# Patient Record
Sex: Female | Born: 1970 | Race: White | Hispanic: No | Marital: Married | Smoking: Former smoker
Health system: Southern US, Community
[De-identification: ages and names within clinical notes are randomized; demographics above are authoritative.]

## PROBLEM LIST (undated history)

## (undated) DIAGNOSIS — F419 Anxiety disorder, unspecified: Secondary | ICD-10-CM

## (undated) DIAGNOSIS — R682 Dry mouth, unspecified: Secondary | ICD-10-CM

## (undated) DIAGNOSIS — H04123 Dry eye syndrome of bilateral lacrimal glands: Secondary | ICD-10-CM

## (undated) DIAGNOSIS — K529 Noninfective gastroenteritis and colitis, unspecified: Secondary | ICD-10-CM

## (undated) DIAGNOSIS — M329 Systemic lupus erythematosus, unspecified: Secondary | ICD-10-CM

## (undated) DIAGNOSIS — I7300 Raynaud's syndrome without gangrene: Secondary | ICD-10-CM

## (undated) DIAGNOSIS — M6740 Ganglion, unspecified site: Secondary | ICD-10-CM

## (undated) DIAGNOSIS — G5600 Carpal tunnel syndrome, unspecified upper limb: Secondary | ICD-10-CM

## (undated) DIAGNOSIS — E785 Hyperlipidemia, unspecified: Secondary | ICD-10-CM

## (undated) DIAGNOSIS — D219 Benign neoplasm of connective and other soft tissue, unspecified: Secondary | ICD-10-CM

## (undated) DIAGNOSIS — F5002 Anorexia nervosa, binge eating/purging type: Secondary | ICD-10-CM

## (undated) DIAGNOSIS — Z9889 Other specified postprocedural states: Secondary | ICD-10-CM

## (undated) DIAGNOSIS — R112 Nausea with vomiting, unspecified: Secondary | ICD-10-CM

## (undated) DIAGNOSIS — F988 Other specified behavioral and emotional disorders with onset usually occurring in childhood and adolescence: Secondary | ICD-10-CM

## (undated) DIAGNOSIS — F32A Depression, unspecified: Secondary | ICD-10-CM

## (undated) DIAGNOSIS — IMO0002 Reserved for concepts with insufficient information to code with codable children: Secondary | ICD-10-CM

## (undated) DIAGNOSIS — H33319 Horseshoe tear of retina without detachment, unspecified eye: Secondary | ICD-10-CM

## (undated) DIAGNOSIS — S83209A Unspecified tear of unspecified meniscus, current injury, unspecified knee, initial encounter: Secondary | ICD-10-CM

## (undated) DIAGNOSIS — T7840XA Allergy, unspecified, initial encounter: Secondary | ICD-10-CM

## (undated) DIAGNOSIS — D649 Anemia, unspecified: Secondary | ICD-10-CM

## (undated) DIAGNOSIS — J45909 Unspecified asthma, uncomplicated: Secondary | ICD-10-CM

## (undated) DIAGNOSIS — F50029 Anorexia nervosa, binge eating/purging type, unspecified: Secondary | ICD-10-CM

## (undated) HISTORY — DX: Horseshoe tear of retina without detachment, unspecified eye: H33.319

## (undated) HISTORY — DX: Carpal tunnel syndrome, unspecified upper limb: G56.00

## (undated) HISTORY — DX: Unspecified asthma, uncomplicated: J45.909

## (undated) HISTORY — DX: Systemic lupus erythematosus, unspecified: M32.9

## (undated) HISTORY — DX: Unspecified tear of unspecified meniscus, current injury, unspecified knee, initial encounter: S83.209A

## (undated) HISTORY — DX: Dry eye syndrome of bilateral lacrimal glands: H04.123

## (undated) HISTORY — DX: Other specified behavioral and emotional disorders with onset usually occurring in childhood and adolescence: F98.8

## (undated) HISTORY — DX: Hyperlipidemia, unspecified: E78.5

## (undated) HISTORY — DX: Ganglion, unspecified site: M67.40

## (undated) HISTORY — DX: Reserved for concepts with insufficient information to code with codable children: IMO0002

## (undated) HISTORY — DX: Anorexia nervosa, binge eating/purging type, unspecified: F50.029

## (undated) HISTORY — DX: Noninfective gastroenteritis and colitis, unspecified: K52.9

## (undated) HISTORY — DX: Dry mouth, unspecified: R68.2

## (undated) HISTORY — DX: Benign neoplasm of connective and other soft tissue, unspecified: D21.9

## (undated) HISTORY — DX: Anemia, unspecified: D64.9

## (undated) HISTORY — DX: Allergy, unspecified, initial encounter: T78.40XA

## (undated) HISTORY — PX: DENTAL SURGERY: SHX609

## (undated) HISTORY — PX: OTHER SURGICAL HISTORY: SHX169

## (undated) HISTORY — PX: EYE SURGERY: SHX253

## (undated) HISTORY — PX: CARPAL TUNNEL RELEASE: SHX101

## (undated) HISTORY — DX: Anorexia nervosa, binge eating/purging type: F50.02

## (undated) HISTORY — DX: Anxiety disorder, unspecified: F41.9

---

## 1999-06-09 ENCOUNTER — Other Ambulatory Visit: Admission: RE | Admit: 1999-06-09 | Discharge: 1999-06-09 | Payer: Self-pay | Admitting: *Deleted

## 2000-12-06 ENCOUNTER — Other Ambulatory Visit: Admission: RE | Admit: 2000-12-06 | Discharge: 2000-12-06 | Payer: Self-pay | Admitting: Obstetrics and Gynecology

## 2001-04-16 ENCOUNTER — Other Ambulatory Visit: Admission: RE | Admit: 2001-04-16 | Discharge: 2001-04-16 | Payer: Self-pay | Admitting: Obstetrics and Gynecology

## 2001-08-06 ENCOUNTER — Inpatient Hospital Stay (HOSPITAL_COMMUNITY): Admission: AD | Admit: 2001-08-06 | Discharge: 2001-08-06 | Payer: Self-pay | Admitting: Obstetrics and Gynecology

## 2001-11-07 ENCOUNTER — Ambulatory Visit (HOSPITAL_COMMUNITY): Admission: AD | Admit: 2001-11-07 | Discharge: 2001-11-07 | Payer: Self-pay | Admitting: Obstetrics and Gynecology

## 2001-11-07 ENCOUNTER — Encounter: Payer: Self-pay | Admitting: Obstetrics and Gynecology

## 2001-11-08 ENCOUNTER — Inpatient Hospital Stay (HOSPITAL_COMMUNITY): Admission: AD | Admit: 2001-11-08 | Discharge: 2001-11-11 | Payer: Self-pay | Admitting: Obstetrics and Gynecology

## 2002-03-25 ENCOUNTER — Emergency Department (HOSPITAL_COMMUNITY): Admission: EM | Admit: 2002-03-25 | Discharge: 2002-03-25 | Payer: Self-pay

## 2002-05-06 ENCOUNTER — Other Ambulatory Visit: Admission: RE | Admit: 2002-05-06 | Discharge: 2002-05-06 | Payer: Self-pay | Admitting: Obstetrics and Gynecology

## 2003-06-18 ENCOUNTER — Other Ambulatory Visit: Admission: RE | Admit: 2003-06-18 | Discharge: 2003-06-18 | Payer: Self-pay | Admitting: *Deleted

## 2003-07-25 ENCOUNTER — Inpatient Hospital Stay (HOSPITAL_COMMUNITY): Admission: AD | Admit: 2003-07-25 | Discharge: 2003-07-25 | Payer: Self-pay | Admitting: Obstetrics and Gynecology

## 2003-08-02 ENCOUNTER — Observation Stay (HOSPITAL_COMMUNITY): Admission: AD | Admit: 2003-08-02 | Discharge: 2003-08-03 | Payer: Self-pay | Admitting: Obstetrics and Gynecology

## 2003-09-17 ENCOUNTER — Ambulatory Visit (HOSPITAL_COMMUNITY): Admission: RE | Admit: 2003-09-17 | Discharge: 2003-09-17 | Payer: Self-pay | Admitting: Obstetrics and Gynecology

## 2003-10-13 ENCOUNTER — Inpatient Hospital Stay (HOSPITAL_COMMUNITY): Admission: RE | Admit: 2003-10-13 | Discharge: 2003-10-16 | Payer: Self-pay | Admitting: Obstetrics and Gynecology

## 2005-05-25 ENCOUNTER — Emergency Department (HOSPITAL_COMMUNITY): Admission: EM | Admit: 2005-05-25 | Discharge: 2005-05-25 | Payer: Self-pay | Admitting: Emergency Medicine

## 2006-04-25 ENCOUNTER — Emergency Department (HOSPITAL_COMMUNITY): Admission: EM | Admit: 2006-04-25 | Discharge: 2006-04-25 | Payer: Self-pay | Admitting: Emergency Medicine

## 2007-09-13 HISTORY — PX: DENTAL SURGERY: SHX609

## 2011-01-28 NOTE — H&P (Signed)
NAME:  SATIVA, GELLES NO.:  [id number]   MEDICAL RECORD NO.:  [id number]                   PATIENT TYPE:  MAT   LOCATION:  MATC                                 FACILITY:  [facility]   PHYSICIAN:  [name], M.D.                DATE OF BIRTH:  1970/11/02   DATE OF ADMISSION:  08/02/2003  DATE OF DISCHARGE:                                HISTORY & PHYSICAL   HISTORY OF PRESENT ILLNESS:  Robin [name] is a 40 year old married white  female gravida 2, para 1-0-0-1 at [redacted] weeks gestation who presents for  evaluation secondary to falling in her garden when she tripped in a hole.  She landed on her right hip but twisted and injured her left ankle in the  fall. She reports crushing pain in her left ankle when she tries to bear  weight on that side. She denies any abdominal pain, leaking or vaginal  bleeding. She does report positive fetal movement. Her pregnancy has been  followed at [facility] by the  Certified Nurse Midwife Services and is complicated by:  1. History of previous cesarean section, desiring repeat.  2. History of macrosomia with her previous pregnancy and with this     pregnancy.  3. Rh negative, just having received RhoGAM last week.   OBSTETRICAL/GYNECOLOGIC HISTORY:  She is a gravida 2, para 1-0-0-1 with a  last menstrual period of January 09, 2003, giving her an [facility] of October 16, 2003 but by ultrasound, her [facility] is October 11, 2003. She did have a cesarean  section in February of 2003 for a viable female infant who weighed 10 pounds 6  ounces after failing to progress in labor and polyhydramnios. Her other GYN  history is significant only for ovarian cyst in 1993 and occasional yeast  infections.   ALLERGIES:  CECLOR (shortness of breath and a rash.   PAST MEDICAL HISTORY:  She reports having had the usual childhood diseases.  She reports a history of episodic hypertension as a child and she had  anemia  secondary to an eating disorder, also as a younger child. Occasional urinary  tract infection with a history of afebrile seizure as a child. Depression in  the past. Anorexia and boulimia in 1992.   PAST SURGICAL HISTORY:  Oral surgery of her gum in 2001. She fractured her  right hand in 1988. Her only hospitalization have been for childbirth.   FAMILY HISTORY:  Maternal grandparents and mother with angioplasty. Mother  with varicosities. Maternal aunt with ALS. Paternal grandmother with  Parkinson's. Mother with depression. Paternal great aunt with cleft lip.  Paternal grandfather with colon cancer. Her genetic history is otherwise  negative.   SOCIAL HISTORY:  She is married to Annie Paras, who is involved and  supportive. He is employed full-time as a Merchant navy officer. She is employed  full time as a Warehouse manager  coordinator. They are of the [place]. They  deny any illicit drug use, alcohol or smoking with this pregnancy.   PRENATAL LABORATORY DATA:  Blood type A negative. Antibody screen positive  today after receiving RhoGAM about a week ago. Her syphilis is negative. Her  Rubella titer is immune. Hepatitis B surface antigen is negative. Cystic  fibrosis is negative. GC and Chlamydia are both negative. Pap is within  normal limits. Her 1 hour Glucola was within normal range.   PHYSICAL EXAMINATION:  VITAL SIGNS:  Stable. She is afebrile.  HEENT:  Grossly within normal limits.  HEART:  Regular rate and rhythm.  CHEST:  Clear.  BREAST:  Soft and nontender.  ABDOMEN:  Gravid with very mild but persistent uterine irritability noted  that the patient is unaware of. Her fetal heart rate is reactive and  reassuring. Soft, nontender and gravid.  PELVIC:  Cervix is long, closed, posterior and high with no blood noted in  the vagina.  EXTREMITIES:  She has left ankle swelling on the lateral side with full  range of motion  but pain with weight bearing.   LABORATORY DATA:   X-ray of her left ankle is negative for fracture, positive  for soft tissue swelling, and a calcaneal plantar spur. Her KLB is negative.  Her antibody screen was positive, as previously mentioned.   ASSESSMENT:  1. Intrauterine pregnancy  at 30 weeks.  2. Status post fall.  3. Left ankle sprain.  4. Persistent uterine irritability.   PLAN:  Per consult with Dr. [name] is to admit for observation until  about 24 hours after the fall.     Salvatore Decent. Duplantis, C.N.M.              [name], M.D.    SJD/MEDQ  D:  08/02/2003  T:  08/03/2003  Job:  [id number]
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
## 2011-01-28 NOTE — Discharge Summary (Signed)
NAME:  Robin [name], Robin [name]                        ACCOUNT NO.:  [id number]   MEDICAL RECORD NO.:  [id number]                   PATIENT TYPE:  INP   LOCATION:  9133                                 FACILITY:  [facility]   PHYSICIAN:  Eli Hose, M.D.            DATE OF BIRTH:  Jun 03, 1971   DATE OF ADMISSION:  10/13/2003  DATE OF DISCHARGE:                                 DISCHARGE SUMMARY   ADMITTING DIAGNOSES:  1. Intrauterine pregnancy at 40 and two-sevenths weeks.  2. Previous cesarean section.  3. Suspected macrosomia and polyhydramnios.  4. Variable lie.   DISCHARGE DIAGNOSES:  1. Intrauterine pregnancy at 40 and two-sevenths weeks.  2. Previous cesarean section.  3. Suspected macrosomia and polyhydramnios.  4. Variable lie.  5. Carpal tunnel syndrome.   PROCEDURES:  Repeat low transverse cesarean section.   HOSPITAL COURSE:  Robin [name] is a 40 year old gravida 2 para 1-0-0-1 at 34  and two-sevenths weeks who presented on October 13, 2003 for repeat cesarean  section secondary to previous cesarean section, suspected macrosomia,  polyhydramnios, and desire for repeat.  The patient's pregnancy had been  remarkable for:  1. Previous cesarean section secondary to macrosomia with a 10-pound 6-ounce     infant.  2. History of macrosomia.  3. Rh negative.  4. History of depression and an eating disorder.  5. Closely-spaced pregnancy.   The patient was taken to the operating room where a repeat low transverse  cesarean section was performed by Dr. Katharine Look Rivard under spinal anesthesia.  Findings were a viable female by the name of [name], birth weight 9  pounds 11 ounces, Apgars were 9 and 9.  The patient tolerated the procedure  well and was taken to the recovery room in good condition.  Infant was taken  to the full-term nursery.  By postoperative day #1 the patient was doing  well.  She was up ad lib, she was breastfeeding.  She was undecided  regarding contraception.   Her hemoglobin was 10.0 - down from 10.8.  Incision was clean, dry, and intact.  Prescriptions for Motrin and Tylox  were given to the patient.  The rest of the patient's hospital course was  uncomplicated.  By postoperative day #3 she was still having some difficulty  with carpal tunnel.  She was slightly tearful but did desire to go home  today.  She denied any depression but was just fatigued and frustrated that  her carpal tunnel was still occurring despite all her efforts with fluids,  wrist braces, and p.o. pain medication.  Her incision was clean, dry, and  intact.  She did have subcuticular stitches in place.  Her stretch marks  above the incision were slightly red but the skin edges were intact.  Her  extremities showed 1+ edema in the lower extremities and edematous hands.  Her fundus was firm, her lochia was intact.  The rest of her physical  exam  was within normal limits.  She was deemed to have received the full benefit  of her hospital stay and was discharged home.   DISCHARGE INSTRUCTIONS:  Per [facility] handout.   DISCHARGE MEDICATIONS:  1. Motrin 600 mg p.o. q.6h. p.r.n. pain.  2. Tylox one to two p.o. q.3-4h. p.r.n. pain.  3. Prenatal vitamins one p.o. daily.   DISCHARGE FOLLOW-UP:  Will occur in 6 weeks at [facility] or p.r.n.  The patient currently declines referral to an orthopedist for further  evaluation of her carpal tunnel.     [name], C.N.M.                   Eli Hose, M.D.    [name]  D:  10/16/2003  T:  10/16/2003  Job:  [id number]

## 2011-01-28 NOTE — H&P (Signed)
NAME:  ESTIE, SPROULE                        ACCOUNT NO.:  [id number]   MEDICAL RECORD NO.:  [id number]                   PATIENT TYPE:  INP   LOCATION:  NA                                   FACILITY:  [facility]   PHYSICIAN:  Dede Query. Rivard, M.D.              DATE OF BIRTH:  01/26/1971   DATE OF ADMISSION:  DATE OF DISCHARGE:                                HISTORY & PHYSICAL   ANTICIPATED DATE OF ADMISSION:  October 13, 2003.   HISTORY OF PRESENT ILLNESS:  Ms. Nicole is a 40 year old gravida 2, para 1,  0, 0, 1 at 40-2/7ths weeks who presents today on October 13, 2003 scheduled  repeat cesarean section for macrosomia and previous cesarean section.  Pregnancy is remarkable for:  1. Previous cesarean section secondary to fetal macrosomia with weight 10     pounds 6 ounces.  2. Rh negative.  3. History of macrosomia.  4. History of depression and an eating disorder.  5. Closely spaced pregnancies.   PRENATAL LABORATORY DATA:  Blood type is A negative, Rh antibody negative.  VDRL nonreactive.  Rubella titer positive.  Hepatitis B surface antigen is  negative.  HIV was declined.  Cystic fibrosis testing was negative.  GC and  Chlamydia cultures were negative.  Pap was normal.  Glucose challenge was  normal.  AFP was declined.  hemoglobin upon entering the practice was 11.7  and it was 11.2 at 27 weeks.  Group B Strep culture was negative at 36  weeks.  GC and Chlamydia cultures were also negative at approximately 35-36  weeks.  EDC of October 11, 2003 was established by ultrasound at  approximately 12 weeks.   HISTORY OF PRESENT PREGNANCY:  The patient entered care at approximately 10  weeks.  She initially was undecided about VBAC versus C section.  She had  decided to proceed with the VBAC if the baby was normal size, but planned a  repeat C section if the baby was macrosomic.   Dictation ended at this point.     [name], C.N.M.                   Dede Query Rivard,  M.D.    [name]  D:  10/10/2003  T:  10/11/2003  Job:  [id number]
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
## 2011-01-28 NOTE — Discharge Summary (Signed)
[facility]  Patient:    Robin [name], Robin [name] Visit Number: [id number] MRN: [id number]          Service Type: OBS Location: 910A 9106 01 Attending Physician:  Alwyn Pea Dictated by:   Debby Freiberg, C.N.M. Admit Date:  11/08/2001 Discharge Date: 11/11/2001                             Discharge Summary  DATE OF BIRTH:  1971/05/07  ADMISSION DIAGNOSES: 1. Intrauterine pregnancy at term. 2. Polyhydramnios. 3. Suspected macrosomia. 4. Early labor.  PROCEDURE:  Primary low transverse cesarean section.  DISCHARGE DIAGNOSES: 1. Intrauterine pregnancy at term. 2. Polyhydramnios. 3. Suspected macrosomia. 4. Early labor.  HISTORY OF PRESENT ILLNESS:  Robin [name] is a 40 year old gravida 1, para 0, who presented at 39-6/7 weeks in early labor.  She had previously elected primary cesarean section due to polyhydramnios and suspected macrosomia, therefore, she underwent primary low transverse cesarean section by Dr. Katharine Look [name] with the birth of a 10 pound 6 ounce female infant named Robin [name], with Apgar scores of 8 at one minute and 9 at five minutes.  Both mother and baby have done well in the postoperative period.  The patients hemoglobin on the first postoperative day is 11.0.  Both she and baby are in stable condition, and on this her third postoperative day, she is judged to be in satisfactory condition for discharge.  DISCHARGE INSTRUCTIONS:  Per [facility] handout.  DISCHARGE MEDICATIONS: 1. Motrin 600 mg p.o. q.6h. p.r.n. pain. 2. Tylox one or two p.o. q.3-4h. p.r.n. pain. 3. Prenatal vitamins.  FOLLOWUP:  [facility] in six weeks. Dictated by:   Debby Freiberg, C.N.M. Attending Physician:  Alwyn Pea DD:  11/11/01 TD:  11/12/01 Job: [id number]

## 2011-01-28 NOTE — Discharge Summary (Signed)
NAMEMarland [name]  [name], [name] NO.:  [id number]   MEDICAL RECORD NO.:  [id number]                   PATIENT TYPE:  INP   LOCATION:  9167                                 FACILITY:  [facility]   PHYSICIAN:  [name], M.D.                DATE OF BIRTH:  September 02, 1971   DATE OF ADMISSION:  08/02/2003  DATE OF DISCHARGE:  08/03/2003                                 DISCHARGE SUMMARY   ADMITTING DIAGNOSES:  1. Intrauterine pregnancy at 30 weeks.  2. Status post fall.  3. Left ankle sprain.   DISCHARGE DIAGNOSES:  1. Intrauterine pregnancy at 30 weeks.  2. Status post fall.  3. Left ankle sprain.  4. Stable with no contractions.   HISTORY:  [name] is a 40 year old gravida 2, para 1-0-0-1 at 30 weeks  who presents for evaluation secondary to falling in her garden.  She landed  on her right hip and twisted her left ankle.  She had an x-ray of the left  ankle which was negative for fracture.  Positive SPF and calcaneal plantar  spur.  She initially was experiencing uterine irritability and was admitted  for 23-hour observation after her fall.  She remained stable through the  night.  Fetal heart rate was 130s-140s with positive variability, positive  accelerations, reactive with no periodic changes and toco showing only rare  contractions.  Per [name], M.D. examination today patient is stable  following her fall and is to be discharged home at 6 p.m.  Follow-up is  scheduled for August 14, 2003.  She was instructed on fetal kick counts and  to call if decrease in movement, contractions, abdominal pain, leaking of  fluid, or vaginal bleeding.  Her vital signs remained stable and fetal heart  rate has remained stable with no contractions.  She is therefore to be  discharged home with follow-up as above.  She received crutches to take home  with.  She declined physical therapy and an Ace bandage was applied to her  ankle.     Arlyn Leak, C.N.M.                [name], M.D.    SDM/MEDQ  D:  08/03/2003  T:  08/04/2003  Job:  [id number]

## 2011-01-28 NOTE — Op Note (Signed)
[facility]  Patient:    Robin [name], Robin [name] Visit Number: [id number] MRN: [id number]          Service Type: OBS Location: 910D 9126 01 Attending Physician:  Alwyn Pea Dictated by:   Delsa Bern, M.D. Admit Date:  11/08/2001                             Operative Report  PREOPERATIVE DIAGNOSES:       1. Intrauterine pregnancy at 39 weeks 6 days.                               2. Polyhydramnios.                               3. Suspected macrosomia.                               4. Early labor.  POSTOPERATIVE DIAGNOSES:      1. Intrauterine pregnancy at 39 weeks 6 days.                               2. Polyhydramnios.                               3. Confirmed macrosomia.                               4. Early labor.  PROCEDURE:                    Primary low transverse cesarean section.  SURGEON:                      Delsa Bern, M.D.  ASSISTANT:                    Joelene Millin D. Brigitte Pulse, C.N.M.  ANESTHESIA:                   Spinal.  ANESTHESIOLOGIST:             Lesleigh Noe., M.D.  ESTIMATED BLOOD LOSS:         500 cc.  DESCRIPTION OF PROCEDURE:     After being informed of the planned procedure with the possible complications including bleeding; infection; injury to bowel, bladder or ureters as well as uterine atony due to uterine overdistention which may require transfusion or surgical management, informed consent was obtained.  The patient was taken to OR #1 and given spinal anesthesia.  She was then placed in the dorsal decubitus position with the pelvis tilted to the left, prepped and draped in a sterile fashion and a Foley was inserted in her bladder without difficulty.  After assessing an adequate level of anesthesia, the skin was infiltrated with 10 cc of Marcaine 0.5%. The skin was incised with a knife in a Pfannenstiel fashion.  The fat and subcutaneous tissue were incised with a knife.  The fascia was incised in a low  transverse fashion, first with the knife and then with scissors.  The linea alba was dissected and the  peritoneum was entered in a midline fashion. The visceral peritoneum was then entered in a low transverse fashion, allowing [place] to develop a bladder flap and to safely retract the bladder for entry in the myometrium in a low transverse fashion, first with knife and then bluntly. Amniotic fluid was clear and abundant.  We assisted the birth of a female infant in OP presentation at 4:45.  The mouth and nose were suctioned.  The cord was clamped with two Kelly clamps and sectioned and the baby was given to the pediatrician present in the room.  A total of 20 cc of blood was drawn from the umbilical vein and the placenta was allowed to spontaneously deliver. Uterine revision was negative.  We then proceeded with uterine closure in two planes, first with running lock suture of 0 Vicryl, then with a Lembert suture of 0 Vicryl covering the first one.  Hemostasis was then assessed and adequate. Both pericolic gutters were cleansed.  Both ovaries and tubes assessed and normal.  We then irrigated with warm saline and reassessed hemostasis, which was still adequate.  Under fascia hemostasis was completed with cautery.  The fascia was closed with two running sutures of 0 Vicryl, meeting in the midline.  The wound was then infiltrated with warm saline. Hemostasis was completed with cautery and the skin was closed with staples. Sponge and instrument counts were complete x 2.  Estimated blood loss was 500 cc.  The baby received Apgars of 8 at one minute and 9 at five minutes and weighed 10 lb 6 oz.  The procedure was very well tolerated by the patient, who was taken to the recovery room in a well and stable condition. Dictated by:   Delsa Bern, M.D. Attending Physician:  Alwyn Pea DD:  11/08/01 TD:  11/08/01 Job: [id number]

## 2011-01-28 NOTE — Op Note (Signed)
NAME:  Robin [name], Robin [name]                        ACCOUNT NO.:  [id number]   MEDICAL RECORD NO.:  [id number]                   PATIENT TYPE:  INP   LOCATION:  9133                                 FACILITY:  [facility]   PHYSICIAN:  Dede Query. Rivard, M.D.              DATE OF BIRTH:  12-31-1970   DATE OF PROCEDURE:  10/13/2003  DATE OF DISCHARGE:                                 OPERATIVE REPORT   PREOPERATIVE DIAGNOSES:  1. Intrauterine pregnancy at 40 weeks and two days.  2. Previous cesarean section.  3. Polyhydramnios.  4. Suspected macrosomia.  5. Variable lie.   POSTOPERATIVE DIAGNOSES:  1. Intrauterine pregnancy at 40 weeks and two days.  2. Previous cesarean section.  3. Polyhydramnios.  4. Suspected macrosomia.  5. Variable lie.   ANESTHESIA:  Spinal per Dr. Glennon Mac.   PROCEDURE:  Repeat low transverse cesarean section.   SURGEON:  Dede Query. Rivard, M.D.   ASSISTANTCathlean Marseilles. Latham, C.N.M.   ESTIMATED BLOOD LOSS:  600 mL.   DESCRIPTION OF PROCEDURE:  After being informed of the planned procedure  with possible complications including bleeding, infection, injury to bowels,  bladder, or ureters, and need for a repeat cesarean section from now on,  informed consent is obtained.  The patient is taken to cesarean suite, given  spinal anesthesia without any complications, placed in a dorsal decubitus  position, pelvis tilted to the left, prepped and draped in a sterile fashion  and a Foley catheter was inserted in her bladder.   After assessing adequate level of anesthesia, the previous incision was  infiltrated with 20 mL of Marcaine 0.25 and we proceeded with a Pfannenstiel  incision which was brought down to the fascia.  Fascia is incised in the low  transverse fashion.  Linea alba is dissected.  Peritoneum is entered in the  midline fashion.  Visceroperitoneum is entered in the low transverse fashion  allowing [place] to safely retract bladder by developing a bladder flap.  Myometrium is then entered in the low transverse fashion first with knife  then bluntly.  Amniotic fluid is clear and abundant.  We assist the birth of  a little girl in right oblique converted to vertex presentation at 9:23 a.m.  Mouth and nose were suctioned.  Cord was clamped with two Kelly clamps and  the baby was given to the pediatrician present in the room.  There were 20  mL of blood drawn from the umbilical vein and the placenta was allowed to  deliver spontaneously.  It was complete, cord had three vessels and uterine  revision was completely negative.   We proceeded with systematic closure, first with closing the myometrium in  two layers, first with a running locked suture of 0 Vicryl, then with a  Lembert suture of 0 Vicryl covering the first one.  Hemostasis was assessed  and adequate.  Both pericolic gutters were cleansed.  Both tubes  and ovaries  assessed and normal.  The pelvis was irrigated with warm saline.  Hemostasis  was reassessed and adequate.   On the fascia, hemostasis was completed with cautery.  Fascia was closed  with two running sutures of 0 Vicryl meeting midline.  Skin incision was  irrigated with warm saline.  Hemostasis was completed with cautery and skin  was closed with a subcuticular suture of 3-0 Monocryl and Steri-Strips.   Instruments and sponge counts were complete x2.   ESTIMATED BLOOD LOSS:  600 mL.   The procedure is well tolerated by the patient who is taken to the recovery  room in a well and stable condition.   A little girl named [name] was born at 9:23 and weight 9 pounds 11  ounces and received an Apgar of 9 at one minute and 9 at five minutes.                                               Dede Query Rivard, M.D.    SAR/MEDQ  D:  10/13/2003  T:  10/13/2003  Job:  [id number]

## 2011-01-28 NOTE — H&P (Signed)
[facility]  Patient:    Robin [name], MIMS Visit Number: [id number] MRN: [id number]          Service Type: Attending:  Delsa Bern, M.D. Dictated by:   Delsa Bern, M.D. Adm. Date:  11/09/01                           History and Physical  DATE OF BIRTH:                July 21, 1971  REASON FOR ADMISSION:         Intrauterine pregnancy at 40 weeks with polyhydramnios and suspected macrosomia to undergo primary elective cesarean section.  HISTORY OF PRESENT ILLNESS:   This is a 40 year old married white female gravida 1 with a due date of November 07, 2001 who has been followed for polyhydramnios for the last two weeks.  On November 07, 2001 she underwent a repeat ultrasound at [facility] which revealed an amniotic fluid index of 26.4 above the 97th percentile, but also an estimated fetal weight of 11 pounds 12 ounces or 5351 g which is also above 97th percentile for 40 weeks. Placenta is anterior, normally located grade II and baby is in vertex presentation.  She today reports good fetal activity, denies any bleeding, denies any regular contractions even though the frequency has increased.  She reports no leaking of water and denies any symptoms of pregnancy induced hypertension.  She was seen today to review the ultrasound results and a long discussion about the risks of shoulder dystocia associated with suspected macrosomia took place with patient and husband.  We did discuss that weight evaluation on ultrasound was not precise and typically varies from 10-15% of current weight which would still lead to a suspected macrosomic baby with the risk of shoulder dystocia at the time of delivery.  We also reviewed the unexplained polyhydramnios.  Because of these findings, primary elective cesarean section was recommended.  PRENATAL COURSE:              Blood type A-.  RPR nonreactive.  Rubella immune.  HBSAG negative.  Gonorrhea, chlamydia  negative.  HIV declined.  A 16 week AFP declined.  A 17 1/2 week ultrasound revealing a normal anatomy which was somewhat limited by fetal position with a normal placenta and normal amniotic fluid.  This ultrasound was repeated at 21 1/2 weeks which completed anatomy survey which was normal.  Placenta was anterior and normally inserted. Cervical length was 4.2 cm.  Her 26 week glucola was within normal limits. Her 35 week group B Strep was negative.  An ultrasound performed at 39 1/2 weeks had an estimated fetal weight if 3852 g or 72nd percentile and an amniotic fluid index of 26.1.  Her prenatal course was otherwise uneventful.  PAST MEDICAL HISTORY:         History of depression and eating disorder.  FAMILY HISTORY:               Mother with depression and cardiovascular disease.  SOCIAL HISTORY:               Married.  Nonsmoker.  Works as a Secretary/administrator.  ALLERGIES:                    CECLOR and DERIVATIVES.  PHYSICAL EXAMINATION  VITAL SIGNS:  Blood pressure 138/80.  HEENT:                        Negative.  LUNGS:                        Clear.  HEART:                        Normal.  ABDOMEN:                      Gravid.  Fundal height at 48 cm.  Vertex presentation.  Fetal heart tones at 128 beats per minute.  EXTREMITIES:                  Negative.  PELVIC:                       Vaginal examination is 1 cm, 70% effaced, vertex, -3.  ASSESSMENT:                   Intrauterine pregnancy at 40 weeks and 2 days with polyhydramnios and suspected macrosomia.  PLAN:                         Patient is admitted to undergo primary elective cesarean section.  Procedure has been well reviewed with patient and husband as well as possible complications including bleeding, infection, injury to bowels, bladder, and ureters, hospital stay, and recovery as well as uterine over distention with the increased risk of uterine atony which could lead to hemorrhage  and may require transfusion or surgical intervention for correction.  Informed consent is obtained. Dictated by:   Delsa Bern, M.D. Attending:  Delsa Bern, M.D. DD:  11/07/01 TD:  11/07/01 Job: [id number]

## 2011-08-24 ENCOUNTER — Ambulatory Visit: Payer: Self-pay | Admitting: Physician Assistant

## 2011-09-28 ENCOUNTER — Encounter (INDEPENDENT_AMBULATORY_CARE_PROVIDER_SITE_OTHER): Payer: BC Managed Care – PPO | Admitting: Physician Assistant

## 2011-09-28 ENCOUNTER — Encounter: Payer: Self-pay | Admitting: Family Medicine

## 2011-09-28 DIAGNOSIS — E785 Hyperlipidemia, unspecified: Secondary | ICD-10-CM | POA: Insufficient documentation

## 2011-09-28 DIAGNOSIS — I7300 Raynaud's syndrome without gangrene: Secondary | ICD-10-CM

## 2011-09-28 DIAGNOSIS — Z0000 Encounter for general adult medical examination without abnormal findings: Secondary | ICD-10-CM

## 2011-09-28 DIAGNOSIS — G5600 Carpal tunnel syndrome, unspecified upper limb: Secondary | ICD-10-CM

## 2011-09-28 DIAGNOSIS — J129 Viral pneumonia, unspecified: Secondary | ICD-10-CM

## 2011-09-28 DIAGNOSIS — E7800 Pure hypercholesterolemia, unspecified: Secondary | ICD-10-CM

## 2011-09-28 DIAGNOSIS — Z23 Encounter for immunization: Secondary | ICD-10-CM

## 2011-10-08 ENCOUNTER — Ambulatory Visit (INDEPENDENT_AMBULATORY_CARE_PROVIDER_SITE_OTHER): Payer: BC Managed Care – PPO

## 2011-10-08 DIAGNOSIS — R05 Cough: Secondary | ICD-10-CM

## 2011-10-08 DIAGNOSIS — R059 Cough, unspecified: Secondary | ICD-10-CM

## 2011-10-08 DIAGNOSIS — J9801 Acute bronchospasm: Secondary | ICD-10-CM

## 2012-05-27 ENCOUNTER — Ambulatory Visit: Payer: BC Managed Care – PPO

## 2012-05-27 ENCOUNTER — Ambulatory Visit (INDEPENDENT_AMBULATORY_CARE_PROVIDER_SITE_OTHER): Payer: BC Managed Care – PPO | Admitting: Emergency Medicine

## 2012-05-27 VITALS — BP 107/64 | HR 71 | Temp 98.4°F | Resp 16 | Ht 65.5 in | Wt 152.0 lb

## 2012-05-27 DIAGNOSIS — M79609 Pain in unspecified limb: Secondary | ICD-10-CM

## 2012-05-27 DIAGNOSIS — M79672 Pain in left foot: Secondary | ICD-10-CM

## 2012-05-27 MED ORDER — RANITIDINE HCL 300 MG PO TABS: 300.0000 mg | ORAL_TABLET | Freq: Every day | ORAL | Status: DC

## 2012-05-27 MED ORDER — NAPROXEN 500 MG PO TABS: 500.0000 mg | ORAL_TABLET | Freq: Two times a day (BID) | ORAL | Status: AC

## 2012-05-27 NOTE — Progress Notes (Signed)
  Subjective:    Patient ID: Robin [name], female    DOB: Jul 24, 1971, 41 y.o.   MRN: [id number]  HPI41 yr old CF presents with acute onset L lateral foot pain today when running.  She has been training for a half-marathon since May.  She bought new shoes then and has been wearing those for running only for ~20 miles per week.  She has been a runner now for about 2 years. NKI. Pain is worse bare-footed or with flip-flops.  Review of Systems  All other systems reviewed and are negative.       Objective:   Physical Exam  Nursing note and vitals reviewed. Constitutional: She appears well-developed and well-nourished.  Musculoskeletal:       Feet:      [facility] reading (PRIMARY) by  Dr. Ouida Sills as NAD.      Assessment & Plan:  Foot pain-likely ligamentous/tendon/faschia-post-op shoe.  See meds. Ice massage. Call if not improving and will refer to Dr. Rip Harbour so they can U/S for stress fracture-clinically I doubt there is at this point.

## 2013-07-12 ENCOUNTER — Ambulatory Visit: Payer: BC Managed Care – PPO | Admitting: Family Medicine

## 2013-10-17 ENCOUNTER — Ambulatory Visit (INDEPENDENT_AMBULATORY_CARE_PROVIDER_SITE_OTHER): Payer: BC Managed Care – PPO | Admitting: Physician Assistant

## 2013-10-17 ENCOUNTER — Encounter: Payer: Self-pay | Admitting: Physician Assistant

## 2013-10-17 VITALS — BP 106/76 | HR 55 | Temp 98.4°F | Resp 16 | Ht 66.0 in | Wt 150.0 lb

## 2013-10-17 DIAGNOSIS — R3989 Other symptoms and signs involving the genitourinary system: Secondary | ICD-10-CM

## 2013-10-17 DIAGNOSIS — I7300 Raynaud's syndrome without gangrene: Secondary | ICD-10-CM

## 2013-10-17 DIAGNOSIS — K5909 Other constipation: Secondary | ICD-10-CM

## 2013-10-17 DIAGNOSIS — K5900 Constipation, unspecified: Secondary | ICD-10-CM

## 2013-10-17 DIAGNOSIS — Z124 Encounter for screening for malignant neoplasm of cervix: Secondary | ICD-10-CM

## 2013-10-17 DIAGNOSIS — E785 Hyperlipidemia, unspecified: Secondary | ICD-10-CM

## 2013-10-17 DIAGNOSIS — K1230 Oral mucositis (ulcerative), unspecified: Secondary | ICD-10-CM

## 2013-10-17 DIAGNOSIS — K121 Other forms of stomatitis: Secondary | ICD-10-CM

## 2013-10-17 DIAGNOSIS — R350 Frequency of micturition: Secondary | ICD-10-CM

## 2013-10-17 DIAGNOSIS — R41840 Attention and concentration deficit: Secondary | ICD-10-CM

## 2013-10-17 DIAGNOSIS — Z1211 Encounter for screening for malignant neoplasm of colon: Secondary | ICD-10-CM

## 2013-10-17 DIAGNOSIS — R6889 Other general symptoms and signs: Secondary | ICD-10-CM

## 2013-10-17 DIAGNOSIS — Z0000 Encounter for general adult medical examination without abnormal findings: Secondary | ICD-10-CM

## 2013-10-17 LAB — POCT URINALYSIS DIPSTICK
Bilirubin, UA: NEGATIVE
Blood, UA: NEGATIVE
Glucose, UA: NEGATIVE
Ketones, UA: NEGATIVE
Leukocytes, UA: NEGATIVE
Nitrite, UA: NEGATIVE
Protein, UA: NEGATIVE
Spec Grav, UA: 1.01
Urobilinogen, UA: 0.2
pH, UA: 6.5

## 2013-10-17 LAB — CBC WITH DIFFERENTIAL/PLATELET
Basophils Absolute: 0 10*3/uL (ref 0.0–0.1)
Basophils Relative: 0 % (ref 0–1)
Eosinophils Absolute: 0.2 10*3/uL (ref 0.0–0.7)
Eosinophils Relative: 3 % (ref 0–5)
HCT: 41.1 % (ref 36.0–46.0)
Hemoglobin: 14.1 g/dL (ref 12.0–15.0)
Lymphocytes Relative: 36 % (ref 12–46)
Lymphs Abs: 2.4 10*3/uL (ref 0.7–4.0)
MCH: 31.2 pg (ref 26.0–34.0)
MCHC: 34.3 g/dL (ref 30.0–36.0)
MCV: 90.9 fL (ref 78.0–100.0)
Monocytes Absolute: 0.6 10*3/uL (ref 0.1–1.0)
Monocytes Relative: 9 % (ref 3–12)
Neutro Abs: 3.5 10*3/uL (ref 1.7–7.7)
Neutrophils Relative %: 52 % (ref 43–77)
Platelets: 264 10*3/uL (ref 150–400)
RBC: 4.52 MIL/uL (ref 3.87–5.11)
RDW: 13.6 % (ref 11.5–15.5)
WBC: 6.8 10*3/uL (ref 4.0–10.5)

## 2013-10-17 LAB — POCT UA - MICROSCOPIC ONLY
Bacteria, U Microscopic: NEGATIVE
Casts, Ur, LPF, POC: NEGATIVE
Crystals, Ur, HPF, POC: NEGATIVE
Mucus, UA: NEGATIVE
RBC, urine, microscopic: NEGATIVE
WBC, Ur, HPF, POC: NEGATIVE
Yeast, UA: NEGATIVE

## 2013-10-17 LAB — GLUCOSE, POCT (MANUAL RESULT ENTRY): POC Glucose: 75 mg/dl (ref 70–99)

## 2013-10-17 MED ORDER — AMPHETAMINE-DEXTROAMPHETAMINE 10 MG PO TABS: 5.0000 mg | ORAL_TABLET | Freq: Two times a day (BID) | ORAL | Status: DC

## 2013-10-17 NOTE — Progress Notes (Signed)
Subjective:    Patient ID: Robin [name], female    DOB: 07/10/1971, 43 y.o.   MRN: [id number]  HPI  Robin [name] is a 43 year old female who present for CPE.   She gets thrush on a fairly regular basis - 5 times over past years. Usually occurs right before her period. Treats it by swishing diluted hydrogen peroxide and scrubbing tongue with hydrogen peroxide. She also notes having a couple of mouth ulcers over the past year that resolve on their own. She believes this is related to using certain types of tooth paste.   Patient reports having chronic constipation. She takes a probiotic and eats a high fiber diet. Uses Citracel when constipation seems to worsens which helps.   Reports cold intolerance that has significantly gotten worse this year. Fingers and toes will turn blue to "yellow" when she runs in the cold - they will also tingle and burn. When blood returns to fingers and toes, they turn "fire red" and are swollen.   Has experienced urinary frequency and urgency since her pregnancies.   She has recently started a new job that is very demanding. Reports getting off task easily and is difficult to manage tasks effectively. Time management is very challenging. Would like to be evaluated for ADD.  She reports saying a wrong word when she means a different word. This is worsened over the past year.   Has felt slightly stressed and anxious about starting new job. However, she feels that she is able to manage anxiety well and reports "it is not out of control".   Review of Systems  HENT: Positive for mouth sores.   Eyes: Negative.   Respiratory: Negative.   Cardiovascular: Negative.   Gastrointestinal: Positive for constipation.  Endocrine: Positive for cold intolerance.  Genitourinary: Positive for urgency and frequency.  Musculoskeletal: Negative.   Skin: Negative.   Allergic/Immunologic: Positive for environmental allergies and food allergies.  Neurological: Positive  for speech difficulty.  Hematological: Negative.        Objective:   Physical Exam  Constitutional: She is oriented to person, place, and time. Vital signs are normal. She appears well-developed and well-nourished.  HENT:  Head: Normocephalic and atraumatic.  Right Ear: Hearing, external ear and ear canal normal. Tympanic membrane is erythematous.  Left Ear: Hearing, tympanic membrane, external ear and ear canal normal.  Nose: Rhinorrhea present. No mucosal edema.  Mouth/Throat: Uvula is midline, oropharynx is clear and moist and mucous membranes are normal.  Eyes: Conjunctivae, EOM and lids are normal. Pupils are equal, round, and reactive to light.  Neck: Normal range of motion. Neck supple. No thyromegaly present.  Cardiovascular: Normal rate, regular rhythm, normal heart sounds and intact distal pulses.   Pulmonary/Chest: Effort normal and breath sounds normal. Right breast exhibits no mass and no tenderness. Left breast exhibits no mass and no tenderness.  Abdominal: Soft. Normal appearance. There is no tenderness.  Genitourinary: Vagina normal. No breast swelling or tenderness. There is no rash, tenderness or lesion on the right labia. There is no rash, tenderness or lesion on the left labia.  Musculoskeletal: Normal range of motion.  Lymphadenopathy:       Head (right side): No submental, no submandibular, no tonsillar, no preauricular, no posterior auricular and no occipital adenopathy present.       Head (left side): No submental, no submandibular, no tonsillar, no preauricular, no posterior auricular and no occipital adenopathy present.    She has no cervical  adenopathy.       Right: No inguinal and no supraclavicular adenopathy present.       Left: No inguinal and no supraclavicular adenopathy present.  Neurological: She is alert and oriented to person, place, and time. She has normal strength and normal reflexes. No cranial nerve deficit or sensory deficit. Coordination normal.   Skin: Skin is warm and dry.  Psychiatric: She has a normal mood and affect. Her behavior is normal. Judgment and thought content normal.   Results for orders placed in visit on 10/17/13  POCT UA - MICROSCOPIC ONLY      Result Value Range   WBC, Ur, HPF, POC neg     RBC, urine, microscopic neg     Bacteria, U Microscopic neg     Mucus, UA neg     Epithelial cells, urine per micros 0-1     Crystals, Ur, HPF, POC neg     Casts, Ur, LPF, POC neg     Yeast, UA neg    POCT URINALYSIS DIPSTICK      Result Value Range   Color, UA lt yellow     Clarity, UA clear     Glucose, UA neg     Bilirubin, UA neg     Ketones, UA neg     Spec Grav, UA 1.010     Blood, UA neg     pH, UA 6.5     Protein, UA neg     Urobilinogen, UA 0.2     Nitrite, UA neg     Leukocytes, UA Negative    GLUCOSE, POCT (MANUAL RESULT ENTRY)      Result Value Range   POC Glucose 75  70 - 99 mg/dl       Assessment & Plan:   1. Routine general medical examination at a health care facility Age appropriate health guidance provided.   2. Raynaud's phenomenon Due to patients lower side of normal BP, will hold off on starting CCB today. Encouraged patient to use gloves and thick socks when running and in other cold areas. May also use hand warmers with gloves.   3. Constipation, chronic Continue to use probiotic. Use Miralax daily. Increase fluids and fiber in diet. Continue regular exercise.  - Comprehensive metabolic panel - TSH  4. Urinary frequency Possibly due to chronic constipation. Will work to alleviate constipation and if no improvement in frequency, will refer to therapy for Kegel exercises.  - POCT UA - Microscopic Only - POCT urinalysis dipstick - POCT glucose (manual entry)  5. Stomatitis Glucose WNL. Will call with results of HIV antibody. Patient told to come in for evaluation next time she has an episode.  - CBC with Differential - POCT glucose (manual entry) - HIV antibody  6. Screening  for cervical cancer - Pap IG and HPV (high risk) DNA detection  7. Disturbed concentration Begin taking Adderall 5 mg in morning and 5 mg in afternoon. May increase to 2 tablets daily as needed.  - TSH - amphetamine-dextroamphetamine (ADDERALL) 10 MG tablet; Take 0.5-1 tablets (5-10 mg total) by mouth 2 (two) times daily with a meal.  Dispense: 30 tablet; Refill: 0  8. Hyperlipidemia Continue fish oil for hyperlipidemia. Will call with lab results.  - Lipid panel

## 2013-10-17 NOTE — Patient Instructions (Signed)
May use Miralax daily for constipation. Increase fluids and fiber in diet. Continue to exercise regularly.   Keeping You Healthy  Get These Tests 1. Blood Pressure- Have your blood pressure checked once a year by your health care provider.  Normal blood pressure is 120/80. 2. Weight- Have your body mass index (BMI) calculated to screen for obesity.  BMI is measure of body fat based on height and weight.  You can also calculate your own BMI at GravelBags.it. 3. Cholesterol- Have your cholesterol checked every 5 years starting at age 41 then yearly starting at age 33. 91. Chlamydia, HIV, and other sexually transmitted diseases- Get screened every year until age 41, then within three months of each new sexual provider. 5. Pap Smear- Every 1-3 years; discuss with your health care provider. 6. Mammogram- Every year starting at age 41  Take these medicines  Calcium with Vitamin D-Your body needs 1200 mg of Calcium each day and [phone] IU of Vitamin D daily.  Your body can only absorb 500 mg of Calcium at a time so Calcium must be taken in 2 or 3 divided doses throughout the day.  Multivitamin with folic acid- Once daily if it is possible for you to become pregnant.  Get these Immunizations  Gardasil-Series of three doses; prevents HPV related illness such as genital warts and cervical cancer.  Menactra-Single dose; prevents meningitis.  Tetanus shot- Every 10 years.  Flu shot-Every year.  Take these steps 1. Do not smoke-Your healthcare provider can help you quit.  For tips on how to quit go to www.smokefree.gov or call 1-800 QUITNOW. 2. Be physically active- Exercise 5 days a week for at least 30 minutes.  If you are not already physically active, start slow and gradually work up to 30 minutes of moderate physical activity.  Examples of moderate activity include walking briskly, dancing, swimming, bicycling, etc. 3. Breast Cancer- A self breast exam every month is important for  early detection of breast cancer.  For more information and instruction on self breast exams, ask your healthcare provider or https://www.patel.info/. 4. Eat a healthy diet- Eat a variety of healthy foods such as fruits, vegetables, whole grains, low fat milk, low fat cheeses, yogurt, lean meats, poultry and fish, beans, nuts, tofu, etc.  For more information go to www. Thenutritionsource.org 5. Drink alcohol in moderation- Limit alcohol intake to one drink or less per day. Never drink and drive. 6. Depression- Your emotional health is as important as your physical health.  If you're feeling down or losing interest in things you normally enjoy please talk to your healthcare provider about being screened for depression. 7. Dental visit- Brush and floss your teeth twice daily; visit your dentist twice a year. 8. Eye doctor- Get an eye exam at least every 2 years. 9. Helmet use- Always wear a helmet when riding a bicycle, motorcycle, rollerblading or skateboarding. 43. Safe sex- If you may be exposed to sexually transmitted infections, use a condom. 11. Seat belts- Seat belts can save your live; always wear one. 12. Smoke/Carbon Monoxide detectors- These detectors need to be installed on the appropriate level of your home. Replace batteries at least once a year. 13. Skin cancer- When out in the sun please cover up and use sunscreen 15 SPF or higher. 14. Violence- If anyone is threatening or hurting you, please tell your healthcare provider.

## 2013-10-18 LAB — COMPREHENSIVE METABOLIC PANEL
ALT: 16 U/L (ref 0–35)
AST: 18 U/L (ref 0–37)
Albumin: 5.2 g/dL (ref 3.5–5.2)
Alkaline Phosphatase: 27 U/L — ABNORMAL LOW (ref 39–117)
BUN: 12 mg/dL (ref 6–23)
CO2: 27 mEq/L (ref 19–32)
Calcium: 9.7 mg/dL (ref 8.4–10.5)
Chloride: 102 mEq/L (ref 96–112)
Creat: 0.77 mg/dL (ref 0.50–1.10)
Glucose, Bld: 81 mg/dL (ref 70–99)
Potassium: 3.8 mEq/L (ref 3.5–5.3)
Sodium: 138 mEq/L (ref 135–145)
Total Bilirubin: 0.7 mg/dL (ref 0.2–1.2)
Total Protein: 7.7 g/dL (ref 6.0–8.3)

## 2013-10-18 LAB — TSH: TSH: 2.086 u[IU]/mL (ref 0.350–4.500)

## 2013-10-18 LAB — HIV ANTIBODY (ROUTINE TESTING W REFLEX): HIV: NONREACTIVE

## 2013-10-18 LAB — LIPID PANEL
Cholesterol: 246 mg/dL — ABNORMAL HIGH (ref 0–200)
HDL: 73 mg/dL (ref >39)
LDL Cholesterol: 146 mg/dL — ABNORMAL HIGH (ref 0–99)
Total CHOL/HDL Ratio: 3.4 Ratio
Triglycerides: 137 mg/dL (ref <150)
VLDL: 27 mg/dL (ref 0–40)

## 2013-10-18 NOTE — Progress Notes (Signed)
I have examined this patient along with the student and agree.  

## 2013-10-21 ENCOUNTER — Encounter: Payer: Self-pay | Admitting: Physician Assistant

## 2013-10-21 LAB — PAP IG AND HPV HIGH-RISK: HPV DNA High Risk: NOT DETECTED

## 2013-11-22 ENCOUNTER — Telehealth: Payer: Self-pay

## 2013-11-22 DIAGNOSIS — R41840 Attention and concentration deficit: Secondary | ICD-10-CM

## 2013-11-22 MED ORDER — AMPHETAMINE-DEXTROAMPHETAMINE 10 MG PO TABS: 5.0000 mg | ORAL_TABLET | Freq: Two times a day (BID) | ORAL | Status: DC

## 2013-11-22 NOTE — Telephone Encounter (Signed)
Meds ordered this encounter  Medications  . amphetamine-dextroamphetamine (ADDERALL) 10 MG tablet    Sig: Take 0.5-1 tablets (5-10 mg total) by mouth 2 (two) times daily with a meal.    Dispense:  30 tablet    Refill:  0    Order Specific Question:  Supervising Provider    Answer:  DOOLITTLE, ROBERT P [[id number]

## 2013-11-22 NOTE — Telephone Encounter (Signed)
Pt needs a refill of adderall.  Please call [phone]

## 2013-11-25 NOTE — Telephone Encounter (Signed)
Notified pt Rx ready for p/up

## 2014-01-03 ENCOUNTER — Telehealth: Payer: Self-pay

## 2014-01-03 DIAGNOSIS — R41840 Attention and concentration deficit: Secondary | ICD-10-CM

## 2014-01-03 MED ORDER — AMPHETAMINE-DEXTROAMPHETAMINE 10 MG PO TABS: 5.0000 mg | ORAL_TABLET | Freq: Two times a day (BID) | ORAL | Status: DC

## 2014-01-03 NOTE — Telephone Encounter (Signed)
ADDERALL REFILL  [phone]

## 2014-01-03 NOTE — Telephone Encounter (Signed)
Meds ordered this encounter  Medications  . amphetamine-dextroamphetamine (ADDERALL) 10 MG tablet    Sig: Take 0.5-1 tablets (5-10 mg total) by mouth 2 (two) times daily with a meal.    Dispense:  30 tablet    Refill:  0    Order Specific Question:  Supervising Provider    Answer:  DOOLITTLE, ROBERT P [[id number]

## 2014-01-03 NOTE — Telephone Encounter (Signed)
Called pt to notify ready.

## 2014-02-05 ENCOUNTER — Telehealth: Payer: Self-pay

## 2014-02-05 DIAGNOSIS — R41840 Attention and concentration deficit: Secondary | ICD-10-CM

## 2014-02-05 NOTE — Telephone Encounter (Signed)
Pt needs refill of adderall.[phone]

## 2014-02-06 MED ORDER — AMPHETAMINE-DEXTROAMPHETAMINE 10 MG PO TABS: 5.0000 mg | ORAL_TABLET | Freq: Two times a day (BID) | ORAL | Status: DC

## 2014-02-06 NOTE — Telephone Encounter (Signed)
Notified pt ready. 

## 2014-02-06 NOTE — Telephone Encounter (Signed)
Rx printed.  Meds ordered this encounter  Medications  . amphetamine-dextroamphetamine (ADDERALL) 10 MG tablet    Sig: Take 0.5-1 tablets (5-10 mg total) by mouth 2 (two) times daily with a meal.    Dispense:  30 tablet    Refill:  0    Order Specific Question:  Supervising Provider    Answer:  DOOLITTLE, ROBERT P [[id number]

## 2014-03-12 ENCOUNTER — Telehealth: Payer: Self-pay

## 2014-03-12 DIAGNOSIS — R41840 Attention and concentration deficit: Secondary | ICD-10-CM

## 2014-03-12 NOTE — Telephone Encounter (Signed)
Refill amphetamine-dextroamphetamine (ADDERALL); advised to allow 24-48 hours

## 2014-03-13 MED ORDER — AMPHETAMINE-DEXTROAMPHETAMINE 10 MG PO TABS: 5.0000 mg | ORAL_TABLET | Freq: Two times a day (BID) | ORAL | Status: DC

## 2014-03-13 NOTE — Telephone Encounter (Signed)
rx printed.  Meds ordered this encounter  Medications  . amphetamine-dextroamphetamine (ADDERALL) 10 MG tablet    Sig: Take 0.5-1 tablets (5-10 mg total) by mouth 2 (two) times daily with a meal.    Dispense:  30 tablet    Refill:  0    Order Specific Question:  Supervising Provider    Answer:  DOOLITTLE, ROBERT P [[id number]]

## 2014-03-14 NOTE — Telephone Encounter (Signed)
Pt notified that rx is up front for p/u

## 2014-04-09 ENCOUNTER — Telehealth: Payer: Self-pay

## 2014-04-09 DIAGNOSIS — R41840 Attention and concentration deficit: Secondary | ICD-10-CM

## 2014-04-09 NOTE — Telephone Encounter (Signed)
PT IN NEED OF HER ADDERALL 10MGS PLEASE CALL [phone] WHEN READY FOR PICK UP

## 2014-04-18 MED ORDER — AMPHETAMINE-DEXTROAMPHETAMINE 10 MG PO TABS: 5.0000 mg | ORAL_TABLET | Freq: Two times a day (BID) | ORAL | Status: DC

## 2014-04-18 NOTE — Telephone Encounter (Signed)
Please advise 

## 2014-04-18 NOTE — Telephone Encounter (Signed)
Rx printed

## 2014-04-19 NOTE — Telephone Encounter (Signed)
Pt notified that it is up front for p/u

## 2014-05-12 ENCOUNTER — Encounter: Payer: Self-pay | Admitting: Family Medicine

## 2014-05-12 ENCOUNTER — Ambulatory Visit (INDEPENDENT_AMBULATORY_CARE_PROVIDER_SITE_OTHER): Payer: BC Managed Care – PPO | Admitting: Family Medicine

## 2014-05-12 VITALS — BP 112/64 | HR 80 | Temp 98.0°F | Resp 16 | Ht 66.5 in | Wt 145.4 lb

## 2014-05-12 DIAGNOSIS — R41840 Attention and concentration deficit: Secondary | ICD-10-CM

## 2014-05-12 DIAGNOSIS — Z23 Encounter for immunization: Secondary | ICD-10-CM

## 2014-05-12 MED ORDER — AMPHETAMINE-DEXTROAMPHETAMINE 10 MG PO TABS: ORAL_TABLET | ORAL | Status: DC

## 2014-05-12 MED ORDER — AMPHETAMINE-DEXTROAMPHETAMINE 10 MG PO TABS: 5.0000 mg | ORAL_TABLET | Freq: Two times a day (BID) | ORAL | Status: DC

## 2014-05-12 NOTE — Patient Instructions (Signed)
Please call me or Chelle in about 3 months when you need further refills. Let's meet again in about 6 months.  Take care!

## 2014-05-12 NOTE — Progress Notes (Signed)
[facility] [address] [place] [address] [phone]  Date:  05/12/2014   Name:  Robin [name]   DOB:  1970-11-16   MRN:  [id number]  PCP:  No primary provider on file.    Chief Complaint: Medication Refill   History of Present Illness:  Robin [name] is a 43 y.o. very pleasant female patient who presents with the following:  Here today for a refill of her adderall.  She started adderall about 5 months ago for disturbed concentration especially at work.  She feels that the adderall is helping her. She is currently taking 5-10 mg twice a day- she does not feel that more than 10 will be necessary as this seems to be plenty for her.   She is cutting down on her coffee when she is on the adderall due to too much energy.  She does tend to have some issues with sleep, but is able to manage this by being careful with her sleep hygiene.  If she follows a good routine of stress management and keeping to a schedule she does ok.    Wt Readings from Last 3 Encounters:  05/12/14 145 lb 6.4 oz (65.953 kg)  10/17/13 150 lb (68.04 kg)  05/27/12 152 lb (68.947 kg)   She is eating ok.   LMP 8/25 She would like to get a flu shot today as well  Patient Active Problem List   Diagnosis Date Noted  . Raynaud's phenomenon 10/17/2013  . Hyperlipidemia 09/28/2011    Past Medical History  Diagnosis Date  . Allergy   . Anxiety   . Hyperlipidemia     Past Surgical History  Procedure Laterality Date  . Eye surgery    . Cesarean section      History  Substance Use Topics  . Smoking status: Former Research scientist (life sciences)  . Smokeless tobacco: Not on file  . Alcohol Use: Not on file    No family history on file.  Allergies  Allergen Reactions  . Avelox [Moxifloxacin Hcl In Nacl]   . Cefaclor   . Latex     Medication list has been reviewed and updated.  Current Outpatient Prescriptions on File Prior to Visit  Medication Sig Dispense Refill  .  amphetamine-dextroamphetamine (ADDERALL) 10 MG tablet Take 0.5-1 tablets (5-10 mg total) by mouth 2 (two) times daily with a meal.  30 tablet  0  . fish oil-omega-3 fatty acids 1000 MG capsule Take 2 g by mouth daily.      . Multiple Vitamins-Minerals (WOMENS MULTI VITAMIN & MINERAL) TABS Take by mouth.      . Omega-3 Fatty Acids (SALMON OIL PO) Take by mouth.      . Probiotic Product (PROBIOTIC COMPLEX ACIDOPHILUS PO) Take by mouth.      . sodium chloride (OCEAN) 0.65 % nasal spray Place 1 spray into the nose as needed.      . ranitidine (ZANTAC) 300 MG tablet Take 1 tablet (300 mg total) by mouth at bedtime.  30 tablet  1   No current facility-administered medications on file prior to visit.    Review of Systems:  As per HPI- otherwise negative.   Physical Examination: Filed Vitals:   05/12/14 0848  BP: 112/64  Pulse: 80  Temp: 98 F (36.7 C)  Resp: 16   Filed Vitals:   05/12/14 0848  Height: 5' 6.5" (1.689 m)  Weight: 145 lb 6.4 oz (65.953 kg)   Body mass index is  23.12 kg/(m^2). Ideal Body Weight: Weight in (lb) to have BMI = 25: 156.9  GEN: WDWN, NAD, Non-toxic, A & O x 3, looks well HEENT: Atraumatic, Normocephalic. Neck supple. No masses, No LAD. Ears and Nose: No external deformity. CV: RRR, No M/G/R. No JVD. No thrill. No extra heart sounds. PULM: CTA B, no wheezes, crackles, rhonchi. No retractions. No resp. distress. No accessory muscle use. EXTR: No c/c/e NEURO Normal gait.  PSYCH: Normally interactive. Conversant. Not depressed or anxious appearing.  Calm demeanor.    Assessment and Plan: Disturbed concentration - Plan: amphetamine-dextroamphetamine (ADDERALL) 10 MG tablet, amphetamine-dextroamphetamine (ADDERALL) 10 MG tablet, amphetamine-dextroamphetamine (ADDERALL) 10 MG tablet  Need for prophylactic vaccination and inoculation against influenza - Plan: Flu Vaccine QUAD 36+ mos IM  Flu shot today. Refilled her adderall for 3 months. She will come and  see [place] again in 6 months.  Doing well with her current dose of adderall   Signed Lamar Blinks, MD

## 2014-08-04 ENCOUNTER — Ambulatory Visit (INDEPENDENT_AMBULATORY_CARE_PROVIDER_SITE_OTHER): Payer: BC Managed Care – PPO | Admitting: Physician Assistant

## 2014-08-04 VITALS — BP 110/72 | HR 70 | Temp 98.5°F | Resp 16 | Ht 65.5 in | Wt 146.0 lb

## 2014-08-04 DIAGNOSIS — K1379 Other lesions of oral mucosa: Secondary | ICD-10-CM

## 2014-08-04 DIAGNOSIS — J069 Acute upper respiratory infection, unspecified: Secondary | ICD-10-CM

## 2014-08-04 LAB — POCT SKIN KOH: Skin KOH, POC: NEGATIVE

## 2014-08-04 NOTE — Progress Notes (Signed)
MRN: [id number] DOB: [birth year]  Subjective:   Robin [name] is a 43 y.o. female presenting for worsening cold symptoms and oral thrush.   Cold - reports 1 week history of cold symptoms including sore throat, runny nose, congestion, feeling feverish. 3 days ago, developed hoarseness, sob, started having coughing fits, cough worse at night productive with thick green sputum, reports today sputum is more clear and cough is also improving. Has also had ear fullness, itchy watery eyes, chest tightness but denies wheezing, sinus pain but admits it's improving. Has tried Claritin, Mucinex, albuterol inhaler (daughter's),  with minimal to no relief, drinking plenty of water, resting when she can. Denies history of asthma, GERD. No sick contacts. Has had flu shot this year.  Thrush - reports having plaques on her tongue, worse at night resolved with brushing tongue using peroxide. States that this problem arises whenever she starts with colds, using medicines for colds and from previous use with nasal steroid. Denies history of DM, HIV, poor oral hygiene, steroid use, plaques that she has to scrape off in the morning. She has discussed this with other providers and has been told that a sample of the plaques should be obtained when she is having problems with it. Patient states she knows this is oral thrush and would like to have it confirmed today.  Denies smoking, 1 beer at dinner daily. Denies any other aggravating or relieving factors, no other questions or concerns.   Prior to Admission medications   Medication Sig Start Date End Date Taking? Authorizing Provider  amphetamine-dextroamphetamine (ADDERALL) 10 MG tablet Take 0.5-1 tablets (5-10 mg total) by mouth 2 (two) times daily with a meal. 05/12/14  Yes Gay Filler Copland, MD  amphetamine-dextroamphetamine (ADDERALL) 10 MG tablet Take 5- 10 mg twice a day with a meal. Ok to fill 05/13/2014 05/12/14  Yes Jessica C Copland, MD    amphetamine-dextroamphetamine (ADDERALL) 10 MG tablet Take 5-10 mg twice a day with a meal. Ok to fill 06/12/2014 05/12/14  Yes Jessica C Copland, MD  fish oil-omega-3 fatty acids 1000 MG capsule Take 2 g by mouth daily.   Yes Historical Provider, MD  loratadine (CLARITIN) 10 MG tablet Take 10 mg by mouth daily.   Yes Historical Provider, MD  Multiple Vitamins-Minerals (WOMENS MULTI VITAMIN & MINERAL) TABS Take by mouth.   Yes Historical Provider, MD  Omega-3 Fatty Acids (SALMON OIL PO) Take by mouth.   Yes Historical Provider, MD  Probiotic Product (PROBIOTIC COMPLEX ACIDOPHILUS PO) Take by mouth.   Yes Historical Provider, MD  sodium chloride (OCEAN) 0.65 % nasal spray Place 1 spray into the nose as needed.   Yes Historical Provider, MD  ranitidine (ZANTAC) 300 MG tablet Take 1 tablet (300 mg total) by mouth at bedtime. 05/27/12 05/27/13  Argentina Donovan, PA-C    Allergies  Allergen Reactions  . Avelox [Moxifloxacin Hcl In Nacl]   . Cefaclor   . Latex     Past Medical History  Diagnosis Date  . Allergy   . Anxiety   . Hyperlipidemia     Past Surgical History  Procedure Laterality Date  . Eye surgery    . Cesarean section      ROS As in subjective.   Objective:   Vitals: BP 110/72 mmHg  Pulse 70  Temp(Src) 98.5 F (36.9 C)  Resp 16  Ht 5' 5.5" (1.664 m)  Wt 146 lb (66.225 kg)  BMI 23.92 kg/m2  SpO2 100%  LMP 07/28/2014  Physical Exam  Constitutional: She is oriented to person, place, and time and well-developed, well-nourished, and in no distress. No distress.  HENT:  Mouth/Throat: Mucous membranes are normal. No oral lesions. No oropharyngeal exudate.  Eyes: Conjunctivae are normal. Right eye exhibits no discharge. Left eye exhibits no discharge. No scleral icterus.  Neck: Normal range of motion. Neck supple.  Cardiovascular: Normal rate, regular rhythm, normal heart sounds and intact distal pulses.  Exam reveals no gallop and no friction rub.   No murmur  heard. Pulmonary/Chest: Effort normal and breath sounds normal. No stridor. No respiratory distress. She has no wheezes. She has no rales. She exhibits no tenderness.  Abdominal: Bowel sounds are normal.  Lymphadenopathy:    She has cervical adenopathy (anterior).  Neurological: She is alert and oriented to person, place, and time.  Skin: Skin is warm and dry. No rash noted. She is not diaphoretic. No erythema.   Results for orders placed or performed in visit on 08/04/14 (from the past 24 hour(s))  POCT Skin KOH     Status: None   Collection Time: 08/04/14  5:10 PM  Result Value Ref Range   Skin KOH, POC Negative    Assessment and Plan :   1. Oral soft tissue complaint - No oral lesions observed today, advised that patient return to clinic if she develops white plaques on her tongue - patient will call to request prescription for Magic Mouthwash if her oral complaint worsens and she is unable to return to clinic - POCT Skin KOH  2. Upper respiratory infection - Likely viral in etiology, supportive care advised, return to clinic if symptoms worsen, fail to resolve or as needed   Jaynee Eagles, PA-C [facility] [place] [phone] 08/04/2014 5:22 PM

## 2014-08-04 NOTE — Patient Instructions (Addendum)
Please give me a call if your symptoms worsen over the next week, if you develop ear pain, fevers or drainage. If the plaque on your tongue gets worse, please let me know and I'll send you a prescription for magic mouthwash.    Thrush, Adult  Ritta Slot, also called oral candidiasis, is a fungal infection that develops in the mouth and throat and on the tongue. It causes white patches to form on the mouth and tongue. Ritta Slot is most common in older adults, but it can occur at any age.  Many cases of thrush are mild, but this infection can also be more serious. Ritta Slot can be a recurring problem for people who have chronic illnesses or who take medicines that limit the body's ability to fight infection. Because these people have difficulty fighting infections, the fungus that causes thrush can spread throughout the body. This can cause life-threatening blood or organ infections. CAUSES  Ritta Slot is usually caused by a yeast called Candida albicans. This fungus is normally present in small amounts in the mouth and on other mucous membranes. It usually causes no harm. However, when conditions are present that allow the fungus to grow uncontrolled, it invades surrounding tissues and becomes an infection. Less often, other Candida species can also lead to thrush.  RISK FACTORS Ritta Slot is more likely to develop in the following people:  People with an impaired ability to fight infection (weakened immune system).   Older adults.   People with HIV.   People with diabetes.   People with dry mouth (xerostomia).   Pregnant women.   People with poor dental care, especially those who have false teeth.   People who use antibiotic medicines.  SIGNS AND SYMPTOMS  Ritta Slot can be a mild infection that causes no symptoms. If symptoms develop, they may include:   A burning feeling in the mouth and throat. This can occur at the start of a thrush infection.   White patches that adhere to the mouth and  tongue. The tissue around the patches may be red, raw, and painful. If rubbed (during tooth brushing, for example), the patches and the tissue of the mouth may bleed easily.   A bad taste in the mouth or difficulty tasting foods.   Cottony feeling in the mouth.   Pain during eating and swallowing. DIAGNOSIS  Your health care provider can usually diagnose thrush by looking in your mouth and asking you questions about your health.  TREATMENT  Medicines that help prevent the growth of fungi (antifungals) are the standard treatment for thrush. These medicines are either applied directly to the affected area (topical) or swallowed (oral). The treatment will depend on the severity of the condition.  Mild Thrush Mild cases of thrush may clear up with the use of an antifungal mouth rinse or lozenges. Treatment usually lasts about 14 days.  Moderate to Severe Thrush  More severe thrush infections that have spread to the esophagus are treated with an oral antifungal medicine. A topical antifungal medicine may also be used.   For some severe infections, a treatment period longer than 14 days may be needed.   Oral antifungal medicines are almost never used during pregnancy because the fetus may be harmed. However, if a pregnant woman has a rare, severe thrush infection that has spread to her blood, oral antifungal medicines may be used. In this case, the risk of harm to the mother and fetus from the severe thrush infection may be greater than the risk posed by  the use of antifungal medicines.  Persistent or Recurrent Thrush For cases of thrush that do not go away or keep coming back, treatment may involve the following:   Treatment may be needed twice as long as the symptoms last.   Treatment will include both oral and topical antifungal medicines.   People with weakened immune systems can take an antifungal medicine on a continuous basis to prevent thrush infections.  It is important to  treat conditions that make you more likely to get thrush, such as diabetes or HIV.  HOME CARE INSTRUCTIONS   Only take over-the-counter or prescription medicine as directed by your health care provider. Talk to your health care provider about an over-the-counter medicine called gentian violet, which kills bacteria and fungi.   Eat plain, unflavored yogurt as directed by your health care provider. Check the label to make sure the yogurt contains live cultures. This yogurt can help healthy bacteria grow in the mouth that can stop the growth of the fungus that causes thrush.   Try these measures to help reduce the discomfort of thrush:   Drink cold liquids such as water or iced tea.   Try flavored ice treats or frozen juices.   Eat foods that are easy to swallow, such as gelatin, ice cream, or custard.   If the patches in your mouth are painful, try drinking from a straw.   Rinse your mouth several times a day with a warm saltwater rinse. You can make the saltwater mixture with 1 tsp (6 g) of salt in 8 fl oz (0.2 L) of warm water.   If you wear dentures, remove the dentures before going to bed, brush them vigorously, and soak them in a cleaning solution as directed by your health care provider.   Women who are breastfeeding should clean their nipples with an antifungal medicine as directed by their health care provider. Dry the nipples after breastfeeding. Applying lanolin-containing body lotion may help relieve nipple soreness.  SEEK MEDICAL CARE IF:  Your symptoms are getting worse or are not improving within 7 days of starting treatment.   You have symptoms of spreading infection, such as white patches on the skin outside of the mouth.   You are nursing and you have redness, burning, or pain in the nipples that is not relieved with treatment.  MAKE SURE YOU:  Understand these instructions.  Will watch your condition.  Will get help right away if you are not doing well  or get worse. Document Released: 05/24/2004 Document Revised: 06/19/2013 Document Reviewed: 04/01/2013 [facility] Patient Information 2015 [place], [place]. This information is not intended to replace advice given to you by your health care provider. Make sure you discuss any questions you have with your health care provider.

## 2014-08-04 NOTE — Progress Notes (Signed)
I have discussed this case with Mr. Mani, PA-C and agree.  

## 2014-11-17 ENCOUNTER — Telehealth: Payer: Self-pay

## 2014-11-17 DIAGNOSIS — R41840 Attention and concentration deficit: Secondary | ICD-10-CM

## 2014-11-17 MED ORDER — AMPHETAMINE-DEXTROAMPHETAMINE 10 MG PO TABS: 5.0000 mg | ORAL_TABLET | Freq: Two times a day (BID) | ORAL | Status: DC

## 2014-11-17 NOTE — Telephone Encounter (Signed)
PATIENT IS REQUESTING DR. Lorelei Pont REFILL HER ADDERALL 10 MG. PLEASE CALL HER WHEN IT IS READY TO BE PICKED UP. BEST PHONE [phone] (CELL)  [place]

## 2014-11-17 NOTE — Telephone Encounter (Signed)
Called her- did one month, time for a recheck. She plans to see me this month

## 2014-12-22 ENCOUNTER — Ambulatory Visit (INDEPENDENT_AMBULATORY_CARE_PROVIDER_SITE_OTHER): Payer: 59 | Admitting: Family Medicine

## 2014-12-22 ENCOUNTER — Encounter: Payer: Self-pay | Admitting: Family Medicine

## 2014-12-22 VITALS — BP 117/67 | HR 75 | Temp 97.8°F | Resp 16 | Ht 66.0 in | Wt 149.0 lb

## 2014-12-22 DIAGNOSIS — F900 Attention-deficit hyperactivity disorder, predominantly inattentive type: Secondary | ICD-10-CM

## 2014-12-22 DIAGNOSIS — R41840 Attention and concentration deficit: Secondary | ICD-10-CM | POA: Diagnosis not present

## 2014-12-22 DIAGNOSIS — F909 Attention-deficit hyperactivity disorder, unspecified type: Secondary | ICD-10-CM | POA: Insufficient documentation

## 2014-12-22 MED ORDER — AMPHETAMINE-DEXTROAMPHETAMINE 10 MG PO TABS: ORAL_TABLET | ORAL | Status: DC

## 2014-12-22 NOTE — Patient Instructions (Signed)
Great to see you today- please let me know when you need a refill in 3 months, and let's plan to meet again in person in 6 months.  [facility] Policy for Prescribing Controlled Substances (Revised 07/2012) 1. Prescriptions for controlled substances will be filled by ONE provider at [facility] with whom you have established and developed a plan for your care, including follow-up. 2. You are encouraged to schedule an appointment with your prescriber at our appointment center for follow-up visits whenever possible. 3. If you request a prescription for the controlled substance while at [facility] for an acute problem (with someone other than your regular prescriber), you MAY be given a ONE-TIME prescription for a 30-day supply of the controlled substance, to allow time for you to return to see your regular prescriber for additional prescriptions.

## 2014-12-22 NOTE — Progress Notes (Signed)
[facility] [address] [place] [address] [phone]  Date:  12/22/2014   Name:  Robin [name]   DOB:  1971/04/11   MRN:  [id number]  PCP:  No primary care provider on file.    Chief Complaint: Medication Refill   History of Present Illness:  Robin [name] is a 44 y.o. very pleasant female patient who presents with the following:  Last seen by myself in August of 2015.  She is here today for adderall rf.  She is taking adderall 10 mg once a day- on the weekend she may decrease it to 38m on the weekend She is sleeping ok at night- she was under some stress at work, this is now improved.   Appetite is ok LMP 12/12/14   Wt Readings from Last 3 Encounters:  12/22/14 149 lb (67.586 kg)  08/04/14 146 lb (66.225 kg)  05/12/14 145 lb 6.4 oz (65.953 kg)     Patient Active Problem List   Diagnosis Date Noted  . Raynaud's phenomenon 10/17/2013  . Hyperlipidemia 09/28/2011    Past Medical History  Diagnosis Date  . Allergy   . Anxiety   . Hyperlipidemia     Past Surgical History  Procedure Laterality Date  . Eye surgery    . Cesarean section      History  Substance Use Topics  . Smoking status: Former SResearch scientist (life sciences) . Smokeless tobacco: Not on file  . Alcohol Use: Not on file    Family History  Problem Relation Age of Onset  . Heart disease Mother   . Hyperlipidemia Mother   . Arthritis Mother   . Hyperlipidemia Father     Allergies  Allergen Reactions  . Avelox [Moxifloxacin Hcl In Nacl]   . Cefaclor   . Latex     Medication list has been reviewed and updated.  Current Outpatient Prescriptions on File Prior to Visit  Medication Sig Dispense Refill  . amphetamine-dextroamphetamine (ADDERALL) 10 MG tablet Take 5- 10 mg twice a day with a meal. Ok to fill 05/13/2014 60 tablet 0  . amphetamine-dextroamphetamine (ADDERALL) 10 MG tablet Take 5-10 mg twice a day with a meal. Ok to fill 06/12/2014 60 tablet 0  .  amphetamine-dextroamphetamine (ADDERALL) 10 MG tablet Take 0.5-1 tablets (5-10 mg total) by mouth 2 (two) times daily with a meal. 30 tablet 0  . fish oil-omega-3 fatty acids 1000 MG capsule Take 2 g by mouth daily.    .Marland Kitchenloratadine (CLARITIN) 10 MG tablet Take 10 mg by mouth daily.    . Multiple Vitamins-Minerals (WOMENS MULTI VITAMIN & MINERAL) TABS Take by mouth.    . Omega-3 Fatty Acids (SALMON OIL PO) Take by mouth.    . Probiotic Product (PROBIOTIC COMPLEX ACIDOPHILUS PO) Take by mouth.    . sodium chloride (OCEAN) 0.65 % nasal spray Place 1 spray into the nose as needed.    . ranitidine (ZANTAC) 300 MG tablet Take 1 tablet (300 mg total) by mouth at bedtime. 30 tablet 1   No current facility-administered medications on file prior to visit.    Review of Systems: As per HPI- otherwise negative.    Physical Examination: Filed Vitals:   12/22/14 0829  BP: 117/67  Pulse: 75  Temp: 97.8 F (36.6 C)  Resp: 16   Filed Vitals:   12/22/14 0829  Height: 5' 6"  (1.676 m)  Weight: 149 lb (67.586 kg)   Body mass index is 24.06 kg/(m^2). Ideal Body  Weight: Weight in (lb) to have BMI = 25: 154.6  GEN: WDWN, NAD, Non-toxic, A & O x 3 HEENT: Atraumatic, Normocephalic. Neck supple. No masses, No LAD. Ears and Nose: No external deformity. CV: RRR, No M/G/R. No JVD. No thrill. No extra heart sounds. PULM: CTA B, no wheezes, crackles, rhonchi. No retractions. No resp. distress. No accessory muscle use. ABD: S, NT, ND, +BS. No rebound. No HSM. EXTR: No c/c/e NEURO Normal gait.  PSYCH: Normally interactive. Conversant. Not depressed or anxious appearing.  Calm demeanor.    Assessment and Plan: Disturbed concentration - Plan: amphetamine-dextroamphetamine (ADDERALL) 10 MG tablet, amphetamine-dextroamphetamine (ADDERALL) 10 MG tablet, amphetamine-dextroamphetamine (ADDERALL) 10 MG tablet  Gave info for scholarship fund for mammogram at [facility] for her Refilled her adderall for 3 months.     Signed Lamar Blinks, MD

## 2014-12-30 ENCOUNTER — Telehealth: Payer: Self-pay

## 2014-12-30 NOTE — Telephone Encounter (Signed)
Approved until 12/30/2015. Faxed approval to pharm.

## 2014-12-30 NOTE — Telephone Encounter (Signed)
PA started for Adderall 10 mg tablets

## 2015-01-05 ENCOUNTER — Telehealth: Payer: Self-pay

## 2015-01-05 NOTE — Telephone Encounter (Signed)
Adderall approved thru 12/30/2015.

## 2015-05-03 ENCOUNTER — Ambulatory Visit (INDEPENDENT_AMBULATORY_CARE_PROVIDER_SITE_OTHER): Payer: 59 | Admitting: Family Medicine

## 2015-05-03 VITALS — BP 110/60 | HR 60 | Temp 98.4°F | Resp 14 | Ht 65.5 in | Wt 147.6 lb

## 2015-05-03 DIAGNOSIS — H6092 Unspecified otitis externa, left ear: Secondary | ICD-10-CM | POA: Diagnosis not present

## 2015-05-03 DIAGNOSIS — L700 Acne vulgaris: Secondary | ICD-10-CM | POA: Diagnosis not present

## 2015-05-03 DIAGNOSIS — R41840 Attention and concentration deficit: Secondary | ICD-10-CM

## 2015-05-03 MED ORDER — AMPHETAMINE-DEXTROAMPHETAMINE 10 MG PO TABS: ORAL_TABLET | ORAL | Status: DC

## 2015-05-03 MED ORDER — HYDROCORTISONE-ACETIC ACID 1-2 % OT SOLN: 4.0000 [drp] | Freq: Four times a day (QID) | OTIC | Status: DC

## 2015-05-03 NOTE — Patient Instructions (Signed)
Use the ear drops 4x a day for 5-7 days; let me know if your ear is not better in the next couple of days Continue adderall as usual We will refer you to derm to look at your chin

## 2015-05-03 NOTE — Progress Notes (Signed)
[facility] [address] [place] [address] [phone]  Date:  05/03/2015   Name:  Robin [name]   DOB:  13-Feb-1971   MRN:  [id number]  PCP:  Lamar Blinks, MD    Chief Complaint: Otitis Externa; Medication Refill; and Acne   History of Present Illness:  Robin [name] is a 44 y.o. very pleasant female patient who presents with the following:  She is treated with a low dose of adderall for ADHD. Needs a RF today- she is doing well with her current dose, does not notice any adverse SE She has noted a problem with her left ear- decreased hearing, feels "plugged." it will drain overnight but starts to hurt during the day She has noted this for 10 days or so- started while [place] was at the beach She is prone to swimmer's ear.  Lost her usual preventative vinegar drops  She has not noted any other URI sx except for some allergies/ nasal congestion  She has noted some deep, cystic acne in her chin only- she would like to see derm to discuss this States no chance of pregnancy currently  Patient Active Problem List   Diagnosis Date Noted  . ADHD (attention deficit hyperactivity disorder) 12/22/2014  . Raynaud's phenomenon 10/17/2013  . Hyperlipidemia 09/28/2011    Past Medical History  Diagnosis Date  . Allergy   . Anxiety   . Hyperlipidemia     Past Surgical History  Procedure Laterality Date  . Eye surgery    . Cesarean section      Social History  Substance Use Topics  . Smoking status: Former Research scientist (life sciences)  . Smokeless tobacco: None  . Alcohol Use: None    Family History  Problem Relation Age of Onset  . Heart disease Mother   . Hyperlipidemia Mother   . Arthritis Mother   . Hyperlipidemia Father     Allergies  Allergen Reactions  . Avelox [Moxifloxacin Hcl In Nacl]   . Cefaclor   . Latex     Medication list has been reviewed and updated.  Current Outpatient Prescriptions on File Prior to Visit  Medication Sig Dispense Refill   . amphetamine-dextroamphetamine (ADDERALL) 10 MG tablet Take 10 mg once a day 30 tablet 0  . loratadine (CLARITIN) 10 MG tablet Take 10 mg by mouth daily.    . Multiple Vitamins-Minerals (WOMENS MULTI VITAMIN & MINERAL) TABS Take by mouth.    . Omega-3 Fatty Acids (SALMON OIL PO) Take by mouth.    . Probiotic Product (PROBIOTIC COMPLEX ACIDOPHILUS PO) Take by mouth.    . sodium chloride (OCEAN) 0.65 % nasal spray Place 1 spray into the nose as needed.    Marland Kitchen amphetamine-dextroamphetamine (ADDERALL) 10 MG tablet Take 10 mg once a day. May fill 30 days after rx (Patient not taking: Reported on 05/03/2015) 30 tablet 0  . amphetamine-dextroamphetamine (ADDERALL) 10 MG tablet Take 10 mg once a day.  Ok to fill 60 days after rx (Patient not taking: Reported on 05/03/2015) 30 tablet 0  . fish oil-omega-3 fatty acids 1000 MG capsule Take 2 g by mouth daily.    . ranitidine (ZANTAC) 300 MG tablet Take 1 tablet (300 mg total) by mouth at bedtime. 30 tablet 1   No current facility-administered medications on file prior to visit.    Review of Systems:  As per HPI- otherwise negative.   Physical Examination: Filed Vitals:   05/03/15 1012  BP: 110/60  Pulse: 60  Temp: 98.4 F (36.9 C)  Resp: 14   Filed Vitals:   05/03/15 1012  Height: 5' 5.5" (1.664 m)  Weight: 147 lb 9.6 oz (66.951 kg)   Body mass index is 24.18 kg/(m^2). Ideal Body Weight: Weight in (lb) to have BMI = 25: 152.2  GEN: WDWN, NAD, Non-toxic, A & O x 3, looks well HEENT: Atraumatic, Normocephalic. Neck supple. No masses, No LAD.  Bilateral TM wnl, oropharynx normal.  PEERL,EOMI.  The left ear canal shows debris and redness, but is not occluded Ears and Nose: No external deformity. CV: RRR, No M/G/R. No JVD. No thrill. No extra heart sounds. PULM: CTA B, no wheezes, crackles, rhonchi. No retractions. No resp. distress. No accessory muscle use. EXTR: No c/c/e NEURO Normal gait.  PSYCH: Normally interactive. Conversant. Not  depressed or anxious appearing.  Calm demeanor.    Assessment and Plan: Left otitis externa - Plan: acetic acid-hydrocortisone (VOSOL-HC) otic solution  Disturbed concentration - Plan: amphetamine-dextroamphetamine (ADDERALL) 10 MG tablet, amphetamine-dextroamphetamine (ADDERALL) 10 MG tablet, amphetamine-dextroamphetamine (ADDERALL) 10 MG tablet  Cystic acne - Plan: Ambulatory referral to Dermatology  Refilled adderall for her- plan to recheck in 6 months Ear drops for acute OE- followup if not better soon! Referral to derm  Signed Lamar Blinks, MD

## 2015-05-08 ENCOUNTER — Telehealth: Payer: Self-pay

## 2015-05-08 NOTE — Telephone Encounter (Signed)
Pt still has the ear infection and would like to know what to do next   Best number [phone]

## 2015-05-09 NOTE — Telephone Encounter (Signed)
Cystic acne - Plan: Ambulatory referral to Dermatology  Refilled adderall for her- plan to recheck in 6 months Ear drops for acute OE- followup if not better soon! Referral to derm   Called pt to advise her to follow up. Pt understood.

## 2015-05-12 ENCOUNTER — Ambulatory Visit (INDEPENDENT_AMBULATORY_CARE_PROVIDER_SITE_OTHER): Payer: 59 | Admitting: Physician Assistant

## 2015-05-12 ENCOUNTER — Encounter: Payer: Self-pay | Admitting: Physician Assistant

## 2015-05-12 VITALS — BP 100/60 | HR 64 | Temp 98.5°F | Resp 16 | Ht 66.0 in | Wt 150.0 lb

## 2015-05-12 DIAGNOSIS — H60392 Other infective otitis externa, left ear: Secondary | ICD-10-CM | POA: Diagnosis not present

## 2015-05-12 MED ORDER — NEOMYCIN-POLYMYXIN-HC 3.5-10000-1 OT SUSP: 4.0000 [drp] | Freq: Three times a day (TID) | OTIC | Status: DC

## 2015-05-12 NOTE — Progress Notes (Signed)
Patient ID: Robin [name], female    DOB: [birth year], 44 y.o.   MRN: [id number]  PCP: Lamar Blinks, MD  Subjective:   Chief Complaint  Patient presents with  . Follow-up    LEFT EAR    HPI Presents for evaluation of LEFT ear pain.  She was evaluated for same on 8/21 and prescribed acetic acid-hydrocortisone solution for LEFT otitis externa.  She reports no improvement.  No fever/chills. No headache or dizziness. No ringing. Still describes a bit of fullness.  Some drainage from the ear in the mornings.  No other upper respiratory symptoms.    Review of Systems As above.    Patient Active Problem List   Diagnosis Date Noted  . ADHD (attention deficit hyperactivity disorder) 12/22/2014  . Raynaud's phenomenon 10/17/2013  . Hyperlipidemia 09/28/2011     Prior to Admission medications   Medication Sig Start Date End Date Taking? Authorizing Provider  acetic acid-hydrocortisone (VOSOL-HC) otic solution Place 4 drops into the left ear 4 (four) times daily. 05/03/15  Yes Gay Filler Copland, MD  amphetamine-dextroamphetamine (ADDERALL) 10 MG tablet Take 10 mg once a day.  Ok to fill 60 days after rx 05/03/15  Yes Gay Filler Copland, MD  loratadine (CLARITIN) 10 MG tablet Take 10 mg by mouth daily.   Yes Historical Provider, MD  Multiple Vitamins-Minerals (WOMENS MULTI VITAMIN & MINERAL) TABS Take by mouth.   Yes Historical Provider, MD  Omega-3 Fatty Acids (SALMON OIL PO) Take by mouth.   Yes Historical Provider, MD  Probiotic Product (PROBIOTIC COMPLEX ACIDOPHILUS PO) Take by mouth.   Yes Historical Provider, MD  sodium chloride (OCEAN) 0.65 % nasal spray Place 1 spray into the nose as needed.   Yes Historical Provider, MD  amphetamine-dextroamphetamine (ADDERALL) 10 MG tablet Take 10 mg once a day. May fill 30 days after rx Patient not taking: Reported on 05/12/2015 05/03/15   Darreld Mclean, MD  amphetamine-dextroamphetamine (ADDERALL) 10 MG tablet Take 10 mg once a  day Patient not taking: Reported on 05/12/2015 05/03/15   Darreld Mclean, MD  fish oil-omega-3 fatty acids 1000 MG capsule Take 2 g by mouth daily.    Historical Provider, MD  ranitidine (ZANTAC) 300 MG tablet Take 1 tablet (300 mg total) by mouth at bedtime. 05/27/12 05/27/13  Argentina Donovan, PA-C     Allergies  Allergen Reactions  . Avelox [Moxifloxacin Hcl In Nacl]   . Cefaclor   . Latex        Objective:  Physical Exam  Constitutional: She is oriented to person, place, and time. She appears well-developed and well-nourished. She is active and cooperative. No distress.  BP 100/60 mmHg  Pulse 64  Temp(Src) 98.5 F (36.9 C) (Oral)  Resp 16  Ht 5' 6"  (1.676 m)  Wt 150 lb (68.04 kg)  BMI 24.22 kg/m2  LMP 05/08/2015   HENT:  Head: Normocephalic and atraumatic.  Right Ear: Hearing, tympanic membrane, external ear and ear canal normal.  Left Ear: Hearing and tympanic membrane normal. No lacerations. There is tenderness (with palpation of the tragus, not of the pinna). No drainage or swelling. Left ear foreign body: moderate soft white material in the canal which is minimally erythematous. No mastoid tenderness. Tympanic membrane is not injected, not scarred, not perforated, not erythematous, not retracted and not bulging.  No middle ear effusion. No hemotympanum. No decreased hearing is noted.  Eyes: Conjunctivae are normal.  Neck: Normal range of motion, full passive range of motion  without pain and phonation normal. Neck supple. No thyroid mass and no thyromegaly present.  Pulmonary/Chest: Effort normal.  Lymphadenopathy:       Head (right side): No submental, no submandibular, no tonsillar, no preauricular and no posterior auricular adenopathy present.       Head (left side): No submental, no submandibular, no tonsillar, no preauricular and no posterior auricular adenopathy present.    She has no cervical adenopathy.       Right: No supraclavicular adenopathy present.        Left: No supraclavicular adenopathy present.  Neurological: She is alert and oriented to person, place, and time.  Psychiatric: She has a normal mood and affect. Her speech is normal and behavior is normal.           Assessment & Plan:   1. Otitis, externa, infective, left Switch to cortisporin drops. If no improvement, would recommend ENT specialist evaluation. - neomycin-polymyxin-hydrocortisone (CORTISPORIN) 3.5-10000-1 otic suspension; Place 4 drops into the left ear 3 (three) times daily.  Dispense: 10 mL; Refill: 0   Fara Chute, PA-C Physician Assistant-Certified [facility]

## 2015-06-29 ENCOUNTER — Ambulatory Visit: Payer: 59 | Admitting: Family Medicine

## 2015-07-01 ENCOUNTER — Ambulatory Visit: Payer: 59 | Admitting: Family Medicine

## 2015-08-14 ENCOUNTER — Ambulatory Visit (INDEPENDENT_AMBULATORY_CARE_PROVIDER_SITE_OTHER): Payer: 59 | Admitting: Family Medicine

## 2015-08-14 VITALS — BP 110/66 | HR 77 | Temp 98.5°F | Resp 17 | Ht 67.0 in | Wt 155.8 lb

## 2015-08-14 DIAGNOSIS — R062 Wheezing: Secondary | ICD-10-CM | POA: Diagnosis not present

## 2015-08-14 DIAGNOSIS — F909 Attention-deficit hyperactivity disorder, unspecified type: Secondary | ICD-10-CM

## 2015-08-14 DIAGNOSIS — R41840 Attention and concentration deficit: Secondary | ICD-10-CM

## 2015-08-14 DIAGNOSIS — F988 Other specified behavioral and emotional disorders with onset usually occurring in childhood and adolescence: Secondary | ICD-10-CM

## 2015-08-14 DIAGNOSIS — R05 Cough: Secondary | ICD-10-CM

## 2015-08-14 DIAGNOSIS — R059 Cough, unspecified: Secondary | ICD-10-CM

## 2015-08-14 LAB — POCT CBC
Granulocyte percent: 42 %G (ref 37–80)
HCT, POC: 37.4 % — AB (ref 37.7–47.9)
Hemoglobin: 12.9 g/dL (ref 12.2–16.2)
Lymph, poc: 1.6 (ref 0.6–3.4)
MCH, POC: 30.6 pg (ref 27–31.2)
MCHC: 34.6 g/dL (ref 31.8–35.4)
MCV: 88.5 fL (ref 80–97)
MID (cbc): 0.4 (ref 0–0.9)
MPV: 7.3 fL (ref 0–99.8)
POC Granulocyte: 1.5 — AB (ref 2–6.9)
POC LYMPH PERCENT: 45.8 %L (ref 10–50)
POC MID %: 12.2 %M — AB (ref 0–12)
Platelet Count, POC: 253 10*3/uL (ref 142–424)
RBC: 4.23 M/uL (ref 4.04–5.48)
RDW, POC: 14.9 %
WBC: 3.6 10*3/uL — AB (ref 4.6–10.2)

## 2015-08-14 MED ORDER — AMPHETAMINE-DEXTROAMPHETAMINE 10 MG PO TABS: ORAL_TABLET | ORAL | Status: DC

## 2015-08-14 MED ORDER — HYDROCODONE-HOMATROPINE 5-1.5 MG/5ML PO SYRP: 5.0000 mL | ORAL_SOLUTION | Freq: Three times a day (TID) | ORAL | Status: DC | PRN

## 2015-08-14 MED ORDER — ALBUTEROL SULFATE HFA 108 (90 BASE) MCG/ACT IN AERS: 2.0000 | INHALATION_SPRAY | Freq: Four times a day (QID) | RESPIRATORY_TRACT | Status: DC | PRN

## 2015-08-14 MED ORDER — PREDNISONE 20 MG PO TABS: ORAL_TABLET | ORAL | Status: DC

## 2015-08-14 NOTE — Patient Instructions (Addendum)
Continue to use your adderall as usual Let me know if you are not feeling better soon- Sooner if worse.   The albuterol inhaler will help with wheezing- use this as needed Use the prednisone as directed Use the cough syrup as needed but remember it will make you sleepy

## 2015-08-14 NOTE — Progress Notes (Signed)
[facility] [address], [place]  [address] [phone]  Date:  08/14/2015   Name:  Robin [name]   DOB:  [birth year]   MRN:  [id number]  PCP:  Lamar Blinks, MD    Chief Complaint: Cough; Sore Throat; and Medication Refill   History of Present Illness:  Robin [name] is a 44 y.o. very pleasant female patient who presents with the following:  She has noted sx for a week now- thought that was getting a cold.  She noted ST, congestion.  Congestion is better, her nose is better but she does have a lot of sinus pressure and cough. She feels like the cough is from drainage.  She has noted some wheezing and the cough is keeping her up at night She may have had a fever at first but none more recently Mild aches, mild chills She is using ibuprofen currenty No GI symptoms  She takes adderall 10 mg- this works for her, she is sleeping and eating ok in general    Patient Active Problem List   Diagnosis Date Noted  . ADHD (attention deficit hyperactivity disorder) 12/22/2014  . Raynaud's phenomenon 10/17/2013  . Hyperlipidemia 09/28/2011    Past Medical History  Diagnosis Date  . Allergy   . Anxiety   . Hyperlipidemia     Past Surgical History  Procedure Laterality Date  . Eye surgery    . Cesarean section      Social History  Substance Use Topics  . Smoking status: Former Research scientist (life sciences)  . Smokeless tobacco: None  . Alcohol Use: None    Family History  Problem Relation Age of Onset  . Heart disease Mother   . Hyperlipidemia Mother   . Arthritis Mother   . Hyperlipidemia Father     Allergies  Allergen Reactions  . Avelox [Moxifloxacin Hcl In Nacl] Other (See Comments)    Muscle cramps  . Cefaclor   . Latex     Medication list has been reviewed and updated.  Current Outpatient Prescriptions on File Prior to Visit  Medication Sig Dispense Refill  . acetic acid-hydrocortisone (VOSOL-HC) otic solution Place 4 drops into the left ear 4  (four) times daily. 10 mL 0  . amphetamine-dextroamphetamine (ADDERALL) 10 MG tablet Take 10 mg once a day.  Ok to fill 60 days after rx 30 tablet 0  . amphetamine-dextroamphetamine (ADDERALL) 10 MG tablet Take 10 mg once a day. May fill 30 days after rx 30 tablet 0  . amphetamine-dextroamphetamine (ADDERALL) 10 MG tablet Take 10 mg once a day 30 tablet 0  . fish oil-omega-3 fatty acids 1000 MG capsule Take 2 g by mouth daily.    Marland Kitchen loratadine (CLARITIN) 10 MG tablet Take 10 mg by mouth daily.    . Multiple Vitamins-Minerals (WOMENS MULTI VITAMIN & MINERAL) TABS Take by mouth.    . neomycin-polymyxin-hydrocortisone (CORTISPORIN) 3.5-10000-1 otic suspension Place 4 drops into the left ear 3 (three) times daily. 10 mL 0  . Omega-3 Fatty Acids (SALMON OIL PO) Take by mouth.    . Probiotic Product (PROBIOTIC COMPLEX ACIDOPHILUS PO) Take by mouth.    . sodium chloride (OCEAN) 0.65 % nasal spray Place 1 spray into the nose as needed.    . ranitidine (ZANTAC) 300 MG tablet Take 1 tablet (300 mg total) by mouth at bedtime. 30 tablet 1   No current facility-administered medications on file prior to visit.    Review of Systems:  As per HPI- otherwise  negative.   Physical Examination: Filed Vitals:   08/14/15 1137  BP: 110/66  Pulse: 77  Temp: 98.5 F (36.9 C)  Resp: 17   Filed Vitals:   08/14/15 1137  Height: 5' 7"  (1.702 m)  Weight: 155 lb 12.8 oz (70.67 kg)   Body mass index is 24.4 kg/(m^2). Ideal Body Weight: Weight in (lb) to have BMI = 25: 159.3  GEN: WDWN, NAD, Non-toxic, A & O x 3, looks wel HEENT: Atraumatic, Normocephalic. Neck supple. No masses, No LAD.  Bilateral TM wnl, oropharynx normal.  PEERL,EOMI.   Ears and Nose: No external deformity. CV: RRR, No M/G/R. No JVD. No thrill. No extra heart sounds. PULM: CTA B, no  crackles, rhonchi. No retractions. No resp. distress. No accessory muscle use.  Mild wheezes ABD: S, NT, ND, +BS. No rebound. No HSM. EXTR: No c/c/e NEURO  Normal gait.  PSYCH: Normally interactive. Conversant. Not depressed or anxious appearing.  Calm demeanor.   Results for orders placed or performed in visit on 08/14/15  POCT CBC  Result Value Ref Range   WBC 3.6 (A) 4.6 - 10.2 K/uL   Lymph, poc 1.6 0.6 - 3.4   POC LYMPH PERCENT 45.8 10 - 50 %L   MID (cbc) 0.4 0 - 0.9   POC MID % 12.2 (A) 0 - 12 %M   POC Granulocyte 1.5 (A) 2 - 6.9   Granulocyte percent 42.0 37 - 80 %G   RBC 4.23 4.04 - 5.48 M/uL   Hemoglobin 12.9 12.2 - 16.2 g/dL   HCT, POC 37.4 (A) 37.7 - 47.9 %   MCV 88.5 80 - 97 fL   MCH, POC 30.6 27 - 31.2 pg   MCHC 34.6 31.8 - 35.4 g/dL   RDW, POC 14.9 %   Platelet Count, POC 253 142 - 424 K/uL   MPV 7.3 0 - 99.8 fL     Assessment and Plan: Wheezing - Plan: POCT CBC, albuterol (PROVENTIL HFA;VENTOLIN HFA) 108 (90 BASE) MCG/ACT inhaler, predniSONE (DELTASONE) 20 MG tablet  Cough - Plan: HYDROcodone-homatropine (HYCODAN) 5-1.5 MG/5ML syrup  ADD (attention deficit disorder)  Disturbed concentration - Plan: amphetamine-dextroamphetamine (ADDERALL) 10 MG tablet, amphetamine-dextroamphetamine (ADDERALL) 10 MG tablet, amphetamine-dextroamphetamine (ADDERALL) 10 MG tablet  Prednisone and albuterol for wheezing likely due to a viral infection Hycodan as needed cautioned regarding sedation Refilled her adderall for 3 months   Signed Lamar Blinks, MD

## 2015-08-15 ENCOUNTER — Telehealth: Payer: Self-pay

## 2015-09-08 ENCOUNTER — Ambulatory Visit (INDEPENDENT_AMBULATORY_CARE_PROVIDER_SITE_OTHER): Payer: 59 | Admitting: Family Medicine

## 2015-09-08 VITALS — BP 106/70 | HR 71 | Temp 97.6°F | Resp 18 | Ht 67.0 in | Wt 154.0 lb

## 2015-09-08 DIAGNOSIS — J0101 Acute recurrent maxillary sinusitis: Secondary | ICD-10-CM

## 2015-09-08 MED ORDER — AMOXICILLIN-POT CLAVULANATE 875-125 MG PO TABS: 1.0000 | ORAL_TABLET | Freq: Two times a day (BID) | ORAL | Status: DC

## 2015-09-08 MED ORDER — BORIC ACID POWD: 600.0000 mg | Freq: Every day | Status: DC

## 2015-09-08 NOTE — Addendum Note (Signed)
Addended by: Constance Goltz on: 09/08/2015 12:09 PM   Modules accepted: Miquel Dunn

## 2015-09-08 NOTE — Progress Notes (Signed)
By signing my name below, I, Moises Blood, attest that this documentation has been prepared under the direction and in the presence of Robyn Haber, MD. Electronically Signed: Moises Blood, [place]. 09/08/2015 , 11:07 AM .  Patient was seen in room 5 .   Patient ID: Robin [name] MRN: [id number], DOB: September 23, 1970, 44 y.o. Date of Encounter: 09/08/2015  Primary Physician: Lamar Blinks, MD  Chief Complaint:  Chief Complaint  Patient presents with   congestion   Otalgia   Generalized Body Aches   Sore Throat   sinus pressure    HPI:  Robin [name] is a 44 y.o. female who presents to [facility] complaining of congestion with otalgia, general myalgia, sore throat and sinus pressure that worsened 4 days ago. She notes having a bad cold a month ago but believes that it never fully cleared.   She states having chronic sinus infection in the past caused by a tooth. She had surgery to remove it in 2010 and hasn't had sinus infections since.   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   Past Medical History  Diagnosis Date   Allergy    Anxiety    Hyperlipidemia      Home Meds: Prior to Admission medications   Medication Sig Start Date End Date Taking? Authorizing Provider  albuterol (PROVENTIL HFA;VENTOLIN HFA) 108 (90 BASE) MCG/ACT inhaler Inhale 2 puffs into the lungs every 6 (six) hours as needed for wheezing or shortness of breath. 08/14/15  Yes Gay Filler Copland, MD  amphetamine-dextroamphetamine (ADDERALL) 10 MG tablet Take 10 mg once a day.  Ok to fill 60 days after rx 08/14/15  Yes Gay Filler Copland, MD  amphetamine-dextroamphetamine (ADDERALL) 10 MG tablet Take 10 mg once a day. May fill 30 days after rx 08/14/15  Yes Gay Filler Copland, MD  amphetamine-dextroamphetamine (ADDERALL) 10 MG tablet Take 10 mg once a day 08/14/15  Yes Gay Filler Copland, MD  fish oil-omega-3 fatty acids 1000 MG capsule Take 2 g by mouth daily.    Yes Historical Provider, MD  loratadine (CLARITIN) 10 MG tablet Take 10 mg by mouth daily.   Yes Historical Provider, MD  Multiple Vitamins-Minerals (WOMENS MULTI VITAMIN & MINERAL) TABS Take by mouth.   Yes Historical Provider, MD  Omega-3 Fatty Acids (SALMON OIL PO) Take by mouth.   Yes Historical Provider, MD  Probiotic Product (PROBIOTIC COMPLEX ACIDOPHILUS PO) Take by mouth.   Yes Historical Provider, MD  sodium chloride (OCEAN) 0.65 % nasal spray Place 1 spray into the nose as needed.   Yes Historical Provider, MD  acetic acid-hydrocortisone (VOSOL-HC) otic solution Place 4 drops into the left ear 4 (four) times daily. Patient not taking: Reported on 09/08/2015 05/03/15   Darreld Mclean, MD  HYDROcodone-homatropine (HYCODAN) 5-1.5 MG/5ML syrup Take 5 mLs by mouth every 8 (eight) hours as needed for cough. Patient not taking: Reported on 09/08/2015 08/14/15   Darreld Mclean, MD  neomycin-polymyxin-hydrocortisone (CORTISPORIN) 3.5-10000-1 otic suspension Place 4 drops into the left ear 3 (three) times daily. Patient not taking: Reported on 09/08/2015 05/12/15   Chelle Jeffery, PA-C  ranitidine (ZANTAC) 300 MG tablet Take 1 tablet (300 mg total) by mouth at bedtime. 05/27/12 05/27/13  Argentina Donovan, PA-C    Allergies:  Allergies  Allergen Reactions   Avelox [Moxifloxacin Hcl In Nacl] Other (See Comments)    Muscle cramps   Cefaclor    Latex     Social History  Social History   Marital Status: Married    Spouse Name: N/A   Number of Children: N/A   Years of Education: N/A   Occupational History   Not on file.   Social History Main Topics   Smoking status: Former Smoker   Smokeless tobacco: Not on file   Alcohol Use: Not on file   Drug Use: Not on file   Sexual Activity: Not on file   Other Topics Concern   Not on file   Social History Narrative     Review of Systems: Constitutional: negative for fever, chills, night sweats, weight changes, or  fatigue  HEENT: negative for vision changes, hearing loss, rhinorrhea, epistaxis; positive for congestion, ear pain, sore throat, sinus pressure  Cardiovascular: negative for chest pain or palpitations Respiratory: negative for hemoptysis, wheezing, shortness of breath, or cough Abdominal: negative for abdominal pain, nausea, vomiting, diarrhea, or constipation Dermatological: negative for rash Neurologic: negative for headache, dizziness, or syncope Musc: positive for myalgia (general body)  All other systems reviewed and are otherwise negative with the exception to those above and in the HPI.  Physical Exam: Blood pressure 106/70, pulse 71, temperature 97.6 F (36.4 C), temperature source Oral, resp. rate 18, height 5' 7"  (1.702 m), weight 154 lb (69.854 kg), last menstrual period 08/12/2015, SpO2 96 %., Body mass index is 24.11 kg/(m^2). General: Well developed, well nourished, in no acute distress. Head: Normocephalic, atraumatic, eyes without discharge, sclera non-icteric, Bilateral auditory canals clear, TM's are without perforation, pearly grey and translucent with reflective cone of light bilaterally. Throat has patch of erythema posterior pharynx, nose purulent discharge in both passages Neck: Supple. No thyromegaly. Full ROM. No lymphadenopathy. Lungs: Clear bilaterally to auscultation without wheezes, rales, or rhonchi. Breathing is unlabored. Heart: RRR with S1 S2. No murmurs, rubs, or gallops appreciated. Msk:  Strength and tone normal for age. Extremities/Skin: Warm and dry. No clubbing or cyanosis. No edema. No rashes or suspicious lesions. Neuro: Alert and oriented X 3. Moves all extremities spontaneously. Gait is normal. CNII-XII grossly in tact. Psych:  Responds to questions appropriately with a normal affect.    ASSESSMENT AND PLAN:  44 y.o. year old female with  This chart was scribed in my presence and reviewed by me personally.    ICD-9-CM ICD-10-CM   1. Acute  recurrent maxillary sinusitis 461.0 J01.01 amoxicillin-clavulanate (AUGMENTIN) 875-125 MG tablet      Signed, Robyn Haber, MD 09/08/2015 11:07 AM

## 2015-09-13 HISTORY — PX: COLONOSCOPY: SHX174

## 2015-10-02 ENCOUNTER — Encounter: Payer: Self-pay | Admitting: Family Medicine

## 2015-10-07 ENCOUNTER — Encounter: Payer: Self-pay | Admitting: Family Medicine

## 2015-11-23 ENCOUNTER — Telehealth: Payer: Self-pay

## 2015-11-23 NOTE — Telephone Encounter (Signed)
Pt is needing a refill on her adderall  Best number [phone]

## 2015-11-24 NOTE — Telephone Encounter (Signed)
Dr. Lorelei Pont pt

## 2015-11-26 NOTE — Telephone Encounter (Signed)
She was last seen in December 2016. Patient is due for f/u and since Dr. Lorelei Pont is no longer working here needs to establish care with a different provider.

## 2015-11-26 NOTE — Telephone Encounter (Signed)
Pt advised.

## 2015-11-27 ENCOUNTER — Ambulatory Visit (INDEPENDENT_AMBULATORY_CARE_PROVIDER_SITE_OTHER): Payer: Self-pay | Admitting: Family Medicine

## 2015-11-27 VITALS — BP 118/74 | HR 57 | Temp 98.1°F | Resp 18 | Wt 153.0 lb

## 2015-11-27 DIAGNOSIS — F909 Attention-deficit hyperactivity disorder, unspecified type: Secondary | ICD-10-CM

## 2015-11-27 DIAGNOSIS — F988 Other specified behavioral and emotional disorders with onset usually occurring in childhood and adolescence: Secondary | ICD-10-CM

## 2015-11-27 DIAGNOSIS — Z23 Encounter for immunization: Secondary | ICD-10-CM

## 2015-11-27 DIAGNOSIS — Z3009 Encounter for other general counseling and advice on contraception: Secondary | ICD-10-CM

## 2015-11-27 DIAGNOSIS — R41840 Attention and concentration deficit: Secondary | ICD-10-CM

## 2015-11-27 MED ORDER — AMPHETAMINE-DEXTROAMPHETAMINE 10 MG PO TABS: ORAL_TABLET | ORAL | Status: DC

## 2015-11-27 NOTE — Progress Notes (Signed)
Subjective:    Patient ID: Robin [name], female    DOB: February 11, 1971, 45 y.o.   MRN: [id number]  11/27/2015  Medication Refill   HPI This 45 y.o. female presents for evaluation of ADD.  Maintained on Adderall XR 19m daily.  Going well.  Sleep has stabilized.  No insomnia.  Blood pressure is good.  No insomnia.  If goes on and off of medication, will suffer with insomnia.  Will not take it on vacation and will suffer with insomnia when restarts.  Works for [employer] [employer] [place]  Maintained on Adderall for years; not diagnosed in school ; started taking two years ago; seeing counselor two years ago for time management.   Mother with CAD; had recent cholesterol check two years ago; really high cholesterol; must be genetic; primarily vegetarian diet.  Fish oil has helped.  Exercising.   Review of Systems  Constitutional: Negative for fever, chills, diaphoresis and fatigue.  Eyes: Negative for visual disturbance.  Respiratory: Negative for cough and shortness of breath.   Cardiovascular: Negative for chest pain, palpitations and leg swelling.  Gastrointestinal: Negative for nausea, vomiting, abdominal pain, diarrhea and constipation.  Endocrine: Negative for cold intolerance, heat intolerance, polydipsia, polyphagia and polyuria.  Neurological: Negative for dizziness, tremors, seizures, syncope, facial asymmetry, speech difficulty, weakness, light-headedness, numbness and headaches.    Past Medical History  Diagnosis Date  . Allergy   . Anxiety   . Hyperlipidemia   . ADD (attention deficit disorder)    Past Surgical History  Procedure Laterality Date  . Eye surgery    . Cesarean section     Allergies  Allergen Reactions  . Avelox [Moxifloxacin Hcl In Nacl] Other (See Comments)    Muscle cramps  . Cefaclor   . Latex    Current Outpatient Prescriptions  Medication Sig Dispense Refill  . albuterol (PROVENTIL HFA;VENTOLIN  HFA) 108 (90 BASE) MCG/ACT inhaler Inhale 2 puffs into the lungs every 6 (six) hours as needed for wheezing or shortness of breath. 1 Inhaler 0  . amphetamine-dextroamphetamine (ADDERALL) 10 MG tablet Take 10 mg once a day 30 tablet 0  . amphetamine-dextroamphetamine (ADDERALL) 10 MG tablet Take 10 mg once a day. May fill 30 days after rx 30 tablet 0  . amphetamine-dextroamphetamine (ADDERALL) 10 MG tablet Take 10 mg once a day.  Ok to fill 60 days after rx 30 tablet 0  . Boric Acid POWD Place 600 mg vaginally at bedtime. Insert in the vagina as directed.  Make up as boric acid vaginal suppository.  qhs x 1 week then twice weekly 14 g 1  . fish oil-omega-3 fatty acids 1000 MG capsule Take 2 g by mouth daily.    .Marland Kitchenloratadine (CLARITIN) 10 MG tablet Take 10 mg by mouth daily.    . Multiple Vitamins-Minerals (WOMENS MULTI VITAMIN & MINERAL) TABS Take by mouth.    . Omega-3 Fatty Acids (SALMON OIL PO) Take by mouth.    . Probiotic Product (PROBIOTIC COMPLEX ACIDOPHILUS PO) Take by mouth.    . sodium chloride (OCEAN) 0.65 % nasal spray Place 1 spray into the nose as needed.    . ranitidine (ZANTAC) 300 MG tablet Take 1 tablet (300 mg total) by mouth at bedtime. 30 tablet 1   No current facility-administered medications for this visit.   Social History   Social History  . Marital Status: Married    Spouse Name: N/A  . Number of Children: N/A  .  Years of Education: N/A   Occupational History  . Not on file.   Social History Main Topics  . Smoking status: Former Research scientist (life sciences)  . Smokeless tobacco: Not on file  . Alcohol Use: Not on file  . Drug Use: Not on file  . Sexual Activity: Not on file   Other Topics Concern  . Not on file   Social History Narrative   Marital status: married      Children: 2 children (14, 68)      Employment:    Family History  Problem Relation Age of Onset  . Heart disease Mother     CAD/stenting  . Hyperlipidemia Mother   . Arthritis Mother   . Hypertension  Mother   . Hyperlipidemia Father   . Hyperlipidemia Sister        Objective:    BP 118/74 mmHg  Pulse 57  Temp(Src) 98.1 F (36.7 C) (Oral)  Resp 18  Wt 153 lb (69.4 kg)  SpO2 98%  LMP 11/22/2015 Physical Exam  Constitutional: She is oriented to person, place, and time. She appears well-developed and well-nourished. No distress.  HENT:  Head: Normocephalic and atraumatic.  Right Ear: External ear normal.  Left Ear: External ear normal.  Nose: Nose normal.  Mouth/Throat: Oropharynx is clear and moist.  Eyes: Conjunctivae and EOM are normal. Pupils are equal, round, and reactive to light.  Neck: Normal range of motion. Neck supple. Carotid bruit is not present. No thyromegaly present.  Cardiovascular: Normal rate, regular rhythm, normal heart sounds and intact distal pulses.  Exam reveals no gallop and no friction rub.   No murmur heard. Pulmonary/Chest: Effort normal and breath sounds normal. She has no wheezes. She has no rales.  Abdominal: Soft. Bowel sounds are normal. She exhibits no distension and no mass. There is no tenderness. There is no rebound and no guarding.  Lymphadenopathy:    She has no cervical adenopathy.  Neurological: She is alert and oriented to person, place, and time. No cranial nerve deficit.  Skin: Skin is warm and dry. No rash noted. She is not diaphoretic. No erythema. No pallor.  Psychiatric: She has a normal mood and affect. Her behavior is normal.   Results for orders placed or performed in visit on 08/14/15  POCT CBC  Result Value Ref Range   WBC 3.6 (A) 4.6 - 10.2 K/uL   Lymph, poc 1.6 0.6 - 3.4   POC LYMPH PERCENT 45.8 10 - 50 %L   MID (cbc) 0.4 0 - 0.9   POC MID % 12.2 (A) 0 - 12 %M   POC Granulocyte 1.5 (A) 2 - 6.9   Granulocyte percent 42.0 37 - 80 %G   RBC 4.23 4.04 - 5.48 M/uL   Hemoglobin 12.9 12.2 - 16.2 g/dL   HCT, POC 37.4 (A) 37.7 - 47.9 %   MCV 88.5 80 - 97 fL   MCH, POC 30.6 27 - 31.2 pg   MCHC 34.6 31.8 - 35.4 g/dL    RDW, POC 14.9 %   Platelet Count, POC 253 142 - 424 K/uL   MPV 7.3 0 - 99.8 fL       Assessment & Plan:   1. ADD (attention deficit disorder)   2. Disturbed concentration   3. Family planning counseling   4. Need for prophylactic vaccination and inoculation against influenza     Orders Placed This Encounter  Procedures  . Flu Vaccine QUAD 36+ mos IM  . Ambulatory referral to Gynecology  Referral Priority:  Routine    Referral Type:  Consultation    Referral Reason:  Specialty Services Required    Requested Specialty:  Gynecology    Number of Visits Requested:  1   Meds ordered this encounter  Medications  . amphetamine-dextroamphetamine (ADDERALL) 10 MG tablet    Sig: Take 10 mg once a day    Dispense:  30 tablet    Refill:  0  . amphetamine-dextroamphetamine (ADDERALL) 10 MG tablet    Sig: Take 10 mg once a day. May fill 30 days after rx    Dispense:  30 tablet    Refill:  0    Do not fill until 12-28-15  . amphetamine-dextroamphetamine (ADDERALL) 10 MG tablet    Sig: Take 10 mg once a day.  Ok to fill 60 days after rx    Dispense:  30 tablet    Refill:  0    Do not fill until 01-27-2016    No Follow-up on file.    Jahira Swiss Elayne Guerin, M.D. [facility] [address] [place], [place]  [address] [phone] phone [phone] fax

## 2015-11-27 NOTE — Patient Instructions (Addendum)
IF you received an x-ray today, you will receive an invoice from [facility]. Please contact [facility] at [phone] with questions or concerns regarding your invoice.   IF you received labwork today, you will receive an invoice from [employer]. Please contact [facility] at [phone] with questions or concerns regarding your invoice.   Our billing staff will not be able to assist you with questions regarding bills from these companies.  You will be contacted with the lab results as soon as they are available. The fastest way to get your results is to activate your My Chart account. Instructions are located on the last page of this paperwork. If you have not heard from [place] regarding the results in 2 weeks, please contact this office.     [facility] Policy for Prescribing Controlled Substances (Revised 07/2012) 1. Prescriptions for controlled substances will be filled by ONE provider at [facility] with whom you have established and developed a plan for your care, including follow-up. 2. You are encouraged to schedule an appointment with your prescriber at our appointment center for follow-up visits whenever possible. 3. If you request a prescription for the controlled substance while at [facility] for an acute problem (with someone other than your regular prescriber), you MAY be given a ONE-TIME prescription for a 30-day supply of the controlled substance, to allow time for you to return to see your regular prescriber for additional prescriptions.  Influenza (Flu) Vaccine (Inactivated or Recombinant):  1. Why get vaccinated? Influenza ("flu") is a contagious disease that spreads around the [place] every year, usually between October and May. Flu is caused by influenza viruses, and is spread mainly by coughing, sneezing, and close contact. Anyone can get flu. Flu strikes suddenly and can last several days. Symptoms vary by age, but can  include: fever/chills sore throat muscle aches fatigue cough headache runny or stuffy nose Flu can also lead to pneumonia and blood infections, and cause diarrhea and seizures in children. If you have a medical condition, such as heart or lung disease, flu can make it worse. Flu is more dangerous for some people. Infants and young children, people 82 years of age and older, pregnant women, and people with certain health conditions or a weakened immune system are at greatest risk. Each year thousands of people in the [place] die from flu, and many more are hospitalized. Flu vaccine can: keep you from getting flu, make flu less severe if you do get it, and keep you from spreading flu to your family and other people. 2. Inactivated and recombinant flu vaccines A dose of flu vaccine is recommended every flu season. Children 6 months through 74 years of age may need two doses during the same flu season. Everyone else needs only one dose each flu season. Some inactivated flu vaccines contain a very small amount of a mercury-based preservative called thimerosal. Studies have not shown thimerosal in vaccines to be harmful, but flu vaccines that do not contain thimerosal are available. There is no live flu virus in flu shots. They cannot cause the flu. There are many flu viruses, and they are always changing. Each year a new flu vaccine is made to protect against three or four viruses that are likely to cause disease in the upcoming flu season. But even when the vaccine doesn't exactly match these viruses, it may still provide some protection. Flu vaccine cannot prevent: flu that is caused by a virus not covered by the vaccine, or  illnesses that look like flu but are not. It takes about 2 weeks for protection to develop after vaccination, and protection lasts through the flu season. 3. Some people should not get this vaccine Tell the person who is giving you the vaccine: If you have any severe,  life-threatening allergies. If you ever had a life-threatening allergic reaction after a dose of flu vaccine, or have a severe allergy to any part of this vaccine, you may be advised not to get vaccinated. Most, but not all, types of flu vaccine contain a small amount of egg protein. If you ever had Guillain-Barre Syndrome (also called GBS). Some people with a history of GBS should not get this vaccine. This should be discussed with your doctor. If you are not feeling well. It is usually okay to get flu vaccine when you have a mild illness, but you might be asked to come back when you feel better. 4. Risks of a vaccine reaction With any medicine, including vaccines, there is a chance of reactions. These are usually mild and go away on their own, but serious reactions are also possible. Most people who get a flu shot do not have any problems with it. Minor problems following a flu shot include: soreness, redness, or swelling where the shot was given hoarseness sore, red or itchy eyes cough fever aches headache itching fatigue If these problems occur, they usually begin soon after the shot and last 1 or 2 days. More serious problems following a flu shot can include the following: There may be a small increased risk of Guillain-Barre Syndrome (GBS) after inactivated flu vaccine. This risk has been estimated at 1 or 2 additional cases per million people vaccinated. This is much lower than the risk of severe complications from flu, which can be prevented by flu vaccine. Young children who get the flu shot along with pneumococcal vaccine (PCV13) and/or DTaP vaccine at the same time might be slightly more likely to have a seizure caused by fever. Ask your doctor for more information. Tell your doctor if a child who is getting flu vaccine has ever had a seizure. Problems that could happen after any injected vaccine: People sometimes faint after a medical procedure, including vaccination. Sitting or  lying down for about 15 minutes can help prevent fainting, and injuries caused by a fall. Tell your doctor if you feel dizzy, or have vision changes or ringing in the ears. Some people get severe pain in the shoulder and have difficulty moving the arm where a shot was given. This happens very rarely. Any medication can cause a severe allergic reaction. Such reactions from a vaccine are very rare, estimated at about 1 in a million doses, and would happen within a few minutes to a few hours after the vaccination. As with any medicine, there is a very remote chance of a vaccine causing a serious injury or death. The safety of vaccines is always being monitored. For more information, visit: http://www.aguilar.org/ 5. What if there is a serious reaction? What should I look for? Look for anything that concerns you, such as signs of a severe allergic reaction, very high fever, or unusual behavior. Signs of a severe allergic reaction can include hives, swelling of the face and throat, difficulty breathing, a fast heartbeat, dizziness, and weakness. These would start a few minutes to a few hours after the vaccination. What should I do? If you think it is a severe allergic reaction or other emergency that can't wait, call 9-1-1 and  get the person to the nearest hospital. Otherwise, call your doctor. Reactions should be reported to the Vaccine Adverse Event Reporting System (VAERS). Your doctor should file this report, or you can do it yourself through the VAERS web site at www.vaers.SamedayNews.es, or by calling 4785584778. VAERS does not give medical advice. 6. The National Vaccine Injury Compensation Program The [employer] Vaccine Injury Compensation Program (VICP) is a federal program that was created to compensate people who may have been injured by certain vaccines. Persons who believe they may have been injured by a vaccine can learn about the program and about filing a claim by calling 940 730 3562 or  visiting the [place] website at GoldCloset.com.ee. There is a time limit to file a claim for compensation. 7. How can I learn more? Ask your healthcare provider. He or she can give you the vaccine package insert or suggest other sources of information. Call your local or state health department. Contact the Centers for Disease Control and Prevention (CDC): Call 254-369-1126 (1-800-CDC-INFO) or Visit CDC's website at https://gibson.com/ Vaccine Information Statement Inactivated Influenza Vaccine (04/18/2014)   This information is not intended to replace advice given to you by your health care provider. Make sure you discuss any questions you have with your health care provider.   Document Released: 06/23/2006 Document Revised: 09/19/2014 Document Reviewed: 04/21/2014 Elsevier Interactive Patient Education [employer].

## 2015-12-06 NOTE — Telephone Encounter (Signed)
error 

## 2015-12-06 NOTE — Telephone Encounter (Signed)
completed

## 2016-03-07 ENCOUNTER — Telehealth: Payer: Self-pay

## 2016-03-07 NOTE — Telephone Encounter (Signed)
Patient is calling to request a refill for adderrall.  [phone]

## 2016-03-08 ENCOUNTER — Ambulatory Visit (INDEPENDENT_AMBULATORY_CARE_PROVIDER_SITE_OTHER): Payer: PRIVATE HEALTH INSURANCE | Admitting: Emergency Medicine

## 2016-03-08 ENCOUNTER — Telehealth: Payer: Self-pay | Admitting: Emergency Medicine

## 2016-03-08 ENCOUNTER — Emergency Department (HOSPITAL_COMMUNITY)
Admission: EM | Admit: 2016-03-08 | Discharge: 2016-03-09 | Disposition: A | Discharge status: Home / Self Care | Payer: PRIVATE HEALTH INSURANCE | Attending: Emergency Medicine | Admitting: Emergency Medicine

## 2016-03-08 ENCOUNTER — Emergency Department (HOSPITAL_COMMUNITY): Payer: PRIVATE HEALTH INSURANCE

## 2016-03-08 ENCOUNTER — Encounter (HOSPITAL_COMMUNITY): Payer: Self-pay | Admitting: Emergency Medicine

## 2016-03-08 VITALS — BP 122/72 | HR 85 | Temp 98.4°F | Resp 17 | Ht 67.0 in | Wt 150.0 lb

## 2016-03-08 DIAGNOSIS — R1010 Upper abdominal pain, unspecified: Secondary | ICD-10-CM

## 2016-03-08 DIAGNOSIS — E785 Hyperlipidemia, unspecified: Secondary | ICD-10-CM | POA: Insufficient documentation

## 2016-03-08 DIAGNOSIS — Z7951 Long term (current) use of inhaled steroids: Secondary | ICD-10-CM | POA: Insufficient documentation

## 2016-03-08 DIAGNOSIS — R41840 Attention and concentration deficit: Secondary | ICD-10-CM | POA: Diagnosis not present

## 2016-03-08 DIAGNOSIS — Z87891 Personal history of nicotine dependence: Secondary | ICD-10-CM | POA: Insufficient documentation

## 2016-03-08 DIAGNOSIS — R509 Fever, unspecified: Secondary | ICD-10-CM | POA: Diagnosis not present

## 2016-03-08 DIAGNOSIS — R1030 Lower abdominal pain, unspecified: Secondary | ICD-10-CM

## 2016-03-08 DIAGNOSIS — F909 Attention-deficit hyperactivity disorder, unspecified type: Secondary | ICD-10-CM | POA: Diagnosis not present

## 2016-03-08 DIAGNOSIS — F988 Other specified behavioral and emotional disorders with onset usually occurring in childhood and adolescence: Secondary | ICD-10-CM

## 2016-03-08 LAB — POCT CBC
Granulocyte percent: 75.8 %G (ref 37–80)
HCT, POC: 38.6 % (ref 37.7–47.9)
Hemoglobin: 13.8 g/dL (ref 12.2–16.2)
Lymph, poc: 1.5 (ref 0.6–3.4)
MCH, POC: 32.2 pg — AB (ref 27–31.2)
MCHC: 35.8 g/dL — AB (ref 31.8–35.4)
MCV: 89.9 fL (ref 80–97)
MID (cbc): 0.7 (ref 0–0.9)
MPV: 7.7 fL (ref 0–99.8)
POC Granulocyte: 6.7 (ref 2–6.9)
POC LYMPH PERCENT: 16.7 %L (ref 10–50)
POC MID %: 7.5 %M (ref 0–12)
Platelet Count, POC: 253 10*3/uL (ref 142–424)
RBC: 4.29 M/uL (ref 4.04–5.48)
RDW, POC: 13.6 %
WBC: 8.8 10*3/uL (ref 4.6–10.2)

## 2016-03-08 LAB — POCT URINALYSIS DIP (MANUAL ENTRY)
Bilirubin, UA: NEGATIVE
Glucose, UA: NEGATIVE
Ketones, POC UA: NEGATIVE
Leukocytes, UA: NEGATIVE
Nitrite, UA: NEGATIVE
Protein Ur, POC: NEGATIVE
Spec Grav, UA: 1.015
Urobilinogen, UA: 0.2
pH, UA: 6

## 2016-03-08 LAB — POC MICROSCOPIC URINALYSIS (UMFC): Mucus: ABSENT

## 2016-03-08 LAB — COMPLETE METABOLIC PANEL WITH GFR
ALT: 11 U/L (ref 6–29)
AST: 17 U/L (ref 10–30)
Albumin: 4.4 g/dL (ref 3.6–5.1)
Alkaline Phosphatase: 42 U/L (ref 33–115)
BUN: 11 mg/dL (ref 7–25)
CO2: 25 mmol/L (ref 20–31)
Calcium: 9.4 mg/dL (ref 8.6–10.2)
Chloride: 99 mmol/L (ref 98–110)
Creat: 0.8 mg/dL (ref 0.50–1.10)
GFR, Est African American: >89 mL/min (ref >=60)
GFR, Est Non African American: >89 mL/min (ref >=60)
Glucose, Bld: 86 mg/dL (ref 65–99)
Potassium: 4.1 mmol/L (ref 3.5–5.3)
Sodium: 135 mmol/L (ref 135–146)
Total Bilirubin: 0.6 mg/dL (ref 0.2–1.2)
Total Protein: 7.1 g/dL (ref 6.1–8.1)

## 2016-03-08 LAB — LIPASE: Lipase: 19 U/L (ref 7–60)

## 2016-03-08 LAB — POCT URINE PREGNANCY: Preg Test, Ur: NEGATIVE

## 2016-03-08 MED ORDER — AMPHETAMINE-DEXTROAMPHETAMINE 10 MG PO TABS: ORAL_TABLET | ORAL | Status: DC

## 2016-03-08 MED ORDER — IOPAMIDOL (ISOVUE-300) INJECTION 61%
100.0000 mL | Freq: Once | INTRAVENOUS | Status: AC | PRN
  Administered 2016-03-08: 100 mL via INTRAVENOUS

## 2016-03-08 MED ORDER — AMPHETAMINE-DEXTROAMPHETAMINE 10 MG PO TABS
ORAL_TABLET | ORAL | Status: DC
Start: 2016-03-08 — End: 2016-03-08

## 2016-03-08 MED ORDER — SODIUM CHLORIDE 0.9 % IV BOLUS (SEPSIS)
1000.0000 mL | Freq: Once | INTRAVENOUS | Status: AC
  Administered 2016-03-08: 1000 mL via INTRAVENOUS

## 2016-03-08 MED ORDER — ONDANSETRON HCL 4 MG/2ML IJ SOLN
4.0000 mg | Freq: Once | INTRAMUSCULAR | Status: AC
  Administered 2016-03-08: 4 mg via INTRAVENOUS
  Filled 2016-03-08: qty 2

## 2016-03-08 MED ORDER — MORPHINE SULFATE (PF) 4 MG/ML IV SOLN
4.0000 mg | Freq: Once | INTRAVENOUS | Status: AC
  Administered 2016-03-08: 4 mg via INTRAVENOUS
  Filled 2016-03-08: qty 1

## 2016-03-08 MED ORDER — OMEPRAZOLE 40 MG PO CPDR: 40.0000 mg | DELAYED_RELEASE_CAPSULE | Freq: Every day | ORAL | Status: DC

## 2016-03-08 MED ORDER — ACETAMINOPHEN 325 MG PO TABS
650.0000 mg | ORAL_TABLET | Freq: Once | ORAL | Status: AC
  Administered 2016-03-09: 650 mg via ORAL
  Filled 2016-03-08: qty 2

## 2016-03-08 NOTE — ED Provider Notes (Signed)
CSN: [id number]     Arrival date & time 03/08/16  2213 History   First MD Initiated Contact with Patient 03/08/16 2258     Chief Complaint  Patient presents with  . Abdominal Pain  . Fever     (Consider location/radiation/quality/duration/timing/severity/associated sxs/prior Treatment) HPI Comments: Patient is a 45 year old female with a history of anxiety, ADHD who drinks approximately 1-2 beers daily presenting with worsening abdominal pain. Patient states for the last 1-2 weeks she has had mild crampy upper abdominal pain. She saw the doctor today and it was felt this is most likely gastritis as she been taking more ibuprofen for recent dental infection. However as the day progressed she started to feel worse. The pain moved to now encompass the lower abdomen. Mostly the right lower quadrant and middle abdomen. She also developed a fever of 101. Her lab work from earlier today was all within normal limits. The pain she was having earlier would get worse when she was hungry. After eating the pain would be spasmodic and worse for a while and then improved. She's been taking a PPI without much improvement. She denies any suspicious food intake and she has not been around anybody else who has been ill. She does not typically have abdominal pain and other than 2 C-sections is in no other surgeries on her abdomen.  Patient is a 45 y.o. female presenting with abdominal pain and fever. The history is provided by the patient.  Abdominal Pain Pain location:  Suprapubic and RLQ Pain quality: bloating, cramping, gnawing and shooting   Pain radiates to:  Does not radiate Pain severity:  Moderate Onset quality:  Gradual Timing:  Constant Progression:  Worsening Chronicity:  New Context: not medication withdrawal, not previous surgeries, not sick contacts and not trauma   Relieved by:  Nothing Worsened by:  Eating Ineffective treatments:  None tried Associated symptoms: fever and nausea   Associated  symptoms: no cough, no diarrhea, no dysuria, no shortness of breath, no vaginal bleeding, no vaginal discharge and no vomiting   Fever Associated symptoms: nausea   Associated symptoms: no cough, no diarrhea, no dysuria and no vomiting     Past Medical History  Diagnosis Date  . Allergy   . Anxiety   . Hyperlipidemia   . ADD (attention deficit disorder)    Past Surgical History  Procedure Laterality Date  . Eye surgery    . Cesarean section     Family History  Problem Relation Age of Onset  . Heart disease Mother     CAD/stenting  . Hyperlipidemia Mother   . Arthritis Mother   . Hypertension Mother   . Hyperlipidemia Father   . Hyperlipidemia Sister    Social History  Substance Use Topics  . Smoking status: Former Research scientist (life sciences)  . Smokeless tobacco: None  . Alcohol Use: 1.2 oz/week    2 Cans of beer per week     Comment: 2 beers daily   OB History    No data available     Review of Systems  Constitutional: Positive for fever.  Respiratory: Negative for cough and shortness of breath.   Gastrointestinal: Positive for nausea and abdominal pain. Negative for vomiting and diarrhea.  Genitourinary: Negative for dysuria, vaginal bleeding and vaginal discharge.  All other systems reviewed and are negative.     Allergies  Avelox; Cefaclor; and Latex  Home Medications   Prior to Admission medications   Medication Sig Start Date End Date Taking? Authorizing Provider  amphetamine-dextroamphetamine (ADDERALL) 10 MG tablet Take 10 mg once a day 03/08/16  Yes Darlyne Russian, MD  diphenhydrAMINE (BENADRYL) 25 MG tablet Take 25 mg by mouth at bedtime.   Yes Historical Provider, MD  loratadine (CLARITIN) 10 MG tablet Take 10 mg by mouth daily.   Yes Historical Provider, MD  Multiple Minerals-Vitamins (CITRACAL PLUS PO) Take 1 packet by mouth daily. Reported on 03/08/2016   Yes Historical Provider, MD  Multiple Vitamins-Minerals (WOMENS MULTI VITAMIN & MINERAL) TABS Take 1 tablet by  mouth daily.    Yes Historical Provider, MD  Omega-3 Fatty Acids (SALMON OIL PO) Take 1 capsule by mouth daily.    Yes Historical Provider, MD  omeprazole (PRILOSEC) 40 MG capsule Take 1 capsule (40 mg total) by mouth daily. 03/08/16  Yes Darlyne Russian, MD  Probiotic Product (PROBIOTIC COMPLEX ACIDOPHILUS PO) Take 1 capsule by mouth daily.    Yes Historical Provider, MD  albuterol (PROVENTIL HFA;VENTOLIN HFA) 108 (90 BASE) MCG/ACT inhaler Inhale 2 puffs into the lungs every 6 (six) hours as needed for wheezing or shortness of breath. Patient not taking: Reported on 03/08/2016 08/14/15   Darreld Mclean, MD  amphetamine-dextroamphetamine (ADDERALL) 10 MG tablet Take 10 mg once a day. May fill 30 days after rx Patient not taking: Reported on 03/08/2016 03/08/16   Darlyne Russian, MD  amphetamine-dextroamphetamine (ADDERALL) 10 MG tablet Take 10 mg once a day.  Ok to fill 60 days after rx Patient not taking: Reported on 03/08/2016 03/08/16   Darlyne Russian, MD  Boric Acid POWD Place 600 mg vaginally at bedtime. Insert in the vagina as directed.  Make up as boric acid vaginal suppository.  qhs x 1 week then twice weekly Patient not taking: Reported on 03/08/2016 09/08/15   Robyn Haber, MD  sodium chloride (OCEAN) 0.65 % nasal spray Place 1 spray into the nose daily as needed for congestion.     Historical Provider, MD   BP 124/77 mmHg  Pulse 100  Temp(Src) 101 F (38.3 C) (Oral)  Resp 16  Ht 5' 6"  (1.676 m)  Wt 149 lb (67.586 kg)  BMI 24.06 kg/m2  SpO2 99%  LMP 03/01/2016 Physical Exam  Constitutional: She is oriented to person, place, and time. She appears well-developed and well-nourished. No distress.  HENT:  Head: Normocephalic and atraumatic.  Mouth/Throat: Oropharynx is clear and moist.  Eyes: Conjunctivae and EOM are normal. Pupils are equal, round, and reactive to light.  Neck: Normal range of motion. Neck supple.  Cardiovascular: Normal rate, regular rhythm and intact distal pulses.    No murmur heard. Pulmonary/Chest: Effort normal and breath sounds normal. No respiratory distress. She has no wheezes. She has no rales.  Abdominal: Soft. She exhibits no distension. There is tenderness in the right lower quadrant and suprapubic area. There is no rebound and no guarding.  Musculoskeletal: Normal range of motion. She exhibits no edema or tenderness.  Neurological: She is alert and oriented to person, place, and time.  Skin: Skin is warm and dry. No rash noted. No erythema.  Psychiatric: She has a normal mood and affect. Her behavior is normal.  Nursing note and vitals reviewed.   ED Course  Procedures (including critical care time) Labs Review Labs Reviewed - No data to display  Imaging Review Ct Abdomen Pelvis W Contrast  03/09/2016  CLINICAL DATA:  Worsening abdominal pain and fever. EXAM: CT ABDOMEN AND PELVIS WITH CONTRAST TECHNIQUE: Multidetector CT imaging of the abdomen and pelvis  was performed using the standard protocol following bolus administration of intravenous contrast. CONTRAST:  154m ISOVUE-300 IOPAMIDOL (ISOVUE-300) INJECTION 61% COMPARISON:  None. FINDINGS: Lower chest:  No significant abnormality Hepatobiliary: There are normal appearances of the liver, gallbladder and bile ducts. Pancreas: Normal Spleen: Normal Adrenals/Urinary Tract: The adrenals and kidneys are normal in appearance. There is no urinary calculus evident. There is no hydronephrosis or ureteral dilatation. Collecting systems and ureters appear unremarkable. Stomach/Bowel: The appendix is normal. There is abnormal enhancement and mural thickening of the terminal ileum. This may represent terminal ileitis. Remainder of the small bowel is unremarkable. Colon is unremarkable. Vascular/Lymphatic: There are prominent right lower quadrant mesenteric lymph nodes. No adenopathy elsewhere. The abdominal aorta is normal in caliber without atherosclerotic calcification. Reproductive: Uterus and ovaries are  unremarkable. Other: No extraluminal air. No bowel obstruction. Scant volume free pelvic fluid. Musculoskeletal: No significant skeletal lesion. IMPRESSION: 1. Normal appendix 2. Abnormal terminal ileum. Question terminal ileitis. There also are moderately prominent right lower quadrant mesenteric nodes measuring up to 16 mm short axis. 3. No bowel obstruction or perforation Electronically Signed   By: DAndreas NewportM.D.   On: 03/09/2016 00:34   I have personally reviewed and evaluated these images and lab results as part of my medical decision-making.   EKG Interpretation None      MDM   Final diagnoses:  Lower abdominal pain    Patient is a 45year old presenting today with worsening abdominal pain. She states she has had intermittent abdominal pain for the last 2 weeks but in the last 3 days the pain is become significantly worse. Today she developed a fever of 101 and pain is now localized in the right lower quadrant. She had blood testing done earlier today at urgent care which was all within normal limits. CBC, CMP, UA, urine pregnancy test all normal. She did have H pylori done which is pending. She states she had had dental issue so was taking more ibuprofen and they initially thought gastritis. She been taking over-the-counter PPI without improvement. Nausea without vomiting. No significant stool changes. She denies any urinary symptoms. Based on exam concern for possible appendicitis and a CT was ordered. Patient given IV fluids, pain and nausea medication.  1:17 AM CT showed a normal appendix. Concern for possible terminal ileitis with mesenteric lymph nodes present in the right lower quadrant. Patient's symptoms are most likely viral in nature. Findings discussed with the patient. She is feeling much better after 1 round of meds. She'll be discharged home with pain medicine to take when necessary and bland diet. She has follow-up.  WBlanchie Dessert MD 03/09/16 [phone]

## 2016-03-08 NOTE — Telephone Encounter (Signed)
I called and spoke with patient. Apparently this afternoon she started feeling chilled and running a fever. She has had nausea but no actual vomiting. Her pain is now periumbilical and more to the right. I advised her I was concerned she could have acute appendicitis. I told her earlier today her urine white count were normal with no fever but this could've been early appendicitis. She is agreeable to go to the emergency room at [facility]. I called [facility] and they will be happy to see and evaluate her with probable CT scanning. Pregnancy test earlier today was negative.

## 2016-03-08 NOTE — Patient Instructions (Addendum)
IF you received an x-ray today, you will receive an invoice from [facility]. Please contact [facility] at [phone] with questions or concerns regarding your invoice.   IF you received labwork today, you will receive an invoice from [employer]. Please contact [facility] at [phone] with questions or concerns regarding your invoice.   Our billing staff will not be able to assist you with questions regarding bills from these companies.  You will be contacted with the lab results as soon as they are available. The fastest way to get your results is to activate your My Chart account. Instructions are located on the last page of this paperwork. If you have not heard from [place] regarding the results in 2 weeks, please contact this office.     Gastritis, Adult Gastritis is soreness and swelling (inflammation) of the lining of the stomach. Gastritis can develop as a sudden onset (acute) or long-term (chronic) condition. If gastritis is not treated, it can lead to stomach bleeding and ulcers. CAUSES  Gastritis occurs when the stomach lining is weak or damaged. Digestive juices from the stomach then inflame the weakened stomach lining. The stomach lining may be weak or damaged due to viral or bacterial infections. One common bacterial infection is the Helicobacter pylori infection. Gastritis can also result from excessive alcohol consumption, taking certain medicines, or having too much acid in the stomach.  SYMPTOMS  In some cases, there are no symptoms. When symptoms are present, they may include:  Pain or a burning sensation in the upper abdomen.  Nausea.  Vomiting.  An uncomfortable feeling of fullness after eating. DIAGNOSIS  Your caregiver may suspect you have gastritis based on your symptoms and a physical exam. To determine the cause of your gastritis, your caregiver may perform the following:  Blood or stool tests to check for the H  pylori bacterium.  Gastroscopy. A thin, flexible tube (endoscope) is passed down the esophagus and into the stomach. The endoscope has a light and camera on the end. Your caregiver uses the endoscope to view the inside of the stomach.  Taking a tissue sample (biopsy) from the stomach to examine under a microscope. TREATMENT  Depending on the cause of your gastritis, medicines may be prescribed. If you have a bacterial infection, such as an H pylori infection, antibiotics may be given. If your gastritis is caused by too much acid in the stomach, H2 blockers or antacids may be given. Your caregiver may recommend that you stop taking aspirin, ibuprofen, or other nonsteroidal anti-inflammatory drugs (NSAIDs). HOME CARE INSTRUCTIONS  Only take over-the-counter or prescription medicines as directed by your caregiver.  If you were given antibiotic medicines, take them as directed. Finish them even if you start to feel better.  Drink enough fluids to keep your urine clear or pale yellow.  Avoid foods and drinks that make your symptoms worse, such as:  Caffeine or alcoholic drinks.  Chocolate.  Peppermint or mint flavorings.  Garlic and onions.  Spicy foods.  Citrus fruits, such as oranges, lemons, or limes.  Tomato-based foods such as sauce, chili, salsa, and pizza.  Fried and fatty foods.  Eat small, frequent meals instead of large meals. SEEK IMMEDIATE MEDICAL CARE IF:   You have black or dark red stools.  You vomit blood or material that looks like coffee grounds.  You are unable to keep fluids down.  Your abdominal pain gets worse.  You have a fever.  You do not feel  better after 1 week.  You have any other questions or concerns. MAKE SURE YOU:  Understand these instructions.  Will watch your condition.  Will get help right away if you are not doing well or get worse.   This information is not intended to replace advice given to you by your health care provider.  Make sure you discuss any questions you have with your health care provider.   Document Released: 08/23/2001 Document Revised: 02/28/2012 Document Reviewed: 10/12/2011 Elsevier Interactive Patient Education [employer].

## 2016-03-08 NOTE — Progress Notes (Signed)
By signing my name below, I, Robin [name], attest that this documentation has been prepared under the direction and in the presence of Robin Queen, MD. Electronically Signed: Moises [name], Robin [name]. 03/08/2016 , 9:46 AM .  Patient was seen in room 5 .  Chief Complaint:  Chief Complaint  Patient presents with  . Abdominal Pain  . Medication Refill    ADDERRALL    HPI: Robin [name] is a 45 y.o. female who reports to [facility] today complaining of abdominal pain that started over the last week. She noticed sharp pains before and after eating. She thought it was constipation and took laxatives. She mentions her dinner last night caused sharp pain. She informs dental issue prior and had to take ibuprofen for 3 months. She made sure she ate enough with the ibuprofen. She noticed occasional red [name] in her stool but believes it could be due to hemorrhoids. She denies black tarry stools. She denies history of ulcer. She denies smoking. She denies history of gall stones. Her mother has had an ulcer in the past.   She hasn't made an appointment with GYN yet. She has regular period. Her last period ended last week.   She also requested medication refill of her adderall.   Past Medical History  Diagnosis Date  . Allergy   . Anxiety   . Hyperlipidemia   . ADD (attention deficit disorder)    Past Surgical History  Procedure Laterality Date  . Eye surgery    . Cesarean section     Social History   Social History  . Marital Status: Married    Spouse Name: N/A  . Number of Children: N/A  . Years of Education: N/A   Social History Main Topics  . Smoking status: Former Research scientist (life sciences)  . Smokeless tobacco: None  . Alcohol Use: None  . Drug Use: None  . Sexual Activity: Not Asked   Other Topics Concern  . None   Social History Narrative   Marital status: married      Children: 2 children (14, 73)      Employment:    Family History  Problem Relation Age of Onset  . Heart disease  Mother     CAD/stenting  . Hyperlipidemia Mother   . Arthritis Mother   . Hypertension Mother   . Hyperlipidemia Father   . Hyperlipidemia Sister    Allergies  Allergen Reactions  . Avelox [Moxifloxacin Hcl In Nacl] Other (See Comments)    Muscle cramps  . Cefaclor   . Latex    Prior to Admission medications   Medication Sig Start Date End Date Taking? Authorizing Provider  albuterol (PROVENTIL HFA;VENTOLIN HFA) 108 (90 BASE) MCG/ACT inhaler Inhale 2 puffs into the lungs every 6 (six) hours as needed for wheezing or shortness of breath. 08/14/15  Yes Darreld Mclean, MD  amphetamine-dextroamphetamine (ADDERALL) 10 MG tablet Take 10 mg once a day 11/27/15  Yes Wardell Honour, MD  amphetamine-dextroamphetamine (ADDERALL) 10 MG tablet Take 10 mg once a day. May fill 30 days after rx 11/27/15  Yes Wardell Honour, MD  amphetamine-dextroamphetamine (ADDERALL) 10 MG tablet Take 10 mg once a day.  Ok to fill 60 days after rx 11/27/15  Yes Wardell Honour, MD  Boric Acid POWD Place 600 mg vaginally at bedtime. Insert in the vagina as directed.  Make up as boric acid vaginal suppository.  qhs x 1 week then twice weekly 09/08/15  Yes Robyn Haber, MD  fish oil-omega-3 fatty acids 1000 MG capsule Take 2 g by mouth daily.   Yes Historical Provider, MD  loratadine (CLARITIN) 10 MG tablet Take 10 mg by mouth daily.   Yes Historical Provider, MD  Multiple Minerals-Vitamins (CITRACAL PLUS PO) Take by mouth.   Yes Historical Provider, MD  Multiple Vitamins-Minerals (WOMENS MULTI VITAMIN & MINERAL) TABS Take by mouth.   Yes Historical Provider, MD  Omega-3 Fatty Acids (SALMON OIL PO) Take by mouth.   Yes Historical Provider, MD  Probiotic Product (PROBIOTIC COMPLEX ACIDOPHILUS PO) Take by mouth.   Yes Historical Provider, MD  sodium chloride (OCEAN) 0.65 % nasal spray Place 1 spray into the nose as needed.   Yes Historical Provider, MD     ROS:  Constitutional: negative for fever, chills, night sweats,  weight changes, or fatigue  HEENT: negative for vision changes, hearing loss, congestion, rhinorrhea, ST, epistaxis, or sinus pressure Cardiovascular: negative for chest pain or palpitations Respiratory: negative for hemoptysis, wheezing, shortness of breath, or cough Abdominal: negative for nausea, vomiting, diarrhea, or constipation; positive for abdominal pain, [name] in stool Dermatological: negative for rash Neurologic: negative for headache, dizziness, or syncope All other systems reviewed and are otherwise negative with the exception to those above and in the HPI.  PHYSICAL EXAM: Filed Vitals:   03/08/16 0904  BP: 122/72  Pulse: 85  Temp: 98.4 F (36.9 C)  Resp: 17   Body mass index is 23.49 kg/(m^2).   General: Alert, no acute distress HEENT:  Normocephalic, atraumatic, oropharynx patent. Eye: Robin [name] Robin [name] Cardiovascular:  Regular rate and rhythm, no rubs murmurs or gallops.  No Carotid bruits, radial pulse intact. No pedal edema.  Respiratory: Clear to auscultation bilaterally.  No wheezes, rales, or rhonchi.  No cyanosis, no use of accessory musculature Abdominal: flat with normal bowel sounds, tender mid epigastrium and mild suprapubic  Musculoskeletal: Gait intact. No edema, tenderness Skin: No rashes. Neurologic: Facial musculature symmetric. Psychiatric: Patient acts appropriately throughout our interaction.  Lymphatic: No cervical or submandibular lymphadenopathy Genitourinary/Anorectal: No acute findings  LABS: Results for orders placed or performed in visit on 03/08/16  POCT CBC  Result Value Ref Range   WBC 8.8 4.6 - 10.2 K/uL   Lymph, poc 1.5 0.6 - 3.4   POC LYMPH PERCENT 16.7 10 - 50 %L   MID (cbc) 0.7 0 - 0.9   POC MID % 7.5 0 - 12 %M   POC Granulocyte 6.7 2 - 6.9   Granulocyte percent 75.8 37 - 80 %G   RBC 4.29 4.04 - 5.48 M/uL   Hemoglobin 13.8 12.2 - 16.2 g/dL   HCT, POC 38.6 37.7 - 47.9 %   MCV 89.9 80 - 97 fL   MCH, POC 32.2 (A) 27 - 31.2 pg     MCHC 35.8 (A) 31.8 - 35.4 g/dL   RDW, POC 13.6 %   Platelet Count, POC 253 142 - 424 K/uL   MPV 7.7 0 - 99.8 fL  POCT urinalysis dipstick  Result Value Ref Range   Color, UA yellow yellow   Clarity, UA clear clear   Glucose, UA negative negative   Bilirubin, UA negative negative   Ketones, POC UA negative negative   Spec Grav, UA 1.015    [name], UA trace-lysed (A) negative   pH, UA 6.0    Protein Ur, POC negative negative   Urobilinogen, UA 0.2    Nitrite, UA Negative Negative   Leukocytes, UA Negative Negative  POCT Microscopic Urinalysis ([facility])  Result Value Ref Range   WBC,UR,HPF,POC None None WBC/hpf   RBC,UR,HPF,POC None None RBC/hpf   Bacteria None None, Too numerous to count   Mucus Absent Absent   Epithelial Cells, UR Per Microscopy Few (A) None, Too numerous to count cells/hpf  POCT urine pregnancy  Result Value Ref Range   Preg Test, Ur Negative Negative   Meds ordered this encounter  Medications  . Multiple Minerals-Vitamins (CITRACAL PLUS PO)    Sig: Take by mouth.  . DISCONTD: amphetamine-dextroamphetamine (ADDERALL) 10 MG tablet    Sig: Take 10 mg once a day    Dispense:  30 tablet    Refill:  0  . DISCONTD: amphetamine-dextroamphetamine (ADDERALL) 10 MG tablet    Sig: Take 10 mg once a day. May fill 30 days after rx    Dispense:  30 tablet    Refill:  0    Do not fill until 12-28-15  . DISCONTD: amphetamine-dextroamphetamine (ADDERALL) 10 MG tablet    Sig: Take 10 mg once a day.  Ok to fill 60 days after rx    Dispense:  30 tablet    Refill:  0    Do not fill until 01-27-2016  . DISCONTD: amphetamine-dextroamphetamine (ADDERALL) 10 MG tablet    Sig: Take 10 mg once a day    Dispense:  30 tablet    Refill:  0  . amphetamine-dextroamphetamine (ADDERALL) 10 MG tablet    Sig: Take 10 mg once a day. May fill 30 days after rx    Dispense:  30 tablet    Refill:  0  . amphetamine-dextroamphetamine (ADDERALL) 10 MG tablet    Sig: Take 10 mg once a day.   Ok to fill 60 days after rx    Dispense:  30 tablet    Refill:  0  . amphetamine-dextroamphetamine (ADDERALL) 10 MG tablet    Sig: Take 10 mg once a day    Dispense:  30 tablet    Refill:  0  . omeprazole (PRILOSEC) 40 MG capsule    Sig: Take 1 capsule (40 mg total) by mouth daily.    Dispense:  30 capsule    Refill:  3    EKG/XRAY:     ASSESSMENT/PLAN: Labs are good. Awaiting helicobacter titer.t. Patient to be scheduled for ultrasound of the gallbladder. If symptoms persist she may need endoscopy. She is to avoid all nonsteroidals. Will treat with Prilosec 40 one a day.I personally performed the services described in this documentation, which was scribed in my presence. The recorded information has been reviewed and is accurate.  Gross sideeffects, risk and benefits, and alternatives of medications d/w patient. Patient is aware that all medications have potential sideeffects and we are unable to predict every sideeffect or drug-drug interaction that may occur.  Robin Queen MD 03/08/2016 9:33 AM

## 2016-03-08 NOTE — ED Notes (Signed)
Pt presents with worsening abdominal pain and fever.  Pt was being seen by Dr. Josepha Pigg and he sent her here after her abdominal pain (diagnosed as gastritis this AM) moved from the epigastric area to lower.  Pt states she has been nauseated and has had a headache without emesis.  Labs were already done and U/A was negative.

## 2016-03-09 LAB — H. PYLORI BREATH TEST: H. pylori Breath Test: NOT DETECTED

## 2016-03-09 MED ORDER — HYDROCODONE-ACETAMINOPHEN 5-325 MG PO TABS
1.0000 | ORAL_TABLET | ORAL | Status: DC | PRN
Start: 2016-03-09 — End: 2016-03-18

## 2016-03-09 NOTE — Discharge Instructions (Signed)

## 2016-03-10 NOTE — Telephone Encounter (Signed)
Pt came for OV on 6/27 and got Rxs.

## 2016-03-11 ENCOUNTER — Encounter (HOSPITAL_COMMUNITY): Payer: Self-pay

## 2016-03-11 ENCOUNTER — Emergency Department (HOSPITAL_COMMUNITY): Payer: PRIVATE HEALTH INSURANCE

## 2016-03-11 ENCOUNTER — Emergency Department (HOSPITAL_COMMUNITY)
Admission: EM | Admit: 2016-03-11 | Discharge: 2016-03-11 | Disposition: A | Discharge status: Home / Self Care | Payer: PRIVATE HEALTH INSURANCE | Attending: Emergency Medicine | Admitting: Emergency Medicine

## 2016-03-11 ENCOUNTER — Telehealth: Payer: Self-pay | Admitting: Emergency Medicine

## 2016-03-11 DIAGNOSIS — R1031 Right lower quadrant pain: Secondary | ICD-10-CM | POA: Diagnosis present

## 2016-03-11 DIAGNOSIS — Z79899 Other long term (current) drug therapy: Secondary | ICD-10-CM | POA: Insufficient documentation

## 2016-03-11 DIAGNOSIS — E785 Hyperlipidemia, unspecified: Secondary | ICD-10-CM | POA: Insufficient documentation

## 2016-03-11 DIAGNOSIS — K5000 Crohn's disease of small intestine without complications: Secondary | ICD-10-CM | POA: Insufficient documentation

## 2016-03-11 DIAGNOSIS — F909 Attention-deficit hyperactivity disorder, unspecified type: Secondary | ICD-10-CM | POA: Diagnosis not present

## 2016-03-11 DIAGNOSIS — Z87891 Personal history of nicotine dependence: Secondary | ICD-10-CM | POA: Diagnosis not present

## 2016-03-11 LAB — CBC WITH DIFFERENTIAL/PLATELET
Basophils Absolute: 0 10*3/uL (ref 0.0–0.1)
Basophils Relative: 0 %
Eosinophils Absolute: 0 10*3/uL (ref 0.0–0.7)
Eosinophils Relative: 0 %
HCT: 36.3 % (ref 36.0–46.0)
Hemoglobin: 12.3 g/dL (ref 12.0–15.0)
Lymphocytes Relative: 6 %
Lymphs Abs: 0.8 10*3/uL (ref 0.7–4.0)
MCH: 31.2 pg (ref 26.0–34.0)
MCHC: 33.9 g/dL (ref 30.0–36.0)
MCV: 92.1 fL (ref 78.0–100.0)
Monocytes Absolute: 1.9 10*3/uL — ABNORMAL HIGH (ref 0.1–1.0)
Monocytes Relative: 14 %
Neutro Abs: 11.4 10*3/uL — ABNORMAL HIGH (ref 1.7–7.7)
Neutrophils Relative %: 80 %
Platelets: 264 10*3/uL (ref 150–400)
RBC: 3.94 MIL/uL (ref 3.87–5.11)
RDW: 13.2 % (ref 11.5–15.5)
WBC: 14.1 10*3/uL — ABNORMAL HIGH (ref 4.0–10.5)

## 2016-03-11 LAB — LIPASE, BLOOD: Lipase: 17 U/L (ref 11–51)

## 2016-03-11 LAB — COMPREHENSIVE METABOLIC PANEL
ALT: 14 U/L (ref 14–54)
AST: 21 U/L (ref 15–41)
Albumin: 4 g/dL (ref 3.5–5.0)
Alkaline Phosphatase: 47 U/L (ref 38–126)
Anion gap: 8 (ref 5–15)
BUN: 9 mg/dL (ref 6–20)
CO2: 26 mmol/L (ref 22–32)
Calcium: 8.6 mg/dL — ABNORMAL LOW (ref 8.9–10.3)
Chloride: 101 mmol/L (ref 101–111)
Creatinine, Ser: 0.63 mg/dL (ref 0.44–1.00)
GFR calc Af Amer: >60 mL/min (ref >60)
GFR calc non Af Amer: >60 mL/min (ref >60)
Glucose, Bld: 89 mg/dL (ref 65–99)
Potassium: 3.4 mmol/L — ABNORMAL LOW (ref 3.5–5.1)
Sodium: 135 mmol/L (ref 135–145)
Total Bilirubin: 0.5 mg/dL (ref 0.3–1.2)
Total Protein: 7.5 g/dL (ref 6.5–8.1)

## 2016-03-11 LAB — URINALYSIS, ROUTINE W REFLEX MICROSCOPIC
Bilirubin Urine: NEGATIVE
Glucose, UA: NEGATIVE mg/dL
Ketones, ur: 15 mg/dL — AB
Leukocytes, UA: NEGATIVE
Nitrite: NEGATIVE
Protein, ur: NEGATIVE mg/dL
Specific Gravity, Urine: 1.005 (ref 1.005–1.030)
pH: 6.5 (ref 5.0–8.0)

## 2016-03-11 LAB — URINE MICROSCOPIC-ADD ON

## 2016-03-11 LAB — I-STAT BETA HCG BLOOD, ED (MC, WL, AP ONLY): I-stat hCG, quantitative: <5 m[IU]/mL (ref <5)

## 2016-03-11 MED ORDER — IOPAMIDOL (ISOVUE-300) INJECTION 61%
100.0000 mL | Freq: Once | INTRAVENOUS | Status: AC | PRN
  Administered 2016-03-11: 100 mL via INTRAVENOUS

## 2016-03-11 MED ORDER — AMOXICILLIN-POT CLAVULANATE 875-125 MG PO TABS: 1.0000 | ORAL_TABLET | Freq: Two times a day (BID) | ORAL | Status: DC

## 2016-03-11 MED ORDER — MORPHINE SULFATE (PF) 4 MG/ML IV SOLN
4.0000 mg | Freq: Once | INTRAVENOUS | Status: AC
  Administered 2016-03-11: 4 mg via INTRAVENOUS
  Filled 2016-03-11: qty 1

## 2016-03-11 MED ORDER — OXYCODONE HCL 5 MG PO TABS: 2.5000 mg | ORAL_TABLET | ORAL | Status: DC | PRN

## 2016-03-11 MED ORDER — IBUPROFEN 200 MG PO TABS
400.0000 mg | ORAL_TABLET | Freq: Once | ORAL | Status: AC
  Administered 2016-03-11: 400 mg via ORAL
  Filled 2016-03-11: qty 2

## 2016-03-11 MED ORDER — METOCLOPRAMIDE HCL 5 MG/ML IJ SOLN
5.0000 mg | Freq: Once | INTRAMUSCULAR | Status: AC
  Administered 2016-03-11: 5 mg via INTRAVENOUS
  Filled 2016-03-11: qty 2

## 2016-03-11 MED ORDER — DICYCLOMINE HCL 10 MG PO CAPS: ORAL_CAPSULE | ORAL | Status: DC

## 2016-03-11 MED ORDER — METOCLOPRAMIDE HCL 5 MG PO TABS: 5.0000 mg | ORAL_TABLET | Freq: Four times a day (QID) | ORAL | Status: DC | PRN

## 2016-03-11 MED ORDER — SODIUM CHLORIDE 0.9 % IV BOLUS (SEPSIS)
1000.0000 mL | Freq: Once | INTRAVENOUS | Status: AC
  Administered 2016-03-11: 1000 mL via INTRAVENOUS

## 2016-03-11 MED ORDER — METOCLOPRAMIDE HCL 5 MG/5ML PO SOLN
5.0000 mg | Freq: Three times a day (TID) | ORAL | Status: DC
  Filled 2016-03-11: qty 5

## 2016-03-11 MED ORDER — DIATRIZOATE MEGLUMINE & SODIUM 66-10 % PO SOLN
30.0000 mL | Freq: Once | ORAL | Status: AC
  Administered 2016-03-11: 30 mL via ORAL

## 2016-03-11 MED ORDER — SODIUM CHLORIDE 0.9 % IV SOLN
3.0000 g | Freq: Once | INTRAVENOUS | Status: AC
  Administered 2016-03-11: 3 g via INTRAVENOUS
  Filled 2016-03-11: qty 3

## 2016-03-11 MED ORDER — ONDANSETRON HCL 4 MG/2ML IJ SOLN
4.0000 mg | Freq: Once | INTRAMUSCULAR | Status: AC
  Administered 2016-03-11: 4 mg via INTRAVENOUS
  Filled 2016-03-11: qty 2

## 2016-03-11 NOTE — ED Notes (Signed)
Patient c/o abdominal pain and pressure that has been consistant since last seen in [place] on Tuesday.  Patient states that has been running a fever mostly at night.  Patient states that has been taking tylenol for fever.  Last took tylenol today at 0200 this morning and woke with a fever of 103.5 at 3:30.  Patient was advised by PCP to come to ED for reevaluation.  Patient states that has not been able to keep foods or fluids down.  Patient rates pain 6/10

## 2016-03-11 NOTE — ED Provider Notes (Signed)
CSN: [id number]     Arrival date & time 03/11/16  0436 History   First MD Initiated Contact with Patient 03/11/16 0725     Chief Complaint  Patient presents with  . Abdominal Pain     (Consider location/radiation/quality/duration/timing/severity/associated sxs/prior Treatment) HPI  This is a 45 year old female who presents emergency Department with chief complaint of fever and abdominal pain. The patient was seen 3 days ago for the same complaint. She has had worsening abdominal pain over the past month. It seems to be associated with eating. She has difficulty localizing the pain. Patient had a CT scan on 627 that showed normal appendix, however, there was concern for terminal ileitis with mesenteric lymph nodes, thought to be secondary to viral infection. The patient states that since that time she's had waxing and waning pain. However, her fever has become worse with a MAXIMUM TEMPERATURE of 103.1 . Early this morning, unrelieved with Tylenol. She is unable to localize her pain at this time. She has associated nausea, fatigue, aches, headache, body aches, myalgias. She has had 1-2 episodes of vomiting over the past 3 days. She denies any changes in her stools, melena, hematuria. He is here, or diarrhea. She has a history of 2 C-sections, but no other abdominal surgeries. She denies urinary symptoms or vaginal symptoms.  Past Medical History  Diagnosis Date  . Allergy   . Anxiety   . Hyperlipidemia   . ADD (attention deficit disorder)    Past Surgical History  Procedure Laterality Date  . Eye surgery    . Cesarean section     Family History  Problem Relation Age of Onset  . Heart disease Mother     CAD/stenting  . Hyperlipidemia Mother   . Arthritis Mother   . Hypertension Mother   . Hyperlipidemia Father   . Hyperlipidemia Sister    Social History  Substance Use Topics  . Smoking status: Former Research scientist (life sciences)  . Smokeless tobacco: Never Used  . Alcohol Use: 1.2 oz/week    2 Cans  of beer per week     Comment: 2 beers daily   OB History    No data available     Review of Systems  Ten systems reviewed and are negative for acute change, except as noted in the HPI.    Allergies  Avelox; Cefaclor; and Latex  Home Medications   Prior to Admission medications   Medication Sig Start Date End Date Taking? Authorizing Provider  acetaminophen (TYLENOL) 500 MG tablet Take 1,000 mg by mouth every 6 (six) hours as needed for mild pain.   Yes Historical Provider, MD  amphetamine-dextroamphetamine (ADDERALL) 10 MG tablet Take 10 mg once a day 03/08/16  Yes Darlyne Russian, MD  diphenhydrAMINE (BENADRYL) 25 MG tablet Take 25 mg by mouth at bedtime.   Yes Historical Provider, MD  HYDROcodone-acetaminophen (NORCO/VICODIN) 5-325 MG tablet Take 1 tablet by mouth every 4 (four) hours as needed for severe pain. 03/09/16  Yes Blanchie Dessert, MD  loratadine (CLARITIN) 10 MG tablet Take 10 mg by mouth daily.   Yes Historical Provider, MD  Multiple Minerals-Vitamins (CITRACAL PLUS PO) Take 1 packet by mouth daily. Reported on 03/08/2016   Yes Historical Provider, MD  Multiple Vitamins-Minerals (WOMENS MULTI VITAMIN & MINERAL) TABS Take 1 tablet by mouth daily.    Yes Historical Provider, MD  Omega-3 Fatty Acids (SALMON OIL PO) Take 1 capsule by mouth daily.    Yes Historical Provider, MD  omeprazole (PRILOSEC) 40 MG capsule  Take 1 capsule (40 mg total) by mouth daily. 03/08/16  Yes Darlyne Russian, MD  Probiotic Product (PROBIOTIC COMPLEX ACIDOPHILUS PO) Take 1 capsule by mouth daily.    Yes Historical Provider, MD  sodium chloride (OCEAN) 0.65 % nasal spray Place 1 spray into the nose daily as needed for congestion.    Yes Historical Provider, MD  albuterol (PROVENTIL HFA;VENTOLIN HFA) 108 (90 BASE) MCG/ACT inhaler Inhale 2 puffs into the lungs every 6 (six) hours as needed for wheezing or shortness of breath. Patient not taking: Reported on 03/08/2016 08/14/15   Darreld Mclean, MD   amoxicillin-clavulanate (AUGMENTIN) 875-125 MG tablet Take 1 tablet by mouth 2 (two) times daily. One po bid x 7 days 03/11/16   Margarita Mail, PA-C  amphetamine-dextroamphetamine (ADDERALL) 10 MG tablet Take 10 mg once a day. May fill 30 days after rx 03/08/16   Darlyne Russian, MD  amphetamine-dextroamphetamine (ADDERALL) 10 MG tablet Take 10 mg once a day.  Ok to fill 60 days after rx 03/08/16   Darlyne Russian, MD  Boric Acid POWD Place 600 mg vaginally at bedtime. Insert in the vagina as directed.  Make up as boric acid vaginal suppository.  qhs x 1 week then twice weekly Patient not taking: Reported on 03/08/2016 09/08/15   Robyn Haber, MD  dicyclomine (BENTYL) 10 MG capsule 10-20 mg up to 3 times daily for abdominal cramping. 03/11/16   Margarita Mail, PA-C  metoCLOPramide (REGLAN) 5 MG tablet Take 1 tablet (5 mg total) by mouth 4 (four) times daily as needed for nausea or vomiting. 03/11/16   Margarita Mail, PA-C  oxyCODONE (OXY IR/ROXICODONE) 5 MG immediate release tablet Take 0.5-1 tablets (2.5-5 mg total) by mouth every 4 (four) hours as needed for severe pain. 03/11/16   Dolora Ridgely, PA-C   BP 107/71 mmHg  Pulse 75  Temp(Src) 100.5 F (38.1 C) (Oral)  Resp 16  Ht 5' 6"  (1.676 m)  Wt 67.586 kg  BMI 24.06 kg/m2  SpO2 97%  LMP 03/01/2016 Physical Exam Physical Exam  Nursing note and vitals reviewed. Constitutional: She is oriented to person, place, and time. She appears well-developed and well-nourished. No distress.  HENT:  Head: Normocephalic and atraumatic.  Eyes: Conjunctivae normal and EOM are normal. Pupils are equal, round, and reactive to light. No scleral icterus.  Neck: Normal range of motion.  Cardiovascular: Normal rate, regular rhythm and normal heart sounds.  Exam reveals no gallop and no friction rub.   No murmur heard. Pulmonary/Chest: Effort normal and breath sounds normal. No respiratory distress.  Abdominal: Soft. Hyperactive bowel sounds, diffuse tenderness,  worse in the right lower quadrant with some rebound and positive Rovsing's sign. No CVA tenderness  Neurological: She is alert and oriented to person, place, and time.  Skin: Skin is warm and dry. She is not diaphoretic.    ED Course  Procedures (including critical care time) Labs Review Labs Reviewed  CBC WITH DIFFERENTIAL/PLATELET - Abnormal; Notable for the following:    WBC 14.1 (*)    Neutro Abs 11.4 (*)    Monocytes Absolute 1.9 (*)    All other components within normal limits  COMPREHENSIVE METABOLIC PANEL - Abnormal; Notable for the following:    Potassium 3.4 (*)    Calcium 8.6 (*)    All other components within normal limits  URINALYSIS, ROUTINE W REFLEX MICROSCOPIC (NOT AT [facility]) - Abnormal; Notable for the following:    Hgb urine dipstick TRACE (*)  Ketones, ur 15 (*)    All other components within normal limits  URINE MICROSCOPIC-ADD ON - Abnormal; Notable for the following:    Squamous Epithelial / LPF 0-5 (*)    Bacteria, UA RARE (*)    All other components within normal limits  LIPASE, BLOOD  I-STAT BETA HCG BLOOD, ED ([facility], [facility], [facility] ONLY)  I-STAT BETA HCG BLOOD, ED ([facility], [facility], [facility] ONLY)    Imaging Review No results found. I have personally reviewed and evaluated these images and lab results as part of my medical decision-making.   EKG Interpretation None      MDM   Final diagnoses:  Ileitis, terminal, without complications [facility])    Patient returns emergency Department with worsening abdominal symptoms and fever, her white count has nearly doubled in the past 3 days.  Repeat CT exam shows increased inflammation at the terminal ilium. I have spoken with Dr. Audie Pinto who asks for a GI consult.   11:45 AM I have discussed the case with Dr. Michail Sermon. As the patient's main concern is her continued feve, and her pain is otherwise tolerable and  She is able to tolerated POs, He feels that she can be managed as an outpatient . He recommends ABX and follow up with pcp  in [redacted] week along with a GI Follow up later this month.      After consult with pharmacy because of her cephalosporin allergy, we will treat with Unasyn IV ans DC weith augmentin as she is able to tolerate amoxil.  The patientt is advised on clear liquid diet and diet advancement, reasons to seek immediate medical care, and follow up instructions. She appears safe for D/c at thistime.       Margarita Mail, PA-C 03/16/16 5035  Leonard Schwartz, MD 04/14/16 [phone]

## 2016-03-11 NOTE — Discharge Instructions (Signed)
Please follow up with at the ED for reasons we discussed. Follow up with your Dr. Everlene Farrier next week. Make an appointment with Dr. Michail Sermon. Start with a clear liquid diet and then progress with bland diet.  Colitis Colitis is inflammation of the colon. Colitis may last a short time (acute) or it may last a long time (chronic). CAUSES This condition may be caused by:  Viruses.  Bacteria.  Reactions to medicine.  Certain autoimmune diseases, such as Crohn disease or ulcerative colitis. SYMPTOMS Symptoms of this condition include:  Diarrhea.  Passing bloody or tarry stool.  Pain.  Fever.  Vomiting.  Tiredness (fatigue).  Weight loss.  Bloating.  Sudden increase in abdominal pain.  Having fewer bowel movements than usual. DIAGNOSIS This condition is diagnosed with a stool test or a blood test. You may also have other tests, including X-rays, a CT scan, or a colonoscopy. TREATMENT Treatment may include:  Resting the bowel. This involves not eating or drinking for a period of time.  Fluids that are given through an IV tube.  Medicine for pain and diarrhea.  Antibiotic medicines.  Cortisone medicines.  Surgery. HOME CARE INSTRUCTIONS Eating and Drinking  Follow instructions from your health care provider about eating or drinking restrictions.  Drink enough fluid to keep your urine clear or pale yellow.  Work with a dietitian to determine which foods cause your condition to flare up.  Avoid foods that cause flare-ups.  Eat a well-balanced diet. Medicines  Take over-the-counter and prescription medicines only as told by your health care provider.  If you were prescribed an antibiotic medicine, take it as told by your health care provider. Do not stop taking the antibiotic even if you start to feel better. General Instructions  Keep all follow-up visits as told by your health care provider. This is important. SEEK MEDICAL CARE IF:  Your symptoms do not  go away.  You develop new symptoms. SEEK IMMEDIATE MEDICAL CARE IF:  You have a fever that does not go away with treatment.  You develop chills.  You have extreme weakness, fainting, or dehydration.  You have repeated vomiting.  You develop severe pain in your abdomen.  You pass bloody or tarry stool.   This information is not intended to replace advice given to you by your health care provider. Make sure you discuss any questions you have with your health care provider.   Document Released: 10/06/2004 Document Revised: 05/20/2015 Document Reviewed: 12/22/2014 Elsevier Interactive Patient Education 2016 Elsevier Inc.     Clear Liquid Diet A clear liquid diet is a short-term diet that is prescribed to provide the necessary fluid and basic energy you need when you can have nothing else. The clear liquid diet consists of liquids or solids that will become liquid at room temperature. You should be able to see through the liquid. There are many reasons that you may be restricted to clear liquids, such as:  When you have a sudden-onset (acute) condition that occurs before or after surgery.  To help your body slowly get adjusted to food again after a long period when you were unable to have food.  Replacement of fluids when you have a diarrheal disease.  When you are going to have certain exams, such as a colonoscopy, in which instruments are inserted inside your body to look at parts of your digestive system. WHAT CAN I HAVE? A clear liquid diet does not provide all the nutrients you need. It is important to choose a  variety of the following items to get as many nutrients as possible:  Vegetable juices that do not have pulp.  Fruit juices and fruit drinks that do not have pulp.  Coffee (regular or decaffeinated), tea, or soda at the discretion of your health care provider.  Clear bouillon, broth, or strained broth-based soups.  High-protein and flavored gelatins.  Sugar or  honey.  Ices or frozen ice pops that do not contain milk. If you are not sure whether you can have certain items, you should ask your health care provider. You may also ask your health care provider if there are any other clear liquid options.   This information is not intended to replace advice given to you by your health care provider. Make sure you discuss any questions you have with your health care provider.   Document Released: 08/29/2005 Document Revised: 09/03/2013 Document Reviewed: 07/26/2013 Elsevier Interactive Patient Education [employer].

## 2016-03-11 NOTE — Telephone Encounter (Signed)
Fever greater then 103. Unable to eat. Vomiting. Recent abnormal CT with ileitis and nodes. Advised  To return to ER for evaluation.

## 2016-03-12 ENCOUNTER — Telehealth: Payer: Self-pay | Admitting: Emergency Medicine

## 2016-03-12 NOTE — Telephone Encounter (Signed)
Patient has been back to the emergency room. They diagnosed her with an ileitis and plan to treat her with antibiotics. Plan is to follow-up with GI specialist.

## 2016-03-14 ENCOUNTER — Ambulatory Visit (INDEPENDENT_AMBULATORY_CARE_PROVIDER_SITE_OTHER): Payer: PRIVATE HEALTH INSURANCE | Admitting: Emergency Medicine

## 2016-03-14 ENCOUNTER — Telehealth: Payer: Self-pay

## 2016-03-14 ENCOUNTER — Encounter: Payer: Self-pay | Admitting: Gastroenterology

## 2016-03-14 VITALS — BP 112/60 | HR 79 | Temp 98.5°F | Resp 16 | Ht 66.0 in | Wt 154.0 lb

## 2016-03-14 DIAGNOSIS — R1010 Upper abdominal pain, unspecified: Secondary | ICD-10-CM

## 2016-03-14 DIAGNOSIS — K529 Noninfective gastroenteritis and colitis, unspecified: Secondary | ICD-10-CM | POA: Diagnosis not present

## 2016-03-14 DIAGNOSIS — H6092 Unspecified otitis externa, left ear: Secondary | ICD-10-CM

## 2016-03-14 LAB — POCT CBC
Granulocyte percent: 75.9 %G (ref 37–80)
HCT, POC: 32 % — AB (ref 37.7–47.9)
Hemoglobin: 11.3 g/dL — AB (ref 12.2–16.2)
Lymph, poc: 1.3 (ref 0.6–3.4)
MCH, POC: 31.7 pg — AB (ref 27–31.2)
MCHC: 35.4 g/dL (ref 31.8–35.4)
MCV: 89.3 fL (ref 80–97)
MID (cbc): 0.8 (ref 0–0.9)
MPV: 7.4 fL (ref 0–99.8)
POC Granulocyte: 6.6 (ref 2–6.9)
POC LYMPH PERCENT: 14.9 %L (ref 10–50)
POC MID %: 14.9 %M — AB (ref 0–12)
Platelet Count, POC: 269 10*3/uL (ref 142–424)
RBC: 3.58 M/uL — AB (ref 4.04–5.48)
RDW, POC: 12.5 %
WBC: 8.7 10*3/uL (ref 4.6–10.2)

## 2016-03-14 NOTE — Progress Notes (Signed)
By signing my name below, I, Raven Small, attest that this documentation has been prepared under the direction and in the presence of Arlyss Queen, MD.  Electronically Signed: Thea Alken, ED Scribe. 03/14/2016. 1:40 PM.   Chief Complaint:  Chief Complaint  Patient presents with  . Follow-up    hospital visit/ stomach pain/ pt states she needs a referral  . Ear Pain    left ear    HPI: Robin [name] is a 45 y.o. female who reports to [facility] today for a follow up of abdominal pain. I saw pt 6/27 for upper abdominal pain. WBC was 8.8. She had a CT which revealed distention of ilium but was not put on abx. She called 3 days later with worsening symptoms of increased fever of 103, vomiting and the inability to eat so was told to been seen at the ED. She had a second CT which revealed a worsening exam compared to prior and CBC at ED. WBC of 14.1. She was diagnosed with ileitis and was started on Augmentin, Reglan, oxycodone, bentyl.  Pt states sense being seen in the ED symptoms have improved each day. She was initially had break through fevers but this has improved. She has new diarrhea, mainly in the morning. Initially she was on a liquid diet but started eating bland foods yesterday. She has achy pain in lower abdomen but persistent pressure pain in upper abdomen. She finds relied with oxycodone. Pt reports hx of abdominal pain secondary to constipation. Pt denies emesis.   Pt also complains of left ear pain. She feels like this is swimmers ear. States pain has improved since cleaning ear.  Past Medical History  Diagnosis Date  . Allergy   . Anxiety   . Hyperlipidemia   . ADD (attention deficit disorder)    Past Surgical History  Procedure Laterality Date  . Eye surgery    . Cesarean section     Social History   Social History  . Marital Status: Married    Spouse Name: N/A  . Number of Children: N/A  . Years of Education: N/A   Social History Main Topics  . Smoking status:  Former Research scientist (life sciences)  . Smokeless tobacco: Never Used  . Alcohol Use: 1.2 oz/week    2 Cans of beer per week     Comment: 2 beers daily  . Drug Use: No  . Sexual Activity: Yes   Other Topics Concern  . Not on file   Social History Narrative   Marital status: married      Children: 2 children (14, 81)      Employment:    Family History  Problem Relation Age of Onset  . Heart disease Mother     CAD/stenting  . Hyperlipidemia Mother   . Arthritis Mother   . Hypertension Mother   . Hyperlipidemia Father   . Hyperlipidemia Sister    Allergies  Allergen Reactions  . Avelox [Moxifloxacin Hcl In Nacl] Other (See Comments)    Muscle cramps  . Cefaclor Hives    Tolerates penicillins  . Latex Other (See Comments)    "Raw skin"   Prior to Admission medications   Medication Sig Start Date End Date Taking? Authorizing Provider  acetaminophen (TYLENOL) 500 MG tablet Take 1,000 mg by mouth every 6 (six) hours as needed for mild pain.    Historical Provider, MD  albuterol (PROVENTIL HFA;VENTOLIN HFA) 108 (90 BASE) MCG/ACT inhaler Inhale 2 puffs into the lungs every 6 (six)  hours as needed for wheezing or shortness of breath. Patient not taking: Reported on 03/08/2016 08/14/15   Darreld Mclean, MD  amoxicillin-clavulanate (AUGMENTIN) 875-125 MG tablet Take 1 tablet by mouth 2 (two) times daily. One po bid x 7 days 03/11/16   Margarita Mail, PA-C  amphetamine-dextroamphetamine (ADDERALL) 10 MG tablet Take 10 mg once a day. May fill 30 days after rx Patient not taking: Reported on 03/08/2016 03/08/16   Darlyne Russian, MD  amphetamine-dextroamphetamine (ADDERALL) 10 MG tablet Take 10 mg once a day.  Ok to fill 60 days after rx Patient not taking: Reported on 03/08/2016 03/08/16   Darlyne Russian, MD  amphetamine-dextroamphetamine (ADDERALL) 10 MG tablet Take 10 mg once a day 03/08/16   Darlyne Russian, MD  Boric Acid POWD Place 600 mg vaginally at bedtime. Insert in the vagina as directed.  Make up as boric  acid vaginal suppository.  qhs x 1 week then twice weekly Patient not taking: Reported on 03/08/2016 09/08/15   Robyn Haber, MD  dicyclomine (BENTYL) 10 MG capsule 10-20 mg up to 3 times daily for abdominal cramping. 03/11/16   Margarita Mail, PA-C  diphenhydrAMINE (BENADRYL) 25 MG tablet Take 25 mg by mouth at bedtime.    Historical Provider, MD  HYDROcodone-acetaminophen (NORCO/VICODIN) 5-325 MG tablet Take 1 tablet by mouth every 4 (four) hours as needed for severe pain. 03/09/16   Blanchie Dessert, MD  loratadine (CLARITIN) 10 MG tablet Take 10 mg by mouth daily.    Historical Provider, MD  metoCLOPramide (REGLAN) 5 MG tablet Take 1 tablet (5 mg total) by mouth 4 (four) times daily as needed for nausea or vomiting. 03/11/16   Margarita Mail, PA-C  Multiple Minerals-Vitamins (CITRACAL PLUS PO) Take 1 packet by mouth daily. Reported on 03/08/2016    Historical Provider, MD  Multiple Vitamins-Minerals (WOMENS MULTI VITAMIN & MINERAL) TABS Take 1 tablet by mouth daily.     Historical Provider, MD  Omega-3 Fatty Acids (SALMON OIL PO) Take 1 capsule by mouth daily.     Historical Provider, MD  omeprazole (PRILOSEC) 40 MG capsule Take 1 capsule (40 mg total) by mouth daily. 03/08/16   Darlyne Russian, MD  oxyCODONE (OXY IR/ROXICODONE) 5 MG immediate release tablet Take 0.5-1 tablets (2.5-5 mg total) by mouth every 4 (four) hours as needed for severe pain. 03/11/16   Margarita Mail, PA-C  Probiotic Product (PROBIOTIC COMPLEX ACIDOPHILUS PO) Take 1 capsule by mouth daily.     Historical Provider, MD  sodium chloride (OCEAN) 0.65 % nasal spray Place 1 spray into the nose daily as needed for congestion.     Historical Provider, MD     ROS: The patient denies fevers, chills, night sweats, unintentional weight loss, chest pain, palpitations, wheezing, dyspnea on exertion, nausea, vomiting, abdominal pain, dysuria, hematuria, melena, numbness, weakness, or tingling.   All other systems have been reviewed and  were otherwise negative with the exception of those mentioned in the HPI and as above.    PHYSICAL EXAM: There were no vitals filed for this visit. There is no weight on file to calculate BMI.   General: Alert, no acute distress HEENT:  Normocephalic, atraumatic, oropharynx patent. Eye: [name] [facility] Cardiovascular:  Regular rate and rhythm, no rubs murmurs or gallops.  No Carotid bruits, radial pulse intact. No pedal edema.  Respiratory: Clear to auscultation bilaterally.  No wheezes, rales, or rhonchi.  No cyanosis, no use of accessory musculature Abdominal: No organomegaly, there is diffuse tenderness in  the abdomen but no evidence of distention. No rebound noted. Musculoskeletal: Gait intact. No edema, tenderness Skin: No rashes. Neurologic: Facial musculature symmetric. Psychiatric: Patient acts appropriately throughout our interaction. Lymphatic: No cervical or submandibular lymphadenopathy    LABS: Results for orders placed or performed in visit on 03/14/16  POCT CBC  Result Value Ref Range   WBC 8.7 4.6 - 10.2 K/uL   Lymph, poc 1.3 0.6 - 3.4   POC LYMPH PERCENT 14.9 10 - 50 %L   MID (cbc) 0.8 0 - 0.9   POC MID % 14.9 (A) 0 - 12 %M   POC Granulocyte 6.6 2 - 6.9   Granulocyte percent 75.9 37 - 80 %G   RBC 3.58 (A) 4.04 - 5.48 M/uL   Hemoglobin 11.3 (A) 12.2 - 16.2 g/dL   HCT, POC 32.0 (A) 37.7 - 47.9 %   MCV 89.3 80 - 97 fL   MCH, POC 31.7 (A) 27 - 31.2 pg   MCHC 35.4 31.8 - 35.4 g/dL   RDW, POC 12.5 %   Platelet Count, POC 269 142 - 424 K/uL   MPV 7.4 0 - 99.8 fL     EKG/XRAY:   Primary read interpreted by Dr. Everlene Farrier at [facility].   ASSESSMENT/PLAN: Patient has ileitis seems improved with antibiotics. Referral made to Dr. Michail Sermon. Sedimentation rate was done as well as a GI pathogen panel. No change in treatment at the present time.I personally performed the services described in this documentation, which was scribed in my presence. The recorded information has been  reviewed and is accurate.I am concerned about the drop in hemoglobin which recheck this on Friday.   Gross sideeffects, risk and benefits, and alternatives of medications d/w patient. Patient is aware that all medications have potential sideeffects and we are unable to predict every sideeffect or drug-drug interaction that may occur.  Arlyss Queen MD 03/14/2016 1:40 PM

## 2016-03-14 NOTE — Telephone Encounter (Signed)
Patient has a referral to a GI specialist and wants the referral sent to [facility]. Her appointment is Friday at 8:30. Patient also stated she will be getting her blood work done there.

## 2016-03-14 NOTE — Patient Instructions (Signed)
     IF you received an x-ray today, you will receive an invoice from [facility]. Please contact [facility] at [phone] with questions or concerns regarding your invoice.   IF you received labwork today, you will receive an invoice from [facility]. Please contact [facility] at [phone] with questions or concerns regarding your invoice.   Our billing staff will not be able to assist you with questions regarding bills from these companies.  You will be contacted with the lab results as soon as they are available. The fastest way to get your results is to activate your My Chart account. Instructions are located on the last page of this paperwork. If you have not heard from us regarding the results in 2 weeks, please contact this office.      

## 2016-03-15 LAB — SEDIMENTATION RATE: Sed Rate: 36 mm/hr — ABNORMAL HIGH (ref 0–20)

## 2016-03-16 ENCOUNTER — Other Ambulatory Visit: Payer: Self-pay

## 2016-03-16 DIAGNOSIS — K529 Noninfective gastroenteritis and colitis, unspecified: Secondary | ICD-10-CM

## 2016-03-18 ENCOUNTER — Ambulatory Visit (INDEPENDENT_AMBULATORY_CARE_PROVIDER_SITE_OTHER): Payer: PRIVATE HEALTH INSURANCE | Admitting: Gastroenterology

## 2016-03-18 ENCOUNTER — Encounter: Payer: Self-pay | Admitting: Gastroenterology

## 2016-03-18 ENCOUNTER — Other Ambulatory Visit (INDEPENDENT_AMBULATORY_CARE_PROVIDER_SITE_OTHER): Payer: PRIVATE HEALTH INSURANCE

## 2016-03-18 VITALS — BP 102/60 | HR 84 | Ht 66.0 in | Wt 148.0 lb

## 2016-03-18 DIAGNOSIS — K5000 Crohn's disease of small intestine without complications: Secondary | ICD-10-CM | POA: Diagnosis not present

## 2016-03-18 DIAGNOSIS — R1031 Right lower quadrant pain: Secondary | ICD-10-CM

## 2016-03-18 LAB — CBC WITH DIFFERENTIAL/PLATELET
Basophils Absolute: 0 10*3/uL (ref 0.0–0.1)
Basophils Relative: 0.2 % (ref 0.0–3.0)
Eosinophils Absolute: 0.2 10*3/uL (ref 0.0–0.7)
Eosinophils Relative: 2.2 % (ref 0.0–5.0)
HCT: 38.1 % (ref 36.0–46.0)
Hemoglobin: 12.9 g/dL (ref 12.0–15.0)
Lymphocytes Relative: 17.4 % (ref 12.0–46.0)
Lymphs Abs: 1.6 10*3/uL (ref 0.7–4.0)
MCHC: 33.8 g/dL (ref 30.0–36.0)
MCV: 90.5 fl (ref 78.0–100.0)
Monocytes Absolute: 0.8 10*3/uL (ref 0.1–1.0)
Monocytes Relative: 8.5 % (ref 3.0–12.0)
Neutro Abs: 6.7 10*3/uL (ref 1.4–7.7)
Neutrophils Relative %: 71.7 % (ref 43.0–77.0)
Platelets: 426 10*3/uL — ABNORMAL HIGH (ref 150.0–400.0)
RBC: 4.21 Mil/uL (ref 3.87–5.11)
RDW: 13.4 % (ref 11.5–15.5)
WBC: 9.4 10*3/uL (ref 4.0–10.5)

## 2016-03-18 MED ORDER — NA SULFATE-K SULFATE-MG SULF 17.5-3.13-1.6 GM/177ML PO SOLN: 1.0000 | Freq: Once | ORAL | Status: DC

## 2016-03-18 NOTE — Patient Instructions (Signed)

## 2016-03-18 NOTE — Progress Notes (Signed)
03/18/2016 Robin [name] [id number] [birth year]   HISTORY OF PRESENT ILLNESS:  This is a 45 year old female who is new to our office and was referred here by her PCP, Dr. Everlene Farrier, for evaluation regarding recent right lower quadrant abdominal pain and ileitis as seen on CT scan. The patient tells me that overall she has issues with constipation and she can recall having some chronic low-grade right lower quadrant abdominal discomfort for quite some time, but nothing that had triggered her to be evaluated. Then, on or around June 27 she developed severe abdominal pain and went to the ER where she was found to have abnormal terminal ileum, question terminal ileitis with prominent right lower quadrant mesenteric nodes on CT scan abdomen and pelvis with contrast. They treated her in the ER, told her is likely viral, discharge her home. Due to worsening symptoms she presented back to the ER a couple of days later where CT scan was repeated.  This showed increase in size of the mesenteric lymph nodes, mild wall thickening involving the ileocecal valve and most distal aspect of the terminal ileum, suggesting ileitis. She is placed on Augmentin 875 mg twice a day for 7 days, which she has now completed. She is feeling much better at this point but does still have some mild discomfort. She has been drinking a lot of fluids and eating bland foods. She denies any diarrhea or rectal bleeding.  Past Medical History  Diagnosis Date  . Allergy   . Anxiety   . Hyperlipidemia   . ADD (attention deficit disorder)    Past Surgical History  Procedure Laterality Date  . Eye surgery    . Cesarean section      reports that she has quit smoking. She has never used smokeless tobacco. She reports that she drinks about 1.2 oz of alcohol per week. She reports that she does not use illicit drugs. family history includes Arthritis in her mother; Heart disease in her mother; Hyperlipidemia in her father, mother, and  sister; Hypertension in her mother. Allergies  Allergen Reactions  . Avelox [Moxifloxacin Hcl In Nacl] Other (See Comments)    Muscle cramps  . Cefaclor Hives    Tolerates penicillins  . Latex Other (See Comments)    "Raw skin"      Outpatient Encounter Prescriptions as of 03/18/2016  Medication Sig  . acetaminophen (TYLENOL) 500 MG tablet Take 1,000 mg by mouth every 6 (six) hours as needed for mild pain.  Marland Kitchen albuterol (PROVENTIL HFA;VENTOLIN HFA) 108 (90 BASE) MCG/ACT inhaler Inhale 2 puffs into the lungs every 6 (six) hours as needed for wheezing or shortness of breath.  . amphetamine-dextroamphetamine (ADDERALL) 10 MG tablet Take 10 mg once a day. May fill 30 days after rx  . Boric Acid POWD Place 600 mg vaginally at bedtime. Insert in the vagina as directed.  Make up as boric acid vaginal suppository.  qhs x 1 week then twice weekly  . dicyclomine (BENTYL) 10 MG capsule 10-20 mg up to 3 times daily for abdominal cramping.  . diphenhydrAMINE (BENADRYL) 25 MG tablet Take 25 mg by mouth at bedtime.  Marland Kitchen loratadine (CLARITIN) 10 MG tablet Take 10 mg by mouth daily.  . metoCLOPramide (REGLAN) 5 MG tablet Take 1 tablet (5 mg total) by mouth 4 (four) times daily as needed for nausea or vomiting.  . Multiple Minerals-Vitamins (CITRACAL PLUS PO) Take 1 packet by mouth daily. Reported on 03/08/2016  . Omega-3 Fatty Acids (SALMON OIL  PO) Take 1 capsule by mouth daily.   Marland Kitchen omeprazole (PRILOSEC) 40 MG capsule Take 1 capsule (40 mg total) by mouth daily.  . Probiotic Product (PROBIOTIC COMPLEX ACIDOPHILUS PO) Take 1 capsule by mouth daily.   . sodium chloride (OCEAN) 0.65 % nasal spray Place 1 spray into the nose daily as needed for congestion.   . Multiple Vitamins-Minerals (WOMENS MULTI VITAMIN & MINERAL) TABS Take 1 tablet by mouth daily.   . Na Sulfate-K Sulfate-Mg Sulf 17.5-3.13-1.6 GM/180ML SOLN Take 1 kit by mouth once.  . [DISCONTINUED] amoxicillin-clavulanate (AUGMENTIN) 875-125 MG tablet Take  1 tablet by mouth 2 (two) times daily. One po bid x 7 days (Patient not taking: Reported on 03/18/2016)  . [DISCONTINUED] amphetamine-dextroamphetamine (ADDERALL) 10 MG tablet Take 10 mg once a day.  Ok to fill 60 days after rx (Patient not taking: Reported on 03/18/2016)  . [DISCONTINUED] amphetamine-dextroamphetamine (ADDERALL) 10 MG tablet Take 10 mg once a day (Patient not taking: Reported on 03/18/2016)  . [DISCONTINUED] HYDROcodone-acetaminophen (NORCO/VICODIN) 5-325 MG tablet Take 1 tablet by mouth every 4 (four) hours as needed for severe pain. (Patient not taking: Reported on 03/18/2016)  . [DISCONTINUED] oxyCODONE (OXY IR/ROXICODONE) 5 MG immediate release tablet Take 0.5-1 tablets (2.5-5 mg total) by mouth every 4 (four) hours as needed for severe pain. (Patient not taking: Reported on 03/18/2016)   No facility-administered encounter medications on file as of 03/18/2016.     REVIEW OF SYSTEMS  : All other systems reviewed and negative except where noted in the History of Present Illness.   PHYSICAL EXAM: BP 102/60 mmHg  Pulse 84  Ht _0  (1.676 m)  Wt 148 lb (67.132 kg)  BMI 23.90 kg/m2  SpO2 98%  LMP 03/01/2016 General: Well developed white female in no acute distress Head: Normocephalic and atraumatic Eyes:  Sclerae anicteric, conjunctiva pink. Ears: Normal auditory acuity Lungs: Clear throughout to auscultation Heart: Regular rate and rhythm Abdomen: Soft, non-distended.  Normal bowel sounds.  Mild RLQ TTP. Rectal:  Will be done at the time of colonoscopy. Musculoskeletal: Symmetrical with no gross deformities  Skin: No lesions on visible extremities Extremities: No edema  Neurological: Alert oriented x 4, grossly non-focal Psychological:  Alert and cooperative. Normal mood and affect  ASSESSMENT AND PLAN: -RLQ abdominal pain and terminal ileitis on CT scan:  ? Infectious or IBD/Crohn's.  Acute symptoms worsened rather suddenly and she has certainly improved on antibiotics,  however, does report some chronic low-grade abdominal pain.  Will schedule colonoscopy/ileoscopy with Dr. Ardis Hughs in a couple of weeks to evaluate, biopsy to rule out IBD.  Low fiber/low residue diet for now.  CC:  Wardell Honour, MD

## 2016-03-21 ENCOUNTER — Telehealth: Payer: Self-pay | Admitting: Gastroenterology

## 2016-03-21 NOTE — Telephone Encounter (Signed)
Called the patient and advised we are waiting for samples. I advised I couldn't promise a sample but we may get them in time for her procedure.  She said her prep co-pay was $80.00.  I asked her to call me next week, the week of 03-28-2016.

## 2016-03-21 NOTE — Progress Notes (Signed)
i agree with the above note, plan 

## 2016-03-22 LAB — OTHER SOLSTAS TEST: COST - Referred Assay: 65008

## 2016-03-25 ENCOUNTER — Telehealth: Payer: Self-pay

## 2016-03-25 NOTE — Telephone Encounter (Signed)
-----   Message from Darlyne Russian, MD sent at 03/15/2016  8:43 AM EDT ----- Sedimentation rate is elevated consistent with the inflammation of her ileum.

## 2016-03-25 NOTE — Telephone Encounter (Signed)
Spoke with pt - gave message per Dr. Everlene Farrier.  She states she has already seen the GI and will have endoscopy in a month. Appreciated the call.

## 2016-03-28 ENCOUNTER — Encounter: Payer: Self-pay | Admitting: Gastroenterology

## 2016-03-28 ENCOUNTER — Telehealth: Payer: Self-pay | Admitting: Gastroenterology

## 2016-03-28 NOTE — Telephone Encounter (Signed)
Pam have you spoken to her about samples?  Is Magda Paganini working on getting her Suprep?

## 2016-03-28 NOTE — Telephone Encounter (Signed)
Advised the patient thatwe will call her when we have the prep for her. More than likly the end of the week or beginning of next week.  Also, I asked about her abdominal pain. She said it isn't too bad but does have it at this time daily. I advised her to take the Bentyl ( dicyclomine) 10 mg twice daily for now since she is having episodes right now. She thanked me for calling her back and said she will take the Bentyl as suggested.

## 2016-04-08 ENCOUNTER — Ambulatory Visit (AMBULATORY_SURGERY_CENTER): Payer: PRIVATE HEALTH INSURANCE | Admitting: Gastroenterology

## 2016-04-08 ENCOUNTER — Encounter: Payer: Self-pay | Admitting: Gastroenterology

## 2016-04-08 VITALS — BP 115/72 | HR 70 | Temp 97.3°F | Resp 11 | Ht 66.0 in | Wt 148.0 lb

## 2016-04-08 DIAGNOSIS — K5000 Crohn's disease of small intestine without complications: Secondary | ICD-10-CM | POA: Diagnosis present

## 2016-04-08 DIAGNOSIS — K6389 Other specified diseases of intestine: Secondary | ICD-10-CM | POA: Diagnosis not present

## 2016-04-08 MED ORDER — SODIUM CHLORIDE 0.9 % IV SOLN: 500.0000 mL | INTRAVENOUS | Status: DC

## 2016-04-08 NOTE — Progress Notes (Signed)
A and O x3. Report to RN. Tolerated MAC anesthesia well. 

## 2016-04-08 NOTE — Op Note (Signed)
[facility] Patient Name: Robin [name] Procedure Date: 04/08/2016 2:05 PM MRN: [id number] Endoscopist: Milus Banister , MD Age: 45 Referring MD:  Date of Birth: 1971-06-20 Gender: Female Account #: [id number] Procedure:                Colonoscopy Indications:              Abdominal pain in the right lower quadrant,                            Abnormal CT of the GI tract (distal ileitis on CT,                            nearby adenopathy) Medicines:                Monitored Anesthesia Care Procedure:                Pre-Anesthesia Assessment:                           - Prior to the procedure, a History and Physical                            was performed, and patient medications and                            allergies were reviewed. The patient's tolerance of                            previous anesthesia was also reviewed. The risks                            and benefits of the procedure and the sedation                            options and risks were discussed with the patient.                            All questions were answered, and informed consent                            was obtained. Prior Anticoagulants: The patient has                            taken no previous anticoagulant or antiplatelet                            agents. ASA Grade Assessment: II - A patient with                            mild systemic disease. After reviewing the risks                            and benefits, the patient was deemed in  satisfactory condition to undergo the procedure.                           After obtaining informed consent, the colonoscope                            was passed under direct vision. Throughout the                            procedure, the patient's blood pressure, pulse, and                            oxygen saturations were monitored continuously. The                            Model PCF-H190DL [id number]) scope was  introduced                            through the anus and advanced to the the terminal                            ileum. The colonoscopy was performed without                            difficulty. The patient tolerated the procedure                            well. The quality of the bowel preparation was                            excellent. The terminal ileum, ileocecal valve,                            appendiceal orifice, and rectum were photographed. Scope In: 2:20:28 PM Scope Out: 2:35:01 PM Scope Withdrawal Time: 0 hours 10 minutes 0 seconds  Total Procedure Duration: 0 hours 14 minutes 33 seconds  Findings:                 A localized area of mucosa in the distal ileum was                            mildly congested, furrowed and granular. Biopsies                            were taken with a cold forceps for histology.                           The exam was otherwise without abnormality on                            direct and retroflexion views. Complications:            No immediate complications. Estimated blood loss:  None. Estimated Blood Loss:     Estimated blood loss: none. Impression:               - Congested, furrowed and granular mucosa in the                            distal ileum. Biopsied.                           - The examination was otherwise normal on direct                            and retroflexion views. Recommendation:           - Patient has a contact number available for                            emergencies. The signs and symptoms of potential                            delayed complications were discussed with the                            patient. Return to normal activities tomorrow.                            Written discharge instructions were provided to the                            patient.                           - Resume previous diet.                           - Continue present medications.                            - Await pathology results. Milus Banister, MD 04/08/2016 2:41:06 PM This report has been signed electronically.

## 2016-04-08 NOTE — Progress Notes (Signed)
Called to room to assist with pathology

## 2016-04-08 NOTE — Patient Instructions (Signed)
YOU HAD AN ENDOSCOPIC PROCEDURE TODAY AT [facility]:   Refer to the procedure report that was given to you for any specific questions about what was found during the examination.  If the procedure report does not answer your questions, please call your gastroenterologist to clarify.  If you requested that your care partner not be given the details of your procedure findings, then the procedure report has been included in a sealed envelope for you to review at your convenience later.  YOU SHOULD EXPECT: Some feelings of bloating in the abdomen. Passage of more gas than usual.  Walking can help get rid of the air that was put into your GI tract during the procedure and reduce the bloating. If you had a lower endoscopy (such as a colonoscopy or flexible sigmoidoscopy) you may notice spotting of blood in your stool or on the toilet paper. If you underwent a bowel prep for your procedure, you may not have a normal bowel movement for a few days.  Please Note:  You might notice some irritation and congestion in your nose or some drainage.  This is from the oxygen used during your procedure.  There is no need for concern and it should clear up in a day or so.  SYMPTOMS TO REPORT IMMEDIATELY:   Following lower endoscopy (colonoscopy or flexible sigmoidoscopy):  Excessive amounts of blood in the stool  Significant tenderness or worsening of abdominal pains  Swelling of the abdomen that is new, acute  Fever of 100F or higher For urgent or emergent issues, a gastroenterologist can be reached at any hour by calling [phone].   DIET: Your first meal following the procedure should be a small meal and then it is ok to progress to your normal diet. Heavy or fried foods are harder to digest and may make you feel nauseous or bloated.  Likewise, meals heavy in dairy and vegetables can increase bloating.  Drink plenty of fluids but you should avoid alcoholic beverages for 24 hours.  ACTIVITY:   You should plan to take it easy for the rest of today and you should NOT DRIVE or use heavy machinery until tomorrow (because of the sedation medicines used during the test).    FOLLOW UP: Our staff will call the number listed on your records the next business day following your procedure to check on you and address any questions or concerns that you may have regarding the information given to you following your procedure. If we do not reach you, we will leave a message.  However, if you are feeling well and you are not experiencing any problems, there is no need to return our call.  We will assume that you have returned to your regular daily activities without incident.  If any biopsies were taken you will be contacted by phone or by letter within the next 1-3 weeks.  Please call us at [phone] if you have not heard about the biopsies in 3 weeks.    SIGNATURES/CONFIDENTIALITY: You and/or your care partner have signed paperwork which will be entered into your electronic medical record.  These signatures attest to the fact that that the information above on your After Visit Summary has been reviewed and is understood.  Full responsibility of the confidentiality of this discharge information lies with you and/or your care-partner.  Await pathology  Continue your normal medications

## 2016-04-11 ENCOUNTER — Telehealth: Payer: Self-pay

## 2016-04-11 NOTE — Telephone Encounter (Signed)
  Follow up Call-  Call back number 04/08/2016  Post procedure Call Back phone  # [phone]  Permission to leave phone message Yes  Some recent data might be hidden     Patient questions:  Do you have a fever, pain , or abdominal swelling? No. Pain Score  0 *  Have you tolerated food without any problems? Yes.    Have you been able to return to your normal activities? Yes.    Do you have any questions about your discharge instructions: Diet   Yes.   Medications  No. Follow up visit  No.  Do you have questions or concerns about your Care? Yes.    Actions: * If pain score is 4 or above: No action needed, pain <4.  Pt was on a low residue diet pre-procedure.  She wants to know if she is to continue this diet or not?  Also pt wants to report that she is having left knee, right ankl and knee and her toes that are swollen and having pain in those areas.  Please advise.

## 2016-04-12 NOTE — Telephone Encounter (Signed)
Patty, Can you check on her this morning.  She is OK to advance to more regular diet.  The biopsies are not back yet from her procedure.

## 2016-04-12 NOTE — Telephone Encounter (Signed)
Left message for pt to call back  °

## 2016-04-13 ENCOUNTER — Telehealth: Payer: Self-pay | Admitting: Gastroenterology

## 2016-04-13 NOTE — Telephone Encounter (Signed)
Dr Ardis Hughs please advise. Incoming call:  Pt said she has been having joint pain, nausea and a temp

## 2016-04-13 NOTE — Telephone Encounter (Signed)
See alternate phone note

## 2016-04-14 ENCOUNTER — Other Ambulatory Visit: Payer: Self-pay

## 2016-04-14 ENCOUNTER — Telehealth: Payer: Self-pay

## 2016-04-14 ENCOUNTER — Encounter: Payer: Self-pay | Admitting: Emergency Medicine

## 2016-04-14 ENCOUNTER — Ambulatory Visit (INDEPENDENT_AMBULATORY_CARE_PROVIDER_SITE_OTHER): Payer: PRIVATE HEALTH INSURANCE | Admitting: Emergency Medicine

## 2016-04-14 VITALS — BP 102/64 | HR 84 | Temp 98.8°F | Resp 16 | Ht 65.5 in | Wt 144.0 lb

## 2016-04-14 DIAGNOSIS — L52 Erythema nodosum: Secondary | ICD-10-CM

## 2016-04-14 DIAGNOSIS — R1031 Right lower quadrant pain: Secondary | ICD-10-CM

## 2016-04-14 LAB — POCT CBC
Granulocyte percent: 75.5 %G (ref 37–80)
HCT, POC: 39.3 % (ref 37.7–47.9)
Hemoglobin: 13.9 g/dL (ref 12.2–16.2)
Lymph, poc: 1.6 (ref 0.6–3.4)
MCH, POC: 31.3 pg — AB (ref 27–31.2)
MCHC: 35.3 g/dL (ref 31.8–35.4)
MCV: 88.8 fL (ref 80–97)
MID (cbc): 0.4 (ref 0–0.9)
MPV: 7.6 fL (ref 0–99.8)
POC Granulocyte: 6.1 (ref 2–6.9)
POC LYMPH PERCENT: 19.5 %L (ref 10–50)
POC MID %: 5 %M (ref 0–12)
Platelet Count, POC: 264 10*3/uL (ref 142–424)
RBC: 4.42 M/uL (ref 4.04–5.48)
RDW, POC: 13.5 %
WBC: 8.1 10*3/uL (ref 4.6–10.2)

## 2016-04-14 LAB — POCT SEDIMENTATION RATE: POCT SED RATE: 54 mm/hr — AB (ref 0–22)

## 2016-04-14 LAB — C-REACTIVE PROTEIN: CRP: 3.7 mg/dL — ABNORMAL HIGH (ref <0.60)

## 2016-04-14 MED ORDER — HYDROCODONE-ACETAMINOPHEN 5-325 MG PO TABS: 1.0000 | ORAL_TABLET | Freq: Four times a day (QID) | ORAL | 0 refills | Status: DC | PRN

## 2016-04-14 MED ORDER — PREDNISONE 20 MG PO TABS: 20.0000 mg | ORAL_TABLET | Freq: Every day | ORAL | 0 refills | Status: DC

## 2016-04-14 NOTE — Telephone Encounter (Signed)
The pt was advised of Dr Lynne Leader recommendations and will follow up with PCP.

## 2016-04-14 NOTE — Telephone Encounter (Signed)
Dr Ardis Hughs, the pt saw Dr Everlene Farrier today and is being treated for Erythema Nodosum.  Dr Everlene Farrier wanted to make you aware and have you review his office note from today.  She did get labs today, we are waiting for results.

## 2016-04-14 NOTE — Addendum Note (Signed)
Addended by: Arlyss Queen A on: 04/14/2016 06:40 PM   Modules accepted: Orders

## 2016-04-14 NOTE — Telephone Encounter (Signed)
She should contact her PCP today for advice/evaluation

## 2016-04-14 NOTE — Telephone Encounter (Signed)
Pt said she was seen today by Dr. Everlene Farrier and he said that he would call her in a rxn for hydrocodone. Pt said she got there and it wasn't there. Told her they are written rxn and it may take a couple of days for that. She can not wait a couple of days.  Please advise  [phone]

## 2016-04-14 NOTE — Patient Instructions (Addendum)
     IF you received an x-ray today, you will receive an invoice from [facility]. Please contact [facility] at [phone] with questions or concerns regarding your invoice.   IF you received labwork today, you will receive an invoice from [employer]. Please contact [facility] at [phone] with questions or concerns regarding your invoice.   Our billing staff will not be able to assist you with questions regarding bills from these companies.  You will be contacted with the lab results as soon as they are available. The fastest way to get your results is to activate your My Chart account. Instructions are located on the last page of this paperwork. If you have not heard from [place] regarding the results in 2 weeks, please contact this office.     Erythema Nodosum Erythema nodosum is a skin condition in which patches of fat under the skin of the lower legs become inflamed. This causes painful bumps (nodules) to form. CAUSES Common causes of this condition include:  Infections.  Certain medicines, especially birth control pills, penicillin, and sulfa medicines. Other causes include:  Pregnancy.  Certain inflammatory conditions, including Lupus, Crohn's disease, and thyroid conditions. In some cases, the cause may not be known. RISK FACTORS This condition is more likely to develop in young adult women. SYMPTOMS The main symptom of this condition is large nodules that look like raised bruises and are tender to the touch. These nodules usually appear on the shins, but they may also appear on the arms or the trunk. They gradually change in color from pink to brown, and they leave a dark mark that clears up in several months. Other symptoms include:  Fever.  Fatigue.  Joint pain.  Itchiness. DIAGNOSIS This condition is diagnosed based on symptoms. To find the underlying condition that caused the erythema nodosum, your health care provider  may also do a physical exam, X-rays, and blood tests. TREATMENT Treatment for this condition depends on the cause. The nodules usually go away with treatment of the underlying condition. Any pain or discomfort may be treated with:  Anti-inflammatory medicines.  Bed rest.  Raising (elevating) the affected area.  Cool compresses. In some cases, steroids and potassium iodide tablets may be given. HOME CARE INSTRUCTIONS  Take medicines only as directed by your health care provider.  Stay in bed for as long as directed by your health care provider.  Until your symptoms go away, limit any exercising that makes you breathe harder and faster (vigorous).  Elevate the affected leg as directed by your health care provider.  Apply cool compresses to the affected area as directed by your health care provider. SEEK MEDICAL CARE IF:  Your symptoms are not improving.  You have a fever that does not go away. SEEK IMMEDIATE MEDICAL CARE IF:  Your condition gets worse.  Your pain gets worse.  You have a sore throat.  You vomit repeatedly.   This information is not intended to replace advice given to you by your health care provider. Make sure you discuss any questions you have with your health care provider.   Document Released: 10/06/2004 Document Revised: 01/13/2015 Document Reviewed: 08/06/2014 Elsevier Interactive Patient Education [employer].

## 2016-04-14 NOTE — Progress Notes (Signed)
Patient ID: Robin [name], female   DOB: 09-17-1970, 45 y.o.   MRN: [id number]    By signing my name below I, Tereasa Coop, attest that this documentation has been prepared under the direction and in the presence of Arlyss Queen, MD. Electonically Signed. Tereasa Coop, Scribe 04/14/2016 at 3:18 PM   Chief Complaint:  Chief Complaint  Patient presents with  . Leg Swelling    getting worse all week, both sides    HPI: Robin [name] is a 45 y.o. female who reports to [facility] today complaining of bilat leg swelling that has been worsening since onset 4 days ago. Swelling is predominantly in knees, ankles, heels, and toes. Pt also reports achy lower extremity joints bilat. Pt has been having a low grade fever. Pain has been worsening to the point where she had difficulty walking today due to pain. Pt has been taking hydrocodone today only and it has enabled her to walk.   Pt was seen and evaluated a month ago at [facility] and dx with ileitis. Pt states her abd feels much better. Pt has had mild nausea within the past few days. Pt started taking her prescribed reglan within the past 3 days.  Pt had a colonoscopy 6 days ago and showed NAD and no sign of crohn's disease.  Past Medical History:  Diagnosis Date  . ADD (attention deficit disorder)   . Allergy   . Anemia   . Anxiety   . Hyperlipidemia    Past Surgical History:  Procedure Laterality Date  . CESAREAN SECTION    . DENTAL SURGERY    . EYE SURGERY     Social History   Social History  . Marital status: Married    Spouse name: N/A  . Number of children: N/A  . Years of education: N/A   Social History Main Topics  . Smoking status: Former Research scientist (life sciences)  . Smokeless tobacco: Never Used  . Alcohol use 1.2 oz/week    2 Cans of beer per week     Comment: 2 beers daily  . Drug use: No  . Sexual activity: Yes   Other Topics Concern  . None   Social History Narrative   Marital status: married      Children: 2 children (14, 51)     Employment:    Family History  Problem Relation Age of Onset  . Heart disease Mother     CAD/stenting  . Hyperlipidemia Mother   . Arthritis Mother   . Hypertension Mother   . Kidney disease Mother   . Hyperlipidemia Father   . Hyperlipidemia Sister    Allergies  Allergen Reactions  . Avelox [Moxifloxacin Hcl In Nacl] Other (See Comments)    Muscle cramps  . Cefaclor Hives    Tolerates penicillins  . Latex Other (See Comments)    "Raw skin"   Prior to Admission medications   Medication Sig Start Date End Date Taking? Authorizing Provider  acetaminophen (TYLENOL) 500 MG tablet Take 1,000 mg by mouth every 6 (six) hours as needed for mild pain.   Yes Historical Provider, MD  albuterol (PROVENTIL HFA;VENTOLIN HFA) 108 (90 BASE) MCG/ACT inhaler Inhale 2 puffs into the lungs every 6 (six) hours as needed for wheezing or shortness of breath. 08/14/15  Yes Gay Filler Copland, MD  amphetamine-dextroamphetamine (ADDERALL) 10 MG tablet Take 10 mg once a day. May fill 30 days after rx 03/08/16  Yes Darlyne Russian, MD  Boric Acid POWD Place 600 mg  vaginally at bedtime. Insert in the vagina as directed.  Make up as boric acid vaginal suppository.  qhs x 1 week then twice weekly 09/08/15  Yes Robyn Haber, MD  Cetirizine HCl (ZYRTEC ALLERGY PO) Take by mouth.   Yes Historical Provider, MD  metoCLOPramide (REGLAN) 5 MG tablet Take 1 tablet (5 mg total) by mouth 4 (four) times daily as needed for nausea or vomiting. 03/11/16  Yes Margarita Mail, PA-C  Multiple Minerals-Vitamins (CITRACAL PLUS PO) Take 1 packet by mouth daily. Reported on 03/08/2016   Yes Historical Provider, MD  Multiple Vitamins-Minerals (WOMENS MULTI VITAMIN & MINERAL) TABS Take 1 tablet by mouth daily.    Yes Historical Provider, MD  Omega-3 Fatty Acids (SALMON OIL PO) Take 1 capsule by mouth daily.    Yes Historical Provider, MD  Probiotic Product (PROBIOTIC COMPLEX ACIDOPHILUS PO) Take 1 capsule by mouth daily.    Yes Historical  Provider, MD  sodium chloride (OCEAN) 0.65 % nasal spray Place 1 spray into the nose daily as needed for congestion.    Yes Historical Provider, MD  dicyclomine (BENTYL) 10 MG capsule 10-20 mg up to 3 times daily for abdominal cramping. Patient not taking: Reported on 04/14/2016 03/11/16   Margarita Mail, PA-C  diphenhydrAMINE (BENADRYL) 25 MG tablet Take 25 mg by mouth at bedtime.    Historical Provider, MD  loratadine (CLARITIN) 10 MG tablet Take 10 mg by mouth daily.    Historical Provider, MD  omeprazole (PRILOSEC) 40 MG capsule Take 1 capsule (40 mg total) by mouth daily. Patient not taking: Reported on 04/14/2016 03/08/16   Darlyne Russian, MD     ROS: The patient denies chills, night sweats, unintentional weight loss, chest pain, palpitations, wheezing, dyspnea on exertion, nausea, vomiting, abdominal pain, dysuria, hematuria, melena, numbness, weakness, or tingling. Pt is positive for bilat lower extremity joint pain and swelling. Pt is positive for fever.  All other systems have been reviewed and were otherwise negative with the exception of those mentioned in the HPI and as above.    PHYSICAL EXAM: Vitals:   04/14/16 1407  BP: 102/64  Pulse: 84  Resp: 16  Temp: 98.8 F (37.1 C)   Body mass index is 23.6 kg/m.   General: Alert, no acute distress HEENT:  Normocephalic, atraumatic, oropharynx patent. Eye: [name] Cardiovascular:  Regular rate and rhythm, no rubs murmurs or gallops.  No Carotid bruits, radial pulse intact. No pedal edema.  Respiratory: Clear to auscultation bilaterally.  No wheezes, rales, or rhonchi.  No cyanosis, no use of accessory musculature Abdominal: No organomegaly, abdomen is soft and non-tender, positive bowel sounds.  No masses. Musculoskeletal: Gait intact. No edema, tenderness Skin: Pt has multiple firm, tender, erythematous nodules on her lower extremities bilat, extending from knees to toes.   Neurologic: Facial musculature  symmetric. Psychiatric: Patient acts appropriately throughout our interaction. Lymphatic: No cervical or submandibular lymphadenopathy    LABS: Results for orders placed or performed in visit on 04/14/16  POCT CBC  Result Value Ref Range   WBC 8.1 4.6 - 10.2 K/uL   Lymph, poc 1.6 0.6 - 3.4   POC LYMPH PERCENT 19.5 10 - 50 %L   MID (cbc) 0.4 0 - 0.9   POC MID % 5.0 0 - 12 %M   POC Granulocyte 6.1 2 - 6.9   Granulocyte percent 75.5 37 - 80 %G   RBC 4.42 4.04 - 5.48 M/uL   Hemoglobin 13.9 12.2 - 16.2 g/dL   HCT,  POC 39.3 37.7 - 47.9 %   MCV 88.8 80 - 97 fL   MCH, POC 31.3 (A) 27 - 31.2 pg   MCHC 35.3 31.8 - 35.4 g/dL   RDW, POC 13.5 %   Platelet Count, POC 264 142 - 424 K/uL   MPV 7.6 0 - 99.8 fL      EKG/XRAY:   Primary read interpreted by Dr. Everlene Farrier at [facility].   ASSESSMENT/PLAN: Patient has significant erythema nodosum involving both legs. This would be confirming that she has some type of underlying inflammatory bowel disease. Patient denies to get back to see Dr. Ardis Hughs. Labs were done.  She will be on prednisone 20 mg daily.I personally performed the services described in this documentation, which was scribed in my presence. The recorded information has been reviewed and is accurate.    Gross sideeffects, risk and benefits, and alternatives of medications d/w patient. Patient is aware that all medications have potential sideeffects and we are unable to predict every sideeffect or drug-drug interaction that may occur.  Arlyss Queen MD 04/14/2016 2:28 PM

## 2016-04-14 NOTE — Telephone Encounter (Signed)
The pt called and states she continues to have joint pain, with edema and redness over her joints that started over the weekend that has been getting worse. Is having a hard time walking.   Loss of appetite,and nausea as well as low grade fever.  These symptoms began after her colon on 04/08/16.  Took ibuprofen and tylenol but only gets minimal relief. Had left over hydrocodone and took that but still had no relief.  I suggested that the pt go to the ED for eval but she states that she would like for a MD to review first. She says that the copay is too much to go without absolutely necessary.    Dr Fuller Plan you are Doc of the day please advise.     04/08/16 colon per Dr Ardis Hughs  Impression:- Congested, furrowed and granular mucosa in the distal ileum. Biopsied. - The examination was otherwise normal on direct and retroflexion views.

## 2016-04-15 ENCOUNTER — Telehealth: Payer: Self-pay

## 2016-04-15 NOTE — Telephone Encounter (Signed)
Pt was seen on 04/14/16 by Dr. Everlene Farrier. She tried to get an appointment with her [facility] doc, but they can't see her until October. She would like [place] to start the referral process. Please advise at [phone]

## 2016-04-16 LAB — QUANTIFERON TB GOLD ASSAY (BLOOD)
Interferon Gamma Release Assay: NEGATIVE
Mitogen-Nil: 5.1 IU/mL
Quantiferon Nil Value: 0.03 IU/mL
Quantiferon Tb Ag Minus Nil Value: 0 IU/mL

## 2016-04-18 ENCOUNTER — Telehealth: Payer: Self-pay | Admitting: Gastroenterology

## 2016-04-18 ENCOUNTER — Telehealth: Payer: Self-pay

## 2016-04-18 LAB — ANTISTREPTOLYSIN O TITER: ASO: 27 IU/mL (ref <=200)

## 2016-04-18 NOTE — Telephone Encounter (Signed)
The pt is aware of the appt for 04/20/16 345 pm

## 2016-04-18 NOTE — Telephone Encounter (Signed)
The pt is aware that the phone note and records are available for Dr Ardis Hughs to review when he gets back on Wed.

## 2016-04-18 NOTE — Telephone Encounter (Signed)
Pt wants to know if she can cut her predniSONE (DELTASONE) 20 MG tablet in half because she is getting really red in the face and everything. Can someone please advise her thank you  Her call back number is [phone]

## 2016-04-18 NOTE — Telephone Encounter (Signed)
Ok, can you put her in for ROV this Wednesday afternooon.  Looks like there's an opening.  Thanks

## 2016-04-18 NOTE — Telephone Encounter (Signed)
Routed to Dr. Tamala Julian

## 2016-04-20 ENCOUNTER — Encounter: Payer: Self-pay | Admitting: Gastroenterology

## 2016-04-20 ENCOUNTER — Ambulatory Visit (INDEPENDENT_AMBULATORY_CARE_PROVIDER_SITE_OTHER): Payer: PRIVATE HEALTH INSURANCE | Admitting: Gastroenterology

## 2016-04-20 ENCOUNTER — Other Ambulatory Visit: Payer: PRIVATE HEALTH INSURANCE

## 2016-04-20 ENCOUNTER — Other Ambulatory Visit: Payer: Self-pay | Admitting: Gastroenterology

## 2016-04-20 VITALS — BP 130/90 | HR 84 | Ht 65.5 in | Wt 144.6 lb

## 2016-04-20 DIAGNOSIS — K50919 Crohn's disease, unspecified, with unspecified complications: Secondary | ICD-10-CM

## 2016-04-20 MED ORDER — PREDNISONE 10 MG PO TABS: ORAL_TABLET | ORAL | 0 refills | Status: DC

## 2016-04-20 NOTE — Telephone Encounter (Signed)
Left message for pt to call back  °

## 2016-04-20 NOTE — Progress Notes (Signed)
Review of pertinent gastrointestinal problems: 1. Abnormal distal ileum CT, abenopathy scan 02/2016, presented with RLQ pain.  Elevated sed rate.  Colonoscopy Dr. Ardis Hughs 03/2016 found normal colon, mildly abnormal terminal ileum (furrowed, congested) but biopsies were completely normal.  Developed e-nodosum like nodules on lower extremities 04/2016 and PCP put her on steroids (66m daily) with fast improvement in the nodules, fatigue.  HPI: This is a very pleasant 45year old woman whom I last saw about 2 weeks ago time of colonoscopy. See that summarized above.  Chief complaint is improved joint pain, improved nodules on legs  Started to have bilateral foot, ankle pain, swelling, then also lumps.  Also low grade fevers.  Pains in lower extremities got so bad, knees also that she found it hard to walk.    She's been constipated lately.  Has had nausea after eating.   Prednisone 244monce daily for a week and her feet, legs have much improved.     Past Medical History:  Diagnosis Date  . ADD (attention deficit disorder)   . Allergy   . Anemia   . Anxiety   . Hyperlipidemia   . Ileitis     Past Surgical History:  Procedure Laterality Date  . CESAREAN SECTION     x 2  . DENTAL SURGERY    . EYE SURGERY Bilateral    cryoretinopexy    Current Outpatient Prescriptions  Medication Sig Dispense Refill  . amphetamine-dextroamphetamine (ADDERALL) 10 MG tablet Take 10 mg once a day. May fill 30 days after rx (Patient taking differently: 5 mg. Take 10 mg once a day. May fill 30 days after rx) 30 tablet 0  . Boric Acid POWD Place 600 mg vaginally at bedtime. Insert in the vagina as directed.  Make up as boric acid vaginal suppository.  qhs x 1 week then twice weekly 14 g 1  . Cetirizine HCl (ZYRTEC ALLERGY PO) Take by mouth.    . Marland KitchenYDROcodone-acetaminophen (NORCO) 5-325 MG tablet Take 1 tablet by mouth every 6 (six) hours as needed for moderate pain. 20 tablet 0  . metoCLOPramide (REGLAN)  5 MG tablet Take 1 tablet (5 mg total) by mouth 4 (four) times daily as needed for nausea or vomiting. 30 tablet 0  . predniSONE (DELTASONE) 20 MG tablet Take 1 tablet (20 mg total) by mouth daily with breakfast. 10 tablet 0  . Probiotic Product (PROBIOTIC COMPLEX ACIDOPHILUS PO) Take 1 capsule by mouth daily.     . sodium chloride (OCEAN) 0.65 % nasal spray Place 1 spray into the nose daily as needed for congestion.     . TURMERIC PO Take by mouth 4 (four) times daily.    . Marland Kitchencetaminophen (TYLENOL) 500 MG tablet Take 1,000 mg by mouth every 6 (six) hours as needed for mild pain.    . Marland Kitchenlbuterol (PROVENTIL HFA;VENTOLIN HFA) 108 (90 BASE) MCG/ACT inhaler Inhale 2 puffs into the lungs every 6 (six) hours as needed for wheezing or shortness of breath. (Patient not taking: Reported on 04/20/2016) 1 Inhaler 0   Current Facility-Administered Medications  Medication Dose Route Frequency Provider Last Rate Last Dose  . 0.9 %  sodium chloride infusion  500 mL Intravenous Continuous DaMilus BanisterMD        Allergies as of 04/20/2016 - Review Complete 04/20/2016  Allergen Reaction Noted  . Avelox [moxifloxacin hcl in nacl] Other (See Comments) 09/28/2011  . Cefaclor Hives 09/28/2011  . Latex Other (See Comments) 09/28/2011    Family History  Problem Relation Age of Onset  . Heart disease Mother     CAD/stenting  . Hyperlipidemia Mother   . Arthritis Mother   . Hypertension Mother   . Kidney disease Mother   . Irritable bowel syndrome Mother   . Hyperlipidemia Father   . Hyperlipidemia Sister   . Colon cancer Neg Hx   . Inflammatory bowel disease Neg Hx     Social History   Social History  . Marital status: Married    Spouse name: N/A  . Number of children: N/A  . Years of education: N/A   Occupational History  . Not on file.   Social History Main Topics  . Smoking status: Former Research scientist (life sciences)  . Smokeless tobacco: Never Used  . Alcohol use 1.2 oz/week    2 Cans of beer per week      Comment: 2 beers daily  . Drug use: No  . Sexual activity: Yes   Other Topics Concern  . Not on file   Social History Narrative   Marital status: married      Children: 2 children (14, 32)      Employment:      Physical Exam: BP 130/90   Pulse 84   Ht 5' 5.5" (1.664 m)   Wt 144 lb 9.6 oz (65.6 kg)   LMP 03/29/2016 Comment: urine preg 03/08/16 neg  BMI 23.70 kg/m  Constitutional: generally well-appearing Psychiatric: alert and oriented x3 Abdomen: soft, nontender, nondistended, no obvious ascites, no peritoneal signs, normal bowel sounds   Assessment and plan: 45 y.o. female with abnormal terminal ileum, adjacent adenopathy, erythema nodosum and lower extremity arthritis recently  This would be a very unusual presentation for Crohn's disease but that may indeed be what she has. Her terminal ileum appeared normal on CT scan surrounded by some adenopathy. Biopsies during her colonoscopy of her distal ileum, an area. Some somewhat congested endoscopically, were completely normal without any signs of active or chronic inflammation.  Her colon was completely normal. She is generally been more constipated lately rather than having significant loose stools. She had lower extremity swelling, erythema, and painful nodules that certainly might be erythema nodosum. These have responded quickly to prednisone 20 mg per day. I am still however not convinced that she has underlying inflammatory bowel disease. Other things can cause erythema nodosum such as sarcoidosis, back chest disease, acute infections. Getting some further blood work that may help indicate she has underlying inflammatory bowel disease. I'm also ordering an MR enterography to check the status of the distal ileitis, look for other signs of inflammation in her GI tract as well. I am also going to get her in expeditiously to see a rheumatologist to see if there are any other possibilities here. For now she will stay on prednisone but  I've asked her to start decreasing it by 5 mg per week. She is currently on 20 mg.   Owens Loffler, MD [facility] 04/20/2016, 3:58 PM

## 2016-04-20 NOTE — Patient Instructions (Addendum)
Referral to rheumatology for expedited appt.    Dr Marjory Lies  04/26/16 9 am  [address]  [place] [address] [phone]  Stay on prednisone (29m pill, one pill twice daily for now). Start tapering by 513mper week., new script written today.  Please return to see Dr. JaArdis Hughsn 2 months.  MR Enterography, ?crohn's disease.  You have been scheduled for an MR Enterography at [facility] 04/26/16. Your appointment time is 3 pm. Please arrive 1 1/2 hours prior to your appointment time for registration purposes. Please make certain not to have anything to eat or drink 4 hours prior to your test. In addition, if you have any metal in your body, have a pacemaker or defibrillator, please be sure to let your ordering physician know. This test typically takes about 3 hours.   You will have labs checked today in the basement lab.  Please head down after you check out with the front desk  ([facility] first step).

## 2016-04-20 NOTE — Telephone Encounter (Signed)
Yes that would be fine to take a half tablet a day. Be sure she is seeing Dr. Ardis Hughs. I also will be in the office Thursday and Friday and would be happy to recheck her rash. She can call after 4 for an appointment.

## 2016-04-20 NOTE — Telephone Encounter (Signed)
Patient evaluated by Dr. Everlene Farrier on 04/14/16; will route to Dr. Everlene Farrier.

## 2016-04-21 ENCOUNTER — Telehealth: Payer: Self-pay

## 2016-04-21 ENCOUNTER — Other Ambulatory Visit: Payer: Self-pay | Admitting: Emergency Medicine

## 2016-04-21 ENCOUNTER — Telehealth: Payer: Self-pay | Admitting: Emergency Medicine

## 2016-04-21 DIAGNOSIS — K529 Noninfective gastroenteritis and colitis, unspecified: Secondary | ICD-10-CM

## 2016-04-21 NOTE — Telephone Encounter (Signed)
Call patient and tell her I would like to see her tomorrow. She can call after 4:00 today for an appointment time.

## 2016-04-21 NOTE — Telephone Encounter (Signed)
Patient is returning a phone call for Robin [name]. Also patient wants to let Dr. Everlene Farrier know that she saw a GI yesterday 04/20/16 and is being referred for additional testing including a MRI and [facility] IBD testing. Patient also states that she being referred to a rheumatologist. [phone]

## 2016-04-21 NOTE — Telephone Encounter (Signed)
Spoke with pt, she has seen GI doctor this week and will touch base with Dr. Everlene Farrier after.

## 2016-04-22 ENCOUNTER — Telehealth: Payer: Self-pay | Admitting: Emergency Medicine

## 2016-04-22 ENCOUNTER — Ambulatory Visit (HOSPITAL_COMMUNITY)
Admission: EM | Admit: 2016-04-22 | Discharge: 2016-04-22 | Disposition: A | Discharge status: Home / Self Care | Payer: PRIVATE HEALTH INSURANCE | Attending: Family Medicine | Admitting: Family Medicine

## 2016-04-22 ENCOUNTER — Encounter (HOSPITAL_COMMUNITY): Payer: Self-pay | Admitting: Emergency Medicine

## 2016-04-22 DIAGNOSIS — G43009 Migraine without aura, not intractable, without status migrainosus: Secondary | ICD-10-CM | POA: Diagnosis not present

## 2016-04-22 DIAGNOSIS — I158 Other secondary hypertension: Secondary | ICD-10-CM

## 2016-04-22 LAB — PROMETHEUS-MAIL

## 2016-04-22 MED ORDER — ONDANSETRON 4 MG PO TBDP
ORAL_TABLET | ORAL | Status: AC
  Filled 2016-04-22: qty 2

## 2016-04-22 MED ORDER — DIPHENHYDRAMINE HCL 50 MG/ML IJ SOLN
INTRAMUSCULAR | Status: AC
  Filled 2016-04-22: qty 1

## 2016-04-22 MED ORDER — KETOROLAC TROMETHAMINE 60 MG/2ML IM SOLN
INTRAMUSCULAR | Status: AC
  Filled 2016-04-22: qty 2

## 2016-04-22 MED ORDER — ONDANSETRON HCL 4 MG/2ML IJ SOLN
4.0000 mg | Freq: Once | INTRAMUSCULAR | Status: AC
  Administered 2016-04-22: 4 mg via INTRAMUSCULAR

## 2016-04-22 MED ORDER — ONDANSETRON 4 MG PO TBDP
8.0000 mg | ORAL_TABLET | Freq: Once | ORAL | Status: AC
  Administered 2016-04-22: 8 mg via ORAL

## 2016-04-22 MED ORDER — DIPHENHYDRAMINE HCL 25 MG PO CAPS
ORAL_CAPSULE | ORAL | Status: AC
  Filled 2016-04-22: qty 2

## 2016-04-22 MED ORDER — KETOROLAC TROMETHAMINE 60 MG/2ML IM SOLN
60.0000 mg | Freq: Once | INTRAMUSCULAR | Status: AC
  Administered 2016-04-22: 60 mg via INTRAMUSCULAR

## 2016-04-22 MED ORDER — DIMENHYDRINATE 50 MG/ML IJ SOLN: 50.0000 mg | Freq: Once | INTRAMUSCULAR | Status: DC

## 2016-04-22 MED ORDER — DIPHENHYDRAMINE HCL 25 MG PO CAPS
50.0000 mg | ORAL_CAPSULE | Freq: Once | ORAL | Status: AC
  Administered 2016-04-22: 50 mg via ORAL

## 2016-04-22 MED ORDER — ONDANSETRON HCL 4 MG/2ML IJ SOLN
INTRAMUSCULAR | Status: AC
  Filled 2016-04-22: qty 2

## 2016-04-22 NOTE — ED Notes (Signed)
Notified Dr. Bonner Puna that pt vomited 2-5 min after giving po meds.

## 2016-04-22 NOTE — ED Notes (Signed)
Notified Dr. Hubbard Robinson

## 2016-04-22 NOTE — Telephone Encounter (Signed)
Call patient. This is great. I'm glad she is seen the specialist to get help with her situation.

## 2016-04-22 NOTE — Discharge Instructions (Signed)
Take reglan and hydrocodone at the first hint of a migraine/aura. If this isn't helping, you should proceed to medical care. If you are unable to keep fluids down, you also need to present for care.   Take prednisone 22m every morning until you follow up with the rheumatologist on Tuesday.

## 2016-04-22 NOTE — ED Provider Notes (Signed)
Robin [name]    CSN: [id number] Arrival date & time: 04/22/16  1945  First Provider Contact:  First MD Initiated Contact with Patient 04/22/16 1953     History   Chief Complaint Chief Complaint  Patient presents with  . Headache   HPI Robin [name] is a 45 y.o. female with a history of IBD presenting for headache.   She reports severe unilateral pounding headache consistent with prior migraines but worse in severity developing over the course of the day. Associated with photophobia but no visual changes, and nausea with vomiting. She took hydrocodone prescribed for erythema nodosum recently to help in addition to reglan but vomited shortly afterward and feels she absorbed none of this. She denies auditory changes, fever, neck stiffness/pain.   She started prednisone 55m 7 days ago for erythema nodosum. She's noted her BP to be elevated since that time. She's got follow up with rheumatology and an MRI scheduled for next week for investigation of IBD, and remarks that this is very stressful for her.   Past Medical History:  Diagnosis Date  . ADD (attention deficit disorder)   . Allergy   . Anemia   . Anxiety   . Hyperlipidemia   . Ileitis     Patient Active Problem List   Diagnosis Date Noted  . 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 RLQ abdominal pain 03/18/2016  . ADHD (attention deficit hyperactivity disorder) 12/22/2014  . Raynaud's phenomenon 10/17/2013  . Hyperlipidemia 09/28/2011    Past Surgical History:  Procedure Laterality Date  . CESAREAN SECTION     x 2  . DENTAL SURGERY    . EYE SURGERY Bilateral    cryoretinopexy   OB History    No data available     Home Medications    Prior to Admission medications   Medication Sig Start Date End Date Taking? Authorizing Provider  amphetamine-dextroamphetamine (ADDERALL) 10 MG tablet Take 10 mg once a day. May fill 30 days after rx Patient taking differently: 5 mg. Take 10 mg once a day. May fill 30  days after rx 03/08/16  Yes SDarlyne Russian MD  HYDROcodone-acetaminophen (NORCO) 5-325 MG tablet Take 1 tablet by mouth every 6 (six) hours as needed for moderate pain. 04/14/16  Yes SDarlyne Russian MD  metoCLOPramide (REGLAN) 5 MG tablet Take 1 tablet (5 mg total) by mouth 4 (four) times daily as needed for nausea or vomiting. 03/11/16  Yes AMargarita Mail PA-C  predniSONE (DELTASONE) 10 MG tablet Take twice daily for 1 week, then decrease by 5 mg every week until off. 04/20/16  Yes DMilus Banister MD  acetaminophen (TYLENOL) 500 MG tablet Take 1,000 mg by mouth every 6 (six) hours as needed for mild pain.    Historical Provider, MD  albuterol (PROVENTIL HFA;VENTOLIN HFA) 108 (90 BASE) MCG/ACT inhaler Inhale 2 puffs into the lungs every 6 (six) hours as needed for wheezing or shortness of breath. Patient not taking: Reported on 04/20/2016 08/14/15   JDarreld Mclean MD  Boric Acid POWD Place 600 mg vaginally at bedtime. Insert in the vagina as directed.  Make up as boric acid vaginal suppository.  qhs x 1 week then twice weekly 09/08/15   KRobyn Haber MD  Cetirizine HCl (ZYRTEC ALLERGY PO) Take by mouth.    Historical Provider, MD  predniSONE (DELTASONE) 20 MG tablet Take 1 tablet (20 mg total) by mouth daily with breakfast. 04/14/16   SDarlyne Russian MD  Probiotic Product (PROBIOTIC  COMPLEX ACIDOPHILUS PO) Take 1 capsule by mouth daily.     Historical Provider, MD  sodium chloride (OCEAN) 0.65 % nasal spray Place 1 spray into the nose daily as needed for congestion.     Historical Provider, MD  TURMERIC PO Take by mouth 4 (four) times daily.    Historical Provider, MD    Family History Family History  Problem Relation Age of Onset  . Heart disease Mother     CAD/stenting  . Hyperlipidemia Mother   . Arthritis Mother   . Hypertension Mother   . Kidney disease Mother   . Irritable bowel syndrome Mother   . Hyperlipidemia Father   . Hyperlipidemia Sister   . Colon cancer Neg Hx   . Inflammatory  bowel disease Neg Hx     Social History Social History  Substance Use Topics  . Smoking status: Former Research scientist (life sciences)  . Smokeless tobacco: Never Used  . Alcohol use 1.2 oz/week    2 Cans of beer per week     Comment: 2 beers daily   Allergies   Avelox [moxifloxacin hcl in nacl]; Cefaclor; and Latex  Review of Systems Review of Systems As above  Physical Exam Triage Vital Signs ED Triage Vitals  Enc Vitals Group     BP      Pulse      Resp      Temp      Temp src      SpO2      Weight      Height      Head Circumference      Peak Flow      Pain Score      Pain Loc      Pain Edu?      Excl. in [place]?    No data found.  Updated Vital Signs Pulse 74   Temp 97.9 F (36.6 C) (Oral)   LMP 03/29/2016 Comment: urine preg 03/08/16 neg  SpO2 100%   Physical Exam  Constitutional: She is oriented to person, place, and time. She appears well-developed and well-nourished.  in evident discomfort, crying at times  Eyes: Conjunctivae and EOM are normal. Pupils are equal, round, and reactive to light.  no papilledema or hemorrhages on fundoscopic exam  Neck: Normal range of motion. Neck supple. No JVD present.  Cardiovascular: Normal rate and regular rhythm.   No murmur heard. Pulmonary/Chest: Effort normal and breath sounds normal. No respiratory distress.  Abdominal: Soft. Bowel sounds are normal. She exhibits no distension. There is no tenderness.  Lymphadenopathy:    She has no cervical adenopathy.  Neurological: She is alert and oriented to person, place, and time. She has normal reflexes. No cranial nerve deficit. She exhibits normal muscle tone.  Skin: Skin is warm and dry. Capillary refill takes less than 2 seconds.  Vitals reviewed.  UC Treatments / Results  Labs (all labs ordered are listed, but only abnormal results are displayed) Labs Reviewed - No data to display  EKG  EKG Interpretation None      Radiology No results found.  Procedures Procedures  (including critical care time)  Medications Ordered in UC Medications  diphenhydrAMINE (BENADRYL) capsule 50 mg (50 mg Oral Given 04/22/16 2029)  ketorolac (TORADOL) injection 60 mg (60 mg Intramuscular Given 04/22/16 2029)  ondansetron (ZOFRAN-ODT) disintegrating tablet 8 mg (8 mg Oral Given 04/22/16 2029)  dimenhyDRINATE 50 MG/ML injection 50 mg (50 mg Intramuscular Given 04/22/16 2046)  ondansetron (ZOFRAN) injection 4 mg (4 mg  Intramuscular Given 04/22/16 2046)   Initial Impression / Assessment and Plan / UC Course  I have reviewed the triage vital signs and the nursing notes.  Pertinent labs & imaging results that were available during my care of the patient were reviewed by me and considered in my medical decision making (see chart for details).  Final Clinical Impressions(s) / UC Diagnoses   Final diagnoses:  Migraine without aura and without status migrainosus, not intractable  Other secondary hypertension   45 y.o. female recently started on prednisone taper for erythema nodosum/IBD which, in addition to increased health-related stress, has triggered migraine. Given migraine cocktail (antihistamine, zofran, toradol) here with significant relief.  - Will accelerate prednisone taper down to 63m daily until seen by rheumatology next week.  HTN likely due to steroid, but also pain as elevated pressures have all occurred in setting of migraine today. Not severe range.  - Continue to monitor BP in ambulatory setting.  New Prescriptions New Prescriptions   No medications on file     RPatrecia Pour MD 04/22/16 2123

## 2016-04-22 NOTE — ED Triage Notes (Signed)
Pt c/o elevated BP and a constant HA onset 11:30 today... Reports she's taking hydrocodone w/temp relief.... Pain increases w/bright light and loud noises... Pt started to cry in the room due to the pain... Pain is 9/10... A&O x4... NAD

## 2016-04-22 NOTE — Telephone Encounter (Signed)
Patient called stating she was having a severe unilateral headache. She had had vomiting with the headache. Her pressure was elevated at 128/80 today which is high for her. She is currently on prednisone and concerned this may be the cause of her elevated pressure. She currently is on prednisone for erythema nodosum. I advised her to go to [facility] urgent care for blood pressure check and reevaluation. She will most likely need a lower dose of prednisone if this is triggering elevated pressure. She is due to start a taper dose after tomorrow.

## 2016-04-22 NOTE — ED Notes (Signed)
Pt reports feeling better

## 2016-04-23 ENCOUNTER — Telehealth: Payer: Self-pay | Admitting: Emergency Medicine

## 2016-04-23 NOTE — Telephone Encounter (Signed)
Please call and check on status today. Be sure patient is seen today by Dr. Tamala [name] if not improving.

## 2016-04-26 ENCOUNTER — Other Ambulatory Visit (HOSPITAL_COMMUNITY): Payer: Self-pay | Admitting: Rheumatology

## 2016-04-26 ENCOUNTER — Ambulatory Visit (HOSPITAL_COMMUNITY)
Admission: RE | Admit: 2016-04-26 | Discharge: 2016-04-26 | Disposition: A | Discharge status: Home / Self Care | Payer: PRIVATE HEALTH INSURANCE | Source: Ambulatory Visit | Attending: Rheumatology | Admitting: Rheumatology

## 2016-04-26 ENCOUNTER — Ambulatory Visit (HOSPITAL_COMMUNITY)
Admission: RE | Admit: 2016-04-26 | Discharge: 2016-04-26 | Disposition: A | Discharge status: Home / Self Care | Payer: PRIVATE HEALTH INSURANCE | Source: Ambulatory Visit | Attending: Gastroenterology | Admitting: Gastroenterology

## 2016-04-26 ENCOUNTER — Ambulatory Visit (HOSPITAL_COMMUNITY): Payer: PRIVATE HEALTH INSURANCE

## 2016-04-26 DIAGNOSIS — Z9225 Personal history of immunosupression therapy: Secondary | ICD-10-CM | POA: Insufficient documentation

## 2016-04-26 DIAGNOSIS — R195 Other fecal abnormalities: Secondary | ICD-10-CM | POA: Diagnosis not present

## 2016-04-26 DIAGNOSIS — K50919 Crohn's disease, unspecified, with unspecified complications: Secondary | ICD-10-CM | POA: Diagnosis present

## 2016-04-26 DIAGNOSIS — D1803 Hemangioma of intra-abdominal structures: Secondary | ICD-10-CM | POA: Insufficient documentation

## 2016-04-26 DIAGNOSIS — T451X5A Adverse effect of antineoplastic and immunosuppressive drugs, initial encounter: Secondary | ICD-10-CM

## 2016-04-26 MED ORDER — GADOBENATE DIMEGLUMINE 529 MG/ML IV SOLN
15.0000 mL | Freq: Once | INTRAVENOUS | Status: AC | PRN
  Administered 2016-04-26: 14 mL via INTRAVENOUS

## 2016-05-06 ENCOUNTER — Telehealth: Payer: Self-pay | Admitting: Gastroenterology

## 2016-05-06 NOTE — Telephone Encounter (Signed)
Pt states she has begun to have joint pain since getting to 5 mg of prednisone daily.  She was told to let Dr Ardis Hughs know if things changed.  She is going to continue 5 mg through the weekend and I will let Dr Ardis Hughs know and call her back with recommendations.

## 2016-05-06 NOTE — Telephone Encounter (Signed)
Left message on machine to call back  

## 2016-05-06 NOTE — Telephone Encounter (Signed)
Patient returned phone call. Best # [phone]

## 2016-05-07 NOTE — Telephone Encounter (Signed)
Ok.  Have her go back to 79m per day for another 10 days and then try again to taper to 5 mg.    Has she seen rheumatology yet?   I have not seen a note from them.

## 2016-05-09 NOTE — Telephone Encounter (Signed)
The pt is aware and will go back to 10 mg prednisone daily and will taper to 5 mg in 10 days.  She saw Rheumatology today and they will be faxing notes.  She is also asking if you have reviewed her prometheus test. Please advise.

## 2016-05-10 NOTE — Telephone Encounter (Signed)
I could have sworn that I saw it (was "not consistent with IBD") and also sent a note about it but I don't see that anywhere in EPIC.  Can you get the result sent again from prometheus to verify?  thanks

## 2016-05-11 NOTE — Telephone Encounter (Signed)
Pt notified and Dr Marjory Lies will be notified of the lab result.  The pt will call in 7-10 days to report on symptoms

## 2016-05-11 NOTE — Telephone Encounter (Signed)
The result is scanned under media it does say "not consistent with IBD"  I do not see a note myself.  Please advise.

## 2016-05-11 NOTE — Telephone Encounter (Signed)
OK, please let her know about the prometheus result and also forward a copy of the result to her rheumatologist.    She needs to call in 7-10 days to report on her symptoms.

## 2016-05-19 ENCOUNTER — Telehealth: Payer: Self-pay

## 2016-05-19 NOTE — Telephone Encounter (Signed)
Pt has just been diagnosed with Lupus. Her rheumatologist told her she needs a PNA shot. She would like to know which PNA shot she needs. She would like to know if we could send this script to her pharmacy or if she needs to come in for an OV.Pharmacy:  [facility] [place], [place]. Please advise at [phone]

## 2016-05-20 NOTE — Telephone Encounter (Signed)
That is correct.  We can get her a pneumo shot.  Have her come in for a brief office visit. Philis Fendt, MS, PA-C 1:05 PM, 05/20/2016

## 2016-05-20 NOTE — Telephone Encounter (Signed)
I believe she is referring to pneumonia shot. Is this correct? She could get this here.

## 2016-05-23 ENCOUNTER — Ambulatory Visit (INDEPENDENT_AMBULATORY_CARE_PROVIDER_SITE_OTHER): Payer: PRIVATE HEALTH INSURANCE | Admitting: Physician Assistant

## 2016-05-23 VITALS — BP 118/76 | HR 68 | Temp 98.4°F | Resp 16 | Ht 65.0 in | Wt 148.0 lb

## 2016-05-23 DIAGNOSIS — Z23 Encounter for immunization: Secondary | ICD-10-CM | POA: Diagnosis not present

## 2016-05-23 DIAGNOSIS — M329 Systemic lupus erythematosus, unspecified: Secondary | ICD-10-CM | POA: Insufficient documentation

## 2016-05-23 DIAGNOSIS — IMO0002 Reserved for concepts with insufficient information to code with codable children: Secondary | ICD-10-CM | POA: Insufficient documentation

## 2016-05-23 NOTE — Telephone Encounter (Signed)
LMOM to advise to come in and we can get her the PNA vaccine needed.

## 2016-05-23 NOTE — Progress Notes (Signed)
   05/23/2016 5:35 PM   DOB: [birth year] / MRN: [id number]  SUBJECTIVE:  Robin [name] is a 45 y.o. female presenting for need for pneumococcal shot.  She would also like a flu shot.  Was recently diagnosed with Lupus by rheumatology and was advised that she would strep pneumo vaccine before started plaquenil.  She feels well today.   Immunization History  Administered Date(s) Administered  . Influenza,inj,Quad PF,36+ Mos 05/12/2014, 11/27/2015  . Tdap 09/28/2011     She is allergic to avelox [moxifloxacin hcl in nacl]; cefaclor; and latex.   She  has a past medical history of ADD (attention deficit disorder); Allergy; Anemia; Anxiety; Hyperlipidemia; and Ileitis.    She  reports that she has quit smoking. She has never used smokeless tobacco. She reports that she drinks about 1.2 oz of alcohol per week . She reports that she does not use drugs. She  reports that she currently engages in sexual activity. The patient  has a past surgical history that includes Eye surgery (Bilateral); Cesarean section; and Dental surgery.  Her family history includes Arthritis in her mother; Heart disease in her mother; Hyperlipidemia in her father, mother, and sister; Hypertension in her mother; Irritable bowel syndrome in her mother; Kidney disease in her mother.  Review of Systems  Constitutional: Negative for chills and fever.    The problem list and medications were reviewed and updated by myself where necessary and exist elsewhere in the encounter.   OBJECTIVE:  BP 118/76 (BP Location: Right Arm, Patient Position: Sitting, Cuff Size: Normal)   Pulse 68   Temp 98.4 F (36.9 C) (Oral)   Resp 16   Ht 5' 5"  (1.651 m)   Wt 148 lb (67.1 kg)   LMP 05/14/2016 (Exact Date)   SpO2 98%   BMI 24.63 kg/m   Physical Exam  Constitutional: Vital signs are normal. She appears well-developed and well-nourished.  Cardiovascular: Normal rate.     No results found for this or any previous visit (from the  past 72 hour(s)).  No results found.  ASSESSMENT AND PLAN  Oriyah was seen today for immunizations.  Diagnoses and all orders for this visit:  Flu vaccine need -     Flu Vaccine QUAD 36+ mos IM -     Flu Vaccine QUAD 36+ mos IM  Need for prophylactic vaccination against Streptococcus pneumoniae (pneumococcus): I have placed a future 6 month order for her pneumovax.  -     Pneumococcal conjugate vaccine 13-valent IM -     Pneumococcal polysaccharide vaccine 23-valent greater than or equal to 2yo subcutaneous/IM; Future    The patient is advised to call or return to clinic if she does not see an improvement in symptoms, or to seek the care of the closest emergency department if she worsens with the above plan.   Philis Fendt, MHS, PA-C [facility] 05/23/2016 5:35 PM

## 2016-05-23 NOTE — Patient Instructions (Signed)
     IF you received an x-ray today, you will receive an invoice from [facility]. Please contact [facility] at [phone] with questions or concerns regarding your invoice.   IF you received labwork today, you will receive an invoice from [facility]. Please contact [facility] at [phone] with questions or concerns regarding your invoice.   Our billing staff will not be able to assist you with questions regarding bills from these companies.  You will be contacted with the lab results as soon as they are available. The fastest way to get your results is to activate your My Chart account. Instructions are located on the last page of this paperwork. If you have not heard from us regarding the results in 2 weeks, please contact this office.      

## 2016-06-15 ENCOUNTER — Telehealth: Payer: Self-pay | Admitting: Gastroenterology

## 2016-06-15 NOTE — Telephone Encounter (Signed)
Pt has been notified to keep appt as scheduled

## 2016-06-22 NOTE — ED Notes (Addendum)
Late entry: dimenhyDRINATE 50 MG/ML injection was not given  Should have been diphenhydrAMINE (BENADRYL) injection 50 mg @ 2046, left upper quadrant

## 2016-07-01 ENCOUNTER — Ambulatory Visit: Payer: PRIVATE HEALTH INSURANCE | Admitting: Gastroenterology

## 2016-07-04 DIAGNOSIS — L52 Erythema nodosum: Secondary | ICD-10-CM | POA: Insufficient documentation

## 2016-07-04 DIAGNOSIS — M722 Plantar fascial fibromatosis: Secondary | ICD-10-CM | POA: Insufficient documentation

## 2016-07-04 DIAGNOSIS — Z79899 Other long term (current) drug therapy: Secondary | ICD-10-CM | POA: Insufficient documentation

## 2016-07-04 NOTE — Progress Notes (Signed)
*IMAGE* Office Visit Note  Patient: Robin [name]             Date of Birth: Feb 16, 1971           MRN: [id number]             PCP: Reginia Forts, MD Referring: Wardell Honour, MD Visit Date: 07/05/2016    Subjective:  Follow-up (New Patient Follow up)   History of Present Illness: Robin [name] is a 45 y.o. female with history of autoimmune disease. She states that she's been on lactulose for 6 weeks now and she has not had any fever or nodules or joint pain since then. 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 She has been experiencing some tingling sensations in her hands and feet and lips. She's not sure if it's related to the medications or medication taper. She denies any oral ulcers or malar rash.   Activities of Daily Living:  Patient reports morning stiffness for 0 minutes.   Patient Denies nocturnal pain.  Difficulty dressing/grooming: Denies Difficulty climbing stairs: Denies Difficulty getting out of chair: Denies Difficulty using hands for taps, buttons, cutlery, and/or writing: Denies   Review of Systems  Constitutional: Positive for fatigue.  HENT: Positive for mouth dryness.   Eyes: Positive for redness.  Respiratory: Negative.   Cardiovascular: Negative.   Gastrointestinal: Negative.        Decreased appetite  Endocrine: Negative.   Genitourinary: Negative.   Musculoskeletal: Positive for myalgias and myalgias.  Allergic/Immunologic: Negative.   Neurological: Negative.  Positive for memory loss.  Hematological: Positive for swollen glands.  Psychiatric/Behavioral: Positive for depressed mood and sleep disturbance. The patient is nervous/anxious.     PMFS History:  Patient Active Problem List   Diagnosis Date Noted  . Plantar fasciitis 07/04/2016  . Erythema nodosum 07/04/2016  . High risk medication use 07/04/2016  . Lupus 05/23/2016  . 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 RLQ abdominal  pain 03/18/2016  . ADHD (attention deficit hyperactivity disorder) 12/22/2014  . Raynaud's phenomenon 10/17/2013  . Hyperlipidemia 09/28/2011    Past Medical History:  Diagnosis Date  . ADD (attention deficit disorder)   . Allergy   . Anemia   . Anxiety   . Hyperlipidemia   . Ileitis     Family History  Problem Relation Age of Onset  . Heart disease Mother     CAD/stenting  . Hyperlipidemia Mother   . Arthritis Mother   . Hypertension Mother   . Kidney disease Mother   . Irritable bowel syndrome Mother   . Hyperlipidemia Father   . Hyperlipidemia Sister   . Colon cancer Neg Hx   . Inflammatory bowel disease Neg Hx    Past Surgical History:  Procedure Laterality Date  . CESAREAN SECTION     x 2  . DENTAL SURGERY    . EYE SURGERY Bilateral    cryoretinopexy   Social History   Social History Narrative   Marital status: married      Children: 2 children (14, 25)      Employment:      Objective: Vital Signs: BP 109/70 (BP Location: Left Arm, Patient Position: Sitting, Cuff Size: Large)   Pulse 72   Resp 12   Ht 5' 6"  (1.676 m)   Wt 150 lb (68 kg)   BMI 24.21 kg/m    Physical Exam  Constitutional: She is oriented to person, place, and  time. She appears well-developed and well-nourished.  HENT:  Head: Normocephalic and atraumatic.  Eyes: Conjunctivae and EOM are normal. Pupils are equal, round, and reactive to light.  Neck: Normal range of motion. Neck supple.  Cardiovascular: Normal rate, regular rhythm, normal heart sounds and intact distal pulses.   Pulmonary/Chest: Effort normal and breath sounds normal.  Abdominal: Soft. Bowel sounds are normal.  Musculoskeletal: Normal range of motion.  Neurological: She is alert and oriented to person, place, and time. She has normal reflexes.  Skin: Skin is warm and dry. Capillary refill takes more than 3 seconds.  Psychiatric: She has a normal mood and affect.     Musculoskeletal Exam:She has good range of motion  of her C-spine and thoracic lumbar spine and shoulders elbows joint space joint MCPs PIPs DIPs of her hands. Hip joints knee joints and ankle joints MTPs PIPs with good range of motion with no synovitis.  CDAI Exam: No CDAI exam completed.    Investigation: No additional findings.   Imaging: No results found.  Speciality Comments: No specialty comments available.    Procedures:  No procedures performed Allergies: Avelox [moxifloxacin hcl in nacl]; Cefaclor; and Latex   Assessment / Plan: Visit Diagnoses: Systemic lupus erythematosus, unspecified SLE type Her symptoms seems to be quite well-controlled on Plaquenil and prednisone taper. I'm uncertain if her neurology is unrelated to her medications. I have advised her to continue to taper her prednisone and continue hydroxychloroquine as well. If her neurology as do not improve she is supposed to notify me.  Raynaud's phenomenon It is not active currently  Plantar fasciitis Her symptoms have improved.  Erythema nodosum No active lesions.  High-risk prescription We will monitor labs every 3 months for hydroxychloroquine use . She will get labs today. I will also give her Plaquenil eye exam form.  No problem-specific Assessment & Plan notes found for this encounter.   Follow-Up Instructions: Return in about 3 months (around 10/05/2016) for Systemic lupus.  Orders: Orders Placed This Encounter  Procedures  . CBC with Differential/Platelet  . COMPLETE METABOLIC PANEL WITH GFR  . CBC with Differential/Platelet  . COMPLETE METABOLIC PANEL WITH GFR

## 2016-07-05 ENCOUNTER — Ambulatory Visit (INDEPENDENT_AMBULATORY_CARE_PROVIDER_SITE_OTHER): Payer: PRIVATE HEALTH INSURANCE | Admitting: Rheumatology

## 2016-07-05 ENCOUNTER — Encounter: Payer: Self-pay | Admitting: Rheumatology

## 2016-07-05 VITALS — BP 109/70 | HR 72 | Resp 12 | Ht 66.0 in | Wt 150.0 lb

## 2016-07-05 DIAGNOSIS — L52 Erythema nodosum: Secondary | ICD-10-CM

## 2016-07-05 DIAGNOSIS — M722 Plantar fascial fibromatosis: Secondary | ICD-10-CM

## 2016-07-05 DIAGNOSIS — Z79899 Other long term (current) drug therapy: Secondary | ICD-10-CM

## 2016-07-05 DIAGNOSIS — M329 Systemic lupus erythematosus, unspecified: Secondary | ICD-10-CM | POA: Diagnosis not present

## 2016-07-05 DIAGNOSIS — I7300 Raynaud's syndrome without gangrene: Secondary | ICD-10-CM | POA: Diagnosis not present

## 2016-07-05 LAB — CBC WITH DIFFERENTIAL/PLATELET
Basophils Absolute: 0 cells/uL (ref 0–200)
Basophils Relative: 0 %
Eosinophils Absolute: 68 cells/uL (ref 15–500)
Eosinophils Relative: 1 %
HCT: 38.6 % (ref 35.0–45.0)
Hemoglobin: 12.8 g/dL (ref 11.7–15.5)
Lymphocytes Relative: 21 %
Lymphs Abs: 1428 cells/uL (ref 850–3900)
MCH: 30.8 pg (ref 27.0–33.0)
MCHC: 33.2 g/dL (ref 32.0–36.0)
MCV: 92.8 fL (ref 80.0–100.0)
MPV: 9.3 fL (ref 7.5–12.5)
Monocytes Absolute: 408 cells/uL (ref 200–950)
Monocytes Relative: 6 %
Neutro Abs: 4896 cells/uL (ref 1500–7800)
Neutrophils Relative %: 72 %
Platelets: 269 10*3/uL (ref 140–400)
RBC: 4.16 MIL/uL (ref 3.80–5.10)
RDW: 14.5 % (ref 11.0–15.0)
WBC: 6.8 10*3/uL (ref 3.8–10.8)

## 2016-07-05 LAB — COMPLETE METABOLIC PANEL WITH GFR
ALT: 8 U/L (ref 6–29)
AST: 15 U/L (ref 10–30)
Albumin: 4.5 g/dL (ref 3.6–5.1)
Alkaline Phosphatase: 23 U/L — ABNORMAL LOW (ref 33–115)
BUN: 12 mg/dL (ref 7–25)
CO2: 27 mmol/L (ref 20–31)
Calcium: 9.2 mg/dL (ref 8.6–10.2)
Chloride: 103 mmol/L (ref 98–110)
Creat: 0.75 mg/dL (ref 0.50–1.10)
GFR, Est African American: >89 mL/min (ref >=60)
GFR, Est Non African American: >89 mL/min (ref >=60)
Glucose, Bld: 92 mg/dL (ref 65–99)
Potassium: 4 mmol/L (ref 3.5–5.3)
Sodium: 139 mmol/L (ref 135–146)
Total Bilirubin: 0.4 mg/dL (ref 0.2–1.2)
Total Protein: 7.1 g/dL (ref 6.1–8.1)

## 2016-07-05 MED ORDER — PREDNISONE 10 MG PO TABS: ORAL_TABLET | ORAL | 0 refills | Status: DC

## 2016-07-05 NOTE — Patient Instructions (Signed)
Eye exam form for PLQ

## 2016-07-06 ENCOUNTER — Encounter: Payer: Self-pay | Admitting: Radiology

## 2016-07-06 ENCOUNTER — Telehealth: Payer: Self-pay | Admitting: Radiology

## 2016-07-06 DIAGNOSIS — Z79899 Other long term (current) drug therapy: Secondary | ICD-10-CM

## 2016-07-06 NOTE — Telephone Encounter (Signed)
I have called patient to advise labs are normal  

## 2016-07-06 NOTE — Assessment & Plan Note (Deleted)
Patient has had normal PLQ eye exam on 07/06/16 / will have yearly PLQ eye exams Dr Drake Leach OD Small focal area of Hypopigmentation no bulls eye maculopathy OS

## 2016-07-13 ENCOUNTER — Ambulatory Visit (INDEPENDENT_AMBULATORY_CARE_PROVIDER_SITE_OTHER): Payer: PRIVATE HEALTH INSURANCE | Admitting: Gastroenterology

## 2016-07-13 ENCOUNTER — Encounter: Payer: Self-pay | Admitting: Gastroenterology

## 2016-07-13 VITALS — BP 100/64 | HR 80 | Ht 65.75 in | Wt 147.0 lb

## 2016-07-13 DIAGNOSIS — M329 Systemic lupus erythematosus, unspecified: Secondary | ICD-10-CM

## 2016-07-13 NOTE — Progress Notes (Signed)
Review of pertinent gastrointestinal problems: 1. Abnormal distal ileum CT, abenopathy scan 02/2016, presented with RLQ pain.  Elevated sed rate.  Colonoscopy Dr. Ardis Hughs 03/2016 found normal colon, mildly abnormal terminal ileum (furrowed, congested) but biopsies were completely normal.  Developed e-nodosum like nodules on lower extremities 04/2016 and PCP put her on steroids (66m daily) with fast improvement in the nodules, fatigue.  MRI enterography  04/2016 showed NO inflammation.  04/2016 IBD first step testing "not consistent with IBD."  Referred to rheumatologist: ++SLE   ; DR.  DEstanislado Pandyfeels perhaps acute GI infection caused lupus reaction, unlikely any primary GI disease.     HPI: This is a    very pleasant 45year old woman whom I last saw about 2 months ago.    Chief complaint is lupus  No abd pains,   Her bowels are pretty regular.    No GI bleeding.  ROS: complete GI ROS as described in HPI.  Constitutional:  No unintentional weight loss   Past Medical History:  Diagnosis Date  . ADD (attention deficit disorder)   . Allergy   . Anemia   . Anxiety   . Hyperlipidemia   . Ileitis   . Lupus     Past Surgical History:  Procedure Laterality Date  . CESAREAN SECTION     x 2  . DENTAL SURGERY    . EYE SURGERY Bilateral    cryoretinopexy    Current Outpatient Prescriptions  Medication Sig Dispense Refill  . acetaminophen (TYLENOL) 500 MG tablet Take 1,000 mg by mouth every 6 (six) hours as needed for mild pain.    .Marland Kitchenalbuterol (PROVENTIL HFA;VENTOLIN HFA) 108 (90 BASE) MCG/ACT inhaler Inhale 2 puffs into the lungs every 6 (six) hours as needed for wheezing or shortness of breath. 1 Inhaler 0  . amphetamine-dextroamphetamine (ADDERALL) 10 MG tablet Take 10 mg once a day. May fill 30 days after rx (Patient taking differently: 5 mg. Take 10 mg once a day. May fill 30 days after rx) 30 tablet 0  . Boric Acid POWD Place 600 mg vaginally at bedtime. Insert in the vagina as  directed.  Make up as boric acid vaginal suppository.  qhs x 1 week then twice weekly 14 g 1  . Cetirizine HCl (ZYRTEC ALLERGY PO) Take by mouth.    .Marland KitchenHYDROcodone-acetaminophen (NORCO) 5-325 MG tablet Take 1 tablet by mouth every 6 (six) hours as needed for moderate pain. 20 tablet 0  . hydroxychloroquine (PLAQUENIL) 200 MG tablet Take 200 mg by mouth. 5 days a week    . metoCLOPramide (REGLAN) 5 MG tablet Take 1 tablet (5 mg total) by mouth 4 (four) times daily as needed for nausea or vomiting. 30 tablet 0  . Omega 3-6-9 Fatty Acids (OMEGA-3-6-9 PO) Take 2 capsules by mouth daily.    . predniSONE (DELTASONE) 10 MG tablet Take twice daily for 1 week, then decrease by 5 mg every week until off. 100 tablet 0  . Probiotic Product (PROBIOTIC COMPLEX ACIDOPHILUS PO) Take 1 capsule by mouth daily.     . sodium chloride (OCEAN) 0.65 % nasal spray Place 1 spray into the nose daily as needed for congestion.     . TURMERIC PO Take by mouth 4 (four) times daily.     Current Facility-Administered Medications  Medication Dose Route Frequency Provider Last Rate Last Dose  . 0.9 %  sodium chloride infusion  500 mL Intravenous Continuous DMilus Banister MD       Facility-Administered  Medications Ordered in Other Visits  Medication Dose Route Frequency Provider Last Rate Last Dose  . dimenhyDRINATE 50 MG/ML injection 50 mg  50 mg Intramuscular Once Patrecia Pour, MD        Allergies as of 07/13/2016 - Review Complete 07/13/2016  Allergen Reaction Noted  . Avelox [moxifloxacin hcl in nacl] Other (See Comments) 09/28/2011  . Cefaclor Hives 09/28/2011  . Latex Other (See Comments) 09/28/2011    Family History  Problem Relation Age of Onset  . Heart disease Mother     CAD/stenting  . Hyperlipidemia Mother   . Arthritis Mother   . Hypertension Mother   . Kidney disease Mother   . Irritable bowel syndrome Mother   . Hyperlipidemia Father   . Hyperlipidemia Sister   . Colon cancer Neg Hx   .  Inflammatory bowel disease Neg Hx     Social History   Social History  . Marital status: Married    Spouse name: N/A  . Number of children: N/A  . Years of education: N/A   Occupational History  . Not on file.   Social History Main Topics  . Smoking status: Former Smoker    Quit date: 07/05/2001  . Smokeless tobacco: Never Used  . Alcohol use 8.4 oz/week    14 Cans of beer per week  . Drug use: No  . Sexual activity: Yes    Birth control/ protection: Condom   Other Topics Concern  . Not on file   Social History Narrative   Marital status: married      Children: 2 children (14, 80)      Employment:      Physical Exam: BP 100/64 (BP Location: Left Arm, Patient Position: Sitting, Cuff Size: Normal)   Pulse 80   Ht 5' 5.75" (1.67 m) Comment: height measured without shoes  Wt 147 lb (66.7 kg)   LMP 06/27/2016   BMI 23.91 kg/m  Constitutional: generally well-appearing Psychiatric: alert and oriented x3 Abdomen: soft, nontender, nondistended, no obvious ascites, no peritoneal signs, normal bowel sounds No peripheral edema noted in lower extremities  Assessment and plan: 45 y.o. female with Lupus  We discussed her case. I was never really convinced she had primary underlying Crohn's disease based on a number of factors all summarized above. Certainly she may have had infectious enteritis which caused a lupus-like reaction. I'm grateful for the care of her rheumatologist.  [place] knows to get in touch with me if she has any further questions or concerns or if there is any question of new primary GI problem here. Very interesting.   Owens Loffler, MD [facility] 07/13/2016, 8:45 AM

## 2016-07-13 NOTE — Patient Instructions (Signed)
Follow up as needed

## 2016-07-26 ENCOUNTER — Other Ambulatory Visit: Payer: Self-pay

## 2016-07-26 DIAGNOSIS — R41840 Attention and concentration deficit: Secondary | ICD-10-CM

## 2016-07-26 NOTE — Telephone Encounter (Signed)
Pt is needing a refill on adderall   Best number [phone]

## 2016-07-28 NOTE — Telephone Encounter (Signed)
02/2016 last ov and rx.

## 2016-08-01 MED ORDER — AMPHETAMINE-DEXTROAMPHETAMINE 10 MG PO TABS: ORAL_TABLET | ORAL | 0 refills | Status: DC

## 2016-08-01 NOTE — Telephone Encounter (Signed)
MyChart message sent to patient; refill will be ready for pick up this afternoon after 4:00pm.  She is due for six month follow-up OV in 08/2016; advised her to schedule an appointment.

## 2016-08-02 ENCOUNTER — Other Ambulatory Visit: Payer: Self-pay | Admitting: Family Medicine

## 2016-08-02 DIAGNOSIS — R41840 Attention and concentration deficit: Secondary | ICD-10-CM

## 2016-08-02 MED ORDER — AMPHETAMINE-DEXTROAMPHETAMINE 10 MG PO TABS: ORAL_TABLET | ORAL | 0 refills | Status: DC

## 2016-08-09 ENCOUNTER — Ambulatory Visit (INDEPENDENT_AMBULATORY_CARE_PROVIDER_SITE_OTHER): Payer: PRIVATE HEALTH INSURANCE | Admitting: Physician Assistant

## 2016-08-09 ENCOUNTER — Encounter: Payer: Self-pay | Admitting: Physician Assistant

## 2016-08-09 VITALS — BP 110/70 | HR 60 | Temp 98.1°F | Resp 17 | Ht 65.75 in | Wt 153.0 lb

## 2016-08-09 DIAGNOSIS — K148 Other diseases of tongue: Secondary | ICD-10-CM

## 2016-08-09 DIAGNOSIS — Z760 Encounter for issue of repeat prescription: Secondary | ICD-10-CM

## 2016-08-09 DIAGNOSIS — K140 Glossitis: Secondary | ICD-10-CM | POA: Diagnosis not present

## 2016-08-09 DIAGNOSIS — N939 Abnormal uterine and vaginal bleeding, unspecified: Secondary | ICD-10-CM | POA: Diagnosis not present

## 2016-08-09 LAB — POCT URINALYSIS DIP (MANUAL ENTRY)
Bilirubin, UA: NEGATIVE
Glucose, UA: NEGATIVE
Ketones, POC UA: NEGATIVE
Leukocytes, UA: NEGATIVE
Nitrite, UA: NEGATIVE
Protein Ur, POC: NEGATIVE
Spec Grav, UA: <=1.005
Urobilinogen, UA: 0.2
pH, UA: 6

## 2016-08-09 LAB — POCT CBC
Granulocyte percent: 55.2 %G (ref 37–80)
HCT, POC: 37.8 % (ref 37.7–47.9)
Hemoglobin: 13.3 g/dL (ref 12.2–16.2)
Lymph, poc: 2 (ref 0.6–3.4)
MCH, POC: 31.6 pg — AB (ref 27–31.2)
MCHC: 35.3 g/dL (ref 31.8–35.4)
MCV: 89.6 fL (ref 80–97)
MID (cbc): 0.5 (ref 0–0.9)
MPV: 7.5 fL (ref 0–99.8)
POC Granulocyte: 3.1 (ref 2–6.9)
POC LYMPH PERCENT: 35.9 %L (ref 10–50)
POC MID %: 8.9 %M (ref 0–12)
Platelet Count, POC: 261 10*3/uL (ref 142–424)
RBC: 4.22 M/uL (ref 4.04–5.48)
RDW, POC: 13.8 %
WBC: 5.6 10*3/uL (ref 4.6–10.2)

## 2016-08-09 LAB — HEPATIC FUNCTION PANEL
ALT: 11 U/L (ref 6–29)
AST: 24 U/L (ref 10–30)
Albumin: 4.4 g/dL (ref 3.6–5.1)
Alkaline Phosphatase: 25 U/L — ABNORMAL LOW (ref 33–115)
Bilirubin, Direct: 0.1 mg/dL (ref <=0.2)
Indirect Bilirubin: 0.3 mg/dL (ref 0.2–1.2)
Total Bilirubin: 0.4 mg/dL (ref 0.2–1.2)
Total Protein: 7 g/dL (ref 6.1–8.1)

## 2016-08-09 LAB — POCT URINE PREGNANCY: Preg Test, Ur: NEGATIVE

## 2016-08-09 LAB — TSH: TSH: 2.61 mIU/L

## 2016-08-09 LAB — POCT SKIN KOH: Skin KOH, POC: NEGATIVE

## 2016-08-09 LAB — POCT SEDIMENTATION RATE: POCT SED RATE: 3 mm/hr (ref 0–22)

## 2016-08-09 MED ORDER — NORGESTIMATE-ETH ESTRADIOL 0.25-35 MG-MCG PO TABS: 1.0000 | ORAL_TABLET | Freq: Every day | ORAL | 11 refills | Status: DC

## 2016-08-09 MED ORDER — AMPHETAMINE-DEXTROAMPHETAMINE 10 MG PO TABS: ORAL_TABLET | ORAL | 0 refills | Status: DC

## 2016-08-09 NOTE — Progress Notes (Signed)
08/09/2016 5:28 PM   DOB: Apr 05, 1971 / MRN: [id number]  SUBJECTIVE:  Robin [name] is a 45 y.o. female presenting for adderall refills.  She normally sees Dr. Tamala Julian for this and is okay with going back to her for future refills.   She complains of AUB that started 4 days ago.  Most recent period was on the 12th and normal.  This started again on the 28th. She uses condoms for birth control.  She has been on prednisone in the past and this did cause her to have very heavy periods and her current period feels like she is taking pred again today.  She does not want to undergo a pelvic today as her bleeding is very heavy.  She is not in any pain.   Complains of a rash on her tongue that started about five days ago and is painful. Says she has had this before and it went away on its own. It is not stopping her from po consumption.  She has history of lupus and feels like this may be another manifestation. Denies any blood from the lesion and the lesion does not scrape off with oral care.   She stopped prednisone on November 8th and this at 2.5 mg after she stopped.    She is allergic to avelox [moxifloxacin hcl in nacl]; cefaclor; and latex.   She  has a past medical history of ADD (attention deficit disorder); Allergy; Anemia; Anxiety; Hyperlipidemia; Ileitis; and Lupus.    She  reports that she quit smoking about 15 years ago. She has never used smokeless tobacco. She reports that she drinks about 8.4 oz of alcohol per week . She reports that she does not use drugs. She  reports that she currently engages in sexual activity. She reports using the following method of birth control/protection: Condom. The patient  has a past surgical history that includes Eye surgery (Bilateral); Cesarean section; and Dental surgery.  Her family history includes Arthritis in her mother; Heart disease in her mother; Hyperlipidemia in her father, mother, and sister; Hypertension in her mother; Irritable bowel  syndrome in her mother; Kidney disease in her mother.  Review of Systems  Constitutional: Negative for chills and fever.  HENT: Negative for sore throat.   Cardiovascular: Negative for chest pain.  Gastrointestinal: Negative for nausea.  Musculoskeletal: Negative for myalgias.  Skin: Negative for rash.  Neurological: Negative for dizziness.  Endo/Heme/Allergies: Does not bruise/bleed easily.    The problem list and medications were reviewed and updated by myself where necessary and exist elsewhere in the encounter.   OBJECTIVE:  BP 110/70 (BP Location: Right Arm, Patient Position: Sitting, Cuff Size: Normal)   Pulse 60   Temp 98.1 F (36.7 C) (Oral)   Resp 17   Ht 5' 5.75" (1.67 m)   Wt 153 lb (69.4 kg)   LMP 07/07/2016   SpO2 100%   BMI 24.88 kg/m   Physical Exam  Constitutional: She is oriented to person, place, and time. She appears well-developed and well-nourished. No distress.  HENT:  Head: Normocephalic.  Right Ear: External ear normal.  Left Ear: External ear normal.  Nose: Nose normal.  Mouth/Throat: Oropharynx is clear and moist. No oropharyngeal exudate.  Papulosquamous rash about the proximal central tongue.  Cardiovascular: Normal rate and regular rhythm.   Pulmonary/Chest: Effort normal and breath sounds normal.  Musculoskeletal: Normal range of motion.  Neurological: She is alert and oriented to person, place, and time.  Skin: Skin is warm  and dry. She is not diaphoretic.    Results for orders placed or performed in visit on 08/09/16 (from the past 72 hour(s))  POCT Skin KOH     Status: None   Collection Time: 08/09/16  4:45 PM  Result Value Ref Range   Skin KOH, POC Negative   POCT CBC     Status: Abnormal   Collection Time: 08/09/16  4:50 PM  Result Value Ref Range   WBC 5.6 4.6 - 10.2 K/uL   Lymph, poc 2.0 0.6 - 3.4   POC LYMPH PERCENT 35.9 10 - 50 %L   MID (cbc) 0.5 0 - 0.9   POC MID % 8.9 0 - 12 %M   POC Granulocyte 3.1 2 - 6.9    Granulocyte percent 55.2 37 - 80 %G   RBC 4.22 4.04 - 5.48 M/uL   Hemoglobin 13.3 12.2 - 16.2 g/dL   HCT, POC 37.8 37.7 - 47.9 %   MCV 89.6 80 - 97 fL   MCH, POC 31.6 (A) 27 - 31.2 pg   MCHC 35.3 31.8 - 35.4 g/dL   RDW, POC 13.8 %   Platelet Count, POC 261 142 - 424 K/uL   MPV 7.5 0 - 99.8 fL  POCT urinalysis dipstick     Status: Abnormal   Collection Time: 08/09/16  5:19 PM  Result Value Ref Range   Color, UA yellow yellow   Clarity, UA clear clear   Glucose, UA negative negative   Bilirubin, UA negative negative   Ketones, POC UA negative negative   Spec Grav, UA <=1.005    Blood, UA moderate (A) negative   pH, UA 6.0    Protein Ur, POC negative negative   Urobilinogen, UA 0.2    Nitrite, UA Negative Negative   Leukocytes, UA Negative Negative  POCT urine pregnancy     Status: None   Collection Time: 08/09/16  5:20 PM  Result Value Ref Range   Preg Test, Ur Negative Negative    No results found.  ASSESSMENT AND PLAN  Samanta was seen today for medication refill, menstrual problem, mouth lesions and sore throat.  Diagnoses and all orders for this visit:  Glossitis: MCV normal pointing away from vitamin deficiency. This is likely a lupus manifestation or malignancy.  KOH negative. See problem 4.  -     POCT SEDIMENTATION RATE -     POCT Skin KOH -     TSH  Abnormal uterine bleeding (AUB): She is not pregnant.  I am started her on OCPs.  -     POCT urinalysis dipstick -     POCT urine pregnancy -     POCT CBC -     Hepatic function panel -     TSH  Medication refill: She is taking a low dose.  Will prescribe for 9 days and advised that future refills will need to come for her PCP.        -     norgestimate-ethinyl estradiol (SPRINTEC 28) 0.25-35 MG-MCG tablet; Take 1 tablet              by mouth daily.  Lesion of tongue -     Ambulatory referral to ENT  Disturbed concentration -     amphetamine-dextroamphetamine (ADDERALL) 10 MG tablet; Take 10 mg once a day.  No refills until after February 28th 2018.       The patient is advised to call or return to clinic if she does not  see an improvement in symptoms, or to seek the care of the closest emergency department if she worsens with the above plan.   Philis Fendt, MHS, PA-C [facility] 08/09/2016 5:28 PM

## 2016-08-09 NOTE — Patient Instructions (Signed)
     IF you received an x-ray today, you will receive an invoice from [facility]. Please contact [facility] at [phone] with questions or concerns regarding your invoice.   IF you received labwork today, you will receive an invoice from [facility]. Please contact [facility] at [phone] with questions or concerns regarding your invoice.   Our billing staff will not be able to assist you with questions regarding bills from these companies.  You will be contacted with the lab results as soon as they are available. The fastest way to get your results is to activate your My Chart account. Instructions are located on the last page of this paperwork. If you have not heard from us regarding the results in 2 weeks, please contact this office.      

## 2016-08-10 ENCOUNTER — Telehealth: Payer: Self-pay | Admitting: Rheumatology

## 2016-08-10 DIAGNOSIS — M722 Plantar fascial fibromatosis: Secondary | ICD-10-CM | POA: Insufficient documentation

## 2016-08-10 NOTE — Progress Notes (Signed)
Office Visit Note  Patient: Robin [name]             Date of Birth: September 05, 1971           MRN: [id number]             PCP: Reginia Forts, MD Referring: Ardis Hughs, Mark,M.D. Visit Date: 08/11/2016 Occupation: Sales executive    Subjective:  Oral ulcer   History of Present Illness: Robin [name] is a 45 y.o. female with history of autoimmune disease. She started Plaquenil in September 2017 which she is tolerating well. She was also on prednisone taper which she finished in the beginning of November. She noticed a lesion on her tongue about a week ago. She also felt that she had some irritation in her throat. She was seen by her PCP who did a swab and told her it was negative for yeast infection. She has some joint pain in her hands but no joint swelling. Raynauds persist. No more erythema nodosum lesions were noted. She's been active and exercising.  Activities of Daily Living:  Patient reports morning stiffness for 0 minute.   Patient Denies nocturnal pain.  Difficulty dressing/grooming: Denies Difficulty climbing stairs: Denies Difficulty getting out of chair: Denies Difficulty using hands for taps, buttons, cutlery, and/or writing: Reports   Review of Systems  Constitutional: Positive for fatigue. Negative for night sweats, weight gain, weight loss and weakness.  HENT: Positive for mouth sores and mouth dryness. Negative for trouble swallowing, trouble swallowing and nose dryness.   Eyes: Positive for dryness. Negative for pain, redness and visual disturbance.  Respiratory: Negative for cough, shortness of breath and difficulty breathing.   Cardiovascular: Negative for chest pain, palpitations, hypertension, irregular heartbeat and swelling in legs/feet.  Gastrointestinal: Negative for blood in stool, constipation and diarrhea.  Endocrine: Negative for increased urination.  Genitourinary: Negative for vaginal dryness.  Musculoskeletal: Positive for arthralgias  and joint pain. Negative for joint swelling, myalgias, muscle weakness, morning stiffness, muscle tenderness and myalgias.  Skin: Positive for color change. Negative for rash, hair loss, skin tightness, ulcers and sensitivity to sunlight.  Allergic/Immunologic: Negative for susceptible to infections.  Neurological: Negative for dizziness, memory loss and night sweats.  Hematological: Negative for swollen glands.  Psychiatric/Behavioral: Negative for depressed mood and sleep disturbance. The patient is not nervous/anxious.     PMFS History:  Patient Active Problem List   Diagnosis Date Noted  . History of Bilateral plantar fasciitis 08/10/2016  . Plantar fasciitis 07/04/2016  . Erythema nodosum 07/04/2016  . High risk medication use 07/04/2016  . Lupus 05/23/2016  . 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 RLQ abdominal pain 03/18/2016  . ADHD (attention deficit hyperactivity disorder) 12/22/2014  . Raynaud's phenomenon 10/17/2013  . Hyperlipidemia 09/28/2011    Past Medical History:  Diagnosis Date  . ADD (attention deficit disorder)   . Allergy   . Anemia   . Anxiety   . Hyperlipidemia   . Ileitis   . Lupus     Family History  Problem Relation Age of Onset  . Heart disease Mother     CAD/stenting  . Hyperlipidemia Mother   . Arthritis Mother   . Hypertension Mother   . Kidney disease Mother   . Irritable bowel syndrome Mother   . Stroke Mother   . Hyperlipidemia Father   . Hyperlipidemia Sister   . Colon cancer Neg Hx   . Inflammatory bowel disease Neg Hx    Past Surgical History:  Procedure Laterality  Date  . CESAREAN SECTION     x 2  . DENTAL SURGERY    . EYE SURGERY Bilateral    cryoretinopexy   Social History   Social History Narrative   Marital status: married      Children: 2 children (14, 12)      Employment:      Objective: Vital Signs: BP 95/63 (BP Location: Left Arm, Patient Position: Sitting, Cuff Size: Large)   Pulse 70   Resp 14   Ht 5' 6"   (1.676 m)   Wt 153 lb (69.4 kg)   LMP 07/07/2016   BMI 24.69 kg/m    Physical Exam  Constitutional: She is oriented to person, place, and time. She appears well-developed and well-nourished.  HENT:  Head: Normocephalic and atraumatic.  Tongue is coated. No ulcers were noted.  Eyes: Conjunctivae and EOM are normal.  Neck: Normal range of motion.  1 small palpable cervical lymph node in the left cervical chain. Freely mobile rubbery about half centimeter circular  Cardiovascular: Normal rate, regular rhythm, normal heart sounds and intact distal pulses.   Pulmonary/Chest: Effort normal and breath sounds normal.  Abdominal: Soft. Bowel sounds are normal.  Lymphadenopathy:    She has cervical adenopathy.  Neurological: She is alert and oriented to person, place, and time.  Skin: Skin is warm and dry. Capillary refill takes less than 2 seconds.  Positive Raynauds  Psychiatric: She has a normal mood and affect. Her behavior is normal.  Nursing note and vitals reviewed.    Musculoskeletal Exam: C-spine, thoracic spine, lumbar spine good range of motion. Shoulder joints, elbow joints, wrist joints, MCPs PIPs DIPs with good range of motion with no synovitis hip joints knee joints ankles MTPs PIPs with good range of motion with no synovitis she is mild tenderness across her MCP joints.  CDAI Exam: No CDAI exam completed.    Investigation: Findings:  August 2017 TB gold negative, chest x-ray normal, hepatitis panel negative, G6PD normal, HIV negative, SPEP normal, immunoglobulins normal, CK normal, TSH normal, Ace normal, rheumatoid factor negative, CCP negative, ANA 1:320 speckled, HLA-B27 negative, lupus anticoagulant negative, 05/13/2016 C3-C4 normal, beta 2 negative, anticardiolipin negative, ENA negative    Imaging: No results found.  Speciality Comments: No specialty comments available.    Procedures:  No procedures performed Allergies: Avelox [moxifloxacin hcl in nacl];  Cefaclor; and Latex   Assessment / Plan:     Visit Diagnoses: Systemic lupus erythematosus - Positive ANA 1:320 speckled, history of oral ulcers, photosensitivity, cervical lymphadenopathy, Raynauds, inflammatory arthritis, erythema nodosum : She is doing much better on Plaquenil and she is off prednisone. She possibly had a recent oral ulcers which is resolved. She points to area on her tonsil which looks like a tonsillar crypt. Her tongue is coated and she's been having some burning sensation in her mouth. I'll call in prescription for Magic mouthwash. If her symptoms persist she should see ENT.  High risk medication use - Plaquenil 200 mg twice a day Monday to Friday -I will check labs today and then every 3 months to monitor for drug toxicity. Plan: CBC with Differential/Platelet, COMPLETE METABOLIC PANEL WITH GFR, CBC with Differential/Platelet, COMPLETE METABOLIC PANEL WITH GFR  History of Bilateral plantar fasciitis: Improved.  Her other medical problems are as follows for which she's been seen by the physicians:   History of Ileitis - Followed up by Dr. Ardis Hughs  Anxiety  Attention deficit hyperactivity disorder   History of retinal tear status post  cryotherapy  Allergies  Oral ulcer - Plan: magic mouthwash    Orders: Orders Placed This Encounter  Procedures  . CBC with Differential/Platelet  . COMPLETE METABOLIC PANEL WITH GFR  . CBC with Differential/Platelet  . COMPLETE METABOLIC PANEL WITH GFR   Meds ordered this encounter  Medications  . magic mouthwash    Face-to-face time spent with patient was 30 minutes. 50% of time was spent in counseling and coordination of care.  Follow-Up Instructions: Return in about 3 months (around 11/09/2016) for Autoimmune disease.   Bo Merino, MD

## 2016-08-10 NOTE — Telephone Encounter (Signed)
Discussed with Robin [name], we are working patient in tomorrow.   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  Her right knee joint is still swollen despite taking prednisone 10 mg a day.  She has positive ANA, 1:320 nuclear speckled pattern.  ENA is negative.  She has cervical lymphadenopathy, oral ulcers, photosensitivity, and Raynaud phenomenon.  This puts her in the category of lupus.   I will see response to her Plaquenil therapy.  If it is not sufficient to control her arthritis, it may be worth adding methotrexate later on.

## 2016-08-10 NOTE — Telephone Encounter (Signed)
Patient has a lesion on her tongue and throat; her tongue is also cracked and sore. Her PCP tested her for yeast and it was negative; unable to tell her what it is. Patient would like to know if it could be associated with her Lupus

## 2016-08-11 ENCOUNTER — Encounter: Payer: Self-pay | Admitting: Rheumatology

## 2016-08-11 ENCOUNTER — Ambulatory Visit (INDEPENDENT_AMBULATORY_CARE_PROVIDER_SITE_OTHER): Payer: PRIVATE HEALTH INSURANCE | Admitting: Rheumatology

## 2016-08-11 VITALS — BP 95/63 | HR 70 | Resp 14 | Ht 66.0 in | Wt 153.0 lb

## 2016-08-11 DIAGNOSIS — K529 Noninfective gastroenteritis and colitis, unspecified: Secondary | ICD-10-CM

## 2016-08-11 DIAGNOSIS — Z9109 Other allergy status, other than to drugs and biological substances: Secondary | ICD-10-CM | POA: Diagnosis not present

## 2016-08-11 DIAGNOSIS — H33319 Horseshoe tear of retina without detachment, unspecified eye: Secondary | ICD-10-CM | POA: Diagnosis not present

## 2016-08-11 DIAGNOSIS — K121 Other forms of stomatitis: Secondary | ICD-10-CM

## 2016-08-11 DIAGNOSIS — F419 Anxiety disorder, unspecified: Secondary | ICD-10-CM | POA: Diagnosis not present

## 2016-08-11 DIAGNOSIS — M329 Systemic lupus erythematosus, unspecified: Secondary | ICD-10-CM | POA: Diagnosis not present

## 2016-08-11 DIAGNOSIS — F909 Attention-deficit hyperactivity disorder, unspecified type: Secondary | ICD-10-CM

## 2016-08-11 DIAGNOSIS — M722 Plantar fascial fibromatosis: Secondary | ICD-10-CM | POA: Diagnosis not present

## 2016-08-11 DIAGNOSIS — Z79899 Other long term (current) drug therapy: Secondary | ICD-10-CM

## 2016-08-11 LAB — CBC WITH DIFFERENTIAL/PLATELET
Basophils Absolute: 44 cells/uL (ref 0–200)
Basophils Relative: 1 %
Eosinophils Absolute: 132 cells/uL (ref 15–500)
Eosinophils Relative: 3 %
HCT: 38.4 % (ref 35.0–45.0)
Hemoglobin: 12.4 g/dL (ref 11.7–15.5)
Lymphocytes Relative: 35 %
Lymphs Abs: 1540 cells/uL (ref 850–3900)
MCH: 30.5 pg (ref 27.0–33.0)
MCHC: 32.3 g/dL (ref 32.0–36.0)
MCV: 94.6 fL (ref 80.0–100.0)
MPV: 10 fL (ref 7.5–12.5)
Monocytes Absolute: 440 cells/uL (ref 200–950)
Monocytes Relative: 10 %
Neutro Abs: 2244 cells/uL (ref 1500–7800)
Neutrophils Relative %: 51 %
Platelets: 280 10*3/uL (ref 140–400)
RBC: 4.06 MIL/uL (ref 3.80–5.10)
RDW: 13.9 % (ref 11.0–15.0)
WBC: 4.4 10*3/uL (ref 3.8–10.8)

## 2016-08-11 LAB — COMPLETE METABOLIC PANEL WITH GFR
ALT: 12 U/L (ref 6–29)
AST: 19 U/L (ref 10–30)
Albumin: 4.3 g/dL (ref 3.6–5.1)
Alkaline Phosphatase: 22 U/L — ABNORMAL LOW (ref 33–115)
BUN: 12 mg/dL (ref 7–25)
CO2: 27 mmol/L (ref 20–31)
Calcium: 9.2 mg/dL (ref 8.6–10.2)
Chloride: 104 mmol/L (ref 98–110)
Creat: 0.89 mg/dL (ref 0.50–1.10)
GFR, Est African American: >89 mL/min (ref >=60)
GFR, Est Non African American: 80 mL/min (ref >=60)
Glucose, Bld: 64 mg/dL — ABNORMAL LOW (ref 65–99)
Potassium: 4.3 mmol/L (ref 3.5–5.3)
Sodium: 138 mmol/L (ref 135–146)
Total Bilirubin: 0.4 mg/dL (ref 0.2–1.2)
Total Protein: 6.8 g/dL (ref 6.1–8.1)

## 2016-08-11 MED ORDER — MAGIC MOUTHWASH: 5.0000 mL | Freq: Three times a day (TID) | ORAL | Status: DC

## 2016-08-11 NOTE — Patient Instructions (Signed)
Standing Labs We placed an order today for your standing lab work.    Please come back and get your standing labs in March and every 3 months  We have open lab Monday through Friday from 8:30-11:30 AM and 1-4 PM at the office of Dr. Tresa Moore, PA.   The office is located at [address], [place], [place], [place] [address] No appointment is necessary.   Labs are drawn by [facility].  You may receive a bill from [facility] for your lab work.

## 2016-08-12 NOTE — Progress Notes (Signed)
normal

## 2016-08-16 ENCOUNTER — Telehealth (INDEPENDENT_AMBULATORY_CARE_PROVIDER_SITE_OTHER): Payer: Self-pay | Admitting: Rheumatology

## 2016-08-16 ENCOUNTER — Other Ambulatory Visit: Payer: Self-pay | Admitting: Rheumatology

## 2016-08-16 NOTE — Telephone Encounter (Signed)
Patient states the prescription for the magi mouthwash was not at her pharmacy. She states when was in the office for an appointment on 08/11/16 she was also given the option of Fluconazole instead of the magic mouthwash and would like to know if we can send that to the pharmacy instead.

## 2016-08-16 NOTE — Telephone Encounter (Signed)
Please resend rx, [facility] didn't received on 11/28

## 2016-08-16 NOTE — Telephone Encounter (Signed)
Clotrimazole lozange 22m po 5 times a day x10d

## 2016-08-17 MED ORDER — CLOTRIMAZOLE 10 MG MT TROC: 10.0000 mg | Freq: Every day | OROMUCOSAL | 0 refills | Status: DC

## 2016-08-17 NOTE — Telephone Encounter (Signed)
Last Visit: 08/11/16 Next Visit: 11/09/16 Labs: 08/11/16 WNL  Okay to refill PLQ?

## 2016-08-17 NOTE — Telephone Encounter (Signed)
ok 

## 2016-08-17 NOTE — Telephone Encounter (Signed)
Prescription sent to the pharmacy and patient notified.

## 2016-08-27 ENCOUNTER — Other Ambulatory Visit: Payer: Self-pay

## 2016-08-27 ENCOUNTER — Other Ambulatory Visit: Payer: Self-pay | Admitting: Family Medicine

## 2016-08-27 DIAGNOSIS — R062 Wheezing: Secondary | ICD-10-CM

## 2016-08-27 MED ORDER — ALBUTEROL SULFATE HFA 108 (90 BASE) MCG/ACT IN AERS: 2.0000 | INHALATION_SPRAY | Freq: Four times a day (QID) | RESPIRATORY_TRACT | 0 refills | Status: DC | PRN

## 2016-08-29 ENCOUNTER — Other Ambulatory Visit: Payer: Self-pay | Admitting: Emergency Medicine

## 2016-09-13 ENCOUNTER — Other Ambulatory Visit: Payer: Self-pay | Admitting: Rheumatology

## 2016-09-14 NOTE — Telephone Encounter (Signed)
Last Visit: 08/11/16 Next Visit:11/09/16 Labs: 08/11/16 WNL PLQ Eye Exam: 07/06/16 WNL  Okay to refill PLQ?

## 2016-09-14 NOTE — Telephone Encounter (Signed)
Okay 

## 2016-09-19 ENCOUNTER — Telehealth: Payer: Self-pay

## 2016-09-19 NOTE — Telephone Encounter (Signed)
Patient needs [place] to call her pharmacy and re-authorize her amphetamine-dextroamphetamine (ADDERALL) 10 MG tablet stated that since it was a new year they need [place] to call and say it is ok to get it.  [facility] uses the [facility] on [address].

## 2016-09-20 NOTE — Telephone Encounter (Signed)
Need PA for d-amphetamine salt combo 13m qd  [id number]

## 2016-09-30 NOTE — Telephone Encounter (Signed)
Error, pa in review today  [id number]

## 2016-09-30 NOTE — Telephone Encounter (Incomplete)
Pa approved

## 2016-10-03 ENCOUNTER — Other Ambulatory Visit: Payer: Self-pay

## 2016-10-03 NOTE — Telephone Encounter (Signed)
Add med approved 09/30/17

## 2016-10-09 ENCOUNTER — Other Ambulatory Visit: Payer: Self-pay | Admitting: Rheumatology

## 2016-10-10 NOTE — Telephone Encounter (Signed)
Last Visit: 08/11/16 Next Visit: 11/09/16 Labs: 08/11/16 PLQ Eye Exam: 07/06/16 WNL  Okay to refill PLQ?

## 2016-10-10 NOTE — Telephone Encounter (Signed)
ok 

## 2016-10-11 ENCOUNTER — Ambulatory Visit: Payer: PRIVATE HEALTH INSURANCE | Admitting: Rheumatology

## 2016-10-12 ENCOUNTER — Ambulatory Visit (INDEPENDENT_AMBULATORY_CARE_PROVIDER_SITE_OTHER): Payer: PRIVATE HEALTH INSURANCE | Admitting: Family Medicine

## 2016-10-12 ENCOUNTER — Encounter: Payer: Self-pay | Admitting: Family Medicine

## 2016-10-12 VITALS — BP 102/61 | HR 64 | Temp 98.2°F | Resp 16 | Ht 66.0 in | Wt 150.6 lb

## 2016-10-12 DIAGNOSIS — M3219 Other organ or system involvement in systemic lupus erythematosus: Secondary | ICD-10-CM

## 2016-10-12 DIAGNOSIS — I7300 Raynaud's syndrome without gangrene: Secondary | ICD-10-CM | POA: Diagnosis not present

## 2016-10-12 DIAGNOSIS — Z79899 Other long term (current) drug therapy: Secondary | ICD-10-CM | POA: Diagnosis not present

## 2016-10-12 DIAGNOSIS — L52 Erythema nodosum: Secondary | ICD-10-CM | POA: Diagnosis not present

## 2016-10-12 DIAGNOSIS — Z760 Encounter for issue of repeat prescription: Secondary | ICD-10-CM | POA: Diagnosis not present

## 2016-10-12 DIAGNOSIS — K5000 Crohn's disease of small intestine without complications: Secondary | ICD-10-CM | POA: Diagnosis not present

## 2016-10-12 DIAGNOSIS — F900 Attention-deficit hyperactivity disorder, predominantly inattentive type: Secondary | ICD-10-CM

## 2016-10-12 MED ORDER — AMPHETAMINE-DEXTROAMPHETAMINE 10 MG PO TABS: ORAL_TABLET | ORAL | 0 refills | Status: DC

## 2016-10-12 NOTE — Progress Notes (Signed)
Subjective:    Patient ID: Robin [name], female    DOB: [birth year], 46 y.o.   MRN: [id number]  10/12/2016  Medication Refill (adderrall)   HPI This 46 y.o. female presents for eleven month follow-up of ADD.  Has only been taking half Adderall when maintained on steroids.  Has suffered with anxiety and seeing counselor.  Plaquenil has reduced anxiety some.  Has been going through weird changes at work and not stressed.  Manage with exercise and behavioral techniques.  Has suffered with anxiety chronically.  Now able to exercise.    SLE: diagnosed in the past year; followed by [facility].  Started out with acute infection in ileum in June 2017.  Trying to determine cause of it; seeing GI; concerned about Crohn's disease; no evidence of Crohns.  Met 5/11 criteria for SLE.  Has Raynauds and sun sensitvity, EN, mouth sores and nose sores. No joint involvement; only connective tissue involvement; joints are spared at this time.  Feet and legs.  Erythema nodosum: referred to rheumatology; started on Plaquenil since 05/2016.  Inflammation much improved.  Until mid October, having severe fatigue and fever.  Inflammation spikes were rough.  Able to wean off of steroids.  Immunization History  Administered Date(s) Administered  . Influenza,inj,Quad PF,36+ Mos 05/12/2014, 11/27/2015, 05/23/2016  . Pneumococcal Conjugate-13 05/23/2016  . Tdap 09/28/2011   BP Readings from Last 3 Encounters:  11/04/16 109/73  11/02/16 112/70  10/12/16 102/61   Wt Readings from Last 3 Encounters:  11/04/16 152 lb (68.9 kg)  11/02/16 154 lb 9.6 oz (70.1 kg)  10/12/16 150 lb 9.6 oz (68.3 kg)     Review of Systems  Constitutional: Negative for chills, diaphoresis, fatigue and fever.  Eyes: Negative for visual disturbance.  Respiratory: Negative for cough and shortness of breath.   Cardiovascular: Negative for chest pain, palpitations and leg swelling.  Gastrointestinal: Negative for abdominal pain,  constipation, diarrhea, nausea and vomiting.  Endocrine: Negative for cold intolerance, heat intolerance, polydipsia, polyphagia and polyuria.  Neurological: Negative for dizziness, tremors, seizures, syncope, facial asymmetry, speech difficulty, weakness, light-headedness, numbness and headaches.    Past Medical History:  Diagnosis Date  . ADD (attention deficit disorder)   . Allergy   . Anemia   . Anxiety   . Hyperlipidemia   . Ileitis   . Lupus    Past Surgical History:  Procedure Laterality Date  . CESAREAN SECTION     x 2  . DENTAL SURGERY    . EYE SURGERY Bilateral    cryoretinopexy   Allergies  Allergen Reactions  . Avelox [Moxifloxacin Hcl In Nacl] Other (See Comments)    Muscle cramps  . Cefaclor Hives    Tolerates penicillins  . Latex Other (See Comments)    "Raw skin"  . Meloxicam Rash    Social History   Social History  . Marital status: Married    Spouse name: N/A  . Number of children: N/A  . Years of education: N/A   Occupational History  . Not on file.   Social History Main Topics  . Smoking status: Former Smoker    Quit date: 07/05/2001  . Smokeless tobacco: Never Used  . Alcohol use 8.4 oz/week    14 Cans of beer per week  . Drug use: No  . Sexual activity: Yes    Birth control/ protection: Condom   Other Topics Concern  . Not on file   Social History Narrative   Marital status: married  Children: 2 children (14, 12)      Employment:    Family History  Problem Relation Age of Onset  . Heart disease Mother     CAD/stenting  . Hyperlipidemia Mother   . Arthritis Mother   . Hypertension Mother   . Kidney disease Mother   . Irritable bowel syndrome Mother   . Stroke Mother   . Hyperlipidemia Father   . Hyperlipidemia Sister   . Colon cancer Neg Hx   . Inflammatory bowel disease Neg Hx        Objective:    BP 102/61 (BP Location: Right Arm, Patient Position: Sitting, Cuff Size: Small)   Pulse 64   Temp 98.2 F (36.8  C) (Oral)   Resp 16   Ht 5' 6"  (1.676 m)   Wt 150 lb 9.6 oz (68.3 kg)   SpO2 100%   BMI 24.31 kg/m  Physical Exam  Constitutional: She is oriented to person, place, and time. She appears well-developed and well-nourished. No distress.  HENT:  Head: Normocephalic and atraumatic.  Eyes: Conjunctivae are normal. Pupils are equal, round, and reactive to light.  Neck: Normal range of motion. Neck supple.  Cardiovascular: Normal rate, regular rhythm and normal heart sounds.  Exam reveals no gallop and no friction rub.   No murmur heard. Pulmonary/Chest: Effort normal and breath sounds normal. She has no wheezes. She has no rales.  Neurological: She is alert and oriented to person, place, and time. No cranial nerve deficit. Coordination normal.  Skin: She is not diaphoretic.  Psychiatric: She has a normal mood and affect. Her behavior is normal. Judgment and thought content normal.  Nursing note and vitals reviewed.  Depression screen [facility] 2/9 11/04/2016 11/02/2016 10/12/2016 08/09/2016 04/14/2016  Decreased Interest 0 0 0 0 0  Down, Depressed, Hopeless 0 0 0 0 0  PHQ - 2 Score 0 0 0 0 0        Assessment & Plan:   1. Attention deficit hyperactivity disorder (ADHD), predominantly inattentive type   2. Medication refill   3. Erythema nodosum   4. Raynaud's phenomenon without gangrene   5. Other systemic lupus erythematosus with other organ involvement ([place])   6. 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 High risk medication use    -ADD stable despite major health stressors in the past six months. -reviewed medical events that have occurred in the past six months.  Records reviewed in detail during visit.  -emotionally stable at this time despite stressors. -refill of Adderall provided.   No orders of the defined types were placed in this encounter.  Meds ordered this encounter  Medications  . amphetamine-dextroamphetamine (ADDERALL) 10 MG tablet    Sig: Take 10 mg once a  day to twice daily.    Dispense:  135 tablet    Refill:  0    Return in about 6 months (around 04/11/2017) for recheck ADHD.   Keyonna Comunale Elayne Guerin, M.D. Primary Care at [facility] [address] [place], [place]  [address] [phone] phone [phone] fax

## 2016-10-12 NOTE — Patient Instructions (Signed)
     IF you received an x-ray today, you will receive an invoice from [facility]. Please contact [facility] at [phone] with questions or concerns regarding your invoice.   IF you received labwork today, you will receive an invoice from [facility]. Please contact [facility] at [phone] with questions or concerns regarding your invoice.   Our billing staff will not be able to assist you with questions regarding bills from these companies.  You will be contacted with the lab results as soon as they are available. The fastest way to get your results is to activate your My Chart account. Instructions are located on the last page of this paperwork. If you have not heard from us regarding the results in 2 weeks, please contact this office.     

## 2016-11-01 NOTE — Progress Notes (Signed)
Office Visit Note  Patient: Robin [name]             Date of Birth: 1970-12-27           MRN: [id number]             PCP: Reginia Forts, MD Referring: Wardell Honour, MD Visit Date: 11/09/2016 Occupation: @GUAROCC @    Subjective:  Right shoulder pain   History of Present Illness: Robin [name] is a 46 y.o. female with history of systemic lupus erythematosus. She states she was doing overall quite well as regards to Lupus some. In December 2017 while she was very active and doing exercises she realized that she was having some discomfort in her right shoulder. Over time she started having some discomfort in her right arm and numbness in her right hand. She's also noticed some numbness in her right foot. She states she has some crepitus in her C-spine and some discomfort in the lower part of her C-spine. She has difficulty raising her right shoulder and she feels weak and her right shoulder. She describes numbness in the right fourth and fifth digit. The numbness in her right foot is in the lateral aspect of her foot on the plantar surface. She has some lower back stiffness. Raynolds is more noticeable when exposed to cold temperatures. She has not had any erythema nodosum lesions. There have been no recurrence of plantar fasciitis.  Activities of Daily Living:  Patient reports morning stiffness for0 minute.   Patient Denies nocturnal pain.  Difficulty dressing/grooming: Denies Difficulty climbing stairs: Denies Difficulty getting out of chair: Denies Difficulty using hands for taps, buttons, cutlery, and/or writing: Denies   Review of Systems  Constitutional: Positive for fatigue. Negative for night sweats, weight gain, weight loss and weakness.  HENT: Positive for mouth dryness. Negative for mouth sores, trouble swallowing, trouble swallowing and nose dryness.   Eyes: Negative for pain, redness, visual disturbance and dryness.  Respiratory: Negative for cough, shortness of  breath and difficulty breathing.   Cardiovascular: Negative for chest pain, palpitations, hypertension, irregular heartbeat and swelling in legs/feet.  Gastrointestinal: Negative for blood in stool, constipation and diarrhea.  Endocrine: Negative for increased urination.  Genitourinary: Negative for vaginal dryness.  Musculoskeletal: Negative for arthralgias, joint pain, joint swelling, myalgias, muscle weakness, morning stiffness, muscle tenderness and myalgias.  Skin: Positive for color change. Negative for rash, hair loss, skin tightness, ulcers and sensitivity to sunlight.  Allergic/Immunologic: Negative for susceptible to infections.  Neurological: Negative for dizziness, memory loss and night sweats.  Hematological: Negative for swollen glands.  Psychiatric/Behavioral: Negative for depressed mood and sleep disturbance. The patient is not nervous/anxious.     PMFS History:  Patient Active Problem List   Diagnosis Date Noted  . History of hyperlipidemia 11/08/2016  . History of anxiety 11/08/2016  . History of retinal tear 11/08/2016  . History of Bilateral plantar fasciitis 08/10/2016  . Plantar fasciitis 07/04/2016  . Erythema nodosum 07/04/2016  . High risk medication use 07/04/2016  . Lupus 05/23/2016  . 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[place]) 03/18/2016  . ADHD (attention deficit hyperactivity disorder) 12/22/2014  . Raynaud's phenomenon 10/17/2013  . Hyperlipidemia 09/28/2011    Past Medical History:  Diagnosis Date  . ADD (attention deficit disorder)   . Allergy   . Anemia   . Anxiety   . Hyperlipidemia   . Ileitis   . Lupus     Family History  Problem Relation Age of Onset  . Heart disease Mother  CAD/stenting  . Hyperlipidemia Mother   . Arthritis Mother   . Hypertension Mother   . Kidney disease Mother   . Irritable bowel syndrome Mother   . Stroke Mother   . Hyperlipidemia Father   . Hyperlipidemia Sister   . Colon cancer Neg Hx   . Inflammatory bowel disease Neg  Hx    Past Surgical History:  Procedure Laterality Date  . CESAREAN SECTION     x 2  . DENTAL SURGERY    . EYE SURGERY Bilateral    cryoretinopexy   Social History   Social History Narrative   Marital status: married      Children: 2 children (14, 20)      Employment:      Objective: Vital Signs: BP 120/62   Pulse 76   Resp 14   Wt 154 lb (69.9 kg)   LMP 10/21/2016   BMI 24.48 kg/m    Physical Exam  Constitutional: She is oriented to person, place, and time. She appears well-developed and well-nourished.  HENT:  Head: Normocephalic and atraumatic.  Eyes: Conjunctivae and EOM are normal.  Neck: Normal range of motion.  Cardiovascular: Normal rate, regular rhythm, normal heart sounds and intact distal pulses.   Pulmonary/Chest: Effort normal and breath sounds normal.  Abdominal: Soft. Bowel sounds are normal.  Lymphadenopathy:    She has no cervical adenopathy.  Neurological: She is alert and oriented to person, place, and time.  Skin: Skin is warm and dry. Capillary refill takes 2 to 3 seconds.  Psychiatric: She has a normal mood and affect. Her behavior is normal.  Nursing note and vitals reviewed.    Musculoskeletal Exam: Patient has some discomfort with the right lateral rotation of her C-spine. Thoracic lumbar spine good range of motion. She is some crepitus with range of motion of her right shoulder consistent with tendinopathy. Elbow joints wrist joint MCPs PIPs DIPs are good range of motion with no synovitis. Hip joints knee joints ankles MTPs PIPs with good range of motion with no synovitis. She has no plantar fasciitis today  CDAI Exam: CDAI Homunculus Exam:   Joint Counts:  CDAI Tender Joint count: 0 CDAI Swollen Joint count: 0  Global Assessments:  Patient Global Assessment: 4 Provider Global Assessment: 4  CDAI Calculated Score: 8    Investigation: Findings:  August 2017 TB gold negative, chest x-ray normal, hepatitis panel negative, G6PD  normal, HIV negative, SPEP normal, immunoglobulins normal, CK normal, TSH normal, Ace normal, rheumatoid factor negative, CCP negative, ANA 1:320 speckled, HLA-B27 negative, lupus anticoagulant negative, 05/13/2016 C3-C4 normal, beta 2 negative, anticardiolipin negative, ENA negative November 2017 CBC normal, CMP normal    Imaging: Xr Cervical Spine 2 Or 3 Views  Result Date: 11/09/2016 No significant disc space narrowing was noted no spurring was noted. Impression C-spine within normal limits with minimal facet joint arthropathy   Speciality Comments: No specialty comments available.    Procedures:  No procedures performed Allergies: Avelox [moxifloxacin hcl in nacl]; Cefaclor; Latex; Meloxicam; and Naproxen   Assessment / Plan:     Visit Diagnoses:  systemic lupus erythematosus with other organ involvement (HCC) - ANA 1:320 speckled, history of oral ulcers, lymphadenopathy, photosensitivity, Raynauds, erythema nodosum, inflammatory arthritis. She is doing really well on Plaquenil , she has mild Raynauds. No synovitis on examination today.  Raynaud's phenomenon without gangrene: Advised warm clothing and aspirin 81 mg by mouth daily  High risk medication use - Plaquenil 200 mg twice a day Monday to  Friday. Her eye exams are up-to-date.  Pain, neck - Plan: XR Cervical Spine 2 or 3 views. X-ray was unremarkable I'll refer her to physical therapy  Chronic right shoulder pain: She's been having discomfort in her right shoulder since she's been exercising. Her findings are consistent with  tendinopathy. I'll refer her to physical therapy  Erythema nodosum: No recurrence of lesions  History of Bilateral plantar fasciitis: Plantar fasciitis improved.  History of hyperlipidemia: Association of heart disease with some lupus was discussed. Monitoring good blood pressure, cholesterol and regular exercise was emphasized.   Her other medical problems are as follows:  Terminal ileitis  without complication ([place])  Attention deficit hyperactivity disorder (ADHD), predominantly inattentive type  History of anxiety  History of retinal tear - Status post cryotherapy    Orders: Orders Placed This Encounter  Procedures  . XR Cervical Spine 2 or 3 views  . CBC with Differential/Platelet  . COMPLETE METABOLIC PANEL WITH GFR  . Urinalysis, Routine w reflex microscopic  . Sedimentation rate  . C3 and C4  . Anti-DNA antibody, double-stranded  . Ambulatory referral to Physical Therapy   No orders of the defined types were placed in this encounter.   Face-to-face time spent with patient was 30 minutes. 50% of time was spent in counseling and coordination of care.  Follow-Up Instructions: Return in about 5 months (around 04/08/2017) for Systemic lupus.   Bo Merino, MD  Note - This record has been created using Editor, commissioning.  Chart creation errors have been sought, but may not always  have been located. Such creation errors do not reflect on  the standard of medical care.

## 2016-11-02 ENCOUNTER — Ambulatory Visit (INDEPENDENT_AMBULATORY_CARE_PROVIDER_SITE_OTHER): Payer: Managed Care, Other (non HMO) | Admitting: Physician Assistant

## 2016-11-02 VITALS — BP 112/70 | HR 87 | Temp 98.3°F | Resp 16 | Ht 66.5 in | Wt 154.6 lb

## 2016-11-02 DIAGNOSIS — M25511 Pain in right shoulder: Secondary | ICD-10-CM | POA: Diagnosis not present

## 2016-11-02 MED ORDER — CYCLOBENZAPRINE HCL 10 MG PO TABS: 10.0000 mg | ORAL_TABLET | Freq: Three times a day (TID) | ORAL | 0 refills | Status: DC | PRN

## 2016-11-02 MED ORDER — MELOXICAM 7.5 MG PO TABS: 7.5000 mg | ORAL_TABLET | Freq: Every day | ORAL | 0 refills | Status: DC

## 2016-11-02 NOTE — Patient Instructions (Addendum)
Please come back in 3-4 weeks if you are not better.  May take 1-2 Mobic/day. Flexeril at night.   Thank you for coming in today. I hope you feel we met your needs.  Feel free to call [facility] if you have any questions or further requests.  Please consider signing up for MyChart if you do not already have it, as this is a great way to communicate with me.  Best,  Whitney McVey, PA-C  IF you received an x-ray today, you will receive an invoice from [facility]. Please contact [facility] at [phone] with questions or concerns regarding your invoice.   IF you received labwork today, you will receive an invoice from [facility]. Please contact [facility] at [phone] with questions or concerns regarding your invoice.   Our billing staff will not be able to assist you with questions regarding bills from these companies.  You will be contacted with the lab results as soon as they are available. The fastest way to get your results is to activate your My Chart account. Instructions are located on the last page of this paperwork. If you have not heard from [place] regarding the results in 2 weeks, please contact this office.

## 2016-11-02 NOTE — Progress Notes (Signed)
Robin [name]  MRN: [id number] DOB: 10-21-1970  PCP: Reginia Forts, MD  Subjective:  Pt is a 46 year old right-handed female who presents to clinic for right shoulder pain x 2 months.  Pain wakes her up at night. She slept on her right side. Tried to train herself to sleep on left side, however the weight of the blanket was uncomfortable on her right shoulder.  "pops and cracks a lot" +numbness in R hand. +pain, weakness when lifting. Described her pain as burning. Every day. Comes and goes. Worse with activities when she uses her arm.  Aches from side of shoulder to the right-side of her neck.  No MOI. No h/o shoulder injury.  Denies reduced ROM.  H/o lupus. She had her first lupus flare last fall. After that subsided, she went back to the gym.   Review of Systems  Respiratory: Negative for cough, chest tightness, shortness of breath and wheezing.   Cardiovascular: Negative for chest pain and palpitations.  Musculoskeletal: Positive for arthralgias (R shoulder) and neck pain. Negative for back pain, joint swelling and neck stiffness.  Neurological: Positive for weakness and numbness.  Psychiatric/Behavioral: Positive for sleep disturbance.    Patient Active Problem List   Diagnosis Date Noted  . History of Bilateral plantar fasciitis 08/10/2016  . Plantar fasciitis 07/04/2016  . Erythema nodosum 07/04/2016  . High risk medication use 07/04/2016  . Lupus 05/23/2016  . 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[place]) 03/18/2016  . RLQ abdominal pain 03/18/2016  . ADHD (attention deficit hyperactivity disorder) 12/22/2014  . Raynaud's phenomenon 10/17/2013  . Hyperlipidemia 09/28/2011    Current Outpatient Prescriptions on File Prior to Visit  Medication Sig Dispense Refill  . acetaminophen (TYLENOL) 500 MG tablet Take 1,000 mg by mouth every 6 (six) hours as needed for mild pain.    Marland Kitchen albuterol (PROVENTIL HFA;VENTOLIN HFA) 108 (90 Base) MCG/ACT inhaler Inhale 2 puffs into the lungs every 6 (six)  hours as needed for wheezing or shortness of breath. 1 Inhaler 0  . amphetamine-dextroamphetamine (ADDERALL) 10 MG tablet Take 10 mg once a day to twice daily. 135 tablet 0  . Boric Acid Topical POWD Place vaginally.    . Cetirizine HCl (ZYRTEC ALLERGY PO) Take by mouth.    . hydroxychloroquine (PLAQUENIL) 200 MG tablet TAKE 1 TABLET BY MOUTH TWICE DAILY( EVERY TWELVE HOURS) MONDAY- FRIDAY 40 tablet 0  . metoCLOPramide (REGLAN) 5 MG tablet Take 1 tablet (5 mg total) by mouth 4 (four) times daily as needed for nausea or vomiting. 30 tablet 0  . Omega 3-6-9 Fatty Acids (OMEGA-3-6-9 PO) Take 2 capsules by mouth daily.    . Probiotic Product (PROBIOTIC COMPLEX ACIDOPHILUS PO) Take 1 capsule by mouth daily.     . sodium chloride (OCEAN) 0.65 % nasal spray Place 1 spray into the nose daily as needed for congestion.     . TURMERIC PO Take by mouth 4 (four) times daily.    . VENTOLIN HFA 108 (90 Base) MCG/ACT inhaler INHALE 2 PUFFS INTO THE LUNGS EVERY 6 HOURS AS NEEDED FOR WHEEZING OR SHORTNESS OF BREATH 18 g 0  . HYDROcodone-acetaminophen (NORCO/VICODIN) 5-325 MG tablet Take by mouth.     Current Facility-Administered Medications on File Prior to Visit  Medication Dose Route Frequency Provider Last Rate Last Dose  . magic mouthwash  5 mL Oral TID Bo Merino, MD        Allergies  Allergen Reactions  . Avelox [Moxifloxacin Hcl In Nacl] Other (See Comments)  Muscle cramps  . Cefaclor Hives    Tolerates penicillins  . Latex Other (See Comments)    "Raw skin"     Objective:  BP 112/70   Pulse 87   Temp 98.3 F (36.8 C) (Oral)   Resp 16   Ht 5' 6.5" (1.689 m)   Wt 154 lb 9.6 oz (70.1 kg)   LMP 10/21/2016   SpO2 100%   BMI 24.58 kg/m   Physical Exam  Constitutional: She is oriented to person, place, and time and well-developed, well-nourished, and in no distress. No distress.  Cardiovascular: Normal rate, regular rhythm and normal heart sounds.   Musculoskeletal:       Right  shoulder: She exhibits normal range of motion, no bony tenderness, no deformity, no spasm, normal pulse and normal strength.  Neurological: She is alert and oriented to person, place, and time. GCS score is 15.  Skin: Skin is warm and dry.  Psychiatric: Mood, memory, affect and judgment normal.  Vitals reviewed.   Assessment and Plan :  1. Pain in joint of right shoulder - cyclobenzaprine (FLEXERIL) 10 MG tablet; Take 1 tablet (10 mg total) by mouth 3 (three) times daily as needed for muscle spasms. (Patient taking differently: Take 10 mg by mouth at bedtime. Prn)  Dispense: 30 tablet; Refill: 0 - RTC if no improvement.    Mercer Pod, PA-C  Primary Care at [facility] 11/02/2016 9:38 AM

## 2016-11-03 ENCOUNTER — Other Ambulatory Visit: Payer: Self-pay | Admitting: Rheumatology

## 2016-11-04 ENCOUNTER — Ambulatory Visit (INDEPENDENT_AMBULATORY_CARE_PROVIDER_SITE_OTHER): Payer: Managed Care, Other (non HMO) | Admitting: Physician Assistant

## 2016-11-04 ENCOUNTER — Telehealth: Payer: Self-pay

## 2016-11-04 VITALS — BP 109/73 | HR 69 | Temp 98.1°F | Resp 16 | Ht 66.5 in | Wt 152.0 lb

## 2016-11-04 DIAGNOSIS — T7840XA Allergy, unspecified, initial encounter: Secondary | ICD-10-CM

## 2016-11-04 DIAGNOSIS — M25511 Pain in right shoulder: Secondary | ICD-10-CM | POA: Diagnosis not present

## 2016-11-04 DIAGNOSIS — R21 Rash and other nonspecific skin eruption: Secondary | ICD-10-CM | POA: Diagnosis not present

## 2016-11-04 MED ORDER — NAPROXEN 250 MG PO TABS: 250.0000 mg | ORAL_TABLET | Freq: Two times a day (BID) | ORAL | 0 refills | Status: DC

## 2016-11-04 NOTE — Patient Instructions (Signed)
     IF you received an x-ray today, you will receive an invoice from [facility]. Please contact [facility] at [phone] with questions or concerns regarding your invoice.   IF you received labwork today, you will receive an invoice from [facility]. Please contact [facility] at [phone] with questions or concerns regarding your invoice.   Our billing staff will not be able to assist you with questions regarding bills from these companies.  You will be contacted with the lab results as soon as they are available. The fastest way to get your results is to activate your My Chart account. Instructions are located on the last page of this paperwork. If you have not heard from us regarding the results in 2 weeks, please contact this office.     

## 2016-11-04 NOTE — Telephone Encounter (Signed)
Pt was prescribed meloxicam and cyclobenzaprine. She developed a rash and believes it is the meloxicam. She says it feels itchy and when she scratches it feels like broken blood vessels. She would like to know if she should stop taking these medications or what to do next. Best callback number is [phone].

## 2016-11-04 NOTE — Progress Notes (Signed)
Robin [name]  MRN: [id number] DOB: Dec 31, 1970  PCP: Reginia Forts, MD  Subjective:  Pt is a 46 year old female who presents to clinic for possible allergic reaction. She was here two days ago for shoulder pain. Rx Meloxicam and Flexeril. She is taking 69m Flexeril at night before bed. Tolerating this medication well, helps her sleep.  She took Meloxicam yesterday afternoon, developed rash on b/l arms, chest and back several hours later +itching.  Denies SOB, wheezing, difficulty breathing, abdominal pain, nausea, vomiting.  She has taken ibuprofen in the past, no rash.   Review of Systems  Respiratory: Negative for cough, shortness of breath and wheezing.   Cardiovascular: Negative for chest pain and palpitations.  Gastrointestinal: Negative for abdominal pain, nausea and vomiting.  Skin: Positive for rash.    Patient Active Problem List   Diagnosis Date Noted  . History of Bilateral plantar fasciitis 08/10/2016  . Plantar fasciitis 07/04/2016  . Erythema nodosum 07/04/2016  . High risk medication use 07/04/2016  . Lupus 05/23/2016  . 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[place] 03/18/2016  . RLQ abdominal pain 03/18/2016  . ADHD (attention deficit hyperactivity disorder) 12/22/2014  . Raynaud's phenomenon 10/17/2013  . Hyperlipidemia 09/28/2011    Current Outpatient Prescriptions on File Prior to Visit  Medication Sig Dispense Refill  . acetaminophen (TYLENOL) 500 MG tablet Take 1,000 mg by mouth every 6 (six) hours as needed for mild pain.    .Marland Kitchenalbuterol (PROVENTIL HFA;VENTOLIN HFA) 108 (90 Base) MCG/ACT inhaler Inhale 2 puffs into the lungs every 6 (six) hours as needed for wheezing or shortness of breath. 1 Inhaler 0  . amphetamine-dextroamphetamine (ADDERALL) 10 MG tablet Take 10 mg once a day to twice daily. 135 tablet 0  . Boric Acid Topical POWD Place vaginally.    . Cetirizine HCl (ZYRTEC ALLERGY PO) Take by mouth.    .Marland KitchenHYDROcodone-acetaminophen (NORCO/VICODIN) 5-325 MG tablet Take  by mouth.    . hydroxychloroquine (PLAQUENIL) 200 MG tablet TAKE 1 TABLET BY MOUTH TWICE DAILY( EVERY TWELVE HOURS) MONDAY- FRIDAY 40 tablet 2  . metoCLOPramide (REGLAN) 5 MG tablet Take 1 tablet (5 mg total) by mouth 4 (four) times daily as needed for nausea or vomiting. 30 tablet 0  . Omega 3-6-9 Fatty Acids (OMEGA-3-6-9 PO) Take 2 capsules by mouth daily.    . Probiotic Product (PROBIOTIC COMPLEX ACIDOPHILUS PO) Take 1 capsule by mouth daily.     . sodium chloride (OCEAN) 0.65 % nasal spray Place 1 spray into the nose daily as needed for congestion.     . TURMERIC PO Take by mouth 4 (four) times daily.    . VENTOLIN HFA 108 (90 Base) MCG/ACT inhaler INHALE 2 PUFFS INTO THE LUNGS EVERY 6 HOURS AS NEEDED FOR WHEEZING OR SHORTNESS OF BREATH 18 g 0  . cyclobenzaprine (FLEXERIL) 10 MG tablet Take 1 tablet (10 mg total) by mouth 3 (three) times daily as needed for muscle spasms. (Patient not taking: Reported on 11/04/2016) 30 tablet 0  . meloxicam (MOBIC) 7.5 MG tablet Take 1 tablet (7.5 mg total) by mouth daily. May take 2 if needed. (Patient not taking: Reported on 11/04/2016) 60 tablet 0   Current Facility-Administered Medications on File Prior to Visit  Medication Dose Route Frequency Provider Last Rate Last Dose  . magic mouthwash  5 mL Oral TID SBo Merino MD        Allergies  Allergen Reactions  . Avelox [Moxifloxacin Hcl In Nacl] Other (See Comments)    Muscle cramps  .  Cefaclor Hives    Tolerates penicillins  . Latex Other (See Comments)    "Raw skin"     Objective:  BP 109/73   Pulse 69   Temp 98.1 F (36.7 C) (Oral)   Resp 16   Ht 5' 6.5" (1.689 m)   Wt 152 lb (68.9 kg)   LMP 10/21/2016   SpO2 97%   BMI 24.17 kg/m   Physical Exam  Constitutional: She is oriented to person, place, and time and well-developed, well-nourished, and in no distress. No distress.  Cardiovascular: Normal rate, regular rhythm and normal heart sounds.   Pulmonary/Chest: Effort normal and  breath sounds normal. No respiratory distress.  Neurological: She is alert and oriented to person, place, and time. GCS score is 15.  Skin: Skin is warm and dry. Rash noted. Rash is urticarial (b/l forearms, chest).  Psychiatric: Mood, memory, affect and judgment normal.  Vitals reviewed.   Assessment and Plan :  1. Allergic reaction, initial encounter 2. Rash and nonspecific skin eruption 3. Pain in joint of right shoulder - naproxen (NAPROSYN) 250 MG tablet; Take 1 tablet (250 mg total) by mouth 2 (two) times daily with a meal. May increase 549m BID  Dispense: 30 tablet; Refill: 0 - Advised Benadryl for rash. Pt has had NSAIDs in the past with no reaction. Will change therapy to Naproxen. No refills. If pt pain is not improved after 3-4 weeks, consider referral to ortho or PT.  WMercer Pod PA-C  [facility]/23/2018 12:00 PM

## 2016-11-04 NOTE — Telephone Encounter (Signed)
Patient advised to stop medications. Start benadryl for possible allergic reaction and rtc for an evaluation.

## 2016-11-04 NOTE — Telephone Encounter (Signed)
Last Visit: 08/11/16 Next Visit: 11/09/16 Labs: 08/11/16 PLQ Eye Exam: 07/06/16 WNL  Okay to refill PLQ?

## 2016-11-04 NOTE — Telephone Encounter (Signed)
ok 

## 2016-11-08 ENCOUNTER — Encounter: Payer: Self-pay | Admitting: Physician Assistant

## 2016-11-08 DIAGNOSIS — Z8639 Personal history of other endocrine, nutritional and metabolic disease: Secondary | ICD-10-CM | POA: Insufficient documentation

## 2016-11-08 DIAGNOSIS — Z8669 Personal history of other diseases of the nervous system and sense organs: Secondary | ICD-10-CM | POA: Insufficient documentation

## 2016-11-08 DIAGNOSIS — Z8659 Personal history of other mental and behavioral disorders: Secondary | ICD-10-CM | POA: Insufficient documentation

## 2016-11-09 ENCOUNTER — Ambulatory Visit (INDEPENDENT_AMBULATORY_CARE_PROVIDER_SITE_OTHER): Payer: Self-pay

## 2016-11-09 ENCOUNTER — Ambulatory Visit (INDEPENDENT_AMBULATORY_CARE_PROVIDER_SITE_OTHER): Payer: Managed Care, Other (non HMO) | Admitting: Rheumatology

## 2016-11-09 ENCOUNTER — Encounter: Payer: Self-pay | Admitting: Rheumatology

## 2016-11-09 VITALS — BP 120/62 | HR 76 | Resp 14 | Wt 154.0 lb

## 2016-11-09 DIAGNOSIS — Z8639 Personal history of other endocrine, nutritional and metabolic disease: Secondary | ICD-10-CM

## 2016-11-09 DIAGNOSIS — L52 Erythema nodosum: Secondary | ICD-10-CM

## 2016-11-09 DIAGNOSIS — Z79899 Other long term (current) drug therapy: Secondary | ICD-10-CM

## 2016-11-09 DIAGNOSIS — M25511 Pain in right shoulder: Secondary | ICD-10-CM

## 2016-11-09 DIAGNOSIS — Z8659 Personal history of other mental and behavioral disorders: Secondary | ICD-10-CM

## 2016-11-09 DIAGNOSIS — I7300 Raynaud's syndrome without gangrene: Secondary | ICD-10-CM | POA: Diagnosis not present

## 2016-11-09 DIAGNOSIS — K5000 Crohn's disease of small intestine without complications: Secondary | ICD-10-CM | POA: Diagnosis not present

## 2016-11-09 DIAGNOSIS — Z8669 Personal history of other diseases of the nervous system and sense organs: Secondary | ICD-10-CM

## 2016-11-09 DIAGNOSIS — M542 Cervicalgia: Secondary | ICD-10-CM | POA: Diagnosis not present

## 2016-11-09 DIAGNOSIS — M3219 Other organ or system involvement in systemic lupus erythematosus: Secondary | ICD-10-CM | POA: Diagnosis not present

## 2016-11-09 DIAGNOSIS — M722 Plantar fascial fibromatosis: Secondary | ICD-10-CM

## 2016-11-09 DIAGNOSIS — F900 Attention-deficit hyperactivity disorder, predominantly inattentive type: Secondary | ICD-10-CM

## 2016-11-09 DIAGNOSIS — G8929 Other chronic pain: Secondary | ICD-10-CM

## 2016-11-09 LAB — COMPLETE METABOLIC PANEL WITH GFR
ALT: 10 U/L (ref 6–29)
AST: 17 U/L (ref 10–30)
Albumin: 4.5 g/dL (ref 3.6–5.1)
Alkaline Phosphatase: 23 U/L — ABNORMAL LOW (ref 33–115)
BUN: 13 mg/dL (ref 7–25)
CO2: 24 mmol/L (ref 20–31)
Calcium: 9.4 mg/dL (ref 8.6–10.2)
Chloride: 106 mmol/L (ref 98–110)
Creat: 0.86 mg/dL (ref 0.50–1.10)
GFR, Est African American: >89 mL/min (ref >=60)
GFR, Est Non African American: 83 mL/min (ref >=60)
Glucose, Bld: 77 mg/dL (ref 65–99)
Potassium: 4.4 mmol/L (ref 3.5–5.3)
Sodium: 138 mmol/L (ref 135–146)
Total Bilirubin: 0.7 mg/dL (ref 0.2–1.2)
Total Protein: 7 g/dL (ref 6.1–8.1)

## 2016-11-09 LAB — CBC WITH DIFFERENTIAL/PLATELET
Basophils Absolute: 0 cells/uL (ref 0–200)
Basophils Relative: 0 %
Eosinophils Absolute: 84 cells/uL (ref 15–500)
Eosinophils Relative: 2 %
HCT: 40.1 % (ref 35.0–45.0)
Hemoglobin: 13.2 g/dL (ref 11.7–15.5)
Lymphocytes Relative: 26 %
Lymphs Abs: 1092 cells/uL (ref 850–3900)
MCH: 30.4 pg (ref 27.0–33.0)
MCHC: 32.9 g/dL (ref 32.0–36.0)
MCV: 92.4 fL (ref 80.0–100.0)
MPV: 9.7 fL (ref 7.5–12.5)
Monocytes Absolute: 504 cells/uL (ref 200–950)
Monocytes Relative: 12 %
Neutro Abs: 2520 cells/uL (ref 1500–7800)
Neutrophils Relative %: 60 %
Platelets: 222 10*3/uL (ref 140–400)
RBC: 4.34 MIL/uL (ref 3.80–5.10)
RDW: 13.7 % (ref 11.0–15.0)
WBC: 4.2 10*3/uL (ref 3.8–10.8)

## 2016-11-09 NOTE — Progress Notes (Signed)
Rheumatology Medication Review by a Pharmacist Does the patient feel that his/her medications are working for him/her?  Yes Has the patient been experiencing any side effects to the medications prescribed?  No Does the patient have any problems obtaining medications?  No  Issues to address at subsequent visits: None   Pharmacist comments:  Timira is a pleasant 46 yo F who presents for follow up.  She is currently taking hydroxychloroquine 200 mg BID Monday through Friday.  Patient had standing labs on 08/11/16 which were normal.  She is due for standing labs again today.  Her most recent hydroxychloroquine eye exam was on 10/04/16 which was normal.  Patient denies any questions or concerns regarding her medications at this time.    Elisabeth Most, Pharm.D., BCPS, CPP Clinical Pharmacist Pager: [phone] Phone: [phone] 11/09/2016 9:37 AM

## 2016-11-09 NOTE — Patient Instructions (Signed)
Standing Labs We placed an order today for your standing lab work.    Please come back and get your standing labs in June 2018 and every 4 months.  We have open lab Monday through Friday from 8:30-11:30 AM and 1:30-4 PM at the office of Dr. Tresa Moore, PA.   The office is located at [address] [place], [place], [place] [address] No appointment is necessary.   Labs are drawn by [facility].  You may receive a bill from [facility] for your lab work.

## 2016-11-10 LAB — URINALYSIS, ROUTINE W REFLEX MICROSCOPIC
Bilirubin Urine: NEGATIVE
Glucose, UA: NEGATIVE
Hgb urine dipstick: NEGATIVE
Ketones, ur: NEGATIVE
Leukocytes, UA: NEGATIVE
Nitrite: NEGATIVE
Protein, ur: NEGATIVE
Specific Gravity, Urine: 1.007 (ref 1.001–1.035)
pH: 6 (ref 5.0–8.0)

## 2016-11-10 LAB — C3 AND C4
C3 Complement: 83 mg/dL (ref 83–193)
C4 Complement: 19 mg/dL (ref 15–57)

## 2016-11-10 LAB — ANTI-DNA ANTIBODY, DOUBLE-STRANDED: ds DNA Ab: <1 IU/mL

## 2016-11-10 LAB — SEDIMENTATION RATE: Sed Rate: 1 mm/hr (ref 0–20)

## 2016-11-10 NOTE — Progress Notes (Signed)
Labs normal.

## 2016-11-16 NOTE — Telephone Encounter (Signed)
Hydrocodone script destroyed  Pt did not pick up 04/14/16

## 2016-11-25 ENCOUNTER — Other Ambulatory Visit: Payer: Self-pay | Admitting: Physician Assistant

## 2016-11-25 DIAGNOSIS — M25511 Pain in right shoulder: Secondary | ICD-10-CM

## 2016-11-25 NOTE — Telephone Encounter (Signed)
Refill or re-evaluate?

## 2016-11-29 NOTE — Telephone Encounter (Signed)
Please call pt regarding Meloxicam. Last time she took it it gave her a rash. Does she really want this?  Is this for her shoulder pain? Thank you!

## 2016-11-30 ENCOUNTER — Telehealth: Payer: Self-pay | Admitting: Family Medicine

## 2016-11-30 NOTE — Telephone Encounter (Signed)
PT CALLING STATING THAT SOMEONE CALLED HER TO SEE IF SHE WANTED A REFILL ON HER MELOXICAM AND SHE WANTED [place] TO KNOW THAT SHE DOESN'T NEED THAT REFILLED

## 2016-11-30 NOTE — Telephone Encounter (Signed)
Went to voicemail, left message to contact if she would like the refill of the meloxicam.  This caused rash, apparently, and may have worsening SE if taken again.  She will need to contact for this, but at this time the refill will be denied.

## 2016-12-05 ENCOUNTER — Ambulatory Visit: Payer: BLUE CROSS/BLUE SHIELD | Attending: Family Medicine | Admitting: Physical Therapy

## 2016-12-05 ENCOUNTER — Encounter: Payer: Self-pay | Admitting: Physical Therapy

## 2016-12-05 DIAGNOSIS — M25511 Pain in right shoulder: Secondary | ICD-10-CM | POA: Insufficient documentation

## 2016-12-05 DIAGNOSIS — G8929 Other chronic pain: Secondary | ICD-10-CM

## 2016-12-05 DIAGNOSIS — M542 Cervicalgia: Secondary | ICD-10-CM | POA: Insufficient documentation

## 2016-12-05 DIAGNOSIS — M6281 Muscle weakness (generalized): Secondary | ICD-10-CM | POA: Insufficient documentation

## 2016-12-05 NOTE — Therapy (Signed)
[place], [place], [address] Phone: [phone]   Fax:  [phone]  Physical Therapy Evaluation  Patient Details  Name: Robin [name] MRN: [id number] Date of Birth: [birth year] Referring Provider: Bo Merino, MD  Encounter Date: 12/05/2016      PT End of Session - 12/05/16 0851    Visit Number 1   Number of Visits 9   Date for PT Re-Evaluation 01/06/17   Authorization Type [employer]- no visit limit   PT Start Time 0851   PT Stop Time 0933   PT Time Calculation (min) 42 min   Activity Tolerance Patient tolerated treatment well   Behavior During Therapy [facility] for tasks assessed/performed      Past Medical History:  Diagnosis Date  . ADD (attention deficit disorder)   . Allergy   . Anemia   . Anxiety   . Hyperlipidemia   . Ileitis   . Lupus     Past Surgical History:  Procedure Laterality Date  . CESAREAN SECTION     x 2  . DENTAL SURGERY    . EYE SURGERY Bilateral    cryoretinopexy    There were no vitals filed for this visit.       Subjective Assessment - 12/05/16 0854    Subjective Off and on for a while, began over Christmas break. Pain increased through Feb and has been seeing a massage therapist which has been helpful. Had to restrict activities with lupus flare up last year, began again in November. Began doing a cross training video, did not notice pain while exercising until after. Difficulty sleeping-N/T. Feels frequent crepitus.    Patient Stated Goals lifting overhead-in kitchen, sleep, doing dishes/cleaning   Currently in Pain? Yes   Pain Score 2   up to 4/10   Pain Location Shoulder   Pain Orientation Right   Pain Descriptors / Indicators Burning  crepitus   Aggravating Factors  arm hanging at side, laying on side, sitting with arm on arm rest    Pain Relieving Factors ibuprofen            [facility] PT Assessment - 12/05/16 0001      Assessment   Medical  Diagnosis cervical spine and R shoulder tendonitis   Referring Provider Bo Merino, MD   Onset Date/Surgical Date --  November 2017   Hand Dominance Right   Prior Therapy massage therapy     Precautions   Precaution Comments systemic lupus flare ups     Restrictions   Weight Bearing Restrictions No     Balance Screen   Has the patient fallen in the past 6 months No     [place] residence     Prior Function   Level of Independence Independent   Vocation Full time employment   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     Cognition   Overall Cognitive Status Within Functional Limits for tasks assessed     Observation/Other Assessments   Focus on Therapeutic Outcomes (FOTO)  56% ability     Sensation   Additional Comments burning sensations to 4th & 5th digits     Posture/Postural Control   Posture Comments flat thoracic spine, hinge at C7, bilateral scapular winging     ROM / Strength   AROM / PROM / Strength AROM;Strength     AROM   Overall AROM Comments WFL, hinge point noted at C7/8; limited thoracic mobility  AROM Assessment Site --     Strength   Overall Strength Comments anterior drift of R GHJ in MMT   Strength Assessment Site Shoulder   Right/Left Shoulder Right;Left   Right Shoulder External Rotation 4-/5   Right Shoulder Horizontal ABduction 4-/5     Palpation   Palpation comment concordant pain R subscap trigger point                   [facility] Adult PT Treatment/Exercise - 12/05/16 0001      Exercises   Exercises Shoulder     Shoulder Exercises: Seated   Other Seated Exercises abdominal engagement with scapular retraction- seated and standing for posture     Shoulder Exercises: Standing   Protraction Limitations hands at wall     Shoulder Exercises: Stretch   Corner Stretch Limitations door pec stretch   Other Shoulder Stretches thoracic extension with cervical block     Manual  Therapy   Manual Therapy Myofascial release   Manual therapy comments educated in use of tennis ball for trigger point release   Myofascial Release R subscap & middle trap trigger point release with soft tissue mobilizatin                PT Education - 12/05/16 0948    Education provided Yes   Education Details anatomy of condition, POC, HEP, exercise form/rationale, posture at work and station setup    [employer]) Educated Patient   Methods Explanation;Demonstration;Tactile cues;Verbal cues   Comprehension Verbalized understanding;Returned demonstration;Verbal cues required;Tactile cues required;Need further instruction          PT Short Term Goals - 12/05/16 0957      PT SHORT TERM GOAL #1   Title Pt will verbalize ability to utilize proper contractions in core and periscapular regions to provide proper postural support while working on computer by 4/27   Baseline began educating at eval, difficulty due to poor endurance   Time 4   Period Weeks   Status New     PT SHORT TERM GOAL #2   Title Pt will demo proper static and dynamic UE OKC and CKC exercises to return to strengthening program to continue exercising following d/c   Baseline will progress to practicing those exercises as tolerated and appropriate   Time 4   Period Weeks   Status New     PT SHORT TERM GOAL #3   Title Pt will be able to lift pots, pans, plates etc overhead in the kitchen without increase in pain to complete household chores   Baseline pain at eval, tries to avoid or use L hand   Time 4   Period Weeks   Status New     PT SHORT TERM GOAL #4   Title Pt will be able to sleep with minimal interruption by R shoulder/arm pain   Baseline unable to sleep on R without being woken   Time 4   Period Weeks   Status New     PT SHORT TERM GOAL #5   Title FOTO to 66% ability to indicate significant improvement in functional ability   Baseline 56% ability at eval   Time 4   Period Weeks   Status  New                  Plan - 12/05/16 5697    Clinical Impression Statement Pt presents to PT with complaints of cervical, R shoulder and Right upper extremity burning and limited functional use.  Notable bilateral scapular winging and decreased kyphosis through thoracic spine with cerivcal hinge point at c7/8. Pt works on a computer but reports trying to be cognisant of posture. Discussed stretches given to her by massage therapist and how trigger points can cause referred pain vs radicular pain from brachial plexus. Pt will benefit from skilled PT in order to improve postural strength and endurance and awareness of periscapular contraction to decrease overuse of cervical musculature and decrease pain.    Rehab Potential Good   PT Frequency 2x / week   PT Duration 4 weeks   PT Treatment/Interventions ADLs/Self Care Home Management;Cryotherapy;Electrical Stimulation;Iontophoresis 28m/ml Dexamethasone;Functional mobility training;Ultrasound;Traction;Moist Heat;Therapeutic activities;Therapeutic exercise;Neuromuscular re-education;Patient/family education;Passive range of motion;Manual techniques;Dry needling;Taping   PT Next Visit Plan prone periscapular exercises, thoracic mobility, subscap trigger point release, UBE-retro   PT Home Exercise Plan door pec stretch, supine thoracic extension over pillows, scapular retraction with abdominal engagement, serratus press at wall   Consulted and Agree with Plan of Care Patient      Patient will benefit from skilled therapeutic intervention in order to improve the following deficits and impairments:  Impaired UE functional use, Increased muscle spasms, Decreased activity tolerance, Pain, Improper body mechanics, Decreased strength, Postural dysfunction  Visit Diagnosis: Chronic right shoulder pain - Plan: PT plan of care cert/re-cert  Cervicalgia - Plan: PT plan of care cert/re-cert  Muscle weakness (generalized) - Plan: PT plan of care  cert/re-cert     Problem List Patient Active Problem List   Diagnosis Date Noted  . History of hyperlipidemia 11/08/2016  . History of anxiety 11/08/2016  . History of retinal tear 11/08/2016  . History of Bilateral plantar fasciitis 08/10/2016  . Plantar fasciitis 07/04/2016  . Erythema nodosum 07/04/2016  . High risk medication use 07/04/2016  . Lupus 05/23/2016  . 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 ADHD (attention deficit hyperactivity disorder) 12/22/2014  . Raynaud's phenomenon 10/17/2013  . Hyperlipidemia 09/28/2011   Galen Russman C. Maurie Musco PT, DPT 12/05/16 10:11 AM   [facility] [address]Phone: [phone]  Fax:  [phone] Name: VELWYN LOWDENMRN: [id number] of Birth: 7Mar 06, 1972

## 2016-12-13 ENCOUNTER — Ambulatory Visit: Payer: BLUE CROSS/BLUE SHIELD | Admitting: Physical Therapy

## 2016-12-16 ENCOUNTER — Ambulatory Visit: Payer: BLUE CROSS/BLUE SHIELD | Admitting: Physical Therapy

## 2016-12-20 ENCOUNTER — Encounter: Payer: PRIVATE HEALTH INSURANCE | Admitting: Physical Therapy

## 2016-12-23 ENCOUNTER — Ambulatory Visit: Payer: BLUE CROSS/BLUE SHIELD | Admitting: Physical Therapy

## 2016-12-27 ENCOUNTER — Ambulatory Visit: Payer: BLUE CROSS/BLUE SHIELD | Attending: Family Medicine | Admitting: Physical Therapy

## 2016-12-27 ENCOUNTER — Encounter: Payer: Self-pay | Admitting: Physical Therapy

## 2016-12-27 DIAGNOSIS — M542 Cervicalgia: Secondary | ICD-10-CM | POA: Diagnosis present

## 2016-12-27 DIAGNOSIS — M6281 Muscle weakness (generalized): Secondary | ICD-10-CM | POA: Diagnosis present

## 2016-12-27 DIAGNOSIS — G8929 Other chronic pain: Secondary | ICD-10-CM | POA: Diagnosis present

## 2016-12-27 DIAGNOSIS — M25511 Pain in right shoulder: Secondary | ICD-10-CM | POA: Diagnosis not present

## 2016-12-27 NOTE — Therapy (Signed)
[place], [place], [address] Phone: [phone]   Fax:  [phone]  Physical Therapy Treatment  Patient Details  Name: Robin [name] MRN: [id number] Date of Birth: 04-06-1971 Referring Provider: Bo Merino, MD  Encounter Date: 12/27/2016      PT End of Session - 12/27/16 0848    Visit Number 2   Number of Visits 9   Date for PT Re-Evaluation 01/06/17   Authorization Type [employer]- no visit limit   PT Start Time 0848   PT Stop Time 0930   PT Time Calculation (min) 42 min   Activity Tolerance Patient tolerated treatment well   Behavior During Therapy [facility] for tasks assessed/performed      Past Medical History:  Diagnosis Date  . ADD (attention deficit disorder)   . Allergy   . Anemia   . Anxiety   . Hyperlipidemia   . Ileitis   . Lupus     Past Surgical History:  Procedure Laterality Date  . CESAREAN SECTION     x 2  . DENTAL SURGERY    . EYE SURGERY Bilateral    cryoretinopexy    There were no vitals filed for this visit.      Subjective Assessment - 12/27/16 0848    Subjective Reports shoulder feels like it is improving. Stiffness in upper body when waking up. Still notices biceps hurting a little bit.    Patient Stated Goals lifting overhead-in kitchen, sleep, doing dishes/cleaning   Currently in Pain? No/denies                         [facility] Adult PT Treatment/Exercise - 12/27/16 0001      Shoulder Exercises: Seated   Other Seated Exercises triceps dips     Shoulder Exercises: Prone   Retraction Limitations retraction + GHJ ext   Extension 20 reps   Theraband Level (Shoulder Extension) Level 1 (Yellow)  small abd pull on band     Shoulder Exercises: ROM/Strengthening   UBE (Upper Arm Bike) retro L2 4 min     Shoulder Exercises: Stretch   Table Stretch -Flexion Limitations at counter 3x for a few breaths   Other Shoulder Stretches open book 3x  each     Manual Therapy   Manual Therapy Joint mobilization;Soft tissue mobilization   Joint Mobilization prone rib ER and throacic PA   Soft tissue mobilization IASTM R biceps   Myofascial Release suboccipital release, R levator, R upper trap                PT Education - 12/27/16 0853    Education provided Yes   Education Details exercise form/rationale, HEP   Person(s) Educated Patient   Methods Explanation;Demonstration;Tactile cues;Verbal cues   Comprehension Verbalized understanding;Returned demonstration;Verbal cues required;Tactile cues required;Need further instruction          PT Short Term Goals - 12/05/16 0957      PT SHORT TERM GOAL #1   Title Pt will verbalize ability to utilize proper contractions in core and periscapular regions to provide proper postural support while working on computer by 4/27   Baseline began educating at eval, difficulty due to poor endurance   Time 4   Period Weeks   Status New     PT SHORT TERM GOAL #2   Title Pt will demo proper static and dynamic UE OKC and CKC exercises to return to strengthening program to continue exercising  following d/c   Baseline will progress to practicing those exercises as tolerated and appropriate   Time 4   Period Weeks   Status New     PT SHORT TERM GOAL #3   Title Pt will be able to lift pots, pans, plates etc overhead in the kitchen without increase in pain to complete household chores   Baseline pain at eval, tries to avoid or use L hand   Time 4   Period Weeks   Status New     PT SHORT TERM GOAL #4   Title Pt will be able to sleep with minimal interruption by R shoulder/arm pain   Baseline unable to sleep on R without being woken   Time 4   Period Weeks   Status New     PT SHORT TERM GOAL #5   Title FOTO to 66% ability to indicate significant improvement in functional ability   Baseline 56% ability at eval   Time 4   Period Weeks   Status New                  Plan -  12/27/16 0931    Clinical Impression Statement Pt with Improved rib and spinal mobility but cont to be limited by stiffness that is still present. Improved scapular winging with focus on posture. Weakness noted in triceps and tightness in R biceps. Pt verbalized decreased stiffness following manual therapy. Provided with 3 exercise series to do before bed to improve comfort while sleeping.    PT Next Visit Plan prone periscapular exercises, thoracic mobility, subscap trigger point release, UBE-retro   PT Home Exercise Plan door pec stretch, supine thoracic extension over pillows, scapular retraction with abdominal engagement, serratus press at wall   Consulted and Agree with Plan of Care Patient      Patient will benefit from skilled therapeutic intervention in order to improve the following deficits and impairments:     Visit Diagnosis: Chronic right shoulder pain  Cervicalgia  Muscle weakness (generalized)     Problem List Patient Active Problem List   Diagnosis Date Noted  . History of hyperlipidemia 11/08/2016  . History of anxiety 11/08/2016  . History of retinal tear 11/08/2016  . History of Bilateral plantar fasciitis 08/10/2016  . Plantar fasciitis 07/04/2016  . Erythema nodosum 07/04/2016  . High risk medication use 07/04/2016  . Lupus 05/23/2016  . 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 ADHD (attention deficit hyperactivity disorder) 12/22/2014  . Raynaud's phenomenon 10/17/2013  . Hyperlipidemia 09/28/2011    Lillyn Wieczorek C. Abdalrahman Clementson PT, DPT 12/27/16 9:34 AM   [facility] [address] [place], [place], [address] Phone: [phone]   Fax:  [phone]  Name: CORRA KAINE MRN: [id number] Date of Birth: 09-19-1970

## 2016-12-30 ENCOUNTER — Ambulatory Visit: Payer: BLUE CROSS/BLUE SHIELD | Admitting: Physical Therapy

## 2016-12-30 DIAGNOSIS — M25511 Pain in right shoulder: Secondary | ICD-10-CM | POA: Diagnosis not present

## 2016-12-30 DIAGNOSIS — M542 Cervicalgia: Secondary | ICD-10-CM

## 2016-12-30 DIAGNOSIS — G8929 Other chronic pain: Secondary | ICD-10-CM

## 2016-12-30 DIAGNOSIS — M6281 Muscle weakness (generalized): Secondary | ICD-10-CM

## 2016-12-30 NOTE — Therapy (Signed)
[place], [place], [address] Phone: [phone]   Fax:  [phone]  Physical Therapy Treatment  Patient Details  Name: Robin [name] MRN: [id number] Date of Birth: 09-21-1970 Referring Provider: Bo Merino, MD  Encounter Date: 12/30/2016      PT End of Session - 12/30/16 0910    Visit Number 3   Number of Visits 9   Date for PT Re-Evaluation 01/06/17   Authorization Type A[employer]- no visit limit   PT Start Time 0856   PT Stop Time 0945   PT Time Calculation (min) 49 min      Past Medical History:  Diagnosis Date  . ADD (attention deficit disorder)   . Allergy   . Anemia   . Anxiety   . Hyperlipidemia   . Ileitis   . Lupus     Past Surgical History:  Procedure Laterality Date  . CESAREAN SECTION     x 2  . DENTAL SURGERY    . EYE SURGERY Bilateral    cryoretinopexy    There were no vitals filed for this visit.      Subjective Assessment - 12/30/16 0905    Currently in Pain? Yes   Pain Score 3    Pain Location Shoulder  and neck   Pain Orientation Right   Pain Descriptors / Indicators Aching   Aggravating Factors  sleep positions    Pain Relieving Factors stretches                          [facility] Adult PT Treatment/Exercise - 12/30/16 0001      Lumbar Exercises: Quadruped   Madcat/Old Horse 10 reps   Madcat/Old Horse Limitations focusing on extension   Single Arm Raise 10 reps   Single Arm Raises Limitations reports mild pain in sternum      Shoulder Exercises: Prone   Retraction Limitations retraction + GHJ ext   Extension 20 reps   Theraband Level (Shoulder Extension) Level 1 (Yellow)  small abd pull on band   Horizontal ABduction 1 10 reps     Shoulder Exercises: Standing   Protraction Limitations hands at wall   Extension 15 reps   Theraband Level (Shoulder Extension) Level 2 (Red)   Row 15 reps   Theraband Level (Shoulder Row) Level 2  (Red)     Shoulder Exercises: ROM/Strengthening   UBE (Upper Arm Bike) retro L2 4 min  forward 2 minutes      Shoulder Exercises: Stretch   Corner Stretch Limitations door pec stretch     Modalities   Modalities Moist Heat     Moist Heat Therapy   Number Minutes Moist Heat 15 Minutes   Moist Heat Location --  upper back      Manual Therapy   Joint Mobilization Rt scapular mobs   Myofascial Release prone upper, middle trap, rhomboids, sub scap medial borders                  PT Short Term Goals - 12/05/16 0957      PT SHORT TERM GOAL #1   Title Pt will verbalize ability to utilize proper contractions in core and periscapular regions to provide proper postural support while working on computer by 4/27   Baseline began educating at eval, difficulty due to poor endurance   Time 4   Period Weeks   Status New     PT SHORT TERM GOAL #  2   Title Pt will demo proper static and dynamic UE OKC and CKC exercises to return to strengthening program to continue exercising following d/c   Baseline will progress to practicing those exercises as tolerated and appropriate   Time 4   Period Weeks   Status New     PT SHORT TERM GOAL #3   Title Pt will be able to lift pots, pans, plates etc overhead in the kitchen without increase in pain to complete household chores   Baseline pain at eval, tries to avoid or use L hand   Time 4   Period Weeks   Status New     PT SHORT TERM GOAL #4   Title Pt will be able to sleep with minimal interruption by R shoulder/arm pain   Baseline unable to sleep on R without being woken   Time 4   Period Weeks   Status New     PT SHORT TERM GOAL #5   Title FOTO to 66% ability to indicate significant improvement in functional ability   Baseline 56% ability at eval   Time 4   Period Weeks   Status New                  Plan - 12/30/16 0940    Clinical Impression Statement Pt reports exercises make her feel better. She is in some pain  today due to rushing around and not having time to do the stretches. We continued scapular strengthening in various positions. Performed manul to upper back and traps followed by HMP to decrease tension and pain.    PT Next Visit Plan prone periscapular exercises, thoracic mobility, subscap trigger point release, UBE-retro   PT Home Exercise Plan door pec stretch, supine thoracic extension over pillows, scapular retraction with abdominal engagement, serratus press at wall   Consulted and Agree with Plan of Care Patient      Patient will benefit from skilled therapeutic intervention in order to improve the following deficits and impairments:  Impaired UE functional use, Increased muscle spasms, Decreased activity tolerance, Pain, Improper body mechanics, Decreased strength, Postural dysfunction  Visit Diagnosis: Chronic right shoulder pain  Cervicalgia  Muscle weakness (generalized)     Problem List Patient Active Problem List   Diagnosis Date Noted  . History of hyperlipidemia 11/08/2016  . History of anxiety 11/08/2016  . History of retinal tear 11/08/2016  . History of Bilateral plantar fasciitis 08/10/2016  . Plantar fasciitis 07/04/2016  . Erythema nodosum 07/04/2016  . High risk medication use 07/04/2016  . Lupus 05/23/2016  . 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 ADHD (attention deficit hyperactivity disorder) 12/22/2014  . Raynaud's phenomenon 10/17/2013  . Hyperlipidemia 09/28/2011    Dorene Ar, PTA 12/30/2016, 9:42 AM  [facility] [address] [place], [place], [address] Phone: [phone]   Fax:  [phone]  Name: DONASIA WIMES MRN: [id number] Date of Birth: July 08, 1971

## 2017-01-03 ENCOUNTER — Encounter: Payer: Self-pay | Admitting: Physical Therapy

## 2017-01-03 ENCOUNTER — Ambulatory Visit: Payer: BLUE CROSS/BLUE SHIELD | Admitting: Physical Therapy

## 2017-01-03 DIAGNOSIS — M6281 Muscle weakness (generalized): Secondary | ICD-10-CM

## 2017-01-03 DIAGNOSIS — M25511 Pain in right shoulder: Principal | ICD-10-CM

## 2017-01-03 DIAGNOSIS — M542 Cervicalgia: Secondary | ICD-10-CM

## 2017-01-03 DIAGNOSIS — G8929 Other chronic pain: Secondary | ICD-10-CM

## 2017-01-03 NOTE — Therapy (Signed)
[place], [place], [address] Phone: [phone]   Fax:  [phone]  Physical Therapy Treatment  Patient Details  Name: Robin [name] MRN: [id number] Date of Birth: 12-03-1970 Referring Provider: Bo Merino, MD  Encounter Date: 01/03/2017      PT End of Session - 01/03/17 1200    Visit Number 4   Number of Visits 9   Date for PT Re-Evaluation 01/06/17   Authorization Type [employer]- no visit limit   PT Start Time 0856   PT Stop Time 0940   PT Time Calculation (min) 44 min   Activity Tolerance Patient tolerated treatment well   Behavior During Therapy [facility] for tasks assessed/performed      Past Medical History:  Diagnosis Date  . ADD (attention deficit disorder)   . Allergy   . Anemia   . Anxiety   . Hyperlipidemia   . Ileitis   . Lupus     Past Surgical History:  Procedure Laterality Date  . CESAREAN SECTION     x 2  . DENTAL SURGERY    . EYE SURGERY Bilateral    cryoretinopexy    There were no vitals filed for this visit.      Subjective Assessment - 01/03/17 0859    Subjective Shoulder feels very stiff today as she was sitting a lot yesterday. Did her exercises before bed and that helped as she was falling asleep.   Currently in Pain? Yes   Pain Score 4    Pain Location Shoulder   Pain Orientation Right   Pain Descriptors / Indicators Aching   Aggravating Factors  sleep positions   Pain Relieving Factors stretches, changing positions                         [facility] Adult PT Treatment/Exercise - 01/03/17 0001      Lumbar Exercises: Quadruped   Madcat/Old Horse 10 reps   Madcat/Old Horse Limitations focusing on extension   Single Arm Raise 10 reps     Shoulder Exercises: Prone   Retraction Limitations retraction + GHJ ext   Extension 20 reps   Theraband Level (Shoulder Extension) Level 1 (Yellow)  small abd pull on band   Horizontal ABduction 1 10  reps     Shoulder Exercises: Standing   Extension 20 reps   Theraband Level (Shoulder Extension) Level 3 (Green)   Row 20 reps   Theraband Level (Shoulder Row) Level 3 (Green)     Shoulder Exercises: ROM/Strengthening   UBE (Upper Arm Bike) L2 3 minutes forward, 3 minutes backwards     Shoulder Exercises: Stretch   Other Shoulder Stretches open book 10x each 10 second hold     Modalities   Modalities Moist Heat     Moist Heat Therapy   Number Minutes Moist Heat 10 Minutes   Moist Heat Location Cervical;Shoulder     Manual Therapy   Manual Therapy Joint mobilization   Joint Mobilization Rt scapular mobs                  PT Short Term Goals - 12/05/16 0957      PT SHORT TERM GOAL #1   Title Pt will verbalize ability to utilize proper contractions in core and periscapular regions to provide proper postural support while working on computer by 4/27   Baseline began educating at eval, difficulty due to poor endurance   Time 4  Period Weeks   Status New     PT SHORT TERM GOAL #2   Title Pt will demo proper static and dynamic UE OKC and CKC exercises to return to strengthening program to continue exercising following d/c   Baseline will progress to practicing those exercises as tolerated and appropriate   Time 4   Period Weeks   Status New     PT SHORT TERM GOAL #3   Title Pt will be able to lift pots, pans, plates etc overhead in the kitchen without increase in pain to complete household chores   Baseline pain at eval, tries to avoid or use L hand   Time 4   Period Weeks   Status New     PT SHORT TERM GOAL #4   Title Pt will be able to sleep with minimal interruption by R shoulder/arm pain   Baseline unable to sleep on R without being woken   Time 4   Period Weeks   Status New     PT SHORT TERM GOAL #5   Title FOTO to 66% ability to indicate significant improvement in functional ability   Baseline 56% ability at eval   Time 4   Period Weeks   Status  New                  Plan - 01/03/17 1201    Clinical Impression Statement Pt stated she felt better after her last session. Pt reports exercises make her feel better when she does them before bed. She is doing better with remembering to do her stretches. Continued scapular strengthening in different positions. Required cues for back extension in mad cat/old horse. Performed scapular mobs to decrease pain and increase mobility. Applied HMP at end of session. Upon standing up patient said she felt like her pain was a 1/10 or 2/10.    Rehab Potential Good   PT Frequency 2x / week   PT Duration 4 weeks   PT Treatment/Interventions ADLs/Self Care Home Management;Cryotherapy;Electrical Stimulation;Iontophoresis 41m/ml Dexamethasone;Functional mobility training;Ultrasound;Traction;Moist Heat;Therapeutic activities;Therapeutic exercise;Neuromuscular re-education;Patient/family education;Passive range of motion;Manual techniques;Dry needling;Taping   PT Next Visit Plan prone periscapular exercises, thoracic mobility, subscap trigger point release, UBE-retro   PT Home Exercise Plan door pec stretch, supine thoracic extension over pillows, scapular retraction with abdominal engagement, serratus press at wall   Consulted and Agree with Plan of Care Patient      Patient will benefit from skilled therapeutic intervention in order to improve the following deficits and impairments:  Impaired UE functional use, Increased muscle spasms, Decreased activity tolerance, Pain, Improper body mechanics, Decreased strength, Postural dysfunction  Visit Diagnosis: Chronic right shoulder pain  Muscle weakness (generalized)  Cervicalgia     Problem List Patient Active Problem List   Diagnosis Date Noted  . History of hyperlipidemia 11/08/2016  . History of anxiety 11/08/2016  . History of retinal tear 11/08/2016  . History of Bilateral plantar fasciitis 08/10/2016  . Plantar fasciitis 07/04/2016  .  Erythema nodosum 07/04/2016  . High risk medication use 07/04/2016  . Lupus 05/23/2016  . 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 ADHD (attention deficit hyperactivity disorder) 12/22/2014  . Raynaud's phenomenon 10/17/2013  . Hyperlipidemia 09/28/2011    Robin [name] 01/03/2017, 2:43 PM  [facility] [address]Phone: [phone]  Fax:  [phone] Name: VLAKERIA STARKMANMRN: [id number] of Birth: 725-Jan-1972

## 2017-01-06 ENCOUNTER — Encounter: Payer: Self-pay | Admitting: Physical Therapy

## 2017-01-06 ENCOUNTER — Ambulatory Visit: Payer: BLUE CROSS/BLUE SHIELD | Admitting: Physical Therapy

## 2017-01-06 DIAGNOSIS — M25511 Pain in right shoulder: Principal | ICD-10-CM

## 2017-01-06 DIAGNOSIS — M542 Cervicalgia: Secondary | ICD-10-CM

## 2017-01-06 DIAGNOSIS — M6281 Muscle weakness (generalized): Secondary | ICD-10-CM

## 2017-01-06 DIAGNOSIS — G8929 Other chronic pain: Secondary | ICD-10-CM

## 2017-01-06 NOTE — Therapy (Signed)
[place], [place], [address] Phone: [phone]   Fax:  [phone]  Physical Therapy Treatment  Patient Details  Name: Robin [name] MRN: [id number] Date of Birth: [birth year] Referring Provider: Bo Merino, MD  Encounter Date: 01/06/2017      PT End of Session - 01/06/17 0937    Visit Number 5   Number of Visits 9   Date for PT Re-Evaluation 01/06/17   Authorization Type Aet[employer]- no visit limit   PT Start Time 0856   PT Stop Time 0942   PT Time Calculation (min) 46 min   Activity Tolerance Patient tolerated treatment well   Behavior During Therapy [facility] for tasks assessed/performed      Past Medical History:  Diagnosis Date  . ADD (attention deficit disorder)   . Allergy   . Anemia   . Anxiety   . Hyperlipidemia   . Ileitis   . Lupus     Past Surgical History:  Procedure Laterality Date  . CESAREAN SECTION     x 2  . DENTAL SURGERY    . EYE SURGERY Bilateral    cryoretinopexy    There were no vitals filed for this visit.      Subjective Assessment - 01/06/17 0859    Subjective Pt states her left shoulder is now bothering her. Left shoulder got tense while doing dishes and locked up and even stretches didn't help. Took pain med to help.   Currently in Pain? Yes   Pain Score 2    Pain Location Shoulder   Pain Orientation Left   Pain Descriptors / Indicators Aching  deep ache                         [facility] Adult PT Treatment/Exercise - 01/06/17 0001      Shoulder Exercises: Supine   Other Supine Exercises supine scapular stabilization series x 15 each red     Shoulder Exercises: Prone   Retraction Limitations retraction + GHJ ext   Extension 20 reps   Theraband Level (Shoulder Extension) Level 2 (Red)     Shoulder Exercises: Standing   Extension 20 reps   Theraband Level (Shoulder Extension) Level 3 (Green)   Row 20 reps   Theraband Level (Shoulder  Row) Level 3 (Green)     Shoulder Exercises: ROM/Strengthening   UBE (Upper Arm Bike) L2 3 minutes forward, 3 minutes backwards     Shoulder Exercises: Stretch   Other Shoulder Stretches open book 10x each     Modalities   Modalities Moist Heat     Moist Heat Therapy   Number Minutes Moist Heat 10 Minutes   Moist Heat Location Cervical;Shoulder                PT Education - 01/06/17 0934    Education provided Yes   Education Details HEP   Person(s) Educated Patient   Methods Explanation;Demonstration;Verbal cues;Handout   Comprehension Verbalized understanding;Returned demonstration;Verbal cues required          PT Short Term Goals - 01/06/17 1056      PT SHORT TERM GOAL #1   Title Pt will verbalize ability to utilize proper contractions in core and periscapular regions to provide proper postural support while working on computer by 4/27   Baseline Patient says she is more aware of her shoulder blades and where they are as she goes about her day. She makes sure to keep  them away from her ears.   Time 4   Period Weeks   Status On-going     PT SHORT TERM GOAL #2   Title Pt will demo proper static and dynamic UE OKC and CKC exercises to return to strengthening program to continue exercising following d/c   Baseline have established OKC HEP   Time 4   Period Weeks   Status Partially Met     PT SHORT TERM GOAL #3   Title Pt will be able to lift pots, pans, plates etc overhead in the kitchen without increase in pain to complete household chores   Baseline Able to use her arm more as moving it seems to help   Time 4   Period Weeks   Status On-going     PT SHORT TERM GOAL #4   Title Pt will be able to sleep with minimal interruption by R shoulder/arm pain   Time 4   Period Weeks   Status Unable to assess     PT SHORT TERM GOAL #5   Title FOTO to 66% ability to indicate significant improvement in functional ability   Baseline 56% ability at eval   Time 4    Period Weeks   Status Unable to assess                  Plan - 01/06/17 0939    Clinical Impression Statement Ms Hoadley was 10 minutes late. Patient stated that her left shoulder is now bothering her. She tried to do her stretches but it did not help. Patient did well with UBE and strengthening exercises; no increase in pain. Patient is able to do rows and standing extension independently and feels it helps. Instructed patient on how to use theraband in door to do these two exercises at home. Given green theraband. Introduced supine scapular stabilization exercises. Patient required mod cues to perform sash correctly. HMP applied. Patient stated she felt better and looser after treatment.   Rehab Potential Good   PT Frequency 2x / week   PT Duration 4 weeks   PT Treatment/Interventions ADLs/Self Care Home Management;Cryotherapy;Electrical Stimulation;Iontophoresis 50m/ml Dexamethasone;Functional mobility training;Ultrasound;Traction;Moist Heat;Therapeutic activities;Therapeutic exercise;Neuromuscular re-education;Patient/family education;Passive range of motion;Manual techniques;Dry needling;Taping   PT Next Visit Plan prone periscapular exercises, thoracic mobility, subscap trigger point release, UBE-retro, supine scapular stabilization series; check goals; FOTO   PT Home Exercise Plan door pec stretch, supine thoracic extension over pillows, scapular retraction with abdominal engagement, serratus press at wall, rows and extension with theraband   Consulted and Agree with Plan of Care Patient      Patient will benefit from skilled therapeutic intervention in order to improve the following deficits and impairments:  Impaired UE functional use, Increased muscle spasms, Decreased activity tolerance, Pain, Improper body mechanics, Decreased strength, Postural dysfunction  Visit Diagnosis: Chronic right shoulder pain  Muscle weakness (generalized)  Cervicalgia     Problem  List Patient Active Problem List   Diagnosis Date Noted  . History of hyperlipidemia 11/08/2016  . History of anxiety 11/08/2016  . History of retinal tear 11/08/2016  . History of Bilateral plantar fasciitis 08/10/2016  . Plantar fasciitis 07/04/2016  . Erythema nodosum 07/04/2016  . High risk medication use 07/04/2016  . Lupus 05/23/2016  . 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 03/18/2016  . ADHD (attention deficit hyperactivity disorder) 12/22/2014  . Raynaud's phenomenon 10/17/2013  . Hyperlipidemia 09/28/2011    RJanna Arch SPTA 01/06/2017, 10:59 AM  [facility] [place]  [place], [place], [address] Phone: [phone]   Fax:  [phone]  Name: LADORIS LYTHGOE MRN: [id number] Date of Birth: Dec 30, 1970

## 2017-01-06 NOTE — Patient Instructions (Signed)
EXTENSION: Standing - Resistance Band: Stable (Active)   Stand. Against green resistance band, draw arms backward, as far as possible, keeping elbow straight. Complete __2_ sets of _10__ repetitions. Perform __2_ sessions per day.      Copyright  VHI. All rights reserved.  Resistive Band Rowing   With resistive band anchored in door, grasp both ends. Keeping elbows bent, pull back, squeezing shoulder blades together. Hold _3___ seconds. Repeat __20__ times. Do ___2_ sessions per day.  [email or web address]   Copyright  VHI. All rights reserved.

## 2017-01-10 ENCOUNTER — Encounter: Payer: Self-pay | Admitting: Physical Therapy

## 2017-01-10 ENCOUNTER — Ambulatory Visit: Payer: BLUE CROSS/BLUE SHIELD | Attending: Family Medicine | Admitting: Physical Therapy

## 2017-01-10 DIAGNOSIS — G8929 Other chronic pain: Secondary | ICD-10-CM | POA: Diagnosis present

## 2017-01-10 DIAGNOSIS — M25511 Pain in right shoulder: Secondary | ICD-10-CM | POA: Diagnosis present

## 2017-01-10 DIAGNOSIS — M542 Cervicalgia: Secondary | ICD-10-CM | POA: Diagnosis present

## 2017-01-10 DIAGNOSIS — M6281 Muscle weakness (generalized): Secondary | ICD-10-CM | POA: Diagnosis present

## 2017-01-10 NOTE — Therapy (Signed)
[place], [place], [address] Phone: [phone]   Fax:  [phone]  Physical Therapy Treatment  Patient Details  Name: Robin [name] MRN: [id number] Date of Birth: 10/23/1970 Referring Provider: Bo Merino, MD  Encounter Date: 01/10/2017      PT End of Session - 01/10/17 1021    Visit Number 6   Number of Visits 15   Date for PT Re-Evaluation 02/10/17   PT Start Time 0930   PT Stop Time 1022   PT Time Calculation (min) 52 min   Activity Tolerance Patient tolerated treatment well   Behavior During Therapy [facility] for tasks assessed/performed      Past Medical History:  Diagnosis Date  . ADD (attention deficit disorder)   . Allergy   . Anemia   . Anxiety   . Hyperlipidemia   . Ileitis   . Lupus     Past Surgical History:  Procedure Laterality Date  . CESAREAN SECTION     x 2  . DENTAL SURGERY    . EYE SURGERY Bilateral    cryoretinopexy    There were no vitals filed for this visit.      Subjective Assessment - 01/10/17 0947    Subjective Pt states she has a knot in her left shoulder but it is not hurting. Her right arm has some sharp pains shooting down it and her right hand is a little numb. pt reports spasm of R shoulder that comes and goes depending on aggrivating/alleviating factors listed below. Feels that she has loosened enough to obtain appropraite posture but now does not have stability in that posture.    Patient Stated Goals lifting overhead-in kitchen, sleep, doing dishes/cleaning   Currently in Pain? No/denies   Aggravating Factors  waking up sleeping on L shoulder, thoracic extension   Pain Relieving Factors using theraband exercises, situps, jogging, serratus push ups on wall            [facility] PT Assessment - 01/10/17 0001      Assessment   Medical Diagnosis cervical spine and R shoulder tendonitis   Referring Provider Bo Merino, MD   Onset  Date/Surgical Date --  Nov 2017     Precautions   Precaution Comments systemic lupus flare ups     Observation/Other Assessments   Focus on Therapeutic Outcomes (FOTO)  58% ability     Strength   Right Shoulder External Rotation 5/5   Right Shoulder Horizontal ABduction 4/5                     [facility] Adult PT Treatment/Exercise - 01/10/17 0001      Shoulder Exercises: Prone   Theraband Level (Shoulder External Rotation) Level 2 (Red)   External Rotation Limitations qped on elbows   Other Prone Exercises QPEd weight bearing circles on green plyo ball     Shoulder Exercises: ROM/Strengthening   UBE (Upper Arm Bike) UBE 8 min                 PT Education - 01/10/17 1210    Education provided Yes   Education Details anatomy of condition, exercise form/rationale, HEP, using exercises at work, POC   [employer]) Educated Patient   Methods Explanation;Demonstration;Tactile cues;Verbal cues;Handout   Comprehension Verbalized understanding;Returned demonstration;Verbal cues required;Tactile cues required;Need further instruction          PT Short Term Goals - 01/10/17 0938      PT SHORT TERM  GOAL #1   Title Pt will verbalize ability to utilize proper contractions in core and periscapular regions to provide proper postural support while working on computer by 6/1   Baseline Pt is able to decrease pain utilizing exercises but unable to decrease it enough to remove effects on daily and work activities   Status On-going     PT [place] #2   Title Pt will demo proper static and dynamic UE OKC and CKC exercises to return to strengthening program to continue exercising following d/c   Baseline requries further stability challenges in CKC   Status Partially Met     PT SHORT TERM GOAL #3   Title Pt will be able to lift pots, pans, plates etc overhead in the kitchen without increase in pain to complete household chores   Baseline is trying to use it more but is  severely limited when spasms arise   Status On-going     PT SHORT TERM GOAL #4   Title Pt will be able to sleep with minimal interruption by R shoulder/arm pain   Baseline able to sleep without waking up from pain   Time 4   Period Weeks   Status Achieved     PT SHORT TERM GOAL #5   Title FOTO to 66% ability to indicate significant improvement in functional ability   Baseline 58% ability on 5/1   Status On-going                  Plan - 01/10/17 1211    Clinical Impression Statement Pt has made improvements in postural awareness, sleeping ability and controlling pain utilizing proper stretches and exercises. Recent spasms are a result of poor stability and utilization of improved strength in proper postures. Focused on stability exercises utilizing full body positioning which pt tolerated well. Numbness in 4th and 5th digits was resolved with ulnar nerve glides. Pt will continue to benefit from skilled PT in order to improve stability of cervico-thoracic and GHJ regions to decrease muscular spasm and improve endurance in positioning tolerance.    PT Frequency 2x / week   PT Duration 4 weeks   PT Treatment/Interventions ADLs/Self Care Home Management;Cryotherapy;Electrical Stimulation;Iontophoresis 76m/ml Dexamethasone;Functional mobility training;Ultrasound;Traction;Moist Heat;Therapeutic activities;Therapeutic exercise;Neuromuscular re-education;Patient/family education;Passive range of motion;Manual techniques;Dry needling;Taping   PT Next Visit Plan stabilization exercises   PT Home Exercise Plan door pec stretch, supine thoracic extension over pillows, scapular retraction with abdominal engagement, serratus press at wall, rows and extension with theraband; ulnar nerve glides; qped: hip ext, rocking, ball circles, ER red tband; abd+ext behind back prone on ball and standing   Consulted and Agree with Plan of Care Patient      Patient will benefit from skilled therapeutic  intervention in order to improve the following deficits and impairments:  Impaired UE functional use, Increased muscle spasms, Decreased activity tolerance, Pain, Improper body mechanics, Decreased strength, Postural dysfunction  Visit Diagnosis: Chronic right shoulder pain - Plan: PT plan of care cert/re-cert  Muscle weakness (generalized) - Plan: PT plan of care cert/re-cert  Cervicalgia - Plan: PT plan of care cert/re-cert     Problem List Patient Active Problem List   Diagnosis Date Noted  . History of hyperlipidemia 11/08/2016  . History of anxiety 11/08/2016  . History of retinal tear 11/08/2016  . History of Bilateral plantar fasciitis 08/10/2016  . Plantar fasciitis 07/04/2016  . Erythema nodosum 07/04/2016  . High risk medication use 07/04/2016  . Lupus 05/23/2016  . 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  [place]) 03/18/2016  . ADHD (attention deficit hyperactivity disorder) 12/22/2014  . Raynaud's phenomenon 10/17/2013  . Hyperlipidemia 09/28/2011    Todd Argabright C. Jahron Hunsinger PT, DPT 01/10/17 12:20 PM   [facility] [address] [place], [place], [address] Phone: [phone]   Fax:  [phone]  Name: VYLET MAFFIA MRN: [id number] Date of Birth: [birth year]

## 2017-01-13 ENCOUNTER — Encounter: Payer: Self-pay | Admitting: Physical Therapy

## 2017-01-13 ENCOUNTER — Ambulatory Visit: Payer: BLUE CROSS/BLUE SHIELD | Admitting: Physical Therapy

## 2017-01-13 DIAGNOSIS — M25511 Pain in right shoulder: Secondary | ICD-10-CM | POA: Diagnosis not present

## 2017-01-13 DIAGNOSIS — G8929 Other chronic pain: Secondary | ICD-10-CM

## 2017-01-13 DIAGNOSIS — M542 Cervicalgia: Secondary | ICD-10-CM

## 2017-01-13 DIAGNOSIS — M6281 Muscle weakness (generalized): Secondary | ICD-10-CM

## 2017-01-13 NOTE — Therapy (Signed)
[place], [place], [address] Phone: [phone]   Fax:  [phone]  Physical Therapy Treatment  Patient Details  Name: Robin [name] MRN: [id number] Date of Birth: 1970/10/28 Referring Provider: Bo Merino, MD  Encounter Date: 01/13/2017      PT End of Session - 01/13/17 1012    Visit Number 7   Number of Visits 15   Date for PT Re-Evaluation 02/10/17   Authorization Type [employer]- no visit limit   PT Start Time 0932   PT Stop Time 1015   PT Time Calculation (min) 43 min   Activity Tolerance Patient tolerated treatment well   Behavior During Therapy [facility] for tasks assessed/performed      Past Medical History:  Diagnosis Date  . ADD (attention deficit disorder)   . Allergy   . Anemia   . Anxiety   . Hyperlipidemia   . Ileitis   . Lupus     Past Surgical History:  Procedure Laterality Date  . CESAREAN SECTION     x 2  . DENTAL SURGERY    . EYE SURGERY Bilateral    cryoretinopexy    There were no vitals filed for this visit.      Subjective Assessment - 01/13/17 0936    Subjective Patient states that she has not had the locking up problem this week. She has been doing exercises. She feels they help.   Currently in Pain? Yes   Pain Score 1    Pain Location Shoulder   Pain Orientation Left   Pain Descriptors / Indicators Aching                         [facility] Adult PT Treatment/Exercise - 01/13/17 0001      Lumbar Exercises: Quadruped   Single Arm Raise 10 reps;3 seconds   Single Arm Raises Limitations bilateral   Straight Leg Raise 10 reps;3 seconds  bilateral   Straight Leg Raises Limitations hip ext with knee flex donkey kicks; 10 x each leg; cues to keep back neutral   Plank 30 sec x 3; elbows and feet; 3 x with yellow theraband pulses x 10     Shoulder Exercises: Prone   Other Prone Exercises shoulder ext with yellow theraband on exercise ball x 15  billateral; horizontal abduction on ball x 10 bilateral     Shoulder Exercises: Standing   Extension 20 reps   Theraband Level (Shoulder Extension) Level 3 (Green)   Row 20 reps   Theraband Level (Shoulder Row) Level 3 (Green)   Row Limitations attempted mid rows 5x; patient stated they hurt her bicep     Shoulder Exercises: ROM/Strengthening   Other ROM/Strengthening Exercises NuStep UE only L 4 x 5 minutes                PT Education - 01/13/17 1035    Education provided Yes   Education Details HEP; importance of good exercise form   Person(s) Educated Patient   Methods Explanation;Verbal cues;Handout;Demonstration   Comprehension Verbalized understanding;Returned demonstration          PT Short Term Goals - 01/13/17 1009      PT SHORT TERM GOAL #1   Title Pt will verbalize ability to utilize proper contractions in core and periscapular regions to provide proper postural support while working on computer by 6/1   Baseline Patient feels that she is more aware of posture but thinks she  requires more time to get better about it.   Time 4   Period Weeks   Status Partially Met     PT SHORT TERM GOAL #2   Title Pt will demo proper static and dynamic UE OKC and CKC exercises to return to strengthening program to continue exercising following d/c   Baseline good form during planks   Status Partially Met     PT SHORT TERM GOAL #3   Title Pt will be able to lift pots, pans, plates etc overhead in the kitchen without increase in pain to complete household chores   Baseline doesn't hurt anymore to do these things; still weaker   Status Achieved     PT SHORT TERM GOAL #4   Title Pt will be able to sleep with minimal interruption by R shoulder/arm pain   Status Achieved     PT SHORT TERM GOAL #5   Title FOTO to 66% ability to indicate significant improvement in functional ability   Status On-going                  Plan - 01/13/17 1035    Clinical  Impression Statement Patient stated that she is having less pain and stiffness in her shoulders. Patient reports she has hard time performing HEP sometimes dues to muscle spasms. Pt stated stretching helps. Advised not to perform exercises if muscle spasms cause form fail. Worked on quadreped strengthening and stabilization; required min cues for good form. Required mod cues for good form with planks on knees. She has made progress towards STGs #1 and #2. She has met STG #3.    Rehab Potential Good   PT Frequency 2x / week   PT Duration 4 weeks   PT Treatment/Interventions ADLs/Self Care Home Management;Cryotherapy;Electrical Stimulation;Iontophoresis 54m/ml Dexamethasone;Functional mobility training;Ultrasound;Traction;Moist Heat;Therapeutic activities;Therapeutic exercise;Neuromuscular re-education;Patient/family education;Passive range of motion;Manual techniques;Dry needling;Taping   PT Next Visit Plan stabilization exercises   PT Home Exercise Plan door pec stretch, supine thoracic extension over pillows, scapular retraction with abdominal engagement, serratus press at wall, rows and extension with theraband; ulnar nerve glides; qped: hip ext, rocking, ball circles, ER red tband; abd+ext behind back prone on ball and standing      Patient will benefit from skilled therapeutic intervention in order to improve the following deficits and impairments:  Impaired UE functional use, Increased muscle spasms, Decreased activity tolerance, Pain, Improper body mechanics, Decreased strength, Postural dysfunction  Visit Diagnosis: Chronic right shoulder pain  Cervicalgia  Muscle weakness (generalized)     Problem List Patient Active Problem List   Diagnosis Date Noted  . History of hyperlipidemia 11/08/2016  . History of anxiety 11/08/2016  . History of retinal tear 11/08/2016  . History of Bilateral plantar fasciitis 08/10/2016  . Plantar fasciitis 07/04/2016  . Erythema nodosum 07/04/2016  .  High risk medication use 07/04/2016  . Lupus 05/23/2016  . 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 03/18/2016  . ADHD (attention deficit hyperactivity disorder) 12/22/2014  . Raynaud's phenomenon 10/17/2013  . Hyperlipidemia 09/28/2011    RJanna Arch SPTA 01/13/2017, 10:41 AM  [facility] [address]Phone: [phone]  Fax:  [phone] Name: VBREDA BONDMRN: [id number] of Birth: 709/30/1972

## 2017-01-17 ENCOUNTER — Ambulatory Visit: Payer: BLUE CROSS/BLUE SHIELD | Admitting: Physical Therapy

## 2017-01-17 ENCOUNTER — Other Ambulatory Visit: Payer: Self-pay | Admitting: Family Medicine

## 2017-01-17 ENCOUNTER — Encounter: Payer: Self-pay | Admitting: Physical Therapy

## 2017-01-17 DIAGNOSIS — M25511 Pain in right shoulder: Secondary | ICD-10-CM | POA: Diagnosis not present

## 2017-01-17 DIAGNOSIS — G8929 Other chronic pain: Secondary | ICD-10-CM

## 2017-01-17 DIAGNOSIS — M542 Cervicalgia: Secondary | ICD-10-CM

## 2017-01-17 DIAGNOSIS — Z760 Encounter for issue of repeat prescription: Secondary | ICD-10-CM

## 2017-01-17 DIAGNOSIS — M6281 Muscle weakness (generalized): Secondary | ICD-10-CM

## 2017-01-17 NOTE — Telephone Encounter (Signed)
Pt calling for a refill on Adderall. Last OV with Dr Tamala Julian 1/31 for 6 month check up. Would like to know if an OV is absolutely necessary for this refill as she was seen for a 6 month check up, or can it be refilled as is.

## 2017-01-17 NOTE — Therapy (Signed)
[place], [place], [address] Phone: [phone]   Fax:  [phone]  Physical Therapy Treatment  Patient Details  Name: Robin [name] MRN: [id number] Date of Birth: 06/07/1971 Referring Provider: Bo Merino, MD  Encounter Date: 01/17/2017      PT End of Session - 01/17/17 0949    Visit Number 8   Number of Visits 15   Date for PT Re-Evaluation 02/10/17   Authorization Type [employer]- no visit limit   PT Start Time 6222  pt arrived late   PT Stop Time 1016   PT Time Calculation (min) 35 min   Activity Tolerance Patient tolerated treatment well   Behavior During Therapy [facility] for tasks assessed/performed      Past Medical History:  Diagnosis Date  . ADD (attention deficit disorder)   . Allergy   . Anemia   . Anxiety   . Hyperlipidemia   . Ileitis   . Lupus     Past Surgical History:  Procedure Laterality Date  . CESAREAN SECTION     x 2  . DENTAL SURGERY    . EYE SURGERY Bilateral    cryoretinopexy    There were no vitals filed for this visit.      Subjective Assessment - 01/17/17 0943    Subjective shoulder has been doing really well. got a standing station at work, taking breaks for exercises. no spasms in last week. less stiff in am.    Currently in Pain? No/denies                         [facility] Adult PT Treatment/Exercise - 01/17/17 0001      Exercises   Exercises Lumbar     Lumbar Exercises: Standing   Other Standing Lumbar Exercises tilt board-focus on postural alignment and balance-static and dynamic     Lumbar Exercises: Supine   Other Supine Lumbar Exercises supine L position legs lower/lift     Lumbar Exercises: Quadruped   Plank on forearms-knees and toes   Other Quadruped Lumbar Exercises primal push ups: fwd/back shift, alt UE lift   Other Quadruped Lumbar Exercises side planks                PT Education - 01/17/17 1017    Education provided Yes   Education Details exercise form/rationale, postural alignment   Person(s) Educated Patient   Methods Explanation;Demonstration;Tactile cues;Verbal cues;Handout   Comprehension Verbalized understanding;Returned demonstration;Verbal cues required;Tactile cues required;Need further instruction          PT Short Term Goals - 01/13/17 1009      PT SHORT TERM GOAL #1   Title Pt will verbalize ability to utilize proper contractions in core and periscapular regions to provide proper postural support while working on computer by 6/1   Baseline Patient feels that she is more aware of posture but thinks she requires more time to get better about it.   Time 4   Period Weeks   Status Partially Met     PT SHORT TERM GOAL #2   Title Pt will demo proper static and dynamic UE OKC and CKC exercises to return to strengthening program to continue exercising following d/c   Baseline good form during planks   Status Partially Met     PT SHORT TERM GOAL #3   Title Pt will be able to lift pots, pans, plates etc overhead in the kitchen without increase in pain  to complete household chores   Baseline doesn't hurt anymore to do these things; still weaker   Status Achieved     PT SHORT TERM GOAL #4   Title Pt will be able to sleep with minimal interruption by R shoulder/arm pain   Status Achieved     PT SHORT TERM GOAL #5   Title FOTO to 66% ability to indicate significant improvement in functional ability   Status On-going                  Plan - 01/17/17 1200    Clinical Impression Statement Pt demo improved ability to perform postural scapular retraction but continues to stand with hips shifted antiorly utilizing FA Y-ligaments for stability resulting in difficulty to finish retraction movement. Utilized standing balance activities and planks to activation postural stabilizers along biomechanical chain and improve alignment. Will decrease PT frequency to 1/week  working toward d/c.    PT Next Visit Plan full body postural alignment challenges- planks, standing balance   PT Home Exercise Plan door pec stretch, supine thoracic extension over pillows, scapular retraction with abdominal engagement, serratus press at wall, rows and extension with theraband; ulnar nerve glides; qped: hip ext, rocking, ball circles, ER red tband; abd+ext behind back prone on ball and standing; planks, primal push ups   Consulted and Agree with Plan of Care Patient      Patient will benefit from skilled therapeutic intervention in order to improve the following deficits and impairments:     Visit Diagnosis: Chronic right shoulder pain  Cervicalgia  Muscle weakness (generalized)     Problem List Patient Active Problem List   Diagnosis Date Noted  . History of hyperlipidemia 11/08/2016  . History of anxiety 11/08/2016  . History of retinal tear 11/08/2016  . History of Bilateral plantar fasciitis 08/10/2016  . Plantar fasciitis 07/04/2016  . Erythema nodosum 07/04/2016  . High risk medication use 07/04/2016  . Lupus 05/23/2016  . 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[place]) 03/18/2016  . ADHD (attention deficit hyperactivity disorder) 12/22/2014  . Raynaud's phenomenon 10/17/2013  . Hyperlipidemia 09/28/2011   Raziah Funnell C. Didi Ganaway PT, DPT 01/17/17 12:04 PM   [facility] [address] [place], [place], [address] Phone: [phone]   Fax:  [phone]  Name: Robin [name] MRN: [id number] Date of Birth: May 28, 1971

## 2017-01-17 NOTE — Telephone Encounter (Signed)
10/2016 last ov 

## 2017-01-18 MED ORDER — AMPHETAMINE-DEXTROAMPHETAMINE 10 MG PO TABS: ORAL_TABLET | ORAL | 0 refills | Status: DC

## 2017-01-18 NOTE — Telephone Encounter (Signed)
Call -- adderall rx ready for pick up at [place] at [facility].

## 2017-01-20 ENCOUNTER — Ambulatory Visit: Payer: BLUE CROSS/BLUE SHIELD | Admitting: Physical Therapy

## 2017-01-23 ENCOUNTER — Ambulatory Visit: Payer: BLUE CROSS/BLUE SHIELD | Admitting: Physical Therapy

## 2017-01-24 ENCOUNTER — Encounter: Payer: PRIVATE HEALTH INSURANCE | Admitting: Physical Therapy

## 2017-01-27 ENCOUNTER — Ambulatory Visit: Payer: BLUE CROSS/BLUE SHIELD | Admitting: Physical Therapy

## 2017-01-30 ENCOUNTER — Other Ambulatory Visit: Payer: Self-pay | Admitting: Rheumatology

## 2017-01-30 NOTE — Telephone Encounter (Signed)
Last Visit: 11/09/16 Next Visit: 04/05/17 Labs: 11/09/16 WNL PLQ Eye Exam: 07/06/16 WNL  Okay to refill PLQ?

## 2017-01-30 NOTE — Telephone Encounter (Signed)
ok 

## 2017-01-31 ENCOUNTER — Ambulatory Visit: Payer: BLUE CROSS/BLUE SHIELD | Admitting: Physical Therapy

## 2017-02-03 ENCOUNTER — Encounter: Payer: Self-pay | Admitting: Physical Therapy

## 2017-02-03 ENCOUNTER — Ambulatory Visit: Payer: BLUE CROSS/BLUE SHIELD | Admitting: Physical Therapy

## 2017-02-03 DIAGNOSIS — M6281 Muscle weakness (generalized): Secondary | ICD-10-CM

## 2017-02-03 DIAGNOSIS — M25511 Pain in right shoulder: Secondary | ICD-10-CM | POA: Diagnosis not present

## 2017-02-03 DIAGNOSIS — G8929 Other chronic pain: Secondary | ICD-10-CM

## 2017-02-03 DIAGNOSIS — M542 Cervicalgia: Secondary | ICD-10-CM

## 2017-02-03 NOTE — Therapy (Signed)
[place], [place], [address] Phone: [phone]   Fax:  [phone]  Physical Therapy Treatment  Patient Details  Name: Robin [name] MRN: [id number] Date of Birth: 1971-04-06 Referring Provider: Bo Merino, MD  Encounter Date: 02/03/2017      PT End of Session - 02/03/17 1025    Visit Number 9   Number of Visits 15   Date for PT Re-Evaluation 02/10/17   Authorization Type [employer]- no visit limit   PT Start Time 1025   PT Stop Time 1101   PT Time Calculation (min) 36 min   Activity Tolerance Patient tolerated treatment well   Behavior During Therapy [facility] for tasks assessed/performed      Past Medical History:  Diagnosis Date  . ADD (attention deficit disorder)   . Allergy   . Anemia   . Anxiety   . Hyperlipidemia   . Ileitis   . Lupus     Past Surgical History:  Procedure Laterality Date  . CESAREAN SECTION     x 2  . DENTAL SURGERY    . EYE SURGERY Bilateral    cryoretinopexy    There were no vitals filed for this visit.      Subjective Assessment - 02/03/17 1025    Subjective Did a yoga class on Friday and had some soreness after, felt some discomfort along lateral arm. Has been doing planks.    Patient Stated Goals lifting overhead-in kitchen, sleep, doing dishes/cleaning   Currently in Pain? No/denies                         [facility] Adult PT Treatment/Exercise - 02/03/17 0001      Lumbar Exercises: Quadruped   Other Quadruped Lumbar Exercises FA plank- flexion reach, GHJ abduction,      Shoulder Exercises: Seated   Other Seated Exercises high kneeling hinge triceps kicks 2#     Shoulder Exercises: Prone   Other Prone Exercises primal push ups with variations of movement   Other Prone Exercises ext+abduction chest on physioball     Shoulder Exercises: Standing   Other Standing Exercises posture with abd behind back     Shoulder Exercises:  ROM/Strengthening   UBE (Upper Arm Bike) 3'/3' L2                PT Education - 02/03/17 1102    Education provided Yes   Education Details exercise form/rationale, importance of awareness, group exercise/training/other exercise options following d/c   Person(s) Educated Patient   Methods Explanation;Demonstration;Tactile cues;Verbal cues   Comprehension Verbalized understanding;Verbal cues required;Returned demonstration;Tactile cues required;Need further instruction          PT Short Term Goals - 01/13/17 1009      PT SHORT TERM GOAL #1   Title Pt will verbalize ability to utilize proper contractions in core and periscapular regions to provide proper postural support while working on computer by 6/1   Baseline Patient feels that she is more aware of posture but thinks she requires more time to get better about it.   Time 4   Period Weeks   Status Partially Met     PT SHORT TERM GOAL #2   Title Pt will demo proper static and dynamic UE OKC and CKC exercises to return to strengthening program to continue exercising following d/c   Baseline good form during planks   Status Partially Met     PT SHORT  TERM GOAL #3   Title Pt will be able to lift pots, pans, plates etc overhead in the kitchen without increase in pain to complete household chores   Baseline doesn't hurt anymore to do these things; still weaker   Status Achieved     PT SHORT TERM GOAL #4   Title Pt will be able to sleep with minimal interruption by R shoulder/arm pain   Status Achieved     PT SHORT TERM GOAL #5   Title FOTO to 66% ability to indicate significant improvement in functional ability   Status On-going                  Plan - 02/03/17 1103    Clinical Impression Statement Reviewed exercises today and provided options for increased challenge and incorporating other muscle groups. Will review next visit and d[facility]c to [facility] program. Pt enjoys body weight exercises so she can  easily do them while traveling.    PT Next Visit Plan review HEP   PT Home Exercise Plan door pec stretch, supine thoracic extension over pillows, scapular retraction with abdominal engagement, serratus press at wall, rows and extension with theraband; ulnar nerve glides; qped: hip ext, rocking, ball circles, ER red tband; abd+ext behind back prone on ball and standing; planks, primal push ups   Consulted and Agree with Plan of Care Patient      Patient will benefit from skilled therapeutic intervention in order to improve the following deficits and impairments:     Visit Diagnosis: Chronic right shoulder pain  Cervicalgia  Muscle weakness (generalized)     Problem List Patient Active Problem List   Diagnosis Date Noted  . History of hyperlipidemia 11/08/2016  . History of anxiety 11/08/2016  . History of retinal tear 11/08/2016  . History of Bilateral plantar fasciitis 08/10/2016  . Plantar fasciitis 07/04/2016  . Erythema nodosum 07/04/2016  . High risk medication use 07/04/2016  . Lupus 05/23/2016  . 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[place]) 03/18/2016  . ADHD (attention deficit hyperactivity disorder) 12/22/2014  . Raynaud's phenomenon 10/17/2013  . Hyperlipidemia 09/28/2011    Juanpablo Ciresi C. Taygen Acklin PT, DPT 02/03/17 11:06 AM   [facility] [address] [place], [place], [address] Phone: [phone]   Fax:  [phone]  Name: Robin [name] MRN: [id number] Date of Birth: December 29, 1970

## 2017-02-07 ENCOUNTER — Ambulatory Visit: Payer: BLUE CROSS/BLUE SHIELD | Admitting: Physical Therapy

## 2017-02-07 ENCOUNTER — Encounter: Payer: Self-pay | Admitting: Physical Therapy

## 2017-02-07 DIAGNOSIS — M25511 Pain in right shoulder: Principal | ICD-10-CM

## 2017-02-07 DIAGNOSIS — G8929 Other chronic pain: Secondary | ICD-10-CM

## 2017-02-07 DIAGNOSIS — M6281 Muscle weakness (generalized): Secondary | ICD-10-CM

## 2017-02-07 DIAGNOSIS — M542 Cervicalgia: Secondary | ICD-10-CM

## 2017-02-07 NOTE — Therapy (Signed)
[place], [place], [address] Phone: [phone]   Fax:  [phone]  Physical Therapy Treatment/Discharge Summary  Patient Details  Name: Robin [name] MRN: [id number] Date of Birth: 03-Apr-1971 Referring Provider: Bo Merino, MD  Encounter Date: 02/07/2017      PT End of Session - 02/07/17 0931    Visit Number 10   Number of Visits 15   Date for PT Re-Evaluation 02/10/17   Authorization Type [employer]- no visit limit   PT Start Time 0932   PT Stop Time 1008   PT Time Calculation (min) 36 min   Activity Tolerance Patient tolerated treatment well   Behavior During Therapy [facility] for tasks assessed/performed      Past Medical History:  Diagnosis Date  . ADD (attention deficit disorder)   . Allergy   . Anemia   . Anxiety   . Hyperlipidemia   . Ileitis   . Lupus     Past Surgical History:  Procedure Laterality Date  . CESAREAN SECTION     x 2  . DENTAL SURGERY    . EYE SURGERY Bilateral    cryoretinopexy    There were no vitals filed for this visit.      Subjective Assessment - 02/07/17 0932    Subjective Mild soreness along lateral arm after planks.    Patient Stated Goals lifting overhead-in kitchen, sleep, doing dishes/cleaning   Currently in Pain? No/denies            [facility] PT Assessment - 02/07/17 0001      Observation/Other Assessments   Focus on Therapeutic Outcomes (FOTO)  74% ability     Strength   Right Shoulder Horizontal ABduction 4+/5                     [facility] Adult PT Treatment/Exercise - 02/07/17 0001      Shoulder Exercises: Prone   Other Prone Exercises I,Y,T, row + triceps kick   Other Prone Exercises review of push ups and primal push up options, plank review     Shoulder Exercises: ROM/Strengthening   UBE (Upper Arm Bike) 3'/3' L2                PT Education - 02/07/17 1010    Education provided Yes   Education Details  progress with goals, independent program, exercise form/rationale and options.    Person(s) Educated Patient   Methods Explanation;Tactile cues;Verbal cues   Comprehension Verbalized understanding          PT Short Term Goals - 02/07/17 0935      PT SHORT TERM GOAL #1   Title Pt will verbalize ability to utilize proper contractions in core and periscapular regions to provide proper postural support while working on computer by 6/1   Baseline verbalized ability   Status Achieved     PT SHORT TERM GOAL #2   Title Pt will demo proper static and dynamic UE OKC and CKC exercises to return to strengthening program to continue exercising following d/c   Baseline good form, minimal cuing   Status Achieved     PT SHORT TERM GOAL #3   Title Pt will be able to lift pots, pans, plates etc overhead in the kitchen without increase in pain to complete household chores   Baseline much better, able to wipe  counter without pain   Status Achieved     PT SHORT TERM GOAL #4   Title Pt  will be able to sleep with minimal interruption by R shoulder/arm pain   Baseline able to sleep without waking up from pain   Status Achieved     PT SHORT TERM GOAL #5   Title FOTO to 66% ability to indicate significant improvement in functional ability   Baseline 74% ability   Status Achieved                  Plan - 02/07/17 1008    Clinical Impression Statement Pt has met all goals at this time and is d/c to independent program. Was able to demo  proper form with exercises and verbalize proper sensations. Was instructed to contact us with any further questions.    PT Treatment/Interventions ADLs/Self Care Home Management;Cryotherapy;Electrical Stimulation;Iontophoresis 80m/ml Dexamethasone;Functional mobility training;Ultrasound;Traction;Moist Heat;Therapeutic activities;Therapeutic exercise;Neuromuscular re-education;Patient/family education;Passive range of motion;Manual techniques;Dry needling;Taping    Consulted and Agree with Plan of Care Patient      Patient will benefit from skilled therapeutic intervention in order to improve the following deficits and impairments:  Impaired UE functional use, Increased muscle spasms, Decreased activity tolerance, Pain, Improper body mechanics, Decreased strength, Postural dysfunction  Visit Diagnosis: Chronic right shoulder pain  Cervicalgia  Muscle weakness (generalized)     Problem List Patient Active Problem List   Diagnosis Date Noted  . History of hyperlipidemia 11/08/2016  . History of anxiety 11/08/2016  . History of retinal tear 11/08/2016  . History of Bilateral plantar fasciitis 08/10/2016  . Plantar fasciitis 07/04/2016  . Erythema nodosum 07/04/2016  . High risk medication use 07/04/2016  . Lupus 05/23/2016  . 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 03/18[place]  . ADHD (attention deficit hyperactivity disorder) 12/22/2014  . Raynaud's phenomenon 10/17/2013  . Hyperlipidemia 09/28/2011   PHYSICAL THERAPY DISCHARGE SUMMARY  Visits from Start of Care: 10  Current functional level related to goals / functional outcomes: See above   Remaining deficits: See above   Education / Equipment: Anatomy of condition, POC, HEP, exercise form/rationale  Plan: Patient agrees to discharge.  Patient goals were met. Patient is being discharged due to meeting the stated rehab goals.  ?????    Tesneem Dufrane C. Ghazi Rumpf PT, DPT 02/07/17 10:12 AM    [id number] [address]Phone: [phone]  Fax:  [phone] Name: VJACQULINE TERRILLMRN: [id number] of Birth: 7October 10, 1972

## 2017-03-06 ENCOUNTER — Ambulatory Visit (INDEPENDENT_AMBULATORY_CARE_PROVIDER_SITE_OTHER): Payer: BLUE CROSS/BLUE SHIELD | Admitting: Physician Assistant

## 2017-03-06 ENCOUNTER — Encounter: Payer: Self-pay | Admitting: Physician Assistant

## 2017-03-06 VITALS — BP 112/71 | HR 75 | Temp 98.6°F | Resp 18 | Ht 66.5 in | Wt 146.4 lb

## 2017-03-06 DIAGNOSIS — B9789 Other viral agents as the cause of diseases classified elsewhere: Secondary | ICD-10-CM

## 2017-03-06 DIAGNOSIS — J069 Acute upper respiratory infection, unspecified: Secondary | ICD-10-CM

## 2017-03-06 MED ORDER — BENZONATATE 100 MG PO CAPS: 100.0000 mg | ORAL_CAPSULE | Freq: Three times a day (TID) | ORAL | 0 refills | Status: DC | PRN

## 2017-03-06 MED ORDER — AMOXICILLIN-POT CLAVULANATE 875-125 MG PO TABS: 1.0000 | ORAL_TABLET | Freq: Two times a day (BID) | ORAL | 0 refills | Status: DC

## 2017-03-06 MED ORDER — AZELASTINE HCL 0.1 % NA SOLN: 2.0000 | Freq: Two times a day (BID) | NASAL | 0 refills | Status: DC

## 2017-03-06 MED ORDER — GUAIFENESIN ER 1200 MG PO TB12: 1.0000 | ORAL_TABLET | Freq: Two times a day (BID) | ORAL | 1 refills | Status: DC | PRN

## 2017-03-06 NOTE — Progress Notes (Signed)
Patient ID: Robin [name], female    DOB: [birth year], 46 y.o.   MRN: [id number]  PCP: Wardell Honour, MD  Chief Complaint  Patient presents with  . Head congestion    x5 days, sore throat, sinus headaches, achy    Subjective:   Presents for evaluation of 5 days of sinus congestion.  Used to get frequent sinusitis, but only one time in the past 7 years (the recurrent infections stopped once a tooth was removed from the sinus cavity).  Chronic allergies have been worse this year.  She has Lupus. Takes Plaquenil. Required a prolonged course of systemic steroids last year (4 months).  Recently traveled to [place] for a conference.  Upon arrival, she developed symptoms: increased congestion, drainage, ear pressure, post-nasal draiange. The following day, she felt "achy and feverish."  She flew home yesterday and notes that the descent was particularly uncomfortable and her hearing was reduced for several hours after landing. She describes HA and facial pain, 7/10, "vice-like." Feels tired. Some chest tightness associated with cough.  Saline nasal rinse and sleeping propped up has helped some. Also using Theraflu, ibuprofen and Mucinex.   Review of Systems As above.    Patient Active Problem List   Diagnosis Date Noted  . History of hyperlipidemia 11/08/2016  . History of anxiety 11/08/2016  . History of retinal tear 11/08/2016  . History of Bilateral plantar fasciitis 08/10/2016  . Plantar fasciitis 07/04/2016  . Erythema nodosum 07/04/2016  . High risk medication use 07/04/2016  . Lupus 05/23/2016  . 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 ADHD (attention deficit hyperactivity disorder) 12/22/2014  . Raynaud's phenomenon 10/17/2013  . Hyperlipidemia 09/28/2011     Prior to Admission medications   Medication Sig Start Date End Date Taking? Authorizing Provider  acetaminophen (TYLENOL) 500 MG tablet Take 1,000 mg by mouth every 6 (six) hours as needed for mild  pain.   Yes [provider]  albuterol (PROVENTIL HFA;VENTOLIN HFA) 108 (90 Base) MCG/ACT inhaler Inhale 2 puffs into the lungs every 6 (six) hours as needed for wheezing or shortness of breath. 08/27/16  Yes Tereasa Coop, PA-C  amphetamine-dextroamphetamine (ADDERALL) 10 MG tablet Take 10 mg once a day to twice daily. 01/18/17  Yes Wardell Honour, MD  Boric Acid Topical POWD Place vaginally. 09/08/15  Yes [provider]  hydroxychloroquine (PLAQUENIL) 200 MG tablet TAKE 1 TABLET BY MOUTH TWICE DAILY( EVERY TWELVE HOURS) MONDAY- FRIDAY 01/30/17  Yes Deveshwar, Abel Presto, MD  loratadine (CLARITIN) 10 MG tablet Take 10 mg by mouth daily.   Yes [provider]  Omega 3-6-9 Fatty Acids (OMEGA-3-6-9 PO) Take 2 capsules by mouth daily.   Yes [provider]  Probiotic Product (PROBIOTIC COMPLEX ACIDOPHILUS PO) Take 1 capsule by mouth daily.    Yes [provider]  sodium chloride (OCEAN) 0.65 % nasal spray Place 1 spray into the nose daily as needed for congestion.    Yes [provider]  VENTOLIN HFA 108 (90 Base) MCG/ACT inhaler INHALE 2 PUFFS INTO THE LUNGS EVERY 6 HOURS AS NEEDED FOR WHEEZING OR SHORTNESS OF BREATH 08/29/16  Yes Copland, Gay Filler, MD     Allergies  Allergen Reactions  . Avelox [Moxifloxacin Hcl In Nacl] Other (See Comments)    Muscle cramps  . Cefaclor Hives    Tolerates penicillins  . Latex Other (See Comments)    "Raw skin"  . Meloxicam Rash  . Naproxen Rash  Objective:  Physical Exam  Constitutional: She is oriented to person, place, and time. She appears well-developed and well-nourished. No distress.  BP 112/71 (BP Location: Right Arm, Patient Position: Sitting, Cuff Size: Normal)   Pulse 75   Temp 98.6 F (37 C) (Oral)   Resp 18   Ht 5' 6.5" (1.689 m)   Wt 146 lb 6.4 oz (66.4 kg)   LMP 02/12/2017   SpO2 99%   BMI 23.28 kg/m    HENT:  Head: Normocephalic and atraumatic.  Right Ear: Hearing,  external ear and ear canal normal. Tympanic membrane is injected and retracted.  Left Ear: Hearing, external ear and ear canal normal. Tympanic membrane is injected and retracted.  Nose: Mucosal edema present. No rhinorrhea.  No foreign bodies. Right sinus exhibits no maxillary sinus tenderness and no frontal sinus tenderness. Left sinus exhibits no maxillary sinus tenderness and no frontal sinus tenderness.  Mouth/Throat: Uvula is midline, oropharynx is clear and moist and mucous membranes are normal. No uvula swelling. No oropharyngeal exudate.  Eyes: Conjunctivae and EOM are normal. Pupils are equal, round, and reactive to light. Right eye exhibits no discharge. Left eye exhibits no discharge. No scleral icterus.  Neck: Trachea normal, normal range of motion, full passive range of motion without pain and phonation normal. Neck supple. No thyroid mass and no thyromegaly present.  Cardiovascular: Normal rate, regular rhythm and normal heart sounds.   Pulmonary/Chest: Effort normal and breath sounds normal.  Lymphadenopathy:       Head (right side): No submandibular, no tonsillar, no preauricular, no posterior auricular and no occipital adenopathy present.       Head (left side): No submandibular, no tonsillar, no preauricular and no occipital adenopathy present.    She has no cervical adenopathy.       Right: No supraclavicular adenopathy present.       Left: No supraclavicular adenopathy present.  Neurological: She is alert and oriented to person, place, and time. She has normal strength. No cranial nerve deficit or sensory deficit.  Skin: Skin is warm, dry and intact. No rash noted.  Psychiatric: She has a normal mood and affect. Her speech is normal and behavior is normal.       Assessment & Plan:   1. Viral URI with cough Supportive care.  Anticipatory guidance.  RTC if symptoms worsen/persist. She is immunosuppressed. If no improvement in the next 48-72 hours, recommend starting  Augmentin. - azelastine (ASTELIN) 0.1 % nasal spray; Place 2 sprays into both nostrils 2 (two) times daily. Use in each nostril as directed  Dispense: 30 mL; Refill: 0 - Guaifenesin (MUCINEX MAXIMUM STRENGTH) 1200 MG TB12; Take 1 tablet (1,200 mg total) by mouth every 12 (twelve) hours as needed.  Dispense: 14 tablet; Refill: 1 - benzonatate (TESSALON) 100 MG capsule; Take 1-2 capsules (100-200 mg total) by mouth 3 (three) times daily as needed for cough.  Dispense: 40 capsule; Refill: 0 - amoxicillin-clavulanate (AUGMENTIN) 875-125 MG tablet; Take 1 tablet by mouth 2 (two) times daily.  Dispense: 20 tablet; Refill: 0    Return if symptoms worsen or fail to improve.   Fara Chute, PA-C [facility] [place]

## 2017-03-06 NOTE — Progress Notes (Signed)
Subjective:    Patient ID: Robin [name], female    DOB: [birth year], 46 y.o.   MRN: [id number]  HPI Robin [name] is a 46 year old female with a history of sinusitis who presents today "feeling ill."  On Thursday, patient left for [place] for a weekend conference. She felt congested Thursday night and tried Mucinex and saline rinses to help. She felt this helped with her congestion, but on Friday her symptoms worsened, and she developed a postnasal drip and sore throat. On Saturday, she started "feeling achey and feverish," although she did not have a thermometer to take her temperature. Today, she reports tightness in her chest, body aches, and a persistent cough. She also reports some congestion, mild ear pressure more so on the right than left, and a vise-like headache which is 7/10 at its worst. She denies chest pain or difficulty breathing. She denies chills but endorses fatigue and feels she was unable to get sufficient rest over the weekend due to the conference. The congestion would keep her up at night as well; she would try saline rinses and propping herself up in bed to help.  She is taking Theraflu, doing saline rinses, as well as taking ibuprofen for body aches. Her last dose of Mucinex was last night, Theraflu at 6AM today, and ibuprofen at 8:30AM today.  The patient reports that her allergies have been worse this year as well. She feels they worsened following a four-month course of steroids last year for a lupus flare.  Review of Systems See above.  Patient Active Problem List   Diagnosis Date Noted  . History of hyperlipidemia 11/08/2016  . History of anxiety 11/08/2016  . History of retinal tear 11/08/2016  . History of Bilateral plantar fasciitis 08/10/2016  . Plantar fasciitis 07/04/2016  . Erythema nodosum 07/04/2016  . High risk medication use 07/04/2016  . Lupus 05/23/2016  . 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 ADHD (attention deficit hyperactivity  disorder) 12/22/2014  . Raynaud's phenomenon 10/17/2013  . Hyperlipidemia 09/28/2011   Prior to Admission medications   Medication Sig Start Date End Date Taking? Authorizing Provider  acetaminophen (TYLENOL) 500 MG tablet Take 1,000 mg by mouth every 6 (six) hours as needed for mild pain.   Yes [provider]  albuterol (PROVENTIL HFA;VENTOLIN HFA) 108 (90 Base) MCG/ACT inhaler Inhale 2 puffs into the lungs every 6 (six) hours as needed for wheezing or shortness of breath. 08/27/16  Yes Tereasa Coop, PA-C  amphetamine-dextroamphetamine (ADDERALL) 10 MG tablet Take 10 mg once a day to twice daily. 01/18/17  Yes Wardell Honour, MD  Boric Acid Topical POWD Place vaginally. 09/08/15  Yes [provider]  hydroxychloroquine (PLAQUENIL) 200 MG tablet TAKE 1 TABLET BY MOUTH TWICE DAILY( EVERY TWELVE HOURS) MONDAY- FRIDAY 01/30/17  Yes Deveshwar, Abel Presto, MD  loratadine (CLARITIN) 10 MG tablet Take 10 mg by mouth daily.   Yes [provider]  Omega 3-6-9 Fatty Acids (OMEGA-3-6-9 PO) Take 2 capsules by mouth daily.   Yes [provider]  Probiotic Product (PROBIOTIC COMPLEX ACIDOPHILUS PO) Take 1 capsule by mouth daily.    Yes [provider]  sodium chloride (OCEAN) 0.65 % nasal spray Place 1 spray into the nose daily as needed for congestion.    Yes [provider]  VENTOLIN HFA 108 (90 Base) MCG/ACT inhaler INHALE 2 PUFFS INTO THE LUNGS EVERY 6 HOURS AS NEEDED FOR WHEEZING OR SHORTNESS OF BREATH 08/29/16  Yes Copland, Gay Filler, MD  cyclobenzaprine (FLEXERIL) 10 MG tablet Take 1 tablet (10 mg total) by mouth 3 (three) times daily as needed for muscle spasms. Patient not taking: Reported on 03/06/2017 11/02/16   McVey, Gelene Mink, PA-C  TURMERIC PO Take by mouth 4 (four) times daily.    [provider]   Allergies  Allergen Reactions  . Avelox [Moxifloxacin Hcl In Nacl] Other (See Comments)    Muscle cramps  . Cefaclor Hives     Tolerates penicillins  . Latex Other (See Comments)    "Raw skin"  . Meloxicam Rash  . Naproxen Rash      Objective:   Physical Exam  Constitutional: Vital signs are normal. She appears well-developed and well-nourished.  Patient sounds congested.  HENT:  Head: Normocephalic.  Right Ear: Tympanic membrane normal.  Left Ear: Tympanic membrane normal.  Nose: Nose normal.  External auditory canals erythematous bilaterally. Mild swelling of the canals noted as well, more so on right than left.   Cardiovascular: Normal rate, regular rhythm and normal heart sounds.   Pulses:      Radial pulses are 2+ on the right side, and 2+ on the left side.  Pulmonary/Chest: Effort normal and breath sounds normal. No respiratory distress. She has no wheezes. She has no rhonchi. She has no rales.  Lymphadenopathy:       Head (right side): No submental, no submandibular, no tonsillar, no preauricular, no posterior auricular and no occipital adenopathy present.       Head (left side): No submental, no submandibular, no tonsillar, no preauricular, no posterior auricular and no occipital adenopathy present.    She has no cervical adenopathy.       Right cervical: No superficial cervical, no deep cervical and no posterior cervical adenopathy present.      Left cervical: No superficial cervical, no deep cervical and no posterior cervical adenopathy present.  Skin: Skin is warm and dry.  Psychiatric: She has a normal mood and affect. Her speech is normal and behavior is normal.       Assessment & Plan:   1. Viral URI with cough Rx Astelin, Mucinex, and Tessalon for symptomatic treatment. Rx augmentin. Instructed patient to take if she feels no improvement in 48 hours. Advised she increase fluid intake and rest to aid in her recovery. Follow up if symptoms do not resolve. - azelastine (ASTELIN) 0.1 % nasal spray; Place 2 sprays into both nostrils 2 (two) times daily. Use in each nostril as directed   Dispense: 30 mL; Refill: 0 - Guaifenesin (MUCINEX MAXIMUM STRENGTH) 1200 MG TB12; Take 1 tablet (1,200 mg total) by mouth every 12 (twelve) hours as needed.  Dispense: 14 tablet; Refill: 1 - benzonatate (TESSALON) 100 MG capsule; Take 1-2 capsules (100-200 mg total) by mouth 3 (three) times daily as needed for cough.  Dispense: 40 capsule; Refill: 0 - amoxicillin-clavulanate (AUGMENTIN) 875-125 MG tablet; Take 1 tablet by mouth 2 (two) times daily.  Dispense: 20 tablet; Refill: 0

## 2017-03-06 NOTE — Patient Instructions (Addendum)
Fill the prescription for the antibiotic if your symptoms worsen or persist beyond day 7 of this illness.    IF you received an x-ray today, you will receive an invoice from [facility]. Please contact [facility] at [phone] with questions or concerns regarding your invoice.   IF you received labwork today, you will receive an invoice from [place]. Please contact [facility] at [phone] with questions or concerns regarding your invoice.   Our billing staff will not be able to assist you with questions regarding bills from these companies.  You will be contacted with the lab results as soon as they are available. The fastest way to get your results is to activate your My Chart account. Instructions are located on the last page of this paperwork. If you have not heard from [place] regarding the results in 2 weeks, please contact this office.

## 2017-03-08 ENCOUNTER — Other Ambulatory Visit: Payer: Self-pay | Admitting: *Deleted

## 2017-03-08 MED ORDER — HYDROXYCHLOROQUINE SULFATE 200 MG PO TABS: ORAL_TABLET | ORAL | 2 refills | Status: DC

## 2017-03-08 NOTE — Telephone Encounter (Signed)
Last Visit: 11/09/16 Next Visit: 04/05/17 Labs: 11/09/16 WNL PLQ Eye Exam: 07/06/16 WNL  Okay to refill per Dr. Estanislado Pandy

## 2017-03-28 ENCOUNTER — Other Ambulatory Visit: Payer: Self-pay

## 2017-03-28 DIAGNOSIS — Z79899 Other long term (current) drug therapy: Secondary | ICD-10-CM

## 2017-03-28 LAB — CBC WITH DIFFERENTIAL/PLATELET
Basophils Absolute: 0 cells/uL (ref 0–200)
Basophils Relative: 0 %
Eosinophils Absolute: 86 cells/uL (ref 15–500)
Eosinophils Relative: 2 %
HCT: 42.1 % (ref 35.0–45.0)
Hemoglobin: 13.6 g/dL (ref 11.7–15.5)
Lymphocytes Relative: 29 %
Lymphs Abs: 1247 cells/uL (ref 850–3900)
MCH: 30 pg (ref 27.0–33.0)
MCHC: 32.3 g/dL (ref 32.0–36.0)
MCV: 92.9 fL (ref 80.0–100.0)
MPV: 9.8 fL (ref 7.5–12.5)
Monocytes Absolute: 473 cells/uL (ref 200–950)
Monocytes Relative: 11 %
Neutro Abs: 2494 cells/uL (ref 1500–7800)
Neutrophils Relative %: 58 %
Platelets: 248 10*3/uL (ref 140–400)
RBC: 4.53 MIL/uL (ref 3.80–5.10)
RDW: 13.7 % (ref 11.0–15.0)
WBC: 4.3 10*3/uL (ref 3.8–10.8)

## 2017-03-28 LAB — COMPLETE METABOLIC PANEL WITH GFR
ALT: 9 U/L (ref 6–29)
AST: 17 U/L (ref 10–30)
Albumin: 4.6 g/dL (ref 3.6–5.1)
Alkaline Phosphatase: 28 U/L — ABNORMAL LOW (ref 33–115)
BUN: 12 mg/dL (ref 7–25)
CO2: 23 mmol/L (ref 20–31)
Calcium: 9.8 mg/dL (ref 8.6–10.2)
Chloride: 101 mmol/L (ref 98–110)
Creat: 0.91 mg/dL (ref 0.50–1.10)
GFR, Est African American: 89 mL/min (ref >=60)
GFR, Est Non African American: 78 mL/min (ref >=60)
Glucose, Bld: 77 mg/dL (ref 65–99)
Potassium: 4.8 mmol/L (ref 3.5–5.3)
Sodium: 135 mmol/L (ref 135–146)
Total Bilirubin: 0.7 mg/dL (ref 0.2–1.2)
Total Protein: 7.3 g/dL (ref 6.1–8.1)

## 2017-03-29 NOTE — Progress Notes (Signed)
WNL

## 2017-03-30 NOTE — Progress Notes (Signed)
Office Visit Note  Patient: Robin [name]             Date of Birth: Jan 20, 1971           MRN: [id number]             PCP: Wardell Honour, MD Referring: Wardell Honour, MD Visit Date: 04/05/2017 Occupation: _0 @    Subjective:  Follow-up (5 month lupus follow up. Doing well. Has been having increased dizziness and fatigue, increased muscle soreness. )   History of Present Illness: Robin [name] is a 46 y.o. female with history of systemic lupus or dermatosis. She states she's been doing better in the last visit. She still has off and on sores in her mouth. She also had recent sunburn when she did not use the sunscreen. Her Raynauds is not as noticeable during the summertime. She has not had any recent lesions of erythema nodosum. She denies any joint swelling. She's been working out on regular basis. She states after workout she experiences increased muscle pain and sometimes dizziness. She's been under a lot of stress. And she reports insomnia.  Activities of Daily Living:  Patient reports morning stiffness for 0 minute.   Patient Denies nocturnal pain.  Difficulty dressing/grooming: Denies Difficulty climbing stairs: Denies Difficulty getting out of chair: Denies Difficulty using hands for taps, buttons, cutlery, and/or writing: Denies   Review of Systems  Constitutional: Positive for fatigue. Negative for night sweats, weight gain, weight loss and weakness.  HENT: Positive for mouth sores. Negative for trouble swallowing, trouble swallowing, mouth dryness and nose dryness.   Eyes: Negative for pain, redness, visual disturbance and dryness.  Respiratory: Negative for cough, shortness of breath and difficulty breathing.   Cardiovascular: Negative for chest pain, palpitations, hypertension, irregular heartbeat and swelling in legs/feet.  Gastrointestinal: Negative for blood in stool, constipation and diarrhea.  Endocrine: Negative for increased urination.    Genitourinary: Negative for vaginal dryness.  Musculoskeletal: Positive for myalgias and myalgias. Negative for arthralgias, joint pain, joint swelling, muscle weakness, morning stiffness and muscle tenderness.  Skin: Positive for color change and sensitivity to sunlight. Negative for rash, hair loss, skin tightness and ulcers.  Allergic/Immunologic: Negative for susceptible to infections.  Neurological: Negative for dizziness, memory loss and night sweats.  Hematological: Negative for swollen glands.  Psychiatric/Behavioral: Positive for depressed mood and sleep disturbance. The patient is nervous/anxious.     PMFS History:  Patient Active Problem List   Diagnosis Date Noted  . History of hyperlipidemia 11/08/2016  . History of anxiety 11/08/2016  . History of retinal tear 11/08/2016  . History of Bilateral plantar fasciitis 08/10/2016  . Plantar fasciitis 07/04/2016  . Erythema nodosum 07/04/2016  . High risk medication use 07/04/2016  . Lupus 05/23/2016  . 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[place]) 03/18/2016  . ADHD (attention deficit hyperactivity disorder) 12/22/2014  . Raynaud's phenomenon 10/17/2013  . Hyperlipidemia 09/28/2011    Past Medical History:  Diagnosis Date  . ADD (attention deficit disorder)   . Allergy   . Anemia   . Anxiety   . Hyperlipidemia   . Ileitis   . Lupus     Family History  Problem Relation Age of Onset  . Heart disease Mother        CAD/stenting  . Hyperlipidemia Mother   . Arthritis Mother   . Hypertension Mother   . Kidney disease Mother   . Irritable bowel syndrome Mother   . Stroke Mother   . Hyperlipidemia Father   .  Hyperlipidemia Sister   . Colon cancer Neg Hx   . Inflammatory bowel disease Neg Hx    Past Surgical History:  Procedure Laterality Date  . CESAREAN SECTION     x 2  . DENTAL SURGERY    . EYE SURGERY Bilateral    cryoretinopexy   Social History   Social History Narrative   Marital status: married      Children: 2 children  (14, 58)      Employment:      Objective: Vital Signs: BP 105/64 (BP Location: Left Arm, Patient Position: Sitting, Cuff Size: Normal)   Pulse 100   Ht 5' 6.5" (1.689 m)   Wt 146 lb 8 oz (66.5 kg)   BMI 23.29 kg/m    Physical Exam  Constitutional: She is oriented to person, place, and time. She appears well-developed and well-nourished.  HENT:  Head: Normocephalic and atraumatic.  Eyes: Conjunctivae and EOM are normal.  Neck: Normal range of motion.  Cardiovascular: Normal rate, regular rhythm, normal heart sounds and intact distal pulses.   Pulmonary/Chest: Effort normal and breath sounds normal.  Abdominal: Soft. Bowel sounds are normal.  Lymphadenopathy:    She has no cervical adenopathy.  Neurological: She is alert and oriented to person, place, and time.  Skin: Skin is warm and dry. Capillary refill takes 2 to 3 seconds.  Psychiatric: She has a normal mood and affect. Her behavior is normal.  Nursing note and vitals reviewed.    Musculoskeletal Exam: C-spine and thoracic lumbar spine good range of motion. Shoulder joints elbow joints wrist joints are good range of motion. No synovitis was noted. Hip joints knee joints ankles MTPs PIPs were good range of motion with no synovitis.  CDAI Exam: CDAI Homunculus Exam:   Joint Counts:  CDAI Tender Joint count: 0 CDAI Swollen Joint count: 0  Global Assessments:  Patient Global Assessment: 1 Provider Global Assessment: 1  CDAI Calculated Score: 2    Investigation: No additional findings. CBC Latest Ref Rng & Units 03/28/2017 11/09/2016 08/11/2016  WBC 3.8 - 10.8 K/uL 4.3 4.2 4.4  Hemoglobin 11.7 - 15.5 g/dL 13.6 13.2 12.4  Hematocrit 35.0 - 45.0 % 42.1 40.1 38.4  Platelets 140 - 400 K/uL 248 222 280    CMP Latest Ref Rng & Units 03/28/2017 11/09/2016 08/11/2016  Glucose 65 - 99 mg/dL 77 77 64(L)  BUN 7 - 25 mg/dL _0 Creatinine 0.50 - 1.10 mg/dL 0.91 0.86 0.89  Sodium 135 - 146 mmol/L 135 138 138  Potassium  3.5 - 5.3 mmol/L 4.8 4.4 4.3  Chloride 98 - 110 mmol/L 101 106 104  CO2 20 - 31 mmol/L _1 Calcium 8.6 - 10.2 mg/dL 9.8 9.4 9.2  Total Protein 6.1 - 8.1 g/dL 7.3 7.0 6.8  Total Bilirubin 0.2 - 1.2 mg/dL 0.7 0.7 0.4  Alkaline Phos 33 - 115 U/L 28(L) 23(L) 22(L)  AST 10 - 30 U/L _2 ALT 6 - 29 U/L _3 02/28/ 2018 ESR 1, C3-C4 normal, DS DNA less than 1, UA negative Eye exam 09/2016 Imaging: No results found.  Speciality Comments: No specialty comments available.    Procedures:  No procedures performed Allergies: Avelox [moxifloxacin hcl in nacl]; Cefaclor; Latex; Meloxicam; and Naproxen   Assessment / Plan:     Visit Diagnoses: Other systemic lupus erythematosus - ANA 1:320 speckled, history of oral ulcers, lymphadenopathy, photosensitivity, Raynauds, erythema nodosum, inflammatory arthritis -Patient reports that  she has been experiencing increased fatigue, myalgias and few oral ulcers. Plan: Anti-DNA antibody, double-stranded, C3 and C4, Sedimentation rate  High risk medication use - Plaquenil 200 mg by mouth twice a day Monday to Friday. Her labs and eye exams have been normal. I will refill her Plaquenil today.  Raynaud's phenomenon without gangrene: Raynolds is not very active during the summertime.  Other fatigue - Plan: CK, TSH, VITAMIN D 25 Hydroxy (Vit-D Deficiency, Fractures), Serum protein electrophoresis with reflex.  Dizziness: Episodic. I've advised patient in case she is persistent symptoms she may   Erythema nodosum: She has no active EN lesions.  Plantar fasciitis: It is not causing much discomfort currently.  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 CK  . TSH  . VITAMIN D 25 Hydroxy (Vit-D Deficiency, Fractures)  . Anti-DNA  antibody, double-stranded  . C3 and C4  . Sedimentation rate  . Serum protein electrophoresis with reflex   Meds ordered this encounter  Medications  . hydroxychloroquine (PLAQUENIL) 200 MG tablet    Sig: TAKE 1 TABLET BY MOUTH TWICE DAILY( EVERY TWELVE HOURS) MONDAY- FRIDAY    Dispense:  40 tablet    Refill:  2    Face-to-face time spent with patient was 30 minutes. 50% of time was spent in counseling and coordination of care.  Follow-Up Instructions: Return in about 5 months (around 09/05/2017) for Systemic lupus.   Bo Merino, MD  Note - This record has been created using Editor, commissioning.  Chart creation errors have been sought, but may not always  have been located. Such creation errors do not reflect on  the standard of medical care.

## 2017-03-31 ENCOUNTER — Telehealth: Payer: Self-pay | Admitting: Family Medicine

## 2017-04-05 ENCOUNTER — Ambulatory Visit (INDEPENDENT_AMBULATORY_CARE_PROVIDER_SITE_OTHER): Payer: BLUE CROSS/BLUE SHIELD | Admitting: Rheumatology

## 2017-04-05 ENCOUNTER — Encounter: Payer: Self-pay | Admitting: Rheumatology

## 2017-04-05 VITALS — BP 105/64 | HR 100 | Ht 66.5 in | Wt 146.5 lb

## 2017-04-05 DIAGNOSIS — I7300 Raynaud's syndrome without gangrene: Secondary | ICD-10-CM | POA: Diagnosis not present

## 2017-04-05 DIAGNOSIS — Z8659 Personal history of other mental and behavioral disorders: Secondary | ICD-10-CM | POA: Diagnosis not present

## 2017-04-05 DIAGNOSIS — K5000 Crohn's disease of small intestine without complications: Secondary | ICD-10-CM

## 2017-04-05 DIAGNOSIS — Z79899 Other long term (current) drug therapy: Secondary | ICD-10-CM | POA: Diagnosis not present

## 2017-04-05 DIAGNOSIS — Z8639 Personal history of other endocrine, nutritional and metabolic disease: Secondary | ICD-10-CM

## 2017-04-05 DIAGNOSIS — M3219 Other organ or system involvement in systemic lupus erythematosus: Secondary | ICD-10-CM

## 2017-04-05 DIAGNOSIS — L52 Erythema nodosum: Secondary | ICD-10-CM

## 2017-04-05 DIAGNOSIS — Z8669 Personal history of other diseases of the nervous system and sense organs: Secondary | ICD-10-CM

## 2017-04-05 DIAGNOSIS — R5383 Other fatigue: Secondary | ICD-10-CM

## 2017-04-05 DIAGNOSIS — F900 Attention-deficit hyperactivity disorder, predominantly inattentive type: Secondary | ICD-10-CM

## 2017-04-05 DIAGNOSIS — M722 Plantar fascial fibromatosis: Secondary | ICD-10-CM

## 2017-04-05 LAB — TSH: TSH: 1.76 mIU/L

## 2017-04-05 MED ORDER — HYDROXYCHLOROQUINE SULFATE 200 MG PO TABS: ORAL_TABLET | ORAL | 2 refills | Status: DC

## 2017-04-06 LAB — C3 AND C4
C3 Complement: 82 mg/dL — ABNORMAL LOW (ref 83–193)
C4 Complement: 21 mg/dL (ref 15–57)

## 2017-04-06 LAB — VITAMIN D 25 HYDROXY (VIT D DEFICIENCY, FRACTURES): Vit D, 25-Hydroxy: 31 ng/mL (ref 30–100)

## 2017-04-06 LAB — ANTI-DNA ANTIBODY, DOUBLE-STRANDED: ds DNA Ab: <1 IU/mL

## 2017-04-06 LAB — CK: Total CK: 57 U/L (ref 29–143)

## 2017-04-06 LAB — SEDIMENTATION RATE: Sed Rate: 1 mm/hr (ref 0–20)

## 2017-04-10 LAB — PROTEIN ELECTROPHORESIS, SERUM, WITH REFLEX
Albumin ELP: 4.7 g/dL (ref 3.8–4.8)
Alpha-1-Globulin: 0.3 g/dL (ref 0.2–0.3)
Alpha-2-Globulin: 0.6 g/dL (ref 0.5–0.9)
Beta 2: 0.3 g/dL (ref 0.2–0.5)
Beta Globulin: 0.5 g/dL (ref 0.4–0.6)
Gamma Globulin: 1.1 g/dL (ref 0.8–1.7)
Total Protein, Serum Electrophoresis: 7.4 g/dL (ref 6.1–8.1)

## 2017-04-10 NOTE — Progress Notes (Signed)
Labs are stable. Advise Vit D 2000 U qd OTC.

## 2017-04-13 ENCOUNTER — Telehealth: Payer: Self-pay | Admitting: Family Medicine

## 2017-04-13 NOTE — Telephone Encounter (Signed)
Clerical staff made aware that this is not possible.

## 2017-04-13 NOTE — Telephone Encounter (Signed)
PATIENT STATES WE CALLED HER TO CANCEL HER APPOINTMENT WITH DR. Tamala Julian ON 04/18/2017. SHE CALLED TO RESCHEDULE. SHE WAS DOWN TO GET HER MEDICATION REFILLS. SHE STATED SHE ALSO WAS SENT A NOTE IN MY-CHART THAT SHE NEEDS TO GET HER PAP SMEAR, PNEUMONIA SHOT AND FLU SHOT. SHE WANTED TO GET ALL THIS DONE AT THE SAME TIME. (I EXPLAINED OUR POLICY REGARDING 1 PROBLEM PER VISIT). SHE STILL WANTS IT DONE AT THE SAME TIME. SHANNON TOLD ME TO PUT A MESSAGE IN FOR DR. Tamala Julian TO DECIDE. BEST PHONE [phone] (CELL)  [place]

## 2017-04-18 ENCOUNTER — Ambulatory Visit: Payer: Managed Care, Other (non HMO) | Admitting: Family Medicine

## 2017-04-19 ENCOUNTER — Ambulatory Visit (INDEPENDENT_AMBULATORY_CARE_PROVIDER_SITE_OTHER): Payer: BLUE CROSS/BLUE SHIELD | Admitting: Family Medicine

## 2017-04-19 ENCOUNTER — Encounter: Payer: Self-pay | Admitting: Family Medicine

## 2017-04-19 VITALS — BP 109/70 | HR 93 | Temp 97.6°F | Resp 18 | Ht 65.75 in | Wt 143.0 lb

## 2017-04-19 DIAGNOSIS — F900 Attention-deficit hyperactivity disorder, predominantly inattentive type: Secondary | ICD-10-CM | POA: Diagnosis not present

## 2017-04-19 DIAGNOSIS — M3219 Other organ or system involvement in systemic lupus erythematosus: Secondary | ICD-10-CM

## 2017-04-19 DIAGNOSIS — I7300 Raynaud's syndrome without gangrene: Secondary | ICD-10-CM

## 2017-04-19 DIAGNOSIS — Z0000 Encounter for general adult medical examination without abnormal findings: Secondary | ICD-10-CM | POA: Diagnosis not present

## 2017-04-19 DIAGNOSIS — Z1239 Encounter for other screening for malignant neoplasm of breast: Secondary | ICD-10-CM

## 2017-04-19 DIAGNOSIS — Z8659 Personal history of other mental and behavioral disorders: Secondary | ICD-10-CM

## 2017-04-19 DIAGNOSIS — Z124 Encounter for screening for malignant neoplasm of cervix: Secondary | ICD-10-CM

## 2017-04-19 DIAGNOSIS — Z760 Encounter for issue of repeat prescription: Secondary | ICD-10-CM

## 2017-04-19 DIAGNOSIS — Z1231 Encounter for screening mammogram for malignant neoplasm of breast: Secondary | ICD-10-CM

## 2017-04-19 DIAGNOSIS — E7800 Pure hypercholesterolemia, unspecified: Secondary | ICD-10-CM | POA: Diagnosis not present

## 2017-04-19 DIAGNOSIS — Z8669 Personal history of other diseases of the nervous system and sense organs: Secondary | ICD-10-CM | POA: Diagnosis not present

## 2017-04-19 MED ORDER — AMPHETAMINE-DEXTROAMPHETAMINE 10 MG PO TABS: ORAL_TABLET | ORAL | 0 refills | Status: DC

## 2017-04-19 NOTE — Patient Instructions (Addendum)
Robin Chimes, MD --- CONSULTATION FOR IUD/INTRAUTERINE DEVICE.   Preventive Care 40-64 Years, Female Preventive care refers to lifestyle choices and visits with your health care provider that can promote health and wellness. What does preventive care include?  A yearly physical exam. This is also called an annual well check.  Dental exams once or twice a year.  Routine eye exams. Ask your health care provider how often you should have your eyes checked.  Personal lifestyle choices, including: ? Daily care of your teeth and gums. ? Regular physical activity. ? Eating a healthy diet. ? Avoiding tobacco and drug use. ? Limiting alcohol use. ? Practicing safe sex. ? Taking low-dose aspirin daily starting at age 83. ? Taking vitamin and mineral supplements as recommended by your health care provider. What happens during an annual well check? The services and screenings done by your health care provider during your annual well check will depend on your age, overall health, lifestyle risk factors, and family history of disease. Counseling Your health care provider may ask you questions about your:  Alcohol use.  Tobacco use.  Drug use.  Emotional well-being.  Home and relationship well-being.  Sexual activity.  Eating habits.  Work and work Statistician.  Method of birth control.  Menstrual cycle.  Pregnancy history.  Screening You may have the following tests or measurements:  Height, weight, and BMI.  Blood pressure.  Lipid and cholesterol levels. These may be checked every 5 years, or more frequently if you are over 90 years old.  Skin check.  Lung cancer screening. You may have this screening every year starting at age 48 if you have a 30-pack-year history of smoking and currently smoke or have quit within the past 15 years.  Fecal occult blood test (FOBT) of the stool. You may have this test every year starting at age 85.  Flexible sigmoidoscopy or  colonoscopy. You may have a sigmoidoscopy every 5 years or a colonoscopy every 10 years starting at age 74.  Hepatitis C blood test.  Hepatitis B blood test.  Sexually transmitted disease (STD) testing.  Diabetes screening. This is done by checking your blood sugar (glucose) after you have not eaten for a while (fasting). You may have this done every 1-3 years.  Mammogram. This may be done every 1-2 years. Talk to your health care provider about when you should start having regular mammograms. This may depend on whether you have a family history of breast cancer.  BRCA-related cancer screening. This may be done if you have a family history of breast, ovarian, tubal, or peritoneal cancers.  Pelvic exam and Pap test. This may be done every 3 years starting at age 93. Starting at age 51, this may be done every 5 years if you have a Pap test in combination with an HPV test.  Bone density scan. This is done to screen for osteoporosis. You may have this scan if you are at high risk for osteoporosis.  Discuss your test results, treatment options, and if necessary, the need for more tests with your health care provider. Vaccines Your health care provider may recommend certain vaccines, such as:  Influenza vaccine. This is recommended every year.  Tetanus, diphtheria, and acellular pertussis (Tdap, Td) vaccine. You may need a Td booster every 10 years.  Varicella vaccine. You may need this if you have not been vaccinated.  Zoster vaccine. You may need this after age 16.  Measles, mumps, and rubella (MMR) vaccine. You may need at  least one dose of MMR if you were born in 1957 or later. You may also need a second dose.  Pneumococcal 13-valent conjugate (PCV13) vaccine. You may need this if you have certain conditions and were not previously vaccinated.  Pneumococcal polysaccharide (PPSV23) vaccine. You may need one or two doses if you smoke cigarettes or if you have certain  conditions.  Meningococcal vaccine. You may need this if you have certain conditions.  Hepatitis A vaccine. You may need this if you have certain conditions or if you travel or work in places where you may be exposed to hepatitis A.  Hepatitis B vaccine. You may need this if you have certain conditions or if you travel or work in places where you may be exposed to hepatitis B.  Haemophilus influenzae type b (Hib) vaccine. You may need this if you have certain conditions.  Talk to your health care provider about which screenings and vaccines you need and how often you need them. This information is not intended to replace advice given to you by your health care provider. Make sure you discuss any questions you have with your health care provider. Document Released: 09/25/2015 Document Revised: 05/18/2016 Document Reviewed: 06/30/2015 Elsevier Interactive Patient Education  2017 [employer].    IF you received an x-ray today, you will receive an invoice from [facility]. Please contact [facility] at [phone] with questions or concerns regarding your invoice.   IF you received labwork today, you will receive an invoice from [facility]. Please contact [facility] at [phone] with questions or concerns regarding your invoice.   Our billing staff will not be able to assist you with questions regarding bills from these companies.  You will be contacted with the lab results as soon as they are available. The fastest way to get your results is to activate your My Chart account. Instructions are located on the last page of this paperwork. If you have not heard from [place] regarding the results in 2 weeks, please contact this office.

## 2017-04-19 NOTE — Progress Notes (Signed)
Subjective:    Patient ID: Robin [name], female    DOB: 27-Jan-1971, 46 y.o.   MRN: [id number]  04/19/2017  Hyperlipidemia (6 month follow-up and PAP) and ADHD   HPI This 46 y.o. female presents for Routine Physical Examination.  Last physical:  2015 Pap smear:10/17/2013 Mammogram:  2010 Colonoscopy:  04/07/2016; not Crohn's disease; lesion; improved  Now. Eye exam: 10/04/2016 Dental exam:  Appointment this month.  BP Readings from Last 3 Encounters:  04/19/17 109/70  04/05/17 105/64  03/06/17 112/71   Wt Readings from Last 3 Encounters:  04/19/17 143 lb (64.9 kg)  04/05/17 146 lb 8 oz (66.5 kg)  03/06/17 146 lb 6.4 oz (66.4 kg)   Immunization History  Administered Date(s) Administered  . Influenza,inj,Quad PF,6+ Mos 05/12/2014, 11/27/2015, 05/23/2016  . Pneumococcal Conjugate-13 05/23/2016  . Tdap 09/28/2011    Allergies: Claritinl Abluterol rarely.    ADHD: Adderall 66m daily.  SLE: Plaquenil daily.  Review of Systems  Constitutional: Negative for activity change, appetite change, chills, diaphoresis, fatigue, fever and unexpected weight change.  HENT: Negative for congestion, dental problem, drooling, ear discharge, ear pain, facial swelling, hearing loss, mouth sores, nosebleeds, postnasal drip, rhinorrhea, sinus pressure, sneezing, sore throat, tinnitus, trouble swallowing and voice change.   Eyes: Negative for photophobia, pain, discharge, redness, itching and visual disturbance.  Respiratory: Negative for apnea, cough, choking, chest tightness, shortness of breath, wheezing and stridor.   Cardiovascular: Negative for chest pain, palpitations and leg swelling.  Gastrointestinal: Negative for abdominal distention, abdominal pain, anal bleeding, blood in stool, constipation, diarrhea, nausea, rectal pain and vomiting.  Endocrine: Negative for cold intolerance, heat intolerance, polydipsia, polyphagia and polyuria.  Genitourinary: Negative for decreased urine  volume, difficulty urinating, dyspareunia, dysuria, enuresis, flank pain, frequency, genital sores, hematuria, menstrual problem, pelvic pain, urgency, vaginal bleeding, vaginal discharge and vaginal pain.  Musculoskeletal: Negative for arthralgias, back pain, gait problem, joint swelling, myalgias, neck pain and neck stiffness.  Skin: Negative for color change, pallor, rash and wound.  Allergic/Immunologic: Negative for environmental allergies, food allergies and immunocompromised state.  Neurological: Negative for dizziness, tremors, seizures, syncope, facial asymmetry, speech difficulty, weakness, light-headedness, numbness and headaches.  Hematological: Negative for adenopathy. Does not bruise/bleed easily.  Psychiatric/Behavioral: Negative for agitation, behavioral problems, confusion, decreased concentration, dysphoric mood, hallucinations, self-injury, sleep disturbance and suicidal ideas. The patient is not nervous/anxious and is not hyperactive.     Past Medical History:  Diagnosis Date  . ADD (attention deficit disorder)   . Allergy   . Anemia   . Anxiety   . Hyperlipidemia   . Ileitis   . Lupus    Past Surgical History:  Procedure Laterality Date  . CESAREAN SECTION     x 2  . DENTAL SURGERY    . EYE SURGERY Bilateral    cryoretinopexy   Allergies  Allergen Reactions  . Avelox [Moxifloxacin Hcl In Nacl] Other (See Comments)    Muscle cramps  . Cefaclor Hives    Tolerates penicillins  . Latex Other (See Comments)    "Raw skin"  . Meloxicam Rash  . Naproxen Rash   Current Outpatient Prescriptions  Medication Sig Dispense Refill  . acetaminophen (TYLENOL) 500 MG tablet Take 1,000 mg by mouth every 6 (six) hours as needed for mild pain.    .Marland Kitchenalbuterol (PROVENTIL HFA;VENTOLIN HFA) 108 (90 Base) MCG/ACT inhaler Inhale 2 puffs into the lungs every 6 (six) hours as needed for wheezing or shortness of breath. 1 Inhaler 0  .  amphetamine-dextroamphetamine (ADDERALL) 10 MG  tablet Take 10 mg once a day to twice daily. 60 tablet 0  . azelastine (ASTELIN) 0.1 % nasal spray Place 2 sprays into both nostrils 2 (two) times daily. Use in each nostril as directed 30 mL 0  . Boric Acid Topical POWD Place vaginally.    . hydroxychloroquine (PLAQUENIL) 200 MG tablet TAKE 1 TABLET BY MOUTH TWICE DAILY( EVERY TWELVE HOURS) MONDAY- FRIDAY 40 tablet 2  . loratadine (CLARITIN) 10 MG tablet Take 10 mg by mouth daily.    Ernestine Conrad 3-6-9 Fatty Acids (OMEGA-3-6-9 PO) Take 2 capsules by mouth daily.    . Probiotic Product (PROBIOTIC COMPLEX ACIDOPHILUS PO) Take 1 capsule by mouth daily.     . sodium chloride (OCEAN) 0.65 % nasal spray Place 1 spray into the nose daily as needed for congestion.     . TURMERIC PO Take by mouth 4 (four) times daily.    . VENTOLIN HFA 108 (90 Base) MCG/ACT inhaler INHALE 2 PUFFS INTO THE LUNGS EVERY 6 HOURS AS NEEDED FOR WHEEZING OR SHORTNESS OF BREATH 18 g 0   Current Facility-Administered Medications  Medication Dose Route Frequency Provider Last Rate Last Dose  . magic mouthwash  5 mL Oral TID Bo Merino, MD       Social History   Social History  . Marital status: Married    Spouse name: N/A  . Number of children: N/A  . Years of education: N/A   Occupational History  . Not on file.   Social History Main Topics  . Smoking status: Former Smoker    Quit date: 07/05/2001  . Smokeless tobacco: Never Used  . Alcohol use 8.4 oz/week    14 Cans of beer per week  . Drug use: No  . Sexual activity: Yes    Birth control/ protection: Condom   Other Topics Concern  . Not on file   Social History Narrative   Marital status: married x 17 years; happily married      Children: 2 children (58, 71)      Lives: with husband, 2 children.      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 Heart disease Mother 66       CAD/stenting  . Hyperlipidemia Mother   . Arthritis Mother        ostearthritis  . Hypertension Mother   . Kidney disease Mother   . Irritable bowel syndrome Mother   . Stroke Mother 66       CVA  . Fibromyalgia Mother   . Hyperlipidemia Father   . Neuropathy Father   . Hyperlipidemia Sister   . Hypertension Brother   . Colon cancer Neg Hx   . Inflammatory bowel disease Neg Hx        Objective:    BP 109/70   Pulse 93   Temp 97.6 F (36.4 C) (Oral)   Resp 18   Ht 5' 5.75" (1.67 m)   Wt 143 lb (64.9 kg)   LMP 04/01/2017   SpO2 98%   BMI 23.26 kg/m  Physical Exam  Constitutional: She is oriented to person, place, and time. She appears well-developed and well-nourished. No  distress.  HENT:  Head: Normocephalic and atraumatic.  Right Ear: External ear normal.  Left Ear: External ear normal.  Nose: Nose normal.  Mouth/Throat: Oropharynx is clear and moist.  Eyes: Pupils are equal, round, and reactive to light. Conjunctivae and EOM are normal.  Neck: Normal range of motion. Neck supple. Carotid bruit is not present. No thyromegaly present.  Cardiovascular: Normal rate, regular rhythm, normal heart sounds and intact distal pulses.  Exam reveals no gallop and no friction rub.   No murmur heard. Pulmonary/Chest: Effort normal and breath sounds normal. She has no wheezes. She has no rales. Right breast exhibits no inverted nipple, no mass, no nipple discharge, no skin change and no tenderness. Left breast exhibits no inverted nipple, no mass, no nipple discharge, no skin change and no tenderness.  Abdominal: Soft. Bowel sounds are normal. She exhibits no distension and no mass. There is no tenderness. There is no rebound and no guarding.  Genitourinary: Vagina normal. There is no rash, tenderness or lesion on the right labia. There is no rash, tenderness, lesion or injury on the left labia.   Lymphadenopathy:    She has no cervical adenopathy.  Neurological: She is alert and oriented to person, place, and time. No cranial nerve deficit.  Skin: Skin is warm and dry. No rash noted. She is not diaphoretic. No erythema. No pallor.  Psychiatric: She has a normal mood and affect. Her behavior is normal.   Depression screen [facility] 2/9 04/19/2017 03/06/2017 11/04/2016 11/02/2016 10/12/2016  Decreased Interest 0 0 0 0 0  Down, Depressed, Hopeless 0 0 0 0 0  PHQ - 2 Score 0 0 0 0 0   Fall Risk  04/19/2017 03/06/2017 11/04/2016 11/02/2016 10/12/2016  Falls in the past year? No No No No No  Number falls in past yr: - - - - -  Injury with [place]:   1. Routine physical examination   2. Cervical cancer screening   3. Breast cancer screening   4. Attention deficit hyperactivity disorder (ADHD), predominantly inattentive type   5. History of retinal tear   6. History of anxiety   7. Pure hypercholesterolemia   8. Other systemic lupus erythematosus with other organ involvement ([place])   9. Medication refill   10. Raynaud's phenomenon without gangrene    -anticipatory guidance provided --- exercise, weight loss, safe driving practices, calcium 624m bid or three servings daily. -obtain age appropriate screening labs and labs for chronic disease management. -call in three months for refill of Adderall; follow-up in six months.   Orders Placed This Encounter  Procedures  . MM SCREENING BREAST TOMO BILATERAL    Standing Status:   Future    Standing Expiration Date:   06/20/2018    Order Specific Question:   Reason for Exam (SYMPTOM  OR DIAGNOSIS REQUIRED)    Answer:   annual screening    Order Specific Question:   Is the patient pregnant?    Answer:   No    Order Specific Question:   [facility]    [facility]    Order Specific Question:   Has the patient fasted?    Answer:    Yes  . CBC with Differential/Platelet  . Lipid panel    Order Specific Question:   Has the patient fasted?  Answer:   Yes  . TSH   Meds ordered this encounter  Medications  . DISCONTD: amphetamine-dextroamphetamine (ADDERALL) 10 MG tablet    Sig: Take 10 mg once a day to twice daily.    Dispense:  60 tablet    Refill:  0  . DISCONTD: amphetamine-dextroamphetamine (ADDERALL) 10 MG tablet    Sig: Take 10 mg once a day to twice daily.    Dispense:  60 tablet    Refill:  0    DO NOT FILL FOR 30 DAYS AFTER PRESCRIBED  . amphetamine-dextroamphetamine (ADDERALL) 10 MG tablet    Sig: Take 10 mg once a day to twice daily.    Dispense:  60 tablet    Refill:  0    DO NOT FILL FOR 60 DAYS AFTER PRESCRIBED    Return in about 6 months (around 10/20/2017) for recheck ADD.   Robin [name], M.D. Primary Care at [facility] [address] [place], [place]  [address] [phone] phone [phone] fax

## 2017-04-20 LAB — CBC WITH DIFFERENTIAL/PLATELET
Basophils Absolute: 0 10*3/uL (ref 0.0–0.2)
Basos: 0 %
EOS (ABSOLUTE): 0.1 10*3/uL (ref 0.0–0.4)
Eos: 1 %
Hematocrit: 42 % (ref 34.0–46.6)
Hemoglobin: 13.8 g/dL (ref 11.1–15.9)
Immature Grans (Abs): 0 10*3/uL (ref 0.0–0.1)
Immature Granulocytes: 0 %
Lymphocytes Absolute: 1.3 10*3/uL (ref 0.7–3.1)
Lymphs: 27 %
MCH: 29.9 pg (ref 26.6–33.0)
MCHC: 32.9 g/dL (ref 31.5–35.7)
MCV: 91 fL (ref 79–97)
Monocytes Absolute: 0.5 10*3/uL (ref 0.1–0.9)
Monocytes: 9 %
Neutrophils Absolute: 3.1 10*3/uL (ref 1.4–7.0)
Neutrophils: 63 %
Platelets: 254 10*3/uL (ref 150–379)
RBC: 4.61 x10E6/uL (ref 3.77–5.28)
RDW: 14.1 % (ref 12.3–15.4)
WBC: 4.9 10*3/uL (ref 3.4–10.8)

## 2017-04-20 LAB — COMPREHENSIVE METABOLIC PANEL
ALT: 7 IU/L (ref 0–32)
AST: 14 IU/L (ref 0–40)
Albumin/Globulin Ratio: 1.7 (ref 1.2–2.2)
Albumin: 4.8 g/dL (ref 3.5–5.5)
Alkaline Phosphatase: 27 IU/L — ABNORMAL LOW (ref 39–117)
BUN/Creatinine Ratio: 13 (ref 9–23)
BUN: 12 mg/dL (ref 6–24)
Bilirubin Total: 0.6 mg/dL (ref 0.0–1.2)
CO2: 23 mmol/L (ref 20–29)
Calcium: 9.7 mg/dL (ref 8.7–10.2)
Chloride: 100 mmol/L (ref 96–106)
Creatinine, Ser: 0.96 mg/dL (ref 0.57–1.00)
GFR calc Af Amer: 83 mL/min/{1.73_m2} (ref >59)
GFR calc non Af Amer: 72 mL/min/{1.73_m2} (ref >59)
Globulin, Total: 2.8 g/dL (ref 1.5–4.5)
Glucose: 76 mg/dL (ref 65–99)
Potassium: 4.6 mmol/L (ref 3.5–5.2)
Sodium: 137 mmol/L (ref 134–144)
Total Protein: 7.6 g/dL (ref 6.0–8.5)

## 2017-04-20 LAB — PAP IG AND HPV HIGH-RISK
HPV, high-risk: NEGATIVE
PAP Smear Comment: 0

## 2017-04-20 LAB — LIPID PANEL
Chol/HDL Ratio: 3 ratio (ref 0.0–4.4)
Cholesterol, Total: 222 mg/dL — ABNORMAL HIGH (ref 100–199)
HDL: 75 mg/dL (ref >39)
LDL Calculated: 132 mg/dL — ABNORMAL HIGH (ref 0–99)
Triglycerides: 76 mg/dL (ref 0–149)
VLDL Cholesterol Cal: 15 mg/dL (ref 5–40)

## 2017-04-20 LAB — TSH: TSH: 2.71 u[IU]/mL (ref 0.450–4.500)

## 2017-04-29 ENCOUNTER — Telehealth: Payer: Self-pay | Admitting: General Practice

## 2017-04-29 NOTE — Telephone Encounter (Signed)
Pt had jellish fish sting  Please call to advise  [phone]

## 2017-05-01 ENCOUNTER — Ambulatory Visit: Payer: BLUE CROSS/BLUE SHIELD | Admitting: Physician Assistant

## 2017-05-02 NOTE — Telephone Encounter (Signed)
LVM to call back. Patient had an appointment scheduled yesterday but was a NO SHOW

## 2017-05-09 ENCOUNTER — Telehealth: Payer: Self-pay

## 2017-05-09 NOTE — Telephone Encounter (Signed)
Had message on my VM stating that patient was going out of town today and needed her medication.  It did not specify which medication.  I called patient back and LMVM that she could call me back with needed medication and if was one that could be sent in electronically or faxed we would do our best to help her.  I left my direct line for her to return call.

## 2017-05-18 NOTE — Telephone Encounter (Signed)
Call placed to patient, second message left to return call to this office./ S.Yarlin Breisch,CMA

## 2017-05-24 ENCOUNTER — Ambulatory Visit (INDEPENDENT_AMBULATORY_CARE_PROVIDER_SITE_OTHER): Payer: BLUE CROSS/BLUE SHIELD | Admitting: Family Medicine

## 2017-05-24 VITALS — BP 101/67 | HR 73 | Temp 97.6°F | Resp 16 | Ht 65.75 in | Wt 147.0 lb

## 2017-05-24 DIAGNOSIS — Z3009 Encounter for other general counseling and advice on contraception: Secondary | ICD-10-CM | POA: Diagnosis not present

## 2017-05-24 DIAGNOSIS — N920 Excessive and frequent menstruation with regular cycle: Secondary | ICD-10-CM | POA: Diagnosis not present

## 2017-05-24 DIAGNOSIS — Z113 Encounter for screening for infections with a predominantly sexual mode of transmission: Secondary | ICD-10-CM

## 2017-05-24 NOTE — Patient Instructions (Addendum)
  [facility] [phone]  Call in one week to see if [facility] Once your IUD is received we will contact you to set up an appointment  Keep a track of your period and use condoms to prevent unintended pregnancy On the day of placement we are required to do a pregnancy test even if you are on your period.   IF you received an x-ray today, you will receive an invoice from [facility]. Please contact [facility] at [phone] with questions or concerns regarding your invoice.   IF you received labwork today, you will receive an invoice from [facility]. Please contact [facility] at [phone] with questions or concerns regarding your invoice.   Our billing staff will not be able to assist you with questions regarding bills from these companies.  You will be contacted with the lab results as soon as they are available. The fastest way to get your results is to activate your My Chart account. Instructions are located on the last page of this paperwork. If you have not heard from [place] regarding the results in 2 weeks, please contact this office.    We recommend that you schedule a mammogram for breast cancer screening. Typically, you do not need a referral to do this. Please contact a local imaging center to schedule your mammogram.  [facility] - [phone]  *ask for the Radiology Department [facility] ([place]) - [phone] or [phone]  [facility] - [phone] [place] [phone] [facility] - [phone]  *ask for the [facility] - [phone]  *ask for the Radiology Department [facility] - [phone]  *ask for the [facility] - [phone]

## 2017-05-24 NOTE — Progress Notes (Signed)
Chief Complaint  Patient presents with  . IUD consult    HPI  She is a [id number].  She would not desire another pregnancy in the future.  She is interested in the IUD because of her heavy periods She states that hormonal birth control in the past has had an impact on mood that has been very negative.  She reports that she would get lack of affect and depression She reports that she last took OCPs in her 56s She has never tried Depo because of the mood She has also tried diaphragm and condoms as well  Patient's last menstrual period was 05/19/2017. Her periods are monthly with a regular cycle She states that she typically has to change super plus and super tampons with bleed through She reports a history of anemia CBC    Component Value Date/Time   WBC 4.9 04/19/2017 1214   WBC 4.3 03/28/2017 1139   RBC 4.61 04/19/2017 1214   RBC 4.53 03/28/2017 1139   HGB 13.8 04/19/2017 1214   HCT 42.0 04/19/2017 1214   PLT 254 04/19/2017 1214   MCV 91 04/19/2017 1214   MCH 29.9 04/19/2017 1214   MCH 30.0 03/28/2017 1139   MCHC 32.9 04/19/2017 1214   MCHC 32.3 03/28/2017 1139   RDW 14.1 04/19/2017 1214   LYMPHSABS 1.3 04/19/2017 1214   MONOABS 473 03/28/2017 1139   EOSABS 0.1 04/19/2017 1214   BASOSABS 0.0 04/19/2017 1214   She is a former smoker who quit 10 years ago She denies liver disease, cancer, blood clots, uncontrolled migraines   Past Medical History:  Diagnosis Date  . ADD (attention deficit disorder)   . Allergy   . Anemia   . Anxiety   . Hyperlipidemia   . Ileitis   . Lupus     Current Outpatient Prescriptions  Medication Sig Dispense Refill  . acetaminophen (TYLENOL) 500 MG tablet Take 1,000 mg by mouth every 6 (six) hours as needed for mild pain.    Marland Kitchen albuterol (PROVENTIL HFA;VENTOLIN HFA) 108 (90 Base) MCG/ACT inhaler Inhale 2 puffs into the lungs every 6 (six) hours as needed for wheezing or shortness of breath. 1 Inhaler 0  . amphetamine-dextroamphetamine  (ADDERALL) 10 MG tablet Take 10 mg once a day to twice daily. 60 tablet 0  . Boric Acid Topical POWD Place vaginally.    . hydroxychloroquine (PLAQUENIL) 200 MG tablet TAKE 1 TABLET BY MOUTH TWICE DAILY( EVERY TWELVE HOURS) MONDAY- FRIDAY 40 tablet 2  . loratadine (CLARITIN) 10 MG tablet Take 10 mg by mouth daily.    Ernestine Conrad 3-6-9 Fatty Acids (OMEGA-3-6-9 PO) Take 2 capsules by mouth daily.    . Probiotic Product (PROBIOTIC COMPLEX ACIDOPHILUS PO) Take 1 capsule by mouth daily.     . sodium chloride (OCEAN) 0.65 % nasal spray Place 1 spray into the nose daily as needed for congestion.     . VENTOLIN HFA 108 (90 Base) MCG/ACT inhaler INHALE 2 PUFFS INTO THE LUNGS EVERY 6 HOURS AS NEEDED FOR WHEEZING OR SHORTNESS OF BREATH 18 g 0  . VITAMIN D, CHOLECALCIFEROL, PO Take 500 mg by mouth.    Marland Kitchen azelastine (ASTELIN) 0.1 % nasal spray Place 2 sprays into both nostrils 2 (two) times daily. Use in each nostril as directed (Patient not taking: Reported on 05/24/2017) 30 mL 0  . TURMERIC PO Take by mouth 4 (four) times daily.     Current Facility-Administered Medications  Medication Dose Route Frequency Provider Last Rate Last Dose  .  magic mouthwash  5 mL Oral TID Bo Merino, MD        Allergies:  Allergies  Allergen Reactions  . Avelox [Moxifloxacin Hcl In Nacl] Other (See Comments)    Muscle cramps  . Cefaclor Hives    Tolerates penicillins  . Latex Other (See Comments)    "Raw skin"  . Meloxicam Rash  . Naproxen Rash    Past Surgical History:  Procedure Laterality Date  . CESAREAN SECTION     x 2  . DENTAL SURGERY    . EYE SURGERY Bilateral    cryoretinopexy    Social History   Social History  . Marital status: Married    Spouse name: N/A  . Number of children: N/A  . Years of education: N/A   Social History Main Topics  . Smoking status: Former Smoker    Quit date: 07/05/2001  . Smokeless tobacco: Never Used  . Alcohol use 8.4 oz/week    14 Cans of beer per week  .  Drug use: No  . Sexual activity: Yes    Birth control/ protection: Condom   Other Topics Concern  . Not on file   Social History Narrative   Marital status: married x 17 years; happily married      Children: 2 children (26, 56)      Lives: with husband, 2 children.      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 She appears well-developed and well-nourished.  HENT:  Head: Normocephalic and atraumatic.  Eyes: Conjunctivae and EOM are normal.  Pulmonary/Chest: Effort normal.  Neurological: She is alert and oriented to person, place, and time.  Skin: No rash noted. No erythema.  Psychiatric: She has a normal mood and affect. Her behavior is normal. Judgment and thought content normal.    Assessment and Plan Robin [name] was seen today for iud consult.  Diagnoses and all orders for this visit:  Counseling for birth control regarding intrauterine device (IUD) -     GC/Chlamydia Probe Amp  Screen for STD (sexually transmitted disease) -     GC/Chlamydia Probe Amp  Menorrhagia with regular cycle  Discussed that PID is a risk factor for uterine rupture Pt is monogamous but will screen for gc/ct with urine specimen Reviewed her pap smear and the results were negative Discussed risks and benefits and currently is a good option for heavy menses  as well as contraception [email or web address] reviewed extensively with the patient and IUD mirena form completed Pt to return to clinic for placement once item received  A total of 25 minutes were spent face-to-face with the patient during this encounter and over half of that time was spent on counseling and coordination of care.  [place]

## 2017-05-25 LAB — GC/CHLAMYDIA PROBE AMP
Chlamydia trachomatis, NAA: NEGATIVE
Neisseria gonorrhoeae by PCR: NEGATIVE

## 2017-06-13 ENCOUNTER — Telehealth: Payer: Self-pay | Admitting: Family Medicine

## 2017-06-13 NOTE — Telephone Encounter (Signed)
PATIENT IS CALLING TO SEE IF HER IUD HAS COME IN YET SO THAT SHE CAN SCHEDULE AN APPOINTMENT TO HAVE DR. STALLINGS DO THE PROCEDURE. HER CONSULT WITH DR. Nolon Rod WAS 05/24/17. BEST PHONE [phone] (CELL) [place]

## 2017-06-15 NOTE — Telephone Encounter (Signed)
Do you know the status of this. I have not seen one for this patient.

## 2017-06-19 NOTE — Telephone Encounter (Signed)
Delores,  Can you please check on the status of the IUD [facility] [phone]

## 2017-06-19 NOTE — Telephone Encounter (Signed)
Spoke with Magda Paganini at [facility], the reason we haven't received Mirena for pt is because pt must call to release order and pt consent is needed.  Pt will need to all [phone] to give consent so Mirena can be released and ship to our office.  Contacted pt and info given- pt will call and give consent to release order.  I advised once Mirena received we will contact her that it has arrived and she can schedule procedure accordingly.  Pt agreeable. Delores Gaddy, CMA (AAMA)

## 2017-06-28 ENCOUNTER — Other Ambulatory Visit: Payer: Self-pay | Admitting: Rheumatology

## 2017-06-28 NOTE — Telephone Encounter (Signed)
Last visit: 04/05/17 Next visit: 08/31/17 Labs: 04/19/17 stable  Eye exam: 07/06/16 normal  Ok to refill per Dr. Estanislado Pandy.

## 2017-07-17 ENCOUNTER — Ambulatory Visit: Payer: Self-pay

## 2017-08-21 NOTE — Progress Notes (Signed)
Office Visit Note  Patient: Robin [name]             Date of Birth: Dec 18, 1970           MRN: [id number]             PCP: Wardell Honour, MD Referring: Wardell Honour, MD Visit Date: 08/31/2017 Occupation: @GUAROCC @    Subjective:  Aphasia   History of Present Illness: Robin [name] is a 46 y.o. female with a history of systemic lupus erythematosus.  Patient states her last lupus flare was August-November 2017.  She states she continues to have fatigue.  She denies any joint pain or swelling.  She states she still gets occasional oral ulcers and has 2 currently.  Denies nasal ulcers.  Denies any recent rashes or photosensivity.  She states her Raynaud's has been worsening since it has been colder.  She states her plantar fasciitis has resolved.  She states she has been doing well on PLQ 200 mg BID M-F.  She has her next eye exam scheduled in January 2018.  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  She states she does not wear her brace much but has been working on positioning and avoiding overuse activities. Patient states she had had worsening aphasia.  She states this is a chronic issue but the word substitution and word finding has become more frequent and occurs several times per day.  She states it used to be worse when she was tired or anxious, but now it is occurring during normal activities.  She is getting very concerned about this issue.     Activities of Daily Living:  Patient reports morning stiffness for 0 minutes.   Patient Denies nocturnal pain.  Difficulty dressing/grooming: Denies Difficulty climbing stairs: Denies Difficulty getting out of chair: Denies Difficulty using hands for taps, buttons, cutlery, and/or writing: Reports   Review of Systems  Constitutional: Positive for fatigue. Negative for weakness.  HENT: Positive for mouth sores and mouth dryness. Negative for nose dryness.   Eyes: Positive for dryness. Negative for redness.    Respiratory: Negative for cough, hemoptysis, shortness of breath and difficulty breathing.   Cardiovascular: Negative for chest pain, palpitations, hypertension and swelling in legs/feet.  Gastrointestinal: Positive for constipation. Negative for blood in stool and diarrhea.  Endocrine: Negative for increased urination.  Genitourinary: Negative for painful urination.  Musculoskeletal: Negative for arthralgias, joint pain, joint swelling, myalgias, muscle weakness, morning stiffness, muscle tenderness and myalgias.  Skin: Positive for color change. Negative for pallor, rash, hair loss, nodules/bumps, redness, skin tightness, ulcers and sensitivity to sunlight.  Neurological: Negative for dizziness, numbness and headaches.  Hematological: Negative for swollen glands.  Psychiatric/Behavioral: Negative for depressed mood and sleep disturbance. The patient is not nervous/anxious.     PMFS History:  Patient Active Problem List   Diagnosis Date Noted  . History of hyperlipidemia 11/08/2016  . History of anxiety 11/08/2016  . History of retinal tear 11/08/2016  . History of Bilateral plantar fasciitis 08/10/2016  . Plantar fasciitis 07/04/2016  . Erythema nodosum 07/04/2016  . High risk medication use 07/04/2016  . Lupus 05/23/2016  . 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 ADHD (attention deficit hyperactivity disorder) 12/22/2014  . Raynaud's phenomenon 10/17/2013  . Hyperlipidemia 09/28/2011    Past Medical History:  Diagnosis Date  . ADD (attention deficit disorder)   . Allergy   . Anemia   . Anxiety   . Hyperlipidemia   .  Ileitis   . Lupus     Family History  Problem Relation Age of Onset  . Heart disease Mother 39       CAD/stenting  . Hyperlipidemia Mother   . Arthritis Mother        ostearthritis  . Hypertension Mother   . Kidney disease Mother   . Irritable bowel syndrome Mother   . Stroke Mother 4       CVA  . Fibromyalgia Mother   . Hyperlipidemia Father   .  Neuropathy Father   . Hyperlipidemia Sister   . Hypertension Brother   . Colon cancer Neg Hx   . Inflammatory bowel disease Neg Hx    Past Surgical History:  Procedure Laterality Date  . CESAREAN SECTION     x 2  . DENTAL SURGERY    . EYE SURGERY Bilateral    cryoretinopexy   Social History   Social History Narrative   Marital status: married x 17 years; happily married      Children: 2 children (23, 24)      Lives: with husband, 2 children.      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 She appears well-developed and well-nourished.  HENT:  Head: Normocephalic and atraumatic.  2 oral ulcers present No nasal ulcers  Eyes: Conjunctivae and EOM are normal.  Neck: Normal range of motion.  Cardiovascular: Normal rate, regular rhythm, normal heart sounds and intact distal pulses.  Pulmonary/Chest: Effort normal and breath sounds normal.  Abdominal: Soft. Bowel sounds are normal.  Lymphadenopathy:    She has no cervical adenopathy.  Neurological: She is alert and oriented to person, place, and time.  Skin: Skin is warm and dry. Capillary refill takes less than 2 seconds.  Psychiatric: She has a normal mood and affect. Her behavior is normal.  Nursing note and vitals reviewed.    Musculoskeletal Exam: C-spine, thoracic, and lumbar spine good ROM.  Shoulder joints, elbow joints, wrist joints, MCPs, PIPs, and DIPs good ROM with no synovitis.  Hip joints, knee joints, and ankle joints good ROM.  MTPs, PIPs, and DIPs good ROM with no synovitis.   No midline spinal tenderness or SI joint tenderness.  No tenderness of trochanteric bursa bilaterally.    CDAI Exam: No CDAI exam completed.    Investigation: No additional findings.PLQ eye exam: 07/06/2016 CBC Latest Ref Rng & Units 04/19/2017 03/28/2017 11/09/2016  WBC 3.4 - 10.8 x10E3/uL 4.9 4.3 4.2  Hemoglobin 11.1 - 15.9 g/dL 13.8 13.6 13.2  Hematocrit 34.0 - 46.6 % 42.0 42.1 40.1  Platelets 150 - 379 x10E3/uL 254 248 222   CMP Latest Ref Rng & Units 04/19/2017 03/28/2017 11/09/2016  Glucose 65 - 99 mg/dL 76 77 77  BUN 6 - 24 mg/dL 12 12 13   Creatinine 0.57 - 1.00 mg/dL 0.96 0.91 0.86  Sodium 134 - 144 mmol/L 137 135 138  Potassium  3.5 - 5.2 mmol/L 4.6 4.8 4.4  Chloride 96 - 106 mmol/L 100 101 106  CO2 20 - 29 mmol/L 23 23 24   Calcium 8.7 - 10.2 mg/dL 9.7 9.8 9.4  Total Protein 6.0 - 8.5 g/dL 7.6 7.3 7.0  Total Bilirubin 0.0 - 1.2 mg/dL 0.6 0.7 0.7  Alkaline Phos 39 - 117 IU/L 27(L) 28(L) 23(L)  AST 0 - 40 IU/L 14 17 17   ALT 0 - 32 IU/L 7 9 10     Imaging: No results found.  Speciality Comments: No specialty comments available.    Procedures:  No procedures performed Allergies: Avelox [moxifloxacin hcl in nacl]; Cefaclor; Latex; Red wine complex [germanium]; Meloxicam; and Naproxen   Assessment / Plan:     Visit Diagnoses: Other systemic lupus erythematosus with other organ involvement (HCC) - ANA 1:320 speckled, history of oral ulcers, lymphadenopathy, photosensitivity, Raynauds, erythema nodosum, inflammatory arthritis -No synovitis on exam.  No recent rashes, photosensitivity, joint pain or swelling. She continues have fatigue, Raynaud's, and occasional oral ulcers. She will continue on Plaquenil 200 mg BID M-F. Routine lupus labs were drawn today.  Plan: Urinalysis, Routine w reflex microscopic, Anti-DNA antibody, double-stranded, C3 and C4, Sedimentation rate, VITAMIN D 25 Hydroxy (Vit-D Deficiency, Fractures)  High risk medication use - Plaquenil 200 mg by mouth twice a  day Monday to Friday.eye exam scheduled in January 2019. Patient was given eye exam handout to be filled out.  Routine CBC, CMP were drawn today and will be repeated every 5 months.  Her routine  - Plan: COMPLETE METABOLIC PANEL WITH GFR, CBC with Differential/Platelet  Word finding difficulty: Patient reports a long history of word finding difficulty and frequent word substitutions.  She states the occurrences have become more regular and have begun to concern her.     Raynaud's phenomenon without gangrene: Chronic. No ulcerations or signs of gangrene.  Continues have worsening symptoms with the cold weather.  She plans to buy electric socks and wears gloves frequently.    Erythema nodosum: No active erythema nodosum.    Plantar fasciitis: Resolved.  She tries to wear proper fitting shoes.    Other fatigue: Chronic and stable.   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 Urinalysis, Routine w reflex microscopic  . Anti-DNA antibody, double-stranded  . C3 and C4  . Sedimentation rate  . VITAMIN D 25 Hydroxy (Vit-D Deficiency, Fractures)  . Ambulatory referral to Neurology   No orders of the defined types were placed in this encounter.   Face-to-face time spent with patient was 30 minutes. Greater than 50% of time was spent in counseling and coordination of care.  Follow-Up Instructions: Return in about 5 months (around 01/29/2018) for Systemic lupus erythematosus. Bo Merino, MD   Note - This record has been created using Editor, commissioning.  Chart creation errors have been sought, but may not always  have been located. Such creation errors do not reflect on  the standard of medical care.

## 2017-08-25 ENCOUNTER — Ambulatory Visit: Payer: BLUE CROSS/BLUE SHIELD

## 2017-08-31 ENCOUNTER — Ambulatory Visit: Payer: BLUE CROSS/BLUE SHIELD | Admitting: Rheumatology

## 2017-08-31 ENCOUNTER — Encounter: Payer: Self-pay | Admitting: Rheumatology

## 2017-08-31 VITALS — BP 118/77 | HR 86 | Ht 66.0 in | Wt 148.0 lb

## 2017-08-31 DIAGNOSIS — Z8669 Personal history of other diseases of the nervous system and sense organs: Secondary | ICD-10-CM | POA: Diagnosis not present

## 2017-08-31 DIAGNOSIS — M3219 Other organ or system involvement in systemic lupus erythematosus: Secondary | ICD-10-CM

## 2017-08-31 DIAGNOSIS — M722 Plantar fascial fibromatosis: Secondary | ICD-10-CM | POA: Diagnosis not present

## 2017-08-31 DIAGNOSIS — I7300 Raynaud's syndrome without gangrene: Secondary | ICD-10-CM | POA: Diagnosis not present

## 2017-08-31 DIAGNOSIS — K5000 Crohn's disease of small intestine without complications: Secondary | ICD-10-CM

## 2017-08-31 DIAGNOSIS — L52 Erythema nodosum: Secondary | ICD-10-CM | POA: Diagnosis not present

## 2017-08-31 DIAGNOSIS — Z79899 Other long term (current) drug therapy: Secondary | ICD-10-CM | POA: Diagnosis not present

## 2017-08-31 DIAGNOSIS — R5383 Other fatigue: Secondary | ICD-10-CM | POA: Diagnosis not present

## 2017-08-31 DIAGNOSIS — Z8639 Personal history of other endocrine, nutritional and metabolic disease: Secondary | ICD-10-CM | POA: Diagnosis not present

## 2017-08-31 DIAGNOSIS — R4789 Other speech disturbances: Secondary | ICD-10-CM | POA: Diagnosis not present

## 2017-08-31 DIAGNOSIS — Z8659 Personal history of other mental and behavioral disorders: Secondary | ICD-10-CM

## 2017-08-31 DIAGNOSIS — F900 Attention-deficit hyperactivity disorder, predominantly inattentive type: Secondary | ICD-10-CM

## 2017-09-01 NOTE — Progress Notes (Signed)
Alkaline phosphatase is low. Please add phosphorus to her labs. WBC is low. We will monitor for right now.

## 2017-09-06 LAB — TEST AUTHORIZATION

## 2017-09-06 LAB — CBC WITH DIFFERENTIAL/PLATELET
Basophils Absolute: 10 cells/uL (ref 0–200)
Basophils Relative: 0.3 %
Eosinophils Absolute: 82 cells/uL (ref 15–500)
Eosinophils Relative: 2.4 %
HCT: 40.8 % (ref 35.0–45.0)
Hemoglobin: 13.8 g/dL (ref 11.7–15.5)
Lymphs Abs: 1231 cells/uL (ref 850–3900)
MCH: 30.7 pg (ref 27.0–33.0)
MCHC: 33.8 g/dL (ref 32.0–36.0)
MCV: 90.9 fL (ref 80.0–100.0)
MPV: 9.9 fL (ref 7.5–12.5)
Monocytes Relative: 8.9 %
Neutro Abs: 1775 cells/uL (ref 1500–7800)
Neutrophils Relative %: 52.2 %
Platelets: 264 10*3/uL (ref 140–400)
RBC: 4.49 10*6/uL (ref 3.80–5.10)
RDW: 13 % (ref 11.0–15.0)
Total Lymphocyte: 36.2 %
WBC mixed population: 303 cells/uL (ref 200–950)
WBC: 3.4 10*3/uL — ABNORMAL LOW (ref 3.8–10.8)

## 2017-09-06 LAB — COMPLETE METABOLIC PANEL WITH GFR
AG Ratio: 1.5 (calc) (ref 1.0–2.5)
ALT: 8 U/L (ref 6–29)
AST: 16 U/L (ref 10–30)
Albumin: 4.4 g/dL (ref 3.6–5.1)
Alkaline phosphatase (APISO): 28 U/L — ABNORMAL LOW (ref 33–115)
BUN: 12 mg/dL (ref 7–25)
CO2: 29 mmol/L (ref 20–32)
Calcium: 9.5 mg/dL (ref 8.6–10.2)
Chloride: 101 mmol/L (ref 98–110)
Creat: 0.82 mg/dL (ref 0.50–1.10)
GFR, Est African American: 101 mL/min/{1.73_m2} (ref >=60)
GFR, Est Non African American: 87 mL/min/{1.73_m2} (ref >=60)
Globulin: 3 g/dL (calc) (ref 1.9–3.7)
Glucose, Bld: 85 mg/dL (ref 65–99)
Potassium: 4.5 mmol/L (ref 3.5–5.3)
Sodium: 137 mmol/L (ref 135–146)
Total Bilirubin: 0.6 mg/dL (ref 0.2–1.2)
Total Protein: 7.4 g/dL (ref 6.1–8.1)

## 2017-09-06 LAB — URINALYSIS, ROUTINE W REFLEX MICROSCOPIC
Bilirubin Urine: NEGATIVE
Glucose, UA: NEGATIVE
Hgb urine dipstick: NEGATIVE
Ketones, ur: NEGATIVE
Leukocytes, UA: NEGATIVE
Nitrite: NEGATIVE
Protein, ur: NEGATIVE
Specific Gravity, Urine: 1.006 (ref 1.001–1.03)
pH: 7.5 (ref 5.0–8.0)

## 2017-09-06 LAB — VITAMIN D 25 HYDROXY (VIT D DEFICIENCY, FRACTURES): Vit D, 25-Hydroxy: 40 ng/mL (ref 30–100)

## 2017-09-06 LAB — PHOSPHORUS: Phosphorus: 3.6 mg/dL (ref 2.5–4.5)

## 2017-09-06 LAB — C3 AND C4
C3 Complement: 94 mg/dL (ref 83–193)
C4 Complement: 30 mg/dL (ref 15–57)

## 2017-09-06 LAB — ANTI-DNA ANTIBODY, DOUBLE-STRANDED: ds DNA Ab: 1 IU/mL

## 2017-09-06 LAB — SEDIMENTATION RATE: Sed Rate: 6 mm/h (ref 0–20)

## 2017-09-18 ENCOUNTER — Encounter: Payer: Self-pay | Admitting: *Deleted

## 2017-09-19 ENCOUNTER — Encounter: Payer: Self-pay | Admitting: Neurology

## 2017-09-19 ENCOUNTER — Telehealth: Payer: Self-pay | Admitting: Rheumatology

## 2017-09-19 NOTE — Telephone Encounter (Signed)
Patient is returning Andrea's call.  Her CB# [phone]

## 2017-09-19 NOTE — Telephone Encounter (Signed)
Attempted to contact the patient and left message to advise patient we have mailed a copy of labs to her.

## 2017-09-22 ENCOUNTER — Other Ambulatory Visit: Payer: Self-pay | Admitting: Rheumatology

## 2017-09-25 NOTE — Telephone Encounter (Signed)
Last Visit: 08/31/17 Next Visit: 12/15/17 Labs: 08/31/17 Alkaline phosphatase is low. Please add phosphorus to her labs. WBC is low. We will monitor for right now. Phosphorus was normal.  PLQ eye exam on 07/06/16   Patient states she has an eye exam scheduled for March 2019.   Okay to refill per Dr. Estanislado Pandy

## 2017-10-05 ENCOUNTER — Other Ambulatory Visit: Payer: Self-pay | Admitting: Physician Assistant

## 2017-10-05 DIAGNOSIS — B9789 Other viral agents as the cause of diseases classified elsewhere: Principal | ICD-10-CM

## 2017-10-05 DIAGNOSIS — F411 Generalized anxiety disorder: Secondary | ICD-10-CM | POA: Diagnosis not present

## 2017-10-05 DIAGNOSIS — J069 Acute upper respiratory infection, unspecified: Secondary | ICD-10-CM

## 2017-10-12 ENCOUNTER — Telehealth: Payer: Self-pay | Admitting: Family Medicine

## 2017-10-12 DIAGNOSIS — Z760 Encounter for issue of repeat prescription: Secondary | ICD-10-CM

## 2017-10-12 DIAGNOSIS — F411 Generalized anxiety disorder: Secondary | ICD-10-CM | POA: Diagnosis not present

## 2017-10-12 MED ORDER — AMPHETAMINE-DEXTROAMPHETAMINE 10 MG PO TABS: ORAL_TABLET | ORAL | 0 refills | Status: DC

## 2017-10-12 NOTE — Telephone Encounter (Signed)
Adderall refill req sent to Dr. Tamala Julian

## 2017-10-12 NOTE — Telephone Encounter (Signed)
Copied from [place]. Topic: Quick Communication - Rx Refill/Question >> Oct 12, 2017  9:07 AM Synthia Innocent wrote: Medication: amphetamine-dextroamphetamine (ADDERALL) 10 MG tablet    Has the patient contacted their pharmacy? Yes.     (Agent: If no, request that the patient contact the pharmacy for the refill.)   Preferred Pharmacy (with phone number or street name): [facility] [place]   Agent: Please be advised that RX refills may take up to 3 business days. We ask that you follow-up with your pharmacy.

## 2017-10-30 ENCOUNTER — Other Ambulatory Visit: Payer: Self-pay

## 2017-10-30 ENCOUNTER — Ambulatory Visit: Payer: BLUE CROSS/BLUE SHIELD | Admitting: Family Medicine

## 2017-10-30 ENCOUNTER — Encounter: Payer: Self-pay | Admitting: Family Medicine

## 2017-10-30 VITALS — BP 116/77 | HR 88 | Temp 98.5°F | Resp 16 | Ht 66.0 in | Wt 151.2 lb

## 2017-10-30 DIAGNOSIS — F32A Depression, unspecified: Secondary | ICD-10-CM

## 2017-10-30 DIAGNOSIS — F4310 Post-traumatic stress disorder, unspecified: Secondary | ICD-10-CM

## 2017-10-30 DIAGNOSIS — Z760 Encounter for issue of repeat prescription: Secondary | ICD-10-CM | POA: Diagnosis not present

## 2017-10-30 DIAGNOSIS — F329 Major depressive disorder, single episode, unspecified: Secondary | ICD-10-CM

## 2017-10-30 DIAGNOSIS — F900 Attention-deficit hyperactivity disorder, predominantly inattentive type: Secondary | ICD-10-CM

## 2017-10-30 DIAGNOSIS — Z23 Encounter for immunization: Secondary | ICD-10-CM

## 2017-10-30 DIAGNOSIS — F419 Anxiety disorder, unspecified: Secondary | ICD-10-CM

## 2017-10-30 NOTE — Patient Instructions (Addendum)
  Please follow up with Psychiatry to discuss overarching PTSD and focus issues, depression and anxiety  [place], [place], [place] [address]  [phone] - [phone]   In [facility] [email or web address]   [place] [address]  [place], [place]  Call Dr. Vevelyn Pat [phone]     IF you received an x-ray today, you will receive an invoice from [facility]. Please contact [facility] at [phone] with questions or concerns regarding your invoice.   IF you received labwork today, you will receive an invoice from [facility]. Please contact [facility] at [phone] with questions or concerns regarding your invoice.   Our billing staff will not be able to assist you with questions regarding bills from these companies.  You will be contacted with the lab results as soon as they are available. The fastest way to get your results is to activate your My Chart account. Instructions are located on the last page of this paperwork. If you have not heard from [place] regarding the results in 2 weeks, please contact this office.

## 2017-10-30 NOTE — Progress Notes (Signed)
Chief Complaint  Patient presents with  . Medication Refill    no meds needed at this time.  Pt now sees a counselor and wants to discuss new dx and medication.  Pt would like flu vaccine    HPI   Pt reports that in the past she was treated with Lexapro and had side effects She reports that she experienced low libido, anorgasmia She reports that she noticed that she could feel when it went out of her system pretty quickly She reports that she is working with a Social worker, Joretta Bachelor, and was diagnosed with PTSD and ?ADD She reports that there is a question to whether or not she has ADD She takes Adderall daily and Plaquenil She wanted to see if she should get on Wellbutrin She denies active suicide ideation  Depression screen [facility] 2/9 10/30/2017 05/24/2017 04/19/2017 03/06/2017 11/04/2016  Decreased Interest 2 0 0 0 0  Down, Depressed, Hopeless 2 0 0 0 0  PHQ - 2 Score 4 0 0 0 0  Altered sleeping 2 - - - -  Tired, decreased energy 3 - - - -  Change in appetite 3 - - - -  Feeling bad or failure about yourself  3 - - - -  Trouble concentrating 3 - - - -  Moving slowly or fidgety/restless 3 - - - -  Suicidal thoughts 1 - - - -  PHQ-9 Score 22 - - - -  Difficult doing work/chores Somewhat difficult - - - -    GAD 7 : Generalized Anxiety Score 10/30/2017  Nervous, Anxious, on Edge 3  Control/stop worrying 3  Worry too much - different things 3  Trouble relaxing 3  Restless 3  Easily annoyed or irritable 2  Afraid - awful might happen 2  Total GAD 7 Score 19  Anxiety Difficulty Very difficult     Past Medical History:  Diagnosis Date  . ADD (attention deficit disorder)   . Allergy   . Anemia   . Anxiety   . Hyperlipidemia   . Ileitis   . Lupus     Current Outpatient Medications  Medication Sig Dispense Refill  . acetaminophen (TYLENOL) 500 MG tablet Take 1,000 mg by mouth every 6 (six) hours as needed for mild pain.    Marland Kitchen albuterol (PROVENTIL HFA;VENTOLIN HFA) 108 (90  Base) MCG/ACT inhaler Inhale 2 puffs into the lungs every 6 (six) hours as needed for wheezing or shortness of breath. 1 Inhaler 0  . amphetamine-dextroamphetamine (ADDERALL) 10 MG tablet Take 10 mg once a day to twice daily. 60 tablet 0  . azelastine (ASTELIN) 0.1 % nasal spray PLACE 2 SPRAYS INTO BOTH NOSTRILS TWICE DAILY 30 mL 0  . Boric Acid Topical POWD Place vaginally.    . DiphenhydrAMINE HCl (BENADRYL ALLERGY PO) Take by mouth as needed.    Marland Kitchen guaiFENesin (MUCINEX) 600 MG 12 hr tablet Take by mouth as needed.    . hydroxychloroquine (PLAQUENIL) 200 MG tablet TAKE 1 TABLET BY MOUTH TWICE DAILY(Q 12 HOURS) ON MONDAY-FRIDAY 40 tablet 2  . loratadine (CLARITIN) 10 MG tablet Take 10 mg by mouth daily.    Ernestine Conrad 3-6-9 Fatty Acids (OMEGA-3-6-9 PO) Take 2 capsules by mouth daily.    . Probiotic Product (PROBIOTIC COMPLEX ACIDOPHILUS PO) Take 1 capsule by mouth daily.     . sodium chloride (OCEAN) 0.65 % nasal spray Place 1 spray into the nose daily as needed for congestion.     . TURMERIC PO  Take by mouth 4 (four) times daily.    . VENTOLIN HFA 108 (90 Base) MCG/ACT inhaler INHALE 2 PUFFS INTO THE LUNGS EVERY 6 HOURS AS NEEDED FOR WHEEZING OR SHORTNESS OF BREATH 18 g 0  . VITAMIN D, CHOLECALCIFEROL, PO Take 500 mg by mouth.     Current Facility-Administered Medications  Medication Dose Route Frequency Provider Last Rate Last Dose  . magic mouthwash  5 mL Oral TID Bo Merino, MD        Allergies:  Allergies  Allergen Reactions  . Avelox [Moxifloxacin Hcl In Nacl] Other (See Comments)    Muscle cramps  . Cefaclor Hives    Tolerates penicillins  . Latex Other (See Comments)    "Raw skin"  . Red Wine Complex [Germanium]     Itching   . Meloxicam Rash  . Naproxen Rash    Past Surgical History:  Procedure Laterality Date  . CESAREAN SECTION     x 2  . DENTAL SURGERY    . EYE SURGERY Bilateral    cryoretinopexy    Social History   Socioeconomic History  . Marital  status: Married    Spouse name: None  . Number of children: None  . Years of education: None  . Highest education level: None  Social Needs  . Financial resource strain: None  . Food insecurity - worry: None  . Food insecurity - inability: None  . Transportation needs - medical: None  . Transportation needs - non-medical: None  Occupational History  . None  Tobacco Use  . Smoking status: Former Smoker    Last attempt to quit: 07/05/2001    Years since quitting: 16.3  . Smokeless tobacco: Never Used  Substance and Sexual Activity  . Alcohol use: Yes    Alcohol/week: 8.4 oz    Types: 14 Cans of beer per week  . Drug use: No  . Sexual activity: Yes    Birth control/protection: Condom  Other Topics Concern  . None  Social History Narrative   Marital status: married x 17 years; happily married      Children: 2 children (90, 22)      Lives: with husband, 2 children.      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 Heart disease Mother 38       CAD/stenting  . Hyperlipidemia Mother   . Arthritis Mother        ostearthritis  . Hypertension Mother   . Kidney disease Mother   . Irritable bowel syndrome Mother   . Stroke Mother 64       CVA  . Fibromyalgia Mother   . Hyperlipidemia Father   . Neuropathy Father   . Hyperlipidemia Sister   . Hypertension Brother   . Colon cancer Neg Hx   . Inflammatory bowel disease Neg Hx      ROS Review of Systems See HPI Constitution: No fevers or chills No malaise No diaphoresis Skin: No rash or itching Eyes: no blurry vision, no double vision GU: no dysuria or hematuria Neuro: no dizziness or headaches  all others reviewed and negative   Objective: Vitals:   10/30/17 0818  BP: 116/77  Pulse: 88  Resp: 16    Temp: 98.5 F (36.9 C)  TempSrc: Oral  SpO2: 100%  Weight: 151 lb 3.2 oz (68.6 kg)  Height: 5' 6"  (1.676 m)    Physical Exam  Constitutional: She is oriented to person, place, and time. She appears well-developed and well-nourished.  HENT:  Head: Normocephalic and atraumatic.  Eyes: Conjunctivae and EOM are normal.  Neck: Normal range of motion. No thyromegaly present.  Cardiovascular: Normal rate, regular rhythm and normal heart sounds.  Pulmonary/Chest: Effort normal and breath sounds normal. No stridor. No respiratory distress.  Neurological: She is alert and oriented to person, place, and time.  Skin: Skin is warm. Capillary refill takes less than 2 seconds.  Psychiatric: She has a normal mood and affect. Her behavior is normal. Judgment and thought content normal.    Assessment and Plan Robin [name] was seen today for medication refill.  Diagnoses and all orders for this visit:  Medication refill- advised pt to follow up with Psychiatry for discussion on how best to manage her ADD and new diagnosis of PTSD with her depression and anxiety I would not recommend combining Wellbutrin and Adderall Discussed that she should think about other meds which can also help to treat both  She is agreeable  Attention deficit hyperactivity disorder (ADHD), predominantly inattentive type- continue current dose for now until she sees Psychiatry  PTSD (post-traumatic stress disorder)- new diagnosis by Psychology Continue counseling  Anxiety and depression- active states, continue counseling  Flu vaccine need -     Flu Vaccine QUAD 36+ mos IM     Yovana Scogin A Oreoluwa Gilmer

## 2017-11-02 DIAGNOSIS — F411 Generalized anxiety disorder: Secondary | ICD-10-CM | POA: Diagnosis not present

## 2017-11-07 DIAGNOSIS — F411 Generalized anxiety disorder: Secondary | ICD-10-CM | POA: Diagnosis not present

## 2017-11-07 DIAGNOSIS — F4010 Social phobia, unspecified: Secondary | ICD-10-CM | POA: Diagnosis not present

## 2017-11-07 DIAGNOSIS — F3340 Major depressive disorder, recurrent, in remission, unspecified: Secondary | ICD-10-CM | POA: Diagnosis not present

## 2017-11-09 DIAGNOSIS — F411 Generalized anxiety disorder: Secondary | ICD-10-CM | POA: Diagnosis not present

## 2017-11-17 ENCOUNTER — Telehealth: Payer: Self-pay | Admitting: Family Medicine

## 2017-11-17 NOTE — Telephone Encounter (Signed)
Copied from [place] [phone]. Topic: Quick Communication - Rx Refill/Question >> Nov 17, 2017  1:37 PM Wynetta Emery, [place] C wrote: Medication:  Boric Acid Capsules - Pharmacy states that they faxed over refill request twice and is following up on them. (11/13/17 and 11/15/17)   Has the patient contacted their pharmacy?yes - pharmacy calling in.      (Agent: If no, request that the patient contact the pharmacy for the refill.)   Preferred Pharmacy (with phone number or street name): [place] phone : [phone]   Agent: Please be advised that RX refills may take up to 3 business days. We ask that you follow-up with your pharmacy.

## 2017-11-17 NOTE — Telephone Encounter (Signed)
LOV 10/30/17. Med refill.

## 2017-11-20 NOTE — Telephone Encounter (Signed)
Phone call to patient. Left detailed voice message per signed release regarding boric acid capsules request.   If patient calls back, please gather more information on the nature of her request. Is she having any symptoms that are concerning to her?

## 2017-11-23 DIAGNOSIS — F411 Generalized anxiety disorder: Secondary | ICD-10-CM | POA: Diagnosis not present

## 2017-11-29 DIAGNOSIS — F411 Generalized anxiety disorder: Secondary | ICD-10-CM | POA: Diagnosis not present

## 2017-12-06 DIAGNOSIS — F411 Generalized anxiety disorder: Secondary | ICD-10-CM | POA: Diagnosis not present

## 2017-12-07 DIAGNOSIS — F411 Generalized anxiety disorder: Secondary | ICD-10-CM | POA: Diagnosis not present

## 2017-12-07 DIAGNOSIS — F410 Panic disorder [episodic paroxysmal anxiety] without agoraphobia: Secondary | ICD-10-CM | POA: Diagnosis not present

## 2017-12-07 DIAGNOSIS — F3340 Major depressive disorder, recurrent, in remission, unspecified: Secondary | ICD-10-CM | POA: Diagnosis not present

## 2017-12-07 DIAGNOSIS — F4010 Social phobia, unspecified: Secondary | ICD-10-CM | POA: Diagnosis not present

## 2017-12-11 ENCOUNTER — Other Ambulatory Visit: Payer: Self-pay | Admitting: Family Medicine

## 2017-12-11 DIAGNOSIS — Z760 Encounter for issue of repeat prescription: Secondary | ICD-10-CM

## 2017-12-11 NOTE — Telephone Encounter (Signed)
Refill request for amphetamine-dextroamphetamine (Adderall) 10 MG Last refill 10/12/17/ LOV 10/30/17  PCP Dr. Reginia Forts

## 2017-12-11 NOTE — Telephone Encounter (Signed)
Last OV with [facility] on 10/30/17 regarding ADD and PTSD; defer to [facility] for refill.  Recommended follow-up with psychiatry last OV.

## 2017-12-11 NOTE — Telephone Encounter (Signed)
Copied from [place] [phone]. Topic: Quick Communication - Rx Refill/Question >> Dec 11, 2017 10:08 AM Bea Graff, NT wrote: Medication: amphetamine-dextroamphetamine (ADDERALL) Has the patient contacted their pharmacy? Yes.   (Agent: If no, request that the patient contact the pharmacy for the refill.) Preferred Pharmacy (with phone number or street name): [facility] [address] Agent: Please be advised that RX refills may take up to 3 business days. We ask that you follow-up with your pharmacy.

## 2017-12-14 DIAGNOSIS — F411 Generalized anxiety disorder: Secondary | ICD-10-CM | POA: Diagnosis not present

## 2017-12-15 ENCOUNTER — Other Ambulatory Visit (INDEPENDENT_AMBULATORY_CARE_PROVIDER_SITE_OTHER): Payer: BLUE CROSS/BLUE SHIELD

## 2017-12-15 ENCOUNTER — Encounter: Payer: Self-pay | Admitting: Neurology

## 2017-12-15 ENCOUNTER — Ambulatory Visit: Payer: BLUE CROSS/BLUE SHIELD | Admitting: Neurology

## 2017-12-15 VITALS — BP 100/68 | HR 68 | Ht 66.0 in | Wt 146.4 lb

## 2017-12-15 DIAGNOSIS — R4189 Other symptoms and signs involving cognitive functions and awareness: Secondary | ICD-10-CM

## 2017-12-15 DIAGNOSIS — R4789 Other speech disturbances: Secondary | ICD-10-CM

## 2017-12-15 DIAGNOSIS — G3184 Mild cognitive impairment, so stated: Secondary | ICD-10-CM

## 2017-12-15 LAB — VITAMIN B12: Vitamin B-12: 396 pg/mL (ref 211–911)

## 2017-12-15 NOTE — Telephone Encounter (Signed)
Pt requesting refill for Adderall.  Reviewed LOV note referencing f/u with psych.  Pt confirms she has been seeing psych for another condition but states she was unaware that psych was to be managing her Adderall as well.  Pt states she needs refill today and does not feel psych will give it to her with requiring add'l authorization.    Patient is requesting a refill of the following medications: Requested Prescriptions   Pending Prescriptions Disp Refills  . amphetamine-dextroamphetamine (ADDERALL) 10 MG tablet 60 tablet 0    Sig: Take 10 mg once a day to twice daily.    Date of patient request: 04/01 with 2nd call 04/05 Last office visit: 10/30/17 Date of last refill: 10/12/17 Last refill amount: #60 Follow up time period per chart: Pt to f/u with psych

## 2017-12-15 NOTE — Progress Notes (Signed)
[facility] Clinic Note - Initial Visit   Date: 12/15/17  JASMEEN FRITSCH MRN: [id number] DOB: 1970/09/23   Dear Dr. Estanislado Pandy:  Thank you for your kind referral of TAHJ LINDSETH for consultation of word-finding difficulty. Although her history is well known to you, please allow [place] to reiterate it for the purpose of our medical record. The patient was accompanied to the clinic by self.   History of Present Illness: Robin [name] is a 47 y.o. right-handed Caucasian female with SLE, Raynaud's phenomenon, ADD, right CTS, depression,  presenting for evaluation of word-finding difficulty.    She first starting noticing word substitution about 20 years ago, when she called a life-jacket a suitcase. Since this time, she had continued to have intermittent spells of word substitution, but this has worsened over the past two years.  She also has word-findings difficulty and often uses generalities, when she cannot remember.  Symptoms are worse when she is highly anxious, distracted, and fatigued.  She endorses personal lifesstressors over the past two years with new diagnosis of lupus, her son being diagnosed with depression, and job transition. 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  She endorses difficulty with short-term memory, especially when multitasking.    Out-side paper records, electronic medical record, and images have been reviewed where available and summarized as:  Lab Results  Component Value Date   TSH 2.710 04/19/2017    Past Medical History:  Diagnosis Date  . ADD (attention deficit disorder)   . Allergy   . Anemia   . Anxiety   . Hyperlipidemia   . Ileitis   . Lupus [facility])     Past Surgical History:  Procedure Laterality Date  . CESAREAN SECTION     x 2  . DENTAL SURGERY    . EYE SURGERY  Bilateral    cryoretinopexy     Medications:  Outpatient Encounter Medications as of 12/15/2017  Medication Sig Note  . acetaminophen (TYLENOL) 500 MG tablet Take 1,000 mg by mouth every 6 (six) hours as needed for mild pain.   Marland Kitchen albuterol (PROVENTIL HFA;VENTOLIN HFA) 108 (90 Base) MCG/ACT inhaler Inhale 2 puffs into the lungs every 6 (six) hours as needed for wheezing or shortness of breath.   . amphetamine-dextroamphetamine (ADDERALL) 10 MG tablet Take 10 mg once a day to twice daily.   Marland Kitchen azelastine (ASTELIN) 0.1 % nasal spray PLACE 2 SPRAYS INTO BOTH NOSTRILS TWICE DAILY   . Boric Acid Topical POWD Place vaginally. 03/06/2017: PRN  . buPROPion (WELLBUTRIN XL) 150 MG 24 hr tablet TK 1 T PO QAM FOR  7 DAYS  THEN TK 2 TS PO QAM   . DiphenhydrAMINE HCl (BENADRYL ALLERGY PO) Take by mouth as needed.   . hydroxychloroquine (PLAQUENIL) 200 MG tablet TAKE 1 TABLET BY MOUTH TWICE DAILY(Q 12 HOURS) ON MONDAY-FRIDAY   . loratadine (CLARITIN) 10 MG tablet Take 10 mg by mouth daily.   Ernestine Conrad 3-6-9 Fatty Acids (OMEGA-3-6-9 PO) Take 2 capsules by mouth daily.   . Probiotic Product (PROBIOTIC COMPLEX ACIDOPHILUS PO) Take 1 capsule by mouth daily.    . sodium chloride (OCEAN) 0.65 % nasal spray Place 1 spray into the nose daily as needed for congestion.    . TURMERIC PO Take by mouth 4 (four) times daily.   Marland Kitchen VITAMIN D, CHOLECALCIFEROL, PO Take 500  mg by mouth.   . [DISCONTINUED] guaiFENesin (MUCINEX) 600 MG 12 hr tablet Take by mouth as needed.   . [DISCONTINUED] VENTOLIN HFA 108 (90 Base) MCG/ACT inhaler INHALE 2 PUFFS INTO THE LUNGS EVERY 6 HOURS AS NEEDED FOR WHEEZING OR SHORTNESS OF BREATH    Facility-Administered Encounter Medications as of 12/15/2017  Medication  . magic mouthwash     Allergies:  Allergies  Allergen Reactions  . Avelox [Moxifloxacin Hcl In Nacl] Other (See Comments)    Muscle cramps  . Cefaclor Hives    Tolerates penicillins  . Latex Other (See Comments)    "Raw skin"  .  Red Wine Complex [Germanium]     Itching   . Meloxicam Rash  . Naproxen Rash    Family History: Family History  Problem Relation Age of Onset  . Heart disease Mother 44       CAD/stenting  . Hyperlipidemia Mother   . Arthritis Mother        ostearthritis  . Hypertension Mother   . Kidney disease Mother   . Irritable bowel syndrome Mother   . Stroke Mother 46       CVA  . Fibromyalgia Mother   . Hyperlipidemia Father   . Neuropathy Father   . Hyperlipidemia Sister   . Hypertension Brother   . Colon cancer Neg Hx   . Inflammatory bowel disease Neg Hx     Social History: Social History   Tobacco Use  . Smoking status: Former Smoker    Last attempt to quit: 07/05/2001    Years since quitting: 16.4  . Smokeless tobacco: Never Used  Substance Use Topics  . Alcohol use: Yes    Alcohol/week: 8.4 oz    Types: 14 Cans of beer per week    Comment: 2 beer daily  . Drug use: No   Social History   Social History Narrative   Marital status: married x 17 years; happily married      Children: 2 children (63, 66)      Lives: with husband, 2 children.      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   EYES: No visual changes or eye pain ENT: No hearing changes.  No history of nose bleeds.   RESPIRATORY: No cough, wheezing and shortness of breath.   CARDIOVASCULAR: Negative for chest pain, and palpitations.   GI: Negative for abdominal discomfort, blood in stools or black stools.  No recent change in bowel habits.   GU:  No history of incontinence.   MUSCLOSKELETAL: No history of joint pain or swelling.  No myalgias.   SKIN: Negative for lesions, rash, and itching.   HEMATOLOGY/ONCOLOGY: Negative for prolonged bleeding, bruising  easily, and swollen nodes.  No history of cancer.   ENDOCRINE: Negative for cold or heat intolerance, polydipsia or goiter.   PSYCH:  +depression +anxiety symptoms.   NEURO: As Above.   Vital Signs:  BP 100/68   Pulse 68   Ht 5' 6"  (1.676 m)   Wt 146 lb 6 oz (66.4 kg)   SpO2 98%   BMI 23.63 kg/m    General Medical Exam:   General:  Well  appearing, comfortable.   Eyes/ENT: see cranial nerve examination.   Neck: No masses appreciated.  Full range of motion without tenderness.  No carotid bruits. Respiratory:  Clear to auscultation, good air entry bilaterally.   Cardiac:  Regular rate and rhythm, no murmur.   Extremities:  No deformities, edema, or skin discoloration.  Skin:  No rashes or lesions.  Neurological Exam: MENTAL STATUS including orientation to time, place, person, recent and remote memory, attention span and concentration, language, and fund of knowledge is normal.  Speech is not dysarthric. Montreal Cognitive Assessment  12/15/2017  Visuospatial/ Executive (0/5) 5  Naming (0/3) 3  Attention: Read list of digits (0/2) 2  Attention: Read list of letters (0/1) 1  Attention: Serial 7 subtraction starting at 100 (0/3) 3  Language: Repeat phrase (0/2) 2  Language : Fluency (0/1) 1  Abstraction (0/2) 2  Delayed Recall (0/5) 1  Orientation (0/6) 6  Total 26  Adjusted Score (based on education) 26   CRANIAL NERVES: II:  No visual field defects.  Unremarkable fundi.   III-IV-VI: Pupils equal round and reactive to light.  Normal conjugate, extra-ocular eye movements in all directions of gaze.  No nystagmus.  No ptosis.   V:  Normal facial sensation.    VII:  Normal facial symmetry and movements.   VIII:  Normal hearing and vestibular function.   IX-X:  Normal palatal movement.   XI:  Normal shoulder shrug and head rotation.   XII:  Normal tongue strength and range of motion, no deviation or fasciculation.  MOTOR:  Motor strength is 5/5 throughout.  No atrophy,  fasciculations or abnormal movements.  No pronator drift.  Tone is normal.    MSRs:  Right                                                                 Left brachioradialis 2+  brachioradialis 2+  biceps 2+  biceps 2+  triceps 2+  triceps 2+  patellar 2+  patellar 2+  ankle jerk 2+  ankle jerk 2+  plantar response down  plantar response down   SENSORY:  Normal and symmetric perception of light touch, vibration, and temperature.    COORDINATION/GAIT: Normal finger-to- nose-finger.  Intact rapid alternating movements bilaterally.  Gait narrow based and stable. Tandem and stressed gait intact.    IMPRESSION: Word substitution and word-finding difficulty most likely contributed by underlying mood disorder (ADD, depression).  Reassured patient that I do not see anything worrisome on her exam. She scored 26/30 on cognitive testing, missing points in delayed recall.  Formal neurocognitive testing was offered, but she would like to continue ongoing care with her psychiatrist and counselor, and consider further testing, if there is no improvement.  I will check vitamin B12 and vitamin B1 for reversible causes of memory changes.  Recommend reducing alcohol consumption and encouraged her to remain physically active.     Return to clinic as needed.   Thank you for allowing me to participate in patient's care.  If I can answer any additional questions, I would be pleased to do so.    Sincerely,    Donika K. Posey Pronto, DO

## 2017-12-15 NOTE — Patient Instructions (Signed)
Check vitamin B12 and vitamin B1 level

## 2017-12-18 LAB — VITAMIN B1: Vitamin B1 (Thiamine): 9 nmol/L (ref 8–30)

## 2017-12-19 ENCOUNTER — Other Ambulatory Visit: Payer: Self-pay | Admitting: Rheumatology

## 2017-12-19 NOTE — Telephone Encounter (Addendum)
Last Visit: 08/31/17 Next Visit: 02/06/18 Labs: 08/31/17 Alkaline phosphatase is low. Please add phosphorus to her labs. WBC is low. We will monitor for right now. Phosphorus was normal.  PLQ eye exam on 07/06/16  Patient has an appointment May 2019.   Okay to refill per Dr. Estanislado Pandy

## 2017-12-28 DIAGNOSIS — F411 Generalized anxiety disorder: Secondary | ICD-10-CM | POA: Diagnosis not present

## 2018-01-18 DIAGNOSIS — F411 Generalized anxiety disorder: Secondary | ICD-10-CM | POA: Diagnosis not present

## 2018-01-22 DIAGNOSIS — F4010 Social phobia, unspecified: Secondary | ICD-10-CM | POA: Diagnosis not present

## 2018-01-22 DIAGNOSIS — F3340 Major depressive disorder, recurrent, in remission, unspecified: Secondary | ICD-10-CM | POA: Diagnosis not present

## 2018-01-22 DIAGNOSIS — F411 Generalized anxiety disorder: Secondary | ICD-10-CM | POA: Diagnosis not present

## 2018-01-22 DIAGNOSIS — F410 Panic disorder [episodic paroxysmal anxiety] without agoraphobia: Secondary | ICD-10-CM | POA: Diagnosis not present

## 2018-01-23 NOTE — Progress Notes (Signed)
Office Visit Note  Patient: Robin [name]             Date of Birth: Jan 23, 1971           MRN: [id number]             PCP: Wardell Honour, MD Referring: Wardell Honour, MD Visit Date: 02/06/2018 Occupation: @GUAROCC @    Subjective:  Right plantar fascia    History of Present Illness: Robin [name] is a 47 y.o. female with history of lupus erythematosus, Raynaud's, and plantar fasciitis.  She takes Plaquenil 200 mg BID Monday through Friday.  She reports less than one week ago she started having increased pain of the right plantar fascia. She reports right ankle swelling.  She has been running more frequently but gradually been increasing her miles.  She denies any injuries.  She states that her right plantar pressure has been very sore and stiff.  She states that she has been using a muscle roller which has been helping her symptoms somewhat.  She states she has foot pain first thing in the morning and if she is sedentary for a while.  She states that she saw her PCP who checked a sed rate 2 but she had had an ibuprofen before the test was drawn.  She started on a 6-day prednisone taper which helped with her pain.  She states she also was resting over the weekend.  She reports that she has been swimming for exercise instead of running.  She states she is also been sleeping with a brace that has been flexing her heel which has been also helping her symptoms.  She denies any cortisone injections in the past.  She reports that she is also having some pain and stiffness in her neck.  She states she has had a lot of stress at work which is led to muscle tension in this region.  She reports that a couple weeks ago she developed a rash on the flexor surface of her bilateral knees as well as ankles.  She states the rash lasted for 1 weeks and resolved on its own.  She denies applying anything topically.  She states she currently has a few mouth sores but denies any sores in her nose.  She denies any  swollen lymph nodes.  She denies any facial rashes.  She continues have sicca symptoms of feeling improving.  She states her symptoms of Raynaud's are improving as well.  She denies any flares of erythema nodosum.  She denies any shortness of breath or palpitations.  Denies any low-grade fevers recently.  She feels her fatigue has improved since starting Wellbutrin.  Activities of Daily Living:  Patient reports morning stiffness for 3 hours.   Patient Denies nocturnal pain.  Difficulty dressing/grooming: Reports Difficulty climbing stairs: Denies Difficulty getting out of chair: Denies Difficulty using hands for taps, buttons, cutlery, and/or writing: Reports   Review of Systems  Constitutional: Positive for fatigue.  HENT: Positive for mouth dryness. Negative for mouth sores and nose dryness.   Eyes: Positive for redness, itching and dryness. Negative for pain and visual disturbance.  Respiratory: Negative for cough, hemoptysis, shortness of breath and difficulty breathing.   Cardiovascular: Positive for swelling in legs/feet. Negative for chest pain, palpitations and hypertension.  Gastrointestinal: Positive for constipation. Negative for blood in stool and diarrhea.  Endocrine: Negative for increased urination.  Genitourinary: Negative for difficulty urinating and painful urination.  Musculoskeletal: Positive for morning stiffness and muscle  tenderness. Negative for arthralgias, joint pain, joint swelling, myalgias, muscle weakness and myalgias.  Skin: Positive for rash. Negative for color change, pallor, hair loss, nodules/bumps, skin tightness, ulcers and sensitivity to sunlight.  Allergic/Immunologic: Negative for susceptible to infections.  Neurological: Negative for dizziness, headaches and weakness.  Hematological: Negative for bruising/bleeding tendency and swollen glands.  Psychiatric/Behavioral: Positive for sleep disturbance. Negative for depressed mood. The patient is not  nervous/anxious.     PMFS History:  Patient Active Problem List   Diagnosis Date Noted  . History of hyperlipidemia 11/08/2016  . History of anxiety 11/08/2016  . History of retinal tear 11/08/2016  . History of Bilateral plantar fasciitis 08/10/2016  . Plantar fasciitis 07/04/2016  . Erythema nodosum 07/04/2016  . High risk medication use 07/04/2016  . Lupus ([place]) 05/23/2016  . 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 ADHD (attention deficit hyperactivity disorder) 12/22/2014  . Raynaud's phenomenon 10/17/2013  . Hyperlipidemia 09/28/2011    Past Medical History:  Diagnosis Date  . ADD (attention deficit disorder)   . Allergy   . Anemia   . Anxiety   . Hyperlipidemia   . Ileitis   . Lupus ([place])     Family History  Problem Relation Age of Onset  . Heart disease Mother 35       CAD/stenting  . Hyperlipidemia Mother   . Arthritis Mother        ostearthritis  . Hypertension Mother   . Kidney disease Mother   . Irritable bowel syndrome Mother   . Stroke Mother 35       CVA  . Fibromyalgia Mother   . Hyperlipidemia Father   . Neuropathy Father   . Hyperlipidemia Sister   . Hypertension Brother   . Colon cancer Neg Hx   . Inflammatory bowel disease Neg Hx    Past Surgical History:  Procedure Laterality Date  . CESAREAN SECTION     x 2  . DENTAL SURGERY    . EYE SURGERY Bilateral    cryoretinopexy   Social History   Social History Narrative   Marital status: married x 17 years; happily married      Children: 2 children (65, 38)      Lives: with husband, 2 children.      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 She appears well-developed and well-nourished.  HENT:  Head: Normocephalic and atraumatic.  No oral or nasal ulcerations.  No parotid swelling.   Eyes: Conjunctivae and EOM are normal.  Neck: Normal range of motion.  Cardiovascular: Normal rate, regular rhythm, normal heart sounds and intact distal pulses.  Pulmonary/Chest: Effort normal and breath sounds normal.  Abdominal: Soft. Bowel sounds are normal.  Lymphadenopathy:    She has no cervical adenopathy.  Neurological: She is alert and oriented to person, place, and time.  Skin: Skin is warm and dry. Capillary refill takes  less than 2 seconds.  No malar rash.  No digital ulcerations or signs of gangrene.   Psychiatric: She has a normal mood and affect. Her behavior is normal.  Nursing note and vitals reviewed.    Musculoskeletal Exam: C-spine, thoracic spine, lumbar spine good range of motion.  No midline spinal tenderness.  No SI joint tenderness.  Shoulder joints, elbow joints, wrist joints, MCPs, PIPs, DIPs good range of motion with no synovitis.  She has PIP and DIP synovial thickening consistent with osteoarthritis.  Hip joints, knee joints, ankle joints, MTPs, PIPs, DIPs good range of motion no synovitis.  No tenderness of trochanteric bursa.  No warmth or effusion of bilateral knees.  She has tenderness and warmth along the right plantar fascia.  No Achilles tendinitis.  CDAI Exam: No CDAI exam completed.    Investigation: No additional findings. CBC Latest Ref Rng & Units 08/31/2017 04/19/2017 03/28/2017  WBC 3.8 - 10.8 Thousand/uL 3.4(L) 4.9 4.3  Hemoglobin 11.7 - 15.5 g/dL 13.8 13.8 13.6  Hematocrit 35.0 - 45.0 % 40.8 42.0 42.1  Platelets 140 - 400 Thousand/uL 264 254 248   CMP Latest Ref Rng & Units 08/31/2017 04/19/2017 03/28/2017  Glucose 65 - 99 mg/dL 85 76 77  BUN 7 - 25 mg/dL 12 12 12   Creatinine 0.50 - 1.10 mg/dL 0.82  0.96 0.91  Sodium 135 - 146 mmol/L 137 137 135  Potassium 3.5 - 5.3 mmol/L 4.5 4.6 4.8  Chloride 98 - 110 mmol/L 101 100 101  CO2 20 - 32 mmol/L 29 23 23   Calcium 8.6 - 10.2 mg/dL 9.5 9.7 9.8  Total Protein 6.1 - 8.1 g/dL 7.4 7.6 7.3  Total Bilirubin 0.2 - 1.2 mg/dL 0.6 0.6 0.7  Alkaline Phos 39 - 117 IU/L - 27(L) 28(L)  AST 10 - 30 U/L 16 14 17   ALT 6 - 29 U/L 8 7 9     Imaging: Dg Foot Complete Right  Result Date: 02/02/2018 CLINICAL DATA:  Plantar fasciitis EXAM: RIGHT FOOT COMPLETE - 3+ VIEW COMPARISON:  04/26/2016 FINDINGS: There is no evidence of fracture or dislocation. There is a small plantar calcaneal spur. There is no evidence of arthropathy or other focal bone abnormality. Soft tissues are unremarkable. IMPRESSION: No acute osseous injury of the right foot. Electronically Signed   By: Kathreen Devoid   On: 02/02/2018 12:25    Speciality Comments: No specialty comments available.    Procedures:  No procedures performed Allergies: Avelox [moxifloxacin hcl in nacl]; Cefaclor; Latex; Red wine complex [germanium]; Meloxicam; and Naproxen   Assessment / Plan:     Visit Diagnoses: Other systemic lupus erythematosus with other organ involvement ([place]) -She has not had any recent flares of lupus.  She continues to take Plaquenil 200 mg twice daily Monday through Friday.  She has not missed recent doses.  She has been having mouth ulcerations periodically but has not been using magic mouthwash.  She has no oral or nasal ulcerations on exam today.  Her sicca symptoms have been improving.  She has no parotid swelling or tenderness.  Her symptoms of Raynaud's have been improving.  No signs of gangrene or digital ulcerations.  She had a rash on the flexor surfaces of bilateral knees and ankle joints that resolved on its own after 1 week.  No malar rash noted.  She denies any fevers, shortness of breath, or palpitations.  Her fatigue has been improving since starting on Wellbutrin.  She had a  flare of right plantar fasciitis  starting less than 1 week ago.  She has warmth and tenderness of the right plantar fascia on exam. She was given a handout of exercises and a referral was placed to physical therapy.  She has no synovitis on exam.  she has not had any increased joint pain or joint swelling.  Autoimmune labs will be checked today.  Plan: CBC with Differential/Platelet, COMPLETE METABOLIC PANEL WITH GFR, Urinalysis, Routine w reflex microscopic, ANA, Anti-DNA antibody, double-stranded, C3 and C4, Sedimentation rate, VITAMIN D 25 Hydroxy (Vit-D Deficiency, Fractures)  High risk medication use - Plaquenil 200 mg by mouth twice a day Monday to [facility] and CMP will be drawn today to monitor for drug toxicity.  She is scheduled for a Plaquenil eye exam in June 2019.- Plan: CBC with Differential/Platelet, COMPLETE METABOLIC PANEL WITH GFR  Raynaud's phenomenon without gangrene: Her symptoms of Raynauds have improved.  She has no digital ulcerations or signs of gangrene.  Erythema nodosum: She has not had any recent flares.  She has no nodules at this time.  Plantar fasciitis-She has warmth and tenderness of the right plantar fascia.  She was given a handout of exercises that she can perform at home.  She will continue also using her muscle roller and wearing a night brace.  She is encouraged to wear supportive shoes and take a break from running.  She will be swimming for exercise.  A referral was placed to physical therapy for evaluation and treatment.  Plan: Ambulatory referral to Physical Therapy  Other fatigue: Her fatigue has improved since starting on Wellbutrin.  Neck stiffness: She has trapezius muscle tension and tenderness.  She was given a handout of neck exercises that she can perform at home.   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 CBC with Differential/Platelet  . COMPLETE METABOLIC PANEL WITH GFR  . Urinalysis, Routine w reflex microscopic  . ANA  . Anti-DNA antibody, double-stranded  . C3 and C4  . Sedimentation rate  . VITAMIN D 25 Hydroxy (Vit-D Deficiency, Fractures)  . Ambulatory referral to Physical Therapy   Meds ordered this encounter  Medications  . hydroxychloroquine (PLAQUENIL) 200 MG tablet    Sig: TAKE 1 TABLET BY MOUTH TWICE DAILY(Q 12 HOURS) ON MONDAY-FRIDAY    Dispense:  40 tablet    Refill:  2    Face-to-face time spent with patient was 30 minutes. >50% of time was spent in counseling and coordination of care.  Follow-Up Instructions: Return in about 5 months (around 07/09/2018) for Systemic lupus erythematosus.   Ofilia Neas, PA-C  Note - This record has been created using Dragon software.  Chart creation errors have been sought, but may not always  have been located. Such creation errors do not reflect on  the standard of medical care.

## 2018-01-25 DIAGNOSIS — F411 Generalized anxiety disorder: Secondary | ICD-10-CM | POA: Diagnosis not present

## 2018-02-01 ENCOUNTER — Encounter: Payer: Self-pay | Admitting: Family Medicine

## 2018-02-02 ENCOUNTER — Encounter: Payer: Self-pay | Admitting: Physician Assistant

## 2018-02-02 ENCOUNTER — Ambulatory Visit: Payer: BLUE CROSS/BLUE SHIELD | Admitting: Physician Assistant

## 2018-02-02 ENCOUNTER — Ambulatory Visit (INDEPENDENT_AMBULATORY_CARE_PROVIDER_SITE_OTHER): Payer: BLUE CROSS/BLUE SHIELD

## 2018-02-02 ENCOUNTER — Other Ambulatory Visit: Payer: Self-pay

## 2018-02-02 VITALS — BP 102/62 | HR 68 | Temp 97.6°F | Ht 66.0 in | Wt 147.4 lb

## 2018-02-02 DIAGNOSIS — M79671 Pain in right foot: Secondary | ICD-10-CM

## 2018-02-02 DIAGNOSIS — M722 Plantar fascial fibromatosis: Secondary | ICD-10-CM | POA: Diagnosis not present

## 2018-02-02 MED ORDER — PREDNISONE 20 MG PO TABS: ORAL_TABLET | ORAL | 0 refills | Status: AC

## 2018-02-02 NOTE — Patient Instructions (Signed)
Xray is normal.  Prednisolone is waiting. Start today and dont mix in other nsaids.  Come back in about two weeks if needed.

## 2018-02-02 NOTE — Progress Notes (Signed)
02/02/2018 1:55 PM   DOB: 1970/10/01 / MRN: [id number]  SUBJECTIVE:  Robin [name] is a 47 y.o. female presenting for worsening right heal pain that started yesterday. Made worse with ambulation. Hx of lupus and is managed by rheumatology.  No constitutional symptoms today.   She is allergic to avelox [moxifloxacin hcl in nacl]; cefaclor; latex; red wine complex [germanium]; meloxicam; and naproxen.   She  has a past medical history of ADD (attention deficit disorder), Allergy, Anemia, Anxiety, Hyperlipidemia, Ileitis, and Lupus ([place]).    She  reports that she quit smoking about 16 years ago. She has never used smokeless tobacco. She reports that she drinks about 8.4 oz of alcohol per week. She reports that she does not use drugs. She  reports that she currently engages in sexual activity. She reports using the following method of birth control/protection: Condom. The patient  has a past surgical history that includes Eye surgery (Bilateral); Cesarean section; and Dental surgery.  Her family history includes Arthritis in her mother; Fibromyalgia in her mother; Heart disease (age of onset: 58) in her mother; Hyperlipidemia in her father, mother, and sister; Hypertension in her brother and mother; Irritable bowel syndrome in her mother; Kidney disease in her mother; Neuropathy in her father; Stroke (age of onset: 65) in her mother.  Review of Systems  Constitutional: Negative for chills, diaphoresis and fever.  Gastrointestinal: Negative for nausea.  Musculoskeletal: Positive for joint pain. Negative for falls.  Skin: Negative for rash.  Neurological: Negative for dizziness.    The problem list and medications were reviewed and updated by myself where necessary and exist elsewhere in the encounter.   OBJECTIVE:  BP 102/62 (BP Location: Right Arm, Patient Position: Sitting, Cuff Size: Normal)   Pulse 68   Temp 97.6 F (36.4 C) (Oral)   Ht 5' 6"  (1.676 m)   Wt 147 lb 6.4 oz (66.9 kg)    LMP 01/31/2018   SpO2 100%   BMI 23.79 kg/m   Physical Exam  Constitutional: She is oriented to person, place, and time. She appears well-nourished. No distress.  Eyes: Pupils are equal, round, and reactive to light. EOM are normal.  Cardiovascular: Normal rate.  Pulmonary/Chest: Effort normal.  Abdominal: She exhibits no distension.  Musculoskeletal:       Feet:  Neurological: She is alert and oriented to person, place, and time. No cranial nerve deficit. Gait normal.  Skin: Skin is dry. She is not diaphoretic.  Psychiatric: She has a normal mood and affect.  Vitals reviewed.   No results found for this or any previous visit (from the past 72 hour(s)).  Dg Foot Complete Right  Result Date: 02/02/2018 CLINICAL DATA:  Plantar fasciitis EXAM: RIGHT FOOT COMPLETE - 3+ VIEW COMPARISON:  04/26/2016 FINDINGS: There is no evidence of fracture or dislocation. There is a small plantar calcaneal spur. There is no evidence of arthropathy or other focal bone abnormality. Soft tissues are unremarkable. IMPRESSION: No acute osseous injury of the right foot. Electronically Signed   By: Kathreen Devoid   On: 02/02/2018 12:25    ASSESSMENT AND PLAN:  Juni was seen today for foot pain.  Diagnoses and all orders for this visit:  Pain of right heel: Patient concerned for lupus flare. Sed rate drawn.  Starting pred as this should help with plantar fascitis as well as lupus flare. Will release results to mychart.  If sed rate elevated with advise she discuss with rheum.  If negative then  she will come back here if the problem does not improve to explore other options.  Would also consider podiatry.  -     Sedimentation Rate -     predniSONE (DELTASONE) 20 MG tablet; Take 3 in the morning for 3 days, then 2 in the morning for 3 days, and then 1 in the morning for 3 days. -     DG Foot Complete Right; Future    The patient is advised to call or return to clinic if she does not see an improvement  in symptoms, or to seek the care of the closest emergency department if she worsens with the above plan.   Philis Fendt, MHS, PA-C Primary [facility] at [facility] 02/02/2018 1:55 PM

## 2018-02-03 LAB — SEDIMENTATION RATE: Sed Rate: 2 mm/hr (ref 0–32)

## 2018-02-06 ENCOUNTER — Ambulatory Visit: Payer: BLUE CROSS/BLUE SHIELD | Admitting: Physician Assistant

## 2018-02-06 ENCOUNTER — Encounter: Payer: Self-pay | Admitting: Physician Assistant

## 2018-02-06 VITALS — BP 105/61 | HR 75 | Resp 14 | Ht 66.0 in | Wt 150.0 lb

## 2018-02-06 DIAGNOSIS — F900 Attention-deficit hyperactivity disorder, predominantly inattentive type: Secondary | ICD-10-CM

## 2018-02-06 DIAGNOSIS — M722 Plantar fascial fibromatosis: Secondary | ICD-10-CM

## 2018-02-06 DIAGNOSIS — Z8669 Personal history of other diseases of the nervous system and sense organs: Secondary | ICD-10-CM | POA: Diagnosis not present

## 2018-02-06 DIAGNOSIS — R5383 Other fatigue: Secondary | ICD-10-CM

## 2018-02-06 DIAGNOSIS — Z8659 Personal history of other mental and behavioral disorders: Secondary | ICD-10-CM | POA: Diagnosis not present

## 2018-02-06 DIAGNOSIS — Z79899 Other long term (current) drug therapy: Secondary | ICD-10-CM

## 2018-02-06 DIAGNOSIS — L52 Erythema nodosum: Secondary | ICD-10-CM

## 2018-02-06 DIAGNOSIS — I7300 Raynaud's syndrome without gangrene: Secondary | ICD-10-CM | POA: Diagnosis not present

## 2018-02-06 DIAGNOSIS — M436 Torticollis: Secondary | ICD-10-CM

## 2018-02-06 DIAGNOSIS — M3219 Other organ or system involvement in systemic lupus erythematosus: Secondary | ICD-10-CM

## 2018-02-06 DIAGNOSIS — Z8639 Personal history of other endocrine, nutritional and metabolic disease: Secondary | ICD-10-CM

## 2018-02-06 DIAGNOSIS — K5000 Crohn's disease of small intestine without complications: Secondary | ICD-10-CM

## 2018-02-06 MED ORDER — HYDROXYCHLOROQUINE SULFATE 200 MG PO TABS: ORAL_TABLET | ORAL | 2 refills | Status: DC

## 2018-02-06 NOTE — Patient Instructions (Signed)
Plantar Fasciitis Rehab Ask your health care provider which exercises are safe for you. Do exercises exactly as told by your health care provider and adjust them as directed. It is normal to feel mild stretching, pulling, tightness, or discomfort as you do these exercises, but you should stop right away if you feel sudden pain or your pain gets worse. Do not begin these exercises until told by your health care provider. Stretching and range of motion exercises These exercises warm up your muscles and joints and improve the movement and flexibility of your foot. These exercises also help to relieve pain. Neck Exercises Neck exercises can be important for many reasons:  They can help you to improve and maintain flexibility in your neck. This can be especially important as you age.  They can help to make your neck stronger. This can make movement easier.  They can reduce or prevent neck pain.  They may help your upper back.  Ask your health care provider which neck exercises would be best for you. Exercises Neck Press Repeat this exercise 10 times. Do it first thing in the morning and right before bed or as told by your health care provider. 1. Lie on your back on a firm bed or on the floor with a pillow under your head. 2. Use your neck muscles to push your head down on the pillow and straighten your spine. 3. Hold the position as well as you can. Keep your head facing up and your chin tucked. 4. Slowly count to 5 while holding this position. 5. Relax for a few seconds. Then repeat.  Isometric Strengthening Do a full set of these exercises 2 times a day or as told by your health care provider. 1. Sit in a supportive chair and place your hand on your forehead. 2. Push forward with your head and neck while pushing back with your hand. Hold for 10 seconds. 3. Relax. Then repeat the exercise 3 times. 4. Next, do thesequence again, this time putting your hand against the back of your head.  Use your head and neck to push backward against the hand pressure. 5. Finally, do the same exercise on either side of your head, pushing sideways against the pressure of your hand.  Prone Head Lifts Repeat this exercise 5 times. Do this 2 times a day or as told by your health care provider. 1. Lie face-down, resting on your elbows so that your chest and upper back are raised. 2. Start with your head facing downward, near your chest. Position your chin either on or near your chest. 3. Slowly lift your head upward. Lift until you are looking straight ahead. Then continue lifting your head as far back as you can stretch. 4. Hold your head up for 5 seconds. Then slowly lower it to your starting position.  Supine Head Lifts Repeat this exercise 8-10 times. Do this 2 times a day or as told by your health care provider. 1. Lie on your back, bending your knees to point to the ceiling and keeping your feet flat on the floor. 2. Lift your head slowly off the floor, raising your chin toward your chest. 3. Hold for 5 seconds. 4. Relax and repeat.  Scapular Retraction Repeat this exercise 5 times. Do this 2 times a day or as told by your health care provider. 1. Stand with your arms at your sides. Look straight ahead. 2. Slowly pull both shoulders backward and downward until you feel a stretch between your shoulder  blades in your upper back. 3. Hold for 10-30 seconds. 4. Relax and repeat.  Contact a health care provider if:  Your neck pain or discomfort gets much worse when you do an exercise.  Your neck pain or discomfort does not improve within 2 hours after you exercise. If you have any of these problems, stop exercising right away. Do not do the exercises again unless your health care provider says that you can. Get help right away if:  You develop sudden, severe neck pain. If this happens, stop exercising right away. Do not do the exercises again unless your health care provider says that you  can. Exercises Neck Stretch  Repeat this exercise 3-5 times. 1. Do this exercise while standing or while sitting in a chair. 2. Place your feet flat on the floor, shoulder-width apart. 3. Slowly turn your head to the right. Turn it all the way to the right so you can look over your right shoulder. Do not tilt or tip your head. 4. Hold this position for 10-30 seconds. 5. Slowly turn your head to the left, to look over your left shoulder. 6. Hold this position for 10-30 seconds.  Neck Retraction Repeat this exercise 8-10 times. Do this 3-4 times a day or as told by your health care provider. 1. Do this exercise while standing or while sitting in a sturdy chair. 2. Look straight ahead. Do not bend your neck. 3. Use your fingers to push your chin backward. Do not bend your neck for this movement. Continue to face straight ahead. If you are doing the exercise properly, you will feel a slight sensation in your throat and a stretch at the back of your neck. 4. Hold the stretch for 1-2 seconds. Relax and repeat.  This information is not intended to replace advice given to you by your health care provider. Make sure you discuss any questions you have with your health care provider. Document Released: 08/10/2015 Document Revised: 02/04/2016 Document Reviewed: 03/09/2015 Elsevier Interactive Patient Education  2018 [employer].  Exercise A: Plantar fascia stretch  1. Sit with your left / right leg crossed over your opposite knee. 2. Hold your heel with one hand with that thumb near your arch. With your other hand, hold your toes and gently pull them back toward the top of your foot. You should feel a stretch on the bottom of your toes or your foot or both. 3. Hold this stretch for__________ seconds. 4. Slowly release your toes and return to the starting position. Repeat __________ times. Complete this exercise __________ times a day. Exercise B: Gastroc, standing  1. Stand with your hands  against a wall. 2. Extend your left / right leg behind you, and bend your front knee slightly. 3. Keeping your heels on the floor and keeping your back knee straight, shift your weight toward the wall without arching your back. You should feel a gentle stretch in your left / right calf. 4. Hold this position for __________ seconds. Repeat __________ times. Complete this exercise __________ times a day. Exercise C: Soleus, standing 1. Stand with your hands against a wall. 2. Extend your left / right leg behind you, and bend your front knee slightly. 3. Keeping your heels on the floor, bend your back knee and slightly shift your weight over the back leg. You should feel a gentle stretch deep in your calf. 4. Hold this position for __________ seconds. Repeat __________ times. Complete this exercise __________ times a day. Exercise D:  Gastrocsoleus, standing 1. Stand with the ball of your left / right foot on a step. The ball of your foot is on the walking surface, right under your toes. 2. Keep your other foot firmly on the same step. 3. Hold onto the wall or a railing for balance. 4. Slowly lift your other foot, allowing your body weight to press your heel down over the edge of the step. You should feel a stretch in your left / right calf. 5. Hold this position for __________ seconds. 6. Return both feet to the step. 7. Repeat this exercise with a slight bend in your left / right knee. Repeat __________ times with your left / right knee straight and __________ times with your left / right knee bent. Complete this exercise __________ times a day. Balance exercise This exercise builds your balance and strength control of your arch to help take pressure off your plantar fascia. Exercise E: Single leg stand 1. Without shoes, stand near a railing or in a doorway. You may hold onto the railing or door frame as needed. 2. Stand on your left / right foot. Keep your big toe down on the floor and try to  keep your arch lifted. Do not let your foot roll inward. 3. Hold this position for __________ seconds. 4. If this exercise is too easy, you can try it with your eyes closed or while standing on a pillow. Repeat __________ times. Complete this exercise __________ times a day. This information is not intended to replace advice given to you by your health care provider. Make sure you discuss any questions you have with your health care provider. Document Released: 08/29/2005 Document Revised: 05/03/2016 Document Reviewed: 07/13/2015 Elsevier Interactive Patient Education  2018 [employer].

## 2018-02-08 LAB — COMPLETE METABOLIC PANEL WITH GFR
AG Ratio: 2 (calc) (ref 1.0–2.5)
ALT: 9 U/L (ref 6–29)
AST: 13 U/L (ref 10–30)
Albumin: 4.3 g/dL (ref 3.6–5.1)
Alkaline phosphatase (APISO): 24 U/L — ABNORMAL LOW (ref 33–115)
BUN: 14 mg/dL (ref 7–25)
CO2: 31 mmol/L (ref 20–32)
Calcium: 9.1 mg/dL (ref 8.6–10.2)
Chloride: 104 mmol/L (ref 98–110)
Creat: 0.8 mg/dL (ref 0.50–1.10)
GFR, Est African American: 104 mL/min/{1.73_m2} (ref >=60)
GFR, Est Non African American: 90 mL/min/{1.73_m2} (ref >=60)
Globulin: 2.2 g/dL (calc) (ref 1.9–3.7)
Glucose, Bld: 73 mg/dL (ref 65–99)
Potassium: 4 mmol/L (ref 3.5–5.3)
Sodium: 139 mmol/L (ref 135–146)
Total Bilirubin: 0.5 mg/dL (ref 0.2–1.2)
Total Protein: 6.5 g/dL (ref 6.1–8.1)

## 2018-02-08 LAB — CBC WITH DIFFERENTIAL/PLATELET
Basophils Absolute: 17 cells/uL (ref 0–200)
Basophils Relative: 0.3 %
Eosinophils Absolute: 52 cells/uL (ref 15–500)
Eosinophils Relative: 0.9 %
HCT: 35.5 % (ref 35.0–45.0)
Hemoglobin: 12.1 g/dL (ref 11.7–15.5)
Lymphs Abs: 922 cells/uL (ref 850–3900)
MCH: 31.6 pg (ref 27.0–33.0)
MCHC: 34.1 g/dL (ref 32.0–36.0)
MCV: 92.7 fL (ref 80.0–100.0)
MPV: 10.1 fL (ref 7.5–12.5)
Monocytes Relative: 6.6 %
Neutro Abs: 4425 cells/uL (ref 1500–7800)
Neutrophils Relative %: 76.3 %
Platelets: 236 10*3/uL (ref 140–400)
RBC: 3.83 10*6/uL (ref 3.80–5.10)
RDW: 12.8 % (ref 11.0–15.0)
Total Lymphocyte: 15.9 %
WBC mixed population: 383 cells/uL (ref 200–950)
WBC: 5.8 10*3/uL (ref 3.8–10.8)

## 2018-02-08 LAB — C3 AND C4
C3 Complement: 79 mg/dL — ABNORMAL LOW (ref 83–193)
C4 Complement: 21 mg/dL (ref 15–57)

## 2018-02-08 LAB — ANTI-NUCLEAR AB-TITER (ANA TITER): ANA Titer 1: 1:160 {titer} — ABNORMAL HIGH

## 2018-02-08 LAB — URINALYSIS, ROUTINE W REFLEX MICROSCOPIC
Bilirubin Urine: NEGATIVE
Glucose, UA: NEGATIVE
Hgb urine dipstick: NEGATIVE
Ketones, ur: NEGATIVE
Leukocytes, UA: NEGATIVE
Nitrite: NEGATIVE
Protein, ur: NEGATIVE
Specific Gravity, Urine: 1.006 (ref 1.001–1.03)
pH: 6.5 (ref 5.0–8.0)

## 2018-02-08 LAB — ANA: Anti Nuclear Antibody(ANA): POSITIVE — AB

## 2018-02-08 LAB — VITAMIN D 25 HYDROXY (VIT D DEFICIENCY, FRACTURES): Vit D, 25-Hydroxy: 43 ng/mL (ref 30–100)

## 2018-02-08 LAB — SEDIMENTATION RATE: Sed Rate: 2 mm/h (ref 0–20)

## 2018-02-08 LAB — ANTI-DNA ANTIBODY, DOUBLE-STRANDED: ds DNA Ab: 1 IU/mL

## 2018-02-08 NOTE — Progress Notes (Signed)
CBC WNL. Alk phos stable.  ANA titer stable.  C3 mildly low.  DsDNA negative. Sed rate WNL. UA WNL. Vitamin D is 43.  Please advise her to continue taking maintenance dose of vitamin D.

## 2018-02-15 ENCOUNTER — Telehealth: Payer: Self-pay | Admitting: Rheumatology

## 2018-02-15 NOTE — Telephone Encounter (Signed)
Patient called stating she was returning your call.   °

## 2018-02-15 NOTE — Telephone Encounter (Signed)
Patient advised of lab results and recommendations. Patient verbalized understanding.

## 2018-02-16 ENCOUNTER — Encounter: Payer: Self-pay | Admitting: Physical Therapy

## 2018-02-16 ENCOUNTER — Other Ambulatory Visit: Payer: Self-pay

## 2018-02-16 ENCOUNTER — Ambulatory Visit: Payer: BLUE CROSS/BLUE SHIELD | Attending: Physician Assistant | Admitting: Physical Therapy

## 2018-02-16 DIAGNOSIS — M79671 Pain in right foot: Secondary | ICD-10-CM | POA: Insufficient documentation

## 2018-02-16 DIAGNOSIS — M6281 Muscle weakness (generalized): Secondary | ICD-10-CM | POA: Diagnosis not present

## 2018-02-16 DIAGNOSIS — R252 Cramp and spasm: Secondary | ICD-10-CM | POA: Diagnosis not present

## 2018-02-16 DIAGNOSIS — Z79899 Other long term (current) drug therapy: Secondary | ICD-10-CM | POA: Diagnosis not present

## 2018-02-16 DIAGNOSIS — Z0100 Encounter for examination of eyes and vision without abnormal findings: Secondary | ICD-10-CM | POA: Diagnosis not present

## 2018-02-16 NOTE — Patient Instructions (Signed)

## 2018-02-16 NOTE — Therapy (Signed)
[facility] [address], [place], [place], [address] Phone: [phone]   Fax:  [phone]  Physical Therapy Evaluation  Patient Details  Name: Robin [name] MRN: [id number] Date of Birth: 07-25-1971 Referring Provider: Ofilia Neas, PA-C   Encounter Date: 02/16/2018  PT End of Session - 02/16/18 0839    Visit Number  1    Date for PT Re-Evaluation  03/30/18    Authorization Type  [employer] $45 copay    PT Start Time  0839    PT Stop Time  0929    PT Time Calculation (min)  50 min    Activity Tolerance  Patient tolerated treatment well    Behavior During Therapy  [facility] for tasks assessed/performed       Past Medical History:  Diagnosis Date  . ADD (attention deficit disorder)   . Allergy   . Anemia   . Anxiety   . Hyperlipidemia   . Ileitis   . Lupus [facility])     Past Surgical History:  Procedure Laterality Date  . CESAREAN SECTION     x 2  . DENTAL SURGERY    . EYE SURGERY Bilateral    cryoretinopexy    There were no vitals filed for this visit.   Subjective Assessment - 02/16/18 0846    Subjective  A couple weeks ago all of sudden very tight and sore when waking up.  Added one short 20 min run.  Had a diastasis and lupus flare affected my fitness because of not being able to eat and exercises.    Pertinent History  lupus, c-section x 2     How long can you stand comfortably?  20-30 min    Patient Stated Goals  be able to start running again    Currently in Pain?  Yes    Pain Score  4  worst in AM up to 7/10    Pain Location  Foot    Pain Orientation  Right    Pain Descriptors / Indicators  Sore;Tightness    Pain Type  Acute pain    Pain Onset  1 to 4 weeks ago    Pain Frequency  Intermittent    Aggravating Factors   standing a long time    Pain Relieving Factors  massage and ice    Effect of Pain on Daily Activities  participating in recreation/sports    Multiple Pain Sites  No          [facility] PT Assessment - 02/16/18 0001      Assessment   Medical Diagnosis  M72.2 (ICD-10-CM) - Plantar fasciitis right    Referring Provider  Ofilia Neas, PA-C    Onset Date/Surgical Date  02/02/18    Prior Therapy  No      Precautions   Precautions  None      Restrictions   Weight Bearing Restrictions  No      Balance Screen   Has the patient fallen in the past 6 months  No      [place] residence    Living Arrangements  Spouse/significant other;Children      Prior Function   Level of Independence  Independent    Vocation  Full time employment    Engineer, 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      Cognition   Overall Cognitive Status  Within Functional Limits for tasks assessed  Observation/Other Assessments   Focus on Therapeutic Outcomes (FOTO)   53% limited      Posture/Postural Control   Posture/Postural Control  Postural limitations    Postural Limitations  Rounded Shoulders      ROM / Strength   AROM / PROM / Strength  AROM;PROM;Strength      AROM   Overall AROM Comments  WNL ankle, knee, hip (slightly hypermobile)      PROM   Overall PROM Comments  WNL      Strength   Overall Strength Comments  right toe extension 4/5; core strength limited apparent with posture/ collapsed ribcage in standing/sitting    Strength Assessment Site  Hip;Knee;Ankle    Right/Left Ankle  Right;Left    Right Ankle Dorsiflexion  4+/5    Right Ankle Plantar Flexion  5/5    Right Ankle Inversion  5/5    Right Ankle Eversion  5/5    Left Ankle Dorsiflexion  5/5    Left Ankle Plantar Flexion  5/5    Left Ankle Inversion  5/5    Left Ankle Eversion  5/5      Flexibility   Soft Tissue Assessment /Muscle Length  yes WNL      Palpation   Palpation comment  tender and tight throughout right calf, gastroc/soleus, intrinsic foot muscles, fascia tender at insertion      Special Tests    Other special tests  single leg standing with excessive toe flexion bilaterally      Ambulation/Gait   Gait Pattern  Within Functional Limits                Objective measurements completed on examination: See above findings.      [place] Adult PT Treatment/Exercise - 02/16/18 0001      Self-Care   Self-Care  Other Self-Care Comments    Other Self-Care Comments   reviewed stretches for gastroc/soleus, plantar fascia, icing, dry needling aftercare      Manual Therapy   Manual Therapy  Soft tissue mobilization    Soft tissue mobilization  gastroc and soleus       Trigger Point Dry Needling - 02/16/18 1218    Consent Given?  Yes    Education Handout Provided  Yes    Muscles Treated Lower Body  Soleus;Gastrocnemius    Gastrocnemius Response  Palpable increased muscle length;Twitch response elicited    Soleus Response  Twitch response elicited;Palpable increased muscle length           PT Education - 02/16/18 0931    Education Details  dry needling aftercare    Person(s) Educated  Patient    Methods  Explanation;Handout    Comprehension  Verbalized understanding          PT Long Term Goals - 02/16/18 1231      PT LONG TERM GOAL #1   Title  ind with advanced HEP    Time  6    Period  Weeks    Status  New    Target Date  03/30/18      PT LONG TERM GOAL #2   Title  FOTO < or = to 29% limited    Time  6    Period  Weeks    Status  New    Target Date  03/30/18      PT LONG TERM GOAL #3   Title  able to return to running 10 minutes 3 days/week without increased pain    Time  6  Period  Weeks    Status  New    Target Date  03/30/18      PT LONG TERM GOAL #4   Title  reports 50% reduction of pain in the morning and at the end of the day due to decreased muscle spasms    Time  6    Period  Weeks    Status  New    Target Date  03/30/18      PT LONG TERM GOAL #5   Title  able to maintain arch with single leg stand without excessive toe flexion     Time  6    Period  Weeks    Status  New    Target Date  03/30/18             Plan - 02/16/18 1221    Clinical Impression Statement  Pt presents to clinic due to onset of foot and calf pain two weeks ago.  Pt was diagnosed with plantarfasciitis and has been stretching and icing.  Pt is a runner and currently unable to run.  Pt has LE weakness in right foot.  She has muscle spasms throughout right LE including gastroc, soleus, and other intrinsis foot muscles.  Pt responded well to dry needling treatment provided today with decreased symptoms after.  Pt has some rounded shoulder and core strength deficits.  Pt will benefit from skilled PT to address impairments and return to normal active and healthy lifestyle.    History and Personal Factors relevant to plan of care:  lupus and abdominal surgeries    Clinical Presentation  Stable    Clinical Presentation due to:  pt is stable    Clinical Decision Making  Low    Rehab Potential  Good    Clinical Impairments Affecting Rehab Potential  potential autoimmune disease flare ups    PT Frequency  1x / week    PT Duration  6 weeks    PT Treatment/Interventions  ADLs/Self Care Home Management;Cryotherapy;Electrical Stimulation;Iontophoresis 9m/ml Dexamethasone;Moist Heat;Therapeutic activities;Therapeutic exercise;Neuromuscular re-education;Patient/family education;Manual techniques;Passive range of motion;Dry needling;Taping    PT Next Visit Plan  f/u on dry needle to gastroc/soleus and add quadrdatus plantea, flexor digi brev, posterior tib, anterior tib, peroneals, strengthening toe extension in single leg and squats, check gait on TM if pain subsided, core strengthening    Recommended Other Services  eval 03/31/18    Consulted and Agree with Plan of Care  Patient       Patient will benefit from skilled therapeutic intervention in order to improve the following deficits and impairments:  Pain, Decreased strength, Increased muscle spasms,  Hypermobility  Visit Diagnosis: Pain in right foot - Plan: PT plan of care cert/re-cert  Cramp and spasm - Plan: PT plan of care cert/re-cert  Muscle weakness (generalized) - Plan: PT plan of care cert/re-cert     Problem List Patient Active Problem List   Diagnosis Date Noted  . History of hyperlipidemia 11/08/2016  . History of anxiety 11/08/2016  . History of retinal tear 11/08/2016  . History of Bilateral plantar fasciitis 08/10/2016  . Plantar fasciitis 07/04/2016  . Erythema nodosum 07/04/2016  . High risk medication use 07/04/2016  . [place] 05/23/2016  . 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[place] 03/18/2016  . ADHD (attention deficit hyperactivity disorder) 12/22/2014  . Raynaud's phenomenon 10/17/2013  . Hyperlipidemia 09/28/2011    JZannie Cove PT 02/16/2018, 12:38 PM  [facility] [address] [place] [place] [address]  Phone: [phone]   Fax:  [phone]  Name: Robin [name] MRN: [id number] Date of Birth: [birth year]

## 2018-02-17 ENCOUNTER — Other Ambulatory Visit: Payer: Self-pay | Admitting: Rheumatology

## 2018-02-19 NOTE — Telephone Encounter (Signed)
Last visit: 02/06/18 Next Visit: 08/13/18 Labs: 02/06/18 CBC WNL . Alk phos stable PLQ eye exam on 07/06/16   Okay to refill per Dr. Deveshwar 

## 2018-02-23 ENCOUNTER — Encounter: Payer: BLUE CROSS/BLUE SHIELD | Admitting: Physical Therapy

## 2018-02-26 DIAGNOSIS — F411 Generalized anxiety disorder: Secondary | ICD-10-CM | POA: Diagnosis not present

## 2018-02-28 ENCOUNTER — Ambulatory Visit: Payer: BLUE CROSS/BLUE SHIELD | Admitting: Physical Therapy

## 2018-02-28 ENCOUNTER — Encounter: Payer: Self-pay | Admitting: Physical Therapy

## 2018-02-28 DIAGNOSIS — M6281 Muscle weakness (generalized): Secondary | ICD-10-CM | POA: Diagnosis not present

## 2018-02-28 DIAGNOSIS — R252 Cramp and spasm: Secondary | ICD-10-CM | POA: Diagnosis not present

## 2018-02-28 DIAGNOSIS — M79671 Pain in right foot: Secondary | ICD-10-CM

## 2018-02-28 NOTE — Therapy (Signed)
[facility] [address], [place], [place], [address] Phone: [phone]   Fax:  [phone]  Physical Therapy Treatment  Patient Details  Name: Robin [name] MRN: [id number] Date of Birth: 1971/04/04 Referring Provider: Ofilia Neas, PA-C   Encounter Date: 02/28/2018  PT End of Session - 02/28/18 1042    Visit Number  2    Date for PT Re-Evaluation  03/30/18    Authorization Type  BCBS $45 copay    PT Start Time  0853 dry needling    PT Stop Time  0930    PT Time Calculation (min)  37 min    Activity Tolerance  Patient tolerated treatment well    Behavior During Therapy  [facility] for tasks assessed/performed       Past Medical History:  Diagnosis Date  . ADD (attention deficit disorder)   . Allergy   . Anemia   . Anxiety   . Hyperlipidemia   . Ileitis   . Lupus [facility])     Past Surgical History:  Procedure Laterality Date  . CESAREAN SECTION     x 2  . DENTAL SURGERY    . EYE SURGERY Bilateral    cryoretinopexy    There were no vitals filed for this visit.  Subjective Assessment - 02/28/18 0857    Subjective  I went for a very short and little run on Monday and had a little soreness but felt okay the next day.  I was able to walk on the beach and didn't have any problems    Pertinent History  lupus, c-section x 2     How long can you stand comfortably?  20-30 min    Patient Stated Goals  be able to start running again    Currently in Pain?  Yes    Pain Score  2     Pain Location  Foot    Pain Orientation  Right    Pain Radiating Towards  heel                       [facility] Adult PT Treatment/Exercise - 02/28/18 0001      Manual Therapy   Manual Therapy  Soft tissue mobilization    Soft tissue mobilization  bilateral gastroc and soleus, peroneals, ant tib, quad plantea, abductor hallicus       Trigger Point Dry Needling - 02/28/18 0931    Consent Given?  Yes    Muscles  Treated Lower Body  Tibialis anterior;Quadratus plantae;Gastrocnemius;Soleus peroneals    Gastrocnemius Response  Twitch response elicited;Palpable increased muscle length    Soleus Response  Twitch response elicited;Palpable increased muscle length    Tibialis Anterior Response  Twitch response elicited;Palpable increased muscle length    Quadatus Plantae Response  Twitch response elicited;Palpable increased muscle length                PT Long Term Goals - 02/16/18 1231      PT LONG TERM GOAL #1   Title  ind with advanced HEP    Time  6    Period  Weeks    Status  New    Target Date  03/30/18      PT LONG TERM GOAL #2   Title  FOTO < or = to 29% limited    Time  6    Period  Weeks    Status  New    Target Date  03/30/18  PT LONG TERM GOAL #3   Title  able to return to running 10 minutes 3 days/week without increased pain    Time  6    Period  Weeks    Status  New    Target Date  03/30/18      PT LONG TERM GOAL #4   Title  reports 50% reduction of pain in the morning and at the end of the day due to decreased muscle spasms    Time  6    Period  Weeks    Status  New    Target Date  03/30/18      PT LONG TERM GOAL #5   Title  able to maintain arch with single leg stand without excessive toe flexion    Time  6    Period  Weeks    Status  New    Target Date  03/30/18            Plan - 02/28/18 1044    Clinical Impression Statement  Pt had good response from initial dry needling treatment and again with twitching and increased muscle length during today's treatment.  Pt has had overall less pain since initial treatment.  She will benefit from skilled PT to address soft tissue spasms and work on addressing weakness.    Rehab Potential  Good    Clinical Impairments Affecting Rehab Potential  potential autoimmune disease flare ups    PT Treatment/Interventions  ADLs/Self Care Home Management;Cryotherapy;Electrical Stimulation;Iontophoresis 60m/ml  Dexamethasone;Moist Heat;Therapeutic activities;Therapeutic exercise;Neuromuscular re-education;Patient/family education;Manual techniques;Passive range of motion;Dry needling;Taping    PT Next Visit Plan  f/u on dry needle to gastroc/soleus and add quadrdatus plantea, flexor digi brev, posterior tib, anterior tib, peroneals, strengthening toe extension in single leg and squats, check gait on TM if pain subsided, core strengthening    Recommended Other Services  initial order signed; eval on 7/20    Consulted and Agree with Plan of Care  Patient       Patient will benefit from skilled therapeutic intervention in order to improve the following deficits and impairments:  Pain, Decreased strength, Increased muscle spasms, Hypermobility  Visit Diagnosis: Pain in right foot  Cramp and spasm  Muscle weakness (generalized)     Problem List Patient Active Problem List   Diagnosis Date Noted  . History of hyperlipidemia 11/08/2016  . History of anxiety 11/08/2016  . History of retinal tear 11/08/2016  . History of Bilateral plantar fasciitis 08/10/2016  . Plantar fasciitis 07/04/2016  . Erythema nodosum 07/04/2016  . High risk medication use 07/04/2016  . Lupus ([place] 05/23/2016  . 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 ADHD (attention deficit hyperactivity disorder) 12/22/2014  . Raynaud's phenomenon 10/17/2013  . Hyperlipidemia 09/28/2011    JZannie Cove PT 02/28/2018, 10:48 AM  [facility] [address] [place] [address]Phone: [phone]  Fax:  [phone] Name: Robin ANGELLMRN: [id number] of Birth: 707/05/1971

## 2018-03-05 ENCOUNTER — Ambulatory Visit: Payer: BLUE CROSS/BLUE SHIELD | Admitting: Physical Therapy

## 2018-03-05 ENCOUNTER — Encounter: Payer: Self-pay | Admitting: Physical Therapy

## 2018-03-05 DIAGNOSIS — M6281 Muscle weakness (generalized): Secondary | ICD-10-CM

## 2018-03-05 DIAGNOSIS — M79671 Pain in right foot: Secondary | ICD-10-CM

## 2018-03-05 DIAGNOSIS — F411 Generalized anxiety disorder: Secondary | ICD-10-CM | POA: Diagnosis not present

## 2018-03-05 DIAGNOSIS — R252 Cramp and spasm: Secondary | ICD-10-CM | POA: Diagnosis not present

## 2018-03-05 NOTE — Therapy (Signed)
[facility] [address], [place], [place], [address] Phone: [phone]   Fax:  [phone]  Physical Therapy Treatment  Patient Details  Name: Robin [name] MRN: [id number] Date of Birth: Jan 01, 1971 Referring Provider: Ofilia Neas, PA-C   Encounter Date: 03/05/2018  PT End of Session - 03/05/18 0928    Visit Number  3    Date for PT Re-Evaluation  03/30/18    Authorization Type  BCBS $45 copay    PT Start Time  0904 late    PT Stop Time  0929    PT Time Calculation (min)  25 min    Activity Tolerance  Patient tolerated treatment well    Behavior During Therapy  [facility] for tasks assessed/performed       Past Medical History:  Diagnosis Date  . ADD (attention deficit disorder)   . Allergy   . Anemia   . Anxiety   . Hyperlipidemia   . Ileitis   . Lupus [facility])     Past Surgical History:  Procedure Laterality Date  . CESAREAN SECTION     x 2  . DENTAL SURGERY    . EYE SURGERY Bilateral    cryoretinopexy    There were no vitals filed for this visit.  Subjective Assessment - 03/05/18 0903    Subjective  I went for a long hike yesterday and I felt good this morning.  It feels like it keeps getting better.    Currently in Pain?  Yes    Pain Score  1     Pain Orientation  Right                       [facility] Adult PT Treatment/Exercise - 03/05/18 0001      Neuro Re-ed    Neuro Re-ed Details   TrA activation and hip alignment during exercises      Exercises   Exercises  Knee/Hip      Knee/Hip Exercises: Standing   SLS  forward reach with slide board - 10x each side cue to prevent medial collapse; toe ext    Other Standing Knee Exercises  green band pallof squats - 20x      Manual Therapy   Soft tissue mobilization  bilateal gastroc and soleus       Trigger Point Dry Needling - 03/05/18 0934    Consent Given?  Yes    Gastrocnemius Response  Twitch response elicited;Palpable  increased muscle length                PT Long Term Goals - 02/16/18 1231      PT LONG TERM GOAL #1   Title  ind with advanced HEP    Time  6    Period  Weeks    Status  New    Target Date  03/30/18      PT LONG TERM GOAL #2   Title  FOTO < or = to 29% limited    Time  6    Period  Weeks    Status  New    Target Date  03/30/18      PT LONG TERM GOAL #3   Title  able to return to running 10 minutes 3 days/week without increased pain    Time  6    Period  Weeks    Status  New    Target Date  03/30/18      PT LONG  TERM GOAL #4   Title  reports 50% reduction of pain in the morning and at the end of the day due to decreased muscle spasms    Time  6    Period  Weeks    Status  New    Target Date  03/30/18      PT LONG TERM GOAL #5   Title  able to maintain arch with single leg stand without excessive toe flexion    Time  6    Period  Weeks    Status  New    Target Date  03/30/18            Plan - 03/05/18 0936    Clinical Impression Statement  Pt arrived late.  She needed cues for hip and knee alignment especially right LE had more collapse with single leg exercises.  Pt had trigger points on medial gastroc that were diminished with manual techniques.  She continues to benefit from skilled PT.    PT Treatment/Interventions  ADLs/Self Care Home Management;Cryotherapy;Electrical Stimulation;Iontophoresis 32m/ml Dexamethasone;Moist Heat;Therapeutic activities;Therapeutic exercise;Neuromuscular re-education;Patient/family education;Manual techniques;Passive range of motion;Dry needling;Taping    PT Next Visit Plan  f/u on dry needle to gastroc/soleus and add quadrdatus plantea, flexor digi brev, posterior tib, anterior tib, peroneals, strengthening toe extension in single leg and squats, check gait on TM if pain subsided, core strengthening    Consulted and Agree with Plan of Care  Patient       Patient will benefit from skilled therapeutic intervention in order  to improve the following deficits and impairments:  Pain, Decreased strength, Increased muscle spasms, Hypermobility  Visit Diagnosis: Pain in right foot  Cramp and spasm  Muscle weakness (generalized)     Problem List Patient Active Problem List   Diagnosis Date Noted  . History of hyperlipidemia 11/08/2016  . History of anxiety 11/08/2016  . History of retinal tear 11/08/2016  . History of Bilateral plantar fasciitis 08/10/2016  . Plantar fasciitis 07/04/2016  . Erythema nodosum 07/04/2016  . High risk medication use 07/04/2016  . Lupus ([place] 05/23/2016  . 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 ADHD (attention deficit hyperactivity disorder) 12/22/2014  . Raynaud's phenomenon 10/17/2013  . Hyperlipidemia 09/28/2011    JWayne Both6/24/2019, 9:37 AM  [facility] [address] [place] [address]Phone: [phone]  Fax:  [phone] Name: Robin VOHSMRN: [id number] of Birth: [birth year]

## 2018-03-14 DIAGNOSIS — F411 Generalized anxiety disorder: Secondary | ICD-10-CM | POA: Diagnosis not present

## 2018-03-16 ENCOUNTER — Encounter: Payer: Self-pay | Admitting: Physical Therapy

## 2018-03-16 ENCOUNTER — Ambulatory Visit: Payer: BLUE CROSS/BLUE SHIELD | Attending: Physician Assistant | Admitting: Physical Therapy

## 2018-03-16 DIAGNOSIS — M79671 Pain in right foot: Secondary | ICD-10-CM | POA: Diagnosis not present

## 2018-03-16 DIAGNOSIS — G8929 Other chronic pain: Secondary | ICD-10-CM | POA: Diagnosis not present

## 2018-03-16 DIAGNOSIS — M6281 Muscle weakness (generalized): Secondary | ICD-10-CM | POA: Diagnosis not present

## 2018-03-16 DIAGNOSIS — M25511 Pain in right shoulder: Secondary | ICD-10-CM | POA: Diagnosis not present

## 2018-03-16 DIAGNOSIS — R252 Cramp and spasm: Secondary | ICD-10-CM

## 2018-03-16 DIAGNOSIS — M542 Cervicalgia: Secondary | ICD-10-CM | POA: Diagnosis not present

## 2018-03-16 NOTE — Therapy (Signed)
[facility] [address], [place], [place], [address] Phone: [phone]   Fax:  [phone]  Physical Therapy Treatment  Patient Details  Name: Robin [name] MRN: [id number] Date of Birth: 02-Jan-1971 Referring Provider: Ofilia Neas, PA-C   Encounter Date: 03/16/2018  PT End of Session - 03/16/18 0839    Visit Number  4    Date for PT Re-Evaluation  03/30/18    Authorization Type  BCBS $45 copay    PT Start Time  0837    PT Stop Time  0917    PT Time Calculation (min)  40 min    Activity Tolerance  Patient tolerated treatment well    Behavior During Therapy  [facility] for tasks assessed/performed       Past Medical History:  Diagnosis Date  . ADD (attention deficit disorder)   . Allergy   . Anemia   . Anxiety   . Hyperlipidemia   . Ileitis   . Lupus [facility])     Past Surgical History:  Procedure Laterality Date  . CESAREAN SECTION     x 2  . DENTAL SURGERY    . EYE SURGERY Bilateral    cryoretinopexy    There were no vitals filed for this visit.  Subjective Assessment - 03/16/18 0843    Subjective  I haven't been running a lot lately but have been going for a lot of walks in hilly places.  The foot feels just a     Currently in Pain?  No/denies                       [facility] Adult PT Treatment/Exercise - 03/16/18 0001      Knee/Hip Exercises: Aerobic   Stationary Bike  L4 x 6 min      Knee/Hip Exercises: Standing   SLS  forward reach with slide board - 10x each side cue to prevent medial collapse; toe ext    SLS with Vectors  BOSU and half foam roll - balance and vectors 3 ways - 10x each      Manual Therapy   Manual Therapy  Soft tissue mobilization;Taping    Soft tissue mobilization  bilateal gastroc and soleus    Kinesiotex  Create Space;Facilitate Muscle      Kinesiotix   Create Space  taping to plantarfascia for space and arch support - bilateral       Trigger Point Dry  Needling - 03/16/18 0928    Consent Given?  Yes    Gastrocnemius Response  Twitch response elicited;Palpable increased muscle length    Soleus Response  Twitch response elicited;Palpable increased muscle length                PT Long Term Goals - 03/16/18 0920      PT LONG TERM GOAL #1   Title  ind with advanced HEP    Time  6    Period  Weeks    Status  On-going      PT LONG TERM GOAL #2   Title  FOTO < or = to 29% limited    Time  6    Period  Weeks    Status  On-going      PT LONG TERM GOAL #3   Title  able to return to running 10 minutes 3 days/week without increased pain    Baseline  will begin adding more running this week    Time  6    Period  Weeks    Status  On-going      PT LONG TERM GOAL #4   Title  reports 50% reduction of pain in the morning and at the end of the day due to decreased muscle spasms    Baseline  no pain only stiff    Time  6    Period  Weeks    Status  On-going      PT LONG TERM GOAL #5   Title  able to maintain arch with single leg stand without excessive toe flexion    Time  6    Period  Weeks    Status  On-going            Plan - 03/16/18 7412    Clinical Impression Statement  Pt is doing well with plantarfascia pain mostly resolved reports 80% improvement overall.  Pt needs cues during exercises for hip and knee alignment.  Pt has decreased trigger points in calves and good response to taping after    PT Treatment/Interventions  ADLs/Self Care Home Management;Cryotherapy;Electrical Stimulation;Iontophoresis 79m/ml Dexamethasone;Moist Heat;Therapeutic activities;Therapeutic exercise;Neuromuscular re-education;Patient/family education;Manual techniques;Passive range of motion;Dry needling;Taping    PT Next Visit Plan  f/u on dry needle to gastroc/soleus and taping of plantar fascia, strengthening toe flexion/extension in single leg and squats    Consulted and Agree with Plan of Care  Patient       Patient will benefit from  skilled therapeutic intervention in order to improve the following deficits and impairments:  Pain, Decreased strength, Increased muscle spasms, Hypermobility  Visit Diagnosis: Pain in right foot  Cramp and spasm  Muscle weakness (generalized)     Problem List Patient Active Problem List   Diagnosis Date Noted  . History of hyperlipidemia 11/08/2016  . History of anxiety 11/08/2016  . History of retinal tear 11/08/2016  . History of Bilateral plantar fasciitis 08/10/2016  . Plantar fasciitis 07/04/2016  . Erythema nodosum 07/04/2016  . High risk medication use 07/04/2016  . Lupus ([place] 05/23/2016  . 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 ADHD (attention deficit hyperactivity disorder) 12/22/2014  . Raynaud's phenomenon 10/17/2013  . Hyperlipidemia 09/28/2011    JZannie Cove PT 03/16/2018, 9:31 AM  [facility] [address] [place] [address]Phone: [phone]  Fax:  [phone] Name: Robin BOLLERMRN: [id number] of Birth: 712-25-1972

## 2018-03-19 DIAGNOSIS — F4010 Social phobia, unspecified: Secondary | ICD-10-CM | POA: Diagnosis not present

## 2018-03-19 DIAGNOSIS — F410 Panic disorder [episodic paroxysmal anxiety] without agoraphobia: Secondary | ICD-10-CM | POA: Diagnosis not present

## 2018-03-19 DIAGNOSIS — F411 Generalized anxiety disorder: Secondary | ICD-10-CM | POA: Diagnosis not present

## 2018-03-19 DIAGNOSIS — F3340 Major depressive disorder, recurrent, in remission, unspecified: Secondary | ICD-10-CM | POA: Diagnosis not present

## 2018-03-22 ENCOUNTER — Ambulatory Visit: Payer: BLUE CROSS/BLUE SHIELD | Admitting: Physical Therapy

## 2018-03-28 DIAGNOSIS — F411 Generalized anxiety disorder: Secondary | ICD-10-CM | POA: Diagnosis not present

## 2018-03-29 ENCOUNTER — Ambulatory Visit: Payer: BLUE CROSS/BLUE SHIELD | Admitting: Physical Therapy

## 2018-03-29 ENCOUNTER — Encounter: Payer: Self-pay | Admitting: Physical Therapy

## 2018-03-29 DIAGNOSIS — M79671 Pain in right foot: Secondary | ICD-10-CM | POA: Diagnosis not present

## 2018-03-29 DIAGNOSIS — G8929 Other chronic pain: Secondary | ICD-10-CM | POA: Diagnosis not present

## 2018-03-29 DIAGNOSIS — R252 Cramp and spasm: Secondary | ICD-10-CM

## 2018-03-29 DIAGNOSIS — M6281 Muscle weakness (generalized): Secondary | ICD-10-CM | POA: Diagnosis not present

## 2018-03-29 DIAGNOSIS — M25511 Pain in right shoulder: Secondary | ICD-10-CM | POA: Diagnosis not present

## 2018-03-29 DIAGNOSIS — M542 Cervicalgia: Secondary | ICD-10-CM | POA: Diagnosis not present

## 2018-03-29 NOTE — Therapy (Signed)
[facility] [address], [place], [place], [address] Phone: [phone]   Fax:  [phone]  Physical Therapy Treatment  Patient Details  Name: Robin [name] MRN: [id number] Date of Birth: March 17, 1971 Referring Provider: Ofilia Neas, PA-C   Encounter Date: 03/29/2018  PT End of Session - 03/29/18 0936    Visit Number  5    Date for PT Re-Evaluation  03/30/18    Authorization Type  BCBS $45 copay    PT Start Time  0932    PT Stop Time  1015    PT Time Calculation (min)  43 min    Activity Tolerance  Patient tolerated treatment well    Behavior During Therapy  [facility] for tasks assessed/performed       Past Medical History:  Diagnosis Date  . ADD (attention deficit disorder)   . Allergy   . Anemia   . Anxiety   . Hyperlipidemia   . Ileitis   . Lupus [facility])     Past Surgical History:  Procedure Laterality Date  . CESAREAN SECTION     x 2  . DENTAL SURGERY    . EYE SURGERY Bilateral    cryoretinopexy    There were no vitals filed for this visit.  Subjective Assessment - 03/29/18 0934    Subjective  My calves have been a little stiff more than my heels when adding 20 min of running to hour long walks.  Did 3/ week.      Patient Stated Goals  be able to start running again    Currently in Pain?  No/denies                       [facility] Adult PT Treatment/Exercise - 03/29/18 0001      Manual Therapy   Soft tissue mobilization  bilateal gastroc and soleus, anterior and posterior tib       Trigger Point Dry Needling - 03/29/18 1111    Gastrocnemius Response  Twitch response elicited;Palpable increased muscle length    Soleus Response  Twitch response elicited;Palpable increased muscle length    Tibialis Anterior Response  Twitch response elicited;Palpable increased muscle length                PT Long Term Goals - 03/29/18 1025      PT LONG TERM GOAL #1   Title  ind with  advanced HEP    Status  Achieved      PT LONG TERM GOAL #2   Title  FOTO < or = to 29% limited    Baseline  23% limited    Status  Achieved      PT LONG TERM GOAL #3   Title  able to return to running 10 minutes 3 days/week without increased pain    Baseline  added 3 15-20 min runs    Status  Achieved      PT LONG TERM GOAL #4   Title  reports 50% reduction of pain in the morning and at the end of the day due to decreased muscle spasms    Baseline  at least 75%    Status  Achieved      PT LONG TERM GOAL #5   Title  able to maintain arch with single leg stand without excessive toe flexion    Baseline  able to demonstrate    Status  Achieved  Plan - 03/29/18 0937    Clinical Impression Statement  Pt reports at least 75% less pain.  She is independent with her HEP and has been able to return to running.  Pt will discharge with HEP today    PT Treatment/Interventions  ADLs/Self Care Home Management;Cryotherapy;Electrical Stimulation;Iontophoresis 34m/ml Dexamethasone;Moist Heat;Therapeutic activities;Therapeutic exercise;Neuromuscular re-education;Patient/family education;Manual techniques;Passive range of motion;Dry needling;Taping    PT Next Visit Plan  discharged today    Consulted and Agree with Plan of Care  Patient       Patient will benefit from skilled therapeutic intervention in order to improve the following deficits and impairments:  Pain, Decreased strength, Increased muscle spasms, Hypermobility  Visit Diagnosis: Pain in right foot  Cramp and spasm  Muscle weakness (generalized)  Chronic right shoulder pain  Cervicalgia     Problem List Patient Active Problem List   Diagnosis Date Noted  . History of hyperlipidemia 11/08/2016  . History of anxiety 11/08/2016  . History of retinal tear 11/08/2016  . History of Bilateral plantar fasciitis 08/10/2016  . Plantar fasciitis 07/04/2016  . Erythema nodosum 07/04/2016  . High risk medication  use 07/04/2016  . Lupus ([place] 05/23/2016  . 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 ADHD (attention deficit hyperactivity disorder) 12/22/2014  . Raynaud's phenomenon 10/17/2013  . Hyperlipidemia 09/28/2011    JZannie Cove PT 03/29/2018, 11:12 AM  [facility] [address] [place] [address]Phone: [phone]  Fax:  [phone] Name: Robin SIRMONMRN: [id number] of Birth: 7Sep 22, 1972 PHYSICAL THERAPY DISCHARGE SUMMARY  Visits from Start of Care: 5 Current functional level related to goals / functional outcomes: See above goals met   Remaining deficits: See above   Education / Equipment: HEP  Plan: Patient agrees to discharge.  Patient goals were met. Patient is being discharged due to meeting the stated rehab goals.  ?????     JGoogle PT 03/29/18 11:31 AM

## 2018-04-11 DIAGNOSIS — F411 Generalized anxiety disorder: Secondary | ICD-10-CM | POA: Diagnosis not present

## 2018-05-02 DIAGNOSIS — F411 Generalized anxiety disorder: Secondary | ICD-10-CM | POA: Diagnosis not present

## 2018-05-10 DIAGNOSIS — F411 Generalized anxiety disorder: Secondary | ICD-10-CM | POA: Diagnosis not present

## 2018-05-11 ENCOUNTER — Other Ambulatory Visit: Payer: Self-pay | Admitting: Physician Assistant

## 2018-05-11 NOTE — Telephone Encounter (Signed)
Last visit: 02/06/18 Next Visit: 08/13/18 Labs: 02/06/18 CBC WNL . Alk phos stable PLQ eye exam on 07/06/16   Okay to refill per Dr. Deveshwar 

## 2018-05-31 DIAGNOSIS — F411 Generalized anxiety disorder: Secondary | ICD-10-CM | POA: Diagnosis not present

## 2018-06-07 DIAGNOSIS — F411 Generalized anxiety disorder: Secondary | ICD-10-CM | POA: Diagnosis not present

## 2018-06-08 ENCOUNTER — Other Ambulatory Visit: Payer: Self-pay | Admitting: Rheumatology

## 2018-06-08 NOTE — Telephone Encounter (Signed)
Last visit: 02/06/18 Next Visit: 08/13/18 Labs: 02/06/18 CBC WNL . Alk phos stable PLQ eye exam 07/06/16   Left message to advise patient we need update plq eye exam  Okay to refill per Dr. Deveshwar 

## 2018-06-14 DIAGNOSIS — F411 Generalized anxiety disorder: Secondary | ICD-10-CM | POA: Diagnosis not present

## 2018-06-21 DIAGNOSIS — F411 Generalized anxiety disorder: Secondary | ICD-10-CM | POA: Diagnosis not present

## 2018-06-25 ENCOUNTER — Telehealth: Payer: Self-pay | Admitting: Rheumatology

## 2018-06-25 NOTE — Telephone Encounter (Signed)
Patient called stating she had her Plaquenil Eye Exam on 02/13/18 at [facility].  Patient states she will call and have them fax a copy of the results to our office.

## 2018-06-25 NOTE — Telephone Encounter (Signed)
Results received and documented.

## 2018-06-28 DIAGNOSIS — F410 Panic disorder [episodic paroxysmal anxiety] without agoraphobia: Secondary | ICD-10-CM | POA: Diagnosis not present

## 2018-06-28 DIAGNOSIS — F3340 Major depressive disorder, recurrent, in remission, unspecified: Secondary | ICD-10-CM | POA: Diagnosis not present

## 2018-06-28 DIAGNOSIS — F4010 Social phobia, unspecified: Secondary | ICD-10-CM | POA: Diagnosis not present

## 2018-06-28 DIAGNOSIS — F411 Generalized anxiety disorder: Secondary | ICD-10-CM | POA: Diagnosis not present

## 2018-07-11 ENCOUNTER — Other Ambulatory Visit: Payer: Self-pay | Admitting: Rheumatology

## 2018-07-12 DIAGNOSIS — F411 Generalized anxiety disorder: Secondary | ICD-10-CM | POA: Diagnosis not present

## 2018-07-12 NOTE — Telephone Encounter (Signed)
Last visit: 02/06/18 Next Visit: 08/13/18 Labs: 5/28/19CBC WNL . Alk phos stable PLQ Eye Exam: 02/16/18 WNL   Okay to refill per Dr. Estanislado Pandy

## 2018-07-30 ENCOUNTER — Encounter: Payer: Self-pay | Admitting: Emergency Medicine

## 2018-07-30 ENCOUNTER — Ambulatory Visit: Payer: BC Managed Care – PPO | Admitting: Emergency Medicine

## 2018-07-30 ENCOUNTER — Other Ambulatory Visit: Payer: Self-pay

## 2018-07-30 VITALS — BP 106/66 | HR 72 | Temp 98.8°F

## 2018-07-30 DIAGNOSIS — M329 Systemic lupus erythematosus, unspecified: Secondary | ICD-10-CM | POA: Diagnosis not present

## 2018-07-30 DIAGNOSIS — N939 Abnormal uterine and vaginal bleeding, unspecified: Secondary | ICD-10-CM | POA: Diagnosis not present

## 2018-07-30 DIAGNOSIS — N920 Excessive and frequent menstruation with regular cycle: Secondary | ICD-10-CM | POA: Diagnosis not present

## 2018-07-30 DIAGNOSIS — Z23 Encounter for immunization: Secondary | ICD-10-CM | POA: Diagnosis not present

## 2018-07-30 LAB — POCT CBC
Granulocyte percent: 57.6 %G (ref 37–80)
HCT, POC: 38.8 % (ref 29–41)
Hemoglobin: 12.8 g/dL (ref 9.5–13.5)
Lymph, poc: 1.5 (ref 0.6–3.4)
MCH, POC: 31.1 pg (ref 27–31.2)
MCHC: 33 g/dL (ref 31.8–35.4)
MCV: 94.2 fL (ref 76–111)
MID (cbc): 0.3 (ref 0–0.9)
MPV: 7.3 fL (ref 0–99.8)
POC Granulocyte: 2.5 (ref 2–6.9)
POC LYMPH PERCENT: 35.1 %L (ref 10–50)
POC MID %: 7.3 %M (ref 0–12)
Platelet Count, POC: 262 10*3/uL (ref 142–424)
RBC: 4.11 M/uL (ref 4.04–5.48)
RDW, POC: 13.3 %
WBC: 4.4 10*3/uL — AB (ref 4.6–10.2)

## 2018-07-30 LAB — POCT URINE PREGNANCY: Preg Test, Ur: NEGATIVE

## 2018-07-30 NOTE — Patient Instructions (Addendum)
     If you have lab work done today you will be contacted with your lab results within the next 2 weeks.  If you have not heard from [place] then please contact us. The fastest way to get your results is to register for My Chart.   IF you received an x-ray today, you will receive an invoice from [facility]. Please contact [facility] at [phone] with questions or concerns regarding your invoice.   IF you received labwork today, you will receive an invoice from [facility]. Please contact [facility] at [phone] with questions or concerns regarding your invoice.   Our billing staff will not be able to assist you with questions regarding bills from these companies.  You will be contacted with the lab results as soon as they are available. The fastest way to get your results is to activate your My Chart account. Instructions are located on the last page of this paperwork. If you have not heard from [place] regarding the results in 2 weeks, please contact this office.     Menorrhagia Menorrhagia is when your menstrual periods are heavy or last longer than usual. Follow these instructions at home:  Only take medicine as told by your doctor.  Take any iron pills as told by your doctor. Heavy bleeding may cause low levels of iron in your body.  Do not take aspirin 1 week before or during your period. Aspirin can make the bleeding worse.  Lie down for a while if you change your tampon or pad more than once in 2 hours. This may help lessen the bleeding.  Eat a healthy diet and foods with iron. These foods include leafy green vegetables, meat, liver, eggs, and whole grain breads and cereals.  Do not try to lose weight. Wait until the heavy bleeding has stopped and your iron level is normal. Contact a doctor if:  You soak through a pad or tampon every 1 or 2 hours, and this happens every time you have a period.  You need to use pads and tampons at the same time because you are  bleeding so much.  You need to change your pad or tampon during the night.  You have a period that lasts for more than 8 days.  You pass clots bigger than 1 inch (2.5 cm) wide.  You have irregular periods that happen more or less often than once a month.  You feel dizzy or pass out (faint).  You feel very weak or tired.  You feel short of breath or feel your heart is beating too fast when you exercise.  You feel sick to your stomach (nausea) and you throw up (vomit) while you are taking your medicine.  You have watery poop (diarrhea) while you are taking your medicine.  You have any problems that may be related to the medicine you are taking. Get help right away if:  You soak through 4 or more pads or tampons in 2 hours.  You have any bleeding while you are pregnant. This information is not intended to replace advice given to you by your health care provider. Make sure you discuss any questions you have with your health care provider. Document Released: 06/07/2008 Document Revised: 02/04/2016 Document Reviewed: 02/28/2013 Elsevier Interactive Patient Education  2017 [employer].

## 2018-07-30 NOTE — Progress Notes (Deleted)
Office Visit Note  Patient: Robin [name]             Date of Birth: November 07, 1970           MRN: [id number]             PCP: Forrest Moron, MD Referring: Wardell Honour, MD Visit Date: 08/13/2018 Occupation: @GUAROCC @  Subjective:  No chief complaint on file.   History of Present Illness: Robin [name] is a 47 y.o. female ***   Activities of Daily Living:  Patient reports morning stiffness for *** {minute/hour:19697}.   Patient {ACTIONS;DENIES/REPORTS:21021675::"Denies"} nocturnal pain.  Difficulty dressing/grooming: {ACTIONS;DENIES/REPORTS:21021675::"Denies"} Difficulty climbing stairs: {ACTIONS;DENIES/REPORTS:21021675::"Denies"} Difficulty getting out of chair: {ACTIONS;DENIES/REPORTS:21021675::"Denies"} Difficulty using hands for taps, buttons, cutlery, and/or writing: {ACTIONS;DENIES/REPORTS:21021675::"Denies"}  No Rheumatology ROS completed.   PMFS History:  Patient Active Problem List   Diagnosis Date Noted  . History of hyperlipidemia 11/08/2016  . History of anxiety 11/08/2016  . History of retinal tear 11/08/2016  . History of Bilateral plantar fasciitis 08/10/2016  . High risk medication use 07/04/2016  . Lupus ([place]) 05/23/2016  . 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 ADHD (attention deficit hyperactivity disorder) 12/22/2014  . Raynaud's phenomenon 10/17/2013  . Hyperlipidemia 09/28/2011    Past Medical History:  Diagnosis Date  . ADD (attention deficit disorder)   . Allergy   . Anemia   . Anxiety   . Hyperlipidemia   . Ileitis   . Lupus ([place])     Family History  Problem Relation Age of Onset  . Heart disease Mother 24       CAD/stenting  . Hyperlipidemia Mother   . Arthritis Mother        ostearthritis  . Hypertension Mother   . Kidney disease Mother   . Irritable bowel syndrome Mother   . Stroke Mother 78       CVA  . Fibromyalgia Mother   . Hyperlipidemia Father   . Neuropathy Father   . Hyperlipidemia Sister   . Hypertension  Brother   . Colon cancer Neg Hx   . Inflammatory bowel disease Neg Hx    Past Surgical History:  Procedure Laterality Date  . CESAREAN SECTION     x 2  . DENTAL SURGERY    . EYE SURGERY Bilateral    cryoretinopexy   Social History   Social History Narrative   Marital status: married x 17 years; happily married      Children: 2 children (42, 52)      Lives: with husband, 2 children.      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 Go to the Rheumatology activity and complete the homunculus joint exam.  Investigation: No additional findings.  Imaging: No results found.  Recent Labs: Lab Results  Component Value Date   WBC 4.4 (A) 07/30/2018   HGB 12.8 07/30/2018   PLT 236 02/06/2018   NA 139 02/06/2018  K 4.0 02/06/2018   CL 104 02/06/2018   CO2 31 02/06/2018   GLUCOSE 73 02/06/2018   BUN 14 02/06/2018   CREATININE 0.80 02/06/2018   BILITOT 0.5 02/06/2018   ALKPHOS 27 (L) 04/19/2017   AST 13 02/06/2018   ALT 9 02/06/2018   PROT 6.5 02/06/2018   ALBUMIN 4.8 04/19/2017   CALCIUM 9.1 02/06/2018   GFRAA 104 02/06/2018    Speciality Comments: PLQ Eye Exam: 02/16/18 WNL Follow up in 1 year  Procedures:  No procedures performed Allergies: Avelox [moxifloxacin hcl in nacl]; Cefaclor; Latex; Red wine complex [germanium]; Meloxicam; and Naproxen   Assessment / Plan:     Visit Diagnoses: Other systemic lupus erythematosus with other organ involvement  (HCC)  High risk medication use - Plaquenil 200 mg twice daily Monday through Friday  Raynaud's phenomenon without gangrene  Erythema nodosum  Plantar fasciitis  Other fatigue  History of anxiety  History of retinal tear  History of hyperlipidemia  Attention deficit hyperactivity disorder (ADHD), predominantly inattentive type   Orders: No orders of the defined types were placed in this encounter.  No orders of the defined types were placed in this encounter.   Face-to-face time spent with patient was *** minutes. Greater than 50% of time was spent in counseling and coordination of care.  Follow-Up Instructions: No follow-ups on file.   Ofilia Neas, PA-C  Note - This record has been created using Dragon software.  Chart creation errors have been sought, but may not always  have been located. Such creation errors do not reflect on  the standard of medical care.

## 2018-07-30 NOTE — Progress Notes (Signed)
Orthostatic VS for the past 24 hrs:  BP- Lying Pulse- Lying BP- Standing at 0 minutes Pulse- Standing at 0 minutes  07/30/18 1301 113/75 73 118/84 80    Robin [name] 47 y.o.   Chief Complaint  Patient presents with  . Vaginal Bleeding    with heavy bleeding and clots per patient started Saturday night    HISTORY OF PRESENT ILLNESS: This is a 47 y.o. female with history of lupus complaining of excessive vaginal bleeding over the weekend.  Part of her menstrual cycle.  Not pregnant.  Has been passing large amounts of clots with intermittent pelvic cramping/pain.  Able to eat and drink.  Denies nausea or vomiting.  Feels a little better today.  No other significant symptoms.  Denies syncope, dizziness, or lightheadedness.  HPI   Prior to Admission medications   Medication Sig Start Date End Date Taking? Authorizing Provider  acetaminophen (TYLENOL) 500 MG tablet Take 1,000 mg by mouth every 6 (six) hours as needed for mild pain.   Yes [provider]  albuterol (PROVENTIL HFA;VENTOLIN HFA) 108 (90 Base) MCG/ACT inhaler Inhale 2 puffs into the lungs every 6 (six) hours as needed for wheezing or shortness of breath. 08/27/16  Yes Tereasa Coop, PA-C  amphetamine-dextroamphetamine (ADDERALL) 10 MG tablet Take 10 mg once a day to twice daily. 10/12/17  Yes Wardell Honour, MD  azelastine (ASTELIN) 0.1 % nasal spray PLACE 2 SPRAYS INTO BOTH NOSTRILS TWICE DAILY 10/06/17  Yes Wardell Honour, MD  Boric Acid Topical POWD Place vaginally. 09/08/15  Yes [provider]  buPROPion (WELLBUTRIN XL) 150 MG 24 hr tablet TK 1 T PO QAM FOR  7 DAYS  THEN TK 2 TS PO QAM 12/07/17  Yes [provider]  DiphenhydrAMINE HCl (BENADRYL ALLERGY PO) Take by mouth as needed.   Yes [provider]  hydroxychloroquine (PLAQUENIL) 200 MG tablet TAKE 1 TABLET BY MOUTH TWICE DAILY(Q 12 HOURS) ON MONDAY-FRIDAY 02/19/18  Yes Deveshwar, Abel Presto, MD  loratadine (CLARITIN) 10 MG tablet  Take 10 mg by mouth daily.   Yes [provider]  Omega 3-6-9 Fatty Acids (OMEGA-3-6-9 PO) Take 2 capsules by mouth daily.   Yes [provider]  Probiotic Product (PROBIOTIC COMPLEX ACIDOPHILUS PO) Take 1 capsule by mouth daily.    Yes [provider]  sodium chloride (OCEAN) 0.65 % nasal spray Place 1 spray into the nose daily as needed for congestion.    Yes [provider]  TURMERIC PO Take by mouth 4 (four) times daily.   Yes [provider]  VITAMIN D, CHOLECALCIFEROL, PO Take 500 mg by mouth.   Yes [provider]  buPROPion (WELLBUTRIN XL) 300 MG 24 hr tablet  02/04/18   [provider]  hydroxychloroquine (PLAQUENIL) 200 MG tablet TAKE 1 TABLET BY MOUTH TWICE DAILY ON [place] Patient not taking: Reported on 07/30/2018 07/12/18   Bo Merino, MD    Allergies  Allergen Reactions  . Avelox [Moxifloxacin Hcl In Nacl] Other (See Comments)    Muscle cramps  . Cefaclor Hives    Tolerates penicillins  . Latex Other (See Comments)    "Raw skin"  . Red Wine Complex [Germanium]     Itching   . Meloxicam Rash  . Naproxen Rash    Patient Active Problem List   Diagnosis Date Noted  . History of hyperlipidemia 11/08/2016  . History of anxiety 11/08/2016  . History of retinal tear 11/08/2016  .  History of Bilateral plantar fasciitis 08/10/2016  . Plantar fasciitis 07/04/2016  . Erythema nodosum 07/04/2016  . High risk medication use 07/04/2016  . Lupus ([place]) 05/23/2016  . 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 ADHD (attention deficit hyperactivity disorder) 12/22/2014  . Raynaud's phenomenon 10/17/2013  . Hyperlipidemia 09/28/2011    Past Medical History:  Diagnosis Date  . ADD (attention deficit disorder)   . Allergy   . Anemia   . Anxiety   . Hyperlipidemia   . Ileitis   . Lupus [facility])     Past Surgical History:  Procedure Laterality Date  . CESAREAN SECTION     x 2  . DENTAL SURGERY      . EYE SURGERY Bilateral    cryoretinopexy    Social History   Socioeconomic History  . Marital status: Married    Spouse name: Not on file  . Number of children: 2  . Years of education: 10  . Highest education level: Not on file  Occupational History  . Not on file  Social Needs  . Financial resource strain: Not on file  . Food insecurity:    Worry: Not on file    Inability: Not on file  . Transportation needs:    Medical: Not on file    Non-medical: Not on file  Tobacco Use  . Smoking status: Former Smoker    Packs/day: 0.75    Years: 10.00    Pack years: 7.50    Types: Cigarettes    Last attempt to quit: 07/05/2001    Years since quitting: 17.0  . Smokeless tobacco: Never Used  Substance and Sexual Activity  . Alcohol use: Yes    Alcohol/week: 14.0 standard drinks    Types: 14 Cans of beer per week    Comment: 2 beer daily  . Drug use: No  . Sexual activity: Yes    Birth control/protection: Condom  Lifestyle  . Physical activity:    Days per week: Not on file    Minutes per session: Not on file  . Stress: Not on file  Relationships  . Social connections:    Talks on phone: Not on file    Gets together: Not on file    Attends religious service: Not on file    Active member of club or organization: Not on file    Attends meetings of clubs or organizations: Not on file    Relationship status: Not on file  . Intimate partner violence:    Fear of current or ex partner: Not on file    Emotionally abused: Not on file    Physically abused: Not on file    Forced sexual activity: Not on file  Other Topics Concern  . Not on file  Social History Narrative   Marital status: married x 17 years; happily married      Children: 2 children (32, 64)      Lives: with husband, 2 children.      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 Heart disease Mother 82       CAD/stenting  . Hyperlipidemia Mother   . Arthritis Mother        ostearthritis  .  Hypertension Mother   . Kidney disease Mother   . Irritable bowel syndrome Mother   . Stroke Mother 4       CVA  . Fibromyalgia Mother   . Hyperlipidemia Father   . Neuropathy Father   . Hyperlipidemia Sister   . Hypertension Brother   . Colon cancer Neg Hx   . Inflammatory bowel disease Neg Hx      Review of Systems  Constitutional: Negative.  Negative for chills and fever.  HENT: Negative.  Negative for sore throat.   Eyes: Negative.  Negative for blurred vision and double vision.  Respiratory: Negative.  Negative for cough and shortness of breath.   Cardiovascular: Negative.  Negative for chest pain and palpitations.  Gastrointestinal: Negative for abdominal pain, diarrhea, nausea and vomiting.  Genitourinary:       Excessive vaginal bleeding and pelvic cramping  Skin: Negative.  Negative for rash.  Neurological: Negative.  Negative for dizziness and headaches.  Endo/Heme/Allergies: Negative.     Vitals:   07/30/18 1234  BP: 106/66  Pulse: 72  Temp: 98.8 F (37.1 C)   Orthostatic VS for the past 24 hrs:  BP- Lying Pulse- Lying BP- Standing at 0 minutes Pulse- Standing at 0 minutes  07/30/18 1301 113/75 73 118/84 80     Physical Exam  Constitutional: She is oriented to person, place, and time. She appears well-developed and well-nourished.  HENT:  Head: Normocephalic and atraumatic.  Nose: Nose normal.  Mouth/Throat: Oropharynx is clear and moist.  Eyes: Pupils are equal, round, and reactive to light.  Neck: Normal range of motion. Neck supple.  Cardiovascular: Normal rate and regular rhythm.  Pulmonary/Chest: Effort normal and breath sounds normal.  Abdominal: She exhibits no distension. There is no tenderness.  Musculoskeletal: Normal range of motion.   Neurological: She is alert and oriented to person, place, and time. No sensory deficit. She exhibits normal muscle tone.  Skin: Skin is warm and dry. Capillary refill takes less than 2 seconds.  Psychiatric: She has a normal mood and affect. Her behavior is normal.  Vitals reviewed.  Results for orders placed or performed in visit on 07/30/18 (from the past 24 hour(s))  POCT urine pregnancy     Status: None   Collection Time: 07/30/18 12:50 PM  Result Value Ref Range   Preg Test, Ur Negative Negative  POCT CBC     Status: Abnormal   Collection Time: 07/30/18  1:13 PM  Result Value Ref Range   WBC 4.4 (A) 4.6 - 10.2 K/uL   Lymph, poc 1.5 0.6 - 3.4   POC LYMPH PERCENT 35.1 10 - 50 %L   MID (cbc) 0.3 0 - 0.9   POC MID % 7.3 0 - 12 %M   POC Granulocyte 2.5 2 - 6.9   Granulocyte percent 57.6 37 - 80 %G   RBC 4.11 4.04 - 5.48 M/uL   Hemoglobin 12.8 9.5 - 13.5 g/dL   HCT, POC 38.8 29 - 41 %   MCV 94.2 76 - 111 fL   MCH, POC 31.1 27 - 31.2 pg   MCHC 33.0 31.8 - 35.4 g/dL   RDW, POC 13.3 %   Platelet Count, POC 262 142 - 424 K/uL   MPV 7.3 0 - 99.8 fL     ASSESSMENT & PLAN:  Robin [name] was seen today for vaginal bleeding.  Diagnoses and all orders for this visit:  Vaginal bleeding -     POCT urine pregnancy -  Orthostatic vital signs -     POCT CBC  Menorrhagia with regular cycle -     Orthostatic vital signs -     POCT CBC  Need for influenza vaccination -     Flu Vaccine QUAD 36+ mos IM  SLE (systemic lupus erythematosus related syndrome) ([place])    Patient Instructions       If you have lab work done today you will be contacted with your lab results within the next 2 weeks.  If you have not heard from [place] then please contact us. The fastest way to get your results is to register for My Chart.   IF you received an x-ray today, you will receive an invoice from [facility]. Please contact [facility] at [phone] with questions or concerns  regarding your invoice.   IF you received labwork today, you will receive an invoice from [facility]. Please contact [facility] at [phone] with questions or concerns regarding your invoice.   Our billing staff will not be able to assist you with questions regarding bills from these companies.  You will be contacted with the lab results as soon as they are available. The fastest way to get your results is to activate your My Chart account. Instructions are located on the last page of this paperwork. If you have not heard from [place] regarding the results in 2 weeks, please contact this office.     Menorrhagia Menorrhagia is when your menstrual periods are heavy or last longer than usual. Follow these instructions at home:  Only take medicine as told by your doctor.  Take any iron pills as told by your doctor. Heavy bleeding may cause low levels of iron in your body.  Do not take aspirin 1 week before or during your period. Aspirin can make the bleeding worse.  Lie down for a while if you change your tampon or pad more than once in 2 hours. This may help lessen the bleeding.  Eat a healthy diet and foods with iron. These foods include leafy green vegetables, meat, liver, eggs, and whole grain breads and cereals.  Do not try to lose weight. Wait until the heavy bleeding has stopped and your iron level is normal. Contact a doctor if:  You soak through a pad or tampon every 1 or 2 hours, and this happens every time you have a period.  You need to use pads and tampons at the same time because you are bleeding so much.  You need to change your pad or tampon during the night.  You have a period that lasts for more than 8 days.  You pass clots bigger than 1 inch (2.5 cm) wide.  You have irregular periods that happen more or less often than once a month.  You feel dizzy or pass out (faint).  You feel very weak or tired.  You feel short of breath or feel your heart is beating too fast  when you exercise.  You feel sick to your stomach (nausea) and you throw up (vomit) while you are taking your medicine.  You have watery poop (diarrhea) while you are taking your medicine.  You have any problems that may be related to the medicine you are taking. Get help right away if:  You soak through 4 or more pads or tampons in 2 hours.  You have any bleeding while you are pregnant. This information is not intended to replace advice given to you by your health care provider. Make sure you  discuss any questions you have with your health care provider. Document Released: 06/07/2008 Document Revised: 02/04/2016 Document Reviewed: 02/28/2013 Elsevier Interactive Patient Education  2017 Elsevier Inc.     Agustina Caroli, MD [facility]

## 2018-08-12 ENCOUNTER — Other Ambulatory Visit: Payer: Self-pay | Admitting: Rheumatology

## 2018-08-13 ENCOUNTER — Ambulatory Visit: Payer: BLUE CROSS/BLUE SHIELD | Admitting: Physician Assistant

## 2018-08-13 NOTE — Telephone Encounter (Signed)
Last visit: 02/06/18 Next Visit: 08/31/18 Labs: 5/28/19CBC WNL . Alk phos stable PLQ Eye Exam: 02/16/18 WNL   Okay to refill 30 day supply per Dr. Estanislado Pandy

## 2018-08-17 NOTE — Progress Notes (Signed)
Office Visit Note  Patient: Robin [name]             Date of Birth: January 03, 1971           MRN: [id number]             PCP: Forrest Moron, MD Referring: Wardell Honour, MD Visit Date: 08/31/2018 Occupation: @GUAROCC @  Subjective:  Raynaud's    History of Present Illness: Robin [name] is a 47 y.o. female with history of systemic lupus erythematosus, Raynaud's, and plantar fasciitis.  She is taking Plaquenil 200 mg BID M-F.  She reports that she is tolerating it well and has not missed any doses recently.  She states that her energy level has been improving recently.  She reports that she has been trying to exercise on a regular basis.  She has been walking most days and has tried to get back to running as well as working on balance and toning exercises.  She states that over the past 4 months she has sprained her left ankle 2-3 times.  She continues to have bilateral shoulder pain at night due to hypermobility.  She has been to physical therapy in the past for her shoulder pain.  She denies any joint swelling at this time.  She continues to get symptoms of Raynaud's in bilateral hands but denies any digital ulcerations.  She continues to get sores in her nose but no mouth sores.  She continues to have chronic sicca symptoms.  She denies any recent rashes.  She denies any swollen lymph nodes or low-grade fevers.  She denies any SOB or palpitations.  She reports she continues to have right carpal tunnel especially at night.  She does not have a brace.   Activities of Daily Living:  Patient reports morning stiffness for 0  minute.   Patient Reports nocturnal pain.  Difficulty dressing/grooming: Denies Difficulty climbing stairs: Denies Difficulty getting out of chair: Denies Difficulty using hands for taps, buttons, cutlery, and/or writing: Reports  Review of Systems  Constitutional: Negative for fatigue.  HENT: Positive for mouth dryness and nose dryness (Nose sores). Negative for  mouth sores.   Eyes: Positive for dryness. Negative for pain and visual disturbance.  Respiratory: Negative for cough, hemoptysis, shortness of breath and difficulty breathing.   Cardiovascular: Negative for chest pain, palpitations, hypertension and swelling in legs/feet.  Gastrointestinal: Positive for constipation. Negative for blood in stool and diarrhea.  Endocrine: Negative for increased urination.  Genitourinary: Negative for painful urination.  Musculoskeletal: Positive for arthralgias and joint pain. Negative for joint swelling, myalgias, muscle weakness, morning stiffness, muscle tenderness and myalgias.  Skin: Positive for color change and sensitivity to sunlight. Negative for pallor, rash, hair loss, nodules/bumps, skin tightness and ulcers.  Allergic/Immunologic: Negative for susceptible to infections.  Neurological: Positive for headaches (Hx of migraines ). Negative for dizziness, numbness and weakness.  Hematological: Negative for swollen glands.  Psychiatric/Behavioral: Negative for depressed mood and sleep disturbance. The patient is not nervous/anxious.     PMFS History:  Patient Active Problem List   Diagnosis Date Noted  . History of hyperlipidemia 11/08/2016  . History of anxiety 11/08/2016  . History of retinal tear 11/08/2016  . History of Bilateral plantar fasciitis 08/10/2016  . High risk medication use 07/04/2016  . Lupus ([place]) 05/23/2016  . 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 ADHD (attention deficit hyperactivity disorder) 12/22/2014  . Raynaud's phenomenon 10/17/2013  . Hyperlipidemia 09/28/2011    Past Medical History:  Diagnosis Date  . ADD (attention deficit disorder)   . Allergy   . Anemia   . Anxiety   . Hyperlipidemia   . Ileitis   . Lupus ([place])     Family History  Problem Relation Age of Onset  . Heart disease Mother 38       CAD/stenting  . Hyperlipidemia Mother   . Arthritis Mother        ostearthritis  . Hypertension Mother   .  Kidney disease Mother   . Irritable bowel syndrome Mother   . Stroke Mother 28       CVA  . Fibromyalgia Mother   . Hyperlipidemia Father   . Neuropathy Father   . Hyperlipidemia Sister   . Hypertension Brother   . Colon cancer Neg Hx   . Inflammatory bowel disease Neg Hx    Past Surgical History:  Procedure Laterality Date  . CESAREAN SECTION     x 2  . DENTAL SURGERY    . EYE SURGERY Bilateral    cryoretinopexy   Social History   Social History Narrative   Marital status: married x 17 years; happily married      Children: 2 children (22, 66)      Lives: with husband, 2 children.      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 Influenza,inj,Quad PF,6+ Mos 05/12/2014, 11/27/2015, 05/23/2016, 10/30/2017, 07/30/2018  . Pneumococcal Conjugate-13 05/23/2016  . Tdap 09/28/2011    Objective: Vital Signs: BP 120/78 (BP Location: Left Arm, Patient Position: Sitting, Cuff Size: Normal)   Pulse 82   Resp 12   Ht 5' 6"  (1.676 m)   Wt 151 lb 3.2 oz (68.6 kg)   BMI 24.40 kg/m    Physical Exam Vitals signs and nursing note reviewed.  Constitutional:      Appearance: She is well-developed.  HENT:     Head: Normocephalic and atraumatic.     Comments: No parotid swelling    Nose:     Comments: Nasal ulcerations present    Mouth/Throat:     Comments: No oral ulcerations  Eyes:     Conjunctiva/sclera: Conjunctivae normal.  Neck:     Musculoskeletal: Normal range of motion.  Cardiovascular:     Rate and Rhythm: Normal rate and regular rhythm.     Heart sounds: Normal heart sounds.  Pulmonary:     Effort: Pulmonary effort is normal.     Breath sounds: Normal breath sounds.  Abdominal:     General: Bowel sounds are normal.     Palpations: Abdomen is  soft.  Lymphadenopathy:     Cervical: No cervical adenopathy.  Skin:    General: Skin is warm and dry.     Capillary Refill: Capillary refill takes less than 2 seconds.     Comments: No malar rash noted No digital ulcerations or signs of gangrene noted.   Neurological:     Mental Status: She is alert and oriented to person, place, and time.  Psychiatric:        Behavior: Behavior normal.      Musculoskeletal Exam: C-spine, thoracic spine, lumbar spine good range of motion.  No midline spinal tenderness.  No SI joint tenderness.  Shoulder joints, elbow joints, wrist joints, MCPs, PIPs and DIPs good range of motion with no synovitis.  Hip joints, knee joints, ankle joints, MTPs, PIPs, DIPs good range of motion with no synovitis.  No warmth or effusion bilateral knee joints.  She has bilateral knee crepitus.  No tenderness or swelling of ankle joints.  No plantar fasciitis or Achilles tendinitis.  She synovial thickening of bilateral first MTP joints worse on the left.  CDAI Exam: CDAI Score: Not documented Patient Global Assessment: Not documented; Provider Global Assessment: Not documented Swollen: Not documented; Tender: Not documented Joint Exam   Not documented   There is currently no information documented on the homunculus. Go to the Rheumatology activity and complete the homunculus joint exam.  Investigation: No additional findings.  Imaging: No results found.  Recent Labs: Lab Results  Component Value Date   WBC 4.4 (A) 07/30/2018   HGB 12.8 07/30/2018   PLT 236 02/06/2018   NA 139 02/06/2018   K 4.0 02/06/2018   CL 104 02/06/2018   CO2 31 02/06/2018   GLUCOSE 73 02/06/2018   BUN 14 02/06/2018   CREATININE 0.80 02/06/2018   BILITOT 0.5 02/06/2018   ALKPHOS 27 (L) 04/19/2017   AST 13 02/06/2018   ALT 9 02/06/2018   PROT 6.5 02/06/2018   ALBUMIN 4.8 04/19/2017   CALCIUM 9.1 02/06/2018   GFRAA 104 02/06/2018    Speciality Comments: PLQ Eye Exam: 02/16/18 WNL  Follow up in 1 year    Plaquenil 200 mg twice daily Monday through Friday.  Last Plaquenil eye exam normal on 02/16/2018.  Most recent CBC/CMP stable on 02/06/2018.  Due for CBC/CMP today and then every 5 months.  Patient received flu vaccine in November and had Prevnar 13 vaccine.  Recommend Pneumovax 23 as indicated. Procedures:  No procedures performed Allergies: Avelox [moxifloxacin hcl in nacl]; Cefaclor; Latex; Red wine complex [germanium]; Meloxicam; and Naproxen   Assessment / Plan:     Visit Diagnoses: Other systemic lupus erythematosus with other organ involvement ([place]) -She has not had any recent signs or symptoms of a lupus flare.  She is clinically doing well on Plaquenil 200 mg 1 tablet by mouth twice daily Monday through Friday.  She has no synovitis on exam.  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  She continues to get intermittent nasal ulcerations but no oral ulcerations.  She is not had any recent rashes and no malar rash was noted on exam.  She continues to have sicca symptoms but no parotid swelling was noted.  She has intermittent symptoms of Raynolds but no digital ulcerations or signs of gangrene were noted.  She has not had any recent hair loss.  No shortness of breath or palpitations.  No pericardial or pleural rub auscultated on exam.  No cervical lymphadenopathy was palpated.  She has not had any recent low-grade fevers or infections.  She will continue on Plaquenil 200 mg 1 tablet by mouth twice daily Monday through Friday.  A refill was sent to the pharmacy today.  We will check autoimmune labs today.  She was advised to notify us if she has any new or worsening symptoms.  She will follow-up in the office in 5 months.  Plan: CBC with Differential/Platelet, COMPLETE METABOLIC PANEL WITH GFR, Urinalysis, Routine w reflex microscopic, Anti-DNA antibody, double-stranded, C3 and C4, Sedimentation rate, VITAMIN D 25 Hydroxy (Vit-D Deficiency, Fractures)  High  risk medication use - Plaquenil 200 mg by mouth twice a day  Monday to Friday.  CBC and CMP will be drawn today to monitor for drug toxicity.- Plan: CBC with Differential/Platelet, COMPLETE METABOLIC PANEL WITH GFR  Raynaud's phenomenon without gangrene: She has intermittent symptoms of Raynaud's.  She has no digital ulcerations or signs of gangrene.  She was encouraged to keep her core body temperature warm and wear gloves on a regular basis.   Erythema nodosum: She has no nodules at this time.  She has not had any recent recurrences.  Plantar fasciitis: Resolved.  She was proper fitting shoes and stretches on a regular basis.  Other fatigue: Her energy level has been improving recently.  She is been working out on a regular basis.  Right hand paresthesia: She has been having symptoms of right carpal tunnel intermittently.  Her symptoms have been worsening recently.  She has been having symptoms wake her up at night.  She was given a prescription for a right carpal tunnel night splint.  She was advised to notify us if her symptoms persist.  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 CBC with Differential/Platelet  . COMPLETE METABOLIC PANEL WITH GFR  . Urinalysis, Routine w reflex microscopic  . Anti-DNA antibody, double-stranded  . C3 and C4  . Sedimentation rate  . VITAMIN D 25 Hydroxy (Vit-D Deficiency, Fractures)   Meds ordered this encounter  Medications  . hydroxychloroquine (PLAQUENIL) 200 MG tablet    Sig: Take 1 tablet by mouth twice daily Monday through Friday only.    Dispense:  120 tablet    Refill:  0    Face-to-face time spent with patient was 30 minutes. Greater than 50% of time was spent in counseling and  coordination of care.  Follow-Up Instructions: No follow-ups on file.   Ofilia Neas, PA-C  Note - This record has been created using Dragon software.  Chart creation errors have been sought, but may not always  have been located. Such creation errors do not reflect on  the standard of medical care.

## 2018-08-31 ENCOUNTER — Ambulatory Visit: Payer: BC Managed Care – PPO | Admitting: Physician Assistant

## 2018-08-31 ENCOUNTER — Encounter: Payer: Self-pay | Admitting: Physician Assistant

## 2018-08-31 VITALS — BP 120/78 | HR 82 | Resp 12 | Ht 66.0 in | Wt 151.2 lb

## 2018-08-31 DIAGNOSIS — L52 Erythema nodosum: Secondary | ICD-10-CM | POA: Diagnosis not present

## 2018-08-31 DIAGNOSIS — I7300 Raynaud's syndrome without gangrene: Secondary | ICD-10-CM | POA: Diagnosis not present

## 2018-08-31 DIAGNOSIS — Z79899 Other long term (current) drug therapy: Secondary | ICD-10-CM

## 2018-08-31 DIAGNOSIS — M722 Plantar fascial fibromatosis: Secondary | ICD-10-CM

## 2018-08-31 DIAGNOSIS — R202 Paresthesia of skin: Secondary | ICD-10-CM

## 2018-08-31 DIAGNOSIS — M3219 Other organ or system involvement in systemic lupus erythematosus: Secondary | ICD-10-CM

## 2018-08-31 DIAGNOSIS — R5383 Other fatigue: Secondary | ICD-10-CM

## 2018-08-31 DIAGNOSIS — K5000 Crohn's disease of small intestine without complications: Secondary | ICD-10-CM

## 2018-08-31 DIAGNOSIS — Z8669 Personal history of other diseases of the nervous system and sense organs: Secondary | ICD-10-CM

## 2018-08-31 DIAGNOSIS — Z8659 Personal history of other mental and behavioral disorders: Secondary | ICD-10-CM

## 2018-08-31 DIAGNOSIS — Z8639 Personal history of other endocrine, nutritional and metabolic disease: Secondary | ICD-10-CM

## 2018-08-31 DIAGNOSIS — F900 Attention-deficit hyperactivity disorder, predominantly inattentive type: Secondary | ICD-10-CM

## 2018-08-31 MED ORDER — HYDROXYCHLOROQUINE SULFATE 200 MG PO TABS: ORAL_TABLET | ORAL | 0 refills | Status: DC

## 2018-09-03 NOTE — Progress Notes (Signed)
CBC WNL. UA normal. Sed rate WNL. Complements stable. Alk phos low but stable. Vitamin D is within desirable range.  Please advise patient to continue on maintenance dose of vitamin D.

## 2018-09-04 LAB — URINALYSIS, ROUTINE W REFLEX MICROSCOPIC
Bilirubin Urine: NEGATIVE
Glucose, UA: NEGATIVE
Hgb urine dipstick: NEGATIVE
Ketones, ur: NEGATIVE
Leukocytes, UA: NEGATIVE
Nitrite: NEGATIVE
Protein, ur: NEGATIVE
Specific Gravity, Urine: 1.004 (ref 1.001–1.03)
pH: 7.5 (ref 5.0–8.0)

## 2018-09-04 LAB — COMPLETE METABOLIC PANEL WITH GFR
AG Ratio: 1.7 (calc) (ref 1.0–2.5)
ALT: 11 U/L (ref 6–29)
AST: 20 U/L (ref 10–35)
Albumin: 4.6 g/dL (ref 3.6–5.1)
Alkaline phosphatase (APISO): 26 U/L — ABNORMAL LOW (ref 33–115)
BUN: 12 mg/dL (ref 7–25)
CO2: 27 mmol/L (ref 20–32)
Calcium: 9.5 mg/dL (ref 8.6–10.2)
Chloride: 103 mmol/L (ref 98–110)
Creat: 0.91 mg/dL (ref 0.50–1.10)
GFR, Est African American: 88 mL/min/{1.73_m2} (ref >=60)
GFR, Est Non African American: 76 mL/min/{1.73_m2} (ref >=60)
Globulin: 2.7 g/dL (calc) (ref 1.9–3.7)
Glucose, Bld: 81 mg/dL (ref 65–99)
Potassium: 4.1 mmol/L (ref 3.5–5.3)
Sodium: 137 mmol/L (ref 135–146)
Total Bilirubin: 0.6 mg/dL (ref 0.2–1.2)
Total Protein: 7.3 g/dL (ref 6.1–8.1)

## 2018-09-04 LAB — CBC WITH DIFFERENTIAL/PLATELET
Absolute Monocytes: 380 cells/uL (ref 200–950)
Basophils Absolute: 19 cells/uL (ref 0–200)
Basophils Relative: 0.5 %
Eosinophils Absolute: 91 cells/uL (ref 15–500)
Eosinophils Relative: 2.4 %
HCT: 40.1 % (ref 35.0–45.0)
Hemoglobin: 13.3 g/dL (ref 11.7–15.5)
Lymphs Abs: 1049 cells/uL (ref 850–3900)
MCH: 31 pg (ref 27.0–33.0)
MCHC: 33.2 g/dL (ref 32.0–36.0)
MCV: 93.5 fL (ref 80.0–100.0)
MPV: 10.5 fL (ref 7.5–12.5)
Monocytes Relative: 10 %
Neutro Abs: 2261 cells/uL (ref 1500–7800)
Neutrophils Relative %: 59.5 %
Platelets: 241 10*3/uL (ref 140–400)
RBC: 4.29 10*6/uL (ref 3.80–5.10)
RDW: 12.8 % (ref 11.0–15.0)
Total Lymphocyte: 27.6 %
WBC: 3.8 10*3/uL (ref 3.8–10.8)

## 2018-09-04 LAB — C3 AND C4
C3 Complement: 79 mg/dL — ABNORMAL LOW (ref 83–193)
C4 Complement: 24 mg/dL (ref 15–57)

## 2018-09-04 LAB — ANTI-DNA ANTIBODY, DOUBLE-STRANDED: ds DNA Ab: 1 IU/mL

## 2018-09-04 LAB — SEDIMENTATION RATE: Sed Rate: 2 mm/h (ref 0–20)

## 2018-09-04 LAB — VITAMIN D 25 HYDROXY (VIT D DEFICIENCY, FRACTURES): Vit D, 25-Hydroxy: 37 ng/mL (ref 30–100)

## 2018-09-06 NOTE — Progress Notes (Signed)
DsDNA negative.

## 2018-10-04 ENCOUNTER — Encounter: Payer: Self-pay | Admitting: Family Medicine

## 2018-10-04 ENCOUNTER — Other Ambulatory Visit: Payer: Self-pay

## 2018-10-04 ENCOUNTER — Ambulatory Visit (INDEPENDENT_AMBULATORY_CARE_PROVIDER_SITE_OTHER): Payer: BC Managed Care – PPO | Admitting: Family Medicine

## 2018-10-04 VITALS — BP 113/79 | HR 84 | Temp 99.1°F | Ht 66.0 in | Wt 150.0 lb

## 2018-10-04 DIAGNOSIS — N920 Excessive and frequent menstruation with regular cycle: Secondary | ICD-10-CM

## 2018-10-04 DIAGNOSIS — E010 Iodine-deficiency related diffuse (endemic) goiter: Secondary | ICD-10-CM | POA: Diagnosis not present

## 2018-10-04 DIAGNOSIS — E7800 Pure hypercholesterolemia, unspecified: Secondary | ICD-10-CM

## 2018-10-04 DIAGNOSIS — J302 Other seasonal allergic rhinitis: Secondary | ICD-10-CM

## 2018-10-04 LAB — POCT CBC
Granulocyte percent: 72.3 %G (ref 37–80)
HCT, POC: 37.7 % (ref 29–41)
Hemoglobin: 12.9 g/dL (ref 11–14.6)
Lymph, poc: 1.4 (ref 0.6–3.4)
MCH, POC: 32.1 pg — AB (ref 27–31.2)
MCHC: 34.2 g/dL (ref 31.8–35.4)
MCV: 94 fL (ref 76–111)
MID (cbc): 0.1 — AB (ref 0–0.9)
MPV: 7.3 fL (ref 0–99.8)
POC Granulocyte: 41 — AB (ref 2–6.9)
POC LYMPH PERCENT: 25.4 %L (ref 10–50)
POC MID %: 2.3 %M (ref 0–12)
Platelet Count, POC: 288 10*3/uL (ref 142–424)
RBC: 4.01 M/uL — AB (ref 4.04–5.48)
RDW, POC: 12.6 %
WBC: 5.7 10*3/uL (ref 4.6–10.2)

## 2018-10-04 LAB — POCT WET + KOH PREP
Trich by wet prep: ABSENT
Yeast by KOH: ABSENT
Yeast by wet prep: ABSENT

## 2018-10-04 LAB — POCT URINE PREGNANCY: Preg Test, Ur: NEGATIVE

## 2018-10-04 NOTE — Addendum Note (Signed)
Addended by: Rutherford Guys on: 10/04/2018 10:59 AM   Modules accepted: Orders

## 2018-10-04 NOTE — Addendum Note (Signed)
Addended by: Ileana Roup on: 10/04/2018 11:05 AM   Modules accepted: Orders

## 2018-10-04 NOTE — Patient Instructions (Signed)
° ° ° °  If you have lab work done today you will be contacted with your lab results within the next 2 weeks.  If you have not heard from us then please contact us. The fastest way to get your results is to register for My Chart. ° ° °IF you received an x-ray today, you will receive an invoice from [facility]. Please contact [facility] at [phone] with questions or concerns regarding your invoice.  ° °IF you received labwork today, you will receive an invoice from [facility]. Please contact [facility] at [phone] with questions or concerns regarding your invoice.  ° °Our billing staff will not be able to assist you with questions regarding bills from these companies. ° °You will be contacted with the lab results as soon as they are available. The fastest way to get your results is to activate your My Chart account. Instructions are located on the last page of this paperwork. If you have not heard from us regarding the results in 2 weeks, please contact this office. °  ° ° ° °

## 2018-10-04 NOTE — Progress Notes (Signed)
1/23/202010:04 AM  Robin [name] [birth year], 48 y.o. female [id number]  Chief Complaint  Patient presents with  . Menorrhagia    having heavy periods for the past 2 months lasting 14-16 days    HPI:   Patient is a 47 y.o. female with past medical history significant for SLE, ADD, depression who presents today for heavy periods  Seen in nov 2019 for similar concerns, that period lasted 14 days Most recent period lasted 16 days, LMP 09/03/2018 Exercise makes it worse Had to use pads and tampons simultaneously Having about 6 days of very heavy bleeding with clots Normal periods are very heavy on 2nd day, requiring changing tampons every hour, for about 12 hours, total period last about 5 days Has been having worsening menstrual migraines and carpal tunnel Has been feeling tired and some intermittent palpitations but no dizziness, chest pain, SOB Uses condoms for [facility] Denies any vaginal discharge Has been having pain with sexual intercourse Gets bloated Chronic constipation, stable H/o remote ovarian cyst No hot flashes, night sweats, mood changes No changes in skin or hair, no neck swelling or tenderness  Sees rheum q 6 months Gets rash with aleve Can tolerate mild ibu, gets GI upset Normal pap and hpv in 2018 G2P2, c sections x 2  Requesting referral to allergist as is interested in possible immunotherapy  Fall Risk  10/04/2018 07/30/2018 02/02/2018 12/15/2017 10/30/2017  Falls in the past year? 1 1 No No No  Comment [place] ankle 2 x - - - -  Number falls in past yr: 1 0 - - -  Injury with Fall? 1 1 - - -  Comment - RIGHT ANKLE - TWISTED - - -     Depression screen [facility] 2/9 02/02/2018 10/30/2017 05/24/2017  Decreased Interest 0 2 0  Down, Depressed, Hopeless 0 2 0  PHQ - 2 Score 0 4 0  Altered sleeping - 2 -  Tired, decreased energy - 3 -  Change in appetite - 3 -  Feeling bad or failure about yourself  - 3 -  Trouble concentrating - 3 -  Moving slowly or  fidgety/restless - 3 -  Suicidal thoughts - 1 -  PHQ-9 Score - 22 -  Difficult doing work/chores - Somewhat difficult -    Allergies  Allergen Reactions  . Avelox [Moxifloxacin Hcl In Nacl] Other (See Comments)    Muscle cramps  . Cefaclor Hives    Tolerates penicillins  . Latex Other (See Comments)    "Raw skin"  . Red Wine Complex [Germanium]     Itching   . Meloxicam Rash  . Naproxen Rash    Prior to Admission medications   Medication Sig Start Date End Date Taking? Authorizing Provider  acetaminophen (TYLENOL) 500 MG tablet Take 1,000 mg by mouth every 6 (six) hours as needed for mild pain.   Yes [provider]  albuterol (PROVENTIL HFA;VENTOLIN HFA) 108 (90 Base) MCG/ACT inhaler Inhale 2 puffs into the lungs every 6 (six) hours as needed for wheezing or shortness of breath. 08/27/16  Yes Tereasa Coop, PA-C  amphetamine-dextroamphetamine (ADDERALL) 10 MG tablet Take 10 mg once a day to twice daily. Patient taking differently: 2 (two) times daily with a meal. Take 10 mg once a day to twice daily. 10/12/17  Yes Wardell Honour, MD  azelastine (ASTELIN) 0.1 % nasal spray PLACE 2 SPRAYS INTO BOTH NOSTRILS TWICE DAILY Patient taking differently: as needed.  10/06/17  Yes Wardell Honour, MD  Boric Acid Topical POWD Place vaginally. 09/08/15  Yes [provider]  buPROPion (WELLBUTRIN XL) 300 MG 24 hr tablet  02/04/18  Yes [provider]  DiphenhydrAMINE HCl (BENADRYL ALLERGY PO) Take by mouth as needed.   Yes [provider]  hydroxychloroquine (PLAQUENIL) 200 MG tablet Take 1 tablet by mouth twice daily Monday through Friday only. 08/31/18  Yes Ofilia Neas, PA-C  loratadine (CLARITIN) 10 MG tablet Take 10 mg by mouth daily.   Yes [provider]  Omega 3-6-9 Fatty Acids (OMEGA-3-6-9 PO) Take 2 capsules by mouth daily.   Yes [provider]  sodium chloride (OCEAN) 0.65 % nasal spray Place 1 spray into the nose daily as  needed for congestion.    Yes [provider]  TURMERIC PO Take by mouth 4 (four) times daily.   Yes [provider]  VITAMIN D, CHOLECALCIFEROL, PO Take 500 mg by mouth.   Yes [provider]    Past Medical History:  Diagnosis Date  . ADD (attention deficit disorder)   . Allergy   . Anemia   . Anxiety   . Hyperlipidemia   . Ileitis   . Lupus [facility])     Past Surgical History:  Procedure Laterality Date  . CESAREAN SECTION     x 2  . DENTAL SURGERY    . EYE SURGERY Bilateral    cryoretinopexy    Social History   Tobacco Use  . Smoking status: Former Smoker    Packs/day: 0.75    Years: 10.00    Pack years: 7.50    Types: Cigarettes    Last attempt to quit: 07/05/2001    Years since quitting: 17.2  . Smokeless tobacco: Never Used  Substance Use Topics  . Alcohol use: Yes    Alcohol/week: 14.0 standard drinks    Types: 14 Cans of beer per week    Comment: 2 beer daily    Family History  Problem Relation Age of Onset  . Heart disease Mother 3       CAD/stenting  . Hyperlipidemia Mother   . Arthritis Mother        ostearthritis  . Hypertension Mother   . Kidney disease Mother   . Irritable bowel syndrome Mother   . Stroke Mother 21       CVA  . Fibromyalgia Mother   . Hyperlipidemia Father   . Neuropathy Father   . Hyperlipidemia Sister   . Hypertension Brother   . Colon cancer Neg Hx   . Inflammatory bowel disease Neg Hx     ROS Per hpi  OBJECTIVE:  Blood pressure 113/79, pulse 84, temperature 99.1 F (37.3 C), temperature source Oral, height 5' 6"  (1.676 m), weight 150 lb (68 kg), last menstrual period 09/03/2018, SpO2 100 %. Body mass index is 24.21 kg/m.   Physical Exam Vitals signs and nursing note reviewed. Exam conducted with a chaperone present.  Constitutional:      Appearance: She is well-developed.  HENT:     Head: Normocephalic and atraumatic.     Right Ear: Hearing, tympanic membrane, ear canal and  external ear normal.     Left Ear: Hearing, tympanic membrane, ear canal and external ear normal.  Eyes:     Conjunctiva/sclera: Conjunctivae normal.     Pupils: Pupils are equal, round, and reactive to light.  Neck:     Musculoskeletal: Neck supple.     Thyroid: Thyromegaly present. No thyroid mass or thyroid tenderness.  Cardiovascular:     Rate and Rhythm: Normal rate and regular rhythm.     Heart sounds: Normal heart sounds. No murmur. No friction rub. No gallop.   Pulmonary:     Effort: Pulmonary effort is normal.     Breath sounds: Normal breath sounds. No wheezing or rales.  Genitourinary:    General: Normal vulva.     Vagina: Normal.     Cervix: No cervical motion tenderness, friability or cervical bleeding.     Uterus: Enlarged and tender. Not fixed.      Adnexa:        Right: No mass or tenderness.         Left: No mass or tenderness.    Lymphadenopathy:     Cervical: No cervical adenopathy.  Skin:    General: Skin is warm and dry.  Neurological:     Mental Status: She is alert and oriented to person, place, and time.     Results for orders placed or performed in visit on 10/04/18 (from the past 24 hour(s))  POCT CBC     Status: Abnormal   Collection Time: 10/04/18  9:58 AM  Result Value Ref Range   WBC 5.7 4.6 - 10.2 K/uL   Lymph, poc 1.4 0.6 - 3.4   POC LYMPH PERCENT 25.4 10 - 50 %L   MID (cbc) 0.1 (A) 0 - 0.9   POC MID % 2.3 0 - 12 %M   POC Granulocyte 41 (A) 2 - 6.9   Granulocyte percent 72.3 37 - 80 %G   RBC 4.01 (A) 4.04 - 5.48 M/uL   Hemoglobin 12.9 11 - 14.6 g/dL   HCT, POC 37.7 29 - 41 %   MCV 94.0 76 - 111 fL   MCH, POC 32.1 (A) 27 - 31.2 pg   MCHC 34.2 31.8 - 35.4 g/dL   RDW, POC 12.6 %   Platelet Count, POC 288 142 - 424 K/uL   MPV 7.3 0 - 99.8 fL  POCT urine pregnancy     Status: None   Collection Time: 10/04/18 10:39 AM  Result Value Ref Range   Preg Test, Ur Negative Negative      ASSESSMENT and PLAN  1. Menorrhagia with regular  cycle No anemia, VSS. Discussed workup. RTC precautions reviewed - POCT CBC - US Pelvic Complete With Transvaginal; Future - US Soft Tissue Head/Neck; Future - POCT urine pregnancy  2. Thyromegaly - Prolactin - T4, Free - US Soft Tissue Head/Neck; Future  3. Pure hypercholesterolemia - TSH - Lipid panel  4. Seasonal allergies - Ambulatory referral to Allergy    Return for after ultrasounds.    Rutherford Guys, MD [facility] at [place], [place] [address] Ph.  [phone] Fax [phone]

## 2018-10-05 ENCOUNTER — Ambulatory Visit
Admission: RE | Admit: 2018-10-05 | Discharge: 2018-10-05 | Disposition: A | Discharge status: Home / Self Care | Payer: BLUE CROSS/BLUE SHIELD | Source: Ambulatory Visit | Attending: Family Medicine | Admitting: Family Medicine

## 2018-10-05 ENCOUNTER — Telehealth: Payer: Self-pay | Admitting: Family Medicine

## 2018-10-05 DIAGNOSIS — E010 Iodine-deficiency related diffuse (endemic) goiter: Secondary | ICD-10-CM

## 2018-10-05 DIAGNOSIS — N920 Excessive and frequent menstruation with regular cycle: Secondary | ICD-10-CM

## 2018-10-05 LAB — TSH: TSH: 2.81 u[IU]/mL (ref 0.450–4.500)

## 2018-10-05 LAB — LIPID PANEL
Chol/HDL Ratio: 3.1 ratio (ref 0.0–4.4)
Cholesterol, Total: 211 mg/dL — ABNORMAL HIGH (ref 100–199)
HDL: 68 mg/dL (ref >39)
LDL Calculated: 117 mg/dL — ABNORMAL HIGH (ref 0–99)
Triglycerides: 128 mg/dL (ref 0–149)
VLDL Cholesterol Cal: 26 mg/dL (ref 5–40)

## 2018-10-05 LAB — PROLACTIN: Prolactin: 9.2 ng/mL (ref 4.8–23.3)

## 2018-10-05 LAB — T4, FREE: Free T4: 1.23 ng/dL (ref 0.82–1.77)

## 2018-10-05 NOTE — Telephone Encounter (Signed)
Copied from [place] [id number]. Topic: Appointment Scheduling - Scheduling Inquiry for Clinic >> Oct 05, 2018  3:00 PM Percell Belt A wrote: Reason for CRM:   Pt called in and stated that Dr Pamella Pert told her to call back once she had her ultra sound and make a fu appt.  Next available  is in march.  Pt needs to be seen before then.  Please advise   Best number [phone]

## 2018-10-08 LAB — GC/CHLAMYDIA PROBE AMP
Chlamydia trachomatis, NAA: NEGATIVE
Neisseria gonorrhoeae by PCR: NEGATIVE

## 2018-10-08 NOTE — Telephone Encounter (Signed)
Please schedule, same day appointment is ok.

## 2018-10-09 ENCOUNTER — Encounter: Payer: Self-pay | Admitting: Family Medicine

## 2018-10-09 DIAGNOSIS — D259 Leiomyoma of uterus, unspecified: Secondary | ICD-10-CM

## 2018-10-09 DIAGNOSIS — N920 Excessive and frequent menstruation with regular cycle: Secondary | ICD-10-CM

## 2018-10-10 MED ORDER — SUMATRIPTAN SUCCINATE 50 MG PO TABS: ORAL_TABLET | ORAL | 0 refills | Status: DC

## 2018-10-11 ENCOUNTER — Other Ambulatory Visit: Payer: Self-pay | Admitting: Physician Assistant

## 2018-10-25 ENCOUNTER — Encounter: Payer: Self-pay | Admitting: Family Medicine

## 2018-10-25 NOTE — Telephone Encounter (Signed)
Copied from [place] [phone]. Topic: Referral - Status >> Oct 25, 2018 12:18 PM Carolyn Stare wrote:  Pt call back to fup on her req for a referral to a GYN, would like a call back

## 2018-11-02 ENCOUNTER — Ambulatory Visit: Payer: BC Managed Care – PPO | Admitting: Family Medicine

## 2018-11-06 ENCOUNTER — Ambulatory Visit: Payer: BC Managed Care – PPO | Admitting: Allergy & Immunology

## 2018-11-06 ENCOUNTER — Encounter: Payer: Self-pay | Admitting: Allergy & Immunology

## 2018-11-06 VITALS — BP 98/60 | HR 77 | Temp 98.1°F | Resp 16 | Ht 66.0 in | Wt 155.0 lb

## 2018-11-06 DIAGNOSIS — L2089 Other atopic dermatitis: Secondary | ICD-10-CM | POA: Diagnosis not present

## 2018-11-06 DIAGNOSIS — J069 Acute upper respiratory infection, unspecified: Secondary | ICD-10-CM

## 2018-11-06 DIAGNOSIS — T781XXD Other adverse food reactions, not elsewhere classified, subsequent encounter: Secondary | ICD-10-CM | POA: Diagnosis not present

## 2018-11-06 DIAGNOSIS — J302 Other seasonal allergic rhinitis: Secondary | ICD-10-CM

## 2018-11-06 DIAGNOSIS — B9789 Other viral agents as the cause of diseases classified elsewhere: Secondary | ICD-10-CM

## 2018-11-06 DIAGNOSIS — J3089 Other allergic rhinitis: Secondary | ICD-10-CM | POA: Diagnosis not present

## 2018-11-06 DIAGNOSIS — J4520 Mild intermittent asthma, uncomplicated: Secondary | ICD-10-CM | POA: Insufficient documentation

## 2018-11-06 MED ORDER — MONTELUKAST SODIUM 10 MG PO TABS: 10.0000 mg | ORAL_TABLET | Freq: Every day | ORAL | 5 refills | Status: DC

## 2018-11-06 NOTE — Progress Notes (Signed)
NEW PATIENT  Date of Service/Encounter:  11/06/18  Referring provider: Rutherford Guys, MD   Assessment:   Seasonal and perennial allergic rhinitis (grasses, indoor molds, outdoor molds, dust mites, cat and cockroach)  Mild intermittent asthma, uncomplicated  Adverse food reaction (pineapple, banana) - with negative testing today  Flexural atopic dermatitis  Plan/Recommendations:   1. Chronic rhinitis - Testing today showed: grasses, indoor molds, outdoor molds, dust mites, cat and cockroach - Copy of test results provided.  - Avoidance measures provided. - Stop taking: Claritin - Continue with: Astelin (azelastine) 2 sprays per nostril 1-2 times daily as needed - Start taking: Allegra (fexofenadine) 141m table once daily and Singulair (montelukast) 169mdaily - You can use an extra dose of the antihistamine, if needed, for breakthrough symptoms.  - Consider nasal saline rinses 1-2 times daily to remove allergens from the nasal cavities as well as help with mucous clearance (this is especially helpful to do before the nasal sprays are given) - Consider allergy shots as a means of long-term control. - Allergy shots "re-train" and "reset" the immune system to ignore environmental allergens and decrease the resulting immune response to those allergens (sneezing, itchy watery eyes, runny nose, nasal congestion, etc).    - Allergy shots improve symptoms in 75-85% of patients.  - We can discuss more at the next appointment if the medications are not working for you.  2. Mild intermittent asthma, uncomplicated - Lung testing looked normal today. - I do not think that a controller medication is needed at this time. - Continue with albuterol four puffs every 4-6 hours as needed.  3. Adverse food reaction - Testing was negative to pineapple and banana. - The milk reaction is likely related to lactose intolerance.  - These could be related to either the acidity of the foods or  something else, but the negative skin testing is reassuring.   4. Flexural atopic dermatitis - Continue with moisturizing as you are doing.   5. Return in about 3 months (around 02/04/2019).   Subjective:   Robin POTVINs a [age].o. female presenting today for evaluation of  Chief Complaint  Patient presents with  . Allergic Rhinitis     Robin MCHANEYas a history of the following: Patient Active Problem List   Diagnosis Date Noted  . Seasonal and perennial allergic rhinitis 11/06/2018  . Mild intermittent asthma, uncomplicated 0200/93/8182. Flexural atopic dermatitis 11/06/2018  . History of hyperlipidemia 11/08/2016  . History of anxiety 11/08/2016  . History of retinal tear 11/08/2016  . History of Bilateral plantar fasciitis 08/10/2016  . High risk medication use 07/04/2016  . Lupus ([facility]/07/2016  . 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 ADHD (attention deficit hyperactivity disorder) 12/22/2014  . Raynaud's phenomenon 10/17/2013  . Hyperlipidemia 09/28/2011    History obtained from: chart review and patient.  Robin BACCHIas referred by SaRutherford GuysMD.     VaZahirahs a [age].o. female presenting for an evaluation of allergic rhinitis. She feels that her alleriges shave been out of control since she was on a 4 month course of steroids. She weaned off of those the end of 2017. This was when she was diagnosed with lupus. She is now on Plaquenil. This has since kept her symptoms at [place] 3 years. [place] has been [place]   Asthma/Respiratory Symptom History: She does report that she has wheezing in  certain allergic situations. She does have an albuterol inhaler. She rarely needs it and she goes through a whole inhaler in one year or more. She does get steroids for breathing purposes, around four times in the last ten years. She has needed it for sinus issues as well, but with the surgery she has only needed it twice or so.  She denies night time coughing. She denies problems with ADLs.  Allergic Rhinitis Symptom History: She reports that she has always had allergy symptoms but they have certainly gotten worse since the steroid wean in 2017. She has a problem when she [place] her [place]. She feels that the dampness gives her bronchitis. Damp conditions are her worse. She does report some nasal congestion and eye itching with throat itching as well. Fall is a bad season for her as well. She has tried using OTC antihistamines with Claritin or Benadryl. She has never tried Human resources officer. Zyrtec makes her moody. She has tried azelastine which makes her feel dizzy. She also reports that there is a taste in the back of her throat that makes her gag. She did have a tooth found on her sinuses in 2009 and once this was removed her symptoms improved. She has had only two in the last ten years.   Food Allergy Symptom History: She tells me that pineapple causes mouth burning. She can do canned pineapples and cooked pineapples without problems. Milk causes gassiness, but she seems to do fine with yogurt and cheese. She does not get itchy with the milk at all. She does have itching with red wine as well. She has flares with exposure to upholstery. It also causes her eyes to itch as well.   Eczema Symptom Symptom History: She has a history of eczema that is worse in February through July. This is mostly isolated to her hands and wrist. She does not treat it with anything in particular. She did see a Dermatologist when she was younger as a child.  Otherwise, there is no history of other atopic diseases, including drug allergies, stinging insect allergies, urticaria or contact dermatitis. There is no significant infectious history. Vaccinations are up to date.    Past Medical History: Patient Active Problem List   Diagnosis Date Noted  . Seasonal and perennial allergic rhinitis 11/06/2018  . Mild intermittent asthma,  uncomplicated 66/44/0347  . Flexural atopic dermatitis 11/06/2018  . History of hyperlipidemia 11/08/2016  . History of anxiety 11/08/2016  . History of retinal tear 11/08/2016  . History of Bilateral plantar fasciitis 08/10/2016  . High risk medication use 07/04/2016  . Lupus ([place]) 05/23/2016  . 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[place]) 03/18/2016  . ADHD (attention deficit hyperactivity disorder) 12/22/2014  . Raynaud's phenomenon 10/17/2013  . Hyperlipidemia 09/28/2011    Medication List:  Allergies as of 11/06/2018      Reactions   Avelox [moxifloxacin Hcl In Nacl] Other (See Comments)   Muscle cramps   Cefaclor Hives   Tolerates penicillins   Latex Other (See Comments)   "Raw skin"   Red Wine Complex [germanium]    Itching    Meloxicam Rash   Naproxen Rash      Medication List       Accurate as of November 06, 2018 10:47 AM. Always use your most recent med list.        acetaminophen 500 MG tablet Commonly known as:  TYLENOL Take 1,000 mg by mouth every 6 (six) hours as needed for mild pain.   albuterol  108 (90 Base) MCG/ACT inhaler Commonly known as:  PROVENTIL HFA;VENTOLIN HFA Inhale 2 puffs into the lungs every 6 (six) hours as needed for wheezing or shortness of breath.   amphetamine-dextroamphetamine 10 MG tablet Commonly known as:  ADDERALL Take 10 mg once a day to twice daily.   azelastine 0.1 % nasal spray Commonly known as:  ASTELIN PLACE 2 SPRAYS INTO BOTH NOSTRILS TWICE DAILY   BENADRYL ALLERGY PO Take by mouth as needed.   buPROPion 300 MG 24 hr tablet Commonly known as:  WELLBUTRIN XL   hydroxychloroquine 200 MG tablet Commonly known as:  PLAQUENIL TAKE 1 TABLET BY MOUTH TWICE DAILY MONDAY THROUGH FRIDAY ONLY   loratadine 10 MG tablet Commonly known as:  CLARITIN Take 10 mg by mouth daily.   Melatonin 5 MG Caps Take by mouth.   OMEGA-3-6-9 PO Take 2 capsules by mouth daily.   PROBIOTIC-10 PO Take by mouth.   SALMON OIL-1000 PO Take by mouth.     sodium chloride 0.65 % nasal spray Commonly known as:  OCEAN Place 1 spray into the nose daily as needed for congestion.   SUMAtriptan 50 MG tablet Commonly known as:  IMITREX Take 1 tablet (45m) at onset of migraine. Can repeat dose in 2 hours if needed. Do not exceed 2 dose in 24 hours.   VITAMIN D (CHOLECALCIFEROL) PO Take 500 mg by mouth.       Birth History: non-contributory  Developmental History: non-contributory.   Past Surgical History: Past Surgical History:  Procedure Laterality Date  . CESAREAN SECTION     x 2  . DENTAL SURGERY    . EYE SURGERY Bilateral    cryoretinopexy     Family History: Family History  Problem Relation Age of Onset  . Heart disease Mother 566      CAD/stenting  . Hyperlipidemia Mother   . Arthritis Mother        ostearthritis  . Hypertension Mother   . Kidney disease Mother   . Irritable bowel syndrome Mother   . Stroke Mother 880      CVA  . Fibromyalgia Mother   . Hyperlipidemia Father   . Neuropathy Father   . Allergies Father        shellfish  . Hyperlipidemia Sister   . Hypertension Brother   . Colon cancer Neg Hx   . Inflammatory bowel disease Neg Hx      Social History: [place] at home with her husband and two teenage kids.  They live in a house that is 962years old.  There is hardwood throughout the home.  They have gas heating and central cooling.  There is one cat in the home.  There are dust mite covers on the bed, but not the pillows.  There is no tobacco exposure.  He did smoke from 89through 1999.  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   Review of Systems  Constitutional: Negative.  Negative for fever, malaise/fatigue and weight loss.  HENT: Positive for congestion and sore throat. Negative for ear discharge and ear pain.        Positive for postnasal drip.  Eyes: Negative for pain, discharge and redness.   Respiratory: Negative for cough, sputum production, shortness of breath and wheezing.   Cardiovascular: Negative.  Negative for chest pain and palpitations.  Gastrointestinal: Negative for abdominal pain and heartburn.  Skin: Positive for  itching. Negative for rash.  Neurological: Negative for dizziness and headaches.       Positive for history of lupus.  Endo/Heme/Allergies: Negative for environmental allergies. Does not bruise/bleed easily.       Objective:   Blood pressure 98/60, pulse 77, temperature 98.1 F (36.7 C), temperature source Oral, resp. rate 16, height 5' 6"  (1.676 m), weight 155 lb (70.3 kg), SpO2 99 %. Body mass index is 25.02 kg/m.   Physical Exam:   Physical Exam  Constitutional: She appears well-developed.  HENT:  Head: Normocephalic and atraumatic.  Right Ear: Tympanic membrane, external ear and ear canal normal. No drainage, swelling or tenderness. Tympanic membrane is not injected, not scarred, not erythematous, not retracted and not bulging.  Left Ear: Tympanic membrane, external ear and ear canal normal. No drainage, swelling or tenderness. Tympanic membrane is not injected, not scarred, not erythematous, not retracted and not bulging.  Nose: No mucosal edema, rhinorrhea, nasal deformity or septal deviation. No epistaxis. Right sinus exhibits no maxillary sinus tenderness and no frontal sinus tenderness. Left sinus exhibits no maxillary sinus tenderness and no frontal sinus tenderness.  Mouth/Throat: Uvula is midline and oropharynx is clear and moist. Mucous membranes are not pale and not dry.  Moderate cobblestoning in the posterior oropharynx.  Eyes: Pupils are equal, round, and reactive to light. Conjunctivae and EOM are normal. Right eye exhibits no chemosis and no discharge. Left eye exhibits no chemosis and no discharge. Right conjunctiva is not injected. Left conjunctiva is not injected.  Cardiovascular: Normal rate, regular rhythm and normal heart  sounds.  Respiratory: Effort normal. No accessory muscle usage. No tachypnea. No respiratory distress. She has no decreased breath sounds. She has no wheezes. She has no rhonchi. She has no rales. She exhibits no tenderness.  GI: There is no abdominal tenderness. There is no rebound and no guarding.  Lymphadenopathy:       Head (right side): No submandibular, no tonsillar and no occipital adenopathy present.       Head (left side): No submandibular, no tonsillar and no occipital adenopathy present.    She has cervical adenopathy.  Shotty bilateral cervical lymphadenopathy.  Neurological: She is alert.  Skin: No abrasion, no petechiae and no rash noted. Rash is not papular, not vesicular and not urticarial. No erythema. No pallor.  There is some blanching flat erythematous lesions on the left side of the neck.  Psychiatric: She has a normal mood and affect.     Diagnostic studies:    Spirometry: results normal (FEV1: 2.63/84%, FVC: 3.42/88%, FEV1/FVC: 77%).    Spirometry consistent with normal pattern.   Allergy Studies:    Airborne Adult Perc - 11/06/18 0918    Time Antigen Placed  8341    Allergen Manufacturer  Lavella Hammock    Location  Back    Number of Test  59    Panel 1  Select    1. Control-Buffer 50% Glycerol  Negative    2. Control-Histamine 1 mg/ml  2+    3. Albumin saline  Negative    4. [place]  Negative    5. [place]  Negative    6. Johnson  Negative    7. [place] Blue  Negative    8. Meadow Fescue  Negative    9. Perennial Rye  Negative    10. Sweet Vernal  Negative    11. Timothy  Negative    12. Cocklebur  Negative    13. Burweed Marshelder  Negative  14. Ragweed, short  Negative    15. Ragweed, Giant  Negative    16. Plantain,  English  Negative    17. Lamb's Quarters  Negative    18. Sheep Sorrell  Negative    19. Rough Pigweed  Negative    20. Marsh Elder, Rough  Negative    21. Mugwort, Common  Negative    22. Ash mix  Negative    23. Birch mix  Negative     24. Beech American  Negative    25. Box, Elder  Negative    26. Cedar, red  Negative    27. Cottonwood, [place]  Negative    28. Elm mix  Negative    29. Hickory mix  Negative    30. Maple mix  Negative    31. Oak, [place] mix  Negative    32. Pecan Pollen  Negative    33. Pine mix  Negative    34. Sycamore Eastern  Negative    35. Clarke, Black Pollen  Negative    36. Alternaria alternata  Negative    37. Cladosporium Herbarum  Negative    38. Aspergillus mix  2+    39. Penicillium mix  Negative    40. Bipolaris sorokiniana (Helminthosporium)  Negative    41. Drechslera spicifera (Curvularia)  Negative    42. Mucor plumbeus  Negative    43. Fusarium moniliforme  Negative    44. Aureobasidium pullulans (pullulara)  Negative    45. Rhizopus oryzae  Negative    46. Botrytis cinera  2+    47. Epicoccum nigrum  Negative    48. Phoma betae  Negative    49. Candida Albicans  Negative    50. Trichophyton mentagrophytes  Negative    51. Mite, D Farinae  5,000 AU/ml  3+    52. Mite, D Pteronyssinus  5,000 AU/ml  4+    53. Cat Hair 10,000 BAU/ml  2+    54.  Dog Epithelia  Negative    55. Mixed Feathers  Negative    56. Horse Epithelia  Negative    57. Cockroach, German  Negative    58. Mouse  Negative    59. Tobacco Leaf  Negative    Comments  n     Intradermal - 11/06/18 1008    Time Antigen Placed  1008    Allergen Manufacturer  Lavella Hammock    Location  Arm    Number of Test  12    Intradermal  Select    Control  Negative    [place]  1+    Johnson  1+    7 Grass  Negative    Ragweed mix  Negative    Weed mix  Negative    Tree mix  Negative    Mold 1  Negative    Mold 3  Negative    Mold 4  1+    Dog  Negative    Cockroach  1+     Food Adult Perc - 11/06/18 0900    Time Antigen Placed  8882    Allergen Manufacturer  Lavella Hammock    Location  Back    Number of allergen test  3    Panel 2  Select    Control-Histamine 1 mg/ml  2+    57. Banana  Negative    63. Pineapple   Negative       Allergy testing results were read and interpreted by myself, documented by clinical staff.  Salvatore Marvel, MD [facility]

## 2018-11-06 NOTE — Patient Instructions (Addendum)
1. Chronic rhinitis - Testing today showed: grasses, indoor molds, outdoor molds, dust mites, cat and cockroach - Copy of test results provided.  - Avoidance measures provided. - Stop taking: Claritin - Continue with: Astelin (azelastine) 2 sprays per nostril 1-2 times daily as needed - Start taking: Allegra (fexofenadine) 161m table once daily and Singulair (montelukast) 12mdaily - You can use an extra dose of the antihistamine, if needed, for breakthrough symptoms.  - Consider nasal saline rinses 1-2 times daily to remove allergens from the nasal cavities as well as help with mucous clearance (this is especially helpful to do before the nasal sprays are given) - Consider allergy shots as a means of long-term control. - Allergy shots "re-train" and "reset" the immune system to ignore environmental allergens and decrease the resulting immune response to those allergens (sneezing, itchy watery eyes, runny nose, nasal congestion, etc).    - Allergy shots improve symptoms in 75-85% of patients.  - We can discuss more at the next appointment if the medications are not working for you.  2. Mild intermittent asthma, uncomplicated - Lung testing looked normal today. - I do not think that a controller medication is needed at this time. - Continue with albuterol four puffs every 4-6 hours as needed.  3. Adverse food reaction - Testing was negative to pineapple and banana. - The milk reaction is likely related to lactose intolerance.  - These could be related to either the acidity of the foods or something else, but the negative skin testing is reassuring.   4. Flexural atopic dermatitis - Continue with moisturizing as you are doing.   5. Return in about 3 months (around 02/04/2019).   Please inform usKoreaf any Emergency Department visits, hospitalizations, or changes in symptoms. Call usKoreaefore going to the ED for breathing or allergy symptoms since we might be able to fit you in for a sick  visit. Feel free to contact usKoreanytime with any questions, problems, or concerns.  It was a pleasure to meet you today!  Websites that have reliable patient information: 1. American Academy of Asthma, Allergy, and Immunology: www.aaaai.org 2. Food Allergy Research and Education (FARE): foodallergy.org 3. Mothers of Asthmatics: http://www.asthmacommunitynetwork.org 4. American College of Allergy, Asthma, and Immunology: wwMonthlyElectricBill.co.uk Make sure you are registered to vote! If you have moved or changed any of your contact information, you will need to get this updated before voting!    Voter ID laws are NOT going into effect for the General Election in November 2020! DO NOT let this stop you from exercising your right to vote!    Control of House Dust Mite Allergen    House dust mites play a major role in allergic asthma and rhinitis.  They occur in environments with high humidity wherever human skin, the food for dust mites is found. High levels have been detected in dust obtained from mattresses, pillows, carpets, upholstered furniture, bed covers, clothes and soft toys.  The principal allergen of the house dust mite is found in its feces.  A gram of dust may contain 1,000 mites and 250,000 fecal particles.  Mite antigen is easily measured in the air during house cleaning activities.    1. Encase mattresses, including the box spring, and pillow, in an air tight cover.  Seal the zipper end of the encased mattresses with wide adhesive tape. 2. Wash the bedding in water of 130 degrees Farenheit weekly.  Avoid cotton comforters/quilts and flannel bedding: the most ideal  bed covering is the dacron comforter. 3. Remove all upholstered furniture from the bedroom. 4. Remove carpets, carpet padding, rugs, and non-washable window drapes from the bedroom.  Wash drapes weekly or use plastic window coverings. 5. Remove all non-washable stuffed toys from the bedroom.  Wash stuffed toys  weekly. 6. Have the room cleaned frequently with a vacuum cleaner and a damp dust-mop.  The patient should not be in a room which is being cleaned and should wait 1 hour after cleaning before going into the room. 7. Close and seal all heating outlets in the bedroom.  Otherwise, the room will become filled with dust-laden air.  An electric heater can be used to heat the room. 8. Reduce indoor humidity to less than 50%.  Do not use a humidifier.  Control of Mold Allergen   Mold and fungi can grow on a variety of surfaces provided certain temperature and moisture conditions exist.  Outdoor molds grow on plants, decaying vegetation and soil.  The major outdoor mold, Alternaria and Cladosporium, are found in very high numbers during hot and dry conditions.  Generally, a late Summer - Fall peak is seen for common outdoor fungal spores.  Rain will temporarily lower outdoor mold spore count, but counts rise rapidly when the rainy period ends.  The most important indoor molds are Aspergillus and Penicillium.  Dark, humid and poorly ventilated basements are ideal sites for mold growth.  The next most common sites of mold growth are the bathroom and the kitchen.  Indoor (Perennial) Mold Control   Positive indoor molds via skin testing: Aspergillus, Fusarium, Aureobasidium (Pullulara), Rhizopus and Botrytis  1. Maintain humidity below 50%. 2. Clean washable surfaces with 5% bleach solution. 3. Remove sources e.g. contaminated carpets.    Control of Dog or Cat Allergen  Avoidance is the best way to manage a dog or cat allergy. If you have a dog or cat and are allergic to dog or cats, consider removing the dog or cat from the home. If you have a dog or cat but don't want to find it a new home, or if your family wants a pet even though someone in the household is allergic, here are some strategies that may help keep symptoms at bay:  1. Keep the pet out of your bedroom and restrict it to only a few rooms.  Be advised that keeping the dog or cat in only one room will not limit the allergens to that room. 2. Don't pet, hug or kiss the dog or cat; if you do, wash your hands with soap and water. 3. High-efficiency particulate air (HEPA) cleaners run continuously in a bedroom or living room can reduce allergen levels over time. 4. Regular use of a high-efficiency vacuum cleaner or a central vacuum can reduce allergen levels. 5. Giving your dog or cat a bath at least once a week can reduce airborne allergen.  Control of Cockroach Allergen  Cockroach allergen has been identified as an important cause of acute attacks of asthma, especially in urban settings.  There are fifty-five species of cockroach that exist in the [place], however only three, the [place], [place] species produce allergen that can affect patients with Asthma.  Allergens can be obtained from fecal particles, egg casings and secretions from cockroaches.    1. Remove food sources. 2. Reduce access to water. 3. Seal access and entry points. 4. Spray runways with 0.5-1% Diazinon or Chlorpyrifos 5. Blow boric acid power under stoves and refrigerator.  6. Place bait stations (hydramethylnon) at feeding sites.  Reducing Pollen Exposure  The American Academy of Allergy, Asthma and Immunology suggests the following steps to reduce your exposure to pollen during allergy seasons.    1. Do not hang sheets or clothing out to dry; pollen may collect on these items. 2. Do not mow lawns or spend time around freshly cut grass; mowing stirs up pollen. 3. Keep windows closed at night.  Keep car windows closed while driving. 4. Minimize morning activities outdoors, a time when pollen counts are usually at their highest. 5. Stay indoors as much as possible when pollen counts or humidity is high and on windy days when pollen tends to remain in the air longer. 6. Use air conditioning when possible.  Many air conditioners have filters  that trap the pollen spores. 7. Use a HEPA room air filter to remove pollen form the indoor air you breathe.   Allergy Shots   Allergies are the result of a chain reaction that starts in the immune system. Your immune system controls how your body defends itself. For instance, if you have an allergy to pollen, your immune system identifies pollen as an invader or allergen. Your immune system overreacts by producing antibodies called Immunoglobulin E (IgE). These antibodies travel to cells that release chemicals, causing an allergic reaction.  The concept behind allergy immunotherapy, whether it is received in the form of shots or tablets, is that the immune system can be desensitized to specific allergens that trigger allergy symptoms. Although it requires time and patience, the payback can be long-term relief.  How Do Allergy Shots Work?  Allergy shots work much like a vaccine. Your body responds to injected amounts of a particular allergen given in increasing doses, eventually developing a resistance and tolerance to it. Allergy shots can lead to decreased, minimal or no allergy symptoms.  There generally are two phases: build-up and maintenance. Build-up often ranges from three to six months and involves receiving injections with increasing amounts of the allergens. The shots are typically given once or twice a week, though more rapid build-up schedules are sometimes used.  The maintenance phase begins when the most effective dose is reached. This dose is different for each person, depending on how allergic you are and your response to the build-up injections. Once the maintenance dose is reached, there are longer periods between injections, typically two to four weeks.  Occasionally doctors give cortisone-type shots that can temporarily reduce allergy symptoms. These types of shots are different and should not be confused with allergy immunotherapy shots.  Who Can Be Treated with Allergy  Shots?  Allergy shots may be a good treatment approach for people with allergic rhinitis (hay fever), allergic asthma, conjunctivitis (eye allergy) or stinging insect allergy.   Before deciding to begin allergy shots, you should consider:  . The length of allergy season and the severity of your symptoms . Whether medications and/or changes to your environment can control your symptoms . Your desire to avoid long-term medication use . Time: allergy immunotherapy requires a major time commitment . Cost: may vary depending on your insurance coverage  Allergy shots for children age 43 and older are effective and often well tolerated. They might prevent the onset of new allergen sensitivities or the progression to asthma.  Allergy shots are not started on patients who are pregnant but can be continued on patients who become pregnant while receiving them. In some patients with other medical conditions or who take certain  common medications, allergy shots may be of risk. It is important to mention other medications you talk to your allergist.   When Will I Feel Better?  Some may experience decreased allergy symptoms during the build-up phase. For others, it may take as long as 12 months on the maintenance dose. If there is no improvement after a year of maintenance, your allergist will discuss other treatment options with you.  If you aren't responding to allergy shots, it may be because there is not enough dose of the allergen in your vaccine or there are missing allergens that were not identified during your allergy testing. Other reasons could be that there are high levels of the allergen in your environment or major exposure to non-allergic triggers like tobacco smoke.  What Is the Length of Treatment?  Once the maintenance dose is reached, allergy shots are generally continued for three to five years. The decision to stop should be discussed with your allergist at that time. Some people may  experience a permanent reduction of allergy symptoms. Others may relapse and a longer course of allergy shots can be considered.  What Are the Possible Reactions?  The two types of adverse reactions that can occur with allergy shots are local and systemic. Common local reactions include very mild redness and swelling at the injection site, which can happen immediately or several hours after. A systemic reaction, which is less common, affects the entire body or a particular body system. They are usually mild and typically respond quickly to medications. Signs include increased allergy symptoms such as sneezing, a stuffy nose or hives.  Rarely, a serious systemic reaction called anaphylaxis can develop. Symptoms include swelling in the throat, wheezing, a feeling of tightness in the chest, nausea or dizziness. Most serious systemic reactions develop within 30 minutes of allergy shots. This is why it is strongly recommended you wait in your doctor's office for 30 minutes after your injections. Your allergist is trained to watch for reactions, and his or her staff is trained and equipped with the proper medications to identify and treat them.  Who Should Administer Allergy Shots?  The preferred location for receiving shots is your prescribing allergist's office. Injections can sometimes be given at another facility where the physician and staff are trained to recognize and treat reactions, and have received instructions by your prescribing allergist.

## 2018-11-14 NOTE — Addendum Note (Signed)
Addended by: Valentina Shaggy on: 11/14/2018 10:59 PM   Modules accepted: Orders

## 2018-11-15 DIAGNOSIS — J302 Other seasonal allergic rhinitis: Secondary | ICD-10-CM | POA: Diagnosis not present

## 2018-11-15 NOTE — Progress Notes (Unsigned)
VIALS EXP 11-15-2019

## 2018-11-19 DIAGNOSIS — J309 Allergic rhinitis, unspecified: Secondary | ICD-10-CM | POA: Diagnosis not present

## 2018-11-26 ENCOUNTER — Ambulatory Visit: Payer: BC Managed Care – PPO

## 2018-11-30 ENCOUNTER — Encounter: Payer: BC Managed Care – PPO | Admitting: Obstetrics & Gynecology

## 2018-11-30 ENCOUNTER — Ambulatory Visit (INDEPENDENT_AMBULATORY_CARE_PROVIDER_SITE_OTHER): Payer: BC Managed Care – PPO | Admitting: Obstetrics & Gynecology

## 2018-11-30 DIAGNOSIS — R102 Pelvic and perineal pain: Secondary | ICD-10-CM

## 2018-11-30 DIAGNOSIS — N921 Excessive and frequent menstruation with irregular cycle: Secondary | ICD-10-CM

## 2018-11-30 DIAGNOSIS — D219 Benign neoplasm of connective and other soft tissue, unspecified: Secondary | ICD-10-CM

## 2018-11-30 MED ORDER — MISOPROSTOL 200 MCG PO TABS: ORAL_TABLET | ORAL | 0 refills | Status: DC

## 2018-11-30 NOTE — Progress Notes (Signed)
Patient ID: Robin [name], female   DOB: 1970/11/17, 48 y.o.   MRN: [id number]  No chief complaint on file. This is a telemedicine visit. The patient is at home and I am in the office. She is referred by Dr. Pamella Pert.  We spent 15 minutes talking on the phone. HPI Robin [name] is a 48 y.o. married P2 (17 and 61 yo kids) on the phone today for discussion about an u/s done recently that showed several fibroids. This [place] was ordered because her increased pelvic pain. Her periods are lasting up to 17 days over the last 5 months. They used to be regular but have become heavier and irregular over the last 5 months. A CT in 2017 did not show fibroids.   Both of her deliveries were via Cesarean. Her hbg was 12.9 on 10/04/18.  Past Medical History:  Diagnosis Date  . ADD (attention deficit disorder)   . Allergy   . Anemia   . Anxiety   . Hyperlipidemia   . Ileitis   . Lupus [facility])     Past Surgical History:  Procedure Laterality Date  . CESAREAN SECTION     x 2  . DENTAL SURGERY    . EYE SURGERY Bilateral    cryoretinopexy    Family History  Problem Relation Age of Onset  . Heart disease Mother 70       CAD/stenting  . Hyperlipidemia Mother   . Arthritis Mother        ostearthritis  . Hypertension Mother   . Kidney disease Mother   . Irritable bowel syndrome Mother   . Stroke Mother 50       CVA  . Fibromyalgia Mother   . Hyperlipidemia Father   . Neuropathy Father   . Allergies Father        shellfish  . Hyperlipidemia Sister   . Hypertension Brother   . Colon cancer Neg Hx   . Inflammatory bowel disease Neg Hx     Social History Social History   Tobacco Use  . Smoking status: Former Smoker    Packs/day: 0.75    Years: 10.00    Pack years: 7.50    Types: Cigarettes    Last attempt to quit: 07/05/2001    Years since quitting: 17.4  . Smokeless tobacco: Never Used  Substance Use Topics  . Alcohol use: Yes    Alcohol/week: 14.0 standard drinks    Types: 14  Cans of beer per week    Comment: 2 beer daily  . Drug use: Never    Allergies  Allergen Reactions  . Avelox [Moxifloxacin Hcl In Nacl] Other (See Comments)    Muscle cramps  . Cefaclor Hives    Tolerates penicillins  . Latex Other (See Comments)    "Raw skin"  . Red Wine Complex [Germanium]     Itching   . Meloxicam Rash  . Naproxen Rash    Current Outpatient Medications  Medication Sig Dispense Refill  . acetaminophen (TYLENOL) 500 MG tablet Take 1,000 mg by mouth every 6 (six) hours as needed for mild pain.    Marland Kitchen albuterol (PROVENTIL HFA;VENTOLIN HFA) 108 (90 Base) MCG/ACT inhaler Inhale 2 puffs into the lungs every 6 (six) hours as needed for wheezing or shortness of breath. 1 Inhaler 0  . amphetamine-dextroamphetamine (ADDERALL) 10 MG tablet Take 10 mg once a day to twice daily. (Patient taking differently: 2 (two) times daily with a meal. Take 10 mg once a day to  twice daily.) 60 tablet 0  . azelastine (ASTELIN) 0.1 % nasal spray PLACE 2 SPRAYS INTO BOTH NOSTRILS TWICE DAILY (Patient taking differently: as needed. ) 30 mL 0  . buPROPion (WELLBUTRIN XL) 300 MG 24 hr tablet     . DiphenhydrAMINE HCl (BENADRYL ALLERGY PO) Take by mouth as needed.    . hydroxychloroquine (PLAQUENIL) 200 MG tablet TAKE 1 TABLET BY MOUTH TWICE DAILY MONDAY THROUGH FRIDAY ONLY 120 tablet 0  . loratadine (CLARITIN) 10 MG tablet Take 10 mg by mouth daily.    . Melatonin 5 MG CAPS Take by mouth.    . montelukast (SINGULAIR) 10 MG tablet Take 1 tablet (10 mg total) by mouth at bedtime. 30 tablet 5  . Omega 3-6-9 Fatty Acids (OMEGA-3-6-9 PO) Take 2 capsules by mouth daily.    . Omega-3 Fatty Acids (SALMON OIL-1000 PO) Take by mouth.    . Probiotic Product (PROBIOTIC-10 PO) Take by mouth.    . sodium chloride (OCEAN) 0.65 % nasal spray Place 1 spray into the nose daily as needed for congestion.     . SUMAtriptan (IMITREX) 50 MG tablet Take 1 tablet (82m) at onset of migraine. Can repeat dose in 2 hours if  needed. Do not exceed 2 dose in 24 hours. 10 tablet 0  . VITAMIN D, CHOLECALCIFEROL, PO Take 500 mg by mouth.     Current Facility-Administered Medications  Medication Dose Route Frequency Provider Last Rate Last Dose  . magic mouthwash  5 mL Oral TID DBo Merino MD        Review of Systems Review of Systems She is a cOptometrist developing organizational development. She uses condoms for contraception, although he had a [place] scheduled but [place]   There were no vitals taken for this visit.  Physical Exam Physical Exam Not done Data Reviewed Labs, CT , u[place] Assessment Symptomatic fibroids  Plan EMBX needed Cytotec prescribed We discussed Minerva ablation versus hysterectomy She is leaning towards hysterectomy since I told her that an ablation would not fix her pelvic pain.  MEmily Filbert3/20/2020, 9:04 AM

## 2019-01-07 ENCOUNTER — Ambulatory Visit: Payer: Self-pay

## 2019-01-27 ENCOUNTER — Other Ambulatory Visit: Payer: Self-pay | Admitting: Physician Assistant

## 2019-01-27 DIAGNOSIS — M3219 Other organ or system involvement in systemic lupus erythematosus: Secondary | ICD-10-CM

## 2019-01-28 NOTE — Progress Notes (Signed)
Office Visit Note  Patient: Robin [name]             Date of Birth: 1970-11-18           MRN: [id number]             PCP: Rutherford Guys, MD Referring: Rutherford Guys, MD Visit Date: 02/08/2019 Occupation: @GUAROCC @  This service was conducted via virtual visit.  Both audio and visual tools were used.  The patient was located at home. I was located in my office.  Consent was obtained prior to the virtual visit and is aware of possible charges through their insurance for this visit.  The patient is an established patient.  Dr. Estanislado Pandy, MD conducted the virtual visit and Hazel Sams, PA-C acted as scribe during the service.  Office staff helped with scheduling follow up visits after the service was conducted.     Subjective:  Muscle tenderness   History of Present Illness: Robin [name] is a 48 y.o. female with history of systemic lupus erythematosus and Raynaud's.  She is taking Plaquenil 200 mg 1 tablet by mouth BID Monday-Friday.  She denies any recent lupus flares.  She has been running 3 times a week for 1 month and strength training twice weekly.  She reports she experiences muscle tenderness and soreness despite feeling like she is in good shape.  She denies any joint swelling.  She has recurrent nasal ulcerations but no oral ulcerations. She denies any sicca symptoms. She denies any recent rashes.  She wears sunscreen daily.  She reports her level of fatigue is stable.  She denies any shortness of breath or palpitations.   Activities of Daily Living:  Patient reports morning stiffness for 30-60  minutes.   Patient Denies nocturnal pain.  Difficulty dressing/grooming: Denies Difficulty climbing stairs: Denies Difficulty getting out of chair: Denies Difficulty using hands for taps, buttons, cutlery, and/or writing: Denies  Review of Systems  Constitutional: Positive for fatigue. Night sweats:    HENT: Positive for nose dryness (Nasal ulcerations intermittently).  Negative for mouth sores and mouth dryness.   Eyes: Negative for pain, visual disturbance and dryness.  Respiratory: Negative for cough, hemoptysis, shortness of breath and difficulty breathing.   Cardiovascular: Negative for chest pain, palpitations, hypertension and swelling in legs/feet.  Gastrointestinal: Negative for blood in stool, constipation and diarrhea.  Endocrine: Negative for increased urination.  Genitourinary: Negative for painful urination.  Musculoskeletal: Positive for morning stiffness and muscle tenderness. Negative for arthralgias, joint pain, joint swelling, myalgias, muscle weakness and myalgias.  Skin: Negative for color change, pallor, rash, hair loss, nodules/bumps, skin tightness, ulcers and sensitivity to sunlight.  Allergic/Immunologic: Negative for susceptible to infections.  Neurological: Negative for dizziness, numbness, headaches and weakness. Night sweats:    Hematological: Negative for swollen glands.  Psychiatric/Behavioral: Negative for depressed mood and sleep disturbance. The patient is not nervous/anxious.     PMFS History:  Patient Active Problem List   Diagnosis Date Noted   Seasonal and perennial allergic rhinitis 11/06/2018   Mild intermittent asthma, uncomplicated 32/35/5732   Flexural atopic dermatitis 11/06/2018   History of hyperlipidemia 11/08/2016   History of anxiety 11/08/2016   History of retinal tear 11/08/2016   History of Bilateral plantar fasciitis 08/10/2016   High risk medication use 07/04/2016   Lupus ([place]) 05/23/2016   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[place]) 03/18/2016   ADHD (attention deficit hyperactivity disorder) 12/22/2014   Raynaud's phenomenon 10/17/2013   Hyperlipidemia 09/28/2011    Past  Medical History:  Diagnosis Date   ADD (attention deficit disorder)    Allergy    Anemia    Anxiety    Hyperlipidemia    Ileitis    Lupus ([place])     Family History  Problem Relation Age of Onset   Heart disease  Mother 39       CAD/stenting   Hyperlipidemia Mother    Arthritis Mother        ostearthritis   Hypertension Mother    Kidney disease Mother    Irritable bowel syndrome Mother    Stroke Mother 44       CVA   Fibromyalgia Mother    Hyperlipidemia Father    Neuropathy Father    Allergies Father        shellfish   Hyperlipidemia Sister    Hypertension Brother    Colon cancer Neg Hx    Inflammatory bowel disease Neg Hx    Past Surgical History:  Procedure Laterality Date   CESAREAN SECTION     x 2   DENTAL SURGERY     EYE SURGERY Bilateral    cryoretinopexy   Social History   Social History Narrative   Marital status: married x 17 years; happily married      Children: 2 children (63, 49)      Lives: with husband, 2 children.      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  HENT:  Head: Normocephalic and atraumatic.  Eyes: Conjunctivae are normal.  Pulmonary/Chest: Effort normal.  Neurological: She is alert and oriented to person, place, and time.  Psychiatric: Mood, memory, affect and judgment normal.   Musculoskeletal Exam:C-spine good ROM with no discomfort.   Complete fist formation bilaterally.  No joint swelling.  No malar rash noted. No digital ulcerations or signs of gangrene.   Imaging: No results found.  Recent Labs: Lab Results  Component Value Date   WBC 5.7 10/04/2018   HGB 12.9 10/04/2018   PLT 241 08/31/2018   NA 137  08/31/2018   K 4.1 08/31/2018   CL 103 08/31/2018   CO2 27 08/31/2018   GLUCOSE 81 08/31/2018   BUN 12 08/31/2018   CREATININE 0.91 08/31/2018   BILITOT 0.6 08/31/2018   ALKPHOS 27 (L) 04/19/2017   AST 20 08/31/2018   ALT 11 08/31/2018   PROT 7.3 08/31/2018   ALBUMIN 4.8 04/19/2017   CALCIUM 9.5 08/31/2018   GFRAA 88 08/31/2018   September 08, 2018 vitamin D 37, ESR 2, C3 79, C4 normal, dsDNA negative, beta-2 negative Speciality Comments: PLQ Eye Exam: 02/16/18 WNL Follow up in 1 year  Procedures:  No procedures performed Allergies: Avelox [moxifloxacin hcl in nacl]; Cefaclor; Latex; Red wine complex [germanium]; Meloxicam; and Naproxen     Assessment / Plan:     Visit Diagnoses: Other systemic lupus erythematosus with other organ involvement ([place]) -She has not had any recent lupus flares. She is clinically doing well on Plaquenil 200  mg 1 tablet by mouth twice daily Monday through Friday.  She has no joint pain or joint swelling.  She continues to have morning stiffness lasting about 30-60 minutes.  She has been staying active by running 3 times weekly and strength training 2 days weekly.  She has been experiencing muscle tenderness but no muscle weakness.  Her level of fatigue has been stable.  She has not had any recent fevers or swollen lymph nodes.  She has recurrent nasal ulcerations but no oral ulcerations.  She denies any sicca symptoms.  She has intermittent symptoms of Raynaud's but no digital ulcerations or signs or gangrene.  She has not had any recent rashes.  She wears sunscreen daily.  She will continue on Plaquenil 200 mg 1 tablet BID M-F. She does not need any refills.  We will place future orders for autoimmune lab work.  September 08, 2018 vitamin D 37, ESR 2, C3 79, C4 normal, dsDNA negative, beta-2 negativeShe was advised to notify us if she develops any new or worsening symptoms.  She will follow up in 3 months.  Plan: CBC with Differential/Platelet, COMPLETE METABOLIC  PANEL WITH GFR, Urinalysis, Routine w reflex microscopic, Anti-DNA antibody, double-stranded, C3 and C4, Sedimentation rate  High risk medication use -PLQ 200 mg 1 tablet po daily M-F.PLQ Eye Exam: 02/16/18 WNL Follow up in 1 year Plan: CBC with Differential/Platelet, COMPLETE METABOLIC PANEL WITH GFR  Raynaud's phenomenon without gangrene: She has intermittent symptoms of Raynaud's.  She has no digital ulcerations or signs of gangrene.    Erythema nodosum: She has not had any recurrences.   Other fatigue: Stable.  Plantar fasciitis: Resolved.   Other medical conditions are listed as follows:   Terminal ileitis without complication ([place])  History of retinal tear  History of hyperlipidemia  History of anxiety  Attention deficit hyperactivity disorder (ADHD), predominantly inattentive type  Word finding difficulty   Orders: Orders Placed This Encounter  Procedures   CBC with Differential/Platelet   COMPLETE METABOLIC PANEL WITH GFR   Urinalysis, Routine w reflex microscopic   Anti-DNA antibody, double-stranded   C3 and C4   Sedimentation rate   No orders of the defined types were placed in this encounter.   Face-to-face time spent with patient was 25 minutes. Greater than 50% of time was spent in counseling and coordination of care.  Follow-Up Instructions: She will follow up in 3 months.   Bo Merino, MD   Scribed by- Ofilia Neas, PA-C  Note - This record has been created using Dragon software.  Chart creation errors have been sought, but may not always  have been located. Such creation errors do not reflect on  the standard of medical care.

## 2019-01-28 NOTE — Telephone Encounter (Signed)
Please schedule patient for a follow up visit. Patient due May 2020 Thanks!

## 2019-01-28 NOTE — Telephone Encounter (Signed)
Last Visit: 08/31/18 Next Visit du May 2020. Message sent to the front to schedule patient,  Labs: 09/01/19 CBC WNL. Alk phos low but stable PLQ Eye Exam: 02/16/18 WNL   Okay to refill per Dr. Estanislado Pandy

## 2019-02-06 ENCOUNTER — Ambulatory Visit: Payer: Self-pay

## 2019-02-08 ENCOUNTER — Telehealth (INDEPENDENT_AMBULATORY_CARE_PROVIDER_SITE_OTHER): Payer: BC Managed Care – PPO | Admitting: Rheumatology

## 2019-02-08 ENCOUNTER — Encounter: Payer: Self-pay | Admitting: Rheumatology

## 2019-02-08 DIAGNOSIS — Z8669 Personal history of other diseases of the nervous system and sense organs: Secondary | ICD-10-CM

## 2019-02-08 DIAGNOSIS — I7300 Raynaud's syndrome without gangrene: Secondary | ICD-10-CM | POA: Diagnosis not present

## 2019-02-08 DIAGNOSIS — M3219 Other organ or system involvement in systemic lupus erythematosus: Secondary | ICD-10-CM | POA: Diagnosis not present

## 2019-02-08 DIAGNOSIS — R4789 Other speech disturbances: Secondary | ICD-10-CM

## 2019-02-08 DIAGNOSIS — F900 Attention-deficit hyperactivity disorder, predominantly inattentive type: Secondary | ICD-10-CM

## 2019-02-08 DIAGNOSIS — M722 Plantar fascial fibromatosis: Secondary | ICD-10-CM

## 2019-02-08 DIAGNOSIS — Z8639 Personal history of other endocrine, nutritional and metabolic disease: Secondary | ICD-10-CM

## 2019-02-08 DIAGNOSIS — K5000 Crohn's disease of small intestine without complications: Secondary | ICD-10-CM

## 2019-02-08 DIAGNOSIS — Z79899 Other long term (current) drug therapy: Secondary | ICD-10-CM

## 2019-02-08 DIAGNOSIS — Z8659 Personal history of other mental and behavioral disorders: Secondary | ICD-10-CM

## 2019-02-08 DIAGNOSIS — L52 Erythema nodosum: Secondary | ICD-10-CM | POA: Diagnosis not present

## 2019-02-08 DIAGNOSIS — R5383 Other fatigue: Secondary | ICD-10-CM

## 2019-05-17 ENCOUNTER — Other Ambulatory Visit: Payer: Self-pay

## 2019-05-17 DIAGNOSIS — Z79899 Other long term (current) drug therapy: Secondary | ICD-10-CM

## 2019-05-17 DIAGNOSIS — M3219 Other organ or system involvement in systemic lupus erythematosus: Secondary | ICD-10-CM

## 2019-05-21 DIAGNOSIS — F4010 Social phobia, unspecified: Secondary | ICD-10-CM | POA: Diagnosis not present

## 2019-05-21 DIAGNOSIS — F410 Panic disorder [episodic paroxysmal anxiety] without agoraphobia: Secondary | ICD-10-CM | POA: Diagnosis not present

## 2019-05-21 DIAGNOSIS — F411 Generalized anxiety disorder: Secondary | ICD-10-CM | POA: Diagnosis not present

## 2019-05-21 DIAGNOSIS — F3340 Major depressive disorder, recurrent, in remission, unspecified: Secondary | ICD-10-CM | POA: Diagnosis not present

## 2019-05-21 LAB — CBC WITH DIFFERENTIAL/PLATELET
Absolute Monocytes: 487 cells/uL (ref 200–950)
Basophils Absolute: 29 cells/uL (ref 0–200)
Basophils Relative: 0.7 %
Eosinophils Absolute: 109 cells/uL (ref 15–500)
Eosinophils Relative: 2.6 %
HCT: 39.8 % (ref 35.0–45.0)
Hemoglobin: 12.9 g/dL (ref 11.7–15.5)
Lymphs Abs: 1344 cells/uL (ref 850–3900)
MCH: 30.5 pg (ref 27.0–33.0)
MCHC: 32.4 g/dL (ref 32.0–36.0)
MCV: 94.1 fL (ref 80.0–100.0)
MPV: 10.2 fL (ref 7.5–12.5)
Monocytes Relative: 11.6 %
Neutro Abs: 2230 cells/uL (ref 1500–7800)
Neutrophils Relative %: 53.1 %
Platelets: 266 10*3/uL (ref 140–400)
RBC: 4.23 10*6/uL (ref 3.80–5.10)
RDW: 13.5 % (ref 11.0–15.0)
Total Lymphocyte: 32 %
WBC: 4.2 10*3/uL (ref 3.8–10.8)

## 2019-05-21 LAB — URINALYSIS, ROUTINE W REFLEX MICROSCOPIC
Bilirubin Urine: NEGATIVE
Glucose, UA: NEGATIVE
Hgb urine dipstick: NEGATIVE
Ketones, ur: NEGATIVE
Leukocytes,Ua: NEGATIVE
Nitrite: NEGATIVE
Protein, ur: NEGATIVE
Specific Gravity, Urine: 1.004 (ref 1.001–1.03)
pH: 8 (ref 5.0–8.0)

## 2019-05-21 LAB — COMPLETE METABOLIC PANEL WITH GFR
AG Ratio: 1.9 (calc) (ref 1.0–2.5)
ALT: 11 U/L (ref 6–29)
AST: 18 U/L (ref 10–35)
Albumin: 4.4 g/dL (ref 3.6–5.1)
Alkaline phosphatase (APISO): 24 U/L — ABNORMAL LOW (ref 31–125)
BUN: 13 mg/dL (ref 7–25)
CO2: 27 mmol/L (ref 20–32)
Calcium: 9.3 mg/dL (ref 8.6–10.2)
Chloride: 104 mmol/L (ref 98–110)
Creat: 0.88 mg/dL (ref 0.50–1.10)
GFR, Est African American: 91 mL/min/{1.73_m2} (ref >=60)
GFR, Est Non African American: 79 mL/min/{1.73_m2} (ref >=60)
Globulin: 2.3 g/dL (calc) (ref 1.9–3.7)
Glucose, Bld: 81 mg/dL (ref 65–99)
Potassium: 4.2 mmol/L (ref 3.5–5.3)
Sodium: 138 mmol/L (ref 135–146)
Total Bilirubin: 0.5 mg/dL (ref 0.2–1.2)
Total Protein: 6.7 g/dL (ref 6.1–8.1)

## 2019-05-21 LAB — SEDIMENTATION RATE: Sed Rate: 2 mm/h (ref 0–20)

## 2019-05-21 LAB — C3 AND C4
C3 Complement: 76 mg/dL — ABNORMAL LOW (ref 83–193)
C4 Complement: 20 mg/dL (ref 15–57)

## 2019-05-21 LAB — ANTI-DNA ANTIBODY, DOUBLE-STRANDED: ds DNA Ab: 1 IU/mL

## 2019-05-21 NOTE — Progress Notes (Signed)
Patient does not have a follow up visit scheduled. Please call to schedule follow up visit.  C3 low and trending down-76. C4 WNL.  Sed rate WNL.  DsDNA negative.  UA normal.  CBC WNL.  Alk phos low. We will continue to monitor.  Rest of CMP WNL.

## 2019-05-27 NOTE — Progress Notes (Signed)
Office Visit Note  Patient: Robin [name]             Date of Birth: [birth year]           MRN: [id number]             PCP: Rutherford Guys, MD Referring: Rutherford Guys, MD Visit Date: 05/30/2019 Occupation: @GUAROCC @  Subjective:  Right hand paresthesia    History of Present Illness: Robin [name] is a 48 y.o. female with history of systemic lupus erythematosus benign on syndrome.  Patient is taking Plaquenil 200 mg 1 tablet in the morning and half tablet in the evening.  She states that she changed to this dosing regimen about 2 months ago due to missing doses while taking it twice daily Monday through Friday.  She states that prior to her labs on 05/17/2019 she had missed a few doses of Plaquenil due to running out of her prescription.  She denies any recent signs or symptoms of a flare.  She denies any recent rashes.  She continues to have photosensitivity and tries to avoid any sun exposure.  She denies any hair loss.  She continues have symptoms of Raynaud's but denies any digital ulcerations or signs of gangrene.  She denies any worsening fatigue, fever, or swollen lymph nodes.  She continues to have chronic sicca symptoms.  She tries to increase her fluid intake and uses eyedrops.  She is occasional nasal ulcerations but denies any oral or nasal ulcerations recently.  She has pain in both knee joints.  She denies any joint swelling.  She states that she is try to stay active and is running on a regular basis.  She has been experiencing right carpal tunnel symptoms.  She has been experiencing nocturnal pain and has had some difficulty with ADLs.  She states she has tried wearing a night splint but feels as though it makes her symptoms worse.  She has intermittent right shoulder joint pain.  She has been to physical therapy in the past and perform the stretching exercises at home.  She denies any shortness of breath or palpitations.      Activities of Daily Living:  Patient  reports morning stiffness for 30-60  minutes.   Patient Reports nocturnal pain.  Difficulty dressing/grooming: Denies Difficulty climbing stairs: Denies Difficulty getting out of chair: Denies Difficulty using hands for taps, buttons, cutlery, and/or writing: Reports  Review of Systems  Constitutional: Negative for fatigue.  HENT: Positive for mouth dryness and nose dryness. Negative for mouth sores.   Eyes: Positive for dryness. Negative for pain and visual disturbance.  Respiratory: Negative for cough, hemoptysis, shortness of breath and difficulty breathing.   Cardiovascular: Negative for chest pain, palpitations, hypertension and swelling in legs/feet.  Gastrointestinal: Negative for blood in stool, constipation and diarrhea.  Endocrine: Negative for increased urination.  Genitourinary: Negative for painful urination.  Musculoskeletal: Positive for arthralgias, joint pain and morning stiffness. Negative for joint swelling, myalgias, muscle weakness, muscle tenderness and myalgias.  Skin: Positive for color change. Negative for pallor, rash, hair loss, nodules/bumps, skin tightness, ulcers and sensitivity to sunlight.  Allergic/Immunologic: Negative for susceptible to infections.  Neurological: Negative for dizziness, numbness, headaches and weakness.  Hematological: Negative for swollen glands.  Psychiatric/Behavioral: Negative for depressed mood and sleep disturbance. The patient is not nervous/anxious.     PMFS History:  Patient Active Problem List   Diagnosis Date Noted   Seasonal and perennial allergic rhinitis 11/06/2018   Mild  intermittent asthma, uncomplicated 20/25/4270   Flexural atopic dermatitis 11/06/2018   History of hyperlipidemia 11/08/2016   History of anxiety 11/08/2016   History of retinal tear 11/08/2016   History of Bilateral plantar fasciitis 08/10/2016   High risk medication use 07/04/2016   Lupus (Robin [name]) 05/23/2016   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[place])  03/18/2016   ADHD (attention deficit hyperactivity disorder) 12/22/2014   Raynaud's phenomenon 10/17/2013   Hyperlipidemia 09/28/2011    Past Medical History:  Diagnosis Date   ADD (attention deficit disorder)    Allergy    Anemia    Anxiety    Hyperlipidemia    Ileitis    Lupus (Robin [name])     Family History  Problem Relation Age of Onset   Heart disease Mother 88       CAD/stenting   Hyperlipidemia Mother    Arthritis Mother        ostearthritis   Hypertension Mother    Kidney disease Mother    Irritable bowel syndrome Mother    Stroke Mother 20       CVA   Fibromyalgia Mother    Hyperlipidemia Father    Neuropathy Father    Allergies Father        shellfish   Hyperlipidemia Sister    Hypertension Brother    Colon cancer Neg Hx    Inflammatory bowel disease Neg Hx    Past Surgical History:  Procedure Laterality Date   CESAREAN SECTION     x 2   DENTAL SURGERY     EYE SURGERY Bilateral    cryoretinopexy   Social History   Social History Narrative   Marital status: married x 17 years; happily married      Children: 2 children (1, 37)      Lives: with husband, 2 children.      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  Constitutional:      Appearance: She is well-developed.  HENT:     Head: Normocephalic and  atraumatic.  Eyes:     Conjunctiva/sclera: Conjunctivae normal.  Neck:     Musculoskeletal: Normal range of motion.  Cardiovascular:     Rate and Rhythm: Normal rate and regular rhythm.     Heart sounds: Normal heart sounds.  Pulmonary:     Effort: Pulmonary effort is normal.     Breath sounds: Normal breath sounds.  Abdominal:     General: Bowel sounds are normal.     Palpations: Abdomen is soft.  Lymphadenopathy:     Cervical: No cervical adenopathy.  Skin:    General: Skin is warm and dry.     Capillary Refill: Capillary refill takes 2 to 3 seconds.  Neurological:     Mental Status: She is alert  and oriented to person, place, and time.  Psychiatric:        Behavior: Behavior normal.      Musculoskeletal Exam: C-spine, thoracic spine, lumbar spine good range of motion.  No midline spinal tenderness.  No SI joint tenderness.  Shoulder joints, elbow joints, wrist joints, MCPs, PIPs and DIPs good range of motion no synovitis.  She has complete fist formation bilaterally.  Hip joints, knee joints, ankle joints, MTPs, PIPs, DIPs good range of motion no synovitis.  No warmth or effusion of bilateral knee joints.  No tenderness or swelling of ankle joints.  She has hypermobility noted on exam.   CDAI Exam: CDAI Score: -- Patient Global: --; Provider Global: -- Swollen: --; Tender: -- Joint Exam   No joint exam has been documented for this visit   There is currently no information documented on the homunculus. Go to the Rheumatology activity and complete the homunculus joint exam.  Investigation: No additional findings.  Imaging: No results found.  Recent Labs: Lab Results  Component Value Date   WBC 4.2 05/17/2019   HGB 12.9 05/17/2019   PLT 266 05/17/2019   NA 138 05/17/2019   K 4.2 05/17/2019   CL 104 05/17/2019   CO2 27 05/17/2019   GLUCOSE 81 05/17/2019   BUN 13 05/17/2019   CREATININE 0.88 05/17/2019   BILITOT 0.5 05/17/2019   ALKPHOS 27 (L) 04/19/2017   AST  18 05/17/2019   ALT 11 05/17/2019   PROT 6.7 05/17/2019   ALBUMIN 4.8 04/19/2017   CALCIUM 9.3 05/17/2019   GFRAA 91 05/17/2019    Speciality Comments: PLQ Eye Exam: 02/16/18 WNL Follow up in 1 year  Procedures:  No procedures performed Allergies: Avelox [moxifloxacin hcl in nacl], Cefaclor, Latex, Red wine complex [germanium], Meloxicam, and Naproxen   Assessment / Plan:     Visit Diagnoses: Other systemic lupus erythematosus with other organ involvement ([facility]) - Low C3, dsDNA 1: She has not had any signs or symptoms of a lupus flare recently.  She is clinically doing well on Plaquenil 200 mg 1 tablet in the morning and half tablet in the evening.  She was previously taking Plaquenil 200 mg 1 tablet twice daily Monday through Friday but was missing several doses per week so she switched to the current dosing.  She has no synovitis on exam.  She has occasional arthralgias in bilateral knee joints as well as the right shoulder.  She has good range of motion on exam with no warmth or effusion.  She remains very active and has been running for exercise.  She has not had any recent rashes.  No malar rash noted.  She continues to photosensitivity and tries to avoid sun exposure.  We discussed the importance of wearing SPF 50 on a daily basis.  She has not had any recent hair loss.  She continues to have intermittent symptoms of Raynaud's but no digital ulcerations or signs of gangrene were noted.  She has chronic sicca symptoms and has tried to increase her fluid intake.  She has occasional nasal ulcerations but they have been less frequent recently.  She has not had any worsening fatigue, swollen lymph nodes, or fevers.  She denies any shortness of breath or palpitations.  She will continue taking Plaquenil 200 mg 1 tablet in the morning and half tablet in the evening.  A refill was sent to the pharmacy today.  Autoimmune lab work obtained on 05/17/2019 revealed low C3, sed rate of 6, and  double-stranded DNA  1.  We will recheck labs in 3 months.   She is advised to notify us if she develops any new or worsening symptoms.  She will follow-up in the office in 3 months.  High risk medication use - Plaquenil 200 mg 1 tablet in the morning and 1/2 tablet in the evening.  Last Plaquenil eye exam normal on 02/16/2018.  She will schedule an update eye exam. Most recent CBC/CMP within normal limits on 05/17/2019.  Raynaud's phenomenon without gangrene: She has intermittent symptoms of Raynaud's.  She has delayed capillary refill to 2 to 3 seconds.  No digital ulcerations or signs of gangrene were noted.  We discussed the importance of wearing gloves and thick socks.  We also discussed the importance of keeping her core body temperature warm and drinking warm liquids.  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   Other fatigue: She has not experienced any worsening fatigue recently.  No fevers or swollen lymph nodes.  She remains very active and has been running for exercise.  Plantar fasciitis: Resolved   Right hand paresthesia: She has been experiencing right hand paresthesias for the past 2-3 months. She has been experiencing nocturnal pain and paresthesia.  She has tried wearing a carpal tunnel night splint, but she states the splint made her symptoms worse.  A referral for nerve conduction study will be placed today.  We will further discuss treatment options once the results have returned.  Other medical conditions are listed as follows:   Terminal ileitis without complication ([place])  History of retinal tear  History of hyperlipidemia  History of anxiety  Attention deficit hyperactivity disorder (ADHD), predominantly inattentive type    Orders: Orders Placed This Encounter  Procedures   Ambulatory referral to Physical Medicine Rehab   Meds ordered this encounter  Medications   hydroxychloroquine (PLAQUENIL) 200 MG tablet    Sig: Take 1 tablet (200 mg total) by mouth in the morning and half tablet (100  mg total) by mouth in the evening.    Dispense:  135 tablet    Refill:  0    Face-to-face time spent with patient was 30 minutes. Greater than 50% of time was spent in counseling and coordination of care.  Follow-Up Instructions: Return in about 3 months (around 08/29/2019) for Systemic lupus erythematosus.   Ofilia Neas, PA-C  Note - This record has been created using Dragon software.  Chart creation errors have been sought, but may not always  have been located. Such creation errors do not reflect on  the standard of medical care.

## 2019-05-30 ENCOUNTER — Other Ambulatory Visit: Payer: Self-pay

## 2019-05-30 ENCOUNTER — Encounter: Payer: Self-pay | Admitting: Physician Assistant

## 2019-05-30 ENCOUNTER — Ambulatory Visit (INDEPENDENT_AMBULATORY_CARE_PROVIDER_SITE_OTHER): Payer: BC Managed Care – PPO | Admitting: Physician Assistant

## 2019-05-30 VITALS — BP 117/75 | HR 82 | Resp 13 | Ht 66.0 in | Wt 156.0 lb

## 2019-05-30 DIAGNOSIS — R5383 Other fatigue: Secondary | ICD-10-CM

## 2019-05-30 DIAGNOSIS — L52 Erythema nodosum: Secondary | ICD-10-CM | POA: Diagnosis not present

## 2019-05-30 DIAGNOSIS — Z79899 Other long term (current) drug therapy: Secondary | ICD-10-CM | POA: Diagnosis not present

## 2019-05-30 DIAGNOSIS — Z8659 Personal history of other mental and behavioral disorders: Secondary | ICD-10-CM

## 2019-05-30 DIAGNOSIS — I7300 Raynaud's syndrome without gangrene: Secondary | ICD-10-CM | POA: Diagnosis not present

## 2019-05-30 DIAGNOSIS — M3219 Other organ or system involvement in systemic lupus erythematosus: Secondary | ICD-10-CM

## 2019-05-30 DIAGNOSIS — F900 Attention-deficit hyperactivity disorder, predominantly inattentive type: Secondary | ICD-10-CM

## 2019-05-30 DIAGNOSIS — K5000 Crohn's disease of small intestine without complications: Secondary | ICD-10-CM

## 2019-05-30 DIAGNOSIS — Z8669 Personal history of other diseases of the nervous system and sense organs: Secondary | ICD-10-CM

## 2019-05-30 DIAGNOSIS — M722 Plantar fascial fibromatosis: Secondary | ICD-10-CM

## 2019-05-30 DIAGNOSIS — Z8639 Personal history of other endocrine, nutritional and metabolic disease: Secondary | ICD-10-CM

## 2019-05-30 DIAGNOSIS — R202 Paresthesia of skin: Secondary | ICD-10-CM

## 2019-05-30 MED ORDER — HYDROXYCHLOROQUINE SULFATE 200 MG PO TABS: ORAL_TABLET | ORAL | 0 refills | Status: DC

## 2019-06-14 ENCOUNTER — Encounter: Payer: Self-pay | Admitting: Physical Medicine and Rehabilitation

## 2019-06-14 ENCOUNTER — Other Ambulatory Visit: Payer: Self-pay

## 2019-06-14 ENCOUNTER — Ambulatory Visit (INDEPENDENT_AMBULATORY_CARE_PROVIDER_SITE_OTHER): Payer: BC Managed Care – PPO | Admitting: Physical Medicine and Rehabilitation

## 2019-06-14 DIAGNOSIS — R202 Paresthesia of skin: Secondary | ICD-10-CM

## 2019-06-14 NOTE — Progress Notes (Signed)
 .  Numeric Pain Rating Scale and Functional Assessment Average Pain 7   In the last MONTH (on 0-10 scale) has pain interfered with the following?  1. General activity like being  able to carry out your everyday physical activities such as walking, climbing stairs, carrying groceries, or moving a chair?  Rating(6)

## 2019-06-18 NOTE — Progress Notes (Signed)
Robin [name] - 48 y.o. female MRN [id number]  Date of birth: Jul 19, 1971  Office Visit Note: Visit Date: 06/14/2019 PCP: Rutherford Guys, MD Referred by: Rutherford Guys, MD  Subjective: Chief Complaint  Patient presents with  . Neck - Pain, Numbness  . Right Shoulder - Pain, Numbness  . Right Arm - Pain, Numbness  . Right Hand - Pain, Numbness   HPI: Robin [name] is a 48 y.o. female who comes in today At the request of Hazel Sams, PA-C for electrodiagnostic study of the right upper limb.  Patient is right-hand dominant with 20 years of worsening pain numbness and tingling particularly in the right shoulder right arm and right hand index and middle finger.  This could be somewhat of a median nerve distribution versus C6 distribution.  Symptoms of gotten worse over the last year without specific trauma.  She reports extending the right arm makes the symptoms worse and she does get some relief with avoiding salty foods.  Medications of help to a degree.  She has had no prior electrodiagnostic study.  No cervical surgery.  No cervical MRI imaging.  Her case is complicated by systemic lupus erythematosus.  ROS Otherwise per HPI.  Assessment & Plan: Visit Diagnoses:  1. Paresthesia of skin     Plan: Impression: The above electrodiagnostic study is ABNORMAL and reveals evidence of a severe right median nerve entrapment at the wrist (carpal tunnel syndrome) affecting sensory and motor components.   There is no significant electrodiagnostic evidence of any other focal nerve entrapment, brachial plexopathy or cervical radiculopathy.   Recommendations: 1.  Follow-up with referring physician. 2.  Continue current management of symptoms. 3.  Continue use of resting splint at night-time and as needed during the day. 4.  Suggest surgical evaluation.  Meds & Orders: No orders of the defined types were placed in this encounter.   Orders Placed This Encounter  Procedures  . NCV with  EMG (electromyography)    Follow-up: Return for Hazel Sams, PA-C.   Procedures: No procedures performed  EMG & NCV Findings: Evaluation of the right median motor nerve showed prolonged distal onset latency (7.3 ms), reduced amplitude (4.6 mV), and decreased conduction velocity (Elbow-Wrist, 43 m/s).  The right median (across palm) sensory nerve showed no response (Wrist) and prolonged distal peak latency (Palm, 4.8 ms).  All remaining nerves (as indicated in the following tables) were within normal limits.    Needle evaluation of the right abductor pollicis brevis muscle showed diminished recruitment.  All remaining muscles (as indicated in the following table) showed no evidence of electrical instability.    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   There is no significant electrodiagnostic evidence of any other focal nerve entrapment, brachial plexopathy or cervical radiculopathy.   Recommendations: 1.  Follow-up with referring physician. 2.  Continue current management of symptoms. 3.  Continue use of resting splint at night-time and as needed during the day. 4.  Suggest surgical evaluation.  ___________________________ Laurence Spates FAAPMR Board Certified, American Board of Physical Medicine and Rehabilitation    Nerve Conduction Studies Anti Sensory Summary Table   Stim Site NR Peak (ms) Norm Peak (ms) P-T Amp (V) Norm P-T Amp Site1 Site2 Delta-P (ms) Dist (cm) Vel (m/s) Norm Vel (m/s)  Right Median Acr Palm Anti Sensory (2nd Digit)  33.4C  Wrist *NR  <  3.6  >10 Wrist Palm  0.0    Palm    *4.8 <2.0 15.0         Right Radial Anti Sensory (Base 1st Digit)  34.3C  Wrist    2.3 <3.1 23.3  Wrist Base 1st Digit 2.3 0.0    Right Ulnar Anti Sensory (5th Digit)  33.5C  Wrist    3.6 <3.7 21.7 >15.0 Wrist 5th Digit 3.6 14.0 39  >38   Motor Summary Table   Stim Site NR Onset (ms) Norm Onset (ms) O-P Amp (mV) Norm O-P Amp Site1 Site2 Delta-0 (ms) Dist (cm) Vel (m/s) Norm Vel (m/s)  Right Median Motor (Abd Poll Brev)  33.9C    Martin-Gruber  Wrist    *7.3 <4.2 *4.6 >5 Elbow Wrist 4.7 20.0 *43 >50  Elbow    12.0  3.8         Right Ulnar Motor (Abd Dig Min)  23.2C  Wrist    2.7 <4.2 6.5 >3 B Elbow Wrist 3.6 20.0 56 >53  B Elbow    6.3  7.2  A Elbow B Elbow 1.2 9.5 79 >53  A Elbow    7.5  7.6          EMG   Side Muscle Nerve Root Ins Act Fibs Psw Amp Dur Poly Recrt Int Fraser Din Comment  Right 1stDorInt Ulnar C8-T1 Nml Nml Nml Nml Nml 0 Nml Nml   Right Abd Poll Brev Median C8-T1 Nml Nml Nml Nml Nml 0 *Reduced Nml   Right ExtDigCom   Nml Nml Nml Nml Nml 0 Nml Nml   Right Triceps Radial C6-7-8 Nml Nml Nml Nml Nml 0 Nml Nml   Right Deltoid Axillary C5-6 Nml Nml Nml Nml Nml 0 Nml Nml     Nerve Conduction Studies Anti Sensory Left/Right Comparison   Stim Site L Lat (ms) R Lat (ms) L-R Lat (ms) L Amp (V) R Amp (V) L-R Amp (%) Site1 Site2 L Vel (m/s) R Vel (m/s) L-R Vel (m/s)  Median Acr Palm Anti Sensory (2nd Digit)  33.4C  Wrist       Wrist Palm     Palm  *4.8   15.0        Radial Anti Sensory (Base 1st Digit)  34.3C  Wrist  2.3   23.3  Wrist Base 1st Digit     Ulnar Anti Sensory (5th Digit)  33.5C  Wrist  3.6   21.7  Wrist 5th Digit  39    Motor Left/Right Comparison   Stim Site L Lat (ms) R Lat (ms) L-R Lat (ms) L Amp (mV) R Amp (mV) L-R Amp (%) Site1 Site2 L Vel (m/s) R Vel (m/s) L-R Vel (m/s)  Median Motor (Abd Poll Brev)  33.9C    Martin-Gruber  Wrist  *7.3   *4.6  Elbow Wrist  *43   Elbow  12.0   3.8        Ulnar Motor (Abd Dig Min)  23.2C  Wrist  2.7   6.5  B Elbow Wrist  56   B Elbow  6.3   7.2  A Elbow B Elbow  79   A Elbow  7.5   7.6           Waveforms:            Clinical History: No specialty comments available.   She reports that she quit smoking about 17 years ago. Her smoking  use included cigarettes. She has a 7.50  pack-year smoking history. She has never used smokeless tobacco. No results for input(s): HGBA1C, LABURIC in the last 8760 hours.  Objective:  VS:  HT:    WT:   BMI:     BP:   HR: bpm  TEMP: ( )  RESP:  Physical Exam Musculoskeletal:        General: No swelling, tenderness or deformity.     Comments: Inspection reveals flattening of the APB on the right but no atrophy of the bilateral APB or FDI or hand intrinsics. There is no swelling, color changes, allodynia or dystrophic changes. There is 5 out of 5 strength in the bilateral wrist extension, finger abduction and long finger flexion.  There is decreased sensation median nerve distribution on the right. There is a negative Hoffmann's test bilaterally.  Skin:    General: Skin is warm and dry.     Findings: No erythema or rash.  Neurological:     General: No focal deficit present.     Mental Status: She is alert and oriented to person, place, and time.     Motor: No weakness or abnormal muscle tone.     Coordination: Coordination normal.  Psychiatric:        Mood and Affect: Mood normal.        Behavior: Behavior normal.     Ortho Exam Imaging: No results found.  Past Medical/Family/Surgical/Social History: Medications & Allergies reviewed per EMR, new medications updated. Patient Active Problem List   Diagnosis Date Noted  . Seasonal and perennial allergic rhinitis 11/06/2018  . Mild intermittent asthma, uncomplicated 99/37/1696  . Flexural atopic dermatitis 11/06/2018  . History of hyperlipidemia 11/08/2016  . History of anxiety 11/08/2016  . History of retinal tear 11/08/2016  . History of Bilateral plantar fasciitis 08/10/2016  . High risk medication use 07/04/2016  . Lupus ([place]) 05/23/2016  . 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 ADHD (attention deficit hyperactivity disorder) 12/22/2014  . Raynaud's phenomenon 10/17/2013  . Hyperlipidemia 09/28/2011   Past Medical History:   Diagnosis Date  . ADD (attention deficit disorder)   . Allergy   . Anemia   . Anxiety   . Hyperlipidemia   . Ileitis   . Lupus ([place])    Family History  Problem Relation Age of Onset  . Heart disease Mother 28       CAD/stenting  . Hyperlipidemia Mother   . Arthritis Mother        ostearthritis  . Hypertension Mother   . Kidney disease Mother   . Irritable bowel syndrome Mother   . Stroke Mother 71       CVA  . Fibromyalgia Mother   . Hyperlipidemia Father   . Neuropathy Father   . Allergies Father        shellfish  . Hyperlipidemia Sister   . Hypertension Brother   . Colon cancer Neg Hx   . Inflammatory bowel disease Neg Hx    Past Surgical History:  Procedure Laterality Date  . CESAREAN SECTION     x 2  . DENTAL SURGERY    . EYE SURGERY Bilateral    cryoretinopexy   Social History   Occupational History  . Not on file  Tobacco Use  . Smoking status: Former Smoker    Packs/day: 0.75    Years: 10.00    Pack years: 7.50    Types: Cigarettes    Quit date: 07/05/2001    Years since quitting: 17.9  . Smokeless tobacco: Never Used  Substance and Sexual Activity  . Alcohol use: Yes    Alcohol/week: 14.0 standard drinks    Types: 14 Cans of beer per week    Comment: 2 beer daily  . Drug use: Never  . Sexual activity: Yes    Birth control/protection: Condom

## 2019-06-18 NOTE — Procedures (Signed)
EMG & NCV Findings: Evaluation of the right median motor nerve showed prolonged distal onset latency (7.3 ms), reduced amplitude (4.6 mV), and decreased conduction velocity (Elbow-Wrist, 43 m/s).  The right median (across palm) sensory nerve showed no response (Wrist) and prolonged distal peak latency (Palm, 4.8 ms).  All remaining nerves (as indicated in the following tables) were within normal limits.    Needle evaluation of the right abductor pollicis brevis muscle showed diminished recruitment.  All remaining muscles (as indicated in the following table) showed no evidence of electrical instability.    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   There is no significant electrodiagnostic evidence of any other focal nerve entrapment, brachial plexopathy or cervical radiculopathy.   Recommendations: 1.  Follow-up with referring physician. 2.  Continue current management of symptoms. 3.  Continue use of resting splint at night-time and as needed during the day. 4.  Suggest surgical evaluation.  ___________________________ Laurence Spates FAAPMR Board Certified, American Board of Physical Medicine and Rehabilitation    Nerve Conduction Studies Anti Sensory Summary Table   Stim Site NR Peak (ms) Norm Peak (ms) P-T Amp (V) Norm P-T Amp Site1 Site2 Delta-P (ms) Dist (cm) Vel (m/s) Norm Vel (m/s)  Right Median Acr Palm Anti Sensory (2nd Digit)  33.4C  Wrist *NR  <3.6  >10 Wrist Palm  0.0    Palm    *4.8 <2.0 15.0         Right Radial Anti Sensory (Base 1st Digit)  34.3C  Wrist    2.3 <3.1 23.3  Wrist Base 1st Digit 2.3 0.0    Right Ulnar Anti Sensory (5th Digit)  33.5C  Wrist    3.6 <3.7 21.7 >15.0 Wrist 5th Digit 3.6 14.0 39 >38   Motor Summary Table   Stim Site NR Onset (ms) Norm Onset (ms) O-P Amp (mV) Norm O-P Amp Site1 Site2  Delta-0 (ms) Dist (cm) Vel (m/s) Norm Vel (m/s)  Right Median Motor (Abd Poll Brev)  33.9C    Martin-Gruber  Wrist    *7.3 <4.2 *4.6 >5 Elbow Wrist 4.7 20.0 *43 >50  Elbow    12.0  3.8         Right Ulnar Motor (Abd Dig Min)  23.2C  Wrist    2.7 <4.2 6.5 >3 B Elbow Wrist 3.6 20.0 56 >53  B Elbow    6.3  7.2  A Elbow B Elbow 1.2 9.5 79 >53  A Elbow    7.5  7.6          EMG   Side Muscle Nerve Root Ins Act Fibs Psw Amp Dur Poly Recrt Int Fraser Din Comment  Right 1stDorInt Ulnar C8-T1 Nml Nml Nml Nml Nml 0 Nml Nml   Right Abd Poll Brev Median C8-T1 Nml Nml Nml Nml Nml 0 *Reduced Nml   Right ExtDigCom   Nml Nml Nml Nml Nml 0 Nml Nml   Right Triceps Radial C6-7-8 Nml Nml Nml Nml Nml 0 Nml Nml   Right Deltoid Axillary C5-6 Nml Nml Nml Nml Nml 0 Nml Nml     Nerve Conduction Studies Anti Sensory Left/Right Comparison   Stim Site L Lat (ms) R Lat (ms) L-R Lat (ms) L Amp (V) R Amp (V) L-R Amp (%) Site1 Site2 L Vel (m/s) R Vel (m/s) L-R Vel (m/s)  Median Acr Palm Anti Sensory (2nd Digit)  33.4C  Wrist       Wrist Palm     Palm  *4.8   15.0        Radial Anti Sensory (Base 1st Digit)  34.3C  Wrist  2.3   23.3  Wrist Base 1st Digit     Ulnar Anti Sensory (5th Digit)  33.5C  Wrist  3.6   21.7  Wrist 5th Digit  39    Motor Left/Right Comparison   Stim Site L Lat (ms) R Lat (ms) L-R Lat (ms) L Amp (mV) R Amp (mV) L-R Amp (%) Site1 Site2 L Vel (m/s) R Vel (m/s) L-R Vel (m/s)  Median Motor (Abd Poll Brev)  33.9C    Martin-Gruber  Wrist  *7.3   *4.6  Elbow Wrist  *43   Elbow  12.0   3.8        Ulnar Motor (Abd Dig Min)  23.2C  Wrist  2.7   6.5  B Elbow Wrist  56   B Elbow  6.3   7.2  A Elbow B Elbow  79   A Elbow  7.5   7.6           Waveforms:

## 2019-06-21 ENCOUNTER — Encounter: Payer: Self-pay | Admitting: Orthopaedic Surgery

## 2019-06-21 ENCOUNTER — Ambulatory Visit (INDEPENDENT_AMBULATORY_CARE_PROVIDER_SITE_OTHER): Payer: BC Managed Care – PPO | Admitting: Orthopaedic Surgery

## 2019-06-21 ENCOUNTER — Other Ambulatory Visit: Payer: Self-pay

## 2019-06-21 DIAGNOSIS — G5601 Carpal tunnel syndrome, right upper limb: Secondary | ICD-10-CM | POA: Diagnosis not present

## 2019-06-21 NOTE — Progress Notes (Signed)
Office Visit Note   Patient: Robin [name]           Date of Birth: 08-24-1971           MRN: [id number] Visit Date: 06/21/2019              Requested by: Rutherford Guys, MD [address] [place],  [place] [address] PCP: Rutherford Guys, MD   Assessment & Plan: Visit Diagnoses:  1. Right carpal tunnel syndrome     Plan: Impression is right carpal tunnel syndrome with severe compression of the median nerve.  Patient would like to proceed with surgical intervention, but due to upcoming events she does not think she will be able to proceed for a few months.  She would like to proceed with carpal tunnel injection today and this was performed.  We will also go ahead and schedule her for carpal tunnel release for at least 4 to 6 weeks out from today's injection.  Risk, benefits and possible complications reviewed.  Rehab recovery time discussed.  All questions were answered.  Follow-Up Instructions: Return for post-op.   Orders:  Orders Placed This Encounter  Procedures  . Hand/UE Inj   No orders of the defined types were placed in this encounter.     Procedures: Hand/UE Inj: R carpal tunnel for carpal tunnel syndrome on 06/21/2019 8:46 AM Indications: pain Details: 25 G needle, volar approach      Clinical Data: No additional findings.   Subjective: Chief Complaint  Patient presents with  . Right Hand - Pain    HPI patient is a 48 year old right-hand-dominant female who presents our clinic today to discuss nerve conduction studies right upper extremity.  She notes that she has had pain to the right hand for over 20 years.  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  Recently, her pain has worsened.  She is having numbness and tingling throughout the entire right hand which is quite bothersome.  She was referred to Dr. Ernestina Patches by her rheumatologist for EMG/NCS.  Nerve conduction study shows a severe median nerve entrapment.   Review of Systems as detailed in HPI.  All others reviewed and are negative.   Objective: Vital Signs: There were no vitals taken for this visit.  Physical Exam well-developed and well-nourished female in no acute distress.  Alert and oriented x3.  Ortho Exam examination of her right hand reveals a positive Phalen and positive Tinel.  No thenar atrophy.  Full sensation distally.  Specialty Comments:  No specialty comments available.  Imaging: No new imaging   PMFS History: Patient Active Problem List   Diagnosis Date Noted  . Seasonal and perennial allergic rhinitis 11/06/2018  . Mild intermittent asthma, uncomplicated 33/29/5188  . Flexural atopic dermatitis 11/06/2018  . History of hyperlipidemia 11/08/2016  . History of anxiety 11/08/2016  . History of retinal tear 11/08/2016  . History of Bilateral plantar fasciitis 08/10/2016  . High risk medication use 07/04/2016  . Lupus ([place]) 05/23/2016  . 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 ADHD (attention deficit hyperactivity disorder) 12/22/2014  . Raynaud's phenomenon 10/17/2013  . Hyperlipidemia 09/28/2011   Past Medical History:  Diagnosis Date  . ADD (attention deficit disorder)   . Allergy   . Anemia   . Anxiety   . Hyperlipidemia   . Ileitis   . Lupus ([place])     Family History  Problem Relation Age of Onset  .  Heart disease Mother 47       CAD/stenting  . Hyperlipidemia Mother   . Arthritis Mother        ostearthritis  . Hypertension Mother   . Kidney disease Mother   . Irritable bowel syndrome Mother   . Stroke Mother 38       CVA  . Fibromyalgia Mother   . Hyperlipidemia Father   . Neuropathy Father   . Allergies Father        shellfish  . Hyperlipidemia Sister   . Hypertension Brother   . Colon cancer Neg Hx   . Inflammatory bowel disease Neg Hx     Past Surgical History:  Procedure Laterality Date  . CESAREAN SECTION     x 2  . DENTAL SURGERY    . EYE SURGERY Bilateral    cryoretinopexy    Social History   Occupational History  . Not on file  Tobacco Use  . Smoking status: Former Smoker    Packs/day: 0.75    Years: 10.00    Pack years: 7.50    Types: Cigarettes    Quit date: 07/05/2001    Years since quitting: 17.9  . Smokeless tobacco: Never Used  Substance and Sexual Activity  . Alcohol use: Yes    Alcohol/week: 14.0 standard drinks    Types: 14 Cans of beer per week    Comment: 2 beer daily  . Drug use: Never  . Sexual activity: Yes    Birth control/protection: Condom

## 2019-06-24 ENCOUNTER — Encounter: Payer: Self-pay | Admitting: Orthopaedic Surgery

## 2019-07-11 DIAGNOSIS — F339 Major depressive disorder, recurrent, unspecified: Secondary | ICD-10-CM | POA: Diagnosis not present

## 2019-08-06 ENCOUNTER — Telehealth: Payer: Self-pay | Admitting: Pharmacy Technician

## 2019-08-06 NOTE — Telephone Encounter (Signed)
Received notification from [facility] regarding a prior authorization for Hydroxychloroquine. Authorization has been APPROVED from 08/06/19 to 08/04/22.   Will send document to scan center.  Authorization # [id number]   10:12 AM Beatriz Chancellor, CPhT

## 2019-08-12 DIAGNOSIS — F410 Panic disorder [episodic paroxysmal anxiety] without agoraphobia: Secondary | ICD-10-CM | POA: Diagnosis not present

## 2019-08-12 DIAGNOSIS — F3340 Major depressive disorder, recurrent, in remission, unspecified: Secondary | ICD-10-CM | POA: Diagnosis not present

## 2019-08-12 DIAGNOSIS — F411 Generalized anxiety disorder: Secondary | ICD-10-CM | POA: Diagnosis not present

## 2019-08-12 DIAGNOSIS — F4010 Social phobia, unspecified: Secondary | ICD-10-CM | POA: Diagnosis not present

## 2019-08-15 ENCOUNTER — Other Ambulatory Visit: Payer: Self-pay | Admitting: Physician Assistant

## 2019-08-15 DIAGNOSIS — G5601 Carpal tunnel syndrome, right upper limb: Secondary | ICD-10-CM | POA: Diagnosis not present

## 2019-08-15 MED ORDER — ONDANSETRON HCL 4 MG PO TABS: 4.0000 mg | ORAL_TABLET | Freq: Three times a day (TID) | ORAL | 0 refills | Status: DC | PRN

## 2019-08-15 MED ORDER — HYDROCODONE-ACETAMINOPHEN 5-325 MG PO TABS: 1.0000 | ORAL_TABLET | Freq: Three times a day (TID) | ORAL | 0 refills | Status: DC | PRN

## 2019-08-18 ENCOUNTER — Encounter: Payer: Self-pay | Admitting: Orthopaedic Surgery

## 2019-08-20 ENCOUNTER — Other Ambulatory Visit: Payer: Self-pay

## 2019-08-20 ENCOUNTER — Ambulatory Visit (INDEPENDENT_AMBULATORY_CARE_PROVIDER_SITE_OTHER): Payer: BC Managed Care – PPO | Admitting: Orthopaedic Surgery

## 2019-08-20 DIAGNOSIS — G5601 Carpal tunnel syndrome, right upper limb: Secondary | ICD-10-CM

## 2019-08-20 NOTE — Progress Notes (Signed)
Robin [name] is 1 week status post right carpal tunnel release.  She is doing well and not taking any pain medications.  Surgical incision is clean dry and intact no signs of infection.  She does have some bruising in her forearm and palm.  No neurovascular compromise.  She has noticed significant provement in her carpal tunnel symptoms already.  We cover the incision today with mupirocin and Band-Aid and gave her a removable carpal tunnel brace to wear.  Follow-up next week for suture removal.

## 2019-08-22 ENCOUNTER — Encounter: Payer: Self-pay | Admitting: Allergy & Immunology

## 2019-08-22 ENCOUNTER — Encounter: Payer: Self-pay | Admitting: Rheumatology

## 2019-08-22 DIAGNOSIS — F339 Major depressive disorder, recurrent, unspecified: Secondary | ICD-10-CM | POA: Diagnosis not present

## 2019-08-22 DIAGNOSIS — M3219 Other organ or system involvement in systemic lupus erythematosus: Secondary | ICD-10-CM

## 2019-08-22 DIAGNOSIS — Z79899 Other long term (current) drug therapy: Secondary | ICD-10-CM

## 2019-08-23 MED ORDER — AZELASTINE HCL 0.1 % NA SOLN: 2.0000 | Freq: Two times a day (BID) | NASAL | 5 refills | Status: DC | PRN

## 2019-08-23 NOTE — Addendum Note (Signed)
Addended by: Valentina Shaggy on: 08/23/2019 12:52 PM   Modules accepted: Orders

## 2019-08-23 NOTE — Telephone Encounter (Signed)
Ok to give the letter.

## 2019-08-28 ENCOUNTER — Other Ambulatory Visit: Payer: Self-pay

## 2019-08-28 ENCOUNTER — Encounter: Payer: Self-pay | Admitting: Orthopaedic Surgery

## 2019-08-28 ENCOUNTER — Ambulatory Visit (INDEPENDENT_AMBULATORY_CARE_PROVIDER_SITE_OTHER): Payer: BC Managed Care – PPO | Admitting: Physician Assistant

## 2019-08-28 DIAGNOSIS — Z9889 Other specified postprocedural states: Secondary | ICD-10-CM

## 2019-08-28 MED ORDER — MUPIROCIN 2 % EX OINT: TOPICAL_OINTMENT | CUTANEOUS | 0 refills | Status: DC

## 2019-08-28 NOTE — Progress Notes (Signed)
Post-Op Visit Note   Patient: Robin [name]           Date of Birth: [birth year]           MRN: [id number] Visit Date: 08/28/2019 PCP: Rutherford Guys, MD   Assessment & Plan:  Chief Complaint:  Chief Complaint  Patient presents with  . Right Hand - Pain   Visit Diagnoses:  1. S/P carpal tunnel release     Plan: Patient is a pleasant 48 year old female who presents our clinic today 2 weeks status post right carpal tunnel release, date of surgery 08/15/2019.  She has been doing well.  No fevers or chills.  Occasional soreness but nothing more.  She has been applying Neosporin daily.  Examination of her right wrist reveals a well-healing surgical incision with nylon sutures in place.  No evidence of infection or cellulitis.  Today, nylon sutures were removed.  We applied mupirocin and a Band-Aid.  She will continue with mupirocin for the next couple weeks.  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  No heavy lifting or submerging her hand in water for another 2 weeks.  Follow-up with [place] in 4 weeks time for recheck.  Follow-Up Instructions: Return in about 4 weeks (around 09/25/2019).   Orders:  No orders of the defined types were placed in this encounter.  No orders of the defined types were placed in this encounter.   Imaging: No new imaging  PMFS History: Patient Active Problem List   Diagnosis Date Noted  . Seasonal and perennial allergic rhinitis 11/06/2018  . Mild intermittent asthma, uncomplicated 00/17/4944  . Flexural atopic dermatitis 11/06/2018  . History of hyperlipidemia 11/08/2016  . History of anxiety 11/08/2016  . History of retinal tear 11/08/2016  . History of Bilateral plantar fasciitis 08/10/2016  . High risk medication use 07/04/2016  . Lupus ([place]) 05/23/2016  . 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 ADHD (attention deficit hyperactivity disorder) 12/22/2014  . Raynaud's  phenomenon 10/17/2013  . Hyperlipidemia 09/28/2011   Past Medical History:  Diagnosis Date  . ADD (attention deficit disorder)   . Allergy   . Anemia   . Anxiety   . Hyperlipidemia   . Ileitis   . Lupus ([place])     Family History  Problem Relation Age of Onset  . Heart disease Mother 46       CAD/stenting  . Hyperlipidemia Mother   . Arthritis Mother        ostearthritis  . Hypertension Mother   . Kidney disease Mother   . Irritable bowel syndrome Mother   . Stroke Mother 48       CVA  . Fibromyalgia Mother   . Hyperlipidemia Father   . Neuropathy Father   . Allergies Father        shellfish  . Hyperlipidemia Sister   . Hypertension Brother   . Colon cancer Neg Hx   . Inflammatory bowel disease Neg Hx     Past Surgical History:  Procedure Laterality Date  . CESAREAN SECTION     x 2  . DENTAL SURGERY    . EYE SURGERY Bilateral    cryoretinopexy   Social History   Occupational History  . Not on file  Tobacco Use  . Smoking status: Former Smoker    Packs/day: 0.75    Years: 10.00    Pack years: 7.50  Types: Cigarettes    Quit date: 07/05/2001    Years since quitting: 18.1  . Smokeless tobacco: Never Used  Substance and Sexual Activity  . Alcohol use: Yes    Alcohol/week: 14.0 standard drinks    Types: 14 Cans of beer per week    Comment: 2 beer daily  . Drug use: Never  . Sexual activity: Yes    Birth control/protection: Condom

## 2019-08-29 ENCOUNTER — Inpatient Hospital Stay: Payer: BC Managed Care – PPO | Admitting: Orthopaedic Surgery

## 2019-08-29 ENCOUNTER — Telehealth: Payer: Self-pay | Admitting: Rheumatology

## 2019-08-29 NOTE — Telephone Encounter (Signed)
Patient called requesting her labwork orders be sent to [facility] on [address].  Patient states she will call today to schedule an appointment for next week.

## 2019-08-29 NOTE — Telephone Encounter (Signed)
Lab Orders released for [facility].

## 2019-08-30 ENCOUNTER — Ambulatory Visit: Payer: Self-pay | Admitting: Rheumatology

## 2019-09-04 DIAGNOSIS — M3219 Other organ or system involvement in systemic lupus erythematosus: Secondary | ICD-10-CM | POA: Diagnosis not present

## 2019-09-04 DIAGNOSIS — Z79899 Other long term (current) drug therapy: Secondary | ICD-10-CM | POA: Diagnosis not present

## 2019-09-05 LAB — COMPLETE METABOLIC PANEL WITH GFR
AG Ratio: 2 (calc) (ref 1.0–2.5)
ALT: 11 U/L (ref 6–29)
AST: 15 U/L (ref 10–35)
Albumin: 4.5 g/dL (ref 3.6–5.1)
Alkaline phosphatase (APISO): 23 U/L — ABNORMAL LOW (ref 31–125)
BUN: 11 mg/dL (ref 7–25)
CO2: 25 mmol/L (ref 20–32)
Calcium: 9 mg/dL (ref 8.6–10.2)
Chloride: 105 mmol/L (ref 98–110)
Creat: 0.83 mg/dL (ref 0.50–1.10)
GFR, Est African American: 98 mL/min/{1.73_m2} (ref >=60)
GFR, Est Non African American: 85 mL/min/{1.73_m2} (ref >=60)
Globulin: 2.3 g/dL (calc) (ref 1.9–3.7)
Glucose, Bld: 63 mg/dL — ABNORMAL LOW (ref 65–99)
Potassium: 4.2 mmol/L (ref 3.5–5.3)
Sodium: 139 mmol/L (ref 135–146)
Total Bilirubin: 0.6 mg/dL (ref 0.2–1.2)
Total Protein: 6.8 g/dL (ref 6.1–8.1)

## 2019-09-05 LAB — CBC WITH DIFFERENTIAL/PLATELET
Absolute Monocytes: 484 cells/uL (ref 200–950)
Basophils Absolute: 19 cells/uL (ref 0–200)
Basophils Relative: 0.4 %
Eosinophils Absolute: 127 cells/uL (ref 15–500)
Eosinophils Relative: 2.7 %
HCT: 39.8 % (ref 35.0–45.0)
Hemoglobin: 13.3 g/dL (ref 11.7–15.5)
Lymphs Abs: 1664 cells/uL (ref 850–3900)
MCH: 31.3 pg (ref 27.0–33.0)
MCHC: 33.4 g/dL (ref 32.0–36.0)
MCV: 93.6 fL (ref 80.0–100.0)
MPV: 10.1 fL (ref 7.5–12.5)
Monocytes Relative: 10.3 %
Neutro Abs: 2406 cells/uL (ref 1500–7800)
Neutrophils Relative %: 51.2 %
Platelets: 249 10*3/uL (ref 140–400)
RBC: 4.25 10*6/uL (ref 3.80–5.10)
RDW: 13 % (ref 11.0–15.0)
Total Lymphocyte: 35.4 %
WBC: 4.7 10*3/uL (ref 3.8–10.8)

## 2019-09-05 LAB — URINALYSIS, ROUTINE W REFLEX MICROSCOPIC
Bilirubin Urine: NEGATIVE
Glucose, UA: NEGATIVE
Hgb urine dipstick: NEGATIVE
Ketones, ur: NEGATIVE
Leukocytes,Ua: NEGATIVE
Nitrite: NEGATIVE
Protein, ur: NEGATIVE
Specific Gravity, Urine: 1.009 (ref 1.001–1.03)
pH: 7.5 (ref 5.0–8.0)

## 2019-09-05 LAB — C3 AND C4
C3 Complement: 80 mg/dL — ABNORMAL LOW (ref 83–193)
C4 Complement: 20 mg/dL (ref 15–57)

## 2019-09-05 LAB — SEDIMENTATION RATE: Sed Rate: 2 mm/h (ref 0–20)

## 2019-09-05 LAB — ANTI-DNA ANTIBODY, DOUBLE-STRANDED: ds DNA Ab: 1 IU/mL

## 2019-09-09 NOTE — Telephone Encounter (Signed)
Labs are stable and do not indicate active autoimmune disease.  She should continue current treatment.

## 2019-09-13 DIAGNOSIS — Z9289 Personal history of other medical treatment: Secondary | ICD-10-CM

## 2019-09-13 HISTORY — DX: Personal history of other medical treatment: Z92.89

## 2019-09-16 NOTE — Progress Notes (Signed)
Virtual Visit via Video Note  I connected with [name] on 09/18/19 at  1:00 PM EST by a video enabled telemedicine application and verified that I am speaking with the correct person using two identifiers.  Location: Patient: Home Provider: Clinic This service was conducted via virtual visit.  Both audio and visual tools were used.  The patient was located at home. I was located in my office.  Consent was obtained prior to the virtual visit and is aware of possible charges through their insurance for this visit.  The patient is an established patient.  Dr. Estanislado Pandy, MD conducted the virtual visit and Hazel Sams, PA-C acted as scribe during the service.  Office staff helped with scheduling follow up visits after the service was conducted.     I discussed the limitations of evaluation and management by telemedicine and the availability of in person appointments. The patient expressed understanding and agreed to proceed.  CC: Right knee joint pain  History of Present Illness: Robin [name] is a 49 y.o. female with history of systemic lupus erythematosus and Raynaud's syndrome.  Patient is taking Plaquenil 200 mg 1 tablet BID M-F. She denies any recent lupus flares.  She states she tried increasing her exercise routine recently, but she states she has been experiencing pain, stiffness and swelling in the right knee joint. She has also had nocturnal pain.  She states she tries to warm up before high impact activities and uses a roller for muscle soreness.  She denies any other joint pain or joint swelling.   She had a nerve conduction study on 06/14/19 performed by Dr. Ernestina Patches which revealed severe right median nerve entrapment and she was scheduled for a cortisone injection on 06/21/19 by Dr. Erlinda Hong.  She had surgical release performed by Dr. Erlinda Hong on 08/15/19.  She continues to wear a wrist splint.   Review of Systems  Constitutional: Negative for fever and malaise/fatigue.  Eyes: Negative for  photophobia, pain, discharge and redness.  Respiratory: Negative for cough, shortness of breath and wheezing.   Cardiovascular: Negative for chest pain and palpitations.  Gastrointestinal: Negative for blood in stool, constipation and diarrhea.  Genitourinary: Negative for dysuria.  Musculoskeletal: Positive for joint pain. Negative for back pain, myalgias and neck pain.       +Joint swelling +Morning stiffness   Skin: Negative for rash.  Neurological: Negative for dizziness and headaches.  Psychiatric/Behavioral: Negative for depression. The patient is not nervous/anxious and does not have insomnia.      Observations/Objective: Physical Exam  Constitutional: She is oriented to person, place, and time and well-developed, well-nourished, and in no distress.  HENT:  Head: Normocephalic and atraumatic.  Eyes: Conjunctivae are normal.  Pulmonary/Chest: Effort normal.  Neurological: She is alert and oriented to person, place, and time.  Psychiatric: Mood, memory, affect and judgment normal.   Patient reports morning stiffness for 30  minutes.   Patient reports nocturnal pain.  Difficulty dressing/grooming: Denies Difficulty climbing stairs: Denies Difficulty getting out of chair: Denies Difficulty using hands for taps, buttons, cutlery, and/or writing: Reports   Assessment and Plan: Visit Diagnoses: Other systemic lupus erythematosus with other organ involvement (HCC) - Low C3, dsDNA 1: She has not had any signs or symptoms of a lupus flare recently.  She is clinically doing well on Plaquenil 200 mg 1 tablet BID M-F. She had autoimmune lab work on 09/04/2019, which were stable.  She has been experiencing right knee joint pain and inflammation, which she  attributes to increasing her aerobic and weight bearing exercises.  She has started to alter her exercise routine to only doing low impact activities only.  She is not having any mechanical symptoms.  She declined scheduling an appointment  for x-rays and a cortisone injection due to the concern for covid-19.  We will send in a prednisone taper starting at 20 mg tapering by 5 mg every 4 days. She is not having any other joint pain or joint inflammation at this time.  She has not had any recent rashes.  She has been having more frequent symptoms of Raynaud's but no digital ulcerations.  She has not had any oral or nasal ulcerations.  She denies sicca symptoms.  She will continue taking PLQ 200 mg 1 tablet BID M-F.  She was advised to notify us if she develops any new or worsening symptoms.  We will update lab work at follow up visit  She will follow up in 4 months.   High risk medication use - Plaquenil 200 mg 1 tablet by mouth twice daily M-F. Last Plaquenil eye exam normal on 02/16/2018.  She is overdue to update  PLQ eye exam.  CBC and CMP were drawn on 09/04/19.    Raynaud's phenomenon without gangrene: She has been experiencing intermittent symptoms of Raynaud's in her hands and feet.  No digital ulcerations or signs of gangrene.   Acute pain of right knee: She has been experiencing increased pain, stiffness, and joint swelling in the right knee joint after trying to increase her aerobic and weight bearing exercises.  She has been experiencing nocturnal pain and has had to alter her exercise routine.  We will send in a prednisone taper starting at 20 mg tapering by 5 mg every 4 days.   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   Other fatigue: Stable.  She has been exercising on a regular basis.   Plantar fasciitis: Resolved   S/P carpal tunnel release-Right: She had a nerve conduction study on 06/14/19 performed by Dr. Ernestina Patches which revealed severe right median nerve entrapment and she was scheduled for a cortisone injection on 06/21/19 by Dr. Erlinda Hong.  She had surgical release performed by Dr. Erlinda Hong on 08/15/19.  She continues to wear a carpal tunnel splint.   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    I discussed the assessment and treatment plan with the patient. The patient was provided an opportunity to ask questions and all were answered. The patient agreed with the plan and demonstrated an understanding of the instructions.   The patient was advised to call back or seek an in-person evaluation if the symptoms worsen or if the condition fails to improve as anticipated.  I provided 25 minutes of non-face-to-face time during this encounter.  Bo Merino, MD   Scribed by-  Hazel Sams , PA-C

## 2019-09-17 ENCOUNTER — Encounter: Payer: Self-pay | Admitting: Orthopaedic Surgery

## 2019-09-17 ENCOUNTER — Telehealth: Payer: Self-pay | Admitting: Orthopaedic Surgery

## 2019-09-17 NOTE — Telephone Encounter (Signed)
Called patient left message to return call reference her mychart request for a virtual visit with Dr Erlinda Hong

## 2019-09-18 ENCOUNTER — Other Ambulatory Visit: Payer: Self-pay

## 2019-09-18 ENCOUNTER — Telehealth (INDEPENDENT_AMBULATORY_CARE_PROVIDER_SITE_OTHER): Payer: BC Managed Care – PPO | Admitting: Rheumatology

## 2019-09-18 ENCOUNTER — Encounter: Payer: Self-pay | Admitting: Rheumatology

## 2019-09-18 DIAGNOSIS — Z9889 Other specified postprocedural states: Secondary | ICD-10-CM

## 2019-09-18 DIAGNOSIS — L52 Erythema nodosum: Secondary | ICD-10-CM

## 2019-09-18 DIAGNOSIS — I7300 Raynaud's syndrome without gangrene: Secondary | ICD-10-CM | POA: Diagnosis not present

## 2019-09-18 DIAGNOSIS — Z8669 Personal history of other diseases of the nervous system and sense organs: Secondary | ICD-10-CM

## 2019-09-18 DIAGNOSIS — Z79899 Other long term (current) drug therapy: Secondary | ICD-10-CM | POA: Diagnosis not present

## 2019-09-18 DIAGNOSIS — R202 Paresthesia of skin: Secondary | ICD-10-CM

## 2019-09-18 DIAGNOSIS — M3219 Other organ or system involvement in systemic lupus erythematosus: Secondary | ICD-10-CM | POA: Diagnosis not present

## 2019-09-18 DIAGNOSIS — K5000 Crohn's disease of small intestine without complications: Secondary | ICD-10-CM

## 2019-09-18 DIAGNOSIS — Z8659 Personal history of other mental and behavioral disorders: Secondary | ICD-10-CM

## 2019-09-18 DIAGNOSIS — F900 Attention-deficit hyperactivity disorder, predominantly inattentive type: Secondary | ICD-10-CM

## 2019-09-18 DIAGNOSIS — M722 Plantar fascial fibromatosis: Secondary | ICD-10-CM

## 2019-09-18 DIAGNOSIS — Z8639 Personal history of other endocrine, nutritional and metabolic disease: Secondary | ICD-10-CM

## 2019-09-18 DIAGNOSIS — M25561 Pain in right knee: Secondary | ICD-10-CM

## 2019-09-18 DIAGNOSIS — R5383 Other fatigue: Secondary | ICD-10-CM

## 2019-09-18 MED ORDER — PREDNISONE 5 MG PO TABS: ORAL_TABLET | ORAL | 0 refills | Status: DC

## 2019-10-02 ENCOUNTER — Telehealth: Payer: Self-pay | Admitting: Rheumatology

## 2019-10-02 NOTE — Telephone Encounter (Signed)
-----   Message from [place] sent at 09/18/2019 12:37 PM EST ----- Patient due for follow up in 4 months. Thanks!

## 2019-10-02 NOTE — Telephone Encounter (Signed)
I LMOM for patient to call, and schedule a follow up appointment for around 01/16/2020.

## 2019-10-04 DIAGNOSIS — F339 Major depressive disorder, recurrent, unspecified: Secondary | ICD-10-CM | POA: Diagnosis not present

## 2019-10-10 ENCOUNTER — Other Ambulatory Visit: Payer: Self-pay | Admitting: *Deleted

## 2019-10-10 MED ORDER — MONTELUKAST SODIUM 10 MG PO TABS: 10.0000 mg | ORAL_TABLET | Freq: Every day | ORAL | 0 refills | Status: DC

## 2019-10-17 DIAGNOSIS — F339 Major depressive disorder, recurrent, unspecified: Secondary | ICD-10-CM | POA: Diagnosis not present

## 2019-11-04 DIAGNOSIS — F411 Generalized anxiety disorder: Secondary | ICD-10-CM | POA: Diagnosis not present

## 2019-11-04 DIAGNOSIS — F410 Panic disorder [episodic paroxysmal anxiety] without agoraphobia: Secondary | ICD-10-CM | POA: Diagnosis not present

## 2019-11-04 DIAGNOSIS — F3340 Major depressive disorder, recurrent, in remission, unspecified: Secondary | ICD-10-CM | POA: Diagnosis not present

## 2019-11-04 DIAGNOSIS — F4010 Social phobia, unspecified: Secondary | ICD-10-CM | POA: Diagnosis not present

## 2019-11-14 DIAGNOSIS — F339 Major depressive disorder, recurrent, unspecified: Secondary | ICD-10-CM | POA: Diagnosis not present

## 2019-11-27 DIAGNOSIS — F902 Attention-deficit hyperactivity disorder, combined type: Secondary | ICD-10-CM | POA: Diagnosis not present

## 2019-11-27 DIAGNOSIS — F411 Generalized anxiety disorder: Secondary | ICD-10-CM | POA: Diagnosis not present

## 2019-11-27 DIAGNOSIS — F339 Major depressive disorder, recurrent, unspecified: Secondary | ICD-10-CM | POA: Diagnosis not present

## 2019-12-31 ENCOUNTER — Encounter: Payer: Self-pay | Admitting: Allergy & Immunology

## 2019-12-31 ENCOUNTER — Ambulatory Visit: Payer: BC Managed Care – PPO | Admitting: Allergy & Immunology

## 2019-12-31 ENCOUNTER — Other Ambulatory Visit: Payer: Self-pay

## 2019-12-31 VITALS — BP 118/82 | HR 98 | Temp 97.8°F | Resp 16 | Ht 66.0 in | Wt 154.8 lb

## 2019-12-31 DIAGNOSIS — J4520 Mild intermittent asthma, uncomplicated: Secondary | ICD-10-CM

## 2019-12-31 DIAGNOSIS — L2089 Other atopic dermatitis: Secondary | ICD-10-CM

## 2019-12-31 DIAGNOSIS — J3089 Other allergic rhinitis: Secondary | ICD-10-CM

## 2019-12-31 DIAGNOSIS — J302 Other seasonal allergic rhinitis: Secondary | ICD-10-CM

## 2019-12-31 NOTE — Patient Instructions (Addendum)
1. Mild intermittent asthma, uncomplicated - Lung testing looks great today. - We will not make any medication changes today. - Continue with albuterol every 4 hours as needed.   2. Seasonal and perennial allergic rhinitis (grasses, indoor molds, outdoor molds, dust mites, cat and cockroach) - Continue with an over the counter antihistamine daily.  - Continue with Singulair daily as needed. - Continue with Astelin as needed.  - Make an appointment to start allergy shots in two weeks.    3. Flexural atopic dermatit - Continue with moisturizing twice daily as needed.  4. Return in about 6 months (around 07/01/2020). This can be an in-person, a virtual Webex or a telephone follow up visit.   Please inform us of any Emergency Department visits, hospitalizations, or changes in symptoms. Call us before going to the ED for breathing or allergy symptoms since we might be able to fit you in for a sick visit. Feel free to contact us anytime with any questions, problems, or concerns.  It was a pleasure to see you again today!  Websites that have reliable patient information: 1. American Academy of Asthma, Allergy, and Immunology: www.aaaai.org 2. Food Allergy Research and Education (FARE): foodallergy.org 3. Mothers of Asthmatics: http://www.asthmacommunitynetwork.org 4. American College of Allergy, Asthma, and Immunology: www.acaai.org   COVID-19 Vaccine Information can be found at: ShippingScam.co.uk For questions related to vaccine distribution or appointments, please email [email or web address] or call [phone].     "Like" [place] on Facebook and Instagram for our latest updates!       HAPPY SPRING!  Make sure you are registered to vote! If you have moved or changed any of your contact information, you will need to get this updated before voting!  In some cases, you MAY be able to register to vote online:  [email or web address]

## 2019-12-31 NOTE — Progress Notes (Addendum)
FOLLOW UP  Date of Service/Encounter:  12/31/19   Assessment:   Seasonal and perennial allergic rhinitis (grasses, indoor molds, outdoor molds, dust mites, cat and cockroach)  Mild intermittent asthma, uncomplicated  Adverse food reaction (pineapple, banana) - with negative testing today  Flexural atopic dermatitis  Plan/Recommendations:   1. Mild intermittent asthma, uncomplicated - Lung testing looks great today. - We will not make any medication changes today. - Continue with albuterol every 4 hours as needed.   2. Seasonal and perennial allergic rhinitis (grasses, indoor molds, outdoor molds, dust mites, cat and cockroach) - Continue with an over the counter antihistamine daily.  - Continue with Singulair daily as needed. - Continue with Astelin as needed.  - Make an appointment to start allergy shots in two weeks.    3. Flexural atopic dermatit - Continue with moisturizing twice daily as needed.  4. Return in about 6 months (around 07/01/2020). This can be an in-person, a virtual Webex or a telephone follow up visit.  Subjective:   Robin [name] is a 49 y.o. female presenting today for follow up of  Chief Complaint  Patient presents with  . Allergic Rhinitis   . Asthma  . Immunotherapy    Robin [name] has a history of the following: Patient Active Problem List   Diagnosis Date Noted  . Seasonal and perennial allergic rhinitis 11/06/2018  . Mild intermittent asthma, uncomplicated 81/44/8185  . Flexural atopic dermatitis 11/06/2018  . History of hyperlipidemia 11/08/2016  . History of anxiety 11/08/2016  . History of retinal tear 11/08/2016  . History of Bilateral plantar fasciitis 08/10/2016  . High risk medication use 07/04/2016  . Lupus ([place]) 05/23/2016  . 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[place]) 03/18/2016  . ADHD (attention deficit hyperactivity disorder) 12/22/2014  . Raynaud's phenomenon 10/17/2013  . Hyperlipidemia 09/28/2011    History obtained  from: chart review and patient.  Robin [name] is a 49 y.o. female presenting for a follow up visit.  She was last seen in February 2020.  At that time, she had testing that was positive to grasses, indoor and outdoor molds, dust mite, cat, and cockroach.  We stopped her Claritin and started Allegra and Singulair.  We also continue with Astelin.  Asthma is under good control with albuterol as needed.  Testing was negative to pineapple and banana.  Atopic dermatitis was controlled with moisturizing.  Since the last visit, she has done well.  They have been very careful during the coronavirus pandemic and have been limiting their exposures.  She is on Plaquenil and Adderall.  Asthma/Respiratory Symptom History: She remains on albuterol as needed.  She has not needed it much at all. Cailah's asthma has been well controlled. She has not required rescue medication, experienced nocturnal awakenings due to lower respiratory symptoms, nor have activities of daily living been limited. She has required no Emergency Department or Urgent Care visits for her asthma. She has required zero courses of systemic steroids for asthma exacerbations since the last visit. ACT score today is 25, indicating excellent asthma symptom control.   Allergic Rhinitis Symptom History: She remains on Allegra and Singulair.  She does not use the nose spray very often at all.  She does not like the taste of the Astelin but will use it when her symptoms are particularly terrible.  She has a history of a dry mouth anyway because of her Adderall that she takes, so the terrible taste of the Astelin seems to bother her even more than usual.  She and her son are interested in starting allergen immunotherapy.  She is actually using the Singulair on a more as needed basis.  She noticed that it made her more irritable when she was taking it daily.  This seems to work well for her.  Eczema Symptom History: Eczema is controlled with moisturizing twice  daily.  She has not required any prednisone or antibiotics.  Otherwise, there have been no changes to her past medical history, surgical history, family history, or social history. She is now working with the [facility].     Review of Systems  Constitutional: Negative.  Negative for fever, malaise/fatigue and weight loss.  HENT: Positive for congestion and sinus pain. Negative for ear discharge and ear pain.        Cobblestoning present in the posterior oropharynx.   Eyes: Negative for pain, discharge and redness.  Respiratory: Negative for cough, sputum production, shortness of breath and wheezing.   Cardiovascular: Negative.  Negative for chest pain and palpitations.  Gastrointestinal: Negative for abdominal pain, constipation, diarrhea, heartburn, nausea and vomiting.  Skin: Negative.  Negative for itching and rash.  Neurological: Negative for dizziness and headaches.  Endo/Heme/Allergies: Negative for environmental allergies. Does not bruise/bleed easily.       Objective:   Blood pressure 118/82, pulse 98, temperature 97.8 F (36.6 C), temperature source Temporal, resp. rate 16, height 5' 6"  (1.676 m), weight 154 lb 12.8 oz (70.2 kg), SpO2 97 %. Body mass index is 24.99 kg/m.   Physical Exam:  Physical Exam  Constitutional: She appears well-developed.  Very pleasant female.  HENT:  Head: Normocephalic and atraumatic.  Right Ear: Tympanic membrane, external ear and ear canal normal.  Left Ear: Tympanic membrane, external ear and ear canal normal.  Nose: Mucosal edema and rhinorrhea present. No nasal deformity or septal deviation. No epistaxis. Right sinus exhibits no maxillary sinus tenderness and no frontal sinus tenderness. Left sinus exhibits no maxillary sinus tenderness and no frontal sinus tenderness.  Mouth/Throat: Uvula is midline and oropharynx is clear and moist. Mucous membranes are not pale and not dry.  Enlarged turbinates bilaterally.  There is cobblestoning in the  posterior oropharynx.  Eyes: Pupils are equal, round, and reactive to light. Conjunctivae and EOM are normal. Right eye exhibits no chemosis and no discharge. Left eye exhibits no chemosis and no discharge. Right conjunctiva is not injected. Left conjunctiva is not injected.  Cardiovascular: Normal rate, regular rhythm and normal heart sounds.  Respiratory: Effort normal and breath sounds normal. No accessory muscle usage. No tachypnea. No respiratory distress. She has no wheezes. She has no rhonchi. She has no rales. She exhibits no tenderness.  Moving air well in all lung fields.  No increased work of breathing.  Lymphadenopathy:    She has no cervical adenopathy.  Neurological: She is alert.  Skin: No abrasion, no petechiae and no rash noted. Rash is not papular, not vesicular and not urticarial. No erythema. No pallor.  No eczematous or urticarial lesions noted.  Psychiatric: She has a normal mood and affect.     Diagnostic studies:    Spirometry: results normal (FEV1: 2.95/95%, FVC: 3.80/98%, FEV1/FVC: 78%).    Spirometry consistent with normal pattern.   Allergy Studies: none       Salvatore Marvel, MD  [facility]

## 2020-01-01 ENCOUNTER — Encounter: Payer: Self-pay | Admitting: Allergy & Immunology

## 2020-01-01 DIAGNOSIS — J302 Other seasonal allergic rhinitis: Secondary | ICD-10-CM | POA: Diagnosis not present

## 2020-01-01 NOTE — Progress Notes (Signed)
VIALS EXP 12-31-20

## 2020-01-02 DIAGNOSIS — J3089 Other allergic rhinitis: Secondary | ICD-10-CM | POA: Diagnosis not present

## 2020-01-21 ENCOUNTER — Other Ambulatory Visit: Payer: Self-pay

## 2020-01-21 ENCOUNTER — Ambulatory Visit: Payer: BC Managed Care – PPO

## 2020-01-21 DIAGNOSIS — J302 Other seasonal allergic rhinitis: Secondary | ICD-10-CM

## 2020-01-21 DIAGNOSIS — J3089 Other allergic rhinitis: Secondary | ICD-10-CM | POA: Diagnosis not present

## 2020-01-21 MED ORDER — EPINEPHRINE 0.3 MG/0.3ML IJ SOAJ: 0.3000 mg | INTRAMUSCULAR | 1 refills | Status: DC | PRN

## 2020-01-21 NOTE — Progress Notes (Signed)
Immunotherapy   Patient Details  Name: [name] MRN: [id number] Date of Birth: [birth year]  01/21/2020  [name] started injections for  [facility] Following schedule: B  Frequency:2 times per week Epi-Pen:Epi-Pen Available   Patient waited 30 minutes in office post injection. Patient had no local or systemic symptoms upon leaving the office.  Consent signed and patient instructions given.   Rosalio Loud 01/21/2020, 8:42 AM

## 2020-01-28 ENCOUNTER — Ambulatory Visit (INDEPENDENT_AMBULATORY_CARE_PROVIDER_SITE_OTHER): Payer: BC Managed Care – PPO

## 2020-01-28 DIAGNOSIS — J302 Other seasonal allergic rhinitis: Secondary | ICD-10-CM | POA: Diagnosis not present

## 2020-01-28 DIAGNOSIS — J3089 Other allergic rhinitis: Secondary | ICD-10-CM

## 2020-02-07 ENCOUNTER — Ambulatory Visit (INDEPENDENT_AMBULATORY_CARE_PROVIDER_SITE_OTHER): Payer: BC Managed Care – PPO

## 2020-02-07 DIAGNOSIS — J301 Allergic rhinitis due to pollen: Secondary | ICD-10-CM

## 2020-02-10 ENCOUNTER — Encounter: Payer: Self-pay | Admitting: Family Medicine

## 2020-02-18 DIAGNOSIS — F339 Major depressive disorder, recurrent, unspecified: Secondary | ICD-10-CM | POA: Diagnosis not present

## 2020-02-18 DIAGNOSIS — F902 Attention-deficit hyperactivity disorder, combined type: Secondary | ICD-10-CM | POA: Diagnosis not present

## 2020-02-18 DIAGNOSIS — F411 Generalized anxiety disorder: Secondary | ICD-10-CM | POA: Diagnosis not present

## 2020-02-20 ENCOUNTER — Other Ambulatory Visit: Payer: Self-pay

## 2020-02-20 ENCOUNTER — Ambulatory Visit: Payer: BC Managed Care – PPO | Admitting: Family Medicine

## 2020-02-20 VITALS — BP 122/81 | HR 67 | Temp 97.7°F | Ht 66.0 in | Wt 155.0 lb

## 2020-02-20 DIAGNOSIS — R5383 Other fatigue: Secondary | ICD-10-CM | POA: Diagnosis not present

## 2020-02-20 DIAGNOSIS — D259 Leiomyoma of uterus, unspecified: Secondary | ICD-10-CM | POA: Diagnosis not present

## 2020-02-20 DIAGNOSIS — N921 Excessive and frequent menstruation with irregular cycle: Secondary | ICD-10-CM

## 2020-02-20 LAB — POCT CBC
Granulocyte percent: 63.5 %G (ref 37–80)
HCT, POC: 39.5 % (ref 29–41)
Hemoglobin: 13.2 g/dL (ref 11–14.6)
Lymph, poc: 2.4 (ref 0.6–3.4)
MCH, POC: 31.2 pg (ref 27–31.2)
MCHC: 33.4 g/dL (ref 31.8–35.4)
MCV: 93.5 fL (ref 76–111)
MID (cbc): 0.1 (ref 0–0.9)
MPV: 7.3 fL (ref 0–99.8)
POC Granulocyte: 3 (ref 2–6.9)
POC LYMPH PERCENT: 34.1 %L (ref 10–50)
POC MID %: 2.5 %M (ref 0–12)
Platelet Count, POC: 261 10*3/uL (ref 142–424)
RBC: 4.22 M/uL (ref 4.04–5.48)
RDW, POC: 13.2 %
WBC: 4.7 10*3/uL (ref 4.6–10.2)

## 2020-02-20 LAB — POC MICROSCOPIC URINALYSIS (UMFC): Mucus: ABSENT

## 2020-02-20 LAB — POCT URINE PREGNANCY: Preg Test, Ur: NEGATIVE

## 2020-02-20 NOTE — Patient Instructions (Addendum)
Per our discussion today, we will leave the pelvic examination to be done by the gynecologist when you see her.  Referral has been made to Dr. Kathrynn Running who did the phone consultation previously with you last year.  You are being scheduled for a repeat pelvic ultrasound to follow-up on the one from last year, and should be contacted by someone in the next few days as to when that is being scheduled.  If you are on your vacation already please call the office that it is scheduled that and reschedule it.  No medications are being provided for you today.    If you have lab work done today you will be contacted with your lab results within the next 2 weeks.  If you have not heard from [place] then please contact us. The fastest way to get your results is to register for My Chart.   IF you received an x-ray today, you will receive an invoice from [facility]. Please contact [facility] at [phone] with questions or concerns regarding your invoice.   IF you received labwork today, you will receive an invoice from [facility]. Please contact [facility] at [phone] with questions or concerns regarding your invoice.   Our billing staff will not be able to assist you with questions regarding bills from these companies.  You will be contacted with the lab results as soon as they are available. The fastest way to get your results is to activate your My Chart account. Instructions are located on the last page of this paperwork. If you have not heard from [place] regarding the results in 2 weeks, please contact this office.

## 2020-02-20 NOTE — Progress Notes (Signed)
Patient ID: Robin [name], female    DOB: July 28, 1971  Age: 49 y.o. MRN: [id number]  Chief Complaint  Patient presents with  . Fibroids    Pt stated that she has fibroids and she has been having periods every other week and they are heavy cycles.    Subjective:   Patient has been having problems with vaginal bleeding for a long time.  This year and is been 2 times a month often.  She bleeds heavier from the start than she did in the past.  She has been getting feeling a little fatigued, and wanted to come in today and get checked for anemia before going on vacation.  She knows she needs to get the uterine fibroids situation take care of, but last year could not do so due to the COVID-19 situation.  She is sexually involved with her husband.  Seems to bleed more after intercourse.  She usually uses condoms.  She thinks she can feel the coverage in her lower abdomen.  She has lupus and depression and is on a number of medications including Adderall, Wellbutrin, Prozac, Allegra, Plaquenil.  Current allergies, medications, problem list, past/family and social histories reviewed.  Objective:  BP 122/81 (BP Location: Right Arm, Patient Position: Sitting, Cuff Size: Normal)   Pulse 67   Temp 97.7 F (36.5 C) (Temporal)   Ht 5' 6"  (1.676 m)   Wt 155 lb (70.3 kg)   LMP 02/10/2020   SpO2 100%   BMI 25.02 kg/m   Alert and oriented.  No major distress.  Abdomen soft.  Her bladder is full, but I cannot feel anything else on abdominal exam.  Decided in discussion with her to have her wait until she sees the gynecologist to get the pelvic exam done in the near future.  She will probably need an endometrial biopsy at that time.  Assessment & Plan:   Assessment: 1. Uterine leiomyoma, unspecified location   2. Other fatigue   3. Menorrhagia with irregular cycle       Plan: See instructions.   Orders Placed This Encounter  Procedures  . US Pelvis Complete    Please try and schedule  where she had her last one done.    Standing Status:   Future    Standing Expiration Date:   02/19/2021    Order Specific Question:   Reason for Exam (SYMPTOM  OR DIAGNOSIS REQUIRED)    Answer:   Vaginal bleeding with history of uterine fibroids    Order Specific Question:   Preferred imaging location?    Answer:   [facility]  . CMP14+EGFR  . TSH  . Ambulatory referral to Obstetrics / Gynecology    Referral Priority:   Routine    Referral Type:   Consultation    Referral Reason:   Specialty Services Required    Referred to Provider:   Emily Filbert, MD    Requested Specialty:   Obstetrics and Gynecology    Number of Visits Requested:   1  . POCT CBC  . POCT Microscopic Urinalysis ([facility])  . POCT urine pregnancy    No orders of the defined types were placed in this encounter.        Patient Instructions   Per our discussion today, we will leave the pelvic examination to be done by the gynecologist when you see her.  Referral has been made to Dr. Kathrynn Running who did the phone consultation previously with you last year.  You  are being scheduled for a repeat pelvic ultrasound to follow-up on the one from last year, and should be contacted by someone in the next few days as to when that is being scheduled.  If you are on your vacation already please call the office that it is scheduled that and reschedule it.  No medications are being provided for you today.    If you have lab work done today you will be contacted with your lab results within the next 2 weeks.  If you have not heard from [place] then please contact us. The fastest way to get your results is to register for My Chart.   IF you received an x-ray today, you will receive an invoice from [facility]iology. Please contact [facility] Radiology at [phone] with questions or concerns regarding your invoice.   IF you received labwork today, you will receive an invoice from [place]. Please contact [facility] at  [phone] with questions or concerns regarding your invoice.   Our billing staff will not be able to assist you with questions regarding bills from these companies.  You will be contacted with the lab results as soon as they are available. The fastest way to get your results is to activate your My Chart account. Instructions are located on the last page of this paperwork. If you have not heard from [place] regarding the results in 2 weeks, please contact this office.        No follow-ups on file.   Ruben Reason, MD 02/20/2020

## 2020-02-21 ENCOUNTER — Other Ambulatory Visit: Payer: Self-pay | Admitting: Rheumatology

## 2020-02-21 ENCOUNTER — Other Ambulatory Visit: Payer: Self-pay | Admitting: Allergy & Immunology

## 2020-02-21 DIAGNOSIS — M3219 Other organ or system involvement in systemic lupus erythematosus: Secondary | ICD-10-CM

## 2020-02-21 LAB — CMP14+EGFR
ALT: 13 IU/L (ref 0–32)
AST: 19 IU/L (ref 0–40)
Albumin/Globulin Ratio: 1.8 (ref 1.2–2.2)
Albumin: 4.6 g/dL (ref 3.8–4.8)
Alkaline Phosphatase: 36 IU/L — ABNORMAL LOW (ref 48–121)
BUN/Creatinine Ratio: 13 (ref 9–23)
BUN: 11 mg/dL (ref 6–24)
Bilirubin Total: 0.4 mg/dL (ref 0.0–1.2)
CO2: 23 mmol/L (ref 20–29)
Calcium: 9.3 mg/dL (ref 8.7–10.2)
Chloride: 104 mmol/L (ref 96–106)
Creatinine, Ser: 0.88 mg/dL (ref 0.57–1.00)
GFR calc Af Amer: 91 mL/min/{1.73_m2} (ref >59)
GFR calc non Af Amer: 79 mL/min/{1.73_m2} (ref >59)
Globulin, Total: 2.5 g/dL (ref 1.5–4.5)
Glucose: 79 mg/dL (ref 65–99)
Potassium: 4.5 mmol/L (ref 3.5–5.2)
Sodium: 140 mmol/L (ref 134–144)
Total Protein: 7.1 g/dL (ref 6.0–8.5)

## 2020-02-21 LAB — TSH: TSH: 1.91 u[IU]/mL (ref 0.450–4.500)

## 2020-02-21 NOTE — Progress Notes (Signed)
Labs are normal.

## 2020-02-21 NOTE — Telephone Encounter (Signed)
Last Visit: 09/18/2019 Next Visit: 03/10/2020 Labs: CMP 02/20/2020 Alk Phos. 36 CBC 09/04/2019 Labs are stable  Eye exam: 02/16/18 WNL  Current Dose per office note 09/18/2019: Plaquenil 200 mg 1 tabletby mouth twice daily M-F  Last Fill: 05/30/2019   Patient advised we are needing an update PLQ eye exam. Patient states she has an appointment scheduled for September 2021. Patient is on the waiting list for an earlier appointment.  Okay to refill PLQ?

## 2020-02-26 NOTE — Progress Notes (Signed)
Office Visit Note  Patient: Robin [name]             Date of Birth: [birth year]           MRN: [id number]             PCP: Rutherford Guys, MD Referring: Rutherford Guys, MD Visit Date: 03/10/2020 Occupation: @GUAROCC @  Subjective:  Fatigue and heavy periods.   History of Present Illness: Robin [name] is a 49 y.o. female with history of systemic lupus dermatosis.  She states she has been having frequent and heavy periods.  She has been tired.  She is trying to get hysterectomy as soon as possible.  She states she could not get her eye examination for Plaquenil due to Covid.  She is trying to schedule an appointment at her earliest possible.  She gives history of sores in her mouth and nose.  She denies any photosensitivity or Raynaud's phenomenon.  She feels tightness in her joints but has not seen any visible swelling.  Activities of Daily Living:  Patient reports morning stiffness for 24  hours.   Patient Reports nocturnal pain.  Difficulty dressing/grooming: Reports Difficulty climbing stairs: Denies Difficulty getting out of chair: Denies Difficulty using hands for taps, buttons, cutlery, and/or writing: Reports  Review of Systems  Constitutional: Positive for fatigue. Negative for night sweats, weight gain and weight loss.  HENT: Positive for mouth sores, mouth dryness and nose dryness. Negative for trouble swallowing and trouble swallowing.        Nose sores  Eyes: Positive for dryness. Negative for pain, redness, itching and visual disturbance.  Respiratory: Negative for cough, shortness of breath and difficulty breathing.   Cardiovascular: Negative for chest pain, palpitations, hypertension, irregular heartbeat and swelling in legs/feet.  Gastrointestinal: Negative for blood in stool, constipation and diarrhea.  Endocrine: Negative for increased urination.  Genitourinary: Positive for difficulty urinating and pelvic pain. Negative for vaginal dryness.    Musculoskeletal: Positive for arthralgias, joint pain, myalgias, morning stiffness and myalgias. Negative for joint swelling, muscle weakness and muscle tenderness.  Skin: Negative for color change, rash, hair loss, redness, skin tightness, ulcers and sensitivity to sunlight.  Allergic/Immunologic: Negative for susceptible to infections.  Neurological: Positive for headaches. Negative for dizziness, light-headedness, numbness, memory loss, night sweats and weakness.       Hormonal  Hematological: Negative for bruising/bleeding tendency and swollen glands.  Psychiatric/Behavioral: Positive for depressed mood. Negative for confusion and sleep disturbance. The patient is not nervous/anxious.     PMFS History:  Patient Active Problem List   Diagnosis Date Noted  . Seasonal and perennial allergic rhinitis 11/06/2018  . Mild intermittent asthma, uncomplicated 10/93/2355  . Flexural atopic dermatitis 11/06/2018  . History of hyperlipidemia 11/08/2016  . History of anxiety 11/08/2016  . History of retinal tear 11/08/2016  . History of Bilateral plantar fasciitis 08/10/2016  . High risk medication use 07/04/2016  . Lupus ([place]) 05/23/2016  . 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 ADHD (attention deficit hyperactivity disorder) 12/22/2014  . Raynaud's phenomenon 10/17/2013  . Hyperlipidemia 09/28/2011    Past Medical History:  Diagnosis Date  . ADD (attention deficit disorder)   . Allergy   . Anemia   . Anxiety   . Asthma   . Hyperlipidemia   . Ileitis   . Lupus ([place])     Family History  Problem Relation Age of Onset  . Heart disease Mother 11       CAD/stenting  .  Hyperlipidemia Mother   . Arthritis Mother        ostearthritis  . Hypertension Mother   . Kidney disease Mother   . Irritable bowel syndrome Mother   . Stroke Mother 74       CVA  . Fibromyalgia Mother   . Eczema Mother   . Hyperlipidemia Father   . Neuropathy Father   . Allergies Father        shellfish  .  Hyperlipidemia Sister   . Hypertension Brother   . Colon cancer Neg Hx   . Inflammatory bowel disease Neg Hx   . Allergic rhinitis Neg Hx   . Angioedema Neg Hx   . Atopy Neg Hx   . Asthma Neg Hx   . Immunodeficiency Neg Hx   . Urticaria Neg Hx    Past Surgical History:  Procedure Laterality Date  . CARPAL TUNNEL RELEASE Right    08/2019  . CESAREAN SECTION     x 2  . DENTAL SURGERY    . EYE SURGERY Bilateral    cryoretinopexy   Social History   Social History Narrative   Marital status: married x 17 years; happily married      Children: 2 children (69, 62)      Lives: with husband, 2 children.      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 Influenza,inj,Quad PF,6+ Mos 05/12/2014, 11/27/2015, 05/23/2016, 10/30/2017, 07/30/2018  . Pneumococcal Conjugate-13 05/23/2016  . Tdap 09/28/2011     Objective: Vital Signs: BP 125/79 (BP Location: Left Arm, Patient Position: Sitting, Cuff Size: Normal)   Pulse 87   Resp 15   Ht 5' 6"  (1.676 m)   Wt 157 lb 9.6 oz (71.5 kg)   LMP 02/10/2020   BMI 25.44 kg/m    Physical Exam Vitals and nursing note reviewed.  Constitutional:      Appearance: She is well-developed.  HENT:     Head: Normocephalic and atraumatic.  Eyes:     Conjunctiva/sclera: Conjunctivae normal.  Cardiovascular:     Rate and Rhythm: Normal rate and regular rhythm.     Heart sounds: Normal heart sounds.  Pulmonary:     Effort: Pulmonary effort is normal.     Breath sounds: Normal breath sounds.  Abdominal:     General: Bowel sounds are normal.     Palpations: Abdomen is soft.  Musculoskeletal:     Cervical back: Normal range of motion.  Lymphadenopathy:     Cervical: No cervical adenopathy.  Skin:    General: Skin is warm and dry.      Capillary Refill: Capillary refill takes less than 2 seconds.  Neurological:     Mental Status: She is alert and oriented to person, place, and time.  Psychiatric:        Behavior: Behavior normal.      Musculoskeletal Exam: C-spine thoracic and lumbar spine with good range of motion.  Shoulder joints, elbow joints, wrist joints with good range of motion.  She has bilateral PIP and DIP thickening with no synovitis.  Hip joints, knee joints, ankles with good range of motion.  She has some osteoarthritic changes in her feet.  CDAI Exam: CDAI Score: -- Patient  Global: --; Provider Global: -- Swollen: --; Tender: -- Joint Exam 03/10/2020   No joint exam has been documented for this visit   There is currently no information documented on the homunculus. Go to the Rheumatology activity and complete the homunculus joint exam.  Investigation: No additional findings.  Imaging: US PELVIC COMPLETE WITH TRANSVAGINAL  Result Date: 03/05/2020 CLINICAL DATA:  Abnormal bleeding with pain, heavy frequent cycles every other week, history of Caesarean section, uterine fibroids; LMP 02/17/2020 EXAM: TRANSABDOMINAL AND TRANSVAGINAL ULTRASOUND OF PELVIS TECHNIQUE: Both transabdominal and transvaginal ultrasound examinations of the pelvis were performed. Transabdominal technique was performed for global imaging of the pelvis including uterus, ovaries, adnexal regions, and pelvic cul-de-sac. It was necessary to proceed with endovaginal exam following the transabdominal exam to visualize the ovaries. COMPARISON:  10/05/2018 FINDINGS: Uterus Measurements: 10.8 x 5.4 x 5.5 cm = volume: 167 mL. Retroverted. Heterogeneous myometrium. Multiple uterine leiomyomata are identified, all subserosal or intramural, largest lesions measuring 2.9 cm, 2.8 cm, and 2.8 cm in greatest sizes. No definite submucosal leiomyomata are identified. Nabothian cyst at cervix. Endometrium Thickness: 10 mm.  No endometrial fluid or focal  abnormality Right ovary Measurements: 3.2 x 2.9 x 2.0 cm = volume: 9.5 mL. Dominant follicle without mass Left ovary Measurements: 3.9 x 2.4 x 2.2 cm = volume: 11.2 mL. Dominant follicle. Additional small corpus luteum. No additional masses. Other findings Small amount of nonspecific free pelvic fluid.  No adnexal masses. IMPRESSION: Multiple uterine leiomyomata, all subserosal to intramural without definite submucosal extension. Remainder of exam unremarkable. Electronically Signed   By: Lavonia Dana M.D.   On: 03/05/2020 18:04    Recent Labs: Lab Results  Component Value Date   WBC 4.7 02/20/2020   HGB 13.2 02/20/2020   PLT 249 09/04/2019   NA 140 02/20/2020   K 4.5 02/20/2020   CL 104 02/20/2020   CO2 23 02/20/2020   GLUCOSE 79 02/20/2020   BUN 11 02/20/2020   CREATININE 0.88 02/20/2020   BILITOT 0.4 02/20/2020   ALKPHOS 36 (L) 02/20/2020   AST 19 02/20/2020   ALT 13 02/20/2020   PROT 7.1 02/20/2020   ALBUMIN 4.6 02/20/2020   CALCIUM 9.3 02/20/2020   GFRAA 91 02/20/2020    Speciality Comments: PLQ Eye Exam: 02/16/18 WNL Follow up in 1 year  Procedures:  No procedures performed Allergies: Avelox [moxifloxacin hcl in nacl], Cefaclor, Dust mite extract, Latex, Mold extract [trichophyton], Red wine complex [germanium], Meloxicam, and Naproxen   Assessment / Plan:     Visit Diagnoses: Other systemic lupus erythematosus with other organ involvement (HCC) - Low C3, dsDNA 1: She complains of oral ulcers or nasal ulcers.  She denies photosensitivity or Raynaud's phenomenon.  Use of sunscreen was discussed.  Will obtain autoimmune labs today.  High risk medication use - Plaquenil 200 mg 1 tablet by mouth twice daily M-F. PLQ Eye Exam: 02/16/18.  Patient is aware that she is due for eye examination.  She will schedule it as soon as possible.  Risk of ocular toxicity was emphasized.  Raynaud's phenomenon without gangrene-currently not active.  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  Other fatigue-she has been experiencing increased fatigue which could be related to menorrhagia.  Excessive bleeding in premenopausal period-patient has multiple uterine fibroids.  She is trying to get surgery as soon as possible.  Plantar fasciitis - Resolved   S/P carpal tunnel release - Right:She had surgical release performed by Dr. Erlinda Hong on 08/15/19.    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 Urinalysis, Routine w reflex microscopic  . Anti-DNA antibody, double-stranded  . ANA  . C3 and C4  . Sedimentation rate   No orders of the defined types were placed in this encounter.     Follow-Up Instructions: Return in about 5 months (around 08/10/2020) for Systemic lupus.  Will contact her once more results available.   Bo Merino, MD  Note - This record has been created using Editor, commissioning.  Chart creation errors have been sought, but may not always  have been located. Such creation errors do not reflect on  the standard of medical care.

## 2020-03-05 ENCOUNTER — Ambulatory Visit
Admission: RE | Admit: 2020-03-05 | Discharge: 2020-03-05 | Disposition: A | Discharge status: Home / Self Care | Payer: BC Managed Care – PPO | Source: Ambulatory Visit | Attending: Family Medicine | Admitting: Family Medicine

## 2020-03-05 ENCOUNTER — Ambulatory Visit (INDEPENDENT_AMBULATORY_CARE_PROVIDER_SITE_OTHER): Payer: BC Managed Care – PPO

## 2020-03-05 DIAGNOSIS — J301 Allergic rhinitis due to pollen: Secondary | ICD-10-CM

## 2020-03-05 DIAGNOSIS — N921 Excessive and frequent menstruation with irregular cycle: Secondary | ICD-10-CM

## 2020-03-05 DIAGNOSIS — D252 Subserosal leiomyoma of uterus: Secondary | ICD-10-CM | POA: Diagnosis not present

## 2020-03-05 DIAGNOSIS — D259 Leiomyoma of uterus, unspecified: Secondary | ICD-10-CM

## 2020-03-05 DIAGNOSIS — D251 Intramural leiomyoma of uterus: Secondary | ICD-10-CM | POA: Diagnosis not present

## 2020-03-06 ENCOUNTER — Encounter: Payer: Self-pay | Admitting: Family Medicine

## 2020-03-06 NOTE — Progress Notes (Signed)
You do have a lot of uterine fibroids..  I think you should see your gyn for futher evaluation of the bleeding.  If you do no have one we will refer you.  Let us know.  Ruben Reason MD

## 2020-03-10 ENCOUNTER — Other Ambulatory Visit: Payer: Self-pay

## 2020-03-10 ENCOUNTER — Encounter: Payer: Self-pay | Admitting: Rheumatology

## 2020-03-10 ENCOUNTER — Ambulatory Visit: Payer: BC Managed Care – PPO | Admitting: Rheumatology

## 2020-03-10 VITALS — BP 125/79 | HR 87 | Resp 15 | Ht 66.0 in | Wt 157.6 lb

## 2020-03-10 DIAGNOSIS — R5383 Other fatigue: Secondary | ICD-10-CM

## 2020-03-10 DIAGNOSIS — Z9889 Other specified postprocedural states: Secondary | ICD-10-CM

## 2020-03-10 DIAGNOSIS — L52 Erythema nodosum: Secondary | ICD-10-CM

## 2020-03-10 DIAGNOSIS — M3219 Other organ or system involvement in systemic lupus erythematosus: Secondary | ICD-10-CM | POA: Diagnosis not present

## 2020-03-10 DIAGNOSIS — I7300 Raynaud's syndrome without gangrene: Secondary | ICD-10-CM | POA: Diagnosis not present

## 2020-03-10 DIAGNOSIS — N924 Excessive bleeding in the premenopausal period: Secondary | ICD-10-CM

## 2020-03-10 DIAGNOSIS — Z8639 Personal history of other endocrine, nutritional and metabolic disease: Secondary | ICD-10-CM

## 2020-03-10 DIAGNOSIS — Z79899 Other long term (current) drug therapy: Secondary | ICD-10-CM

## 2020-03-10 DIAGNOSIS — M722 Plantar fascial fibromatosis: Secondary | ICD-10-CM

## 2020-03-10 DIAGNOSIS — F900 Attention-deficit hyperactivity disorder, predominantly inattentive type: Secondary | ICD-10-CM

## 2020-03-10 DIAGNOSIS — Z8659 Personal history of other mental and behavioral disorders: Secondary | ICD-10-CM

## 2020-03-10 DIAGNOSIS — K5000 Crohn's disease of small intestine without complications: Secondary | ICD-10-CM

## 2020-03-10 DIAGNOSIS — Z8669 Personal history of other diseases of the nervous system and sense organs: Secondary | ICD-10-CM

## 2020-03-12 ENCOUNTER — Ambulatory Visit (INDEPENDENT_AMBULATORY_CARE_PROVIDER_SITE_OTHER): Payer: BC Managed Care – PPO

## 2020-03-12 DIAGNOSIS — J309 Allergic rhinitis, unspecified: Secondary | ICD-10-CM

## 2020-03-12 DIAGNOSIS — J301 Allergic rhinitis due to pollen: Secondary | ICD-10-CM

## 2020-03-12 LAB — URINALYSIS, ROUTINE W REFLEX MICROSCOPIC
Bilirubin Urine: NEGATIVE
Glucose, UA: NEGATIVE
Hgb urine dipstick: NEGATIVE
Ketones, ur: NEGATIVE
Leukocytes,Ua: NEGATIVE
Nitrite: NEGATIVE
Protein, ur: NEGATIVE
Specific Gravity, Urine: 1.006 (ref 1.001–1.03)
pH: 7 (ref 5.0–8.0)

## 2020-03-12 LAB — ANTI-NUCLEAR AB-TITER (ANA TITER)
ANA TITER: 1:80 {titer} — ABNORMAL HIGH
ANA Titer 1: 1:40 {titer} — ABNORMAL HIGH

## 2020-03-12 LAB — C3 AND C4
C3 Complement: 87 mg/dL (ref 83–193)
C4 Complement: 25 mg/dL (ref 15–57)

## 2020-03-12 LAB — ANTI-DNA ANTIBODY, DOUBLE-STRANDED: ds DNA Ab: 1 IU/mL

## 2020-03-12 LAB — SEDIMENTATION RATE: Sed Rate: 2 mm/h (ref 0–20)

## 2020-03-12 LAB — ANA: Anti Nuclear Antibody (ANA): POSITIVE — AB

## 2020-03-13 ENCOUNTER — Encounter: Payer: Self-pay | Admitting: Rheumatology

## 2020-03-20 ENCOUNTER — Ambulatory Visit: Payer: BC Managed Care – PPO | Admitting: Family Medicine

## 2020-03-20 ENCOUNTER — Ambulatory Visit (INDEPENDENT_AMBULATORY_CARE_PROVIDER_SITE_OTHER): Payer: BC Managed Care – PPO

## 2020-03-20 DIAGNOSIS — J309 Allergic rhinitis, unspecified: Secondary | ICD-10-CM | POA: Diagnosis not present

## 2020-04-03 ENCOUNTER — Ambulatory Visit (INDEPENDENT_AMBULATORY_CARE_PROVIDER_SITE_OTHER): Payer: BC Managed Care – PPO

## 2020-04-03 DIAGNOSIS — J309 Allergic rhinitis, unspecified: Secondary | ICD-10-CM

## 2020-04-09 ENCOUNTER — Ambulatory Visit (INDEPENDENT_AMBULATORY_CARE_PROVIDER_SITE_OTHER): Payer: BC Managed Care – PPO

## 2020-04-09 DIAGNOSIS — J309 Allergic rhinitis, unspecified: Secondary | ICD-10-CM | POA: Diagnosis not present

## 2020-04-15 DIAGNOSIS — Z20828 Contact with and (suspected) exposure to other viral communicable diseases: Secondary | ICD-10-CM | POA: Diagnosis not present

## 2020-04-17 ENCOUNTER — Ambulatory Visit (INDEPENDENT_AMBULATORY_CARE_PROVIDER_SITE_OTHER): Payer: BC Managed Care – PPO

## 2020-04-17 DIAGNOSIS — J309 Allergic rhinitis, unspecified: Secondary | ICD-10-CM

## 2020-04-20 ENCOUNTER — Other Ambulatory Visit: Payer: Self-pay

## 2020-04-20 ENCOUNTER — Ambulatory Visit (INDEPENDENT_AMBULATORY_CARE_PROVIDER_SITE_OTHER): Payer: BC Managed Care – PPO | Admitting: Obstetrics and Gynecology

## 2020-04-20 ENCOUNTER — Other Ambulatory Visit (HOSPITAL_COMMUNITY)
Admission: RE | Admit: 2020-04-20 | Discharge: 2020-04-20 | Disposition: A | Discharge status: Home / Self Care | Payer: BC Managed Care – PPO | Source: Ambulatory Visit | Attending: Obstetrics and Gynecology | Admitting: Obstetrics and Gynecology

## 2020-04-20 ENCOUNTER — Encounter: Payer: Self-pay | Admitting: Obstetrics and Gynecology

## 2020-04-20 VITALS — BP 117/80 | HR 83 | Ht 66.0 in | Wt 156.3 lb

## 2020-04-20 DIAGNOSIS — Z3202 Encounter for pregnancy test, result negative: Secondary | ICD-10-CM | POA: Diagnosis not present

## 2020-04-20 DIAGNOSIS — N939 Abnormal uterine and vaginal bleeding, unspecified: Secondary | ICD-10-CM | POA: Insufficient documentation

## 2020-04-20 HISTORY — PX: ENDOMETRIAL BIOPSY: PRO73

## 2020-04-20 LAB — POCT PREGNANCY, URINE: Preg Test, Ur: NEGATIVE

## 2020-04-20 NOTE — Patient Instructions (Signed)
Hydrothermal endometrial ablation

## 2020-04-20 NOTE — Procedures (Signed)
Endometrial Biopsy Procedure Note  Pre-operative Diagnosis: AUB  Post-operative Diagnosis: same  Procedure Details  Urine pregnancy test was done and result was negative.  The risks (including infection, bleeding, pain, and uterine perforation) and benefits of the procedure were explained to the patient and Written informed consent was obtained.  The patient was placed in the dorsal lithotomy position.  Bimanual exam showed the uterus to be in the retroflexed position.  A Graves' speculum inserted in the vagina, and the cervix was visualized and a pap smear performed. The cervix was then prepped with povidone iodine, and a sharp tenaculum was applied to the anterior lip of the cervix for stabilization.  A pipelle was inserted into the uterine cavity and sounded the uterus to a depth of 8cm.  A Small amount of tissue and large amount of clot was collected after 2 passes. The sample was sent for pathologic examination.  Condition: Stable  Complications: None  Plan: The patient was advised to call for any fever or for prolonged or severe pain or bleeding. She was advised to use OTC analgesics as needed for mild to moderate pain. She was advised to avoid vaginal intercourse for 48 hours or until the bleeding has completely stopped.  [name] MD Attending Center for [facility])

## 2020-04-20 NOTE — Progress Notes (Signed)
Obstetrics and Gynecology New Patient Evaluation  Appointment Date: 04/20/2020  OBGYN Clinic: Center for [facility]  Primary Care Provider: Rutherford [name]  Referring Provider: Posey Boyer, MD  Chief Complaint: AUB  History of Present Illness: Robin [name] is a 49 y.o. Caucasian G2P0 (Patient's last menstrual period was 04/17/2020 (exact date).), seen for the above chief complaint. Her past medical history is significant for fibroids, lupus, anxiety/depression.  Patient seen by Dr. Hulan Fray (GYN) in march 2020 for new patient consult for AUB. She recommended an endometrial biopsy and d/w her re: ablation vs hysterectomy. Unfortunately, due to covid, patient lost to follow up.  At that time, she was having prolonged periods, which sound as if they are worse. Ultrasound shows fibroids (see below). She denies any hot flashes, night sweats or vaginal dryness.   Review of Systems: Pertinent items noted in HPI and remainder of comprehensive ROS otherwise negative.   Patient Active Problem List   Diagnosis Date Noted  . Seasonal and perennial allergic rhinitis 11/06/2018  . Mild intermittent asthma, uncomplicated 34/91/7915  . Flexural atopic dermatitis 11/06/2018  . History of hyperlipidemia 11/08/2016  . History of anxiety 11/08/2016  . History of retinal tear 11/08/2016  . History of Bilateral plantar fasciitis 08/10/2016  . High risk medication use 07/04/2016  . Lupus ([place]) 05/23/2016  . 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 ADHD (attention deficit hyperactivity disorder) 12/22/2014  . Raynaud's phenomenon 10/17/2013  . Hyperlipidemia 09/28/2011     Past Medical History:  Past Medical History:  Diagnosis Date  . ADD (attention deficit disorder)   . Allergy   . Anemia   . Anxiety   . Asthma   . Hyperlipidemia   . Ileitis   . Lupus [facility])     Past Surgical History:  Past Surgical History:  Procedure Laterality Date  . CARPAL TUNNEL RELEASE  Right    08/2019  . CESAREAN SECTION     x 2  . DENTAL SURGERY    . EYE SURGERY Bilateral    cryoretinopexy    Past Obstetrical History:  OB History  Gravida Para Term Preterm AB Living  2            SAB TAB Ectopic Multiple Live Births          2    # Outcome Date GA Lbr Len/2nd Weight Sex Delivery Anes PTL Lv  2 Gravida           1 Gravida             Obstetric Comments  c-section x 2    Past Gynecological History: As per HPI. Periods: qmonth, lasts for two weeks at times and she can have multiple periods in a month. They are heavy with clots and somewhat painful History of Pap Smear(s): Yes.   Last pap 2018, which was negative She is currently using no method for contraception.   Social History:  Social History   Socioeconomic History  . Marital status: Married    Spouse name: Not on file  . Number of children: 2  . Years of education: 29  . Highest education level: Not on file  Occupational History  . Not on file  Tobacco Use  . Smoking status: Former Smoker    Packs/day: 0.75    Years: 10.00    Pack years: 7.50    Types: Cigarettes    Quit date: 07/05/2001    Years since quitting: 18.8  . Smokeless  tobacco: Never Used  Vaping Use  . Vaping Use: Never used  Substance and Sexual Activity  . Alcohol use: Yes    Alcohol/week: 14.0 standard drinks    Types: 14 Cans of beer per week    Comment: 2 beer daily  . Drug use: Never  . Sexual activity: Yes    Birth control/protection: Condom  Other Topics Concern  . Not on file  Social History Narrative   Marital status: married x 17 years; happily married      Children: 2 children (41, 32)      Lives: with husband, 2 children.      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  Difficulty of Paying Living Expenses:   Food Insecurity: No Food Insecurity  . Worried About Charity fundraiser in the Last Year: Never true  . Ran Out of Food in the Last Year: Never true  Transportation Needs: No Transportation Needs  . Lack of Transportation (Medical): No  . Lack of Transportation (Non-Medical): No  Physical Activity:   . Days of Exercise per Week:   . Minutes of Exercise per Session:   Stress:   . Feeling of Stress :   Social Connections:   . Frequency of Communication with Friends and Family:   . Frequency of Social Gatherings with Friends and Family:   . Attends Religious Services:   . Active Member of Clubs or Organizations:   . Attends Archivist Meetings:   Robin [name] Kitchen Marital Status:   Intimate Partner Violence:   . Fear of Current or Ex-Partner:   . Emotionally Abused:   Robin [name] Kitchen Physically Abused:   . Sexually Abused:     Family History:  Family History  Problem Relation Age of Onset  . Heart disease Mother 29       CAD/stenting  . Hyperlipidemia Mother   . Arthritis Mother        ostearthritis  . Hypertension Mother   . Kidney disease Mother   . Irritable bowel syndrome Mother   . Stroke Mother 92       CVA  . Fibromyalgia Mother   . Eczema Mother   . Hyperlipidemia Father   . Neuropathy Father   . Allergies Father        shellfish  . Hyperlipidemia Sister   . Hypertension Brother   . Colon cancer Neg Hx   . Inflammatory bowel disease Neg Hx   . Allergic rhinitis Neg Hx   . Angioedema Neg Hx   . Atopy Neg Hx   . Asthma Neg Hx   . Immunodeficiency Neg Hx   . Urticaria Neg Hx     Medications Bronwen Betters. Lambright had no medications administered during this visit. Current Outpatient Medications  Medication Sig Dispense Refill  . acetaminophen (TYLENOL) 500 MG tablet Take 1,000 mg by mouth every 6 (six) hours as needed for mild pain.    Robin [name] Kitchen albuterol (PROVENTIL HFA;VENTOLIN HFA) 108 (90 Base) MCG/ACT inhaler Inhale 2 puffs into the lungs  every 6 (six) hours as needed for wheezing or shortness of breath. 1 Inhaler 0  . amphetamine-dextroamphetamine (ADDERALL) 10 MG tablet Take 10 mg once a day  to twice daily. (Patient taking differently: Take 20 mg by mouth daily with breakfast. Take 10 mg once a day to twice daily.) 60 tablet 0  . azelastine (ASTELIN) 0.1 % nasal spray Place 2 sprays into both nostrils 2 (two) times daily as needed for rhinitis. PLACE 2 SPRAYS INTO BOTH NOSTRILS TWICE DAILY 30 mL 5  . buPROPion (WELLBUTRIN XL) 300 MG 24 hr tablet     . DiphenhydrAMINE HCl (BENADRYL ALLERGY PO) Take by mouth as needed.    Robin [name] Kitchen EPINEPHrine 0.3 mg/0.3 mL IJ SOAJ injection Inject 0.3 mLs (0.3 mg total) into the muscle as needed for anaphylaxis. 2 each 1  . Fexofenadine HCl (ALLEGRA PO) Take by mouth daily.    Robin [name] Kitchen FLUoxetine (PROZAC) 10 MG capsule Take 10 mg by mouth every morning.    . hydroxychloroquine (PLAQUENIL) 200 MG tablet TAKE 1 TABLET BY MOUTH IN THE MORNING AND 1/2 TABLET BY MOUTH IN THE EVENING. 135 tablet 0  . Melatonin 5 MG CAPS Take by mouth.    . montelukast (SINGULAIR) 10 MG tablet TAKE 1 TABLET(10 MG) BY MOUTH AT BEDTIME 30 tablet 3  . Omega 3-6-9 Fatty Acids (OMEGA-3-6-9 PO) Take 1 capsule by mouth daily.     . sodium chloride (OCEAN) 0.65 % nasal spray Place 1 spray into the nose daily as needed for congestion.     Robin [name] Kitchen UNABLE TO FIND once a week. Med Name: allergy shots    . VITAMIN D, CHOLECALCIFEROL, PO Take 500 mg by mouth.     Current Facility-Administered Medications  Medication Dose Route Frequency Provider Last Rate Last Admin  . magic mouthwash  5 mL Oral TID Bo Merino, MD        Allergies Avelox [moxifloxacin hcl in nacl], Cefaclor, Dust mite extract, Latex, Mold extract [trichophyton], Red wine complex [germanium], Meloxicam, and Naproxen   Physical Exam:  BP 117/80   Pulse 83   Ht 5' 6"  (1.676 m)   Wt 156 lb 4.8 oz (70.9 kg)   LMP 04/17/2020 (Exact Date)   BMI 25.23 kg/m  Body mass index is  25.23 kg/m.  General appearance: Well nourished, well developed female in no acute distress.  Respiratory:   Normal respiratory effort Abdomen: positive bowel sounds and no masses, hernias; diffusely non tender to palpation, non distended. Well healed low transverse skin incision Neuro/Psych:  Normal mood and affect.  Skin:  Warm and dry.  Lymphatic:  No inguinal lymphadenopathy.   Pelvic exam: is not limited by body habitus EGBUS: within normal limits Vagina: with scant old blood in vault. No discharge, no active bleeding Cervix: normal appearing cervix without tenderness, discharge or lesions. Uterus:  8-10 wk sized, mobile, nttp Adnexa:  normal adnexa and no mass, fullness, tenderness Rectovaginal: deferred  Laboratory: June 2021 tsh and cbc normal.   Radiology: images reviewed Narrative & Impression  CLINICAL DATA:  Abnormal bleeding with pain, heavy frequent cycles every other week, history of Caesarean section, uterine fibroids; LMP 02/17/2020  EXAM: TRANSABDOMINAL AND TRANSVAGINAL ULTRASOUND OF PELVIS  TECHNIQUE: Both transabdominal and transvaginal ultrasound examinations of the pelvis were performed. Transabdominal technique was performed for global imaging of the pelvis including uterus, ovaries, adnexal regions, and pelvic cul-de-sac. It was necessary to proceed with endovaginal exam following the transabdominal exam to visualize the ovaries.  COMPARISON:  10/05/2018  FINDINGS: Uterus  Measurements: 10.8 x 5.4 x 5.5 cm = volume: 167 mL. Retroverted. Heterogeneous myometrium. Multiple uterine leiomyomata are identified, all subserosal or intramural, largest lesions measuring  2.9 cm, 2.8 cm, and 2.8 cm in greatest sizes. No definite submucosal leiomyomata are identified. Nabothian cyst at cervix.  Endometrium  Thickness: 10 mm.  No endometrial fluid or focal abnormality  Right ovary  Measurements: 3.2 x 2.9 x 2.0 cm = volume: 9.5 mL. Dominant  follicle without mass  Left ovary  Measurements: 3.9 x 2.4 x 2.2 cm = volume: 11.2 mL. Dominant follicle. Additional small corpus luteum. No additional masses.  Other findings  Small amount of nonspecific free pelvic fluid.  No adnexal masses.  IMPRESSION: Multiple uterine leiomyomata, all subserosal to intramural without definite submucosal extension.  Remainder of exam unremarkable.   Electronically Signed   By: Lavonia Dana M.D.   On: 03/05/2020 18:04     Assessment: pt stable  Plan:  1. Abnormal uterine bleeding (AUB) Images reviewed and uterine cavity appears to be able to fit an IUD w/o issue. I d/w her re: the different options and I told her that she has medical option (mirena, POPs) if APLA testing is negative or surgical. With surgical, I told her that an endometrial ablation or a hysterectomy are good options and that I'd try a hysterectomy laparoscopically. As for an ablation, I'd recommend an HTA since she's retroverted on u/s. Both options are good with pros and cons in terms of r/b for each, recovery, etc d/w her. Follow up endometrial biopsy from today and touch base with patient afterwards. - Cytology - PAP( [place]) - Surgical pathology( [place]/ [facility])  Orders Placed This Encounter  Procedures  . Pregnancy, urine POC    RTC PRN  [name] MD Attending Center for [facility])

## 2020-04-21 LAB — SURGICAL PATHOLOGY

## 2020-04-23 ENCOUNTER — Ambulatory Visit (INDEPENDENT_AMBULATORY_CARE_PROVIDER_SITE_OTHER): Payer: BC Managed Care – PPO

## 2020-04-23 DIAGNOSIS — J309 Allergic rhinitis, unspecified: Secondary | ICD-10-CM | POA: Diagnosis not present

## 2020-04-24 LAB — CYTOLOGY - PAP
Chlamydia: NEGATIVE
Comment: NEGATIVE
Comment: NEGATIVE
Comment: NEGATIVE
Comment: NORMAL
Diagnosis: NEGATIVE
High risk HPV: NEGATIVE
Neisseria Gonorrhea: NEGATIVE
Trichomonas: NEGATIVE

## 2020-04-28 DIAGNOSIS — Z20828 Contact with and (suspected) exposure to other viral communicable diseases: Secondary | ICD-10-CM | POA: Diagnosis not present

## 2020-05-01 ENCOUNTER — Encounter: Payer: Self-pay | Admitting: Obstetrics and Gynecology

## 2020-05-01 ENCOUNTER — Telehealth: Payer: Self-pay | Admitting: Obstetrics and Gynecology

## 2020-05-01 NOTE — Telephone Encounter (Signed)
GYN Telephone  Negative embx and pap results d/w her. Reviewed with her re: various procedures. She also has what sounds like menstrual migraines and she is wondering if surgery would help this. I told her that it may but I wouldn't anticipate it unless she had surgery where the ovaries were removed. pt used hormones in the past and did not like how it made her feel. Given this, she'd like to do ablation since periods and bleeding are more concerning to her.   Request sent for hysteroscopy, novasure endometrial ablation. U/s reviewed again and fibroids don't appear to distort the cavity. She is retroverted.  Robin Romans MD Attending Center for [employer] [facility] (Faculty Practice) 05/01/2020 Time: [phone]

## 2020-05-04 ENCOUNTER — Ambulatory Visit (INDEPENDENT_AMBULATORY_CARE_PROVIDER_SITE_OTHER): Payer: BC Managed Care – PPO

## 2020-05-04 DIAGNOSIS — J309 Allergic rhinitis, unspecified: Secondary | ICD-10-CM | POA: Diagnosis not present

## 2020-05-12 DIAGNOSIS — F902 Attention-deficit hyperactivity disorder, combined type: Secondary | ICD-10-CM | POA: Diagnosis not present

## 2020-05-12 DIAGNOSIS — F411 Generalized anxiety disorder: Secondary | ICD-10-CM | POA: Diagnosis not present

## 2020-05-12 DIAGNOSIS — F339 Major depressive disorder, recurrent, unspecified: Secondary | ICD-10-CM | POA: Diagnosis not present

## 2020-05-14 DIAGNOSIS — F339 Major depressive disorder, recurrent, unspecified: Secondary | ICD-10-CM | POA: Diagnosis not present

## 2020-05-15 ENCOUNTER — Encounter: Payer: Self-pay | Admitting: Rheumatology

## 2020-05-15 ENCOUNTER — Ambulatory Visit (INDEPENDENT_AMBULATORY_CARE_PROVIDER_SITE_OTHER): Payer: BC Managed Care – PPO

## 2020-05-15 ENCOUNTER — Other Ambulatory Visit: Payer: Self-pay | Admitting: Obstetrics and Gynecology

## 2020-05-15 DIAGNOSIS — J309 Allergic rhinitis, unspecified: Secondary | ICD-10-CM | POA: Diagnosis not present

## 2020-05-20 ENCOUNTER — Other Ambulatory Visit: Payer: Self-pay | Admitting: Rheumatology

## 2020-05-20 DIAGNOSIS — M3219 Other organ or system involvement in systemic lupus erythematosus: Secondary | ICD-10-CM

## 2020-05-20 NOTE — Telephone Encounter (Signed)
Attempted to contact the patient and left message for patient to call the office.  

## 2020-05-20 NOTE — Telephone Encounter (Signed)
Last Visit: 03/10/2020 Next Visit: due November 2021. Message sent to the front to schedule patient  Labs: 02/20/2020 Alk Phos 36 Eye exam: 02/16/18 WNL   Current Dose per office note 03/10/2020: Plaquenil 200 mg 1 tablet by mouth twice daily M-F DX: Other systemic lupus erythematosus with other organ involvement  Patient advised she is due to update PLQ eye exam and states she has an eye exam scheduled for 05/29/2020  Okay to refill Plaquenil?

## 2020-05-20 NOTE — Telephone Encounter (Signed)
Please clarify how the patient is taking plaquenil.

## 2020-05-22 DIAGNOSIS — J309 Allergic rhinitis, unspecified: Secondary | ICD-10-CM | POA: Diagnosis not present

## 2020-05-22 NOTE — Telephone Encounter (Signed)
Patient states she is taking PLQ BID Monday

## 2020-05-25 ENCOUNTER — Encounter: Payer: Self-pay | Admitting: Allergy & Immunology

## 2020-05-27 ENCOUNTER — Encounter (HOSPITAL_BASED_OUTPATIENT_CLINIC_OR_DEPARTMENT_OTHER): Payer: Self-pay | Admitting: Obstetrics and Gynecology

## 2020-05-27 ENCOUNTER — Other Ambulatory Visit: Payer: Self-pay

## 2020-05-28 ENCOUNTER — Ambulatory Visit (INDEPENDENT_AMBULATORY_CARE_PROVIDER_SITE_OTHER): Payer: BC Managed Care – PPO | Admitting: *Deleted

## 2020-05-28 DIAGNOSIS — F339 Major depressive disorder, recurrent, unspecified: Secondary | ICD-10-CM | POA: Diagnosis not present

## 2020-05-28 DIAGNOSIS — J309 Allergic rhinitis, unspecified: Secondary | ICD-10-CM | POA: Diagnosis not present

## 2020-05-29 DIAGNOSIS — Z79899 Other long term (current) drug therapy: Secondary | ICD-10-CM | POA: Diagnosis not present

## 2020-06-01 ENCOUNTER — Encounter (HOSPITAL_BASED_OUTPATIENT_CLINIC_OR_DEPARTMENT_OTHER)
Admission: RE | Admit: 2020-06-01 | Discharge: 2020-06-01 | Disposition: A | Discharge status: Home / Self Care | Payer: BC Managed Care – PPO | Source: Ambulatory Visit | Attending: Obstetrics and Gynecology | Admitting: Obstetrics and Gynecology

## 2020-06-01 ENCOUNTER — Other Ambulatory Visit (HOSPITAL_COMMUNITY)
Admission: RE | Admit: 2020-06-01 | Discharge: 2020-06-01 | Disposition: A | Discharge status: Home / Self Care | Payer: BC Managed Care – PPO | Source: Ambulatory Visit | Attending: Obstetrics and Gynecology | Admitting: Obstetrics and Gynecology

## 2020-06-01 ENCOUNTER — Ambulatory Visit (INDEPENDENT_AMBULATORY_CARE_PROVIDER_SITE_OTHER): Payer: BC Managed Care – PPO

## 2020-06-01 DIAGNOSIS — J309 Allergic rhinitis, unspecified: Secondary | ICD-10-CM

## 2020-06-01 DIAGNOSIS — Z87891 Personal history of nicotine dependence: Secondary | ICD-10-CM | POA: Diagnosis not present

## 2020-06-01 DIAGNOSIS — Z01810 Encounter for preprocedural cardiovascular examination: Secondary | ICD-10-CM | POA: Insufficient documentation

## 2020-06-01 DIAGNOSIS — F329 Major depressive disorder, single episode, unspecified: Secondary | ICD-10-CM | POA: Diagnosis not present

## 2020-06-01 DIAGNOSIS — Z79899 Other long term (current) drug therapy: Secondary | ICD-10-CM | POA: Diagnosis not present

## 2020-06-01 DIAGNOSIS — Z20822 Contact with and (suspected) exposure to covid-19: Secondary | ICD-10-CM | POA: Diagnosis not present

## 2020-06-01 DIAGNOSIS — E785 Hyperlipidemia, unspecified: Secondary | ICD-10-CM | POA: Diagnosis not present

## 2020-06-01 DIAGNOSIS — Z01812 Encounter for preprocedural laboratory examination: Secondary | ICD-10-CM | POA: Diagnosis not present

## 2020-06-01 DIAGNOSIS — N939 Abnormal uterine and vaginal bleeding, unspecified: Secondary | ICD-10-CM | POA: Diagnosis not present

## 2020-06-01 DIAGNOSIS — J449 Chronic obstructive pulmonary disease, unspecified: Secondary | ICD-10-CM | POA: Diagnosis not present

## 2020-06-01 DIAGNOSIS — M329 Systemic lupus erythematosus, unspecified: Secondary | ICD-10-CM | POA: Diagnosis not present

## 2020-06-01 DIAGNOSIS — F419 Anxiety disorder, unspecified: Secondary | ICD-10-CM | POA: Diagnosis not present

## 2020-06-01 LAB — CBC
HCT: 36.5 % (ref 36.0–46.0)
Hemoglobin: 12.1 g/dL (ref 12.0–15.0)
MCH: 30.7 pg (ref 26.0–34.0)
MCHC: 33.2 g/dL (ref 30.0–36.0)
MCV: 92.6 fL (ref 80.0–100.0)
Platelets: 217 10*3/uL (ref 150–400)
RBC: 3.94 MIL/uL (ref 3.87–5.11)
RDW: 12.8 % (ref 11.5–15.5)
WBC: 4.2 10*3/uL (ref 4.0–10.5)
nRBC: 0 % (ref 0.0–0.2)

## 2020-06-01 LAB — COMPREHENSIVE METABOLIC PANEL
ALT: 15 U/L (ref 0–44)
AST: 20 U/L (ref 15–41)
Albumin: 3.8 g/dL (ref 3.5–5.0)
Alkaline Phosphatase: 26 U/L — ABNORMAL LOW (ref 38–126)
Anion gap: 7 (ref 5–15)
BUN: 12 mg/dL (ref 6–20)
CO2: 26 mmol/L (ref 22–32)
Calcium: 9 mg/dL (ref 8.9–10.3)
Chloride: 103 mmol/L (ref 98–111)
Creatinine, Ser: 0.86 mg/dL (ref 0.44–1.00)
GFR calc Af Amer: >60 mL/min (ref >60)
GFR calc non Af Amer: >60 mL/min (ref >60)
Glucose, Bld: 89 mg/dL (ref 70–99)
Potassium: 4.2 mmol/L (ref 3.5–5.1)
Sodium: 136 mmol/L (ref 135–145)
Total Bilirubin: 0.8 mg/dL (ref 0.3–1.2)
Total Protein: 6.3 g/dL — ABNORMAL LOW (ref 6.5–8.1)

## 2020-06-01 LAB — TYPE AND SCREEN
ABO/RH(D): A NEG
Antibody Screen: NEGATIVE

## 2020-06-01 LAB — POCT PREGNANCY, URINE: Preg Test, Ur: NEGATIVE

## 2020-06-01 LAB — SARS CORONAVIRUS 2 (TAT 6-24 HRS): SARS Coronavirus 2: NEGATIVE

## 2020-06-03 ENCOUNTER — Ambulatory Visit (HOSPITAL_BASED_OUTPATIENT_CLINIC_OR_DEPARTMENT_OTHER): Payer: BC Managed Care – PPO | Admitting: Anesthesiology

## 2020-06-03 ENCOUNTER — Other Ambulatory Visit: Payer: Self-pay

## 2020-06-03 ENCOUNTER — Encounter (HOSPITAL_BASED_OUTPATIENT_CLINIC_OR_DEPARTMENT_OTHER)
Admission: RE | Disposition: A | Discharge status: Home / Self Care | Payer: Self-pay | Source: Home / Self Care | Attending: Obstetrics and Gynecology

## 2020-06-03 ENCOUNTER — Ambulatory Visit (HOSPITAL_BASED_OUTPATIENT_CLINIC_OR_DEPARTMENT_OTHER)
Admission: RE | Admit: 2020-06-03 | Discharge: 2020-06-03 | Disposition: A | Discharge status: Home / Self Care | Payer: BC Managed Care – PPO | Attending: Obstetrics and Gynecology | Admitting: Obstetrics and Gynecology

## 2020-06-03 ENCOUNTER — Encounter (HOSPITAL_BASED_OUTPATIENT_CLINIC_OR_DEPARTMENT_OTHER): Payer: Self-pay | Admitting: Obstetrics and Gynecology

## 2020-06-03 DIAGNOSIS — F419 Anxiety disorder, unspecified: Secondary | ICD-10-CM | POA: Insufficient documentation

## 2020-06-03 DIAGNOSIS — J449 Chronic obstructive pulmonary disease, unspecified: Secondary | ICD-10-CM | POA: Diagnosis not present

## 2020-06-03 DIAGNOSIS — M329 Systemic lupus erythematosus, unspecified: Secondary | ICD-10-CM | POA: Diagnosis not present

## 2020-06-03 DIAGNOSIS — F329 Major depressive disorder, single episode, unspecified: Secondary | ICD-10-CM | POA: Insufficient documentation

## 2020-06-03 DIAGNOSIS — Z9889 Other specified postprocedural states: Secondary | ICD-10-CM

## 2020-06-03 DIAGNOSIS — F418 Other specified anxiety disorders: Secondary | ICD-10-CM | POA: Diagnosis not present

## 2020-06-03 DIAGNOSIS — N939 Abnormal uterine and vaginal bleeding, unspecified: Secondary | ICD-10-CM | POA: Diagnosis not present

## 2020-06-03 DIAGNOSIS — Z87891 Personal history of nicotine dependence: Secondary | ICD-10-CM | POA: Insufficient documentation

## 2020-06-03 DIAGNOSIS — E785 Hyperlipidemia, unspecified: Secondary | ICD-10-CM | POA: Insufficient documentation

## 2020-06-03 DIAGNOSIS — Z79899 Other long term (current) drug therapy: Secondary | ICD-10-CM | POA: Insufficient documentation

## 2020-06-03 HISTORY — PX: DILITATION & CURRETTAGE/HYSTROSCOPY WITH NOVASURE ABLATION: SHX5568

## 2020-06-03 HISTORY — DX: Nausea with vomiting, unspecified: R11.2

## 2020-06-03 HISTORY — DX: Other specified postprocedural states: Z98.890

## 2020-06-03 HISTORY — DX: Depression, unspecified: F32.A

## 2020-06-03 HISTORY — DX: Raynaud's syndrome without gangrene: I73.00

## 2020-06-03 LAB — ABO/RH: ABO/RH(D): A NEG

## 2020-06-03 SURGERY — DILATATION & CURETTAGE/HYSTEROSCOPY WITH NOVASURE ABLATION
Anesthesia: General | Site: Vagina

## 2020-06-03 MED ORDER — LIDOCAINE HCL 1 % IJ SOLN
INTRAMUSCULAR | Status: DC | PRN
  Administered 2020-06-03: 20 mL

## 2020-06-03 MED ORDER — SCOPOLAMINE 1 MG/3DAYS TD PT72
1.0000 | MEDICATED_PATCH | TRANSDERMAL | Status: DC
  Administered 2020-06-03: 1.5 mg via TRANSDERMAL

## 2020-06-03 MED ORDER — MIDAZOLAM HCL 2 MG/2ML IJ SOLN: 0.5000 mg | Freq: Once | INTRAMUSCULAR | Status: DC | PRN

## 2020-06-03 MED ORDER — ONDANSETRON HCL 4 MG/2ML IJ SOLN
INTRAMUSCULAR | Status: DC | PRN
  Administered 2020-06-03: 4 mg via INTRAVENOUS

## 2020-06-03 MED ORDER — SODIUM CHLORIDE 0.9 % IR SOLN
Status: DC | PRN
  Administered 2020-06-03: 3000 mL

## 2020-06-03 MED ORDER — SCOPOLAMINE 1 MG/3DAYS TD PT72
MEDICATED_PATCH | TRANSDERMAL | Status: AC
  Filled 2020-06-03: qty 1

## 2020-06-03 MED ORDER — FENTANYL CITRATE (PF) 100 MCG/2ML IJ SOLN
INTRAMUSCULAR | Status: DC | PRN
Start: 2020-06-03 — End: 2020-06-03
  Administered 2020-06-03: 50 ug via INTRAVENOUS

## 2020-06-03 MED ORDER — ACETAMINOPHEN 500 MG PO TABS
ORAL_TABLET | ORAL | Status: AC
  Filled 2020-06-03: qty 2

## 2020-06-03 MED ORDER — LACTATED RINGERS IV SOLN: INTRAVENOUS | Status: DC

## 2020-06-03 MED ORDER — OXYCODONE-ACETAMINOPHEN 5-325 MG PO TABS: 1.0000 | ORAL_TABLET | Freq: Four times a day (QID) | ORAL | 0 refills | Status: DC | PRN

## 2020-06-03 MED ORDER — LIDOCAINE HCL (PF) 1 % IJ SOLN
INTRAMUSCULAR | Status: AC
  Filled 2020-06-03: qty 30

## 2020-06-03 MED ORDER — FENTANYL CITRATE (PF) 100 MCG/2ML IJ SOLN
25.0000 ug | INTRAMUSCULAR | Status: DC | PRN
  Administered 2020-06-03: 50 ug via INTRAVENOUS

## 2020-06-03 MED ORDER — DEXAMETHASONE SODIUM PHOSPHATE 4 MG/ML IJ SOLN
INTRAMUSCULAR | Status: DC | PRN
  Administered 2020-06-03: 10 mg via INTRAVENOUS

## 2020-06-03 MED ORDER — ACETAMINOPHEN 500 MG PO TABS
1000.0000 mg | ORAL_TABLET | Freq: Once | ORAL | Status: AC
  Administered 2020-06-03: 1000 mg via ORAL

## 2020-06-03 MED ORDER — ONDANSETRON HCL 4 MG/2ML IJ SOLN
INTRAMUSCULAR | Status: AC
  Filled 2020-06-03: qty 2

## 2020-06-03 MED ORDER — PROMETHAZINE HCL 25 MG/ML IJ SOLN: 6.2500 mg | INTRAMUSCULAR | Status: DC | PRN

## 2020-06-03 MED ORDER — MIDAZOLAM HCL 2 MG/2ML IJ SOLN
INTRAMUSCULAR | Status: AC
  Filled 2020-06-03: qty 2

## 2020-06-03 MED ORDER — FENTANYL CITRATE (PF) 100 MCG/2ML IJ SOLN
INTRAMUSCULAR | Status: AC
  Filled 2020-06-03: qty 2

## 2020-06-03 MED ORDER — LIDOCAINE 2% (20 MG/ML) 5 ML SYRINGE
INTRAMUSCULAR | Status: DC | PRN
  Administered 2020-06-03: 60 mg via INTRAVENOUS

## 2020-06-03 MED ORDER — KETOROLAC TROMETHAMINE 30 MG/ML IJ SOLN
INTRAMUSCULAR | Status: AC
  Filled 2020-06-03: qty 1

## 2020-06-03 MED ORDER — EPHEDRINE SULFATE-NACL 50-0.9 MG/10ML-% IV SOSY
PREFILLED_SYRINGE | INTRAVENOUS | Status: DC | PRN
  Administered 2020-06-03: 10 mg via INTRAVENOUS

## 2020-06-03 MED ORDER — SILVER NITRATE-POT NITRATE 75-25 % EX MISC
CUTANEOUS | Status: AC
  Filled 2020-06-03: qty 10

## 2020-06-03 MED ORDER — PROPOFOL 10 MG/ML IV BOLUS
INTRAVENOUS | Status: DC | PRN
  Administered 2020-06-03: 150 mg via INTRAVENOUS

## 2020-06-03 MED ORDER — MIDAZOLAM HCL 5 MG/5ML IJ SOLN
INTRAMUSCULAR | Status: DC | PRN
  Administered 2020-06-03: 2 mg via INTRAVENOUS

## 2020-06-03 MED ORDER — MEPERIDINE HCL 25 MG/ML IJ SOLN: 6.2500 mg | INTRAMUSCULAR | Status: DC | PRN

## 2020-06-03 MED ORDER — OXYCODONE HCL 5 MG/5ML PO SOLN: 5.0000 mg | Freq: Once | ORAL | Status: DC | PRN

## 2020-06-03 MED ORDER — POVIDONE-IODINE 10 % EX SWAB
2.0000 "application " | Freq: Once | CUTANEOUS | Status: AC
  Administered 2020-06-03: 2 via TOPICAL

## 2020-06-03 MED ORDER — OXYCODONE HCL 5 MG PO TABS: 5.0000 mg | ORAL_TABLET | Freq: Once | ORAL | Status: DC | PRN

## 2020-06-03 MED ORDER — LIDOCAINE 2% (20 MG/ML) 5 ML SYRINGE
INTRAMUSCULAR | Status: AC
  Filled 2020-06-03: qty 5

## 2020-06-03 SURGICAL SUPPLY — 18 items
ABLATOR SURESOUND NOVASURE (ABLATOR) ×1 IMPLANT
CATH ROBINSON RED A/P 16FR (CATHETERS) ×2 IMPLANT
GAUZE 4X4 16PLY RFD (DISPOSABLE) ×2 IMPLANT
GLOVE BIOGEL PI IND STRL 7.0 (GLOVE) ×1 IMPLANT
GLOVE BIOGEL PI IND STRL 7.5 (GLOVE) ×1 IMPLANT
GLOVE BIOGEL PI INDICATOR 7.0 (GLOVE) ×1
GLOVE BIOGEL PI INDICATOR 7.5 (GLOVE) ×1
GLOVE SURG SS PI 7.0 STRL IVOR (GLOVE) ×2 IMPLANT
GLOVE SURG SS PI 7.5 STRL IVOR (GLOVE) ×3 IMPLANT
GOWN STRL REUS W/TWL 2XL LVL3 (GOWN DISPOSABLE) ×2 IMPLANT
GOWN STRL REUS W/TWL XL LVL3 (GOWN DISPOSABLE) ×2 IMPLANT
HANDPIECE ABLA MINERVA ENDO (MISCELLANEOUS) IMPLANT
KIT PROCEDURE FLUENT (KITS) ×2 IMPLANT
PACK VAGINAL MINOR WOMEN LF (CUSTOM PROCEDURE TRAY) ×2 IMPLANT
PAD OB MATERNITY 4.3X12.25 (PERSONAL CARE ITEMS) ×2 IMPLANT
PAD PREP 24X48 CUFFED NSTRL (MISCELLANEOUS) ×2 IMPLANT
SLEEVE SCD COMPRESS KNEE MED (MISCELLANEOUS) ×2 IMPLANT
TOWEL GREEN STERILE FF (TOWEL DISPOSABLE) ×4 IMPLANT

## 2020-06-03 NOTE — H&P (Signed)
Obstetrics & Gynecology Surgical H&P   Date of Surgery: 06/03/2020    Primary OBGYN: [facility]  Reason for Admission: scheduled endometrial ablation  History of Present Illness: Robin [name] is a 49 y.o. [phone] (Patient's last menstrual period was 05/16/2020.), with the above CC. PMHx is significant for fibroids, lupus, anxiety and depression  Patient with AUB and options d/w her and she elcted for endometrial ablation. Pt currently bleeding.   ROS: A 12-point review of systems was performed and negative, except as stated in the above HPI.  OBGYN History: As per HPI. OB History  Gravida Para Term Preterm AB Living  2 2 2     2   SAB TAB Ectopic Multiple Live Births          2    # Outcome Date GA Lbr Len/2nd Weight Sex Delivery Anes PTL Lv  2 Term           1 Term             Obstetric Comments  c-section x 2     Past Medical History: Past Medical History:  Diagnosis Date  . ADD (attention deficit disorder)   . Allergy   . Anemia   . Anxiety   . Asthma   . Depression   . Hyperlipidemia   . Ileitis   . Lupus ([place])   . Raynaud disease     Past Surgical History: Past Surgical History:  Procedure Laterality Date  . CARPAL TUNNEL RELEASE Right    08/2019  . CESAREAN SECTION     x 2  . DENTAL SURGERY    . ENDOMETRIAL BIOPSY  04/20/2020      . EYE SURGERY Bilateral    cryoretinopexy    Family History:  Family History  Problem Relation Age of Onset  . Heart disease Mother 37       CAD/stenting  . Hyperlipidemia Mother   . Arthritis Mother        ostearthritis  . Hypertension Mother   . Kidney disease Mother   . Irritable bowel syndrome Mother   . Stroke Mother 5       CVA  . Fibromyalgia Mother   . Eczema Mother   . Hyperlipidemia Father   . Neuropathy Father   . Allergies Father        shellfish  . Hyperlipidemia Sister   . Hypertension Brother   . Colon cancer Neg Hx   . Inflammatory bowel disease Neg Hx    . Allergic rhinitis Neg Hx   . Angioedema Neg Hx   . Atopy Neg Hx   . Asthma Neg Hx   . Immunodeficiency Neg Hx   . Urticaria Neg Hx     Social History:  Social History   Socioeconomic History  . Marital status: Married    Spouse name: Not on file  . Number of children: 2  . Years of education: 9  . Highest education level: Not on file  Occupational History  . Not on file  Tobacco Use  . Smoking status: Former Smoker    Packs/day: 0.75    Years: 10.00    Pack years: 7.50    Types: Cigarettes    Quit date: 07/05/2001    Years since quitting: 18.9  . Smokeless tobacco: Never Used  Vaping Use  . Vaping Use: Never used  Substance and Sexual Activity  . Alcohol use: Yes    Alcohol/week: 14.0 standard drinks  Types: 14 Cans of beer per week    Comment: 2 beer daily  . Drug use: Never  . Sexual activity: Yes    Birth control/protection: Condom  Other Topics Concern  . Not on file  Social History Narrative   Marital status: married x 17 years; happily married      Children: 2 children (93, 50)      Lives: with husband, 2 children.      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 Difficulty of Paying Living Expenses: Not on file  Food Insecurity: No Food Insecurity  . Worried About Charity fundraiser in the Last Year: Never true  . Ran Out of Food in the Last Year: Never true  Transportation Needs: No Transportation Needs  . Lack of Transportation (Medical): No  . Lack of Transportation (Non-Medical): No  Physical Activity:   . Days of Exercise per Week: Not on file  . Minutes of Exercise per Session: Not on file  Stress:   . Feeling of Stress : Not on file  Social Connections:   . Frequency of Communication with Friends and Family: Not  on file  . Frequency of Social Gatherings with Friends and Family: Not on file  . Attends Religious Services: Not on file  . Active Member of Clubs or Organizations: Not on file  . Attends Archivist Meetings: Not on file  . Marital Status: Not on file  Intimate Partner Violence:   . Fear of Current or Ex-Partner: Not on file  . Emotionally Abused: Not on file  . Physically Abused: Not on file  . Sexually Abused: Not on file    Allergy: Allergies  Allergen Reactions  . Avelox [Moxifloxacin Hcl In Nacl] Other (See Comments)    Muscle cramps  . Cefaclor Hives    Tolerates penicillins  . Dust Mite Extract   . Latex Other (See Comments)    "Raw skin"  . Mold Extract [Trichophyton]     & cats   . Red Wine Complex [Germanium]     Itching   . Meloxicam Rash  . Naproxen Rash    Current Outpatient Medications: Facility-Administered Medications Prior to Admission  Medication Dose Route Frequency Provider Last Rate Last Admin  . magic mouthwash  5 mL Oral TID Bo Merino, MD       Medications Prior to Admission  Medication Sig Dispense Refill Last Dose  . acetaminophen (TYLENOL) 500 MG tablet Take 1,000 mg by mouth every 6 (six) hours as needed for mild pain.   Past Week at Unknown time  . albuterol (PROVENTIL HFA;VENTOLIN HFA) 108 (90 Base) MCG/ACT inhaler Inhale 2 puffs into the lungs every 6 (six) hours as needed for wheezing or shortness of breath. 1 Inhaler 0 Past Week at Unknown time  . amphetamine-dextroamphetamine (ADDERALL) 10 MG tablet Take 10 mg once a day to twice daily. (Patient taking differently: Take 20 mg by mouth daily with breakfast. Take 10 mg once a day to twice daily.) 60 tablet 0 05/27/2020 at Unknown time  . buPROPion (WELLBUTRIN XL) 300 MG 24 hr tablet    05/27/2020 at Unknown time  . DiphenhydrAMINE HCl (BENADRYL ALLERGY PO)  Take by mouth as needed.   Past Week at Unknown time  . Fexofenadine HCl (ALLEGRA PO) Take by mouth daily.   05/27/2020 at  Unknown time  . FLUoxetine (PROZAC) 10 MG capsule Take 10 mg by mouth every morning.   05/27/2020 at Unknown time  . Melatonin 5 MG CAPS Take by mouth.   05/26/2020 at Unknown time  . montelukast (SINGULAIR) 10 MG tablet TAKE 1 TABLET(10 MG) BY MOUTH AT BEDTIME 30 tablet 3 05/26/2020 at Unknown time  . Omega 3-6-9 Fatty Acids (OMEGA-3-6-9 PO) Take 1 capsule by mouth daily.    05/26/2020 at Unknown time  . sodium chloride (OCEAN) 0.65 % nasal spray Place 1 spray into the nose daily as needed for congestion.    05/26/2020 at Unknown time  . UNABLE TO FIND once a week. Med Name: allergy shots   Past Week at Unknown time  . VITAMIN D, CHOLECALCIFEROL, PO Take 500 mg by mouth.   05/26/2020 at Unknown time  . azelastine (ASTELIN) 0.1 % nasal spray Place 2 sprays into both nostrils 2 (two) times daily as needed for rhinitis. PLACE 2 SPRAYS INTO BOTH NOSTRILS TWICE DAILY 30 mL 5 More than a month at Unknown time  . EPINEPHrine 0.3 mg/0.3 mL IJ SOAJ injection Inject 0.3 mLs (0.3 mg total) into the muscle as needed for anaphylaxis. 2 each 1 Unknown at Unknown time  . hydroxychloroquine (PLAQUENIL) 200 MG tablet TAKE 1 TABLET BY MOUTH TWICE DAILY Monday-Friday ONLY 120 tablet 0      Hospital Medications: Current Facility-Administered Medications  Medication Dose Route Frequency Provider Last Rate Last Admin  . acetaminophen (TYLENOL) tablet 1,000 mg  1,000 mg Oral Once Annye Asa, MD      . lactated ringers infusion   Intravenous Continuous Aletha Halim, MD      . lactated ringers infusion   Intravenous Continuous Aletha Halim, MD      . lactated ringers infusion   Intravenous Continuous Lynda Rainwater, MD      . povidone-iodine 10 % swab 2 application  2 application Topical Once Aletha Halim, MD      . scopolamine (TRANSDERM-SCOP) 1 MG/3DAYS 1.5 mg  1 patch Transdermal Q72H Annye Asa, MD         Physical Exam: VS pending  Body mass index is 24.53 kg/m. General  appearance: Well nourished, well developed female in no acute distress.  Cardiovascular: S1, S2 normal, no murmur, rub or gallop, regular rate and rhythm Respiratory:  Clear to auscultation bilateral. Normal respiratory effort Abdomen: positive bowel sounds and no masses, hernias; diffusely non tender to palpation, non distended Neuro/Psych:  Normal mood and affect.  Skin:  Warm and dry.  Extremities: no clubbing, cyanosis, or edema.   Laboratory: UPT: neg COVID: neg Recent Labs  Lab 06/01/20 1000  WBC 4.2  HGB 12.1  HCT 36.5  PLT 217   Recent Labs  Lab 06/01/20 1000  NA 136  K 4.2  CL 103  CO2 26  BUN 12  CREATININE 0.86  CALCIUM 9.0  PROT 6.3*  BILITOT 0.8  ALKPHOS 26*  ALT 15  AST 20  GLUCOSE 89   No results for input(s): APTT, INR, PTT in the last 168 hours.  Invalid input(s): DRHAPTT Recent Labs  Lab 06/01/20 0954  Friant A NEG    Imaging:  Narrative & Impression  CLINICAL DATA: Abnormal bleeding with pain, heavy frequent cycles every other week, history of Caesarean section, uterine fibroids; LMP 02/17/2020  EXAM: TRANSABDOMINAL AND TRANSVAGINAL ULTRASOUND OF PELVIS  TECHNIQUE: Both transabdominal and transvaginal ultrasound examinations of the pelvis were performed. Transabdominal technique was performed for global imaging of the pelvis including uterus, ovaries, adnexal regions, and pelvic cul-de-sac. It was necessary to proceed with endovaginal exam following the transabdominal exam to visualize the ovaries.  COMPARISON: 10/05/2018  FINDINGS: Uterus  Measurements: 10.8 x 5.4 x 5.5 cm = volume: 167 mL. Retroverted. Heterogeneous myometrium. Multiple uterine leiomyomata are identified, all subserosal or intramural, largest lesions measuring 2.9 cm, 2.8 cm, and 2.8 cm in greatest sizes. No definite submucosal leiomyomata are identified. Nabothian cyst at cervix.  Endometrium  Thickness: 10 mm. No endometrial fluid or focal  abnormality  Right ovary  Measurements: 3.2 x 2.9 x 2.0 cm = volume: 9.5 mL. Dominant follicle without mass  Left ovary  Measurements: 3.9 x 2.4 x 2.2 cm = volume: 11.2 mL. Dominant follicle. Additional small corpus luteum. No additional masses.  Other findings  Small amount of nonspecific free pelvic fluid. No adnexal masses.  IMPRESSION: Multiple uterine leiomyomata, all subserosal to intramural without definite submucosal extension.  Remainder of exam unremarkable.   Electronically Signed By: Lavonia Dana M.D. On: 03/05/2020 18:04     Assessment: Ms. Grindle is a 49 y.o. [phone] (Patient's last menstrual period was 05/16/2020.) here for endometrial ablation.  Plan: D/w her re: plan for hysteroscopy and endometrial ablation. All questions asked and answered. Can proceed when OR is ready   [name] MD Attending [facility] (Faculty Practice) [phone]

## 2020-06-03 NOTE — Anesthesia Preprocedure Evaluation (Addendum)
Anesthesia Evaluation  Patient identified by MRN, date of birth, ID band Patient awake    Reviewed: Allergy & Precautions, NPO status , Patient's Chart, lab work & pertinent test results  History of Anesthesia Complications (+) PONV  Airway Mallampati: II  TM Distance: >3 FB Neck ROM: Full    Dental  (+) Dental Advisory Given, Teeth Intact   Pulmonary COPD,  COPD inhaler, former smoker,  06/01/2020 SARS coronavirus NEG   breath sounds clear to auscultation       Cardiovascular negative cardio ROS   Rhythm:Regular Rate:Normal     Neuro/Psych PSYCHIATRIC DISORDERS (ADD) Anxiety Depression negative neurological ROS     GI/Hepatic negative GI ROS, Neg liver ROS,   Endo/Other  Lupus Raynaud's  Renal/GU negative Renal ROS     Musculoskeletal   Abdominal   Peds  Hematology negative hematology ROS (+)   Anesthesia Other Findings   Reproductive/Obstetrics                            Anesthesia Physical Anesthesia Plan  ASA: II  Anesthesia Plan: General   Post-op Pain Management:    Induction: Intravenous  PONV Risk Score and Plan: 3 and Ondansetron, Dexamethasone and Scopolamine patch - Pre-op  Airway Management Planned: LMA  Additional Equipment: None  Intra-op Plan:   Post-operative Plan:   Informed Consent: I have reviewed the patients History and Physical, chart, labs and discussed the procedure including the risks, benefits and alternatives for the proposed anesthesia with the patient or authorized representative who has indicated his/her understanding and acceptance.     Dental advisory given  Plan Discussed with: CRNA and Surgeon  Anesthesia Plan Comments:        Anesthesia Quick Evaluation

## 2020-06-03 NOTE — Discharge Instructions (Addendum)
Endometrial Ablation, Care After This sheet gives you information about how to care for yourself after your procedure. Your health care provider may also give you more specific instructions. If you have problems or questions, contact your health care provider. What can I expect after the procedure? After the procedure, it is common to have:  A need to urinate more frequently than usual for the first 24 hours.  Cramps similar to menstrual cramps. These may last for 1-2 days.  Thin, watery vaginal discharge that is light pink or brown in color. This may last a few weeks. Discharge will be heavy for the first few days after your procedure. You may need to wear a sanitary pad.  Nausea.  Vaginal bleeding for 4-6 weeks after the procedure, as tissue healing occurs. Follow these instructions at home: Activity  Do not drive for 24 hours if you were given amedicine to help you relax (sedative) during your procedure.  Do not have sex or put anything into your vagina until your health care provider approves.  Do not lift anything that is heavier than 10 lb (4.5 kg), or the limit that you are told, until your health care provider says that it is safe.  Return to your normal activities as told by your health care provider. Ask your health care provider what activities are safe for you. General instructions   Take over-the-counter and prescription medicines only as told by your health care provider.  Do not take baths, swim, or use a hot tub until your health care provider approves. You will be able to take showers.  Check your vaginal area every day for signs of infection. Check for: ? Redness, swelling, or pain. ? More discharge or blood, instead of less. ? Bad-smelling discharge.  Keep all follow-up visits as told by your health care provider. This is important.  Drink enough fluid to keep your urine pale yellow. Contact a health care provider if you have:  Vaginal redness, swelling, or  pain.  Vaginal discharge or bleeding that gets worse instead of getting better.  Bad-smelling vaginal discharge.  A fever or chills.  Trouble urinating. Get help right away if you have:  Heavy vaginal bleeding.  Severe cramps. Summary  After endometrial ablation, it is normal to have thin, watery vaginal discharge that is light pink or brown in color. This may last a few weeks and may be heavier right after the procedure.  Vaginal bleeding is also normal after the procedure and should get better with time.  Check your vaginal area every day for signs of infection, such as bad-smelling discharge.  Keep all follow-up visits as told by your health care provider. This is important. This information is not intended to replace advice given to you by your health care provider. Make sure you discuss any questions you have with your health care provider. Document Revised: 12/20/2018 Document Reviewed: 07/11/2017 Elsevier Patient Education  [place] Instructions  Activity: Get plenty of rest for the remainder of the day. A responsible individual must stay with you for 24 hours following the procedure.  For the next 24 hours, DO NOT: -Drive a car -Paediatric nurse -Drink alcoholic beverages -Take any medication unless instructed by your physician -Make any legal decisions or sign important papers.  Meals: Start with liquid foods such as gelatin or soup. Progress to regular foods as tolerated. Avoid greasy, spicy, heavy foods. If nausea and/or vomiting occur, drink only clear liquids until the  nausea and/or vomiting subsides. Call your physician if vomiting continues.  Special Instructions/Symptoms: Your throat may feel dry or sore from the anesthesia or the breathing tube placed in your throat during surgery. If this causes discomfort, gargle with warm salt water. The discomfort should disappear within 24 hours.  If you had a scopolamine patch  placed behind your ear for the management of post- operative nausea and/or vomiting:  1. The medication in the patch is effective for 72 hours, after which it should be removed.  Wrap patch in a tissue and discard in the trash. Wash hands thoroughly with soap and water. 2. You may remove the patch earlier than 72 hours if you experience unpleasant side effects which may include dry mouth, dizziness or visual disturbances. 3. Avoid touching the patch. Wash your hands with soap and water after contact with the patch.  Do not take Tylenol until 7:00pm if needed.

## 2020-06-03 NOTE — Op Note (Signed)
Operative Note   06/03/2020  PRE-OP DIAGNOSIS: Abnormal uterine bleeding   POST-OP DIAGNOSIS: Same  SURGEON: Surgeon(s) and Role:    * Aletha Halim, MD - Primary  ASSISTANT: None  PROCEDURE:  Hysteroscopy, Novasure endometrial ablation  ANESTHESIA: General and paracervical block  ESTIMATED BLOOD LOSS: 27m  DRAINS: patient voided just prior to surgery  TOTAL IV FLUIDS: per anesthesia note  SPECIMENS: None  VTE PROPHYLAXIS: SCDs to the bilateral lower extremities  ANTIBIOTICS: Not indicated  FLUID DEFICIT: 1161WR COMPLICATIONS: none  DISPOSITION: PACU - hemodynamically stable.  CONDITION: stable   FINDINGS: Exam under anesthesia showed normal EGBUS, vagina and cervix with old blood clots seen after cervical dilation. On hysteroscopy, normal cervical canal. It was difficult to fully see the endometrial cavity due to the bleeding but walls appeared normal with no obvious submucosal fibroids or polyps noted. After ablation, right tubal ostia seen (normal) and ablation appeared to burn about 95% of the cavity with some parts just adjacent to the cornua not burned.   PROCEDURE IN DETAIL:  After informed consent was obtained, the patient was taken to the operating room where anesthesia was obtained without difficulty. The patient was positioned in the dorsal lithotomy position in [place] The patient was examined under anesthesia, with the above noted findings.  The bi-valved speculum was placed inside the patient's vagina, and the the posterior lip of the cervix was seen and grasped with the tenaculum.  A paracervical block was achieved with 258mof 1% lidocaine and then the sure sound device was used and the cavity was 6.5cm.  Then the cervix was progressively dilated to a 19 French-Pratt dilator.  Hysteroscope introduced and then removed. Novasure device introduced and cavity width was 3.2cm. After passing all pre-ablation checkpoints the Novasure was activated for 65  seconds at [address]. It was then removed and hysteroscopy done again with the above findings.   Excellent hemostasis was noted, and all instruments were removed. She was then taken out of dorsal lithotomy. The patient tolerated the procedure well.  Sponge, lap and instrument counts were correct x2.  The patient was taken to recovery room in excellent condition.  ChDurene RomansD Attending Center for [employer] [employer]

## 2020-06-03 NOTE — Brief Op Note (Signed)
06/03/2020  1:40 PM  PATIENT:  Robin [name]  49 y.o. female  PRE-OPERATIVE DIAGNOSIS:  AUB  POST-OPERATIVE DIAGNOSIS:  AUB  PROCEDURE:  Hysteroscopy, novasure endometrial ablation  SURGEON:  Surgeon(s) and Role:    * Aletha Halim, MD - Primary   ANESTHESIA:   general and paracervical block  EBL:  5 mL   BLOOD ADMINISTERED:none  DRAINS: none   LOCAL MEDICATIONS USED:  LIDOCAINE   SPECIMEN:  No Specimen  DISPOSITION OF SPECIMEN:  N/A  COUNTS:  YES  TOURNIQUET:  * No tourniquets in log *  DICTATION: .Note written in EPIC  PLAN OF CARE: Discharge to home after PACU  PATIENT DISPOSITION:  PACU - hemodynamically stable.   Delay start of Pharmacological VTE agent (>24hrs) due to surgical blood loss or risk of bleeding: not applicable  [name] MD Attending [facility] (Faculty Practice) 06/03/2020 Time: 0856

## 2020-06-03 NOTE — Anesthesia Postprocedure Evaluation (Signed)
Anesthesia Post Note  Patient: CALLE SCHADER  Procedure(s) Performed: DILATATION & CURETTAGE/HYSTEROSCOPY WITH NOVASURE ABLATION (N/A Vagina )     Patient location during evaluation: PACU Anesthesia Type: General Level of consciousness: awake and alert Pain management: pain level controlled Vital Signs Assessment: post-procedure vital signs reviewed and stable Respiratory status: spontaneous breathing, nonlabored ventilation and respiratory function stable Cardiovascular status: blood pressure returned to baseline and stable Postop Assessment: no apparent nausea or vomiting Anesthetic complications: no   No complications documented.  Last Vitals:  Vitals:   06/03/20 1415 06/03/20 1441  BP: 114/75 118/90  Pulse: 72 67  Resp: 17 18  Temp:  36.8 C  SpO2: 100% 100%    Last Pain:  Vitals:   06/03/20 1441  TempSrc:   PainSc: 2                  Lynda Rainwater

## 2020-06-03 NOTE — Anesthesia Procedure Notes (Signed)
Procedure Name: LMA Insertion Date/Time: 06/03/2020 1:12 PM Performed by: Eulas Post, Brodie Scovell W, CRNA Pre-anesthesia Checklist: Patient identified, Emergency Drugs available, Suction available and Patient being monitored Patient Re-evaluated:Patient Re-evaluated prior to induction Oxygen Delivery Method: Circle system utilized Preoxygenation: Pre-oxygenation with 100% oxygen Induction Type: IV induction Ventilation: Mask ventilation without difficulty LMA: LMA inserted LMA Size: 4.0 Number of attempts: 1 Placement Confirmation: positive ETCO2 and breath sounds checked- equal and bilateral Tube secured with: Tape Dental Injury: Teeth and Oropharynx as per pre-operative assessment

## 2020-06-03 NOTE — Transfer of Care (Signed)
Immediate Anesthesia Transfer of Care Note  Patient: Robin [name]  Procedure(s) Performed: DILATATION & CURETTAGE/HYSTEROSCOPY WITH NOVASURE ABLATION (N/A Vagina )  Patient Location: PACU  Anesthesia Type:General  Level of Consciousness: awake and alert   Airway & Oxygen Therapy: Patient Spontanous Breathing and Patient connected to face mask oxygen  Post-op Assessment: Report given to RN and Post -op Vital signs reviewed and stable  Post vital signs: Reviewed and stable  Last Vitals:  Vitals Value Taken Time  BP    Temp    Pulse 86 06/03/20 1348  Resp 8 06/03/20 1348  SpO2 100 % 06/03/20 1348  Vitals shown include unvalidated device data.  Last Pain:  Vitals:   06/03/20 1239  TempSrc: Oral  PainSc: 6       Patients Stated Pain Goal: 5 (62/22/97 9892)  Complications: No complications documented.

## 2020-06-05 ENCOUNTER — Encounter (HOSPITAL_BASED_OUTPATIENT_CLINIC_OR_DEPARTMENT_OTHER): Payer: Self-pay | Admitting: Obstetrics and Gynecology

## 2020-06-10 ENCOUNTER — Ambulatory Visit (INDEPENDENT_AMBULATORY_CARE_PROVIDER_SITE_OTHER): Payer: BC Managed Care – PPO

## 2020-06-10 DIAGNOSIS — J309 Allergic rhinitis, unspecified: Secondary | ICD-10-CM | POA: Diagnosis not present

## 2020-06-19 ENCOUNTER — Ambulatory Visit (INDEPENDENT_AMBULATORY_CARE_PROVIDER_SITE_OTHER): Payer: BC Managed Care – PPO

## 2020-06-19 DIAGNOSIS — J309 Allergic rhinitis, unspecified: Secondary | ICD-10-CM

## 2020-06-25 ENCOUNTER — Other Ambulatory Visit: Payer: Self-pay | Admitting: Physician Assistant

## 2020-06-25 ENCOUNTER — Other Ambulatory Visit: Payer: Self-pay | Admitting: Allergy & Immunology

## 2020-06-25 ENCOUNTER — Ambulatory Visit (INDEPENDENT_AMBULATORY_CARE_PROVIDER_SITE_OTHER): Payer: BC Managed Care – PPO | Admitting: Obstetrics and Gynecology

## 2020-06-25 ENCOUNTER — Encounter: Payer: Self-pay | Admitting: Obstetrics and Gynecology

## 2020-06-25 ENCOUNTER — Other Ambulatory Visit: Payer: Self-pay

## 2020-06-25 VITALS — BP 126/71 | HR 71 | Wt 154.6 lb

## 2020-06-25 DIAGNOSIS — Z09 Encounter for follow-up examination after completed treatment for conditions other than malignant neoplasm: Secondary | ICD-10-CM

## 2020-06-25 DIAGNOSIS — M3219 Other organ or system involvement in systemic lupus erythematosus: Secondary | ICD-10-CM

## 2020-06-25 DIAGNOSIS — F339 Major depressive disorder, recurrent, unspecified: Secondary | ICD-10-CM | POA: Diagnosis not present

## 2020-06-25 NOTE — Progress Notes (Signed)
[facility] 06/25/2020  CC: regular post op visit  Subjective:   s/p 9/22 hysteroscopy, novasure for AUB.   Patient had good post op course and felt fine a few days after the surgery. She just finished a period and it was short and not as intense  Review of Systems Pertinent items noted in HPI and remainder of comprehensive ROS otherwise negative.    Objective:    BP 126/71   Pulse 71   Wt 154 lb 9.6 oz (70.1 kg)   LMP 06/21/2020 (Exact Date)   BMI 24.95 kg/m    NAD  Assessment:    Doing well postoperatively. Operative findings again reviewed.  Plan:   Doing well. No issues. Pt to do Condoms, vasectomy  RTC: 1 year.   [name] MD Attending [facility] [employer])

## 2020-06-26 ENCOUNTER — Ambulatory Visit (INDEPENDENT_AMBULATORY_CARE_PROVIDER_SITE_OTHER): Payer: BC Managed Care – PPO

## 2020-06-26 DIAGNOSIS — J309 Allergic rhinitis, unspecified: Secondary | ICD-10-CM | POA: Diagnosis not present

## 2020-06-29 DIAGNOSIS — Z03818 Encounter for observation for suspected exposure to other biological agents ruled out: Secondary | ICD-10-CM | POA: Diagnosis not present

## 2020-06-30 ENCOUNTER — Ambulatory Visit: Payer: Self-pay

## 2020-06-30 DIAGNOSIS — J309 Allergic rhinitis, unspecified: Secondary | ICD-10-CM | POA: Diagnosis not present

## 2020-07-07 ENCOUNTER — Ambulatory Visit: Payer: BC Managed Care – PPO | Admitting: Allergy & Immunology

## 2020-07-07 ENCOUNTER — Encounter: Payer: Self-pay | Admitting: Allergy & Immunology

## 2020-07-07 ENCOUNTER — Other Ambulatory Visit: Payer: Self-pay

## 2020-07-07 VITALS — BP 112/80 | HR 74 | Resp 14 | Ht 66.0 in | Wt 154.4 lb

## 2020-07-07 DIAGNOSIS — T781XXD Other adverse food reactions, not elsewhere classified, subsequent encounter: Secondary | ICD-10-CM

## 2020-07-07 DIAGNOSIS — J309 Allergic rhinitis, unspecified: Secondary | ICD-10-CM

## 2020-07-07 DIAGNOSIS — J4520 Mild intermittent asthma, uncomplicated: Secondary | ICD-10-CM | POA: Diagnosis not present

## 2020-07-07 DIAGNOSIS — L2089 Other atopic dermatitis: Secondary | ICD-10-CM | POA: Diagnosis not present

## 2020-07-07 DIAGNOSIS — T7800XD Anaphylactic reaction due to unspecified food, subsequent encounter: Secondary | ICD-10-CM | POA: Diagnosis not present

## 2020-07-07 DIAGNOSIS — J3089 Other allergic rhinitis: Secondary | ICD-10-CM | POA: Diagnosis not present

## 2020-07-07 DIAGNOSIS — J302 Other seasonal allergic rhinitis: Secondary | ICD-10-CM

## 2020-07-07 NOTE — Patient Instructions (Addendum)
1. Mild intermittent asthma, uncomplicated - Lung testing deferred today.  - We will not make any medication changes today. - Continue with albuterol every 4 hours as needed.    2. Seasonal and perennial allergic rhinitis (grasses, indoor molds, outdoor molds, dust mites, cat and cockroach) - Continue with an over the counter antihistamine daily.  - Continue with Singulair daily as needed. - Continue with Astelin as needed.  - Continue with allergy shots at the same schedule.    3. Flexural atopic dermatit - Continue with moisturizing twice daily as needed.  4. Return in about 1 year (around 07/07/2021).    Please inform us of any Emergency Department visits, hospitalizations, or changes in symptoms. Call us before going to the ED for breathing or allergy symptoms since we might be able to fit you in for a sick visit. Feel free to contact us anytime with any questions, problems, or concerns.  It was a pleasure to see you and your family again today!  Websites that have reliable patient information: 1. American Academy of Asthma, Allergy, and Immunology: www.aaaai.org 2. Food Allergy Research and Education (FARE): foodallergy.org 3. Mothers of Asthmatics: http://www.asthmacommunitynetwork.org 4. American College of Allergy, Asthma, and Immunology: www.acaai.org   COVID-19 Vaccine Information can be found at: ShippingScam.co.uk For questions related to vaccine distribution or appointments, please email vaccine@[facility] .com or call [phone].     "Like" [place] on Facebook and Instagram for our latest updates!     HAPPY FALL!     Make sure you are registered to vote! If you have moved or changed any of your contact information, you will need to get this updated before voting!  In some cases, you MAY be able to register to vote online: [email or web address]

## 2020-07-07 NOTE — Progress Notes (Signed)
FOLLOW UP  Date of Service/Encounter:  07/07/20   Assessment:   Seasonal and perennial allergic rhinitis(grasses, indoor molds, outdoor molds, dust mites, cat and cockroach) - on allergen immunotherapy  Mild intermittent asthma, uncomplicated  Adverse food reaction(pineapple, banana) - with negative testing   Flexural atopic dermatitis  Plan/Recommendations:   1. Mild intermittent asthma, uncomplicated - Lung testing deferred today.  - We will not make any medication changes today. - Continue with albuterol every 4 hours as needed.    2. Seasonal and perennial allergic rhinitis (grasses, indoor molds, outdoor molds, dust mites, cat and cockroach) - Continue with an over the counter antihistamine daily.  - Continue with Singulair daily as needed. - Continue with Astelin as needed.  - Continue with allergy shots at the same schedule.    3. Flexural atopic dermatit - Continue with moisturizing twice daily as needed.  4. Return in about 1 year (around 07/07/2021).    Subjective:   Robin [name] is a 49 y.o. female presenting today for follow up of  Chief Complaint  Patient presents with  . Allergic Rhinitis     Ok depending on weather, singular doesn't work  . Asthma    No concerns    Robin [name] has a history of the following: Patient Active Problem List   Diagnosis Date Noted  . Seasonal and perennial allergic rhinitis 11/06/2018  . Mild intermittent asthma, uncomplicated 22/29/7989  . Flexural atopic dermatitis 11/06/2018  . History of hyperlipidemia 11/08/2016  . History of anxiety 11/08/2016  . History of retinal tear 11/08/2016  . History of Bilateral plantar fasciitis 08/10/2016  . High risk medication use 07/04/2016  . Lupus ([place]) 05/23/2016  . 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 ADHD (attention deficit hyperactivity disorder) 12/22/2014  . Raynaud's phenomenon 10/17/2013  . Hyperlipidemia 09/28/2011    History obtained from: chart  review and patient.  Robin [name] is a 49 y.o. female presenting for a follow up visit.  She was last seen in April 2021.  At that time, her lung testing looked great.  We continued with albuterol every 4 hours as needed.  For her allergic rhinitis, we continued with an antihistamine daily in combination with Singulair and Astelin as needed.  She did decide to start allergen immunotherapy.  Since last visit, she has done well.  Asthma/Respiratory Symptom History: She remains on albuterol as needed.  She has not been using the Singulair on a routine basis.  She has not been to the emergency room has not required any systemic steroids.  ACT score is 25, indicating excellent asthma control.  Allergic Rhinitis Symptom History: Allergy shots are going well.  She does feel little bit better since starting them.  She has not reached the red vial yet.  She has missed a few weeks, so she is not as far along as Robin [name], her child. She has not needed any antibiotics for sinusitis.  Otherwise, there have been no changes to her past medical history, surgical history, family history, or social history.    Review of Systems  Constitutional: Negative.  Negative for chills, fever, malaise/fatigue and weight loss.  HENT: Negative.  Negative for congestion, ear discharge and ear pain.   Eyes: Negative for pain, discharge and redness.  Respiratory: Negative for cough, sputum production, shortness of breath and wheezing.   Cardiovascular: Negative.  Negative for chest pain and palpitations.  Gastrointestinal: Negative for abdominal pain, constipation, diarrhea, heartburn, nausea and vomiting.  Skin: Negative.  Negative for  itching and rash.  Neurological: Negative for dizziness and headaches.  Endo/Heme/Allergies: Negative for environmental allergies. Does not bruise/bleed easily.       Objective:   Blood pressure 112/80, pulse 74, resp. rate 14, height 5' 6"  (1.676 m), weight 154 lb 6.4 oz (70 kg), last menstrual  period 06/21/2020, SpO2 100 %. Body mass index is 24.92 kg/m.   Physical Exam:  Physical Exam Constitutional:      Appearance: She is well-developed.     Comments: Very friendly.  HENT:     Head: Normocephalic and atraumatic.     Right Ear: Tympanic membrane, ear canal and external ear normal.     Left Ear: Tympanic membrane, ear canal and external ear normal.     Nose: No nasal deformity, septal deviation, mucosal edema or rhinorrhea.     Right Turbinates: Enlarged and swollen.     Left Turbinates: Enlarged and swollen.     Right Sinus: No maxillary sinus tenderness or frontal sinus tenderness.     Left Sinus: No maxillary sinus tenderness or frontal sinus tenderness.     Mouth/Throat:     Mouth: Mucous membranes are not pale and not dry.     Pharynx: Uvula midline.  Eyes:     General:        Right eye: No discharge.        Left eye: No discharge.     Conjunctiva/sclera: Conjunctivae normal.     Right eye: Right conjunctiva is not injected. No chemosis.    Left eye: Left conjunctiva is not injected. No chemosis.    Pupils: Pupils are equal, round, and reactive to light.  Cardiovascular:     Rate and Rhythm: Normal rate and regular rhythm.     Heart sounds: Normal heart sounds.  Pulmonary:     Effort: Pulmonary effort is normal. No tachypnea, accessory muscle usage or respiratory distress.     Breath sounds: Normal breath sounds. No wheezing, rhonchi or rales.     Comments: Moving air well in all lung fields. Chest:     Chest wall: No tenderness.  Lymphadenopathy:     Cervical: No cervical adenopathy.  Skin:    Coloration: Skin is not pale.     Findings: No abrasion, erythema, petechiae or rash. Rash is not papular, urticarial or vesicular.  Neurological:     Mental Status: She is alert.  Psychiatric:        Behavior: Behavior is cooperative.      Diagnostic studies: none        Salvatore Marvel, MD  [facility] [place]

## 2020-07-08 ENCOUNTER — Encounter: Payer: Self-pay | Admitting: Allergy & Immunology

## 2020-07-09 DIAGNOSIS — F339 Major depressive disorder, recurrent, unspecified: Secondary | ICD-10-CM | POA: Diagnosis not present

## 2020-07-15 DIAGNOSIS — M25561 Pain in right knee: Secondary | ICD-10-CM | POA: Diagnosis not present

## 2020-07-18 ENCOUNTER — Other Ambulatory Visit: Payer: Self-pay | Admitting: Physician Assistant

## 2020-07-18 DIAGNOSIS — M3219 Other organ or system involvement in systemic lupus erythematosus: Secondary | ICD-10-CM

## 2020-07-23 ENCOUNTER — Ambulatory Visit (INDEPENDENT_AMBULATORY_CARE_PROVIDER_SITE_OTHER): Payer: BC Managed Care – PPO

## 2020-07-23 DIAGNOSIS — J302 Other seasonal allergic rhinitis: Secondary | ICD-10-CM

## 2020-07-23 DIAGNOSIS — J309 Allergic rhinitis, unspecified: Secondary | ICD-10-CM | POA: Diagnosis not present

## 2020-07-23 DIAGNOSIS — F339 Major depressive disorder, recurrent, unspecified: Secondary | ICD-10-CM | POA: Diagnosis not present

## 2020-07-28 ENCOUNTER — Ambulatory Visit (INDEPENDENT_AMBULATORY_CARE_PROVIDER_SITE_OTHER): Payer: BC Managed Care – PPO

## 2020-07-28 DIAGNOSIS — J309 Allergic rhinitis, unspecified: Secondary | ICD-10-CM | POA: Diagnosis not present

## 2020-07-28 DIAGNOSIS — J302 Other seasonal allergic rhinitis: Secondary | ICD-10-CM

## 2020-08-04 DIAGNOSIS — F339 Major depressive disorder, recurrent, unspecified: Secondary | ICD-10-CM | POA: Diagnosis not present

## 2020-08-04 DIAGNOSIS — F902 Attention-deficit hyperactivity disorder, combined type: Secondary | ICD-10-CM | POA: Diagnosis not present

## 2020-08-12 ENCOUNTER — Encounter (HOSPITAL_COMMUNITY): Payer: Self-pay | Admitting: *Deleted

## 2020-08-12 ENCOUNTER — Ambulatory Visit (HOSPITAL_COMMUNITY)
Admission: EM | Admit: 2020-08-12 | Discharge: 2020-08-12 | Disposition: A | Discharge status: Home / Self Care | Payer: BC Managed Care – PPO | Attending: Family Medicine | Admitting: Family Medicine

## 2020-08-12 ENCOUNTER — Ambulatory Visit (INDEPENDENT_AMBULATORY_CARE_PROVIDER_SITE_OTHER): Payer: BC Managed Care – PPO

## 2020-08-12 ENCOUNTER — Other Ambulatory Visit: Payer: Self-pay

## 2020-08-12 DIAGNOSIS — S6992XA Unspecified injury of left wrist, hand and finger(s), initial encounter: Secondary | ICD-10-CM | POA: Diagnosis not present

## 2020-08-12 DIAGNOSIS — W19XXXA Unspecified fall, initial encounter: Secondary | ICD-10-CM

## 2020-08-12 MED ORDER — AMOXICILLIN-POT CLAVULANATE 875-125 MG PO TABS: 1.0000 | ORAL_TABLET | Freq: Two times a day (BID) | ORAL | 0 refills | Status: DC

## 2020-08-12 MED ORDER — BACITRACIN ZINC 500 UNIT/GM EX OINT
TOPICAL_OINTMENT | CUTANEOUS | Status: AC
  Filled 2020-08-12: qty 0.9

## 2020-08-12 MED ORDER — TETANUS-DIPHTH-ACELL PERTUSSIS 5-2.5-18.5 LF-MCG/0.5 IM SUSY
0.5000 mL | PREFILLED_SYRINGE | Freq: Once | INTRAMUSCULAR | Status: AC
  Administered 2020-08-12: 0.5 mL via INTRAMUSCULAR

## 2020-08-12 MED ORDER — TETANUS-DIPHTH-ACELL PERTUSSIS 5-2.5-18.5 LF-MCG/0.5 IM SUSY
PREFILLED_SYRINGE | INTRAMUSCULAR | Status: AC
  Filled 2020-08-12: qty 0.5

## 2020-08-12 NOTE — ED Triage Notes (Signed)
Pt reports rolling her Lt ankle when stepping off curb and fell. Pt landed on Rt/LT hand and has a lac to Lt palm . Pt has swelling to RT and LT hand. Pt reports hitting her head during the fall. Pt is now nauseated in triage and wants to vomit. Pt is A/O x4 .

## 2020-08-12 NOTE — Discharge Instructions (Signed)
We have cleaned the wound fairly well here today.  Keep clean and dressed daily until healing.  We will go ahead and place her on some antibiotics prophylactic ally.  The radiologist questioned possible scaphoid fracture on your x-ray.  We will place you in a thumb spica splint.  Wear this for 2 weeks and then follow-up for repeat x-ray

## 2020-08-12 NOTE — ED Provider Notes (Signed)
[facility]    CSN: [id number] Arrival date & time: 08/12/20  2703      History   Chief Complaint Chief Complaint  Patient presents with  . Fall  . Hand Injury    RT/LT    HPI Robin [name] is a 49 y.o. female.   Patient is a 49 year old female the presents today with injuries post fall.  Reporting she twisted the left ankle this morning due to uneven pavement and fell landing on the left hand, right hand and right shoulder.  Most of the pain and swelling is to the left hand with abrasions.  She is moving everything appropriately.  Took an oxycodone for pain which seemed to help.  Mild nausea no vomiting.  Believes it may be from the medication.  Mild numbness and tingling in the left hand     Past Medical History:  Diagnosis Date  . ADD (attention deficit disorder)   . Allergy   . Anemia   . Anxiety   . Asthma   . Depression   . Hyperlipidemia   . Ileitis   . Lupus ([place])   . PONV (postoperative nausea and vomiting)    mild nausea   . Raynaud disease     Patient Active Problem List   Diagnosis Date Noted  . Seasonal and perennial allergic rhinitis 11/06/2018  . Mild intermittent asthma, uncomplicated 50/05/3817  . Flexural atopic dermatitis 11/06/2018  . History of hyperlipidemia 11/08/2016  . History of anxiety 11/08/2016  . History of retinal tear 11/08/2016  . History of Bilateral plantar fasciitis 08/10/2016  . High risk medication use 07/04/2016  . Lupus ([place]) 05/23/2016  . 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 ADHD (attention deficit hyperactivity disorder) 12/22/2014  . Raynaud's phenomenon 10/17/2013  . Hyperlipidemia 09/28/2011    Past Surgical History:  Procedure Laterality Date  . CARPAL TUNNEL RELEASE Right    08/2019  . CESAREAN SECTION     x 2  . DENTAL SURGERY    . DILITATION & CURRETTAGE/HYSTROSCOPY WITH NOVASURE ABLATION N/A 06/03/2020   Procedure: DILATATION & CURETTAGE/HYSTEROSCOPY WITH NOVASURE ABLATION;  Surgeon:  Aletha Halim, MD;  Location: [facility];  Service: Gynecology;  Laterality: N/A;  . ENDOMETRIAL BIOPSY  04/20/2020      . EYE SURGERY Bilateral    cryoretinopexy    OB History    Gravida  2   Para  2   Term  2   Preterm      AB      Living  2     SAB      TAB      Ectopic      Multiple      Live Births  2        Obstetric Comments  c-section x 2         Home Medications    Prior to Admission medications   Medication Sig Start Date End Date Taking? Authorizing Provider  acetaminophen (TYLENOL) 500 MG tablet Take 1,000 mg by mouth every 6 (six) hours as needed for mild pain.   Yes [provider]  albuterol (PROVENTIL HFA;VENTOLIN HFA) 108 (90 Base) MCG/ACT inhaler Inhale 2 puffs into the lungs every 6 (six) hours as needed for wheezing or shortness of breath. 08/27/16  Yes Tereasa Coop, PA-C  amphetamine-dextroamphetamine (ADDERALL) 10 MG tablet Take 10 mg once a day to twice daily. Patient taking differently: Take 20 mg by mouth daily with breakfast. Take 10  mg once a day to twice daily. 10/12/17  Yes Wardell Honour, MD  buPROPion (WELLBUTRIN XL) 300 MG 24 hr tablet  02/04/18  Yes [provider]  DiphenhydrAMINE HCl (BENADRYL ALLERGY PO) Take by mouth as needed.   Yes [provider]  Fexofenadine HCl (ALLEGRA PO) Take by mouth daily.   Yes [provider]  FLUoxetine (PROZAC) 10 MG capsule Take 10 mg by mouth every morning. 11/29/19  Yes [provider]  hydroxychloroquine (PLAQUENIL) 200 MG tablet TAKE 1 TABLET BY MOUTH TWICE DAILY Monday-Friday ONLY 05/22/20  Yes Ofilia Neas, PA-C  Melatonin 5 MG CAPS Take by mouth.   Yes [provider]  montelukast (SINGULAIR) 10 MG tablet TAKE 1 TABLET(10 MG) BY MOUTH AT BEDTIME 06/25/20  Yes Valentina Shaggy, MD  Omega 3-6-9 Fatty Acids (OMEGA-3-6-9 PO) Take 1 capsule by mouth daily.    Yes [provider]  sodium chloride (OCEAN)  0.65 % nasal spray Place 1 spray into the nose daily as needed for congestion.    Yes [provider]  VITAMIN D, CHOLECALCIFEROL, PO Take 500 mg by mouth.   Yes [provider]  amoxicillin-clavulanate (AUGMENTIN) 875-125 MG tablet Take 1 tablet by mouth every 12 (twelve) hours. 08/12/20   Loura Halt A, NP  azelastine (ASTELIN) 0.1 % nasal spray Place 2 sprays into both nostrils 2 (two) times daily as needed for rhinitis. PLACE 2 SPRAYS INTO BOTH NOSTRILS TWICE DAILY 08/23/19 03/10/20  Valentina Shaggy, MD  EPINEPHrine 0.3 mg/0.3 mL IJ SOAJ injection Inject 0.3 mLs (0.3 mg total) into the muscle as needed for anaphylaxis. 01/21/20   Kozlow, Donnamarie Poag, MD  UNABLE TO FIND once a week. Med Name: allergy shots    [provider]    Family History Family History  Problem Relation Age of Onset  . Heart disease Mother 36       CAD/stenting  . Hyperlipidemia Mother   . Arthritis Mother        ostearthritis  . Hypertension Mother   . Kidney disease Mother   . Irritable bowel syndrome Mother   . Stroke Mother 75       CVA  . Fibromyalgia Mother   . Eczema Mother   . Hyperlipidemia Father   . Neuropathy Father   . Allergies Father        shellfish  . Hyperlipidemia Sister   . Hypertension Brother   . Colon cancer Neg Hx   . Inflammatory bowel disease Neg Hx   . Allergic rhinitis Neg Hx   . Angioedema Neg Hx   . Atopy Neg Hx   . Asthma Neg Hx   . Immunodeficiency Neg Hx   . Urticaria Neg Hx     Social History Social History   Tobacco Use  . Smoking status: Former Smoker    Packs/day: 0.75    Years: 10.00    Pack years: 7.50    Types: Cigarettes    Quit date: 07/05/2001    Years since quitting: 19.1  . Smokeless tobacco: Never Used  Vaping Use  . Vaping Use: Never used  Substance Use Topics  . Alcohol use: Yes    Alcohol/week: 14.0 standard drinks    Types: 14 Cans of beer per week    Comment: 2 beer daily  . Drug use: Never     Allergies     Avelox [moxifloxacin hcl in nacl], Cefaclor, Dust mite extract, Latex, Mold extract [trichophyton], Red wine complex [  germanium], Meloxicam, and Naproxen   Review of Systems Review of Systems   Physical Exam Triage Vital Signs ED Triage Vitals  Enc Vitals Group     BP 08/12/20 0909 101/63     Pulse Rate 08/12/20 0909 68     Resp 08/12/20 0909 18     Temp 08/12/20 0909 97.9 F (36.6 C)     Temp src --      SpO2 08/12/20 0909 95 %     Weight 08/12/20 0912 149 lb (67.6 kg)     Height 08/12/20 0912 5' 6"  (1.676 m)     Head Circumference --      Peak Flow --      Pain Score 08/12/20 0911 8     Pain Loc --      Pain Edu? --      Excl. in [place]? --    No data found.  Updated Vital Signs BP 101/63 (BP Location: Right Arm)   Pulse 68   Temp 97.9 F (36.6 C)   Resp 18   Ht 5' 6"  (1.676 m)   Wt 149 lb (67.6 kg)   LMP 08/04/2020   SpO2 95%   BMI 24.05 kg/m   Visual Acuity Right Eye Distance:   Left Eye Distance:   Bilateral Distance:    Right Eye Near:   Left Eye Near:    Bilateral Near:     Physical Exam Vitals and nursing note reviewed.  Constitutional:      General: She is not in acute distress.    Appearance: Normal appearance. She is not ill-appearing, toxic-appearing or diaphoretic.  HENT:     Head: Normocephalic.     Nose: Nose normal.  Eyes:     Conjunctiva/sclera: Conjunctivae normal.  Pulmonary:     Effort: Pulmonary effort is normal.  Musculoskeletal:        General: Normal range of motion.       Hands:     Cervical back: Normal range of motion.     Comments: Tenderness and swelling    Skin:    General: Skin is warm and dry.     Findings: No rash.  Neurological:     Mental Status: She is alert.  Psychiatric:        Mood and Affect: Mood normal.      UC Treatments / Results  Labs (all labs ordered are listed, but only abnormal results are displayed) Labs Reviewed - No data to display  EKG   Radiology DG Hand Complete  Left  Result Date: 08/12/2020 CLINICAL DATA:  Fall, hand injury EXAM: LEFT HAND - COMPLETE 3+ VIEW COMPARISON:  None. FINDINGS: There is no evidence of fracture or dislocation. There is no evidence of arthropathy or other focal bone abnormality. Soft tissues are unremarkable. IMPRESSION: No acute osseous abnormality. If there is anatomic snuffbox tenderness/clinical concern for occult scaphoid fracture, recommend MRI or splinting and follow up x-rays in 10-14 days. Electronically Signed   By: Dahlia Bailiff MD   On: 08/12/2020 09:48    Procedures Procedures (including critical care time)  Medications Ordered in UC Medications  Tdap (BOOSTRIX) injection 0.5 mL (has no administration in time range)    Initial Impression / Assessment and Plan / UC Course  I have reviewed the triage vital signs and the nursing notes.  Pertinent labs & imaging results that were available during my care of the patient were reviewed by me and considered in my medical decision making (see chart  for details).     Hand injury post fall Patient with abrasion to left palmar area and swelling with limited range of motion. X-ray with questionable scaphoid fracture Wound cleaned well here today.  Dressed and placed in thumb spica splint. Tetanus updated We will have her follow-up in 2 weeks for repeat x-ray Recommended rest, ice, elevate and ibuprofen as needed Follow up as needed for continued or worsening symptoms  Final Clinical Impressions(s) / UC Diagnoses   Final diagnoses:  Injury of left hand, initial encounter  Fall, initial encounter     Discharge Instructions     We have cleaned the wound fairly well here today.  Keep clean and dressed daily until healing.  We will go ahead and place her on some antibiotics prophylactic ally.  The radiologist questioned possible scaphoid fracture on your x-ray.  We will place you in a thumb spica splint.  Wear this for 2 weeks and then follow-up for repeat  x-ray     ED Prescriptions    Medication Sig Dispense Auth. Provider   amoxicillin-clavulanate (AUGMENTIN) 875-125 MG tablet Take 1 tablet by mouth every 12 (twelve) hours. 14 tablet Hannie Shoe A, NP     PDMP not reviewed this encounter.   Loura Halt A, NP 08/12/20 1022

## 2020-08-14 DIAGNOSIS — J309 Allergic rhinitis, unspecified: Secondary | ICD-10-CM | POA: Diagnosis not present

## 2020-08-17 ENCOUNTER — Encounter: Payer: Self-pay | Admitting: Rheumatology

## 2020-08-17 ENCOUNTER — Encounter: Payer: Self-pay | Admitting: Orthopaedic Surgery

## 2020-08-17 DIAGNOSIS — M3219 Other organ or system involvement in systemic lupus erythematosus: Secondary | ICD-10-CM

## 2020-08-17 NOTE — Telephone Encounter (Signed)
Yes please call her for an appt. Thanks.

## 2020-08-18 MED ORDER — HYDROXYCHLOROQUINE SULFATE 200 MG PO TABS: ORAL_TABLET | ORAL | 0 refills | Status: DC

## 2020-08-18 NOTE — Progress Notes (Signed)
Office Visit Note  Patient: Robin [name]             Date of Birth: September 10, 1971           MRN: [id number]             PCP: Jacelyn Pi, Lilia Argue, MD Referring: No ref. provider found Visit Date: 09/01/2020 Occupation: @GUAROCC @  Subjective:  Left hip pain   History of Present Illness: Robin [name] is a 49 y.o. female with history of systemic lupus erythematosus.  She takes plaquenil 200 mg 1 tablet by mouth twice daily M-F. She denies any signs or symptoms of a systemic lupus flare.  She continues to have intermittent symptoms of raynaud's but denies any digital ulcerations.  She denies any recent rashes.  She has occasional oral and nasal ulcerations and ongoing sicca symptoms. She denies any palpitations, shortness or breath, or chest pain. She presents today with right shoulder joint pain which started 9 months ago.  She has been trying to avoid overhead exercises and has not performed push ups since having right carpal tunnel release by Dr. Erlinda Hong last year.  She denies any paresthesias in the her right since surgery.  She has also been experiencing discomfort in the left hip for the past 6-9 months. She denies any injuries prior to the onset of symptoms.  She has noticed gait instability and more frequent falls. She experiences an intermittent catching sensation in the left hip especially when transitioning from a standing to seated position.  She denies any groin pain.  Most of her discomfort in anterior and is described as a dull sensation. She has also been experiencing nocturnal pain, difficulty climbing steps, and pain with flexion of the left hip. She has tried using a foam roller, stretching, strengthening, and heat with minimal relief.  She is concerned about the instability she has been experiencing despite the lower extremity strengthening and balance exercises she has been performing.  Overall has been sleeping better at night taking melatonin.  Her level of fatigue has been  stable.      Activities of Daily Living:  Patient reports morning stiffness for 45-60  minutes.   Patient Reports nocturnal pain.  Difficulty dressing/grooming: Denies Difficulty climbing stairs: Reports Difficulty getting out of chair: Reports Difficulty using hands for taps, buttons, cutlery, and/or writing: Denies  Review of Systems  Constitutional: Negative for fatigue.  HENT: Positive for mouth sores and mouth dryness. Negative for nose dryness.   Eyes: Positive for dryness. Negative for pain and visual disturbance.  Respiratory: Negative for cough, hemoptysis, shortness of breath and difficulty breathing.   Cardiovascular: Negative for chest pain, palpitations, hypertension and swelling in legs/feet.  Gastrointestinal: Negative for blood in stool, constipation and diarrhea.  Endocrine: Negative for increased urination.  Genitourinary: Negative for painful urination.  Musculoskeletal: Positive for arthralgias, gait problem and joint pain. Negative for joint swelling, myalgias, muscle weakness, morning stiffness, muscle tenderness and myalgias.  Skin: Positive for color change. Negative for pallor, rash, hair loss, nodules/bumps, skin tightness, ulcers and sensitivity to sunlight.  Allergic/Immunologic: Negative for susceptible to infections.  Neurological: Negative for dizziness, numbness, headaches and weakness.  Hematological: Negative for swollen glands.  Psychiatric/Behavioral: Positive for depressed mood (Managed) and sleep disturbance. The patient is not nervous/anxious.     PMFS History:  Patient Active Problem List   Diagnosis Date Noted  . Seasonal and perennial allergic rhinitis 11/06/2018  . Mild intermittent asthma, uncomplicated 02/63/7858  . Flexural atopic  dermatitis 11/06/2018  . History of hyperlipidemia 11/08/2016  . History of anxiety 11/08/2016  . History of retinal tear 11/08/2016  . History of Bilateral plantar fasciitis 08/10/2016  . High risk  medication use 07/04/2016  . Lupus ([place]) 05/23/2016  . 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 ADHD (attention deficit hyperactivity disorder) 12/22/2014  . Raynaud's phenomenon 10/17/2013  . Hyperlipidemia 09/28/2011    Past Medical History:  Diagnosis Date  . ADD (attention deficit disorder)   . Allergy   . Anemia   . Anxiety   . Asthma   . Depression   . Hyperlipidemia   . Ileitis   . Lupus ([place])   . PONV (postoperative nausea and vomiting)    mild nausea   . Raynaud disease     Family History  Problem Relation Age of Onset  . Heart disease Mother 19       CAD/stenting  . Hyperlipidemia Mother   . Arthritis Mother        ostearthritis  . Hypertension Mother   . Kidney disease Mother   . Irritable bowel syndrome Mother   . Stroke Mother 80       CVA  . Fibromyalgia Mother   . Eczema Mother   . Hyperlipidemia Father   . Neuropathy Father   . Allergies Father        shellfish  . Hyperlipidemia Sister   . Hypertension Brother   . Colon cancer Neg Hx   . Inflammatory bowel disease Neg Hx   . Allergic rhinitis Neg Hx   . Angioedema Neg Hx   . Atopy Neg Hx   . Asthma Neg Hx   . Immunodeficiency Neg Hx   . Urticaria Neg Hx    Past Surgical History:  Procedure Laterality Date  . CARPAL TUNNEL RELEASE Right    08/2019  . CESAREAN SECTION     x 2  . DENTAL SURGERY    . DILITATION & CURRETTAGE/HYSTROSCOPY WITH NOVASURE ABLATION N/A 06/03/2020   Procedure: DILATATION & CURETTAGE/HYSTEROSCOPY WITH NOVASURE ABLATION;  Surgeon: Aletha Halim, MD;  Location: [facility];  Service: Gynecology;  Laterality: N/A;  . ENDOMETRIAL BIOPSY  04/20/2020      . EYE SURGERY Bilateral    cryoretinopexy   Social History   Social History Narrative   Marital status: married x 17 years; happily married      Children: 2 children (43, 23)      Lives: with husband, 2 children.      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 Influenza,inj,Quad PF,6+ Mos 05/12/2014, 11/27/2015, 05/23/2016, 10/30/2017, 07/30/2018  . PFIZER SARS-COV-2 Vaccination 12/06/2019, 12/16/2019, 05/27/2020  . Pneumococcal Conjugate-13 05/23/2016  . Tdap 09/28/2011, 08/12/2020     Objective: Vital Signs: BP 115/76 (BP Location: Right Arm, Patient Position: Sitting, Cuff Size: Normal)   Pulse 77   Resp 14   Ht 5' 6"  (1.676 m)   Wt 157 lb (71.2 kg)   LMP 08/04/2020   BMI 25.34 kg/m    Physical Exam Vitals and nursing note reviewed.  Constitutional:      Appearance: She is well-developed and well-nourished.  HENT:     Head: Normocephalic and atraumatic.  Eyes:  Extraocular Movements: EOM normal.     Conjunctiva/sclera: Conjunctivae normal.  Cardiovascular:     Pulses: Intact distal pulses.  Pulmonary:     Effort: Pulmonary effort is normal.  Abdominal:     Palpations: Abdomen is soft.  Musculoskeletal:     Cervical back: Normal range of motion.  Skin:    General: Skin is warm and dry.     Capillary Refill: Capillary refill takes 2 to 3 seconds.     Comments: No digital ulcerations or signs of gangrene.   Neurological:     Mental Status: She is alert and oriented to person, place, and time.  Psychiatric:        Mood and Affect: Mood and affect normal.        Behavior: Behavior normal.      Musculoskeletal Exam:  C-spine, thoracic spine, and lumbar spine good ROM.  Shoulder joints good ROM with some discomfort in the right shoulder.  Tenderness along the anterior aspect of the right shoulder.  Elbow joints, wrist joints, MCPs, PIPs, and DIPs good ROM with no synovitis.  Complete fist formation bilaterally.  PIP thickening but no tenderness or inflammation noted.  Hip joints good ROM with discomfort in the left hip.  Tenderness over the left hip  flexors and trochanteric bursa.  Knee joints good ROM with no warmth or effusion. Ankle joints have full extension and extension.  No tenderness or inflammation of ankle joints noted.  Tenderness over the 1st MTP joint bilaterally.  No calf tenderness noted. No achilles tendonitis or plantar fasciitis.   CDAI Exam: CDAI Score: -- Patient Global: --; Provider Global: -- Swollen: --; Tender: -- Joint Exam 09/01/2020   No joint exam has been documented for this visit   There is currently no information documented on the homunculus. Go to the Rheumatology activity and complete the homunculus joint exam.  Investigation: No additional findings.  Imaging: DG Hand Complete Left  Result Date: 08/12/2020 CLINICAL DATA:  Fall, hand injury EXAM: LEFT HAND - COMPLETE 3+ VIEW COMPARISON:  None. FINDINGS: There is no evidence of fracture or dislocation. There is no evidence of arthropathy or other focal bone abnormality. Soft tissues are unremarkable. IMPRESSION: No acute osseous abnormality. If there is anatomic snuffbox tenderness/clinical concern for occult scaphoid fracture, recommend MRI or splinting and follow up x-rays in 10-14 days. Electronically Signed   By: Dahlia Bailiff MD   On: 08/12/2020 09:48   XR HIP UNILAT W OR W/O PELVIS 2-3 VIEWS LEFT  Result Date: 09/01/2020 No hip joint narrowing was noted.  No chondrocalcinosis was noted.  No SI joint narrowing was noted. Impression: Unremarkable x-ray of the hip joint.  XR Wrist Complete Left  Result Date: 08/25/2020 No acute or structural abnormalities.  XR Shoulder Right  Result Date: 09/01/2020 No glenohumeral or acromioclavicular joint space narrowing was noted.  No chondrocalcinosis was noted. Impression: Unremarkable x-ray of the shoulder joint.   Recent Labs: Lab Results  Component Value Date   WBC 4.2 06/01/2020   HGB 12.1 06/01/2020   PLT 217 06/01/2020   NA 136 06/01/2020   K 4.2 06/01/2020   CL 103 06/01/2020   CO2 26  06/01/2020   GLUCOSE 89 06/01/2020   BUN 12 06/01/2020   CREATININE 0.86 06/01/2020   BILITOT 0.8 06/01/2020   ALKPHOS 26 (L) 06/01/2020   AST 20 06/01/2020   ALT 15 06/01/2020   PROT 6.3 (L) 06/01/2020   ALBUMIN 3.8 06/01/2020   CALCIUM 9.0 06/01/2020  GFRAA >60 06/01/2020    Speciality Comments: PLQ Eye Exam: 05/29/2020 WNL @ [facility] (in [place]) Follow up in 1 year.  Procedures:  No procedures performed Allergies: Avelox [moxifloxacin hcl in nacl], Cefaclor, Dust mite extract, Latex, Mold extract [trichophyton], Red wine complex [germanium], Meloxicam, and Naproxen   Assessment / Plan:     Visit Diagnoses: Other systemic lupus erythematosus with other organ involvement (HCC) - Low C3, dsDNA 1: She has not had any signs or symptoms of a systemic lupus flare.  She is clinically doing well taking plaquenil 200 mg 1 tablet by mouth twice daily M-F. Lab work from 03/10/20 was reviewed with the patient today in the office: dsDNA negative, ESR 2, complements WNL, and ANA remains positive.  We will update the following lab work today. She continues to have intermittent oral and nasal ulcers, sicca symptoms, symptoms of raynaud's, and joint stiffness.  She has no synovitis on examination today.  No digital ulcerations or signs of gangrene noted.  She has not had any recent rashes.  No malar rash noted. She has not had any palpations, chest pain, or shortness of breath. She will continue taking plaquenil 200 mg 1 tablet by mouth twice daily M-F.  She was advised to notify us if she develops signs or symptoms of a flare.  She will follow up in the office in 5 months.  - Plan: CBC with Differential/Platelet, COMPLETE METABOLIC PANEL WITH GFR, Urinalysis, Routine w reflex microscopic, Anti-DNA antibody, double-stranded, Sedimentation rate, C3 and C4  High risk medication use - Plaquenil 200 mg 1 tablet by mouth twice daily M-F. PLQ Eye Exam: 05/29/2020 WNL @ [facility] (in [place]) Follow up in 1 year. CBC and CMP updated on 06/01/20.  Orders for CBC and CMP released today. - Plan: CBC with Differential/Platelet, COMPLETE METABOLIC PANEL WITH GFR  Raynaud's phenomenon without gangrene: She has intermittent symptoms of raynaud's. Fingers cool to touch today.  Slightly delayed capillary refill 2-3 seconds. No digital ulcerations or signs or gangrene. No skin tightness or thickening.  She has been trying to keep her core body temperature warm, wearing gloves, and heated socks.  She was advised to notify us if she develops any new or worsening symptoms.    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   Other fatigue: Stable. She exercises on a regular basis.   Plantar fasciitis: Her symptoms resolved after completing physical therapy including dry needling.  She wears proper fitting shoes.   S/P carpal tunnel release: Performed by Dr. Erlinda Hong.  No complications.  Complete resolution of symptoms after surgery.   Chronic right shoulder pain -She presents today with right shoulder joint pain which started about 9 months ago. She did not have any injury prior to the onset of symptoms.  She attributes her discomfort to positional changes s/p carpal tunnel release.  She has good ROM of the right shoulder joint on exam.  X-rays of the right shoulder were obtained today which were unremarkable. Referral to physical therapy was placed today.  She was advised to notify us if her discomfort persists or worsens.  Plan: XR Shoulder Right  Pain in left hip - She presents today with left hip pain which started 6-9 months ago.  She did not have any injury prior to the onset of symptoms.  She has been experiencing a catching sensation when transitioning from a standing to seated position.  She is also noticed worsening nocturnal pain.  She has had difficulty climbing  steps as well as running.  She has had several falls due to the instability in her left hip.  She has good range of motion of  the left hip joint on examination today.  She has some tenderness over the left trochanteric bursa and hip flexors.  X-rays of the left hip were unremarkable.  Referral to physical therapy placed today.  She was advised to schedule an appointment with Dr. Erlinda Hong for further evaluation and management if her discomfort persists or worsens. 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 XR HIP UNILAT W OR W/O PELVIS 2-3 VIEWS LEFT  . XR Shoulder Right  . CBC with Differential/Platelet  . COMPLETE METABOLIC PANEL WITH GFR  . Urinalysis, Routine w reflex microscopic  . Anti-DNA antibody, double-stranded  . Sedimentation rate  . C3 and C4  . Ambulatory referral to Physical Therapy   No orders of the defined types were placed in this encounter.     Follow-Up Instructions: Return in about 5 months (around 01/30/2021) for Systemic lupus erythematosus.   Ofilia Neas, PA-C  Note - This record has been created using Dragon software.  Chart creation errors have been sought, but may not always  have been located. Such creation errors do not reflect on  the standard of medical care.

## 2020-08-18 NOTE — Telephone Encounter (Signed)
Last Visit: 03/10/2020 Next Visit: due November 2021. Message sent to patient vi my chart to advise.  Labs:06/01/2020 Total Protein 6.3 Alk Phos 26 Eye exam: 05/29/2020 WNL   Current Dose per office note 03/10/2020: Plaquenil 200 mg 1 tablet by mouth twice daily M-F DX: Other systemic lupus erythematosus with other organ involvement  Okay to refill PLQ?

## 2020-08-20 ENCOUNTER — Ambulatory Visit (INDEPENDENT_AMBULATORY_CARE_PROVIDER_SITE_OTHER): Payer: BC Managed Care – PPO

## 2020-08-20 DIAGNOSIS — J309 Allergic rhinitis, unspecified: Secondary | ICD-10-CM

## 2020-08-20 DIAGNOSIS — Z03818 Encounter for observation for suspected exposure to other biological agents ruled out: Secondary | ICD-10-CM | POA: Diagnosis not present

## 2020-08-25 ENCOUNTER — Other Ambulatory Visit: Payer: Self-pay

## 2020-08-25 ENCOUNTER — Ambulatory Visit (INDEPENDENT_AMBULATORY_CARE_PROVIDER_SITE_OTHER): Payer: BC Managed Care – PPO | Admitting: Orthopaedic Surgery

## 2020-08-25 ENCOUNTER — Ambulatory Visit: Payer: Self-pay

## 2020-08-25 DIAGNOSIS — M25532 Pain in left wrist: Secondary | ICD-10-CM

## 2020-08-25 NOTE — Progress Notes (Signed)
Office Visit Note   Patient: Robin [name]           Date of Birth: 12/03/1970           MRN: [id number] Visit Date: 08/25/2020              Requested by: No referring provider defined for this encounter. PCP: Jacelyn Pi, Lilia Argue, MD   Assessment & Plan: Visit Diagnoses:  1. Pain in left wrist     Plan: Impression is left wrist sprain and contusion. X-rays are negative for scaphoid fracture and clinically not concerning for occult fracture. At this point I would just recommend symptomatic treatment and increase activity and use of the hand and wrist as tolerated. Follow-up as needed.  Follow-Up Instructions: Return if symptoms worsen or fail to improve.   Orders:  Orders Placed This Encounter  Procedures  . XR Wrist Complete Left   No orders of the defined types were placed in this encounter.     Procedures: No procedures performed   Clinical Data: No additional findings.   Subjective: Chief Complaint  Patient presents with  . Left Hand - Follow-up, Pain, Fracture    Robin [name] is a 49 year old female who new from a previous right carpal tunnel release. She comes in for evaluation of a new injury to her left hand. She had a mechanical fall 2 weeks ago onto an outstretched left hand. Originally had abrasion and bruising but overall it is feeling better. The abrasion is healed and the bruising is improving. She mainly wants to get checked out for a scaphoid fracture.   Review of Systems  Constitutional: Negative.   HENT: Negative.   Eyes: Negative.   Respiratory: Negative.   Cardiovascular: Negative.   Endocrine: Negative.   Musculoskeletal: Negative.   Neurological: Negative.   Hematological: Negative.   Psychiatric/Behavioral: Negative.   All other systems reviewed and are negative.    Objective: Vital Signs: LMP 08/04/2020   Physical Exam Vitals and nursing note reviewed.  Constitutional:      Appearance: She is well-developed and  well-nourished.  Pulmonary:     Effort: Pulmonary effort is normal.  Skin:    General: Skin is warm.     Capillary Refill: Capillary refill takes less than 2 seconds.  Neurological:     Mental Status: She is alert and oriented to person, place, and time.  Psychiatric:        Mood and Affect: Mood and affect normal.        Behavior: Behavior normal.        Thought Content: Thought content normal.        Judgment: Judgment normal.     Ortho Exam Left wrist shows healed abrasion and mild tenderness to the scaphoid tubercle. No anatomic snuffbox tenderness. Good range of motion of the wrist without pain. She has some light bruising of the antebrachial region. Specialty Comments:  No specialty comments available.  Imaging: XR Wrist Complete Left  Result Date: 08/25/2020 No acute or structural abnormalities.    PMFS History: Patient Active Problem List   Diagnosis Date Noted  . Seasonal and perennial allergic rhinitis 11/06/2018  . Mild intermittent asthma, uncomplicated 99/24/2683  . Flexural atopic dermatitis 11/06/2018  . History of hyperlipidemia 11/08/2016  . History of anxiety 11/08/2016  . History of retinal tear 11/08/2016  . History of Bilateral plantar fasciitis 08/10/2016  . High risk medication use 07/04/2016  . Lupus ([place]) 05/23/2016  . 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  ADHD (attention deficit hyperactivity disorder) 12/22/2014  . Raynaud's phenomenon 10/17/2013  . Hyperlipidemia 09/28/2011   Past Medical History:  Diagnosis Date  . ADD (attention deficit disorder)   . Allergy   . Anemia   . Anxiety   . Asthma   . Depression   . Hyperlipidemia   . Ileitis   . Lupus ([place])   . PONV (postoperative nausea and vomiting)    mild nausea   . Raynaud disease     Family History  Problem Relation Age of Onset  . Heart disease Mother 44       CAD/stenting  . Hyperlipidemia Mother   . Arthritis Mother        ostearthritis  . Hypertension Mother   .  Kidney disease Mother   . Irritable bowel syndrome Mother   . Stroke Mother 81       CVA  . Fibromyalgia Mother   . Eczema Mother   . Hyperlipidemia Father   . Neuropathy Father   . Allergies Father        shellfish  . Hyperlipidemia Sister   . Hypertension Brother   . Colon cancer Neg Hx   . Inflammatory bowel disease Neg Hx   . Allergic rhinitis Neg Hx   . Angioedema Neg Hx   . Atopy Neg Hx   . Asthma Neg Hx   . Immunodeficiency Neg Hx   . Urticaria Neg Hx     Past Surgical History:  Procedure Laterality Date  . CARPAL TUNNEL RELEASE Right    08/2019  . CESAREAN SECTION     x 2  . DENTAL SURGERY    . DILITATION & CURRETTAGE/HYSTROSCOPY WITH NOVASURE ABLATION N/A 06/03/2020   Procedure: DILATATION & CURETTAGE/HYSTEROSCOPY WITH NOVASURE ABLATION;  Surgeon: Aletha Halim, MD;  Location: [facility];  Service: Gynecology;  Laterality: N/A;  . ENDOMETRIAL BIOPSY  04/20/2020      . EYE SURGERY Bilateral    cryoretinopexy   Social History   Occupational History  . Not on file  Tobacco Use  . Smoking status: Former Smoker    Packs/day: 0.75    Years: 10.00    Pack years: 7.50    Types: Cigarettes    Quit date: 07/05/2001    Years since quitting: 19.1  . Smokeless tobacco: Never Used  Vaping Use  . Vaping Use: Never used  Substance and Sexual Activity  . Alcohol use: Yes    Alcohol/week: 14.0 standard drinks    Types: 14 Cans of beer per week    Comment: 2 beer daily  . Drug use: Never  . Sexual activity: Yes    Birth control/protection: Condom

## 2020-08-28 ENCOUNTER — Ambulatory Visit (INDEPENDENT_AMBULATORY_CARE_PROVIDER_SITE_OTHER): Payer: BC Managed Care – PPO | Admitting: *Deleted

## 2020-08-28 DIAGNOSIS — J309 Allergic rhinitis, unspecified: Secondary | ICD-10-CM

## 2020-09-01 ENCOUNTER — Ambulatory Visit: Payer: BC Managed Care – PPO | Admitting: Physician Assistant

## 2020-09-01 ENCOUNTER — Other Ambulatory Visit: Payer: Self-pay

## 2020-09-01 ENCOUNTER — Ambulatory Visit: Payer: Self-pay

## 2020-09-01 ENCOUNTER — Ambulatory Visit: Payer: BC Managed Care – PPO

## 2020-09-01 ENCOUNTER — Encounter: Payer: Self-pay | Admitting: Physician Assistant

## 2020-09-01 VITALS — BP 115/76 | HR 77 | Resp 14 | Ht 66.0 in | Wt 157.0 lb

## 2020-09-01 DIAGNOSIS — I7300 Raynaud's syndrome without gangrene: Secondary | ICD-10-CM

## 2020-09-01 DIAGNOSIS — M722 Plantar fascial fibromatosis: Secondary | ICD-10-CM

## 2020-09-01 DIAGNOSIS — M25511 Pain in right shoulder: Secondary | ICD-10-CM

## 2020-09-01 DIAGNOSIS — Z8659 Personal history of other mental and behavioral disorders: Secondary | ICD-10-CM

## 2020-09-01 DIAGNOSIS — M3219 Other organ or system involvement in systemic lupus erythematosus: Secondary | ICD-10-CM

## 2020-09-01 DIAGNOSIS — G8929 Other chronic pain: Secondary | ICD-10-CM

## 2020-09-01 DIAGNOSIS — Z8639 Personal history of other endocrine, nutritional and metabolic disease: Secondary | ICD-10-CM

## 2020-09-01 DIAGNOSIS — K5000 Crohn's disease of small intestine without complications: Secondary | ICD-10-CM

## 2020-09-01 DIAGNOSIS — F900 Attention-deficit hyperactivity disorder, predominantly inattentive type: Secondary | ICD-10-CM

## 2020-09-01 DIAGNOSIS — R5383 Other fatigue: Secondary | ICD-10-CM

## 2020-09-01 DIAGNOSIS — Z79899 Other long term (current) drug therapy: Secondary | ICD-10-CM | POA: Diagnosis not present

## 2020-09-01 DIAGNOSIS — M25552 Pain in left hip: Secondary | ICD-10-CM

## 2020-09-01 DIAGNOSIS — Z9889 Other specified postprocedural states: Secondary | ICD-10-CM

## 2020-09-01 DIAGNOSIS — L52 Erythema nodosum: Secondary | ICD-10-CM | POA: Diagnosis not present

## 2020-09-01 DIAGNOSIS — N924 Excessive bleeding in the premenopausal period: Secondary | ICD-10-CM

## 2020-09-01 DIAGNOSIS — Z8669 Personal history of other diseases of the nervous system and sense organs: Secondary | ICD-10-CM

## 2020-09-02 LAB — CBC WITH DIFFERENTIAL/PLATELET
Absolute Monocytes: 407 cells/uL (ref 200–950)
Basophils Absolute: 21 cells/uL (ref 0–200)
Basophils Relative: 0.5 %
Eosinophils Absolute: 130 cells/uL (ref 15–500)
Eosinophils Relative: 3.1 %
HCT: 41.4 % (ref 35.0–45.0)
Hemoglobin: 13.4 g/dL (ref 11.7–15.5)
Lymphs Abs: 1546 cells/uL (ref 850–3900)
MCH: 31.2 pg (ref 27.0–33.0)
MCHC: 32.4 g/dL (ref 32.0–36.0)
MCV: 96.3 fL (ref 80.0–100.0)
MPV: 10.3 fL (ref 7.5–12.5)
Monocytes Relative: 9.7 %
Neutro Abs: 2096 cells/uL (ref 1500–7800)
Neutrophils Relative %: 49.9 %
Platelets: 273 10*3/uL (ref 140–400)
RBC: 4.3 10*6/uL (ref 3.80–5.10)
RDW: 12.8 % (ref 11.0–15.0)
Total Lymphocyte: 36.8 %
WBC: 4.2 10*3/uL (ref 3.8–10.8)

## 2020-09-02 LAB — COMPLETE METABOLIC PANEL WITH GFR
AG Ratio: 2 (calc) (ref 1.0–2.5)
ALT: 15 U/L (ref 6–29)
AST: 22 U/L (ref 10–35)
Albumin: 4.7 g/dL (ref 3.6–5.1)
Alkaline phosphatase (APISO): 29 U/L — ABNORMAL LOW (ref 31–125)
BUN: 12 mg/dL (ref 7–25)
CO2: 28 mmol/L (ref 20–32)
Calcium: 9.6 mg/dL (ref 8.6–10.2)
Chloride: 101 mmol/L (ref 98–110)
Creat: 0.89 mg/dL (ref 0.50–1.10)
GFR, Est African American: 89 mL/min/{1.73_m2} (ref >=60)
GFR, Est Non African American: 77 mL/min/{1.73_m2} (ref >=60)
Globulin: 2.3 g/dL (calc) (ref 1.9–3.7)
Glucose, Bld: 84 mg/dL (ref 65–99)
Potassium: 4.4 mmol/L (ref 3.5–5.3)
Sodium: 137 mmol/L (ref 135–146)
Total Bilirubin: 0.5 mg/dL (ref 0.2–1.2)
Total Protein: 7 g/dL (ref 6.1–8.1)

## 2020-09-02 LAB — URINALYSIS, ROUTINE W REFLEX MICROSCOPIC
Bilirubin Urine: NEGATIVE
Glucose, UA: NEGATIVE
Hgb urine dipstick: NEGATIVE
Ketones, ur: NEGATIVE
Leukocytes,Ua: NEGATIVE
Nitrite: NEGATIVE
Protein, ur: NEGATIVE
Specific Gravity, Urine: 1.005 (ref 1.001–1.03)
pH: 7.5 (ref 5.0–8.0)

## 2020-09-02 LAB — SEDIMENTATION RATE: Sed Rate: 2 mm/h (ref 0–20)

## 2020-09-02 LAB — ANTI-DNA ANTIBODY, DOUBLE-STRANDED: ds DNA Ab: <1 IU/mL

## 2020-09-02 LAB — C3 AND C4
C3 Complement: 80 mg/dL — ABNORMAL LOW (ref 83–193)
C4 Complement: 21 mg/dL (ref 15–57)

## 2020-09-03 NOTE — Progress Notes (Signed)
C3 is mildly decreased but stable.  All other labs are within normal limits.

## 2020-09-08 ENCOUNTER — Ambulatory Visit (INDEPENDENT_AMBULATORY_CARE_PROVIDER_SITE_OTHER): Payer: BC Managed Care – PPO | Admitting: *Deleted

## 2020-09-08 DIAGNOSIS — J309 Allergic rhinitis, unspecified: Secondary | ICD-10-CM

## 2020-09-10 ENCOUNTER — Other Ambulatory Visit: Payer: Self-pay

## 2020-09-10 ENCOUNTER — Ambulatory Visit: Payer: BC Managed Care – PPO | Attending: Physician Assistant

## 2020-09-10 DIAGNOSIS — M6281 Muscle weakness (generalized): Secondary | ICD-10-CM | POA: Diagnosis not present

## 2020-09-10 DIAGNOSIS — M25652 Stiffness of left hip, not elsewhere classified: Secondary | ICD-10-CM | POA: Diagnosis not present

## 2020-09-10 DIAGNOSIS — G8929 Other chronic pain: Secondary | ICD-10-CM | POA: Insufficient documentation

## 2020-09-10 DIAGNOSIS — M25511 Pain in right shoulder: Secondary | ICD-10-CM | POA: Diagnosis not present

## 2020-09-10 DIAGNOSIS — M25552 Pain in left hip: Secondary | ICD-10-CM

## 2020-09-10 NOTE — Patient Instructions (Signed)
Access Code: [email or web address] URL: [email or web address] Date: 09/10/2020 Prepared by: Claiborne Billings  Exercises Hip Flexor Stretch with Chair - 3 x daily - 7 x weekly - 1 sets - 5 reps - 10 hold Single Leg Stance - 2 x daily - 7 x weekly - 1 sets - 3 reps - 20 hold Forward Step Down Touch with Heel - 2 x daily - 7 x weekly - 1 sets - 10 reps Seated Scapular Retraction - 1 x daily - 7 x weekly - 3 sets - 10 reps Seated Scapular Retraction - 1 x daily - 7 x weekly - 3 sets - 10 reps

## 2020-09-10 NOTE — Therapy (Signed)
[facility] [address] [place], [place], [address] Phone: [phone]   Fax:  [phone]  Physical Therapy Evaluation  Patient Details  Name: Robin [name] MRN: [id number] Date of Birth: 02-28-1971 Referring Provider (PT): Hazel Sams, [facility]   Encounter Date: 09/10/2020   PT End of Session - 09/10/20 1150    Visit Number 1    Date for PT Re-Evaluation 11/05/20    Authorization Type BCBS    PT Start Time 1106    PT Stop Time 1149    PT Time Calculation (min) 43 min    Activity Tolerance Patient tolerated treatment well    Behavior During Therapy [facility] for tasks assessed/performed           Past Medical History:  Diagnosis Date  . ADD (attention deficit disorder)   . Allergy   . Anemia   . Anxiety   . Asthma   . Depression   . Hyperlipidemia   . Ileitis   . Lupus ([place])   . PONV (postoperative nausea and vomiting)    mild nausea   . Raynaud disease     Past Surgical History:  Procedure Laterality Date  . CARPAL TUNNEL RELEASE Right    08/2019  . CESAREAN SECTION     x 2  . DENTAL SURGERY    . DILITATION & CURRETTAGE/HYSTROSCOPY WITH NOVASURE ABLATION N/A 06/03/2020   Procedure: DILATATION & CURETTAGE/HYSTEROSCOPY WITH NOVASURE ABLATION;  Surgeon: Aletha Halim, MD;  Location: [facility];  Service: Gynecology;  Laterality: N/A;  . ENDOMETRIAL BIOPSY  04/20/2020      . EYE SURGERY Bilateral    cryoretinopexy    There were no vitals filed for this visit.    Subjective Assessment - 09/10/20 1108    Subjective Pt presents to PT with chronic history of Rt shoulder and Lt hip pain.  Rt shoulder started bothering her while wearing a brace after carpal tunnel surgery and moving differently due to the brace.  Pt has continued discomfort in the Rt shoulder with repetative use and sleep on the Rt side.  LT hip began to hurt 6-9 months ago with hip flexion, sit to stand and IR/ER.     Pertinent History Lupus, Rt carpal tunnel release    How long can you sit comfortably? 20-30 minutes    How long can you walk comfortably? 5-6 miles- fatigue and pain after    Diagnostic tests x-ray: hip and shoulder- "joint looks good" per pt report    Patient Stated Goals reduce Rt shoulder pain and Lt hip pain    Currently in Pain? Yes    Pain Score 0-No pain   up to 6-7/10 with movement   Pain Location Shoulder    Pain Orientation Right    Pain Descriptors / Indicators Burning;Sharp    Pain Type Chronic pain    Pain Onset More than a month ago    Pain Frequency Intermittent    Aggravating Factors  wiping counter top with Rt UE, reaching, sleep on Rt side, lifting/carrying groceries    Pain Relieving Factors not performing aggravating motion    Multiple Pain Sites Yes    Pain Score 0   up to 8/10  with aggravating factors   Pain Location Hip    Pain Orientation Left    Pain Descriptors / Indicators Burning    Pain Type Chronic pain    Pain Onset More than a month ago    Pain  Frequency Intermittent    Aggravating Factors  hip flexion with walking, lifting leg in/out of car, sit to stand, morning hours    Pain Relieving Factors after getting hip loose, resting after aggravating motion              [facility] PT Assessment - 09/10/20 0001      Assessment   Medical Diagnosis chronic Rt shoulder pain, pain in Lt hip    Referring Provider (PT) Hazel Sams, [facility]    Onset Date/Surgical Date 08/13/19    Hand Dominance Right    Next MD Visit 6 months      Precautions   Precautions None    Precaution Comments Lupus      Restrictions   Weight Bearing Restrictions No      Balance Screen   Has the patient fallen in the past 6 months Yes    How many times? 1   stepped wrong when walking dog   Has the patient had a decrease in activity level because of a fear of falling?  No    Is the patient reluctant to leave their home because of a fear of falling?  No      Home Environment    Living Environment Private residence    Living Arrangements Alone    Type of [place]      Prior Function   Level of Independence Independent    Vocation Full time employment    Vocation Requirements desk work    Leisure walks dog, strength/mobility training      Cognition   Overall Cognitive Status Within Functional Limits for tasks assessed      Observation/Other Assessments   Focus on Therapeutic Outcomes (FOTO)  41 (goal is 62)      Posture/Postural Control   Posture/Postural Control Postural limitations    Postural Limitations Rounded Shoulders      ROM / Strength   AROM / PROM / Strength AROM;Strength;PROM      AROM   Overall AROM  Within functional limits for tasks performed    Overall AROM Comments hip and shoulder A/ROM is WFLs.  Pt with Rt AC joint pain with Rt shoulder flexion and abduction.  Hip flexor pain with hip flexion and IR/ER      PROM   Overall PROM  Within functional limits for tasks performed      Strength   Overall Strength Deficits    Overall Strength Comments Rt=Lt shoulder strength 4+/5 to 5/5.  Rt hip strength 5/5    Strength Assessment Site Hip    Right/Left Hip Left    Left Hip Flexion 4/5   pain   Left Hip Extension 4/5    Left Hip External Rotation 4+/5    Left Hip Internal Rotation 4+/5    Left Hip ABduction 4+/5      Palpation   Palpation comment tension and trigger points in Lt hip flexor and glute medius.  Palpable tenderness over Rt AC joint.  Increased glenohumeral joint mobility anterior and inferior.      Transfers   Transfers Sit to Stand;Stand to Sit;Independent with all Transfers    Comments step down on Lt with reduced end range eccentric step down.  Pelvic elevation on Rt with Lt single limb stance      Ambulation/Gait   Ambulation/Gait Yes    Ambulation/Gait Assistance 7: Independent    Gait Pattern Lateral hip instability  Objective measurements completed on examination: See above  findings.               PT Education - 09/10/20 1150    Education Details Access Code: [email or web address]    Person(s) Educated Patient    Methods Explanation;Demonstration;Handout    Comprehension Verbalized understanding;Returned demonstration            PT Short Term Goals - 09/10/20 1120      PT SHORT TERM GOAL #1   Title be independent in initial HEP    Baseline --    Time 4    Period Weeks    Status New    Target Date 10/08/20      PT SHORT TERM GOAL #3   Title report a 30% reduction in Rt shoulder pain with wiping counter and lifting/carrying objects    Time 4    Period Weeks    Status New    Target Date 10/08/20      PT SHORT TERM GOAL #4   Title report a 30% reduction in Lt hip pain with walking, sit to stand and after sitting for work    Baseline --    Time 4    Period Weeks    Status New    Target Date 10/08/20      PT SHORT TERM GOAL #5   Title --             PT Long Term Goals - 09/10/20 1120      PT LONG TERM GOAL #1   Title ind with advanced HEP    Time 8    Period Weeks    Status New    Target Date 11/05/20      PT LONG TERM GOAL #2   Title FOTO > or = to 62    Baseline 41 at evaluation    Time 8    Period Weeks    Target Date 11/05/20      PT LONG TERM GOAL #3   Title walk dog and perform transitional motions with a 70% reduction in Lt hip pain    Baseline --    Time 8    Status New    Target Date 11/05/20      PT LONG TERM GOAL #4   Title report < or = to 2-3/10 Rt shoulder pain with cleaning, lifting and carrying    Baseline --    Time 8    Period Weeks    Status New    Target Date 11/05/20      PT LONG TERM GOAL #5   Title improve Lt hip strength to perform step down with good eccentric control and neutral hip/level pelvis to improve stability    Baseline --    Time 8    Period Weeks    Status New    Target Date 11/05/20                  Plan - 09/10/20 1224    Clinical Impression Statement Pt has a  history of lupus and presents with chronic Rt shoulder (1 year history) and Lt hip (6-9 month history) pain.  Pt reports 6-7/10 Rt shoulder pain with cleaning, lifting and carrying.  Lt hip pain is 8/10 with walking, sleep and, sit to stand transition.  Pt demonstrates decreased Lt LE hip instability with eccentric motion and lateral hip instability with single leg stance on the Lt.  Pt with reduced global Lt hip strength with  MMT.  Pt with full Rt shoulder A/ROM and strength and reports pain at Rt [facility] joint with overpressure of flexion and abduction.  Pt will benefit from skilled PT to address Lt hip and LE stability and pain and Rt shoulder pain and functional strength with use.    Personal Factors and Comorbidities Comorbidity 2    Comorbidities lupus, hypermobility, Rt CTS release    Examination-Activity Limitations Carry;Bed Mobility;Lift;Stand;Stairs;Squat;Sleep    Examination-Participation Restrictions Community Activity;Laundry;Shop;Occupation    Stability/Clinical Decision Making Evolving/Moderate complexity    Clinical Decision Making Moderate    Rehab Potential Good    PT Frequency 1x / week    PT Duration 8 weeks    PT Treatment/Interventions ADLs/Self Care Home Management;Cryotherapy;Electrical Stimulation;Moist Heat;Stair training;Functional mobility training;Therapeutic activities;Therapeutic exercise;Balance training;Neuromuscular re-education;Manual techniques;Patient/family education;Passive range of motion;Dry needling;Joint Manipulations;Taping    PT Next Visit Plan Lt hip stability, postural strength for Rt shoulder, manual to Lt hip flexor    PT Home Exercise Plan Access Code: [email or web address]    IRSWNIOEV and Agree with Plan of Care Patient           Patient will benefit from skilled therapeutic intervention in order to improve the following deficits and impairments:  Decreased activity tolerance,Decreased strength,Decreased balance,Pain,Increased muscle spasms,Decreased  endurance,Decreased coordination  Visit Diagnosis: Pain in left hip - Plan: PT plan of care cert/re-cert  Chronic right shoulder pain - Plan: PT plan of care cert/re-cert  Muscle weakness (generalized) - Plan: PT plan of care cert/re-cert     Problem List Patient Active Problem List   Diagnosis Date Noted  . Seasonal and perennial allergic rhinitis 11/06/2018  . Mild intermittent asthma, uncomplicated 03/50/0938  . Flexural atopic dermatitis 11/06/2018  . History of hyperlipidemia 11/08/2016  . History of anxiety 11/08/2016  . History of retinal tear 11/08/2016  . History of Bilateral plantar fasciitis 08/10/2016  . High risk medication use 07/04/2016  . Lupus ([place]) 05/23/2016  . 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 ADHD (attention deficit hyperactivity disorder) 12/22/2014  . Raynaud's phenomenon 10/17/2013  . Hyperlipidemia 09/28/2011    Sigurd Sos, PT 09/10/20 12:38 PM  [facility] [address] [place], [place], [address] Phone: [phone]   Fax:  [phone]  Name: ZAYAH KEILMAN MRN: [id number] Date of Birth: [birth year]

## 2020-09-12 HISTORY — PX: ABLATION: SHX5711

## 2020-09-14 DIAGNOSIS — F902 Attention-deficit hyperactivity disorder, combined type: Secondary | ICD-10-CM | POA: Diagnosis not present

## 2020-09-15 ENCOUNTER — Other Ambulatory Visit: Payer: Self-pay

## 2020-09-15 ENCOUNTER — Ambulatory Visit: Payer: BC Managed Care – PPO | Attending: Physician Assistant | Admitting: Physical Therapy

## 2020-09-15 DIAGNOSIS — M25511 Pain in right shoulder: Secondary | ICD-10-CM | POA: Diagnosis not present

## 2020-09-15 DIAGNOSIS — M6281 Muscle weakness (generalized): Secondary | ICD-10-CM | POA: Insufficient documentation

## 2020-09-15 DIAGNOSIS — M25552 Pain in left hip: Secondary | ICD-10-CM | POA: Insufficient documentation

## 2020-09-15 DIAGNOSIS — G8929 Other chronic pain: Secondary | ICD-10-CM | POA: Diagnosis not present

## 2020-09-15 NOTE — Patient Instructions (Signed)
Access Code: [email or web address] URL: [email or web address] Date: 09/15/2020 Prepared by: Ruben Im  Exercises Hip Flexor Stretch with Chair - 3 x daily - 7 x weekly - 1 sets - 5 reps - 10 hold Single Leg Stance - 2 x daily - 7 x weekly - 1 sets - 3 reps - 20 hold Forward Step Down Touch with Heel - 2 x daily - 7 x weekly - 1 sets - 10 reps Seated Scapular Retraction - 1 x daily - 7 x weekly - 3 sets - 10 reps Seated Scapular Retraction - 1 x daily - 7 x weekly - 3 sets - 10 reps Standing Isometric Hip Abduction with Ball on Wall - 1 x daily - 7 x weekly - 1 sets - 10 reps - 5 hold   Trigger Point Dry Needling  . What is Trigger Point Dry Needling (DN)? o DN is a physical therapy technique used to treat muscle pain and dysfunction. Specifically, DN helps deactivate muscle trigger points (muscle knots).  o A thin filiform needle is used to penetrate the skin and stimulate the underlying trigger point. The goal is for a local twitch response (LTR) to occur and for the trigger point to relax. No medication of any kind is injected during the procedure.   . What Does Trigger Point Dry Needling Feel Like?  o The procedure feels different for each individual patient. Some patients report that they do not actually feel the needle enter the skin and overall the process is not painful. Very mild bleeding may occur. However, many patients feel a deep cramping in the muscle in which the needle was inserted. This is the local twitch response.   Marland Kitchen How Will I feel after the treatment? o Soreness is normal, and the onset of soreness may not occur for a few hours. Typically this soreness does not last longer than two days.  o Bruising is uncommon, however; ice can be used to decrease any possible bruising.  o In rare cases feeling tired or nauseous after the treatment is normal. In addition, your symptoms may get worse before they get better, this period will typically not last longer than 24 hours.    . What Can I do After My Treatment? o Increase your hydration by drinking more water for the next 24 hours. o You may place ice or heat on the areas treated that have become sore, however, do not use heat on inflamed or bruised areas. Heat often brings more relief post needling. o You can continue your regular activities, but vigorous activity is not recommended initially after the treatment for 24 hours. o DN is best combined with other physical therapy such as strengthening, stretching, and other therapies.

## 2020-09-15 NOTE — Therapy (Signed)
[facility] [address] [place], [place], [address] Phone: [phone]   Fax:  [phone]  Physical Therapy Treatment  Patient Details  Name: Robin [name] MRN: [id number] Date of Birth: 1971/02/10 Referring Provider (PT): Hazel Sams, [facility]   Encounter Date: 09/15/2020   PT End of Session - 09/15/20 1747    Visit Number 2    Date for PT Re-Evaluation 11/05/20    Authorization Type BCBS    PT Start Time [id number]    PT Stop Time 0931    PT Time Calculation (min) 39 min    Activity Tolerance Patient tolerated treatment well           Past Medical History:  Diagnosis Date  . ADD (attention deficit disorder)   . Allergy   . Anemia   . Anxiety   . Asthma   . Depression   . Hyperlipidemia   . Ileitis   . Lupus ([place])   . PONV (postoperative nausea and vomiting)    mild nausea   . Raynaud disease     Past Surgical History:  Procedure Laterality Date  . CARPAL TUNNEL RELEASE Right    08/2019  . CESAREAN SECTION     x 2  . DENTAL SURGERY    . DILITATION & CURRETTAGE/HYSTROSCOPY WITH NOVASURE ABLATION N/A 06/03/2020   Procedure: DILATATION & CURETTAGE/HYSTEROSCOPY WITH NOVASURE ABLATION;  Surgeon: Aletha Halim, MD;  Location: [facility];  Service: Gynecology;  Laterality: N/A;  . ENDOMETRIAL BIOPSY  04/20/2020      . EYE SURGERY Bilateral    cryoretinopexy    There were no vitals filed for this visit.   Subjective Assessment - 09/15/20 0853    Subjective I'm having a bad muscle spasm in my shoulder (upper trap region).  I was sore after starting the exercises.  I'm not sure if I'm doing the step downs correctly.  I"ve had DN for plantar fascitis which helped a lot in the past!  Hip is better with exercise  but hurts lifting leg in/out of the car.  Can cause back spasm.  I'm hypermobile generally.    Pertinent History Lupus, Rt carpal tunnel release    Currently in Pain? Yes    Pain  Location Neck    Pain Orientation Right                             [facility] Adult PT Treatment/Exercise - 09/15/20 0001      Knee/Hip Exercises: Standing   Step Down Right;Left;1 set;10 reps;Step Height: 4"    Step Down Limitations with mirror feedback    Other Standing Knee Exercises hip abduction ball on wall isometric 5 sec hold 10x right/left   cues to bend knee on stance leg     Shoulder Exercises: Seated   Other Seated Exercises review of previous HEP      Moist Heat Therapy   Number Minutes Moist Heat 3 Minutes   concurrent with hip mobs   Moist Heat Location Shoulder   after Dn     Manual Therapy   Joint Mobilization left long axis distraction, inferior; AP in internal rotation grade 3 30 sec each    Soft tissue mobilization right levator scap, upper trap            Trigger Point Dry Needling - 09/15/20 0001    Consent Given? Yes    Education Handout Provided Yes  Muscles Treated Head and Neck Upper trapezius;Levator scapulae    Other Dry Needling right only    Upper Trapezius Response Twitch reponse elicited;Palpable increased muscle length    Levator Scapulae Response Twitch response elicited;Palpable increased muscle length                PT Education - 09/15/20 1747    Education Details dry needling after care; ball on wall hip abduction isometric    Person(s) Educated Patient    Methods Explanation;Demonstration;Handout    Comprehension Returned demonstration;Verbalized understanding            PT Short Term Goals - 09/10/20 1120      PT SHORT TERM GOAL #1   Title be independent in initial HEP    Baseline --    Time 4    Period Weeks    Status New    Target Date 10/08/20      PT SHORT TERM GOAL #3   Title report a 30% reduction in Rt shoulder pain with wiping counter and lifting/carrying objects    Time 4    Period Weeks    Status New    Target Date 10/08/20      PT SHORT TERM GOAL #4   Title report a 30% reduction  in Lt hip pain with walking, sit to stand and after sitting for work    Baseline --    Time 4    Period Weeks    Status New    Target Date 10/08/20      PT SHORT TERM GOAL #5   Title --             PT Long Term Goals - 09/10/20 1120      PT LONG TERM GOAL #1   Title ind with advanced HEP    Time 8    Period Weeks    Status New    Target Date 11/05/20      PT LONG TERM GOAL #2   Title FOTO > or = to 62    Baseline 41 at evaluation    Time 8    Period Weeks    Target Date 11/05/20      PT LONG TERM GOAL #3   Title walk dog and perform transitional motions with a 70% reduction in Lt hip pain    Baseline --    Time 8    Status New    Target Date 11/05/20      PT LONG TERM GOAL #4   Title report < or = to 2-3/10 Rt shoulder pain with cleaning, lifting and carrying    Baseline --    Time 8    Period Weeks    Status New    Target Date 11/05/20      PT LONG TERM GOAL #5   Title improve Lt hip strength to perform step down with good eccentric control and neutral hip/level pelvis to improve stability    Baseline --    Time 8    Period Weeks    Status New    Target Date 11/05/20                 Plan - 09/15/20 1749    Clinical Impression Statement The patient complains of a tender point in right shoulder that especially bothers her today.  She is very receptive to DN since she had excellent success with in in treating her plantar fascitis in the past.   Multiple twitch  responses and much improved soft tissue mobility following DN which is a good prognostic indicator although she is quite sore afterwards.  Review of initial HEP with verbal cues for patella-femoral alignment with step downs needed.  Initiated glute medius strengthening with cues to avoid knee hyperextension on stance leg.    Comorbidities lupus, hypermobility, Rt CTS release    Examination-Activity Limitations Carry;Bed Mobility;Lift;Stand;Stairs;Squat;Sleep    Examination-Participation  Restrictions Community Activity;Laundry;Shop;Occupation    Rehab Potential Good    PT Frequency 1x / week    PT Duration 8 weeks    PT Treatment/Interventions ADLs/Self Care Home Management;Cryotherapy;Electrical Stimulation;Moist Heat;Stair training;Functional mobility training;Therapeutic activities;Therapeutic exercise;Balance training;Neuromuscular re-education;Manual techniques;Patient/family education;Passive range of motion;Dry needling;Joint Manipulations;Taping    PT Next Visit Plan assess response to DN or right levator and upper traps muscles;  Lt hip stability, postural strength for Rt shoulder, manual to Lt hip flexor    PT Home Exercise Plan Access Code: [email or web address]           Patient will benefit from skilled therapeutic intervention in order to improve the following deficits and impairments:  Decreased activity tolerance,Decreased strength,Decreased balance,Pain,Increased muscle spasms,Decreased endurance,Decreased coordination  Visit Diagnosis: Pain in left hip  Chronic right shoulder pain  Muscle weakness (generalized)     Problem List Patient Active Problem List   Diagnosis Date Noted  . Seasonal and perennial allergic rhinitis 11/06/2018  . Mild intermittent asthma, uncomplicated 66/44/0347  . Flexural atopic dermatitis 11/06/2018  . History of hyperlipidemia 11/08/2016  . History of anxiety 11/08/2016  . History of retinal tear 11/08/2016  . History of Bilateral plantar fasciitis 08/10/2016  . High risk medication use 07/04/2016  . Lupus ([place]) 05/23/2016  . 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 ADHD (attention deficit hyperactivity disorder) 12/22/2014  . Raynaud's phenomenon 10/17/2013  . Hyperlipidemia 09/28/2011   Ruben Im, PT 09/15/20 5:55 PM Phone: [phone] Fax: [phone] Alvera Singh 09/15/2020, 5:55 PM  [facility] [address] [place], [place], [address] Phone:  [phone]   Fax:  [phone]  Name: Robin [name] MRN: [id number] Date of Birth: 04-Mar-1971

## 2020-09-17 ENCOUNTER — Ambulatory Visit (INDEPENDENT_AMBULATORY_CARE_PROVIDER_SITE_OTHER): Payer: BC Managed Care – PPO

## 2020-09-17 DIAGNOSIS — J309 Allergic rhinitis, unspecified: Secondary | ICD-10-CM | POA: Diagnosis not present

## 2020-09-22 DIAGNOSIS — Z03818 Encounter for observation for suspected exposure to other biological agents ruled out: Secondary | ICD-10-CM | POA: Diagnosis not present

## 2020-09-23 ENCOUNTER — Encounter: Payer: Self-pay | Admitting: Physical Therapy

## 2020-09-23 ENCOUNTER — Other Ambulatory Visit: Payer: Self-pay

## 2020-09-23 ENCOUNTER — Ambulatory Visit: Payer: BC Managed Care – PPO | Admitting: Physical Therapy

## 2020-09-23 ENCOUNTER — Encounter: Payer: Self-pay | Admitting: Rheumatology

## 2020-09-23 DIAGNOSIS — M25552 Pain in left hip: Secondary | ICD-10-CM

## 2020-09-23 DIAGNOSIS — M6281 Muscle weakness (generalized): Secondary | ICD-10-CM | POA: Diagnosis not present

## 2020-09-23 DIAGNOSIS — G8929 Other chronic pain: Secondary | ICD-10-CM | POA: Diagnosis not present

## 2020-09-23 DIAGNOSIS — M25511 Pain in right shoulder: Secondary | ICD-10-CM | POA: Diagnosis not present

## 2020-09-23 NOTE — Therapy (Signed)
[facility] [address], [place], [place], [address] Phone: [phone]   Fax:  [phone]  Physical Therapy Treatment  Patient Details  Name: Robin [name] MRN: [id number] Date of Birth: 08/10/1971 Referring Provider (PT): Hazel Sams, [facility]   Encounter Date: 09/23/2020   PT End of Session - 09/23/20 0802    Visit Number 3    Date for PT Re-Evaluation 11/05/20    Authorization Type BCBS    PT Start Time 0803    PT Stop Time 0856    PT Time Calculation (min) 53 min    Activity Tolerance Patient tolerated treatment well    Behavior During Therapy [facility] for tasks assessed/performed           Past Medical History:  Diagnosis Date  . ADD (attention deficit disorder)   . Allergy   . Anemia   . Anxiety   . Asthma   . Depression   . Hyperlipidemia   . Ileitis   . Lupus ([place])   . PONV (postoperative nausea and vomiting)    mild nausea   . Raynaud disease     Past Surgical History:  Procedure Laterality Date  . CARPAL TUNNEL RELEASE Right    08/2019  . CESAREAN SECTION     x 2  . DENTAL SURGERY    . DILITATION & CURRETTAGE/HYSTROSCOPY WITH NOVASURE ABLATION N/A 06/03/2020   Procedure: DILATATION & CURETTAGE/HYSTEROSCOPY WITH NOVASURE ABLATION;  Surgeon: Aletha Halim, MD;  Location: [place];  Service: Gynecology;  Laterality: N/A;  . ENDOMETRIAL BIOPSY  04/20/2020      . EYE SURGERY Bilateral    cryoretinopexy    There were no vitals filed for this visit.   Subjective Assessment - 09/23/20 0803    Subjective It helped the inflammation lower in the shoulder. Able to do hip exercises but not sure if doing correctly. Back at work and sitting more so gluteals feel tight especially on right.    Pertinent History Lupus, Rt carpal tunnel release    How long can you sit comfortably? 20-30 minutes    How long can you walk comfortably? 5-6 miles- fatigue and pain after    Diagnostic tests  x-ray: hip and shoulder- "joint looks good" per pt report    Patient Stated Goals reduce Rt shoulder pain and Lt hip pain    Currently in Pain? No/denies    Pain Score 4    Pain Location Hip    Pain Orientation Left    Pain Descriptors / Indicators Sore    Pain Type Chronic pain                             [facility] Adult PT Treatment/Exercise - 09/23/20 0001      Exercises   Exercises Knee/Hip      Knee/Hip Exercises: Stretches   Hip Flexor Stretch Both;20 seconds;3 reps    Other Knee/Hip Stretches hip Adductor stretch in forward fold wide legs and thoracic rotation x 5 bil      Knee/Hip Exercises: Standing   Step Down Both;20 reps;Hand Hold: 0;Step Height: 4"    Step Down Limitations left cued with yellow and pulling medially on thigh for 10    Other Standing Knee Exercises hip abduction ball on wall isometric 10 sec hold 5x right/left   cues to bend knee on stance leg     Moist Heat Therapy   Number  Minutes Moist Heat 10 Minutes    Moist Heat Location Cervical;Hip      Manual Therapy   Manual Therapy Soft tissue mobilization    Manual therapy comments Skilled palpation and monitoring of soft tissues during DN    Soft tissue mobilization to bil gluteals            Trigger Point Dry Needling - 09/23/20 0001    Consent Given? Yes    Education Handout Provided Previously provided    Muscles Treated Head and Neck Sternocleidomastoid    Muscles Treated Back/Hip Gluteus minimus;Gluteus medius;Gluteus maximus;Piriformis    Other Dry Needling bil    Upper Trapezius Response Twitch reponse elicited;Palpable increased muscle length    Levator Scapulae Response Twitch response elicited;Palpable increased muscle length    Gluteus Minimus Response Twitch response elicited;Palpable increased muscle length    Gluteus Medius Response Twitch response elicited;Palpable increased muscle length    Gluteus Maximus Response Twitch response elicited;Palpable increased muscle  length    Piriformis Response Twitch response elicited;Palpable increased muscle length                  PT Short Term Goals - 09/10/20 1120      PT SHORT TERM GOAL #1   Title be independent in initial HEP    Baseline --    Time 4    Period Weeks    Status New    Target Date 10/08/20      PT SHORT TERM GOAL #3   Title report a 30% reduction in Rt shoulder pain with wiping counter and lifting/carrying objects    Time 4    Period Weeks    Status New    Target Date 10/08/20      PT SHORT TERM GOAL #4   Title report a 30% reduction in Lt hip pain with walking, sit to stand and after sitting for work    Baseline --    Time 4    Period Weeks    Status New    Target Date 10/08/20      PT SHORT TERM GOAL #5   Title --             PT Long Term Goals - 09/10/20 1120      PT LONG TERM GOAL #1   Title ind with advanced HEP    Time 8    Period Weeks    Status New    Target Date 11/05/20      PT LONG TERM GOAL #2   Title FOTO > or = to 62    Baseline 41 at evaluation    Time 8    Period Weeks    Target Date 11/05/20      PT LONG TERM GOAL #3   Title walk dog and perform transitional motions with a 70% reduction in Lt hip pain    Baseline --    Time 8    Status New    Target Date 11/05/20      PT LONG TERM GOAL #4   Title report < or = to 2-3/10 Rt shoulder pain with cleaning, lifting and carrying    Baseline --    Time 8    Period Weeks    Status New    Target Date 11/05/20      PT LONG TERM GOAL #5   Title improve Lt hip strength to perform step down with good eccentric control and neutral hip/level pelvis to improve stability  Baseline --    Time 8    Period Weeks    Status New    Target Date 11/05/20                 Plan - 09/23/20 0849    Clinical Impression Statement Patient reports significant relief and increased shoulder mobility following DN. She states that her exercises are causing increased tenderness and tightness in her  gluteals right > left. We reviewed her HEP again and quad control improves with step downs as reps increase. Wall/ball ABD was modified so she felt it more in her glutes. Initial trial of DN to bil gluteals with excellent response. She had trigger points bil as well as in bil UT.    PT Frequency 1x / week    PT Duration 8 weeks    PT Treatment/Interventions ADLs/Self Care Home Management;Cryotherapy;Electrical Stimulation;Moist Heat;Stair training;Functional mobility training;Therapeutic activities;Therapeutic exercise;Balance training;Neuromuscular re-education;Manual techniques;Patient/family education;Passive range of motion;Dry needling;Joint Manipulations;Taping    PT Next Visit Plan assess response to DN;  Lt hip stability, postural strength for Rt shoulder, manual to Lt hip flexor    PT Home Exercise Plan Access Code: [email or web address]    Consulted and Agree with Plan of Care Patient           Patient will benefit from skilled therapeutic intervention in order to improve the following deficits and impairments:  Decreased activity tolerance,Decreased strength,Decreased balance,Pain,Increased muscle spasms,Decreased endurance,Decreased coordination  Visit Diagnosis: Pain in left hip  Chronic right shoulder pain  Muscle weakness (generalized)     Problem List Patient Active Problem List   Diagnosis Date Noted  . Seasonal and perennial allergic rhinitis 11/06/2018  . Mild intermittent asthma, uncomplicated 20/06/711  . Flexural atopic dermatitis 11/06/2018  . History of hyperlipidemia 11/08/2016  . History of anxiety 11/08/2016  . History of retinal tear 11/08/2016  . History of Bilateral plantar fasciitis 08/10/2016  . High risk medication use 07/04/2016  . Lupus ([place]) 05/23/2016  . 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 ADHD (attention deficit hyperactivity disorder) 12/22/2014  . Raynaud's phenomenon 10/17/2013  . Hyperlipidemia 09/28/2011    Madelyn Flavors PT 09/23/2020, 12:54  PM   [facility] [address], [place], [place], [address] Phone: [phone]   Fax:  [phone]  Name: Robin [name] MRN: [id number] Date of Birth: 1970/10/12

## 2020-09-24 ENCOUNTER — Ambulatory Visit (INDEPENDENT_AMBULATORY_CARE_PROVIDER_SITE_OTHER): Payer: BC Managed Care – PPO | Admitting: Orthopaedic Surgery

## 2020-09-24 ENCOUNTER — Encounter: Payer: Self-pay | Admitting: Orthopaedic Surgery

## 2020-09-24 VITALS — Ht 66.0 in | Wt 150.0 lb

## 2020-09-24 DIAGNOSIS — M25552 Pain in left hip: Secondary | ICD-10-CM | POA: Diagnosis not present

## 2020-09-24 NOTE — Progress Notes (Signed)
Office Visit Note   Patient: Robin [name]           Date of Birth: 1970/09/16           MRN: [id number] Visit Date: 09/24/2020              Requested by: Daleen Squibb, MD [place],  [place] [address] PCP: Jacelyn Pi, Lilia Argue, MD   Assessment & Plan: Visit Diagnoses:  1. Pain in left hip     Plan: Impression is left hip questionable labral tear versus hip strain. Based on her clinical exam I believe it is appropriate to proceed with a diagnostic and hopefully therapeutic intra-articular cortisone injection to the left hip. She was referred to Dr. Junius Roads for this. She will follow-up with [place] as needed.  Follow-Up Instructions: Return if symptoms worsen or fail to improve.   Orders:  No orders of the defined types were placed in this encounter.  No orders of the defined types were placed in this encounter.     Procedures: No procedures performed   Clinical Data: No additional findings.   Subjective: Chief Complaint  Patient presents with  . Left Hip - Pain    HPI patient is a pleasant 50 year old female who comes in today with left hip pain for the past 6 to 9 months. She denies any injury or change in activity leading up to her symptoms. The pain she has is primarily to the anterior thigh and into the groin but notes that she is having pain into the buttocks as well. She has increased pain with hip flexion to include getting out of her car, running and going from a seated to standing position. She does get mild relief with walking. She does take an occasional NSAID with mild relief of symptoms. She has most recently been seen by her rheumatologist who she sees for her lupus where x-rays of the left hip were obtained. These were negative for any acute findings. She has been in physical therapy for the past 3 weeks for which she has noticed recent mild relief after dry needling. She has not previously had a left hip injection.  Review of  Systems as detailed in HPI. All others reviewed and are negative.  Objective: Vital Signs: Ht 5' 6"  (1.676 m)   Wt 150 lb (68 kg)   BMI 24.21 kg/m   Physical Exam well-developed well-nourished female no acute distress. Alert and oriented x3.  Ortho Exam left hip exam shows a negative logroll. Positive FADIR. She denies any pain with hip abduction or abduction. No tenderness to the greater trochanter. She is neurovascular intact distally.  Specialty Comments:  No specialty comments available.  Imaging: No new imaging   PMFS History: Patient Active Problem List   Diagnosis Date Noted  . Seasonal and perennial allergic rhinitis 11/06/2018  . Mild intermittent asthma, uncomplicated 53/29/9242  . Flexural atopic dermatitis 11/06/2018  . History of hyperlipidemia 11/08/2016  . History of anxiety 11/08/2016  . History of retinal tear 11/08/2016  . History of Bilateral plantar fasciitis 08/10/2016  . High risk medication use 07/04/2016  . Lupus ([place]) 05/23/2016  . 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 ADHD (attention deficit hyperactivity disorder) 12/22/2014  . Raynaud's phenomenon 10/17/2013  . Hyperlipidemia 09/28/2011   Past Medical History:  Diagnosis Date  . ADD (attention deficit disorder)   . Allergy   . Anemia   . Anxiety   . Asthma   .  Depression   . Hyperlipidemia   . Ileitis   . Lupus ([place])   . PONV (postoperative nausea and vomiting)    mild nausea   . Raynaud disease     Family History  Problem Relation Age of Onset  . Heart disease Mother 40       CAD/stenting  . Hyperlipidemia Mother   . Arthritis Mother        ostearthritis  . Hypertension Mother   . Kidney disease Mother   . Irritable bowel syndrome Mother   . Stroke Mother 5       CVA  . Fibromyalgia Mother   . Eczema Mother   . Hyperlipidemia Father   . Neuropathy Father   . Allergies Father        shellfish  . Hyperlipidemia Sister   . Hypertension Brother   . Colon cancer Neg Hx    . Inflammatory bowel disease Neg Hx   . Allergic rhinitis Neg Hx   . Angioedema Neg Hx   . Atopy Neg Hx   . Asthma Neg Hx   . Immunodeficiency Neg Hx   . Urticaria Neg Hx     Past Surgical History:  Procedure Laterality Date  . CARPAL TUNNEL RELEASE Right    08/2019  . CESAREAN SECTION     x 2  . DENTAL SURGERY    . DILITATION & CURRETTAGE/HYSTROSCOPY WITH NOVASURE ABLATION N/A 06/03/2020   Procedure: DILATATION & CURETTAGE/HYSTEROSCOPY WITH NOVASURE ABLATION;  Surgeon: Aletha Halim, MD;  Location: [facility];  Service: Gynecology;  Laterality: N/A;  . ENDOMETRIAL BIOPSY  04/20/2020      . EYE SURGERY Bilateral    cryoretinopexy   Social History   Occupational History  . Not on file  Tobacco Use  . Smoking status: Former Smoker    Packs/day: 0.75    Years: 10.00    Pack years: 7.50    Types: Cigarettes    Quit date: 07/05/2001    Years since quitting: 19.2  . Smokeless tobacco: Never Used  Vaping Use  . Vaping Use: Never used  Substance and Sexual Activity  . Alcohol use: Yes    Alcohol/week: 14.0 standard drinks    Types: 14 Cans of beer per week    Comment: 2 beer daily  . Drug use: Never  . Sexual activity: Yes    Birth control/protection: Condom

## 2020-09-24 NOTE — Telephone Encounter (Signed)
I called patient and discussed that she already had 3 vaccines which were in March x2 and 1 in September.  She should wait 6 months and get a booster.  I told her there is no guidelines about the second booster yet.  There are no recommendations about obtaining antibody titers.

## 2020-09-25 DIAGNOSIS — J309 Allergic rhinitis, unspecified: Secondary | ICD-10-CM | POA: Diagnosis not present

## 2020-09-28 DIAGNOSIS — F339 Major depressive disorder, recurrent, unspecified: Secondary | ICD-10-CM | POA: Diagnosis not present

## 2020-09-28 DIAGNOSIS — F902 Attention-deficit hyperactivity disorder, combined type: Secondary | ICD-10-CM | POA: Diagnosis not present

## 2020-09-28 DIAGNOSIS — F411 Generalized anxiety disorder: Secondary | ICD-10-CM | POA: Diagnosis not present

## 2020-09-29 ENCOUNTER — Ambulatory Visit (INDEPENDENT_AMBULATORY_CARE_PROVIDER_SITE_OTHER): Payer: BC Managed Care – PPO

## 2020-09-29 DIAGNOSIS — J309 Allergic rhinitis, unspecified: Secondary | ICD-10-CM

## 2020-09-30 ENCOUNTER — Encounter: Payer: Self-pay | Admitting: Physical Therapy

## 2020-09-30 ENCOUNTER — Other Ambulatory Visit: Payer: Self-pay

## 2020-09-30 ENCOUNTER — Ambulatory Visit: Payer: BC Managed Care – PPO | Admitting: Physical Therapy

## 2020-09-30 ENCOUNTER — Encounter: Payer: BC Managed Care – PPO | Admitting: Physical Therapy

## 2020-09-30 DIAGNOSIS — M6281 Muscle weakness (generalized): Secondary | ICD-10-CM

## 2020-09-30 DIAGNOSIS — M25511 Pain in right shoulder: Secondary | ICD-10-CM

## 2020-09-30 DIAGNOSIS — G8929 Other chronic pain: Secondary | ICD-10-CM

## 2020-09-30 DIAGNOSIS — M25552 Pain in left hip: Secondary | ICD-10-CM | POA: Diagnosis not present

## 2020-09-30 NOTE — Therapy (Signed)
[facility] [address], [place], [place], [address] Phone: [phone]   Fax:  [phone]  Physical Therapy Treatment  Patient Details  Name: Robin [name] MRN: [id number] Date of Birth: [birth year] Referring Provider (PT): Hazel Sams, [facility]   Encounter Date: 09/30/2020   PT End of Session - 09/30/20 0810    Visit Number 4    Date for PT Re-Evaluation 11/05/20    Authorization Type BCBS    PT Start Time 0809    PT Stop Time 6712   heat end of session   PT Time Calculation (min) 46 min    Activity Tolerance Patient tolerated treatment well    Behavior During Therapy [facility] for tasks assessed/performed           Past Medical History:  Diagnosis Date  . ADD (attention deficit disorder)   . Allergy   . Anemia   . Anxiety   . Asthma   . Depression   . Hyperlipidemia   . Ileitis   . Lupus ([place])   . PONV (postoperative nausea and vomiting)    mild nausea   . Raynaud disease     Past Surgical History:  Procedure Laterality Date  . CARPAL TUNNEL RELEASE Right    08/2019  . CESAREAN SECTION     x 2  . DENTAL SURGERY    . DILITATION & CURRETTAGE/HYSTROSCOPY WITH NOVASURE ABLATION N/A 06/03/2020   Procedure: DILATATION & CURETTAGE/HYSTEROSCOPY WITH NOVASURE ABLATION;  Surgeon: Aletha Halim, MD;  Location: [place];  Service: Gynecology;  Laterality: N/A;  . ENDOMETRIAL BIOPSY  04/20/2020      . EYE SURGERY Bilateral    cryoretinopexy    There were no vitals filed for this visit.   Subjective Assessment - 09/30/20 0813    Subjective Dry needling was great. Saw ortho MD who reported to pt she may have a labral tear in hip and she might need a cortisone shot. That is scheduled for this Friday.  Shoulder is still sore.    Pertinent History Lupus, Rt carpal tunnel release    Currently in Pain? Yes   Rt shoulder is sore: 3-4/10 with popping: LT hip sore and stiff 3-4/10.   Aggravating  Factors  Not walking    Pain Relieving Factors DN, meds, walking                             [facility] Adult PT Treatment/Exercise - 09/30/20 0001      Exercises   Exercises Neck;Knee/Hip      Neck Exercises: Seated   Other Seated Exercise Rt SCM stretch 1x30 with manual tacking of distal aspect of muscle, demo and then Pt performed      Knee/Hip Exercises: Stretches   Piriformis Stretch Both;2 reps;30 seconds    Piriformis Stretch Limitations supine      Knee/Hip Exercises: Aerobic   Nustep L2 x 5 min for tissue prep with PTA present to discuss current status      Modalities   Modalities Moist Heat      Moist Heat Therapy   Number Minutes Moist Heat 10 Minutes    Moist Heat Location Cervical;Hip      Manual Therapy   Manual Therapy Soft tissue mobilization;Joint mobilization    Manual therapy comments Skilled palpation and monitoring of soft tissues during DN    Joint Mobilization Lt thoracic UPAs and rib springing T3-T6 Gr II/III  Soft tissue mobilization to Lt glut med and piriformis, bil upper traps            Trigger Point Dry Needling - 09/30/20 0001    Consent Given? Yes    Education Handout Provided Previously provided    Muscles Treated Head and Neck Upper trapezius    Other Dry Needling bil for all    Upper Trapezius Response Twitch reponse elicited;Palpable increased muscle length    Gluteus Medius Response Twitch response elicited;Palpable increased muscle length    Piriformis Response Twitch response elicited;Palpable increased muscle length                  PT Short Term Goals - 09/10/20 1120      PT SHORT TERM GOAL #1   Title be independent in initial HEP    Baseline --    Time 4    Period Weeks    Status New    Target Date 10/08/20      PT SHORT TERM GOAL #3   Title report a 30% reduction in Rt shoulder pain with wiping counter and lifting/carrying objects    Time 4    Period Weeks    Status New    Target Date  10/08/20      PT SHORT TERM GOAL #4   Title report a 30% reduction in Lt hip pain with walking, sit to stand and after sitting for work    Baseline --    Time 4    Period Weeks    Status New    Target Date 10/08/20      PT SHORT TERM GOAL #5   Title --             PT Long Term Goals - 09/10/20 1120      PT LONG TERM GOAL #1   Title ind with advanced HEP    Time 8    Period Weeks    Status New    Target Date 11/05/20      PT LONG TERM GOAL #2   Title FOTO > or = to 62    Baseline 41 at evaluation    Time 8    Period Weeks    Target Date 11/05/20      PT LONG TERM GOAL #3   Title walk dog and perform transitional motions with a 70% reduction in Lt hip pain    Baseline --    Time 8    Status New    Target Date 11/05/20      PT LONG TERM GOAL #4   Title report < or = to 2-3/10 Rt shoulder pain with cleaning, lifting and carrying    Baseline --    Time 8    Period Weeks    Status New    Target Date 11/05/20      PT LONG TERM GOAL #5   Title improve Lt hip strength to perform step down with good eccentric control and neutral hip/level pelvis to improve stability    Baseline --    Time 8    Period Weeks    Status New    Target Date 11/05/20                 Plan - 09/30/20 0810    Clinical Impression Statement Pt arrived 10 min late and was worked into schedule as her appointment had previously been canceled due to provider out.  Pt reports signif improvement in mobility since DN.  She has ongoing weakness in bil hips.  She presented with tension in Rt SCM for which PT showed SCM stretch.  Pt has mild TPs remaining in bil upper traps and Rt hip with more signif tension remaining in Lt lateral hip.  PT performed DN to bil hips and upper traps today to address remaining tension and TPs.  Pt with good tolerance followed by STM and stretching.  Continue working toward improved elongation of soft tissues and stability of bil hips and shoulder girdles.     Personal Factors and Comorbidities Comorbidity 2    Comorbidities lupus, hypermobility, Rt CTS release    Examination-Activity Limitations Carry;Bed Mobility;Lift;Stand;Stairs;Squat;Sleep    Stability/Clinical Decision Making Evolving/Moderate complexity    Rehab Potential Good    PT Frequency 1x / week    PT Duration 8 weeks    PT Treatment/Interventions ADLs/Self Care Home Management;Cryotherapy;Electrical Stimulation;Moist Heat;Stair training;Functional mobility training;Therapeutic activities;Therapeutic exercise;Balance training;Neuromuscular re-education;Manual techniques;Patient/family education;Passive range of motion;Dry needling;Joint Manipulations;Taping    PT Next Visit Plan assess response to DN;  Lt hip stability, postural strength for Rt shoulder, manual to Lt hip flexor    PT Home Exercise Plan Access Code: [email or web address]    Consulted and Agree with Plan of Care Patient           Patient will benefit from skilled therapeutic intervention in order to improve the following deficits and impairments:  Decreased activity tolerance,Decreased strength,Decreased balance,Pain,Increased muscle spasms,Decreased endurance,Decreased coordination  Visit Diagnosis: Pain in left hip  Chronic right shoulder pain  Muscle weakness (generalized)     Problem List Patient Active Problem List   Diagnosis Date Noted  . Seasonal and perennial allergic rhinitis 11/06/2018  . Mild intermittent asthma, uncomplicated 78/67/5449  . Flexural atopic dermatitis 11/06/2018  . History of hyperlipidemia 11/08/2016  . History of anxiety 11/08/2016  . History of retinal tear 11/08/2016  . History of Bilateral plantar fasciitis 08/10/2016  . High risk medication use 07/04/2016  . Lupus ([place]) 05/23/2016  . 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 ADHD (attention deficit hyperactivity disorder) 12/22/2014  . Raynaud's phenomenon 10/17/2013  . Hyperlipidemia 09/28/2011    Robin [name] E Rahmir Beever 09/30/2020,  9:28 AM  [facility] [address] [place], [place], [address] Phone: [phone]   Fax:  [phone]  Name: Robin [name] MRN: [id number] Date of Birth: 08/17/1971

## 2020-10-02 ENCOUNTER — Ambulatory Visit: Payer: BC Managed Care – PPO | Admitting: Family Medicine

## 2020-10-05 ENCOUNTER — Ambulatory Visit: Payer: BC Managed Care – PPO | Admitting: Family Medicine

## 2020-10-05 ENCOUNTER — Ambulatory Visit: Payer: Self-pay

## 2020-10-05 ENCOUNTER — Other Ambulatory Visit: Payer: Self-pay

## 2020-10-05 DIAGNOSIS — M25552 Pain in left hip: Secondary | ICD-10-CM | POA: Diagnosis not present

## 2020-10-05 NOTE — Progress Notes (Signed)
Subjective: Patient is here for ultrasound-guided intra-articular left hip injection.   This is a diagnostic and hopefully therapeutic injection.  Ongoing anterior pain, possible labrum tear.  Objective: She has good range of motion but she does have pain with passive flexion and internal rotation.  Procedure: Ultrasound guided injection is preferred based studies that show increased duration, increased effect, greater accuracy, decreased procedural pain, increased response rate, and decreased cost with ultrasound guided versus blind injection.   Verbal informed consent obtained.  Time-out conducted.  Noted no overlying erythema, induration, or other signs of local infection. Ultrasound-guided left hip injection: After sterile prep with Betadine, injected 4 cc 0.25% bupivacaine without epinephrine and 6 mg betamethasone using a 22-gauge spinal needle, passing the needle through the iliofemoral ligament into the femoral head/neck junction.  Injectate seen filling the joint capsule.  Not a lot of relief during the immediate anesthetic phase.  She will follow up with Dr. Erlinda Hong as scheduled.

## 2020-10-07 ENCOUNTER — Ambulatory Visit: Payer: BC Managed Care – PPO | Admitting: Physical Therapy

## 2020-10-07 ENCOUNTER — Other Ambulatory Visit: Payer: Self-pay

## 2020-10-07 ENCOUNTER — Encounter: Payer: Self-pay | Admitting: Physical Therapy

## 2020-10-07 DIAGNOSIS — M25511 Pain in right shoulder: Secondary | ICD-10-CM | POA: Diagnosis not present

## 2020-10-07 DIAGNOSIS — G8929 Other chronic pain: Secondary | ICD-10-CM | POA: Diagnosis not present

## 2020-10-07 DIAGNOSIS — M25552 Pain in left hip: Secondary | ICD-10-CM

## 2020-10-07 DIAGNOSIS — M6281 Muscle weakness (generalized): Secondary | ICD-10-CM | POA: Diagnosis not present

## 2020-10-07 NOTE — Therapy (Signed)
[facility] [address], [place], [place], [address] Phone: [phone]   Fax:  [phone]  Physical Therapy Treatment  Patient Details  Name: Robin [name] MRN: [id number] Date of Birth: January 02, 1971 Referring Provider (PT): Hazel Sams, [facility]   Encounter Date: 10/07/2020   PT End of Session - 10/07/20 0817    Visit Number 5    Date for PT Re-Evaluation 11/05/20    Authorization Type BCBS    PT Start Time 0817    PT Stop Time 0845    PT Time Calculation (min) 28 min    Activity Tolerance Patient tolerated treatment well    Behavior During Therapy [facility] for tasks assessed/performed           Past Medical History:  Diagnosis Date  . ADD (attention deficit disorder)   . Allergy   . Anemia   . Anxiety   . Asthma   . Depression   . Hyperlipidemia   . Ileitis   . Lupus ([place])   . PONV (postoperative nausea and vomiting)    mild nausea   . Raynaud disease     Past Surgical History:  Procedure Laterality Date  . CARPAL TUNNEL RELEASE Right    08/2019  . CESAREAN SECTION     x 2  . DENTAL SURGERY    . DILITATION & CURRETTAGE/HYSTROSCOPY WITH NOVASURE ABLATION N/A 06/03/2020   Procedure: DILATATION & CURETTAGE/HYSTEROSCOPY WITH NOVASURE ABLATION;  Surgeon: Aletha Halim, MD;  Location: [place];  Service: Gynecology;  Laterality: N/A;  . ENDOMETRIAL BIOPSY  04/20/2020      . EYE SURGERY Bilateral    cryoretinopexy    There were no vitals filed for this visit.   Subjective Assessment - 10/07/20 0817    Subjective HIp feeling great since injection. Shoulder has bothered me over the past 2 weeks. Hurt after shoveling and also after sleeping on it Sunday night.    Currently in Pain? No/denies    Pain Score 0              [facility] PT Assessment - 10/07/20 0001      AROM   Overall AROM Comments Beighton Scale Scoring = 5+ : Pinkie Rt 14 deg past 90; Lt 15 deg past 90; Thumb Rt 16 deg  past 90; Lt 30 deg past 90; Elbow hyperextension 14 deg Rt; 5 deg left; Knee hyperext 15 deg Rt; 10 deg left   patient reports she used to be able to bend her thumb back to forearm bil.                        [facility] Adult PT Treatment/Exercise - 10/07/20 0001      Neck Exercises: Theraband   Shoulder Extension Green;15 reps    Rows Green;15 reps    Shoulder External Rotation 10 reps;Green    Shoulder Internal Rotation 15 reps;Green    Shoulder Internal Rotation Limitations also did isometric walk outs x 10    Horizontal ABduction 10 reps;Green    Other Theraband Exercises diagonals green x 5 ea                  PT Education - 10/07/20 0845    Education Details HEP    Person(s) Educated Patient    Methods Explanation;Demonstration;Handout    Comprehension Verbalized understanding;Returned demonstration            PT Short Term Goals - 09/10/20  1120      PT SHORT TERM GOAL #1   Title be independent in initial HEP    Baseline --    Time 4    Period Weeks    Status New    Target Date 10/08/20      PT SHORT TERM GOAL #3   Title report a 30% reduction in Rt shoulder pain with wiping counter and lifting/carrying objects    Time 4    Period Weeks    Status New    Target Date 10/08/20      PT SHORT TERM GOAL #4   Title report a 30% reduction in Lt hip pain with walking, sit to stand and after sitting for work    Baseline --    Time 4    Period Weeks    Status New    Target Date 10/08/20      PT SHORT TERM GOAL #5   Title --             PT Long Term Goals - 09/10/20 1120      PT LONG TERM GOAL #1   Title ind with advanced HEP    Time 8    Period Weeks    Status New    Target Date 11/05/20      PT LONG TERM GOAL #2   Title FOTO > or = to 62    Baseline 41 at evaluation    Time 8    Period Weeks    Target Date 11/05/20      PT LONG TERM GOAL #3   Title walk dog and perform transitional motions with a 70% reduction in Lt hip pain     Baseline --    Time 8    Status New    Target Date 11/05/20      PT LONG TERM GOAL #4   Title report < or = to 2-3/10 Rt shoulder pain with cleaning, lifting and carrying    Baseline --    Time 8    Period Weeks    Status New    Target Date 11/05/20      PT LONG TERM GOAL #5   Title improve Lt hip strength to perform step down with good eccentric control and neutral hip/level pelvis to improve stability    Baseline --    Time 8    Period Weeks    Status New    Target Date 11/05/20                 Plan - 10/07/20 1322    Clinical Impression Statement Patient 15 min late. She reported significant relief from injection on Monday, but reports her shoulder has been bothering her more over the past 2 weeks. We added shoulder TE to her HEP to address this. She also requested that we assess her overall hypermobility as she is concerned that this is contributing to her overall joint pain and dysfunction. PT performed a modified Beighton Scoring system and patient scored > 5 indicating hypermobility.    Comorbidities lupus, hypermobility, Rt CTS release    Examination-Activity Limitations Carry;Bed Mobility;Lift;Stand;Stairs;Squat;Sleep    Examination-Participation Restrictions Community Activity;Laundry;Shop;Occupation    PT Frequency 1x / week    PT Duration 8 weeks    PT Treatment/Interventions ADLs/Self Care Home Management;Cryotherapy;Electrical Stimulation;Moist Heat;Stair training;Functional mobility training;Therapeutic activities;Therapeutic exercise;Balance training;Neuromuscular re-education;Manual techniques;Patient/family education;Passive range of motion;Dry needling;Joint Manipulations;Taping    PT Next Visit Plan Assess goals: Recheck Beighton score with correct  positioning ([email or web address]); assess response to new shoulder TE;    PT Home Exercise Plan Access Code: [email or web address]    Consulted and Agree with Plan of Care Patient            Patient will benefit from skilled therapeutic intervention in order to improve the following deficits and impairments:  Decreased activity tolerance,Decreased strength,Decreased balance,Pain,Increased muscle spasms,Decreased endurance,Decreased coordination  Visit Diagnosis: Chronic right shoulder pain  Pain in left hip  Muscle weakness (generalized)     Problem List Patient Active Problem List   Diagnosis Date Noted  . Seasonal and perennial allergic rhinitis 11/06/2018  . Mild intermittent asthma, uncomplicated 03/83/3383  . Flexural atopic dermatitis 11/06/2018  . History of hyperlipidemia 11/08/2016  . History of anxiety 11/08/2016  . History of retinal tear 11/08/2016  . History of Bilateral plantar fasciitis 08/10/2016  . High risk medication use 07/04/2016  . Lupus ([place]) 05/23/2016  . 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 ADHD (attention deficit hyperactivity disorder) 12/22/2014  . Raynaud's phenomenon 10/17/2013  . Hyperlipidemia 09/28/2011    Madelyn Flavors PT 10/07/2020, 1:36 PM  [facility] [address], [place], [place], [address] Phone: [phone]   Fax:  [phone]  Name: Robin [name] MRN: [id number] Date of Birth: 1971/08/13

## 2020-10-07 NOTE — Patient Instructions (Signed)
  Access Code: [email or web address] URL: [email or web address] Date: 10/07/2020 Prepared by: Almyra Free  Exercises Hip Flexor Stretch with Chair - 3 x daily - 7 x weekly - 1 sets - 5 reps - 10 hold Single Leg Stance - 2 x daily - 7 x weekly - 1 sets - 3 reps - 20 hold Forward Step Down Touch with Heel - 2 x daily - 7 x weekly - 1 sets - 10 reps Seated Scapular Retraction - 1 x daily - 7 x weekly - 3 sets - 10 reps Seated Scapular Retraction - 1 x daily - 7 x weekly - 3 sets - 10 reps Standing Isometric Hip Abduction with Ball on Wall - 1 x daily - 7 x weekly - 1 sets - 10 reps - 5 hold Shoulder External Rotation and Scapular Retraction with Resistance - 1 x daily - 7 x weekly - 3 sets - 10 reps Scapular Retraction with Resistance - 1 x daily - 7 x weekly - 3 sets - 10 reps Scapular Retraction with Resistance Advanced - 1 x daily - 7 x weekly - 3 sets - 10 reps Standing Shoulder Horizontal Abduction with Resistance - 1 x daily - 7 x weekly - 3 sets - 10 reps Standing Shoulder Single Arm PNF D2 Flexion with Resistance - 1 x daily - 7 x weekly - 3 sets - 10 reps Standing Shoulder Internal Rotation with Anchored Resistance - 1 x daily - 3 x weekly - 3 sets - 12 reps Shoulder Internal Rotation Reactive Isometrics - 1 x daily - 7 x weekly - 3 sets - 10 reps

## 2020-10-09 ENCOUNTER — Ambulatory Visit: Payer: BC Managed Care – PPO | Admitting: Family Medicine

## 2020-10-14 ENCOUNTER — Ambulatory Visit: Payer: BC Managed Care – PPO | Attending: Physician Assistant

## 2020-10-14 ENCOUNTER — Ambulatory Visit (INDEPENDENT_AMBULATORY_CARE_PROVIDER_SITE_OTHER): Payer: BC Managed Care – PPO

## 2020-10-14 ENCOUNTER — Other Ambulatory Visit: Payer: Self-pay | Admitting: Rheumatology

## 2020-10-14 ENCOUNTER — Other Ambulatory Visit: Payer: Self-pay

## 2020-10-14 DIAGNOSIS — M3219 Other organ or system involvement in systemic lupus erythematosus: Secondary | ICD-10-CM

## 2020-10-14 DIAGNOSIS — M6281 Muscle weakness (generalized): Secondary | ICD-10-CM | POA: Insufficient documentation

## 2020-10-14 DIAGNOSIS — M25552 Pain in left hip: Secondary | ICD-10-CM | POA: Diagnosis not present

## 2020-10-14 DIAGNOSIS — M25511 Pain in right shoulder: Secondary | ICD-10-CM | POA: Insufficient documentation

## 2020-10-14 DIAGNOSIS — G8929 Other chronic pain: Secondary | ICD-10-CM | POA: Insufficient documentation

## 2020-10-14 DIAGNOSIS — J309 Allergic rhinitis, unspecified: Secondary | ICD-10-CM | POA: Diagnosis not present

## 2020-10-14 NOTE — Telephone Encounter (Signed)
Last Visit: 09/01/2020 Next Visit: 01/26/2021  Labs: 09/01/2020, C3 is mildly decreased but stable. All other labs are within normal limits. Eye exam: 05/29/2020 WNL   Current Dose per office note 09/01/2020, Plaquenil 200 mg 1 tablet by mouth twice daily M-F. DX:  Other systemic lupus erythematosus with other organ involvement   Okay to refill Plaquenil?

## 2020-10-14 NOTE — Therapy (Signed)
[facility] [address] [place], [place], [address] Phone: [phone]   Fax:  [phone]  Physical Therapy Treatment  Patient Details  Name: Robin [name] MRN: [id number] Date of Birth: 1971-07-18 Referring Provider (PT): Hazel Sams, [facility]   Encounter Date: 10/14/2020   PT End of Session - 10/14/20 0846    Visit Number 6    Date for PT Re-Evaluation 11/05/20    Authorization Type BCBS    PT Start Time [id number]   pt late   PT Stop Time 0845    PT Time Calculation (min) 29 min    Activity Tolerance Patient tolerated treatment well    Behavior During Therapy [facility] for tasks assessed/performed           Past Medical History:  Diagnosis Date  . ADD (attention deficit disorder)   . Allergy   . Anemia   . Anxiety   . Asthma   . Depression   . Hyperlipidemia   . Ileitis   . Lupus ([place])   . PONV (postoperative nausea and vomiting)    mild nausea   . Raynaud disease     Past Surgical History:  Procedure Laterality Date  . CARPAL TUNNEL RELEASE Right    08/2019  . CESAREAN SECTION     x 2  . DENTAL SURGERY    . DILITATION & CURRETTAGE/HYSTROSCOPY WITH NOVASURE ABLATION N/A 06/03/2020   Procedure: DILATATION & CURETTAGE/HYSTEROSCOPY WITH NOVASURE ABLATION;  Surgeon: Aletha Halim, MD;  Location: [place];  Service: Gynecology;  Laterality: N/A;  . ENDOMETRIAL BIOPSY  04/20/2020      . EYE SURGERY Bilateral    cryoretinopexy    There were no vitals filed for this visit.   Subjective Assessment - 10/14/20 0815    Subjective I am feeling good.  I have not been doing exercises as much because my work week has been crazy.  I got an injection in my hip and it is feeling so much better.    Currently in Pain? Yes    Pain Score 1     Pain Location Shoulder    Pain Orientation Left;Right    Pain Descriptors / Indicators Burning;Sore    Pain Type Chronic pain    Pain Onset More than a month  ago    Pain Frequency Intermittent    Aggravating Factors  morning, use    Pain Relieving Factors medication, walking              [facility] PT Assessment - 10/14/20 0001      AROM   Overall AROM Comments Beighton Scale Score: 5th finger- 2 points (1 for each hand), Rt elbow hyperextension > 10- 1 point, flexion to floor without bending knees- 1 point.  Total 4                         [facility] Adult PT Treatment/Exercise - 10/14/20 0001      Neck Exercises: Theraband   Rows 20 reps;Green    Shoulder External Rotation Green;20 reps    Shoulder Internal Rotation Green;20 reps    Shoulder Internal Rotation Limitations standing in tandem to work hip stability    Horizontal ABduction Green;20 reps    Other Theraband Exercises diagonals green x 5 ea    Other Theraband Exercises triceps extension with green band x 10 Rt and Lt  PT Short Term Goals - 10/14/20 0845      PT SHORT TERM GOAL #1   Title be independent in initial HEP    Status Achieved      PT SHORT TERM GOAL #2   Title --      PT SHORT TERM GOAL #3   Title report a 30% reduction in Rt shoulder pain with wiping counter and lifting/carrying objects    Status Achieved      PT SHORT TERM GOAL #4   Title report a 30% reduction in Lt hip pain with walking, sit to stand and after sitting for work    Status Achieved             PT Long Term Goals - 09/10/20 1120      PT LONG TERM GOAL #1   Title ind with advanced HEP    Time 8    Period Weeks    Status New    Target Date 11/05/20      PT LONG TERM GOAL #2   Title FOTO > or = to 62    Baseline 41 at evaluation    Time 8    Period Weeks    Target Date 11/05/20      PT LONG TERM GOAL #3   Title walk dog and perform transitional motions with a 70% reduction in Lt hip pain    Baseline --    Time 8    Status New    Target Date 11/05/20      PT LONG TERM GOAL #4   Title report < or = to 2-3/10 Rt shoulder pain with  cleaning, lifting and carrying    Baseline --    Time 8    Period Weeks    Status New    Target Date 11/05/20      PT LONG TERM GOAL #5   Title improve Lt hip strength to perform step down with good eccentric control and neutral hip/level pelvis to improve stability    Baseline --    Time 8    Period Weeks    Status New    Target Date 11/05/20                 Plan - 10/14/20 0843    Clinical Impression Statement Pt arrived 15 minutes late today.  Beighton scale repeated as prior therapist didn't do correct positioning for all tests.  Score was 4/9 today.  PT reviewed all HEP issued last session for upper body/postural strength.  Pt required minor tactile cues and verbal education for alignment and muscular activation.  PT showed pt modifications to add hip stability to standing band exercises.  Pt denies any hip pain since injection 2 weeks ago.  Pt will continue to benefit from skilled PT to address postural/shoulder stability/endurance and hip/knee stability.    PT Frequency 1x / week    PT Duration 8 weeks    PT Treatment/Interventions ADLs/Self Care Home Management;Cryotherapy;Electrical Stimulation;Moist Heat;Stair training;Functional mobility training;Therapeutic activities;Therapeutic exercise;Balance training;Neuromuscular re-education;Manual techniques;Patient/family education;Passive range of motion;Dry needling;Joint Manipulations;Taping    PT Next Visit Plan Work on hip stability, continue postural/shoulder strength    PT Home Exercise Plan Access Code: [email or web address]    [email or web address] and Agree with Plan of Care Patient           Patient will benefit from skilled therapeutic intervention in order to improve the following deficits and impairments:  Decreased activity tolerance,Decreased strength,Decreased balance,Pain,Increased muscle spasms,Decreased endurance,Decreased coordination  Visit Diagnosis: Chronic right shoulder pain  Pain in left hip  Muscle weakness  (generalized)     Problem List Patient Active Problem List   Diagnosis Date Noted  . Seasonal and perennial allergic rhinitis 11/06/2018  . Mild intermittent asthma, uncomplicated 40/33/5331  . Flexural atopic dermatitis 11/06/2018  . History of hyperlipidemia 11/08/2016  . History of anxiety 11/08/2016  . History of retinal tear 11/08/2016  . History of Bilateral plantar fasciitis 08/10/2016  . High risk medication use 07/04/2016  . Lupus ([place]) 05/23/2016  . 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 ADHD (attention deficit hyperactivity disorder) 12/22/2014  . Raynaud's phenomenon 10/17/2013  . Hyperlipidemia 09/28/2011    Sigurd Sos, PT 10/14/20 8:48 AM  [facility] [address] [place], [place], [address] Phone: [phone]   Fax:  [phone]  Name: GITA DILGER MRN: [id number] Date of Birth: May 13, 1971

## 2020-10-19 DIAGNOSIS — F339 Major depressive disorder, recurrent, unspecified: Secondary | ICD-10-CM | POA: Diagnosis not present

## 2020-10-19 DIAGNOSIS — F411 Generalized anxiety disorder: Secondary | ICD-10-CM | POA: Diagnosis not present

## 2020-10-19 DIAGNOSIS — F902 Attention-deficit hyperactivity disorder, combined type: Secondary | ICD-10-CM | POA: Diagnosis not present

## 2020-10-21 ENCOUNTER — Other Ambulatory Visit: Payer: Self-pay

## 2020-10-21 ENCOUNTER — Ambulatory Visit: Payer: BC Managed Care – PPO

## 2020-10-21 DIAGNOSIS — M6281 Muscle weakness (generalized): Secondary | ICD-10-CM

## 2020-10-21 DIAGNOSIS — G8929 Other chronic pain: Secondary | ICD-10-CM | POA: Diagnosis not present

## 2020-10-21 DIAGNOSIS — M25511 Pain in right shoulder: Secondary | ICD-10-CM | POA: Diagnosis not present

## 2020-10-21 DIAGNOSIS — M25552 Pain in left hip: Secondary | ICD-10-CM | POA: Diagnosis not present

## 2020-10-21 NOTE — Therapy (Signed)
[facility] [address] [place], [place], [address] Phone: [phone]   Fax:  [phone]  Physical Therapy Treatment  Patient Details  Name: Robin [name] MRN: [id number] Date of Birth: 10-10-1970 Referring Provider (PT): Hazel Sams, [facility]   Encounter Date: 10/21/2020   PT End of Session - 10/21/20 0837    Visit Number 7    Date for PT Re-Evaluation 11/05/20    Authorization Type BCBS    PT Start Time 0802    PT Stop Time 0842    PT Time Calculation (min) 40 min    Activity Tolerance Patient tolerated treatment well    Behavior During Therapy [facility] for tasks assessed/performed           Past Medical History:  Diagnosis Date  . ADD (attention deficit disorder)   . Allergy   . Anemia   . Anxiety   . Asthma   . Depression   . Hyperlipidemia   . Ileitis   . Lupus ([place])   . PONV (postoperative nausea and vomiting)    mild nausea   . Raynaud disease     Past Surgical History:  Procedure Laterality Date  . CARPAL TUNNEL RELEASE Right    08/2019  . CESAREAN SECTION     x 2  . DENTAL SURGERY    . DILITATION & CURRETTAGE/HYSTROSCOPY WITH NOVASURE ABLATION N/A 06/03/2020   Procedure: DILATATION & CURETTAGE/HYSTEROSCOPY WITH NOVASURE ABLATION;  Surgeon: Aletha Halim, MD;  Location: [place];  Service: Gynecology;  Laterality: N/A;  . ENDOMETRIAL BIOPSY  04/20/2020      . EYE SURGERY Bilateral    cryoretinopexy    There were no vitals filed for this visit.   Subjective Assessment - 10/21/20 0803    Subjective I have had a small catch in the Lt hip and some shoulder pain.    Currently in Pain? Yes    Pain Score 6     Pain Location Shoulder    Pain Orientation Right    Pain Descriptors / Indicators Burning;Sore    Pain Type Chronic pain    Pain Onset More than a month ago    Pain Frequency Constant    Aggravating Factors  using for repetative tasks    Pain Relieving Factors  medication, scapular retraction    Pain Score 0    Pain Location Hip    Pain Descriptors / Indicators Sore                             [facility] Adult PT Treatment/Exercise - 10/21/20 0001      Exercises   Exercises Shoulder      Knee/Hip Exercises: Aerobic   Nustep Level 4x 8 minutes- arms and legs      Knee/Hip Exercises: Standing   Forward Step Up Left;2 sets;10 reps;Hand Hold: 1    Forward Step Up Limitations on Bosu ball    Step Down Both;20 reps;Hand Hold: 0;Step Height: 4"    Step Down Limitations improved Lt hip alignment with this    SLS forward T x 10    Walking with Sports Cord 25# 4 ways x 10 each      Shoulder Exercises: ROM/Strengthening   UBE (Upper Arm Bike) Level 2 x 6 minutes (3/3)      Shoulder Exercises: Power Development worker, community 20 reps    Row 20 reps    Row Limitations  25#                    PT Short Term Goals - 10/14/20 0845      PT SHORT TERM GOAL #1   Title be independent in initial HEP    Status Achieved      PT SHORT TERM GOAL #2   Title --      PT SHORT TERM GOAL #3   Title report a 30% reduction in Rt shoulder pain with wiping counter and lifting/carrying objects    Status Achieved      PT SHORT TERM GOAL #4   Title report a 30% reduction in Lt hip pain with walking, sit to stand and after sitting for work    Status Achieved             PT Long Term Goals - 10/21/20 0801      PT LONG TERM GOAL #1   Title ind with advanced HEP    Time 8    Period Weeks    Status On-going      PT LONG TERM GOAL #3   Title walk dog and perform transitional motions with a 70% reduction in Lt hip pain    Status Achieved      PT LONG TERM GOAL #4   Title report < or = to 2-3/10 Rt shoulder pain with cleaning, lifting and carrying    Baseline up to 6/10    Status On-going      PT LONG TERM GOAL #5   Title improve Lt hip strength to perform step down with good eccentric control and neutral hip/level pelvis to improve  stability    Time 8    Status On-going                 Plan - 10/21/20 5638    Clinical Impression Statement Pt continues to report no hip pain since the injection.  Pt with increased Rt shoulder pain with daily tasks this week.   Pt with functional Lt hip weakness and asymmetries with single limb activity in the clinic.  PT provided tactile and demo cues for alignment.  Pt will continue to benefit from skilled PT to address postural/shoulder stability/endurance and hip/knee stability.    PT Frequency 1x / week    PT Duration 8 weeks    PT Treatment/Interventions ADLs/Self Care Home Management;Cryotherapy;Electrical Stimulation;Moist Heat;Stair training;Functional mobility training;Therapeutic activities;Therapeutic exercise;Balance training;Neuromuscular re-education;Manual techniques;Patient/family education;Passive range of motion;Dry needling;Joint Manipulations;Taping    PT Next Visit Plan Work on hip stability, continue postural/shoulder strength    PT Home Exercise Plan Access Code: [email or web address]    [email or web address] and Agree with Plan of Care Patient           Patient will benefit from skilled therapeutic intervention in order to improve the following deficits and impairments:  Decreased activity tolerance,Decreased strength,Decreased balance,Pain,Increased muscle spasms,Decreased endurance,Decreased coordination  Visit Diagnosis: Chronic right shoulder pain  Pain in left hip  Muscle weakness (generalized)     Problem List Patient Active Problem List   Diagnosis Date Noted  . Seasonal and perennial allergic rhinitis 11/06/2018  . Mild intermittent asthma, uncomplicated 03/55/9741  . Flexural atopic dermatitis 11/06/2018  . History of hyperlipidemia 11/08/2016  . History of anxiety 11/08/2016  . History of retinal tear 11/08/2016  . History of Bilateral plantar fasciitis 08/10/2016  . High risk medication use 07/04/2016  . Lupus ([place]) 05/23/2016  . 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 ADHD (attention  deficit hyperactivity disorder) 12/22/2014  . Raynaud's phenomenon 10/17/2013  . Hyperlipidemia 09/28/2011    Sigurd Sos, PT 10/21/20 8:38 AM  [facility] [address] [place], [place], [address] Phone: [phone]   Fax:  [phone]  Name: EMYAH ROZNOWSKI MRN: [id number] Date of Birth: 10/21/1970

## 2020-10-23 ENCOUNTER — Ambulatory Visit (INDEPENDENT_AMBULATORY_CARE_PROVIDER_SITE_OTHER): Payer: BC Managed Care – PPO | Admitting: *Deleted

## 2020-10-23 DIAGNOSIS — J309 Allergic rhinitis, unspecified: Secondary | ICD-10-CM | POA: Diagnosis not present

## 2020-10-27 DIAGNOSIS — F411 Generalized anxiety disorder: Secondary | ICD-10-CM | POA: Diagnosis not present

## 2020-10-27 DIAGNOSIS — F902 Attention-deficit hyperactivity disorder, combined type: Secondary | ICD-10-CM | POA: Diagnosis not present

## 2020-10-27 DIAGNOSIS — F339 Major depressive disorder, recurrent, unspecified: Secondary | ICD-10-CM | POA: Diagnosis not present

## 2020-10-28 ENCOUNTER — Ambulatory Visit: Payer: BC Managed Care – PPO

## 2020-10-29 ENCOUNTER — Ambulatory Visit (INDEPENDENT_AMBULATORY_CARE_PROVIDER_SITE_OTHER): Payer: BC Managed Care – PPO | Admitting: *Deleted

## 2020-10-29 DIAGNOSIS — J309 Allergic rhinitis, unspecified: Secondary | ICD-10-CM | POA: Diagnosis not present

## 2020-11-04 ENCOUNTER — Other Ambulatory Visit: Payer: Self-pay

## 2020-11-04 ENCOUNTER — Ambulatory Visit: Payer: BC Managed Care – PPO

## 2020-11-04 DIAGNOSIS — M6281 Muscle weakness (generalized): Secondary | ICD-10-CM | POA: Diagnosis not present

## 2020-11-04 DIAGNOSIS — M25511 Pain in right shoulder: Secondary | ICD-10-CM

## 2020-11-04 DIAGNOSIS — G8929 Other chronic pain: Secondary | ICD-10-CM | POA: Diagnosis not present

## 2020-11-04 DIAGNOSIS — M25552 Pain in left hip: Secondary | ICD-10-CM

## 2020-11-04 NOTE — Therapy (Signed)
[facility] [address], [address] [place], [place], [address] Phone: [phone]   Fax:  [phone]  Physical Therapy Treatment  Patient Details  Name: Robin [name] MRN: [id number] Date of Birth: 12/26/1970 Referring Provider (PT): Dale, Taylor, PAC   Encounter Date: 11/04/2020   PT End of Session - 11/04/20 0847    Visit Number 8    PT Start Time 0810    PT Stop Time 0842    PT Time Calculation (min) 32 min    Activity Tolerance Patient tolerated treatment well    Behavior During Therapy WFL for tasks assessed/performed           Past Medical History:  Diagnosis Date  . ADD (attention deficit disorder)   . Allergy   . Anemia   . Anxiety   . Asthma   . Depression   . Hyperlipidemia   . Ileitis   . Lupus (HCC)   . PONV (postoperative nausea and vomiting)    mild nausea   . Raynaud disease     Past Surgical History:  Procedure Laterality Date  . CARPAL TUNNEL RELEASE Right    08/2019  . CESAREAN SECTION     x 2  . DENTAL SURGERY    . DILITATION & CURRETTAGE/HYSTROSCOPY WITH NOVASURE ABLATION N/A 06/03/2020   Procedure: DILATATION & CURETTAGE/HYSTEROSCOPY WITH NOVASURE ABLATION;  Surgeon: Pickens, Charlie, MD;  Location: [facility];  Service: Gynecology;  Laterality: N/A;  . ENDOMETRIAL BIOPSY  04/20/2020      . EYE SURGERY Bilateral    cryoretinopexy    There were no vitals filed for this visit.   Subjective Assessment - 11/04/20 0815    Subjective My hip is bothering me a little be more now.  Lt hip is 60% better.  Shoulder is 40% better.    Diagnostic tests x-ray: hip and shoulder- "joint looks good" per pt report    Patient Stated Goals reduce Rt shoulder pain and Lt hip pain    Currently in Pain? Yes    Pain Score 0-No pain    Pain Score 4    Pain Location Hip    Pain Orientation Left    Pain Descriptors / Indicators Sore    Pain Onset More than a month ago    Pain Frequency  Intermittent    Aggravating Factors  walking, getting in/out of the car, morning hours    Pain Relieving Factors after stretching, as day progresses              [facility] PT Assessment - 11/04/20 0001      Assessment   Medical Diagnosis chronic Rt shoulder pain, pain in Lt hip    Referring Provider (PT) Dale, Taylor, PAC    Onset Date/Surgical Date 08/13/19    Hand Dominance Right                         [facility] Adult PT Treatment/Exercise - 11/04/20 0001      Neck Exercises: Machines for Strengthening   UBE (Upper Arm Bike) Level 2x 8 minutes- PT present to discuss progress            Trigger Point Dry Needling - 11/04/20 0001    Consent Given? Yes    Education Handout Provided Previously provided    Muscles Treated Upper Quadrant Deltoid   Rt only   Muscles Treated Back/Hip Gluteus minimus;Gluteus medius;Piriformis   Lt only     Deltoid Response Twitch response elicited;Palpable increased muscle length    Gluteus Minimus Response Twitch response elicited;Palpable increased muscle length    Gluteus Medius Response Twitch response elicited;Palpable increased muscle length    Gluteus Maximus Response Twitch response elicited;Palpable increased muscle length    Piriformis Response Twitch response elicited;Palpable increased muscle length                  PT Short Term Goals - 10/14/20 0845      PT SHORT TERM GOAL #1   Title be independent in initial HEP    Status Achieved      PT SHORT TERM GOAL #2   Title --      PT SHORT TERM GOAL #3   Title report a 30% reduction in Rt shoulder pain with wiping counter and lifting/carrying objects    Status Achieved      PT SHORT TERM GOAL #4   Title report a 30% reduction in Lt hip pain with walking, sit to stand and after sitting for work    Status Achieved             PT Long Term Goals - 11/04/20 2703      PT LONG TERM GOAL #1   Title ind with advanced HEP    Status Achieved      PT LONG TERM  GOAL #2   Title FOTO > or = to 62    Baseline 45    Status Partially Met      PT LONG TERM GOAL #3   Title walk dog and perform transitional motions with a 70% reduction in Lt hip pain    Baseline 60%    Status Partially Met      PT LONG TERM GOAL #4   Title report < or = to 2-3/10 Rt shoulder pain with cleaning, lifting and carrying    Baseline up to 6/10    Status Not Met      PT LONG TERM GOAL #5   Title improve Lt hip strength to perform step down with good eccentric control and neutral hip/level pelvis to improve stability    Status Achieved                 Plan - 11/04/20 0846    Clinical Impression Statement Pt is ready to D/C to HEP today.  Pt reports 60% improvement in hip and 40% improvement in shoulder.  Pt is independent and compliant in HEP for stability and flexibility.  FOTO is improved to 45 although goal not met.  PT provided verbal review of HEP and session focused on dry needling for Rt shoulder and Lt gluteals.  Pt with multiple trigger points and tension in both regions and demonstrated improved mobility after manual therapy today.  Pt will D/C to HEP today.    PT Next Visit Plan D/C PT to HEP today    PT Home Exercise Plan Access Code: [email or web address]    Consulted and Agree with Plan of Care Patient           Patient will benefit from skilled therapeutic intervention in order to improve the following deficits and impairments:  Decreased activity tolerance,Decreased strength,Decreased balance,Pain,Increased muscle spasms,Decreased endurance,Decreased coordination  Visit Diagnosis: Pain in left hip  Chronic right shoulder pain  Muscle weakness (generalized)     Problem List Patient Active Problem List   Diagnosis Date Noted  . Seasonal and perennial allergic rhinitis 11/06/2018  . Mild intermittent asthma, uncomplicated 29/93/7169  .  Flexural atopic dermatitis 11/06/2018  . History of hyperlipidemia 11/08/2016  . History of anxiety 11/08/2016  .  History of retinal tear 11/08/2016  . History of Bilateral plantar fasciitis 08/10/2016  . High risk medication use 07/04/2016  . Lupus (HCC) 05/23/2016  . 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HCC) 03/18/2016  . ADHD (attention deficit hyperactivity disorder) 12/22/2014  . Raynaud's phenomenon 10/17/2013  . Hyperlipidemia 09/28/2011   PHYSICAL THERAPY DISCHARGE SUMMARY  Visits from Start of Care: 8  Current functional level related to goals / functional outcomes: See above for current status.     Remaining deficits: Rt shoulder and Lt gluteal pain.  Pt has HEP in place to address remaining deficits.     Education / Equipment: HEP Plan: Patient agrees to discharge.  Patient goals were partially met. Patient is being discharged due to being pleased with the current functional level.  ?????         , PT 11/04/20 8:51 AM  [facility] [address], [address] [place], [place], [address] Phone: [phone]   Fax:  [phone]  Name: Robin [name] MRN: [id number] Date of Birth: 08/15/1971   

## 2020-11-09 DIAGNOSIS — F902 Attention-deficit hyperactivity disorder, combined type: Secondary | ICD-10-CM | POA: Diagnosis not present

## 2020-11-09 DIAGNOSIS — F411 Generalized anxiety disorder: Secondary | ICD-10-CM | POA: Diagnosis not present

## 2020-11-09 DIAGNOSIS — F339 Major depressive disorder, recurrent, unspecified: Secondary | ICD-10-CM | POA: Diagnosis not present

## 2020-11-10 DIAGNOSIS — J3089 Other allergic rhinitis: Secondary | ICD-10-CM | POA: Diagnosis not present

## 2020-11-10 DIAGNOSIS — J301 Allergic rhinitis due to pollen: Secondary | ICD-10-CM | POA: Diagnosis not present

## 2020-11-10 NOTE — Progress Notes (Signed)
VIALS EXP 11-10-21

## 2020-11-18 ENCOUNTER — Ambulatory Visit (INDEPENDENT_AMBULATORY_CARE_PROVIDER_SITE_OTHER): Payer: BC Managed Care – PPO

## 2020-11-18 DIAGNOSIS — J309 Allergic rhinitis, unspecified: Secondary | ICD-10-CM

## 2020-11-24 DIAGNOSIS — F339 Major depressive disorder, recurrent, unspecified: Secondary | ICD-10-CM | POA: Diagnosis not present

## 2020-11-24 DIAGNOSIS — F411 Generalized anxiety disorder: Secondary | ICD-10-CM | POA: Diagnosis not present

## 2020-11-24 DIAGNOSIS — F902 Attention-deficit hyperactivity disorder, combined type: Secondary | ICD-10-CM | POA: Diagnosis not present

## 2020-11-25 ENCOUNTER — Encounter: Payer: Self-pay | Admitting: Rheumatology

## 2020-11-25 DIAGNOSIS — M249 Joint derangement, unspecified: Secondary | ICD-10-CM

## 2020-11-25 NOTE — Telephone Encounter (Signed)
Is advise patient that I do not do evaluation for hypermobility.  She should schedule an appointment with Dr. Tamala Julian (sports medicine) at [facility].

## 2020-11-27 ENCOUNTER — Other Ambulatory Visit: Payer: Self-pay

## 2020-11-27 ENCOUNTER — Ambulatory Visit (INDEPENDENT_AMBULATORY_CARE_PROVIDER_SITE_OTHER): Payer: BC Managed Care – PPO | Admitting: Family Medicine

## 2020-11-27 VITALS — BP 118/70 | HR 98 | Ht 66.0 in | Wt 150.0 lb

## 2020-11-27 DIAGNOSIS — M357 Hypermobility syndrome: Secondary | ICD-10-CM | POA: Insufficient documentation

## 2020-11-27 NOTE — Patient Instructions (Addendum)
Good to see you  Please see [email or web address]   Please try the rayos as close to 10 pm  Please send me a message in MyChart with any questions or updates.  Please see me back to have the orthotics made.   --Dr. Raeford Razor

## 2020-11-27 NOTE — Assessment & Plan Note (Signed)
Beighton score 9/9.  Has had retinal changes and sees an ophthalmologist on a yearly basis.  Does have some widening of the forefoot which could lead to some of her lower extremity pain as well. -Counseled on home exercise therapy and supportive care. -Referral to physical therapy. -Could consider custom orthotics. -Could consider Elavil. -Provided Rayos samples.

## 2020-11-27 NOTE — Progress Notes (Signed)
Robin [name] - 50 y.o. female MRN [id number]  Date of birth: 02-14-1971 Robin [name], am serving as a scribe for Dr. Clearance Coots MD. SUBJECTIVE:  Including CC & ROS.  Chief Complaint  Patient presents with  . New Patient (Initial Visit)    Patient states that she has been getting soft tissue injuries and is not doing any sort of high intensity exercises to cause them. She does PT for hip and shoulder and where she was diagnosed with a tear in her hip cartilage. Patient is also having spasms and soft tissue pain. Patient was told in the past that she has hypermobility and wanted to see someone to see if she has a connective tissue issue going on.     Robin [name] is a 50 y.o. female that is presenting with concern of pain in multiple areas.  She has been through physical therapy and received injection of different joints.  She does have a history of lupus.  Has concern for hypermobility.   Review of Systems See HPI   HISTORY: Past Medical, Surgical, Social, and Family History Reviewed & Updated per EMR.   Pertinent Historical Findings include:  Past Medical History:  Diagnosis Date  . ADD (attention deficit disorder)   . Allergy   . Anemia   . Anxiety   . Asthma   . Depression   . Hyperlipidemia   . Ileitis   . Lupus ([place])   . PONV (postoperative nausea and vomiting)    mild nausea   . Raynaud disease     Past Surgical History:  Procedure Laterality Date  . CARPAL TUNNEL RELEASE Right    08/2019  . CESAREAN SECTION     x 2  . DENTAL SURGERY    . DILITATION & CURRETTAGE/HYSTROSCOPY WITH NOVASURE ABLATION N/A 06/03/2020   Procedure: DILATATION & CURETTAGE/HYSTEROSCOPY WITH NOVASURE ABLATION;  Surgeon: Aletha Halim, MD;  Location: [facility];  Service: Gynecology;  Laterality: N/A;  . ENDOMETRIAL BIOPSY  04/20/2020      . EYE SURGERY Bilateral    cryoretinopexy    Family History  Problem Relation Age of Onset  . Heart disease Mother 55        CAD/stenting  . Hyperlipidemia Mother   . Arthritis Mother        ostearthritis  . Hypertension Mother   . Kidney disease Mother   . Irritable bowel syndrome Mother   . Stroke Mother 4       CVA  . Fibromyalgia Mother   . Eczema Mother   . Hyperlipidemia Father   . Neuropathy Father   . Allergies Father        shellfish  . Hyperlipidemia Sister   . Hypertension Brother   . Colon cancer Neg Hx   . Inflammatory bowel disease Neg Hx   . Allergic rhinitis Neg Hx   . Angioedema Neg Hx   . Atopy Neg Hx   . Asthma Neg Hx   . Immunodeficiency Neg Hx   . Urticaria Neg Hx     Social History   Socioeconomic History  . Marital status: Married    Spouse name: Not on file  . Number of children: 2  . Years of education: 6  . Highest education level: Not on file  Occupational History  . Not on file  Tobacco Use  . Smoking status: Former Smoker    Packs/day: 0.75    Years: 10.00    Pack years: 7.50  Types: Cigarettes    Quit date: 07/05/2001    Years since quitting: 19.4  . Smokeless tobacco: Never Used  Vaping Use  . Vaping Use: Never used  Substance and Sexual Activity  . Alcohol use: Yes    Alcohol/week: 14.0 standard drinks    Types: 14 Cans of beer per week    Comment: 2 beer daily  . Drug use: Never  . Sexual activity: Yes    Birth control/protection: Condom  Other Topics Concern  . Not on file  Social History Narrative   Marital status: married x 17 years; happily married      Children: 2 children (10, 34)      Lives: with husband, 2 children.      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 Worried About Charity fundraiser in the Last Year: Never true  . Ran Out of Food in the  Last Year: Never true  Transportation Needs: No Transportation Needs  . Lack of Transportation (Medical): No  . Lack of Transportation (Non-Medical): No  Physical Activity: Not on file  Stress: Not on file  Social Connections: Not on file  Intimate Partner Violence: Not on file     PHYSICAL EXAM:  VS: BP 118/70 (BP Location: Left Arm, Patient Position: Sitting, Cuff Size: Normal)   Pulse 98   Ht 5' 6"  (1.676 m)   Wt 150 lb (68 kg)   SpO2 99%   BMI 24.21 kg/m  Physical Exam Gen: NAD, alert, cooperative with exam, well-appearing MSK:  Able to take each thumb to forearm. Can place pinky last 90 degrees. Hyperextension of the elbows. Hyperextension of the knees. Complains hands flat on floor. Neurovascular intact   ASSESSMENT & PLAN:   I spent 45 minutes with this patient, greater than 50% was face-to-face time counseling regarding the below diagnosis.   Hypermobility syndrome Beighton score 9/9.  Has had retinal changes and sees an ophthalmologist on a yearly basis.  Does have some widening of the forefoot which could lead to some of her lower extremity pain as well. -Counseled on home exercise therapy and supportive care. -Referral to physical therapy. -Could consider custom orthotics. -Could consider Elavil. -Provided Rayos samples.

## 2020-12-03 ENCOUNTER — Ambulatory Visit: Payer: BC Managed Care – PPO | Admitting: Orthopaedic Surgery

## 2020-12-04 ENCOUNTER — Ambulatory Visit (INDEPENDENT_AMBULATORY_CARE_PROVIDER_SITE_OTHER): Payer: BC Managed Care – PPO | Admitting: *Deleted

## 2020-12-04 DIAGNOSIS — J309 Allergic rhinitis, unspecified: Secondary | ICD-10-CM

## 2020-12-08 ENCOUNTER — Ambulatory Visit (INDEPENDENT_AMBULATORY_CARE_PROVIDER_SITE_OTHER): Payer: BC Managed Care – PPO | Admitting: Orthopaedic Surgery

## 2020-12-08 ENCOUNTER — Encounter: Payer: Self-pay | Admitting: Orthopaedic Surgery

## 2020-12-08 DIAGNOSIS — M25552 Pain in left hip: Secondary | ICD-10-CM | POA: Diagnosis not present

## 2020-12-08 DIAGNOSIS — M357 Hypermobility syndrome: Secondary | ICD-10-CM | POA: Diagnosis not present

## 2020-12-08 DIAGNOSIS — M25511 Pain in right shoulder: Secondary | ICD-10-CM

## 2020-12-08 NOTE — Progress Notes (Signed)
Office Visit Note   Patient: Robin [name]           Date of Birth: Aug 03, 1971           MRN: [id number] Visit Date: 12/08/2020              Requested by: Daleen Squibb, MD [address] [place],  [place] [address] PCP: Jacelyn Pi, Lilia Argue, MD   Assessment & Plan: Visit Diagnoses:  1. Hypermobility syndrome     Plan: From my standpoint she is doing well and has gotten into see the appropriate doctors for medical management.  She will continue seeing Dr. Estanislado Pandy and Dr. Raeford Razor.  Follow-up with [place] as needed.  Follow-Up Instructions: Return if symptoms worsen or fail to improve.   Orders:  No orders of the defined types were placed in this encounter.  No orders of the defined types were placed in this encounter.     Procedures: No procedures performed   Clinical Data: No additional findings.   Subjective: Chief Complaint  Patient presents with  . Right Shoulder - Pain, Follow-up  . Left Hip - Pain, Follow-up    Azani returns today for right shoulder and left hip follow-up.  She recently saw Dr.Smits for hypermobility syndrome.  She has been placed on medications for this.  Overall she is feeling good and does not need anything today.   Review of Systems   Objective: Vital Signs: There were no vitals taken for this visit.  Physical Exam  Ortho Exam Exams are unchanged. Right shoulder exam shows full range of motion with good strength to manual muscle testing. Specialty Comments:  No specialty comments available.  Imaging: No results found.   PMFS History: Patient Active Problem List   Diagnosis Date Noted  . Hypermobility syndrome 11/27/2020  . Seasonal and perennial allergic rhinitis 11/06/2018  . Mild intermittent asthma, uncomplicated 66/59/9357  . Flexural atopic dermatitis 11/06/2018  . History of hyperlipidemia 11/08/2016  . History of anxiety 11/08/2016  . History of retinal tear 11/08/2016  . History of  Bilateral plantar fasciitis 08/10/2016  . High risk medication use 07/04/2016  . Lupus ([place]) 05/23/2016  . 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[place]) 03/18/2016  . ADHD (attention deficit hyperactivity disorder) 12/22/2014  . Raynaud's phenomenon 10/17/2013  . Hyperlipidemia 09/28/2011   Past Medical History:  Diagnosis Date  . ADD (attention deficit disorder)   . Allergy   . Anemia   . Anxiety   . Asthma   . Depression   . Hyperlipidemia   . Ileitis   . Lupus ([place])   . PONV (postoperative nausea and vomiting)    mild nausea   . Raynaud disease     Family History  Problem Relation Age of Onset  . Heart disease Mother 76       CAD/stenting  . Hyperlipidemia Mother   . Arthritis Mother        ostearthritis  . Hypertension Mother   . Kidney disease Mother   . Irritable bowel syndrome Mother   . Stroke Mother 25       CVA  . Fibromyalgia Mother   . Eczema Mother   . Hyperlipidemia Father   . Neuropathy Father   . Allergies Father        shellfish  . Hyperlipidemia Sister   . Hypertension Brother   . Colon cancer Neg Hx   . Inflammatory bowel disease Neg Hx   . Allergic rhinitis Neg Hx   .  Angioedema Neg Hx   . Atopy Neg Hx   . Asthma Neg Hx   . Immunodeficiency Neg Hx   . Urticaria Neg Hx     Past Surgical History:  Procedure Laterality Date  . CARPAL TUNNEL RELEASE Right    08/2019  . CESAREAN SECTION     x 2  . DENTAL SURGERY    . DILITATION & CURRETTAGE/HYSTROSCOPY WITH NOVASURE ABLATION N/A 06/03/2020   Procedure: DILATATION & CURETTAGE/HYSTEROSCOPY WITH NOVASURE ABLATION;  Surgeon: Aletha Halim, MD;  Location: [facility];  Service: Gynecology;  Laterality: N/A;  . ENDOMETRIAL BIOPSY  04/20/2020      . EYE SURGERY Bilateral    cryoretinopexy   Social History   Occupational History  . Not on file  Tobacco Use  . Smoking status: Former Smoker    Packs/day: 0.75    Years: 10.00    Pack years: 7.50    Types: Cigarettes    Quit date: 07/05/2001     Years since quitting: 19.4  . Smokeless tobacco: Never Used  Vaping Use  . Vaping Use: Never used  Substance and Sexual Activity  . Alcohol use: Yes    Alcohol/week: 14.0 standard drinks    Types: 14 Cans of beer per week    Comment: 2 beer daily  . Drug use: Never  . Sexual activity: Yes    Birth control/protection: Condom

## 2020-12-11 ENCOUNTER — Encounter: Payer: Self-pay | Admitting: Family Medicine

## 2020-12-11 DIAGNOSIS — F419 Anxiety disorder, unspecified: Secondary | ICD-10-CM | POA: Diagnosis not present

## 2020-12-11 DIAGNOSIS — R41840 Attention and concentration deficit: Secondary | ICD-10-CM | POA: Diagnosis not present

## 2020-12-11 DIAGNOSIS — F338 Other recurrent depressive disorders: Secondary | ICD-10-CM | POA: Diagnosis not present

## 2020-12-17 ENCOUNTER — Ambulatory Visit (INDEPENDENT_AMBULATORY_CARE_PROVIDER_SITE_OTHER): Payer: BC Managed Care – PPO | Admitting: *Deleted

## 2020-12-17 DIAGNOSIS — J309 Allergic rhinitis, unspecified: Secondary | ICD-10-CM | POA: Diagnosis not present

## 2020-12-23 ENCOUNTER — Other Ambulatory Visit: Payer: Self-pay

## 2020-12-23 ENCOUNTER — Encounter: Payer: Self-pay | Admitting: Physical Therapy

## 2020-12-23 ENCOUNTER — Other Ambulatory Visit: Payer: Self-pay | Admitting: Family Medicine

## 2020-12-23 ENCOUNTER — Ambulatory Visit: Payer: BC Managed Care – PPO | Attending: Family Medicine | Admitting: Physical Therapy

## 2020-12-23 DIAGNOSIS — M2550 Pain in unspecified joint: Secondary | ICD-10-CM | POA: Diagnosis not present

## 2020-12-23 DIAGNOSIS — M25552 Pain in left hip: Secondary | ICD-10-CM | POA: Diagnosis not present

## 2020-12-23 DIAGNOSIS — G8929 Other chronic pain: Secondary | ICD-10-CM | POA: Insufficient documentation

## 2020-12-23 DIAGNOSIS — M357 Hypermobility syndrome: Secondary | ICD-10-CM | POA: Diagnosis not present

## 2020-12-23 DIAGNOSIS — M6281 Muscle weakness (generalized): Secondary | ICD-10-CM | POA: Diagnosis not present

## 2020-12-23 DIAGNOSIS — M25511 Pain in right shoulder: Secondary | ICD-10-CM | POA: Diagnosis not present

## 2020-12-23 MED ORDER — RAYOS 5 MG PO TBEC: DELAYED_RELEASE_TABLET | ORAL | 1 refills | Status: DC

## 2020-12-23 NOTE — Therapy (Signed)
[place], [place], [address] Phone: [phone]   Fax:  [phone]  Physical Therapy Evaluation  Patient Details  Name: Robin [name] MRN: [id number] Date of Birth: 08-16-1971 Referring Provider (PT): Dr. Clearance Coots   Encounter Date: 12/23/2020   PT End of Session - 12/23/20 0848    Visit Number 1    Number of Visits 8    Authorization Type BCBS    PT Start Time 5809    PT Stop Time 0915    PT Time Calculation (min) 43 min           Past Medical History:  Diagnosis Date  . ADD (attention deficit disorder)   . Allergy   . Anemia   . Anxiety   . Asthma   . Depression   . Hyperlipidemia   . Ileitis   . Lupus ([place])   . PONV (postoperative nausea and vomiting)    mild nausea   . Raynaud disease     Past Surgical History:  Procedure Laterality Date  . CARPAL TUNNEL RELEASE Right    08/2019  . CESAREAN SECTION     x 2  . DENTAL SURGERY    . DILITATION & CURRETTAGE/HYSTROSCOPY WITH NOVASURE ABLATION N/A 06/03/2020   Procedure: DILATATION & CURETTAGE/HYSTEROSCOPY WITH NOVASURE ABLATION;  Surgeon: Aletha Halim, MD;  Location: [facility];  Service: Gynecology;  Laterality: N/A;  . ENDOMETRIAL BIOPSY  04/20/2020      . EYE SURGERY Bilateral    cryoretinopexy    There were no vitals filed for this visit.    Subjective Assessment - 12/23/20 0839    Subjective Pt presents to PT with suspected hypermobility syndrome with associated multi- joint pain, swelling and  instability.  She has done PT this past year for her hip and shoulder and did have some improvement with pain and function.  She would like to learn how to know when she should push the limits of her pain.  She feels like she loses conditioning quickly.  She stays active as best she can, walking daily.  When she did her exercises she often made it worse. "How can I rebuild my core stability"    Pertinent History  Lupus, Rt carpal tunnel release, Gastric lesion, detached retina, dizziness    Limitations Sitting;Lifting;Walking;Standing;Other (comment);House hold activities   stairs   How long can you walk comfortably? right now she is walking about 3-5 miles, 3-4 days per week (has foot pain and low back, hip pain )    Diagnostic tests none since 12/21    Patient Stated Goals get a routine    Currently in Pain? Yes    Pain Score 4    5/10- balls of feet   Pain Location Generalized    Pain Orientation Other (Comment)    Pain Descriptors / Indicators Aching;Sore    Pain Type Chronic pain    Pain Onset More than a month ago    Pain Frequency Constant    Aggravating Factors  excessive activity    Pain Relieving Factors meds, rest, hot shower    Multiple Pain Sites No              [facility] PT Assessment - 12/23/20 0001      Assessment   Referring Provider (PT) Dr. Clearance Coots    Onset Date/Surgical Date --   chronic     Precautions   Precautions None      Restrictions  Weight Bearing Restrictions No      Balance Screen   Has the patient fallen in the past 6 months Yes    How many times? 2   this year   Has the patient had a decrease in activity level because of a fear of falling?  Yes    Is the patient reluctant to leave their home because of a fear of falling?  No      Home Environment   Living Environment Private residence    Living Arrangements Spouse/significant other    Type of [place] to enter      Prior Function   Level of Independence Independent    Vocation Full time employment    Vocation Requirements desk work    Leisure walks Community education officer   Overall Cognitive Status Within Functional Limits for tasks assessed      Observation/Other Assessments   Focus on Therapeutic Outcomes (FOTO)  40% able   51% is the goal     Sensation   Light Touch Appears Intact      Coordination   Gross Motor Movements are Fluid and Coordinated Not  tested      AROM   Overall AROM Comments WNL throughout UEs and ankle    Lumbar Flexion palms flat    Lumbar Extension WFL    Lumbar - Right Side Bend WFL    Lumbar - Left Side Bend WFL    Lumbar - Right Rotation WFL    Lumbar - Left Rotation WFL      PROM   Overall PROM Comments hip IR WNL with greater ROM on LLE      Strength   Overall Strength Comments WFL in knees, ankle DF, did not test hip abd due to time constraints      Palpation   Spinal mobility NT    Palpation comment sore in lateral hips and lumbar , SI region and L ant shoulder      Special Tests   Other special tests Beighton scale 7/9 with no point for thumbs           smooth velvety skin     Objective measurements completed on examination: See above findings.        PT Education - 12/23/20 1111    Education Details PT/POC, HEP emailed.  Pilates and stability    Person(s) Educated Patient    Methods Explanation;Tactile cues;Demonstration;Handout    Comprehension Verbalized understanding;Returned demonstration            PT Short Term Goals - 12/23/20 1112      PT SHORT TERM GOAL #1   Title be independent in initial HEP for neutral spinal alignment and core.    Time 4    Period Weeks    Status New    Target Date 01/20/21      PT SHORT TERM GOAL #2   Title Pt will be mindful of neutral spine and setting shoulder prior to lifting, reaching    Time 4    Period Weeks    Status New    Target Date 01/20/21      PT SHORT TERM GOAL #3   Title Pt will understand FOTO score and potential to improve.    Time 4    Period Weeks    Status New    Target Date 01/20/21             PT  Long Term Goals - 12/23/20 1113      PT LONG TERM GOAL #1   Title ind with advanced HEP    Time 8    Period Weeks    Status New    Target Date 02/17/21      PT LONG TERM GOAL #2   Title FOTO score will improve to 60 % or more able    Time 8    Period Weeks    Status New    Target Date 02/17/21       PT LONG TERM GOAL #3   Title Pt will be able to walk 3 -5 miles with pain and discomfort in back, LEs < 2/10.    Time 8    Period Weeks    Status New    Target Date 02/17/21      PT LONG TERM GOAL #4   Title report < or = to 2-3/10 Rt shoulder pain with cleaning, lifting and carrying    Time 8    Period Weeks    Status New    Target Date 02/17/21      PT LONG TERM GOAL #5   Title Pt will be able to particiapte in full body conditioning program with min increase in pain from baseline that resolves with rest within 2 hours    Time 8    Period Weeks    Status New    Target Date 02/17/21                  Plan - 12/23/20 1300    Clinical Impression Statement Patient presents for mod complexity eval of joint pain and instability due to gross hypermobility.  She has chronic hip and shoulder pain , some in feet as well.  She has good LE and core strength.  She makes efforts to remain active but does not know how to safely progress and push her body in the process.  She demonstarted some of the exercises she does and they are good BUT could use some foundational understanding of her condition, core and proprioceptive work. She would like to use the Pilates equipment and I think this is a good idea, she may be able to cont with community based Pilates after her PT episode. She she do quite well overall with skilled PT intervention.    Personal Factors and Comorbidities Comorbidity 2;Time since onset of injury/illness/exacerbation    Comorbidities lupus, hypermobility, Rt CTS release    Examination-Activity Limitations Carry;Bed Mobility;Lift;Stand;Stairs;Squat;Sleep;Reach Overhead    Examination-Participation Restrictions Community Activity;Laundry;Shop;Occupation    Stability/Clinical Decision Making Evolving/Moderate complexity    Clinical Decision Making Moderate    Rehab Potential Excellent    PT Frequency 1x / week    PT Duration 8 weeks    PT Treatment/Interventions ADLs/Self  Care Home Management;Cryotherapy;Electrical Stimulation;Moist Heat;Stair training;Functional mobility training;Therapeutic activities;Therapeutic exercise;Balance training;Neuromuscular re-education;Manual techniques;Patient/family education;Passive range of motion;Dry needling;Joint Manipulations;Taping;Other (comment)   Pilates based PT   PT Next Visit Plan check HEP , emailed to patient on eval. Neutral spine . Reformer foot work    PT Home Exercise Plan [email or web address]    Consulted and Agree with Plan of Care Patient           Patient will benefit from skilled therapeutic intervention in order to improve the following deficits and impairments:  Decreased activity tolerance,Decreased strength,Decreased balance,Pain,Decreased endurance,Decreased coordination,Impaired UE functional use,Hypermobility,Decreased knowledge of precautions  Visit Diagnosis: Pain in left hip  Chronic right shoulder pain  Polyarthralgia  Hypermobility  syndrome     Problem List Patient Active Problem List   Diagnosis Date Noted  . Hypermobility syndrome 11/27/2020  . Seasonal and perennial allergic rhinitis 11/06/2018  . Mild intermittent asthma, uncomplicated 28/63/8177  . Flexural atopic dermatitis 11/06/2018  . History of hyperlipidemia 11/08/2016  . History of anxiety 11/08/2016  . History of retinal tear 11/08/2016  . History of Bilateral plantar fasciitis 08/10/2016  . High risk medication use 07/04/2016  . Lupus ([place]) 05/23/2016  . 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 ADHD (attention deficit hyperactivity disorder) 12/22/2014  . Raynaud's phenomenon 10/17/2013  . Hyperlipidemia 09/28/2011    Jannetta Massey 12/23/2020, 1:09 PM  [facility] [address] [place], [place], [address] Phone: [phone]   Fax:  [phone]  Name: Robin [name] MRN: [id number] Date of Birth: Feb 26, 1971   Raeford Razor, PT 12/23/20 1:09 PM Phone:  [phone] Fax: [phone]

## 2020-12-23 NOTE — Patient Instructions (Signed)
Access Code: [email or web address] URL: [email or web address] Date: 12/23/2020 Prepared by: Raeford Razor  Exercises Hooklying Transversus Abdominis Palpation - 1 x daily - 7 x weekly - 2 sets - 10 reps - 5 hold Supine Transversus Abdominis Bracing with Leg Extension - 1 x daily - 7 x weekly - 2 sets - 10 reps Supine 90/90 Abdominal Bracing - 1 x daily - 7 x weekly - 1 sets - 5 reps - 30 hold Supine 90/90 Alternating Heel Touches with Posterior Pelvic Tilt - 1 x daily - 7 x weekly - 2 sets - 10 reps Pilates Bridge - 1 x daily - 7 x weekly - 2 sets - 10 reps - 5 hold

## 2020-12-24 ENCOUNTER — Telehealth: Payer: Self-pay | Admitting: Family Medicine

## 2020-12-24 NOTE — Telephone Encounter (Signed)
[facility] needs directions for patient's Rayos, please advise, thanks.

## 2020-12-28 ENCOUNTER — Other Ambulatory Visit: Payer: Self-pay | Admitting: Allergy & Immunology

## 2020-12-30 ENCOUNTER — Other Ambulatory Visit: Payer: Self-pay

## 2020-12-30 ENCOUNTER — Ambulatory Visit: Payer: BC Managed Care – PPO | Admitting: Physical Therapy

## 2020-12-30 ENCOUNTER — Encounter: Payer: Self-pay | Admitting: Physical Therapy

## 2020-12-30 DIAGNOSIS — M25552 Pain in left hip: Secondary | ICD-10-CM | POA: Diagnosis not present

## 2020-12-30 DIAGNOSIS — M2550 Pain in unspecified joint: Secondary | ICD-10-CM | POA: Diagnosis not present

## 2020-12-30 DIAGNOSIS — M25511 Pain in right shoulder: Secondary | ICD-10-CM

## 2020-12-30 DIAGNOSIS — M357 Hypermobility syndrome: Secondary | ICD-10-CM | POA: Diagnosis not present

## 2020-12-30 DIAGNOSIS — M6281 Muscle weakness (generalized): Secondary | ICD-10-CM | POA: Diagnosis not present

## 2020-12-30 DIAGNOSIS — G8929 Other chronic pain: Secondary | ICD-10-CM | POA: Diagnosis not present

## 2020-12-30 NOTE — Therapy (Signed)
[place], [place], [address] Phone: [phone]   Fax:  [phone]  Physical Therapy Treatment  Patient Details  Name: Robin [name] MRN: [id number] Date of Birth: 07-31-1971 Referring Provider (PT): Dr. Clearance Coots   Encounter Date: 12/30/2020   PT End of Session - 12/30/20 0836    Visit Number 2    Number of Visits 8    Date for PT Re-Evaluation 02/17/21    Authorization Type BCBS    PT Start Time 0830    PT Stop Time 0915    PT Time Calculation (min) 45 min           Past Medical History:  Diagnosis Date  . ADD (attention deficit disorder)   . Allergy   . Anemia   . Anxiety   . Asthma   . Depression   . Hyperlipidemia   . Ileitis   . Lupus ([place])   . PONV (postoperative nausea and vomiting)    mild nausea   . Raynaud disease     Past Surgical History:  Procedure Laterality Date  . CARPAL TUNNEL RELEASE Right    08/2019  . CESAREAN SECTION     x 2  . DENTAL SURGERY    . DILITATION & CURRETTAGE/HYSTROSCOPY WITH NOVASURE ABLATION N/A 06/03/2020   Procedure: DILATATION & CURETTAGE/HYSTEROSCOPY WITH NOVASURE ABLATION;  Surgeon: Aletha Halim, MD;  Location: [facility];  Service: Gynecology;  Laterality: N/A;  . ENDOMETRIAL BIOPSY  04/20/2020      . EYE SURGERY Bilateral    cryoretinopexy    There were no vitals filed for this visit.   Subjective Assessment - 12/30/20 0835    Subjective Generally achy. Pain in big toes.  L low back and hip are hurting too.    Currently in Pain? Yes    Pain Score 4    6/10 feet, general 2/10   Pain Location Back    Pain Orientation Left;Lower    Pain Descriptors / Indicators Aching    Pain Type Chronic pain    Pain Onset More than a month ago    Pain Frequency Intermittent                [facility] Adult PT Treatment/Exercise - 12/30/20 0001      Pilates   Pilates Reformer see note      Lumbar Exercises: Supine    Ab Set 10 reps    Pelvic Tilt Limitations to find neutral    Clam 10 reps    Heel Slides 10 reps    Bent Knee Raise 10 reps    Dead Bug 10 reps    Bridge 10 reps            Pilates Reformer used for LE/core strength, postural strength, lumbopelvic disassociation and core control.  Exercises included:  Footwork 2 red 1 blue  Basic intro  Parallel on heels, turnout on toes narrow and wide  Heel raises 3 ways x 12 reps  Addressing ankle stability and avoiding supination, inversion in ankles  bilaterally  Bridging 2 red 1 blue articulation  Used ball for alignment Done about 10 reps  Used Pilates circle for outer hip activation for bridging x 6  Pt had better glute activation with circle but did find it more difficult to control her bridge        PT Education - 12/30/20 0857    Education Details HEP, Pilates, concepts and principles  Person(s) Educated Patient    Methods Explanation;Handout    Comprehension Verbalized understanding;Returned demonstration            PT Short Term Goals - 12/23/20 1112      PT SHORT TERM GOAL #1   Title be independent in initial HEP for neutral spinal alignment and core.    Time 4    Period Weeks    Status New    Target Date 01/20/21      PT SHORT TERM GOAL #2   Title Pt will be mindful of neutral spine and setting shoulder prior to lifting, reaching    Time 4    Period Weeks    Status New    Target Date 01/20/21      PT SHORT TERM GOAL #3   Title Pt will understand FOTO score and potential to improve.    Time 4    Period Weeks    Status New    Target Date 01/20/21             PT Long Term Goals - 12/23/20 1113      PT LONG TERM GOAL #1   Title ind with advanced HEP    Time 8    Period Weeks    Status New    Target Date 02/17/21      PT LONG TERM GOAL #2   Title FOTO score will improve to 60 % or more able    Time 8    Period Weeks    Status New    Target Date 02/17/21      PT LONG TERM GOAL #3   Title  Pt will be able to walk 3 -5 miles with pain and discomfort in back, LEs < 2/10.    Time 8    Period Weeks    Status New    Target Date 02/17/21      PT LONG TERM GOAL #4   Title report < or = to 2-3/10 Rt shoulder pain with cleaning, lifting and carrying    Time 8    Period Weeks    Status New    Target Date 02/17/21      PT LONG TERM GOAL #5   Title Pt will be able to particiapte in full body conditioning program with min increase in pain from baseline that resolves with rest within 2 hours    Time 8    Period Weeks    Status New    Target Date 02/17/21                 Plan - 12/30/20 0837    Clinical Impression Statement Patietn given info and introduction to Pilates based stabilization for LE/Lumbar spine today. She did well and experienced no increased pain .  Showed good body awareness and able to connect well with abdominals during Reformer and mat exercises.  Cont POC.    PT Treatment/Interventions ADLs/Self Care Home Management;Cryotherapy;Electrical Stimulation;Moist Heat;Stair training;Functional mobility training;Therapeutic activities;Therapeutic exercise;Balance training;Neuromuscular re-education;Manual techniques;Patient/family education;Passive range of motion;Dry needling;Joint Manipulations;Taping;Other (comment)    PT Next Visit Plan check in with HEP, Pilates- Reformer (FIS, supine abd , q ped)    PT Home Exercise Plan [email or web address]    Consulted and Agree with Plan of Care Patient           Patient will benefit from skilled therapeutic intervention in order to improve the following deficits and impairments:  Decreased activity tolerance,Decreased strength,Decreased balance,Pain,Decreased endurance,Decreased coordination,Impaired UE functional use,Hypermobility,Decreased knowledge  of precautions  Visit Diagnosis: Pain in left hip  Chronic right shoulder pain  Polyarthralgia  Hypermobility syndrome  Muscle weakness (generalized)     Problem  List Patient Active Problem List   Diagnosis Date Noted  . Hypermobility syndrome 11/27/2020  . Seasonal and perennial allergic rhinitis 11/06/2018  . Mild intermittent asthma, uncomplicated 56/81/2751  . Flexural atopic dermatitis 11/06/2018  . History of hyperlipidemia 11/08/2016  . History of anxiety 11/08/2016  . History of retinal tear 11/08/2016  . History of Bilateral plantar fasciitis 08/10/2016  . High risk medication use 07/04/2016  . Lupus ([place]) 05/23/2016  . 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 ADHD (attention deficit hyperactivity disorder) 12/22/2014  . Raynaud's phenomenon 10/17/2013  . Hyperlipidemia 09/28/2011    Efrem Pitstick 12/30/2020, 10:14 AM  [place], [place], [address] Phone: [phone]   Fax:  [phone]  Name: Robin [name] MRN: [id number] Date of Birth: [birth year]  Raeford Razor, PT 12/30/20 10:14 AM Phone: [phone] Fax: [phone]

## 2021-01-06 ENCOUNTER — Ambulatory Visit: Payer: BC Managed Care – PPO | Admitting: Physical Therapy

## 2021-01-06 ENCOUNTER — Other Ambulatory Visit: Payer: Self-pay

## 2021-01-06 ENCOUNTER — Encounter: Payer: Self-pay | Admitting: Physical Therapy

## 2021-01-06 DIAGNOSIS — M25552 Pain in left hip: Secondary | ICD-10-CM | POA: Diagnosis not present

## 2021-01-06 DIAGNOSIS — M2550 Pain in unspecified joint: Secondary | ICD-10-CM | POA: Diagnosis not present

## 2021-01-06 DIAGNOSIS — M6281 Muscle weakness (generalized): Secondary | ICD-10-CM

## 2021-01-06 DIAGNOSIS — M357 Hypermobility syndrome: Secondary | ICD-10-CM

## 2021-01-06 DIAGNOSIS — M25511 Pain in right shoulder: Secondary | ICD-10-CM | POA: Diagnosis not present

## 2021-01-06 DIAGNOSIS — G8929 Other chronic pain: Secondary | ICD-10-CM

## 2021-01-06 NOTE — Therapy (Signed)
[place], [place], [address] Phone: [phone]   Fax:  [phone]  Physical Therapy Treatment  Patient Details  Name: Robin [name] MRN: [id number] Date of Birth: [birth year] Referring Provider (PT): Dr. Clearance Coots   Encounter Date: 01/06/2021   PT End of Session - 01/06/21 0841    Visit Number 3    Number of Visits 8    Date for PT Re-Evaluation 02/17/21    Authorization Type BCBS    PT Start Time [phone]    PT Stop Time 0915    PT Time Calculation (min) 43 min           Past Medical History:  Diagnosis Date  . ADD (attention deficit disorder)   . Allergy   . Anemia   . Anxiety   . Asthma   . Depression   . Hyperlipidemia   . Ileitis   . Lupus ([place])   . PONV (postoperative nausea and vomiting)    mild nausea   . Raynaud disease     Past Surgical History:  Procedure Laterality Date  . CARPAL TUNNEL RELEASE Right    08/2019  . CESAREAN SECTION     x 2  . DENTAL SURGERY    . DILITATION & CURRETTAGE/HYSTROSCOPY WITH NOVASURE ABLATION N/A 06/03/2020   Procedure: DILATATION & CURETTAGE/HYSTEROSCOPY WITH NOVASURE ABLATION;  Surgeon: Aletha Halim, MD;  Location: [facility];  Service: Gynecology;  Laterality: N/A;  . ENDOMETRIAL BIOPSY  04/20/2020      . EYE SURGERY Bilateral    cryoretinopexy    There were no vitals filed for this visit.   Subjective Assessment - 01/06/21 0839    Subjective Last time had some abdominal cramping which was weird.  Hip hurts today, 5/10.    Currently in Pain? Yes    Pain Score 5     Pain Location Hip    Pain Orientation Left;Posterior;Lateral    Pain Descriptors / Indicators Aching    Pain Type Chronic pain    Pain Onset More than a month ago    Pain Frequency Intermittent    Multiple Pain Sites No             [facility] Adult PT Treatment/Exercise - 01/06/21 0001      Pilates   Pilates Reformer s    Pilates Tower see note             Pilates Tower for LE/Core strength, postural strength, lumbopelvic disassociation and core control.  Exercises included:  Supine Leg [place]  Single leg x 10 in parallel then with hip ER  Double leg Arcs parallel, ER    circles then squats (with hip ER) x 10 each   Scissor x 10   [place]   [place]   Arms arcs and circles x 10-15 reps   Bridging x 10   Sidelying legs [place] Adduction, hip flexion and extension x 10 each Small circles   Cues for avoiding rolling forward with pelvis in sidelying          PT Short Term Goals - 12/23/20 1112      PT SHORT TERM GOAL #1   Title be independent in initial HEP for neutral spinal alignment and core.    Time 4    Period Weeks    Status New    Target Date 01/20/21      PT SHORT TERM GOAL #2   Title  Pt will be mindful of neutral spine and setting shoulder prior to lifting, reaching    Time 4    Period Weeks    Status New    Target Date 01/20/21      PT SHORT TERM GOAL #3   Title Pt will understand FOTO score and potential to improve.    Time 4    Period Weeks    Status New    Target Date 01/20/21             PT Long Term Goals - 12/23/20 1113      PT LONG TERM GOAL #1   Title ind with advanced HEP    Time 8    Period Weeks    Status New    Target Date 02/17/21      PT LONG TERM GOAL #2   Title FOTO score will improve to 60 % or more able    Time 8    Period Weeks    Status New    Target Date 02/17/21      PT LONG TERM GOAL #3   Title Pt will be able to walk 3 -5 miles with pain and discomfort in back, LEs < 2/10.    Time 8    Period Weeks    Status New    Target Date 02/17/21      PT LONG TERM GOAL #4   Title report < or = to 2-3/10 Rt shoulder pain with cleaning, lifting and carrying    Time 8    Period Weeks    Status New    Target Date 02/17/21      PT LONG TERM GOAL #5   Title Pt will be able to particiapte in full body conditioning program  with min increase in pain from baseline that resolves with rest within 2 hours    Time 8    Period Weeks    Status New    Target Date 02/17/21                 Plan - 01/06/21 0856    Clinical Impression Statement Switched session to Pilates Tower today. Asymmetry noted in L hip strength (abduction).  Able to complete all exercises for strength and stability.  Did feel some SIJ discomfort with roll ups. Discussed her ability to perform steps without compensation , plan to check back in with that at her next visit to assess progress.    PT Treatment/Interventions ADLs/Self Care Home Management;Cryotherapy;Electrical Stimulation;Moist Heat;Stair training;Functional mobility training;Therapeutic activities;Therapeutic exercise;Balance training;Neuromuscular re-education;Manual techniques;Patient/family education;Passive range of motion;Dry needling;Joint Manipulations;Taping;Other (comment)    PT Next Visit Plan check in with HEP, step ups, downs. Pilates- Reformer (FIS, supine abd , q ped)    PT Home Exercise Plan [email or web address]    Consulted and Agree with Plan of Care Patient           Patient will benefit from skilled therapeutic intervention in order to improve the following deficits and impairments:  Decreased activity tolerance,Decreased strength,Decreased balance,Pain,Decreased endurance,Decreased coordination,Impaired UE functional use,Hypermobility,Decreased knowledge of precautions  Visit Diagnosis: Pain in left hip  Chronic right shoulder pain  Polyarthralgia  Hypermobility syndrome  Muscle weakness (generalized)     Problem List Patient Active Problem List   Diagnosis Date Noted  . Hypermobility syndrome 11/27/2020  . Seasonal and perennial allergic rhinitis 11/06/2018  . Mild intermittent asthma, uncomplicated 40/04/6760  . Flexural atopic dermatitis 11/06/2018  . History of hyperlipidemia 11/08/2016  . History of anxiety  11/08/2016  . History of retinal tear  11/08/2016  . History of Bilateral plantar fasciitis 08/10/2016  . High risk medication use 07/04/2016  . Lupus ([place]) 05/23/2016  . 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 ADHD (attention deficit hyperactivity disorder) 12/22/2014  . Raynaud's phenomenon 10/17/2013  . Hyperlipidemia 09/28/2011    Krislyn Donnan 01/06/2021, 9:28 AM  [facility] [address] [place], [place], [address] Phone: [phone]   Fax:  [phone]  Name: Robin [name] MRN: [id number] Date of Birth: [birth year]  Raeford Razor, PT 01/06/21 9:31 AM Phone: [phone] Fax: [phone]

## 2021-01-08 ENCOUNTER — Ambulatory Visit (INDEPENDENT_AMBULATORY_CARE_PROVIDER_SITE_OTHER): Payer: BC Managed Care – PPO

## 2021-01-08 DIAGNOSIS — J309 Allergic rhinitis, unspecified: Secondary | ICD-10-CM

## 2021-01-12 NOTE — Progress Notes (Signed)
Office Visit Note  Patient: Robin [name]             Date of Birth: 1971/05/03           MRN: [id number]             PCP: Jacelyn Pi, Lilia Argue, MD Referring: Jacelyn Pi, Lilia Argue, * Visit Date: 01/26/2021 Occupation: @GUAROCC @  Subjective:  Joint pain.   History of Present Illness: Robin [name] is a 50 y.o. female with a history of systemic lupus erythematosus.  She continues to have Raynauds symptoms which are manageable.  She states that she also have a lot of discomfort in her joints but no joint swelling.  She was seen by Dr. Tamala Julian for plantar fasciitis and pain in her shoulders and hip joints.  She was placed on Rayos 5 mg p.o. nightly as needed.  Patient states for the last month she has been taking Raynauds 5 mg every night.  She states she has noticed improvement in her joint symptoms.  She is also going for physical therapy which is helped her shoulders and her hips.  She has not had any recent episodes of plantar fasciitis.  She has not had erythema nodosum lesions in a long time.  Activities of Daily Living:  Patient reports morning stiffness for 0 minutes.   Patient Reports nocturnal pain.  Difficulty dressing/grooming: Denies Difficulty climbing stairs: Reports Difficulty getting out of chair: Denies Difficulty using hands for taps, buttons, cutlery, and/or writing: Denies  Review of Systems  Constitutional: Negative for fatigue, night sweats, weight gain and weight loss.  HENT: Positive for mouth sores, mouth dryness and nose dryness. Negative for trouble swallowing and trouble swallowing.        Nose sores  Eyes: Positive for dryness. Negative for pain, redness, itching and visual disturbance.  Respiratory: Negative for cough, shortness of breath and difficulty breathing.   Cardiovascular: Negative for chest pain, palpitations, hypertension, irregular heartbeat and swelling in legs/feet.  Gastrointestinal: Negative for blood in stool, constipation and  diarrhea.  Endocrine: Negative for increased urination.  Genitourinary: Negative for difficulty urinating and vaginal dryness.  Musculoskeletal: Positive for arthralgias and joint pain. Negative for joint swelling, myalgias, muscle weakness, morning stiffness, muscle tenderness and myalgias.  Skin: Positive for color change. Negative for rash, hair loss, redness, skin tightness, ulcers and sensitivity to sunlight.  Allergic/Immunologic: Negative for susceptible to infections.  Neurological: Positive for dizziness, numbness and headaches. Negative for memory loss, night sweats and weakness.  Hematological: Positive for bruising/bleeding tendency. Negative for swollen glands.  Psychiatric/Behavioral: Negative for depressed mood, confusion and sleep disturbance. The patient is not nervous/anxious.     PMFS History:  Patient Active Problem List   Diagnosis Date Noted  . Hypermobility syndrome 11/27/2020  . Seasonal and perennial allergic rhinitis 11/06/2018  . Mild intermittent asthma, uncomplicated 89/16/9450  . Flexural atopic dermatitis 11/06/2018  . History of hyperlipidemia 11/08/2016  . History of anxiety 11/08/2016  . History of retinal tear 11/08/2016  . History of Bilateral plantar fasciitis 08/10/2016  . High risk medication use 07/04/2016  . Lupus ([place]) 05/23/2016  . 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[place]) 03/18/2016  . ADHD (attention deficit hyperactivity disorder) 12/22/2014  . Raynaud's phenomenon 10/17/2013  . Hyperlipidemia 09/28/2011    Past Medical History:  Diagnosis Date  . ADD (attention deficit disorder)   . Allergy   . Anemia   . Anxiety   . Asthma   . Depression   . Hyperlipidemia   .  Ileitis   . Lupus ([place])   . PONV (postoperative nausea and vomiting)    mild nausea   . Raynaud disease     Family History  Problem Relation Age of Onset  . Heart disease Mother 100       CAD/stenting  . Hyperlipidemia Mother   . Arthritis Mother        ostearthritis  . Hypertension  Mother   . Kidney disease Mother   . Irritable bowel syndrome Mother   . Stroke Mother 68       CVA  . Fibromyalgia Mother   . Eczema Mother   . Hyperlipidemia Father   . Neuropathy Father   . Allergies Father        shellfish  . Hyperlipidemia Sister   . Hypertension Brother   . Colon cancer Neg Hx   . Inflammatory bowel disease Neg Hx   . Allergic rhinitis Neg Hx   . Angioedema Neg Hx   . Atopy Neg Hx   . Asthma Neg Hx   . Immunodeficiency Neg Hx   . Urticaria Neg Hx    Past Surgical History:  Procedure Laterality Date  . CARPAL TUNNEL RELEASE Right    08/2019  . CESAREAN SECTION     x 2  . DENTAL SURGERY    . DILITATION & CURRETTAGE/HYSTROSCOPY WITH NOVASURE ABLATION N/A 06/03/2020   Procedure: DILATATION & CURETTAGE/HYSTEROSCOPY WITH NOVASURE ABLATION;  Surgeon: Aletha Halim, MD;  Location: [place];  Service: Gynecology;  Laterality: N/A;  . ENDOMETRIAL BIOPSY  04/20/2020      . EYE SURGERY Bilateral    cryoretinopexy   Social History   Social History Narrative   Marital status: married x 17 years; happily married      Children: 2 children (27, 11)      Lives: with husband, 2 children.      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 Influenza,inj,Quad PF,6+ Mos 05/12/2014, 11/27/2015, 05/23/2016, 10/30/2017, 07/30/2018  . Moderna Sars-Covid-2 Vaccination 10/27/2020  . PFIZER(Purple Top)SARS-COV-2 Vaccination 12/06/2019, 12/16/2019, 05/27/2020  . Pneumococcal Conjugate-13 05/23/2016  . Tdap 09/28/2011, 08/12/2020     Objective: Vital Signs: BP 122/81 (BP Location: Left Arm, Patient Position: Sitting, Cuff Size: Normal)   Pulse 69   Ht 5' 6"  (1.676 m)   Wt 157 lb 3.2 oz (71.3 kg)   BMI 25.37 kg/m    Physical  Exam Vitals and nursing note reviewed.  Constitutional:      Appearance: She is well-developed.  HENT:     Head: Normocephalic and atraumatic.  Eyes:     Conjunctiva/sclera: Conjunctivae normal.  Cardiovascular:     Rate and Rhythm: Normal rate and regular rhythm.     Heart sounds: Normal heart sounds.  Pulmonary:     Effort: Pulmonary effort is normal.     Breath sounds: Normal breath sounds.  Abdominal:     General: Bowel sounds are normal.     Palpations: Abdomen is soft.  Musculoskeletal:     Cervical back: Normal range of motion.  Lymphadenopathy:     Cervical: No cervical adenopathy.  Skin:    General: Skin is warm and dry.     Capillary Refill:  Capillary refill takes 2 to 3 seconds.  Neurological:     Mental Status: She is alert and oriented to person, place, and time.     Comments: Discharge hyperemia was noted.  Decreased capillary refill was noted.  capillary dropout was noted.  Psychiatric:        Behavior: Behavior normal.      Musculoskeletal Exam: C-spine was in good range of motion.  Shoulder joints, elbow joints, wrist joints, MCPs PIPs and DIPs with good range of motion with no synovitis.  Hip joints, knee joints, ankles, MTPs and PIPs with good range of motion with no synovitis.  CDAI Exam: CDAI Score: -- Patient Global: --; Provider Global: -- Swollen: --; Tender: -- Joint Exam 01/26/2021   No joint exam has been documented for this visit   There is currently no information documented on the homunculus. Go to the Rheumatology activity and complete the homunculus joint exam.  Investigation: No additional findings.  Imaging: No results found.  Recent Labs: Lab Results  Component Value Date   WBC 4.2 09/01/2020   HGB 13.4 09/01/2020   PLT 273 09/01/2020   NA 137 09/01/2020   K 4.4 09/01/2020   CL 101 09/01/2020   CO2 28 09/01/2020   GLUCOSE 84 09/01/2020   BUN 12 09/01/2020   CREATININE 0.89 09/01/2020   BILITOT 0.5 09/01/2020   ALKPHOS  26 (L) 06/01/2020   AST 22 09/01/2020   ALT 15 09/01/2020   PROT 7.0 09/01/2020   ALBUMIN 3.8 06/01/2020   CALCIUM 9.6 09/01/2020   GFRAA 89 09/01/2020    Speciality Comments: PLQ Eye Exam: 05/29/2020 WNL @ [facility] (in [place]) Follow up in 1 year.  ANA 1:320 speckled, history of oral ulcers, lymphadenopathy, photosensitivity, Raynauds, erythema nodosum, inflammatory arthritis.  Procedures:  No procedures performed Allergies: Avelox [moxifloxacin hcl in nacl], Cefaclor, Dust mite extract, Latex, Mold extract [trichophyton], Red wine complex [germanium], Meloxicam, and Naproxen   Assessment / Plan:     Visit Diagnoses: Other systemic lupus erythematosus with other organ involvement (HCC) - ANA 1:320 speckled, history of oral ulcers, lymphadenopathy, photosensitivity, Raynauds, erythema nodosum, inflammatory arthritis.:  She did not have any synovitis on examination.  She has had no recurrence of oral ulcers or lymphadenopathy.  She denies photosensitivity.  She has been taking Rayos 5 mg p.o. nightly for the last 1 month.  I have discouraged the use of long-term steroids.  Side effects of long-term steroid use was discussed.  Have advised her to taper prednisone and come off over the next 1 to 2 weeks.  We will also see if there is any change in her lab work while she is on prednisone.  I may consider getting hydroxychloroquine level at the next visit.- Plan: Urinalysis, Routine w reflex microscopic, Anti-DNA antibody, double-stranded, C3 and C4, Sedimentation rate  High risk medication use - Plaquenil 200 mg 1 tablet by mouth twice daily M-F.PLQ Eye Exam: 05/29/2020  - Plan: CBC with Differential/Platelet, COMPLETE METABOLIC PANEL WITH GFR  Raynaud's phenomenon without gangrene-she continues to have significant Raynauds symptoms.  She decreased capillary refill.  Keeping core temperature warm and keeping her hands warm was discussed.  No digital ulcers were  noted.  Erythema nodosum - No recurrence.  Other fatigue-improved.  Chronic right shoulder pain -she is feeling better since she has been going to PT.  Pain in left hip - s/p steroid injection.  She is going to physical therapy now which has helped.  History of  hyperlipidemia-increased risk of heart disease with autoimmune disease was discussed.  Dietary modifications and exercise were discussed.  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 CBC with Differential/Platelet  . COMPLETE METABOLIC PANEL WITH GFR  . Urinalysis, Routine w reflex microscopic  . Anti-DNA antibody, double-stranded  . C3 and C4  . Sedimentation rate  . Phosphorus   No orders of the defined types were placed in this encounter.    Follow-Up Instructions: Return in about 3 months (around 04/28/2021) for Systemic lupus.   Bo Merino, MD  Note - This record has been created using Editor, commissioning.  Chart creation errors have been sought, but may not always  have been located. Such creation errors do not reflect on  the standard of medical care.

## 2021-01-15 ENCOUNTER — Ambulatory Visit (INDEPENDENT_AMBULATORY_CARE_PROVIDER_SITE_OTHER): Payer: BC Managed Care – PPO

## 2021-01-15 ENCOUNTER — Other Ambulatory Visit: Payer: Self-pay

## 2021-01-15 ENCOUNTER — Ambulatory Visit: Payer: BC Managed Care – PPO | Attending: Physician Assistant | Admitting: Physical Therapy

## 2021-01-15 DIAGNOSIS — M357 Hypermobility syndrome: Secondary | ICD-10-CM | POA: Diagnosis not present

## 2021-01-15 DIAGNOSIS — M25552 Pain in left hip: Secondary | ICD-10-CM | POA: Diagnosis not present

## 2021-01-15 DIAGNOSIS — J309 Allergic rhinitis, unspecified: Secondary | ICD-10-CM | POA: Diagnosis not present

## 2021-01-15 DIAGNOSIS — M25511 Pain in right shoulder: Secondary | ICD-10-CM | POA: Insufficient documentation

## 2021-01-15 DIAGNOSIS — M2550 Pain in unspecified joint: Secondary | ICD-10-CM

## 2021-01-15 DIAGNOSIS — M6281 Muscle weakness (generalized): Secondary | ICD-10-CM | POA: Insufficient documentation

## 2021-01-15 DIAGNOSIS — G8929 Other chronic pain: Secondary | ICD-10-CM | POA: Insufficient documentation

## 2021-01-15 NOTE — Therapy (Signed)
[place], [place], [address] Phone: [phone]   Fax:  [phone]  Physical Therapy Treatment  Patient Details  Name: Robin [name] MRN: [id number] Date of Birth: June 09, 1971 Referring Provider (PT): Dr. Clearance Coots   Encounter Date: 01/15/2021   PT End of Session - 01/15/21 0915    Visit Number 4    Number of Visits 8    Date for PT Re-Evaluation 02/17/21    Authorization Type BCBS    PT Start Time 0915    PT Stop Time 1000    PT Time Calculation (min) 45 min    Activity Tolerance Patient tolerated treatment well    Behavior During Therapy [facility] for tasks assessed/performed           Past Medical History:  Diagnosis Date  . ADD (attention deficit disorder)   . Allergy   . Anemia   . Anxiety   . Asthma   . Depression   . Hyperlipidemia   . Ileitis   . Lupus ([place])   . PONV (postoperative nausea and vomiting)    mild nausea   . Raynaud disease     Past Surgical History:  Procedure Laterality Date  . CARPAL TUNNEL RELEASE Right    08/2019  . CESAREAN SECTION     x 2  . DENTAL SURGERY    . DILITATION & CURRETTAGE/HYSTROSCOPY WITH NOVASURE ABLATION N/A 06/03/2020   Procedure: DILATATION & CURETTAGE/HYSTEROSCOPY WITH NOVASURE ABLATION;  Surgeon: Aletha Halim, MD;  Location: [place];  Service: Gynecology;  Laterality: N/A;  . ENDOMETRIAL BIOPSY  04/20/2020      . EYE SURGERY Bilateral    cryoretinopexy    There were no vitals filed for this visit.   Subjective Assessment - 01/15/21 0917    Subjective Shoulder and hips have been hurting .  On a steriod now.  Pain is low, 2/10.    Currently in Pain? Yes    Pain Score 2     Pain Location Shoulder    Pain Orientation Right;Left    Pain Descriptors / Indicators Aching    Pain Type Chronic pain    Pain Onset More than a month ago    Aggravating Factors  sitting , increased computer work    Pain Relieving Factors  meds, activity, stretching    Multiple Pain Sites No    Pain Score --   not really in hips , last week was incr SIJ            [facility] Adult PT Treatment/Exercise - 01/15/21 0001      Lumbar Exercises: Standing   Other Standing Lumbar Exercises TRX single leg hip hinge x 10 each LE      Lumbar Exercises: Supine   Bridge Limitations banded bridge x 15 green /blue    Advanced Lumbar Stabilization Limitations dead bug x 10    Straight Leg Raises Limitations pelvis focus x 10 with VMO      Knee/Hip Exercises: Standing   Hip Abduction Both;1 set;15 reps;Knee straight    Lateral Step Up Both;1 set;15 reps;Hand Hold: 1;Hand Hold: 0;Step Height: 8"    Functional Squat Limitations 15 lbs x 2 sets x 15    Other Standing Knee Exercises dead lift 15lbs x 15 x 2 sets      Knee/Hip Exercises: Sidelying   Clams on elbow, no band x 20      Shoulder Exercises: Supine   Other Supine Exercises  lat pull down x 10 green band    Other Supine Exercises narrow grip overhead lift with green band      Shoulder Exercises: Standing   Other Standing Exercises TRX: Row and bicep curls                    PT Short Term Goals - 01/15/21 1020      PT SHORT TERM GOAL #1   Title be independent in initial HEP for neutral spinal alignment and core.    Status Achieved      PT SHORT TERM GOAL #2   Title Pt will be mindful of neutral spine and setting shoulder prior to lifting, reaching    Status Achieved      PT SHORT TERM GOAL #3   Title Pt will understand FOTO score and potential to improve.    Status Achieved             PT Long Term Goals - 01/15/21 1020      PT LONG TERM GOAL #1   Title ind with advanced HEP    Status On-going      PT LONG TERM GOAL #2   Title FOTO score will improve to 60 % or more able    Status On-going      PT LONG TERM GOAL #3   Title Pt will be able to walk 3 -5 miles with pain and discomfort in back, LEs < 2/10.    Status On-going      PT LONG TERM  GOAL #4   Title report < or = to 2-3/10 Rt shoulder pain with cleaning, lifting and carrying    Status On-going      PT LONG TERM GOAL #5   Title Pt will be able to particiapte in full body conditioning program with min increase in pain from baseline that resolves with rest within 2 hours    Status On-going                 Plan - 01/15/21 0915    Clinical Impression Statement Patient continues to have chronic, diffuse pain due to joint hypermobility, instability. Therapy is benefitting her body awareness and proprioception, improving her confidence with activity.  She is interested in continuing with Pilates mat/equipment post PT. She was challenged today with loading of LEs and core with more traditional exercises. Upgraded HEP to reflect improvements. Cont POC.    PT Treatment/Interventions ADLs/Self Care Home Management;Cryotherapy;Electrical Stimulation;Moist Heat;Stair training;Functional mobility training;Therapeutic activities;Therapeutic exercise;Balance training;Neuromuscular re-education;Manual techniques;Patient/family education;Passive range of motion;Dry needling;Joint Manipulations;Taping;Other (comment)    PT Next Visit Plan cont Pilates , stabiization, core, scap and hips    PT Home Exercise Plan [id number]    Consulted and Agree with Plan of Care Patient           Patient will benefit from skilled therapeutic intervention in order to improve the following deficits and impairments:  Decreased activity tolerance,Decreased strength,Decreased balance,Pain,Decreased endurance,Decreased coordination,Impaired UE functional use,Hypermobility,Decreased knowledge of precautions  Visit Diagnosis: Pain in left hip  Chronic right shoulder pain  Polyarthralgia  Hypermobility syndrome  Muscle weakness (generalized)     Problem List Patient Active Problem List   Diagnosis Date Noted  . Hypermobility syndrome 11/27/2020  . Seasonal and perennial allergic rhinitis  11/06/2018  . Mild intermittent asthma, uncomplicated 32/95/1884  . Flexural atopic dermatitis 11/06/2018  . History of hyperlipidemia 11/08/2016  . History of anxiety 11/08/2016  . History of retinal tear 11/08/2016  .  History of Bilateral plantar fasciitis 08/10/2016  . High risk medication use 07/04/2016  . Lupus ([place]) 05/23/2016  . 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 ADHD (attention deficit hyperactivity disorder) 12/22/2014  . Raynaud's phenomenon 10/17/2013  . Hyperlipidemia 09/28/2011    Deveon Kisiel 01/15/2021, 10:28 AM  [facility] [address] [place], [place], [address] Phone: [phone]   Fax:  [phone]  Name: ALVIRA HECHT MRN: [id number] Date of Birth: 1971-03-10  Raeford Razor, PT 01/15/21 10:28 AM Phone: [phone] Fax: [phone]

## 2021-01-20 ENCOUNTER — Other Ambulatory Visit: Payer: Self-pay

## 2021-01-20 ENCOUNTER — Encounter: Payer: Self-pay | Admitting: Physical Therapy

## 2021-01-20 ENCOUNTER — Ambulatory Visit: Payer: BC Managed Care – PPO | Admitting: Physical Therapy

## 2021-01-20 DIAGNOSIS — G8929 Other chronic pain: Secondary | ICD-10-CM | POA: Diagnosis not present

## 2021-01-20 DIAGNOSIS — M357 Hypermobility syndrome: Secondary | ICD-10-CM

## 2021-01-20 DIAGNOSIS — M2550 Pain in unspecified joint: Secondary | ICD-10-CM | POA: Diagnosis not present

## 2021-01-20 DIAGNOSIS — M25511 Pain in right shoulder: Secondary | ICD-10-CM | POA: Diagnosis not present

## 2021-01-20 DIAGNOSIS — M25552 Pain in left hip: Secondary | ICD-10-CM

## 2021-01-20 DIAGNOSIS — M6281 Muscle weakness (generalized): Secondary | ICD-10-CM | POA: Diagnosis not present

## 2021-01-20 NOTE — Therapy (Signed)
[place], [place], [address] Phone: [phone]   Fax:  [phone]  Physical Therapy Treatment  Patient Details  Name: Robin [name] MRN: [id number] Date of Birth: [birth year] Referring Provider (PT): Dr. Clearance Coots   Encounter Date: 01/20/2021   PT End of Session - 01/20/21 1017    Visit Number 5    Number of Visits 8    Date for PT Re-Evaluation 02/17/21    Authorization Type BCBS    PT Start Time 1010    PT Stop Time 1053    PT Time Calculation (min) 43 min    Activity Tolerance Patient tolerated treatment well    Behavior During Therapy [facility] for tasks assessed/performed           Past Medical History:  Diagnosis Date  . ADD (attention deficit disorder)   . Allergy   . Anemia   . Anxiety   . Asthma   . Depression   . Hyperlipidemia   . Ileitis   . Lupus ([place])   . PONV (postoperative nausea and vomiting)    mild nausea   . Raynaud disease     Past Surgical History:  Procedure Laterality Date  . CARPAL TUNNEL RELEASE Right    08/2019  . CESAREAN SECTION     x 2  . DENTAL SURGERY    . DILITATION & CURRETTAGE/HYSTROSCOPY WITH NOVASURE ABLATION N/A 06/03/2020   Procedure: DILATATION & CURETTAGE/HYSTEROSCOPY WITH NOVASURE ABLATION;  Surgeon: Aletha Halim, MD;  Location: [place];  Service: Gynecology;  Laterality: N/A;  . ENDOMETRIAL BIOPSY  04/20/2020      . EYE SURGERY Bilateral    cryoretinopexy    There were no vitals filed for this visit.   Subjective Assessment - 01/20/21 1012    Subjective Not really hurting today.  I was sore after last visit from lifting more weight.  Rt shoulder sometimes feels painful and when I use it it gets aching and sore.  Points to deltoid.    Currently in Pain? No/denies               [facility] Adult PT Treatment/Exercise - 01/20/21 0001      Pilates   Pilates Reformer see note      Manual Therapy   Manual Therapy  Taping    Kinesiotex Inhibit Muscle;Ligament Correction      Kinesiotix   Inhibit Muscle  deltoid Y ant and post with stretch, 25%            Pilates Reformer used for LE/core strength, postural strength, lumbopelvic disassociation and core control.  Exercises included:  Footwork  2 Red 1 Blue and 1 Yellow with blue band  On heels in parallel and then in hip ER   Bridging  X 10 , cues for slowing and controlling   Supine Arms 1 Red 1 yellow Arcs in parallel and then in T x 10 , mod cues to slow arcs and maintain LE and trunk stable   Feet in Straps 1 Red 1 Yellow    Arcs circles and squats x 10 each    Reverse Abdominals 1 Red UE x 5 and LE x 8   Seated Arms  Red spring roll down then blue x 10   Added row and horizontal , single arm diagonal pull x 10 each side   Tall kneeling shoulder ER and flexion with blue spring, cues to avoid trunk compensation , about 8-10 reps  each side , close S for safety    Standing spine stretch 1 Red spring    Added shoulder press x 5       PT Education - 01/20/21 1103    Education Details KT tape, technique throughout session using Pilates equipment    Person(s) Educated Patient    Methods Explanation    Comprehension Verbalized understanding;Returned demonstration            PT Short Term Goals - 01/15/21 1020      PT SHORT TERM GOAL #1   Title be independent in initial HEP for neutral spinal alignment and core.    Status Achieved      PT SHORT TERM GOAL #2   Title Pt will be mindful of neutral spine and setting shoulder prior to lifting, reaching    Status Achieved      PT SHORT TERM GOAL #3   Title Pt will understand FOTO score and potential to improve.    Status Achieved             PT Long Term Goals - 01/15/21 1020      PT LONG TERM GOAL #1   Title ind with advanced HEP    Status On-going      PT LONG TERM GOAL #2   Title FOTO score will improve to 60 % or more able    Status On-going      PT LONG TERM  GOAL #3   Title Pt will be able to walk 3 -5 miles with pain and discomfort in back, LEs < 2/10.    Status On-going      PT LONG TERM GOAL #4   Title report < or = to 2-3/10 Rt shoulder pain with cleaning, lifting and carrying    Status On-going      PT LONG TERM GOAL #5   Title Pt will be able to particiapte in full body conditioning program with min increase in pain from baseline that resolves with rest within 2 hours    Status On-going                 Plan - 01/20/21 1106    Clinical Impression Statement Progressed Robin [name] on the Pilates Reformer today to include different positions and use of UEs.  She need min cues for compensatory patterns with shoulder exercises.  Overall good strength and control.  Taped Rt deltoid for support as she complains of tension with certain arm movements and even when arms are stationary and just stabilizing with LE movement.    PT Treatment/Interventions ADLs/Self Care Home Management;Cryotherapy;Electrical Stimulation;Moist Heat;Stair training;Functional mobility training;Therapeutic activities;Therapeutic exercise;Balance training;Neuromuscular re-education;Manual techniques;Patient/family education;Passive range of motion;Dry needling;Joint Manipulations;Taping;Other (comment)    PT Next Visit Plan cont Pilates , stabiization, core, scap and hips. Re-tape deltoid and include an I strip across the Y    PT [place] and Agree with Plan of Care Patient           Patient will benefit from skilled therapeutic intervention in order to improve the following deficits and impairments:  Decreased activity tolerance,Decreased strength,Decreased balance,Pain,Decreased endurance,Decreased coordination,Impaired UE functional use,Hypermobility,Decreased knowledge of precautions  Visit Diagnosis: Pain in left hip  Chronic right shoulder pain  Polyarthralgia  Hypermobility syndrome  Muscle weakness  (generalized)     Problem List Patient Active Problem List   Diagnosis Date Noted  . Hypermobility syndrome 11/27/2020  . Seasonal and perennial allergic rhinitis 11/06/2018  .  Mild intermittent asthma, uncomplicated 60/63/0160  . Flexural atopic dermatitis 11/06/2018  . History of hyperlipidemia 11/08/2016  . History of anxiety 11/08/2016  . History of retinal tear 11/08/2016  . History of Bilateral plantar fasciitis 08/10/2016  . High risk medication use 07/04/2016  . Lupus ([place]) 05/23/2016  . 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 ADHD (attention deficit hyperactivity disorder) 12/22/2014  . Raynaud's phenomenon 10/17/2013  . Hyperlipidemia 09/28/2011    Robin [name] 01/20/2021, 11:13 AM  [facility] [address] [place], [place], [address] Phone: [phone]   Fax:  [phone]  Name: Robin [name] MRN: [id number] Date of Birth: 06-Dec-1970  Raeford Razor, PT 01/20/21 11:13 AM Phone: [phone] Fax: [phone]

## 2021-01-21 DIAGNOSIS — F419 Anxiety disorder, unspecified: Secondary | ICD-10-CM | POA: Diagnosis not present

## 2021-01-21 DIAGNOSIS — F902 Attention-deficit hyperactivity disorder, combined type: Secondary | ICD-10-CM | POA: Diagnosis not present

## 2021-01-21 DIAGNOSIS — Z79899 Other long term (current) drug therapy: Secondary | ICD-10-CM | POA: Diagnosis not present

## 2021-01-22 DIAGNOSIS — R41840 Attention and concentration deficit: Secondary | ICD-10-CM | POA: Diagnosis not present

## 2021-01-26 ENCOUNTER — Ambulatory Visit (INDEPENDENT_AMBULATORY_CARE_PROVIDER_SITE_OTHER): Payer: BC Managed Care – PPO | Admitting: Rheumatology

## 2021-01-26 ENCOUNTER — Encounter: Payer: Self-pay | Admitting: Rheumatology

## 2021-01-26 ENCOUNTER — Other Ambulatory Visit: Payer: Self-pay

## 2021-01-26 VITALS — BP 122/81 | HR 69 | Ht 66.0 in | Wt 157.2 lb

## 2021-01-26 DIAGNOSIS — F900 Attention-deficit hyperactivity disorder, predominantly inattentive type: Secondary | ICD-10-CM

## 2021-01-26 DIAGNOSIS — Z79899 Other long term (current) drug therapy: Secondary | ICD-10-CM | POA: Diagnosis not present

## 2021-01-26 DIAGNOSIS — I7300 Raynaud's syndrome without gangrene: Secondary | ICD-10-CM

## 2021-01-26 DIAGNOSIS — R5383 Other fatigue: Secondary | ICD-10-CM

## 2021-01-26 DIAGNOSIS — Z8639 Personal history of other endocrine, nutritional and metabolic disease: Secondary | ICD-10-CM

## 2021-01-26 DIAGNOSIS — Z8669 Personal history of other diseases of the nervous system and sense organs: Secondary | ICD-10-CM

## 2021-01-26 DIAGNOSIS — M722 Plantar fascial fibromatosis: Secondary | ICD-10-CM

## 2021-01-26 DIAGNOSIS — M3219 Other organ or system involvement in systemic lupus erythematosus: Secondary | ICD-10-CM

## 2021-01-26 DIAGNOSIS — Z8659 Personal history of other mental and behavioral disorders: Secondary | ICD-10-CM

## 2021-01-26 DIAGNOSIS — K5000 Crohn's disease of small intestine without complications: Secondary | ICD-10-CM

## 2021-01-26 DIAGNOSIS — L52 Erythema nodosum: Secondary | ICD-10-CM | POA: Diagnosis not present

## 2021-01-26 DIAGNOSIS — R748 Abnormal levels of other serum enzymes: Secondary | ICD-10-CM | POA: Diagnosis not present

## 2021-01-26 DIAGNOSIS — N924 Excessive bleeding in the premenopausal period: Secondary | ICD-10-CM

## 2021-01-26 DIAGNOSIS — M25552 Pain in left hip: Secondary | ICD-10-CM

## 2021-01-26 DIAGNOSIS — M329 Systemic lupus erythematosus, unspecified: Secondary | ICD-10-CM

## 2021-01-26 DIAGNOSIS — M25511 Pain in right shoulder: Secondary | ICD-10-CM

## 2021-01-26 DIAGNOSIS — Z9889 Other specified postprocedural states: Secondary | ICD-10-CM

## 2021-01-26 DIAGNOSIS — G8929 Other chronic pain: Secondary | ICD-10-CM

## 2021-01-26 NOTE — Patient Instructions (Signed)
Standing Labs We placed an order today for your standing lab work.   Please have your standing labs drawn in October  If possible, please have your labs drawn 2 weeks prior to your appointment so that the provider can discuss your results at your appointment.  We have open lab daily Monday through Thursday from 1:30-4:30 PM and Friday from 1:30-4:00 PM at the office of Dr. Bo Merino, [facility].   Please be advised, all patients with office appointments requiring lab work will take precedents over walk-in lab work.  If possible, please come for your lab work on Monday and Friday afternoons, as you may experience shorter wait times. The office is located at [address] [place], [place], [place] [address] No appointment is necessary.   Labs are drawn by [facility]. Please bring your co-pay at the time of your lab draw.  You may receive a bill from [facility] for your lab work.  If you wish to have your labs drawn at another location, please call the office 24 hours in advance to send orders.  If you have any questions regarding directions or hours of operation,  please call [phone].   As a reminder, please drink plenty of water prior to coming for your lab work. Thanks!   Heart Disease Prevention   Your inflammatory disease increases your risk of heart disease which includes heart attack, stroke, atrial fibrillation (irregular heartbeats), high blood pressure, heart failure and atherosclerosis (plaque in the arteries).  It is important to reduce your risk by:   . Keep blood pressure, cholesterol, and blood sugar at healthy levels   . Smoking Cessation   . Maintain a healthy weight  o BMI 20-25   . Eat a healthy diet  o Plenty of fresh fruit, vegetables, and whole grains  o Limit saturated fats, foods high in sodium, and added sugars  o DASH and Mediterranean diet   . Increase physical activity  o Recommend moderate physically activity for 150 minutes per  week/ 30 minutes a day for five days a week These can be broken up into three separate ten-minute sessions during the day.   . Reduce Stress  . Meditation, slow breathing exercises, yoga, coloring books  . Dental visits twice a year

## 2021-01-27 ENCOUNTER — Ambulatory Visit (INDEPENDENT_AMBULATORY_CARE_PROVIDER_SITE_OTHER): Payer: BC Managed Care – PPO

## 2021-01-27 ENCOUNTER — Ambulatory Visit: Payer: BC Managed Care – PPO | Admitting: Physical Therapy

## 2021-01-27 ENCOUNTER — Other Ambulatory Visit: Payer: Self-pay

## 2021-01-27 ENCOUNTER — Encounter: Payer: Self-pay | Admitting: Physical Therapy

## 2021-01-27 DIAGNOSIS — M357 Hypermobility syndrome: Secondary | ICD-10-CM

## 2021-01-27 DIAGNOSIS — M25552 Pain in left hip: Secondary | ICD-10-CM

## 2021-01-27 DIAGNOSIS — G8929 Other chronic pain: Secondary | ICD-10-CM | POA: Diagnosis not present

## 2021-01-27 DIAGNOSIS — M6281 Muscle weakness (generalized): Secondary | ICD-10-CM | POA: Diagnosis not present

## 2021-01-27 DIAGNOSIS — M2550 Pain in unspecified joint: Secondary | ICD-10-CM

## 2021-01-27 DIAGNOSIS — J309 Allergic rhinitis, unspecified: Secondary | ICD-10-CM | POA: Diagnosis not present

## 2021-01-27 DIAGNOSIS — M25511 Pain in right shoulder: Secondary | ICD-10-CM | POA: Diagnosis not present

## 2021-01-27 NOTE — Progress Notes (Signed)
CBC is normal, CMP is normal alkaline phosphatase is still low.  UA is negative, complements are normal, sed rate is normal, phosphorus is normal, anti-DNA is pending.  Please add hydroxychloroquine level if possible

## 2021-01-27 NOTE — Therapy (Signed)
[place], [place], [address] Phone: [phone]   Fax:  [phone]  Physical Therapy Treatment  Patient Details  Name: Robin [name] MRN: [id number] Date of Birth: May 08, 1971 Referring Provider (PT): Dr. Clearance Coots   Encounter Date: 01/27/2021   PT End of Session - 01/27/21 0930    Visit Number 6    Number of Visits 8    Date for PT Re-Evaluation 02/17/21    Authorization Type BCBS    PT Start Time 0920    PT Stop Time 1002    PT Time Calculation (min) 42 min    Activity Tolerance Patient tolerated treatment well    Behavior During Therapy [facility] for tasks assessed/performed           Past Medical History:  Diagnosis Date  . ADD (attention deficit disorder)   . Allergy   . Anemia   . Anxiety   . Asthma   . Depression   . Hyperlipidemia   . Ileitis   . Lupus ([place])   . PONV (postoperative nausea and vomiting)    mild nausea   . Raynaud disease     Past Surgical History:  Procedure Laterality Date  . CARPAL TUNNEL RELEASE Right    08/2019  . CESAREAN SECTION     x 2  . DENTAL SURGERY    . DILITATION & CURRETTAGE/HYSTROSCOPY WITH NOVASURE ABLATION N/A 06/03/2020   Procedure: DILATATION & CURETTAGE/HYSTEROSCOPY WITH NOVASURE ABLATION;  Surgeon: Aletha Halim, MD;  Location: [employer] [facility];  Service: Gynecology;  Laterality: N/A;  . ENDOMETRIAL BIOPSY  04/20/2020      . EYE SURGERY Bilateral    cryoretinopexy    There were no vitals filed for this visit.   Subjective Assessment - 01/27/21 0920    Subjective I have to stop taking the steriod.  I can tell i stopped last night my L hip and SIJ is hurting more. The tape really helped, I didnt feel the knot in my Rt shoulder like I usually do.    Currently in Pain? Yes    Pain Score 5     Pain Location Hip    Pain Orientation Left    Pain Descriptors / Indicators Aching;Tightness;Sore   stiffness   Pain Type Chronic  pain    Pain Radiating Towards ant and post hip    Pain Onset More than a month ago    Pain Frequency Intermittent    Aggravating Factors  varies    Pain Relieving Factors meds, PT    Effect of Pain on Daily Activities fatigued , feels "off"             [facility] Adult PT Treatment/Exercise - 01/27/21 0001      Lumbar Exercises: Stretches   Lower Trunk Rotation 10 seconds    Lower Trunk Rotation Limitations x 10 end of session      Lumbar Exercises: Supine   Bridge 15 reps    Bridge Limitations blue band      Knee/Hip Exercises: Stretches   Piriformis Stretch Both;5 reps    Other Knee/Hip Stretches hip ER/IR x 10      Knee/Hip Exercises: Standing   Abduction Limitations blue band x 12 no UE support    Lateral Step Up Both;15 reps;Hand Hold: 1;Hand Hold: 0;Step Height: 8"    Forward Step Up Both;Hand Hold: 0;Hand Hold: 1;Step Height: 8"    Functional Squat Limitations blue band x 15 worked on  weight shift      Knee/Hip Exercises: Seated   Sit to Sand 10 reps;without UE support   2 sets, single leg, used 10 lbs wt, opp heel on floor     Manual Therapy   Manual Therapy Taping    Kinesiotex Inhibit Muscle;Ligament Correction      Kinesiotix   Inhibit Muscle  deltoid Y ant and post with stretch, 25%    Ligament Correction 1 "I " strip lateral 50% stretch across lateral deltoid                    PT Short Term Goals - 01/15/21 1020      PT SHORT TERM GOAL #1   Title be independent in initial HEP for neutral spinal alignment and core.    Status Achieved      PT SHORT TERM GOAL #2   Title Pt will be mindful of neutral spine and setting shoulder prior to lifting, reaching    Status Achieved      PT SHORT TERM GOAL #3   Title Pt will understand FOTO score and potential to improve.    Status Achieved             PT Long Term Goals - 01/15/21 1020      PT LONG TERM GOAL #1   Title ind with advanced HEP    Status On-going      PT LONG TERM GOAL #2    Title FOTO score will improve to 60 % or more able    Status On-going      PT LONG TERM GOAL #3   Title Pt will be able to walk 3 -5 miles with pain and discomfort in back, LEs < 2/10.    Status On-going      PT LONG TERM GOAL #4   Title report < or = to 2-3/10 Rt shoulder pain with cleaning, lifting and carrying    Status On-going      PT LONG TERM GOAL #5   Title Pt will be able to particiapte in full body conditioning program with min increase in pain from baseline that resolves with rest within 2 hours    Status On-going                 Plan - 01/27/21 0951    Clinical Impression Statement Worked more on standing hip and knee exercises for stability, alignment.  Able to do some higher level exercises without increasing pain, comparing Rt to Lt LE with respect to control and fatigue.  L hip and SIJ felt less stable with single leg work. Re-taped shoulder and emailed her the you tube link for home use.    PT Treatment/Interventions ADLs/Self Care Home Management;Cryotherapy;Electrical Stimulation;Moist Heat;Stair training;Functional mobility training;Therapeutic activities;Therapeutic exercise;Balance training;Neuromuscular re-education;Manual techniques;Patient/family education;Passive range of motion;Dry needling;Joint Manipulations;Taping;Other (comment)    PT Next Visit Plan ?FOTO cont Pilates , stabiization, core, scap and hips. Re-tape deltoid and include an I strip across the Y    PT Home Exercise Plan [id number]    Consulted and Agree with Plan of Care Patient           Patient will benefit from skilled therapeutic intervention in order to improve the following deficits and impairments:  Decreased activity tolerance,Decreased strength,Decreased balance,Pain,Decreased endurance,Decreased coordination,Impaired UE functional use,Hypermobility,Decreased knowledge of precautions  Visit Diagnosis: Pain in left hip  Chronic right shoulder  pain  Polyarthralgia  Hypermobility syndrome  Muscle weakness (generalized)  Problem List Patient Active Problem List   Diagnosis Date Noted  . Hypermobility syndrome 11/27/2020  . Seasonal and perennial allergic rhinitis 11/06/2018  . Mild intermittent asthma, uncomplicated 39/35/9409  . Flexural atopic dermatitis 11/06/2018  . History of hyperlipidemia 11/08/2016  . History of anxiety 11/08/2016  . History of retinal tear 11/08/2016  . History of Bilateral plantar fasciitis 08/10/2016  . High risk medication use 07/04/2016  . Lupus ([place]) 05/23/2016  . 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 ADHD (attention deficit hyperactivity disorder) 12/22/2014  . Raynaud's phenomenon 10/17/2013  . Hyperlipidemia 09/28/2011    Kristena Wilhelmi 01/27/2021, 1:54 PM  [facility] [address] [place], [place], [address] Phone: [phone]   Fax:  [phone]  Name: Robin [name] MRN: [id number] Date of Birth: 20-May-1971  Raeford Razor, PT 01/27/21 1:54 PM Phone: [phone] Fax: [phone]

## 2021-02-03 ENCOUNTER — Ambulatory Visit: Payer: BC Managed Care – PPO | Admitting: Physical Therapy

## 2021-02-03 LAB — COMPLETE METABOLIC PANEL WITH GFR
AG Ratio: 1.8 (calc) (ref 1.0–2.5)
ALT: 14 U/L (ref 6–29)
AST: 18 U/L (ref 10–35)
Albumin: 4.8 g/dL (ref 3.6–5.1)
Alkaline phosphatase (APISO): 26 U/L — ABNORMAL LOW (ref 31–125)
BUN: 15 mg/dL (ref 7–25)
CO2: 27 mmol/L (ref 20–32)
Calcium: 10.1 mg/dL (ref 8.6–10.2)
Chloride: 101 mmol/L (ref 98–110)
Creat: 0.86 mg/dL (ref 0.50–1.10)
GFR, Est African American: 93 mL/min/{1.73_m2} (ref >=60)
GFR, Est Non African American: 80 mL/min/{1.73_m2} (ref >=60)
Globulin: 2.6 g/dL (calc) (ref 1.9–3.7)
Glucose, Bld: 87 mg/dL (ref 65–99)
Potassium: 4.5 mmol/L (ref 3.5–5.3)
Sodium: 137 mmol/L (ref 135–146)
Total Bilirubin: 0.6 mg/dL (ref 0.2–1.2)
Total Protein: 7.4 g/dL (ref 6.1–8.1)

## 2021-02-03 LAB — CBC WITH DIFFERENTIAL/PLATELET
Absolute Monocytes: 451 cells/uL (ref 200–950)
Basophils Absolute: 21 cells/uL (ref 0–200)
Basophils Relative: 0.4 %
Eosinophils Absolute: 42 cells/uL (ref 15–500)
Eosinophils Relative: 0.8 %
HCT: 44.2 % (ref 35.0–45.0)
Hemoglobin: 14.5 g/dL (ref 11.7–15.5)
Lymphs Abs: 1574 cells/uL (ref 850–3900)
MCH: 31.4 pg (ref 27.0–33.0)
MCHC: 32.8 g/dL (ref 32.0–36.0)
MCV: 95.7 fL (ref 80.0–100.0)
MPV: 9.9 fL (ref 7.5–12.5)
Monocytes Relative: 8.5 %
Neutro Abs: 3212 cells/uL (ref 1500–7800)
Neutrophils Relative %: 60.6 %
Platelets: 290 10*3/uL (ref 140–400)
RBC: 4.62 10*6/uL (ref 3.80–5.10)
RDW: 13.2 % (ref 11.0–15.0)
Total Lymphocyte: 29.7 %
WBC: 5.3 10*3/uL (ref 3.8–10.8)

## 2021-02-03 LAB — URINALYSIS, ROUTINE W REFLEX MICROSCOPIC
Bilirubin Urine: NEGATIVE
Glucose, UA: NEGATIVE
Hgb urine dipstick: NEGATIVE
Ketones, ur: NEGATIVE
Leukocytes,Ua: NEGATIVE
Nitrite: NEGATIVE
Protein, ur: NEGATIVE
Specific Gravity, Urine: 1.007 (ref 1.001–1.035)
pH: 7.5 (ref 5.0–8.0)

## 2021-02-03 LAB — ANTI-DNA ANTIBODY, DOUBLE-STRANDED: ds DNA Ab: 1 IU/mL

## 2021-02-03 LAB — C3 AND C4
C3 Complement: 89 mg/dL (ref 83–193)
C4 Complement: 22 mg/dL (ref 15–57)

## 2021-02-03 LAB — TEST AUTHORIZATION

## 2021-02-03 LAB — SEDIMENTATION RATE: Sed Rate: 2 mm/h (ref 0–20)

## 2021-02-03 LAB — HYDROXYCHLOROQUINE, SERUM/PLASMA: HYDROXYCHLOROQUINE, S/P: 260 ng/mL

## 2021-02-03 LAB — PHOSPHORUS: Phosphorus: 4.1 mg/dL (ref 2.5–4.5)

## 2021-02-03 NOTE — Progress Notes (Signed)
Hydroxychloroquine level is low.  Please advise patient to take hydroxychloroquine on a regular basis.

## 2021-02-10 ENCOUNTER — Other Ambulatory Visit: Payer: Self-pay | Admitting: *Deleted

## 2021-02-10 ENCOUNTER — Other Ambulatory Visit: Payer: Self-pay

## 2021-02-10 ENCOUNTER — Ambulatory Visit: Payer: BC Managed Care – PPO | Attending: Physician Assistant | Admitting: Physical Therapy

## 2021-02-10 DIAGNOSIS — M25511 Pain in right shoulder: Secondary | ICD-10-CM | POA: Insufficient documentation

## 2021-02-10 DIAGNOSIS — M357 Hypermobility syndrome: Secondary | ICD-10-CM | POA: Diagnosis not present

## 2021-02-10 DIAGNOSIS — M2550 Pain in unspecified joint: Secondary | ICD-10-CM | POA: Insufficient documentation

## 2021-02-10 DIAGNOSIS — M6281 Muscle weakness (generalized): Secondary | ICD-10-CM | POA: Insufficient documentation

## 2021-02-10 DIAGNOSIS — G8929 Other chronic pain: Secondary | ICD-10-CM | POA: Insufficient documentation

## 2021-02-10 DIAGNOSIS — M25552 Pain in left hip: Secondary | ICD-10-CM | POA: Diagnosis not present

## 2021-02-10 DIAGNOSIS — M3219 Other organ or system involvement in systemic lupus erythematosus: Secondary | ICD-10-CM

## 2021-02-10 MED ORDER — HYDROXYCHLOROQUINE SULFATE 200 MG PO TABS: ORAL_TABLET | ORAL | 0 refills | Status: DC

## 2021-02-10 NOTE — Therapy (Signed)
[place], [place], [address] Phone: [phone]   Fax:  [phone]  Physical Therapy Treatment/Re-Evaluation  Patient Details  Name: Robin [name] MRN: [id number] Date of Birth: 01/22/1971 Referring Provider (PT): Dr. Clearance Coots   Encounter Date: 02/10/2021   PT End of Session - 02/10/21 0924    Visit Number 7    Number of Visits 12    Date for PT Re-Evaluation 04/07/21    Authorization Type BCBS    PT Start Time [phone]    PT Stop Time 1000    PT Time Calculation (min) 37 min    Activity Tolerance Patient tolerated treatment well    Behavior During Therapy [facility] for tasks assessed/performed           Past Medical History:  Diagnosis Date  . ADD (attention deficit disorder)   . Allergy   . Anemia   . Anxiety   . Asthma   . Depression   . Hyperlipidemia   . Ileitis   . Lupus ([place])   . PONV (postoperative nausea and vomiting)    mild nausea   . Raynaud disease     Past Surgical History:  Procedure Laterality Date  . CARPAL TUNNEL RELEASE Right    08/2019  . CESAREAN SECTION     x 2  . DENTAL SURGERY    . DILITATION & CURRETTAGE/HYSTROSCOPY WITH NOVASURE ABLATION N/A 06/03/2020   Procedure: DILATATION & CURETTAGE/HYSTEROSCOPY WITH NOVASURE ABLATION;  Surgeon: Aletha Halim, MD;  Location: [facility];  Service: Gynecology;  Laterality: N/A;  . ENDOMETRIAL BIOPSY  04/20/2020      . EYE SURGERY Bilateral    cryoretinopexy    There were no vitals filed for this visit.   Subjective Assessment - 02/10/21 0925    Subjective I was so incredibly sore after last visit.  Did miss a visit due to [place] exposure.  Could I do every 2 weeks? Still want to continue .  My baseline stability and balance are stronger than baseline.  this is keeping me moving forward. Rt Arm is painful, hand because I am working at home and using a different computer.    Currently in Pain? Yes    Pain  Score 5     Pain Location Arm   head, neck and arm and hand   Pain Orientation Right    Pain Descriptors / Indicators Sore;Tightness    Pain Onset More than a month ago    Pain Frequency Intermittent    Aggravating Factors  malposition    Pain Relieving Factors meds, PT    Pain Score 0    Pain Location Hip              [facility] PT Assessment - 02/10/21 0001      AROM   Lumbar Flexion palms flat    Lumbar Extension WFL    Lumbar - Right Side Bend [facility]    Lumbar - Left Side Bend WFL    Lumbar - Right Rotation [facility]    Lumbar - Left Rotation [facility]      Strength   Right Hip Flexion 4+/5    Right Hip Extension 4+/5    Right Hip ABduction 5/5    Right Hip ADduction 4+/5    Left Hip Flexion 4+/5   pain   Left Hip Extension 4+/5    Left Hip External Rotation 5/5    Left Hip Internal Rotation 5/5  Left Hip ABduction 5/5    Left Hip ADduction 4+/5             [facility] Adult PT Treatment/Exercise - 02/10/21 0001      Pilates   Pilates Reformer see note            Pilates Reformer used for LE/core strength, postural strength, lumbopelvic disassociation and core control.  Exercises included:  Feet in Straps 1 Red   Sidelying hip abduction with straight leg hip flexion and extension x15  Hip flexion/ext x 15  Hip circles x 10 each  Noted core instability and decreased muscle endurance in bilateral glutes, lateral hip.      PT Short Term Goals - 01/15/21 1020      PT SHORT TERM GOAL #1   Title be independent in initial HEP for neutral spinal alignment and core.    Status Achieved      PT SHORT TERM GOAL #2   Title Pt will be mindful of neutral spine and setting shoulder prior to lifting, reaching    Status Achieved      PT SHORT TERM GOAL #3   Title Pt will understand FOTO score and potential to improve.    Status Achieved             PT Long Term Goals - 02/10/21 0930      PT LONG TERM GOAL #1   Title ind with advanced HEP    Status On-going      PT LONG  TERM GOAL #2   Title FOTO score will improve to 60 % or more able    Baseline 46% improved from 41%    Status On-going      PT LONG TERM GOAL #3   Title Pt will be able to walk 3 -5 miles with pain and discomfort in back, LEs < 2/10.    Baseline 1 mile and was out about 30 min , has not gone beyond 3 miles much.  the main thing is my feet and soreness in legs.    Status On-going      PT LONG TERM GOAL #4   Title report < or = to 2-3/10 Rt shoulder pain with cleaning, lifting and carrying    Baseline up to 6/10    Status On-going      PT LONG TERM GOAL #5   Title Pt will be able to particiapte in full body conditioning program with min increase in pain from baseline that resolves with rest within 2 hours    Status On-going                 Plan - 02/10/21 0933    Clinical Impression Statement Pt will continue to benefit from skilled PT to increase stability in shoulder girdle and lumbopelvic region.  She has good muscle tone, strength but decreased endurance.  She has developed better body awareness and position sense.  She will cont 1 x every 2 weeks for maintaining her HEP and begin intergrating into a community program (Pilates).    Personal Factors and Comorbidities Comorbidity 2;Time since onset of injury/illness/exacerbation    Comorbidities lupus, hypermobility, Rt CTS release    Examination-Activity Limitations Carry;Bed Mobility;Lift;Stand;Stairs;Squat;Sleep;Reach Overhead    Examination-Participation Restrictions Community Activity;Laundry;Shop;Occupation    Stability/Clinical Decision Making Evolving/Moderate complexity    Rehab Potential Excellent    PT Frequency Biweekly    PT Duration 8 weeks    PT Treatment/Interventions ADLs/Self Care Home Management;Cryotherapy;Electrical Stimulation;Moist Heat;Stair training;Functional mobility  training;Therapeutic activities;Therapeutic exercise;Balance training;Neuromuscular re-education;Manual techniques;Patient/family  education;Passive range of motion;Dry needling;Joint Manipulations;Taping;Other (comment)    PT Next Visit Plan cont Pilates , stabiization, core, scap and hips. Re-tape deltoid and include an I strip across the Y    PT [place] and Agree with Plan of Care Patient           Patient will benefit from skilled therapeutic intervention in order to improve the following deficits and impairments:  Decreased activity tolerance,Decreased strength,Decreased balance,Pain,Decreased endurance,Decreased coordination,Impaired UE functional use,Hypermobility,Decreased knowledge of precautions  Visit Diagnosis: Pain in left hip  Chronic right shoulder pain  Polyarthralgia  Hypermobility syndrome  Muscle weakness (generalized)     Problem List Patient Active Problem List   Diagnosis Date Noted  . Hypermobility syndrome 11/27/2020  . Seasonal and perennial allergic rhinitis 11/06/2018  . Mild intermittent asthma, uncomplicated 42/70/6237  . Flexural atopic dermatitis 11/06/2018  . History of hyperlipidemia 11/08/2016  . History of anxiety 11/08/2016  . History of retinal tear 11/08/2016  . History of Bilateral plantar fasciitis 08/10/2016  . High risk medication use 07/04/2016  . Lupus ([place]) 05/23/2016  . 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 ADHD (attention deficit hyperactivity disorder) 12/22/2014  . Raynaud's phenomenon 10/17/2013  . Hyperlipidemia 09/28/2011    Robin [name] 02/10/2021, 10:47 AM  [facility] [address] [place], [place], [address] Phone: [phone]   Fax:  [phone]  Name: Robin [name] MRN: [id number] Date of Birth: 15-Sep-1970  Raeford Razor, PT 02/10/21 10:47 AM Phone: [phone] Fax: [phone]

## 2021-02-10 NOTE — Telephone Encounter (Signed)
Refill request received via fax  Last Visit: 01/26/2021 Next Visit: 04/28/2021 Labs: 01/26/2021 CBC is normal, CMP is normal alkaline phosphatase is still low Eye exam: 05/29/2020 WNL   Current Dose per office note 01/26/2021: Plaquenil 200 mg 1 tablet by mouth twice daily M-F DX: Other systemic lupus erythematosus with other organ involvement   Last Fill: 10/14/2020  Okay to refill per Dr. Estanislado Pandy

## 2021-02-16 DIAGNOSIS — F902 Attention-deficit hyperactivity disorder, combined type: Secondary | ICD-10-CM | POA: Diagnosis not present

## 2021-02-16 DIAGNOSIS — F411 Generalized anxiety disorder: Secondary | ICD-10-CM | POA: Diagnosis not present

## 2021-02-16 DIAGNOSIS — F339 Major depressive disorder, recurrent, unspecified: Secondary | ICD-10-CM | POA: Diagnosis not present

## 2021-02-22 ENCOUNTER — Encounter: Payer: Self-pay | Admitting: Allergy & Immunology

## 2021-02-24 ENCOUNTER — Ambulatory Visit: Payer: BC Managed Care – PPO | Admitting: Physical Therapy

## 2021-02-25 ENCOUNTER — Ambulatory Visit (INDEPENDENT_AMBULATORY_CARE_PROVIDER_SITE_OTHER): Payer: BC Managed Care – PPO | Admitting: *Deleted

## 2021-02-25 DIAGNOSIS — J309 Allergic rhinitis, unspecified: Secondary | ICD-10-CM | POA: Diagnosis not present

## 2021-03-05 ENCOUNTER — Ambulatory Visit (INDEPENDENT_AMBULATORY_CARE_PROVIDER_SITE_OTHER): Payer: BC Managed Care – PPO

## 2021-03-05 DIAGNOSIS — J309 Allergic rhinitis, unspecified: Secondary | ICD-10-CM | POA: Diagnosis not present

## 2021-03-10 ENCOUNTER — Encounter: Payer: Self-pay | Admitting: Physical Therapy

## 2021-03-10 ENCOUNTER — Other Ambulatory Visit: Payer: Self-pay

## 2021-03-10 ENCOUNTER — Ambulatory Visit: Payer: BC Managed Care – PPO | Admitting: Physical Therapy

## 2021-03-10 DIAGNOSIS — G8929 Other chronic pain: Secondary | ICD-10-CM

## 2021-03-10 DIAGNOSIS — M6281 Muscle weakness (generalized): Secondary | ICD-10-CM | POA: Diagnosis not present

## 2021-03-10 DIAGNOSIS — M357 Hypermobility syndrome: Secondary | ICD-10-CM

## 2021-03-10 DIAGNOSIS — M25511 Pain in right shoulder: Secondary | ICD-10-CM | POA: Diagnosis not present

## 2021-03-10 DIAGNOSIS — M25552 Pain in left hip: Secondary | ICD-10-CM | POA: Diagnosis not present

## 2021-03-10 DIAGNOSIS — M2550 Pain in unspecified joint: Secondary | ICD-10-CM

## 2021-03-10 NOTE — Therapy (Signed)
[place], [place], [address] Phone: [phone]   Fax:  [phone]  Physical Therapy Treatment  Patient Details  Name: Robin [name] MRN: [id number] Date of Birth: 1970-12-05 Referring Provider (PT): Dr. Clearance [name]   Encounter Date: 03/10/2021   PT End of Session - 03/10/21 1558     Visit Number 8    Number of Visits 12    Date for PT Re-Evaluation 04/07/21    Authorization Type BCBS    PT Start Time 0830    PT Stop Time 0913    PT Time Calculation (min) 43 min    Activity Tolerance Patient tolerated treatment well    Behavior During Therapy [facility] for tasks assessed/performed             Past Medical History:  Diagnosis Date   ADD (attention deficit disorder)    Allergy    Anemia    Anxiety    Asthma    Depression    Hyperlipidemia    Ileitis    Lupus ([facility])    PONV (postoperative nausea and vomiting)    mild nausea    Raynaud disease     Past Surgical History:  Procedure Laterality Date   CARPAL TUNNEL RELEASE Right    08/2019   CESAREAN SECTION     x 2   DENTAL SURGERY     DILITATION & CURRETTAGE/HYSTROSCOPY WITH NOVASURE ABLATION N/A 06/03/2020   Procedure: DILATATION & CURETTAGE/HYSTEROSCOPY WITH NOVASURE ABLATION;  Surgeon: Robin Halim, MD;  Location: [facility];  Service: Gynecology;  Laterality: N/A;   ENDOMETRIAL BIOPSY  04/20/2020       EYE SURGERY Bilateral    cryoretinopexy    There were no vitals filed for this visit.   Subjective Assessment - 03/10/21 0834     Subjective The tape really helped.  Allergies have been bad.  Shoulder has mostly calmed down.  Feet are still painful.  Sees Rheum in 2 weeks. HIp has been feeling good.  She has not been consistent with HEP but has still been really active.                [facility] Adult PT Treatment/Exercise - 03/10/21 0001       Pilates   Pilates Reformer see note      Manual Therapy    Manual Therapy Taping    Kinesiotex Inhibit Muscle;Ligament Correction      Kinesiotix   Inhibit Muscle  deltoid Y ant and post with stretch, 25%    Ligament Correction 1 "I " strip lateral 50% stretch across lateral deltoid             Pilates Reformer used for LE/core strength, postural strength, lumbopelvic disassociation and core control.  Exercises included:  Supine Arm work 1 Red 1 Yellow  Arm arcs x 10 parallel and T x10   Supine Abs hold with alternating bent knee scissor x 10    Long box Prone  1 red overhead press    Double  arm x 15 and single arm x 10   Seated Arms   Roll down red spring x 10   Row x 10 in partial roll down   Hinge and biceps x 10   Tall kneeling arms blue x 15 , external rotation           PT Short Term Goals - 01/15/21 1020       PT SHORT TERM GOAL #1  Title be independent in initial HEP for neutral spinal alignment and core.    Status Achieved      PT SHORT TERM GOAL #2   Title Pt will be mindful of neutral spine and setting shoulder prior to lifting, reaching    Status Achieved      PT SHORT TERM GOAL #3   Title Pt will understand FOTO score and potential to improve.    Status Achieved               PT Long Term Goals - 02/10/21 0930       PT LONG TERM GOAL #1   Title ind with advanced HEP    Status On-going      PT LONG TERM GOAL #2   Title FOTO score will improve to 60 % or more able    Baseline 46% improved from 41%    Status On-going      PT LONG TERM GOAL #3   Title Pt will be able to walk 3 -5 miles with pain and discomfort in back, LEs < 2/10.    Baseline 1 mile and was out about 30 min , has not gone beyond 3 miles much.  the main thing is my feet and soreness in legs.    Status On-going      PT LONG TERM GOAL #4   Title report < or = to 2-3/10 Rt shoulder pain with cleaning, lifting and carrying    Baseline up to 6/10    Status On-going      PT LONG TERM GOAL #5   Title Pt will be able to  particiapte in full body conditioning program with min increase in pain from baseline that resolves with rest within 2 hours    Status On-going                   Plan - 03/10/21 1450     Clinical Impression Statement Patient missed her last appt due to allergies and being cautious regarding potential COVID.  She has been able to maintain her mobility quite well, has controlled her hip pain . She has a mild degree of shoulder pain and foot pain .  She will benefit from cont PT to re-establish consisency with HEP and help with transition to community exercises.    PT Treatment/Interventions ADLs/Self Care Home Management;Cryotherapy;Electrical Stimulation;Moist Heat;Stair training;Functional mobility training;Therapeutic activities;Therapeutic exercise;Balance training;Neuromuscular re-education;Manual techniques;Patient/family education;Passive range of motion;Dry needling;Joint Manipulations;Taping;Other (comment)    PT Next Visit Plan cont Pilates , stabiization, core, scap and hips. Re-tape deltoid and include an I strip across the [place] and Agree with Plan of Care Patient             Patient will benefit from skilled therapeutic intervention in order to improve the following deficits and impairments:  Decreased activity tolerance, Decreased strength, Decreased balance, Pain, Decreased endurance, Decreased coordination, Impaired UE functional use, Hypermobility, Decreased knowledge of precautions  Visit Diagnosis: Pain in left hip  Chronic right shoulder pain  Polyarthralgia  Hypermobility syndrome  Muscle weakness (generalized)     Problem List Patient Active Problem List   Diagnosis Date Noted   Hypermobility syndrome 11/27/2020   Seasonal and perennial allergic rhinitis 11/06/2018   Mild intermittent asthma, uncomplicated 51/10/5850   Flexural atopic dermatitis 11/06/2018   History of hyperlipidemia 11/08/2016    History of anxiety 11/08/2016   History of retinal tear 11/08/2016  History of Bilateral plantar fasciitis 08/10/2016   High risk medication use 07/04/2016   Lupus ([place]) 05/23/2016   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

## 2021-03-11 ENCOUNTER — Ambulatory Visit (INDEPENDENT_AMBULATORY_CARE_PROVIDER_SITE_OTHER): Payer: BC Managed Care – PPO

## 2021-03-11 DIAGNOSIS — J309 Allergic rhinitis, unspecified: Secondary | ICD-10-CM | POA: Diagnosis not present

## 2021-03-12 DIAGNOSIS — J0100 Acute maxillary sinusitis, unspecified: Secondary | ICD-10-CM | POA: Diagnosis not present

## 2021-03-14 DIAGNOSIS — Z20828 Contact with and (suspected) exposure to other viral communicable diseases: Secondary | ICD-10-CM | POA: Diagnosis not present

## 2021-03-16 DIAGNOSIS — R52 Pain, unspecified: Secondary | ICD-10-CM | POA: Diagnosis not present

## 2021-03-16 DIAGNOSIS — R5383 Other fatigue: Secondary | ICD-10-CM | POA: Diagnosis not present

## 2021-03-16 DIAGNOSIS — L989 Disorder of the skin and subcutaneous tissue, unspecified: Secondary | ICD-10-CM | POA: Diagnosis not present

## 2021-03-17 ENCOUNTER — Encounter: Payer: Self-pay | Admitting: Rheumatology

## 2021-03-18 DIAGNOSIS — Z20828 Contact with and (suspected) exposure to other viral communicable diseases: Secondary | ICD-10-CM | POA: Diagnosis not present

## 2021-03-19 ENCOUNTER — Other Ambulatory Visit: Payer: Self-pay | Admitting: *Deleted

## 2021-03-19 ENCOUNTER — Telehealth: Payer: Self-pay | Admitting: Allergy & Immunology

## 2021-03-19 DIAGNOSIS — J069 Acute upper respiratory infection, unspecified: Secondary | ICD-10-CM

## 2021-03-19 MED ORDER — AZELASTINE HCL 0.1 % NA SOLN: 2.0000 | Freq: Two times a day (BID) | NASAL | 0 refills | Status: DC | PRN

## 2021-03-19 NOTE — Telephone Encounter (Signed)
Courtesy refill sent in. Called patient and advised. Patient verbalized understanding.

## 2021-03-19 NOTE — Telephone Encounter (Signed)
Patient is requesting a refill on Azelastine to help her until her next OV. She scheduled an appointment for 04/14/21 with Webb Silversmith.

## 2021-03-23 ENCOUNTER — Ambulatory Visit: Payer: BC Managed Care – PPO | Admitting: Physician Assistant

## 2021-03-23 ENCOUNTER — Ambulatory Visit: Payer: Self-pay

## 2021-03-23 ENCOUNTER — Encounter: Payer: Self-pay | Admitting: Physician Assistant

## 2021-03-23 ENCOUNTER — Other Ambulatory Visit: Payer: Self-pay

## 2021-03-23 ENCOUNTER — Ambulatory Visit (INDEPENDENT_AMBULATORY_CARE_PROVIDER_SITE_OTHER): Payer: BC Managed Care – PPO | Admitting: *Deleted

## 2021-03-23 VITALS — BP 119/78 | HR 73 | Ht 66.0 in | Wt 158.0 lb

## 2021-03-23 DIAGNOSIS — M79671 Pain in right foot: Secondary | ICD-10-CM

## 2021-03-23 DIAGNOSIS — M79642 Pain in left hand: Secondary | ICD-10-CM | POA: Diagnosis not present

## 2021-03-23 DIAGNOSIS — M25562 Pain in left knee: Secondary | ICD-10-CM

## 2021-03-23 DIAGNOSIS — Z8659 Personal history of other mental and behavioral disorders: Secondary | ICD-10-CM

## 2021-03-23 DIAGNOSIS — Z9889 Other specified postprocedural states: Secondary | ICD-10-CM

## 2021-03-23 DIAGNOSIS — Z8639 Personal history of other endocrine, nutritional and metabolic disease: Secondary | ICD-10-CM

## 2021-03-23 DIAGNOSIS — M3219 Other organ or system involvement in systemic lupus erythematosus: Secondary | ICD-10-CM

## 2021-03-23 DIAGNOSIS — F900 Attention-deficit hyperactivity disorder, predominantly inattentive type: Secondary | ICD-10-CM

## 2021-03-23 DIAGNOSIS — Z8669 Personal history of other diseases of the nervous system and sense organs: Secondary | ICD-10-CM

## 2021-03-23 DIAGNOSIS — R5383 Other fatigue: Secondary | ICD-10-CM

## 2021-03-23 DIAGNOSIS — M25552 Pain in left hip: Secondary | ICD-10-CM

## 2021-03-23 DIAGNOSIS — M722 Plantar fascial fibromatosis: Secondary | ICD-10-CM

## 2021-03-23 DIAGNOSIS — J309 Allergic rhinitis, unspecified: Secondary | ICD-10-CM

## 2021-03-23 DIAGNOSIS — G8929 Other chronic pain: Secondary | ICD-10-CM

## 2021-03-23 DIAGNOSIS — L52 Erythema nodosum: Secondary | ICD-10-CM

## 2021-03-23 DIAGNOSIS — Z79899 Other long term (current) drug therapy: Secondary | ICD-10-CM | POA: Diagnosis not present

## 2021-03-23 DIAGNOSIS — M79641 Pain in right hand: Secondary | ICD-10-CM | POA: Diagnosis not present

## 2021-03-23 DIAGNOSIS — M249 Joint derangement, unspecified: Secondary | ICD-10-CM

## 2021-03-23 DIAGNOSIS — I7300 Raynaud's syndrome without gangrene: Secondary | ICD-10-CM | POA: Diagnosis not present

## 2021-03-23 DIAGNOSIS — M79672 Pain in left foot: Secondary | ICD-10-CM

## 2021-03-23 DIAGNOSIS — K5000 Crohn's disease of small intestine without complications: Secondary | ICD-10-CM

## 2021-03-23 DIAGNOSIS — M25511 Pain in right shoulder: Secondary | ICD-10-CM

## 2021-03-23 DIAGNOSIS — R748 Abnormal levels of other serum enzymes: Secondary | ICD-10-CM

## 2021-03-23 MED ORDER — HYDROXYCHLOROQUINE SULFATE 200 MG PO TABS: 200.0000 mg | ORAL_TABLET | Freq: Two times a day (BID) | ORAL | 0 refills | Status: DC

## 2021-03-23 MED ORDER — PREDNISONE 5 MG PO TABS: ORAL_TABLET | ORAL | 0 refills | Status: DC

## 2021-03-23 NOTE — Progress Notes (Signed)
Office Visit Note  Patient: Robin [name]             Date of Birth: 06/11/1971           MRN: [id number]             PCP: Jacelyn Pi, Lilia Argue, MD Referring: Jacelyn Pi, Lilia Argue, * Visit Date: 03/23/2021 Occupation: _0 @  Subjective:  Pain in multiple joints   History of Present Illness: Robin [name] is a 50 y.o. female with history of systemic lupus erythematosus.  She is taking Plaquenil 200 mg 1 tablet by mouth twice daily Monday through Friday.  She has not missed any doses recently.  She states at the beginning of July she started to experience increased fatigue, swollen lymph nodes on the left side of her neck, intermittent body aches, and chills.  She was evaluated at urgent care and was diagnosed with a sinus infection and was started on clindamycin.  She states that she followed back up with urgent care due to having ongoing body aches and chills and was found to have a low white blood cell count.  She states that she tested negative for COVID-19.  She states that the symptoms she was experiencing were similar to when she had a lupus flare in 2017.  She states that about 4 days ago she started to experience increased pain in multiple joints including both hands, left knee, and both feet.  She brought pictures today revealing swelling in her feet as well as both hands.  She was having difficulty making a complete fist due to the pain and swelling.  She was also having difficulty bearing weight due to severity of pain and stiffness in her left knee.  She has been taking ibuprofen as needed for symptomatic relief and tried KT tape on her feet which have alleviated some of her discomfort.  She states that she woke up early this morning around 4 AM and was having severe joint pain and difficulty walking.  She took ibuprofen and was able to fall back asleep for several hours.  She states that several months ago she was diagnosed with hypermobility spectrum disorder and has been  going to physical therapy which overall has improved her arthralgias but over the past week feels as though she has been having increased joint pain and inflammation, which has started to concern her. She has been taking vitamin D and a vitamin B complex vitamin on a daily basis to help to improve her fatigue.  She continues to have intermittent oral and nasal ulcerations which is typically exacerbated by increased stress.  She has ongoing sicca symptoms which are unchanged.  She has intermittent symptoms of Raynaud's but denies any digital ulcerations.  She states about 1 week ago she had a generalized rash but no facial rash at this time.  She has been wearing sunscreen on a daily basis and trying to avoid direct sun exposure.    Activities of Daily Living:  Patient reports morning stiffness for 5-6 hours.   Patient Reports nocturnal pain.  Difficulty dressing/grooming: Denies Difficulty climbing stairs: Reports Difficulty getting out of chair: Reports Difficulty using hands for taps, buttons, cutlery, and/or writing: Reports  Review of Systems  Constitutional:  Positive for fatigue.  HENT:  Positive for mouth sores, mouth dryness and nose dryness.   Eyes:  Positive for pain, itching and dryness.  Respiratory:  Negative for shortness of breath and difficulty breathing.   Cardiovascular:  Positive for  chest pain. Negative for palpitations.  Gastrointestinal:  Negative for blood in stool, constipation and diarrhea.  Endocrine: Negative for increased urination.  Genitourinary:  Negative for difficulty urinating.  Musculoskeletal:  Positive for joint pain, joint pain, joint swelling, myalgias, morning stiffness, muscle tenderness and myalgias.  Skin:  Positive for color change and rash.  Allergic/Immunologic: Positive for susceptible to infections.  Neurological:  Positive for dizziness, numbness, headaches and weakness. Negative for memory loss.  Hematological:  Negative for  bruising/bleeding tendency.  Psychiatric/Behavioral:  Negative for confusion.    PMFS History:  Patient Active Problem List   Diagnosis Date Noted   Hypermobility syndrome 11/27/2020   Seasonal and perennial allergic rhinitis 11/06/2018   Mild intermittent asthma, uncomplicated 16/06/9603   Flexural atopic dermatitis 11/06/2018   History of hyperlipidemia 11/08/2016   History of anxiety 11/08/2016   History of retinal tear 11/08/2016   History of Bilateral plantar fasciitis 08/10/2016   High risk medication use 07/04/2016   Lupus ([place]) 05/23/2016   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[place]) 03/18/2016   ADHD (attention deficit hyperactivity disorder) 12/22/2014   Raynaud's phenomenon 10/17/2013   Hyperlipidemia 09/28/2011    Past Medical History:  Diagnosis Date   ADD (attention deficit disorder)    Allergy    Anemia    Anxiety    Asthma    Depression    Hyperlipidemia    Ileitis    Lupus (HCC)    PONV (postoperative nausea and vomiting)    mild nausea    Raynaud disease     Family History  Problem Relation Age of Onset   Heart disease Mother 92       CAD/stenting   Hyperlipidemia Mother    Arthritis Mother        ostearthritis   Hypertension Mother    Kidney disease Mother    Irritable bowel syndrome Mother    Stroke Mother 62       CVA   Fibromyalgia Mother    Eczema Mother    Hyperlipidemia Father    Neuropathy Father    Allergies Father        shellfish   Hyperlipidemia Sister    Hypertension Brother    Colon cancer Neg Hx    Inflammatory bowel disease Neg Hx    Allergic rhinitis Neg Hx    Angioedema Neg Hx    Atopy Neg Hx    Asthma Neg Hx    Immunodeficiency Neg Hx    Urticaria Neg Hx    Past Surgical History:  Procedure Laterality Date   CARPAL TUNNEL RELEASE Right    08/2019   CESAREAN SECTION     x 2   DENTAL SURGERY     DILITATION & CURRETTAGE/HYSTROSCOPY WITH NOVASURE ABLATION N/A 06/03/2020   Procedure: DILATATION & CURETTAGE/HYSTEROSCOPY WITH NOVASURE  ABLATION;  Surgeon: Aletha Halim, MD;  Location: [facility];  Service: Gynecology;  Laterality: N/A;   ENDOMETRIAL BIOPSY  04/20/2020       EYE SURGERY Bilateral    cryoretinopexy   Social History   Social History Narrative   Marital status: married x 17 years; happily married      Children: 2 children (11, 64)      Lives: with husband, 2 children.      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 [employer]      Tobacco: none      Alcohol: 1-2 glasses per night      Exercise: 3 times  per week         Immunization History  Administered Date(s) Administered   Influenza,inj,Quad PF,6+ Mos 05/12/2014, 11/27/2015, 05/23/2016, 10/30/2017, 07/30/2018   Moderna Sars-Covid-2 Vaccination 10/27/2020   PFIZER(Purple Top)SARS-COV-2 Vaccination 12/06/2019, 12/16/2019, 05/27/2020   Pneumococcal Conjugate-13 05/23/2016   Tdap 09/28/2011, 08/12/2020     Objective: Vital Signs: BP 119/78 (BP Location: Left Arm, Patient Position: Sitting, Cuff Size: Normal)   Pulse 73   Ht _0  (1.676 m)   Wt 158 lb (71.7 kg)   BMI 25.50 kg/m    Physical Exam Vitals and nursing note reviewed.  Constitutional:      Appearance: She is well-developed.  HENT:     Head: Normocephalic and atraumatic.  Eyes:     Conjunctiva/sclera: Conjunctivae normal.  Pulmonary:     Effort: Pulmonary effort is normal.  Abdominal:     Palpations: Abdomen is soft.  Musculoskeletal:     Cervical back: Normal range of motion.  Skin:    General: Skin is warm and dry.     Capillary Refill: Capillary refill takes 2 to 3 seconds.     Comments: No digital ulcerations or signs of gangrene noted.   Neurological:     Mental Status: She is alert and oriented to person, place, and time.  Psychiatric:        Behavior: Behavior normal.     Musculoskeletal Exam: C-spine, thoracic spine, and lumbar spine good ROM.  Shoulder joints, elbow joints, and wrist joints  have good ROM.  Tenderness and synovitis of the right 2nd MCP and PIP joint.  Tenderness over the right 3rd MCP and PIP and left 2nd-4th PIP joints.  Pain with making a complete fist.  Hip joints good ROM with no discomfort.  Painful ROM of the left knee with no warmth or effusion.  Ankle joints good ROM with no tenderness or joint swelling.  Tenderness and inflammation of bilateral 1st MTP joint.  Tenderness over the right 2nd and 3rd MTPs and left 2nd MTP joint.   CDAI Exam: CDAI Score: -- Patient Global: --; Provider Global: -- Swollen: 4 ; Tender: 16  Joint Exam 03/23/2021      Right  Left  Wrist   Tender   Tender  MCP 2  Swollen Tender   Tender  MCP 3   Tender     PIP 2  Swollen Tender   Tender  PIP 3   Tender   Tender  PIP 4      Tender  Knee      Tender  MTP 1  Swollen Tender  Swollen Tender  MTP 2   Tender   Tender  MTP 3   Tender        Investigation: No additional findings.  Imaging: No results found.  Recent Labs: Lab Results  Component Value Date   WBC 5.3 01/26/2021   HGB 14.5 01/26/2021   PLT 290 01/26/2021   NA 137 01/26/2021   K 4.5 01/26/2021   CL 101 01/26/2021   CO2 27 01/26/2021   GLUCOSE 87 01/26/2021   BUN 15 01/26/2021   CREATININE 0.86 01/26/2021   BILITOT 0.6 01/26/2021   ALKPHOS 26 (L) 06/01/2020   AST 18 01/26/2021   ALT 14 01/26/2021   PROT 7.4 01/26/2021   ALBUMIN 3.8 06/01/2020   CALCIUM 10.1 01/26/2021   GFRAA 93 01/26/2021    Speciality Comments: PLQ Eye Exam: 05/29/2020 WNL @ [facility] (in [place]) Follow up in 1 year.  ANA 1:320 speckled, history of oral ulcers, lymphadenopathy, photosensitivity, Raynauds, erythema nodosum, inflammatory arthritis.  Procedures:  No procedures performed Allergies: Avelox [moxifloxacin hcl in nacl], Cefaclor, Dust mite extract, Latex, Mold extract [trichophyton], Red wine complex [germanium], Meloxicam, and Naproxen    Assessment / Plan:     Visit Diagnoses: Other  systemic lupus erythematosus with other organ involvement (HCC) - ANA 1:320 speckled, history of oral ulcers, lymphadenopathy, photosensitivity, Raynauds, erythema nodosum, inflammatory arthritis: Patient presents today with symptoms consistent with possible lupus flare.  Earlier in July she started to experience increased fatigue, cervical lymphadenopathy on the left side of her neck, chills, and increased body aches.  She was evaluated at urgent care and diagnosed with a sinus infection and was treated with clindamycin.  After completing a course of clindamycin she continued to have persistent fatigue, body aches, and has started to have increased joint pain and joint swelling in multiple joints over the past several days.  She brought pictures of swelling in both hands and both feet.  She is also been having significant discomfort in her left knee and at times has had difficulty bearing weight due to the severity of pain and stiffness.  She has been taking ibuprofen as needed to manage her symptoms.  X-rays of both hands, left knee, and both feet were obtained today.  She continues to have active inflammation and tenderness over bilateral first MTP joints as well as the right second MCP and PIP joint as described above.  She has been taking Plaquenil 200 mg 1 tablet by mouth twice daily Monday through Friday.  She has not missed any doses of Plaquenil recently.  Her hydroxychloroquine drug level was 260 on 01/26/2021.  She continues to have intermittent symptoms of Raynaud's, oral and nasal ulcerations, and sicca symptoms.  She had a generalized rash last week which has since resolved.  She was strongly encouraged to avoid any direct sun exposure and to wear sunscreen SPF greater than 50 on a daily basis.  Lab work from 01/26/2021 was reviewed today in the office: UA normal, double-stranded DNA negative, complements within normal limits, ESR within normal limits, phosphorus within normal limits, and  hydroxychloroquine level was 260.  We will recheck the following autoimmune lab work today.  We discussed increasing the dose of Plaquenil to 200 mg 1 tablet by mouth twice daily for the next 3 months to try to improve her symptoms.  She was also given a prednisone taper starting at 20 mg tapering by 5 mg every 4 days to alleviate her joint pain and swelling.  She will follow-up in the office in 3 months.  Plan: Anti-DNA antibody, double-stranded, C3 and C4, Sedimentation rate, Protein / creatinine ratio, urine  High risk medication use -Plaquenil 200 mg 1 tablet by mouth twice daily.  Advised to increase the dose of Plaquenil for the next 3 months.  Hydroxychloroquine drug level was 260 on 01/26/2021.  According to the patient prior to having the hydroxychloroquine level checked she was compliant with taking Plaquenil as prescribed.  PLQ Eye Exam: 05/29/2020 WNL @ [facility] (in [place]) Follow up in 1 year.  CBC and CMP were drawn on 03/16/2021.  Plan: COMPLETE METABOLIC PANEL WITH GFR, CBC with Differential/Platelet  Raynaud's phenomenon without gangrene: She continues to have intermittent symptoms of Raynaud's.  No signs of sclerodactyly were noted.  Slightly delayed capillary refill 2 to 3 seconds noted.  No digital ulcerations or signs of gangrene were noted.  Erythema nodosum:  No recurrence.  Chronic right shoulder pain: She has good range of motion of the right shoulder joint on examination today.  No tenderness palpation noted.  Overall her discomfort has improved since starting physical therapy.  Pain in left hip: She has good range of motion of the left hip joint on examination today.  Her discomfort has improved since starting physical therapy.  Acute pain of left knee - She presents today with increased pain and intermittent inflammation in the left knee joint, which started about 4 days ago. She did not have any injury or perform any overuse activities prior to the onset  of symptoms.  She is not experiencing any mechanical symptoms currently.  She has been unable to bear weight at times due to the severity of pain and inflammation.  X-rays of the left knee were obtained today which were unremarkable.  A prednisone taper starting at 20 mg tapering by 5 mg every 4 days was sent to the pharmacy today.  Plan: XR KNEE 3 VIEW LEFT  Pain in both hands -She has been experiencing increased pain and intermittent swelling in both hands.  She brought several pictures of joint swelling in her MCPs and PIP joints.  X-rays of both hands updated today which were consistent with early osteoarthritis.  A prednisone taper was sent to the pharmacy.  She will also be increasing dose of Plaquenil for the next 3 months and we will reevaluate at that time.  Plan: XR Hand 2 View Right, XR Hand 2 View Left  Pain in both feet -Over the past 4 days she has been experiencing increased pain and swelling in her feet.  She brought pictures of erythema and swelling in bilateral 1st MTP joints.  She has tenderness and inflammation of both 1st MTPs on exam today.  She has been using KT tape which has alleviated some of her discomfort, but at times it has been difficult for her to bear weight.  X-rays of both feet were updated today which were consistent with osteoarthritic changes.  No erosive changes noted.  A prescription for prednisone was sent to the pharmacy today.  She can continue to use KT tape as needed. . Plan: XR Foot 2 Views Left, XR Foot 2 Views Right  Low serum alkaline phosphatase: Alk phos was 26 on 02/05/2021 which is overall stable.  Phosphorus was within normal limits on 01/26/2021.  Hypermobility of joint: Diagnosed by Dr. Raeford Razor.  She has been going to physical therapy which has been improving her arthralgias and joint stiffness.  Other fatigue: She has been experiencing increased fatigue over the past 1 to 2 weeks.  She has been taking a vitamin D supplement and vitamin B complex  vitamin on a daily basis.  S/P carpal tunnel release: Right-Asymptomatic at this time.   Plantar fasciitis: Improved with PT.  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    Follow-Up Instructions: Return in about 3 months (around 06/23/2021) for Systemic lupus erythematosus.   Ofilia Neas, PA-C  Note - This record has been created using Dragon software.  Chart creation errors have been sought, but may not always  have been located. Such creation errors do not reflect on  the standard of medical care.

## 2021-03-23 NOTE — Progress Notes (Signed)
Please call the patient with x-ray results.   X-rays of the left knee were unremarkable.  X-rays of both feet were consistent with osteoarthritis.  Hand x-rays revealed early signs of osteoarthritis.  No erosive changes noted in hands or feet.

## 2021-03-24 ENCOUNTER — Other Ambulatory Visit: Payer: Self-pay

## 2021-03-24 ENCOUNTER — Ambulatory Visit: Payer: BC Managed Care – PPO | Attending: Physician Assistant | Admitting: Physical Therapy

## 2021-03-24 ENCOUNTER — Encounter: Payer: Self-pay | Admitting: Physical Therapy

## 2021-03-24 DIAGNOSIS — M25552 Pain in left hip: Secondary | ICD-10-CM | POA: Insufficient documentation

## 2021-03-24 DIAGNOSIS — M357 Hypermobility syndrome: Secondary | ICD-10-CM | POA: Diagnosis not present

## 2021-03-24 DIAGNOSIS — M25511 Pain in right shoulder: Secondary | ICD-10-CM | POA: Insufficient documentation

## 2021-03-24 DIAGNOSIS — G8929 Other chronic pain: Secondary | ICD-10-CM | POA: Diagnosis not present

## 2021-03-24 DIAGNOSIS — M2550 Pain in unspecified joint: Secondary | ICD-10-CM

## 2021-03-24 DIAGNOSIS — M6281 Muscle weakness (generalized): Secondary | ICD-10-CM | POA: Insufficient documentation

## 2021-03-24 LAB — CBC WITH DIFFERENTIAL/PLATELET
Absolute Monocytes: 773 cells/uL (ref 200–950)
Basophils Absolute: 38 cells/uL (ref 0–200)
Basophils Relative: 0.5 %
Eosinophils Absolute: 375 cells/uL (ref 15–500)
Eosinophils Relative: 5 %
HCT: 41.7 % (ref 35.0–45.0)
Hemoglobin: 13.6 g/dL (ref 11.7–15.5)
Lymphs Abs: 2685 cells/uL (ref 850–3900)
MCH: 31.1 pg (ref 27.0–33.0)
MCHC: 32.6 g/dL (ref 32.0–36.0)
MCV: 95.4 fL (ref 80.0–100.0)
MPV: 9.2 fL (ref 7.5–12.5)
Monocytes Relative: 10.3 %
Neutro Abs: 3630 cells/uL (ref 1500–7800)
Neutrophils Relative %: 48.4 %
Platelets: 290 10*3/uL (ref 140–400)
RBC: 4.37 10*6/uL (ref 3.80–5.10)
RDW: 13.3 % (ref 11.0–15.0)
Total Lymphocyte: 35.8 %
WBC: 7.5 10*3/uL (ref 3.8–10.8)

## 2021-03-24 LAB — COMPLETE METABOLIC PANEL WITH GFR
AG Ratio: 1.7 (calc) (ref 1.0–2.5)
ALT: 100 U/L — ABNORMAL HIGH (ref 6–29)
AST: 58 U/L — ABNORMAL HIGH (ref 10–35)
Albumin: 4.7 g/dL (ref 3.6–5.1)
Alkaline phosphatase (APISO): 44 U/L (ref 31–125)
BUN: 19 mg/dL (ref 7–25)
CO2: 27 mmol/L (ref 20–32)
Calcium: 9.8 mg/dL (ref 8.6–10.2)
Chloride: 101 mmol/L (ref 98–110)
Creat: 0.95 mg/dL (ref 0.50–0.99)
Globulin: 2.8 g/dL (calc) (ref 1.9–3.7)
Glucose, Bld: 89 mg/dL (ref 65–99)
Potassium: 4.6 mmol/L (ref 3.5–5.3)
Sodium: 137 mmol/L (ref 135–146)
Total Bilirubin: 0.4 mg/dL (ref 0.2–1.2)
Total Protein: 7.5 g/dL (ref 6.1–8.1)
eGFR: 74 mL/min/{1.73_m2} (ref >=60)

## 2021-03-24 LAB — PROTEIN / CREATININE RATIO, URINE
Creatinine, Urine: 19 mg/dL — ABNORMAL LOW (ref 20–275)
Total Protein, Urine: <4 mg/dL — ABNORMAL LOW (ref 5–24)

## 2021-03-24 LAB — ANTI-DNA ANTIBODY, DOUBLE-STRANDED: ds DNA Ab: 1 IU/mL

## 2021-03-24 LAB — C3 AND C4
C3 Complement: 129 mg/dL (ref 83–193)
C4 Complement: 46 mg/dL (ref 15–57)

## 2021-03-24 LAB — SEDIMENTATION RATE: Sed Rate: 9 mm/h (ref 0–20)

## 2021-03-24 NOTE — Progress Notes (Signed)
CBC WNL.  ESR and complements WNL.  dsDNA is negative.  No proteinuria.   LFTs are significantly elevated: AST 58 and ALT 100.  Please clarify if the patient has been taking any tylenol or alcohol use.  She should avoid tylenol, NSAIDs use, and alcohol use.     Recheck LFTs in 2 weeks.

## 2021-03-24 NOTE — Therapy (Signed)
Robin [name], [place], [address] Phone: [phone]   Fax:  [phone]  Physical Therapy Treatment  Patient Details  Name: Robin [name] MRN: [id number] Date of Birth: 01/24/1971 Referring Provider (PT): Dr. Clearance Coots   Encounter Date: 03/24/2021   PT End of Session - 03/24/21 0900     Visit Number 9    Number of Visits 12    Date for PT Re-Evaluation 04/07/21    Authorization Type BCBS    PT Start Time [phone]    PT Stop Time 0930    PT Time Calculation (min) 33 min    Activity Tolerance Patient tolerated treatment well    Behavior During Therapy [facility] for tasks assessed/performed             Past Medical History:  Diagnosis Date   ADD (attention deficit disorder)    Allergy    Anemia    Anxiety    Asthma    Depression    Hyperlipidemia    Ileitis    Lupus (HCC)    PONV (postoperative nausea and vomiting)    mild nausea    Raynaud disease     Past Surgical History:  Procedure Laterality Date   CARPAL TUNNEL RELEASE Right    08/2019   CESAREAN SECTION     x 2   DENTAL SURGERY     DILITATION & CURRETTAGE/HYSTROSCOPY WITH NOVASURE ABLATION N/A 06/03/2020   Procedure: DILATATION & CURETTAGE/HYSTEROSCOPY WITH NOVASURE ABLATION;  Surgeon: Robin Halim, MD;  Location: [facility];  Service: Gynecology;  Laterality: N/A;   ENDOMETRIAL BIOPSY  04/20/2020       EYE SURGERY Bilateral    cryoretinopexy    There were no vitals filed for this visit.   Subjective Assessment - 03/24/21 0858     Subjective I had a virus but not sure if it was sinus or covid variant.  Then I have a Lupus flare up.  Still achy and sore.  I taped my toe and it really helped. Has to drive to [place] [facility]) today.    Currently in Pain? Yes    Pain Score 6     Pain Location Generalized    Pain Orientation Other (Comment)   foot, hip, general   Pain Descriptors / Indicators Aching;Sore    Pain  Type Chronic pain                  [facility] Adult PT Treatment/Exercise - 03/24/21 0001       Pilates   Pilates Reformer Downstretch 1 Red 1 yellow x 10 , added shoulder taps x 10 each side , removed to 1 red    Other Pilates roll down 1 red x 8 added UE: row, horizontal abd and single arm pull x 10 with T rotation      Knee/Hip Exercises: Aerobic   Elliptical 5 min L9 ramp, L1 resistance      Knee/Hip Exercises: Standing   Hip Abduction Both;Stengthening;15 reps    Abduction Limitations blue band    Hip Extension Stengthening;Both;1 set;15 reps;Knee bent    Extension Limitations high mat table for UEs    Functional Squat Limitations blue band x 15 worked on weight shift                      PT Short Term Goals - 01/15/21 1020       PT SHORT TERM GOAL #1  Title be independent in initial HEP for neutral spinal alignment and core.    Status Achieved      PT SHORT TERM GOAL #2   Title Pt will be mindful of neutral spine and setting shoulder prior to lifting, reaching    Status Achieved      PT SHORT TERM GOAL #3   Title Pt will understand FOTO score and potential to improve.    Status Achieved               PT Long Term Goals - 03/24/21 0934       PT LONG TERM GOAL #1   Title ind with advanced HEP    Status On-going      PT LONG TERM GOAL #2   Title FOTO score will improve to 60 % or more able    Status On-going      PT LONG TERM GOAL #3   Title Pt will be able to walk 3 -5 miles with pain and discomfort in back, LEs < 2/10.    Status On-going      PT LONG TERM GOAL #4   Title report < or = to 2-3/10 Rt shoulder pain with cleaning, lifting and carrying    Status On-going      PT LONG TERM GOAL #5   Title Pt will be able to particiapte in full body conditioning program with min increase in pain from baseline that resolves with rest within 2 hours    Status On-going                   Plan - 03/24/21 0934     Clinical  Impression Statement Patient cont to do well with respect to her physical function and hip, shoulder pain .  She cont to have issues with sinus and viral infection followed by lupus flare. She has been able to self manage her foot pain, hip and shoulder do not persist with ADLs, IADLs.  She has 1 more visit in 3 weeks to provide transition to Community exercise and Pilates, more advanced HEP if needed. No increased pain today.    PT Treatment/Interventions ADLs/Self Care Home Management;Cryotherapy;Electrical Stimulation;Moist Heat;Stair training;Functional mobility training;Therapeutic activities;Therapeutic exercise;Balance training;Neuromuscular re-education;Manual techniques;Patient/family education;Passive range of motion;Dry needling;Joint Manipulations;Taping;Other (comment)    PT Next Visit Plan FOTO, goals, HEP, DC Re-tape deltoid and include an I strip across the Y    [place] and Agree with Plan of Care Patient             Patient will benefit from skilled therapeutic intervention in order to improve the following deficits and impairments:  Decreased activity tolerance, Decreased strength, Decreased balance, Pain, Decreased endurance, Decreased coordination, Impaired UE functional use, Hypermobility, Decreased knowledge of precautions  Visit Diagnosis: Pain in left hip  Chronic right shoulder pain  Polyarthralgia  Hypermobility syndrome  Muscle weakness (generalized)     Problem List Patient Active Problem List   Diagnosis Date Noted   Hypermobility syndrome 11/27/2020   Seasonal and perennial allergic rhinitis 11/06/2018   Mild intermittent asthma, uncomplicated 63/09/6008   Flexural atopic dermatitis 11/06/2018   History of hyperlipidemia 11/08/2016   History of anxiety 11/08/2016   History of retinal tear 11/08/2016   History of Bilateral plantar fasciitis 08/10/2016   High risk medication use 07/04/2016   Lupus ([place]) 05/23/2016    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

## 2021-03-25 ENCOUNTER — Other Ambulatory Visit: Payer: Self-pay | Admitting: *Deleted

## 2021-03-25 DIAGNOSIS — M329 Systemic lupus erythematosus, unspecified: Secondary | ICD-10-CM

## 2021-03-25 DIAGNOSIS — Z79899 Other long term (current) drug therapy: Secondary | ICD-10-CM

## 2021-03-25 DIAGNOSIS — R7989 Other specified abnormal findings of blood chemistry: Secondary | ICD-10-CM

## 2021-03-25 DIAGNOSIS — M3219 Other organ or system involvement in systemic lupus erythematosus: Secondary | ICD-10-CM

## 2021-03-31 ENCOUNTER — Encounter: Payer: Self-pay | Admitting: Nurse Practitioner

## 2021-04-01 DIAGNOSIS — F331 Major depressive disorder, recurrent, moderate: Secondary | ICD-10-CM | POA: Diagnosis not present

## 2021-04-05 ENCOUNTER — Encounter: Payer: Self-pay | Admitting: Family

## 2021-04-05 ENCOUNTER — Other Ambulatory Visit: Payer: Self-pay

## 2021-04-05 ENCOUNTER — Ambulatory Visit (INDEPENDENT_AMBULATORY_CARE_PROVIDER_SITE_OTHER): Payer: BC Managed Care – PPO | Admitting: Family

## 2021-04-05 VITALS — BP 108/70 | HR 73 | Temp 97.7°F | Resp 16 | Ht 66.0 in | Wt 158.8 lb

## 2021-04-05 DIAGNOSIS — M329 Systemic lupus erythematosus, unspecified: Secondary | ICD-10-CM | POA: Diagnosis not present

## 2021-04-05 DIAGNOSIS — J4520 Mild intermittent asthma, uncomplicated: Secondary | ICD-10-CM | POA: Diagnosis not present

## 2021-04-05 DIAGNOSIS — Z1231 Encounter for screening mammogram for malignant neoplasm of breast: Secondary | ICD-10-CM

## 2021-04-05 DIAGNOSIS — Z1159 Encounter for screening for other viral diseases: Secondary | ICD-10-CM

## 2021-04-05 DIAGNOSIS — R748 Abnormal levels of other serum enzymes: Secondary | ICD-10-CM

## 2021-04-05 DIAGNOSIS — R1011 Right upper quadrant pain: Secondary | ICD-10-CM

## 2021-04-05 DIAGNOSIS — F909 Attention-deficit hyperactivity disorder, unspecified type: Secondary | ICD-10-CM

## 2021-04-05 DIAGNOSIS — Z0000 Encounter for general adult medical examination without abnormal findings: Secondary | ICD-10-CM

## 2021-04-05 DIAGNOSIS — K5000 Crohn's disease of small intestine without complications: Secondary | ICD-10-CM

## 2021-04-05 DIAGNOSIS — J3089 Other allergic rhinitis: Secondary | ICD-10-CM | POA: Diagnosis not present

## 2021-04-05 DIAGNOSIS — J302 Other seasonal allergic rhinitis: Secondary | ICD-10-CM

## 2021-04-05 DIAGNOSIS — M357 Hypermobility syndrome: Secondary | ICD-10-CM

## 2021-04-05 DIAGNOSIS — E782 Mixed hyperlipidemia: Secondary | ICD-10-CM

## 2021-04-05 NOTE — Progress Notes (Signed)
Provider: Marlowe Sax FNP-C   Topaz Raglin, Nelda Bucks, NP  Patient Care Team: Jayliani Wanner, Nelda Bucks, NP as PCP - General (Family Medicine) Valentina Shaggy, MD as Consulting Physician (Allergy and Immunology) Milus Banister, MD as Attending Physician (Gastroenterology) Aletha Halim, MD as Consulting Physician (Obstetrics and Gynecology) Drake Leach, [facility])  Extended Emergency Contact Information Primary Emergency Contact: Hellen,Todd Address: [place], [place] Phone: [phone] Mobile Phone: [phone] Relation: Spouse Secondary Emergency Contact: Egel[place] Mobile Phone: [phone] Relation: Other  Code Status:  Full Code  Goals of care: Advanced Directive information Advanced Directives 04/05/2021  Does Patient Have a Medical Advance Directive? Yes  Type of Advance Directive Out of facility DNR (pink MOST or yellow form)  Does patient want to make changes to medical advance directive? No - Patient declined  Would patient like information on creating a medical advance directive? No - Patient declined     Chief Complaint  Patient presents with   Establish Care    New Patient.     HPI:  Pt is a 50 y.o. female seen today to establish care at the practice for medical management of chronic diseases. Has a medical history of Lupus,Hyperlipidemia,Hypermobility spectrum disorder,Depression,Generalized anxiety disorder,Anorexia Nervous /bulimia ,seasonal allergies,Retina Tear,Hx of ileum/ileitis,dry mouth,dry eyes,Fibroids ,hx carpal Tunnel syndrome,ADHD, hx of smoking,Alcohol use among other conditions. States recently had ache and fatigue.she was diagnosed with Lupus. Her liver enzymes were elevated.would like lab rechecked. Also request Hep A checked.   She follows up with Psychiatry for Depression,Anxiety,ADHD and Anorexia. State mood stable.  Smoked 10 cigarettes per day for 6 yrs stopped more than  20 years ago.Deos not use any other illicit drugs.   . Drinks 1-2 glasses of Alcohol daily but has not been drinking of late since her liver enzymes were elevated.  Does exercise 30-60 minutes walking five days a week. Also does pilates x 1 per week  and Horseback riding twice a week. Immunization reviewed up to date. Requires hep C screening reports no high risk behaviors.  Status post fibroids ablation.states still has some bleeding.  Has an EPi pen but has never had to used it.states was given several year ago when she was young had an allergy test.   Past Medical History:  Diagnosis Date   ADD (attention deficit disorder)    Allergy    Anemia    Anorexia nervosa with bulimia    Per [place] New Patient Packet   Anxiety    Asthma    Carpal tunnel syndrome    Per [place] New Patient Packet   Depression    Dry eyes    Per [place] New Patient Packet   Dry mouth    Per [place] New Patient Packet   Fibroids    Per [place] New Patient Packet   History of hip x-ray 2021   Per [place] New Patient Packet   History of Papanicolaou smear of cervix 2021   Per [place] New Patient Packet, Dr.Pickens   Hyperlipidemia    Ileitis    Lupus ([place])    PONV (postoperative nausea and vomiting)    mild nausea    Raynaud disease    Retinal tear    Per [facility] New Patient Packet   Past Surgical History:  Procedure Laterality Date   CARPAL TUNNEL RELEASE Right    08/2019   CESAREAN SECTION  2003   CESAREAN SECTION  2005   Per [facility] New Patient Packet   COLONOSCOPY  2017   Dr.Jacobs   DENTAL SURGERY  2009   Fromed tooth and chronic infection   DILITATION & CURRETTAGE/HYSTROSCOPY WITH NOVASURE ABLATION N/A 06/03/2020   Procedure: DILATATION & CURETTAGE/HYSTEROSCOPY WITH NOVASURE ABLATION;  Surgeon: Aletha Halim, MD;  Location: [facility];  Service: Gynecology;  Laterality: N/A;   ENDOMETRIAL BIOPSY  04/20/2020       EYE SURGERY Bilateral    cryoretinopexy    Allergies  Allergen Reactions    Avelox [Moxifloxacin Hcl In Nacl] Other (See Comments)    Muscle cramps   Cefaclor Hives    Tolerates penicillins   Dust Mite Extract    Latex Other (See Comments)    "Raw skin"   Mold Extract [Trichophyton]     & cats    Red Wine Complex [Germanium]     Itching    Meloxicam Rash   Naproxen Rash    Allergies as of 04/05/2021       Reactions   Avelox [moxifloxacin Hcl In Nacl] Other (See Comments)   Muscle cramps   Cefaclor Hives   Tolerates penicillins   Dust Mite Extract    Latex Other (See Comments)   "Raw skin"   Mold Extract [trichophyton]    & cats    Red Wine Complex [germanium]    Itching    Meloxicam Rash   Naproxen Rash        Medication List        Accurate as of April 05, 2021  9:08 AM. If you have any questions, ask your nurse or doctor.          Acetyl-L-Carnitine HCl Powd by Does not apply route daily.   albuterol 108 (90 Base) MCG/ACT inhaler Commonly known as: VENTOLIN HFA Inhale 2 puffs into the lungs every 6 (six) hours as needed for wheezing or shortness of breath.   ALLEGRA PO Take by mouth daily.   amphetamine-dextroamphetamine 30 MG 24 hr capsule Commonly known as: ADDERALL XR Take 30 mg by mouth in the morning.   azelastine 0.1 % nasal spray Commonly known as: ASTELIN Place 2 sprays into both nostrils 2 (two) times daily as needed for rhinitis.   BENADRYL ALLERGY PO Take 50 mg by mouth daily at 12 noon.   buPROPion 300 MG 24 hr tablet Commonly known as: WELLBUTRIN XL Take 300 mg by mouth daily.   CO Q 10 PO Take by mouth daily.   COLLAGEN PO Take by mouth daily.   EPINEPHrine 0.3 mg/0.3 mL Soaj injection Commonly known as: EPI-PEN Inject 0.3 mLs (0.3 mg total) into the muscle as needed for anaphylaxis.   FLUoxetine 10 MG capsule Commonly known as: PROZAC Take 10 mg by mouth every morning.   hydroxychloroquine 200 MG tablet Commonly known as: PLAQUENIL Take 1 tablet (200 mg total) by mouth 2 (two) times daily.    Melatonin 10 MG Tabs Take 1 tablet by mouth daily.   Methylsulfonylmethane 1000 MG Caps Take 1 capsule by mouth daily.   montelukast 10 MG tablet Commonly known as: SINGULAIR TAKE 1 TABLET(10 MG) BY MOUTH AT BEDTIME   OMEGA-3-6-9 PO Take 1 capsule by mouth as needed.   predniSONE 5 MG tablet Commonly known as: DELTASONE Take 4 tablets by mouth daily x4 days, 3 tablets by mouth daily x4 days, 2 tablets by mouth daily x4 days, 1 tablet by mouth daily x4 days.   PROBIOTIC PO Take by mouth  daily.   QC TUMERIC COMPLEX PO Take 1 capsule by mouth daily.   UNABLE TO FIND once a week. Med Name: allergy shots   VITAMIN B COMPLEX PO Take by mouth daily.   VITAMIN D (CHOLECALCIFEROL) PO Take 500 mg by mouth.   Vitamin K2 100 MCG Caps Take 1 capsule by mouth daily.        Review of Systems  Constitutional:  Positive for fatigue. Negative for appetite change, chills, fever and unexpected weight change.       Hx of lupus follows up with Rheumatologist   HENT:  Negative for congestion, dental problem, ear discharge, ear pain, facial swelling, hearing loss, nosebleeds, postnasal drip, rhinorrhea, sinus pressure, sinus pain, sneezing, sore throat, tinnitus and trouble swallowing.        Seasonal allergies none today   Eyes:  Negative for pain, discharge, redness, itching and visual disturbance.  Respiratory:  Negative for cough, chest tightness, shortness of breath and wheezing.   Cardiovascular:  Negative for chest pain, palpitations and leg swelling.  Gastrointestinal:  Negative for abdominal distention, abdominal pain, blood in stool, constipation, diarrhea, nausea and vomiting.       Hx of lesion in ileum/ileitis in 2017   Upper abdominal recent pain in 1 week  has improved    Endocrine: Negative for cold intolerance, heat intolerance, polydipsia, polyphagia and polyuria.  Genitourinary:  Positive for vaginal bleeding. Negative for difficulty urinating, dysuria, flank pain,  frequency, urgency, vaginal discharge and vaginal pain.       Fibroids status post ablation still has spotting nit as heavy as before   Musculoskeletal:  Positive for myalgias. Negative for arthralgias, back pain, gait problem, joint swelling, neck pain and neck stiffness.  Skin:  Negative for color change, pallor, rash and wound.  Neurological:  Negative for dizziness, syncope, speech difficulty, weakness, light-headedness, numbness and headaches.  Hematological:  Does not bruise/bleed easily.  Psychiatric/Behavioral:  Negative for agitation, behavioral problems, confusion, hallucinations, self-injury, sleep disturbance and suicidal ideas. The patient is nervous/anxious.        Depression stable following up with counselor follows up with Psychiatry    Immunization History  Administered Date(s) Administered   Influenza,inj,Quad PF,6+ Mos 05/12/2014, 11/27/2015, 05/23/2016, 10/30/2017, 07/30/2018   Moderna Sars-Covid-2 Vaccination 10/27/2020, 10/31/2020   PFIZER(Purple Top)SARS-COV-2 Vaccination 12/06/2019, 12/16/2019, 05/27/2020   Pneumococcal Conjugate-13 05/23/2016   Tdap 09/28/2011, 08/12/2020   Pertinent  Health Maintenance Due  Topic Date Due   INFLUENZA VACCINE  04/12/2021   PAP SMEAR-Modifier  04/21/2023   COLONOSCOPY (Pts 45-103yr Insurance coverage will need to be confirmed)  04/08/2026   Fall Risk  04/05/2021 02/20/2020 10/04/2018 07/30/2018 02/02/2018  Falls in the past year? 0 0 1 1 No  Comment - - [place] 2 x - -  Number falls in past yr: 0 0 1 0 -  Injury with Fall? 0 0 1 1 -  Comment - - - RIGHT ANKLE - TWISTED -  Risk for fall due to : No Fall Risks - - - -  Follow up Falls evaluation completed - - - -   Functional Status Survey:    Vitals:   04/05/21 0837  BP: 108/70  Pulse: 73  Resp: 16  Temp: 97.7 F (36.5 C)  SpO2: 98%  Weight: 158 lb 12.8 oz (72 kg)  Height: _0  (1.676 m)   Body mass index is 25.63 kg/m. Physical Exam Vitals reviewed.   Constitutional:      General: She is not  in acute distress.    Appearance: Normal appearance. She is normal weight. She is not ill-appearing or diaphoretic.  HENT:     Head: Normocephalic.     Right Ear: Tympanic membrane, ear canal and external ear normal. There is no impacted cerumen.     Left Ear: Tympanic membrane, ear canal and external ear normal. There is no impacted cerumen.     Nose: Nose normal. No congestion or rhinorrhea.     Mouth/Throat:     Mouth: Mucous membranes are moist.     Pharynx: Oropharynx is clear. No oropharyngeal exudate or posterior oropharyngeal erythema.  Eyes:     General: No scleral icterus.       Right eye: No discharge.        Left eye: No discharge.     Extraocular Movements: Extraocular movements intact.     Conjunctiva/sclera: Conjunctivae normal.     Pupils: Pupils are equal, round, and reactive to light.  Neck:     Vascular: No carotid bruit.  Cardiovascular:     Rate and Rhythm: Normal rate and regular rhythm.     Pulses: Normal pulses.     Heart sounds: Normal heart sounds. No murmur heard.   No friction rub. No gallop.  Pulmonary:     Effort: Pulmonary effort is normal. No respiratory distress.     Breath sounds: Normal breath sounds. No wheezing, rhonchi or rales.  Chest:     Chest wall: No tenderness.  Abdominal:     General: Bowel sounds are normal. There is no distension.     Palpations: Abdomen is soft. There is no mass.     Tenderness: There is no abdominal tenderness. There is no right CVA tenderness, left CVA tenderness, guarding or rebound.  Musculoskeletal:        General: No swelling or tenderness. Normal range of motion.     Cervical back: Normal range of motion. No rigidity or tenderness.     Right lower leg: No edema.     Left lower leg: No edema.  Lymphadenopathy:     Cervical: No cervical adenopathy.  Skin:    General: Skin is warm and dry.     Coloration: Skin is not pale.     Findings: No bruising, erythema,  lesion or rash.  Neurological:     Mental Status: She is alert and oriented to person, place, and time.     Cranial Nerves: No cranial nerve deficit.     Sensory: No sensory deficit.     Motor: No weakness.     Coordination: Coordination normal.     Gait: Gait normal.  Psychiatric:        Mood and Affect: Mood normal.        Speech: Speech normal.        Behavior: Behavior normal.        Thought Content: Thought content normal.        Judgment: Judgment normal.    Labs reviewed: Recent Labs    09/01/20 1057 01/26/21 0000 03/23/21 1052  NA 137 137 137  K 4.4 4.5 4.6  CL 101 101 101  CO2 _0 GLUCOSE 84 87 89  BUN _1 CREATININE 0.89 0.86 0.95  CALCIUM 9.6 10.1 9.8  PHOS  --  4.1  --    Recent Labs    06/01/20 1000 09/01/20 1057 01/26/21 0000 03/23/21 1052  AST _2 58*  ALT _3 100*  ALKPHOS 26*  --   --   --   BILITOT 0.8 0.5 0.6 0.4  PROT 6.3* 7.0 7.4 7.5  ALBUMIN 3.8  --   --   --    Recent Labs    09/01/20 1057 01/26/21 0000 03/23/21 1052  WBC 4.2 5.3 7.5  NEUTROABS 2,096 3,212 3,630  HGB 13.4 14.5 13.6  HCT 41.4 44.2 41.7  MCV 96.3 95.7 95.4  PLT 273 290 290   Lab Results  Component Value Date   TSH 1.910 02/20/2020   No results found for: HGBA1C Lab Results  Component Value Date   CHOL 211 (H) 10/04/2018   HDL 68 10/04/2018   LDLCALC 117 (H) 10/04/2018   TRIG 128 10/04/2018   CHOLHDL 3.1 10/04/2018    Significant Diagnostic Results in last 30 days:  XR Foot 2 Views Left  Result Date: 03/23/2021 First MTP, DIP and PIP narrowing was noted.  No intertarsal, tibiotalar or subtalar joint space narrowing was noted.  Inferior calcaneal spur was noted.  No erosive changes were noted. Impression: These findings are consistent with osteoarthritis of the foot.  XR Foot 2 Views Right  Result Date: 03/23/2021 First MTP, DIP and PIP narrowing was noted.  No intertarsal, tibiotalar or subtalar joint space narrowing was noted.   Inferior calcaneal spur was noted.  No erosive changes were noted. Impression: These findings are consistent with osteoarthritis of the foot.  XR Hand 2 View Left  Result Date: 03/23/2021 CMC, PIP and DIP narrowing was noted.  No MCP, intercarpal or radiocarpal joint space narrowing was noted.  No erosive changes were noted. 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  XR Hand 2 View Right  Result Date: 03/23/2021 CMC, PIP and DIP narrowing was noted.  No MCP, intercarpal or radiocarpal joint space narrowing was noted.  No erosive changes were noted. 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  XR KNEE 3 VIEW LEFT  Result Date: 03/23/2021 No medial or lateral compartment narrowing was noted.  No chondrocalcinosis was noted.  No patellofemoral narrowing was noted. Impression: Unremarkable x-ray of the knee joint.   Assessment/Plan 1. Encounter for medical examination to establish care Immunization reviewed up to date  - CBC with Differential/Platelet; Future - CMP with eGFR([facility]); Future - TSH; Future - Lipid panel; Future  2. Lupus ([place]) Continue on prednisone and Plaquenil  - continue to follow up with Rheumatologist  - CBC with Differential/Platelet; Future - CMP with eGFR([facility]); Future  3. Mild intermittent asthma, uncomplicated Breathing stable  Has not required her Albuterol  - continue on singulair   4. Seasonal and perennial allergic rhinitis Continue on Allegra   5. Hypermobility syndrome Reports recent muscle aches Dx with lupus Continue to monitor  6. Mixed hyperlipidemia No labs for review - Lipid panel; Future  7. Attention deficit hyperactivity disorder (ADHD), unspecified ADHD type Stable  Continue on Adderral   8. 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 Encounter for hepatitis C screening test for low risk patient Reports no high risk behaviors  - Hep C  Antibody; Future  10. Elevated liver enzymes Will repeat labs - CMP with eGFR([facility]); Future - Hep A Antibody, Total; Future  11. Right upper quadrant pain Will repeat lab work then send for U/S if liver enzymes still elevated - CBC with Differential/Platelet; Future - CMP with eGFR([facility]); Future - Hep A Antibody, Total; Future  12. Breast cancer screening by mammogram Asymptomatic  -  MM DIGITAL SCREENING BILATERAL; Future  Family/ staff Communication: Reviewed plan of care with patient verbalized understanding   Labs/tests ordered:  - CBC with Differential/Platelet - CMP with eGFR([facility]) - TSH - Lipid panel - Hep C Antibody  Next Appointment : 6 months for medical management of chronic issues.Fasting Labs tomorrow or in one week.    Sandrea Hughs, NP

## 2021-04-07 ENCOUNTER — Ambulatory Visit (INDEPENDENT_AMBULATORY_CARE_PROVIDER_SITE_OTHER): Payer: BC Managed Care – PPO | Admitting: *Deleted

## 2021-04-07 ENCOUNTER — Other Ambulatory Visit: Payer: BC Managed Care – PPO

## 2021-04-07 ENCOUNTER — Telehealth: Payer: Self-pay

## 2021-04-07 ENCOUNTER — Other Ambulatory Visit: Payer: Self-pay

## 2021-04-07 DIAGNOSIS — E782 Mixed hyperlipidemia: Secondary | ICD-10-CM

## 2021-04-07 DIAGNOSIS — M329 Systemic lupus erythematosus, unspecified: Secondary | ICD-10-CM | POA: Diagnosis not present

## 2021-04-07 DIAGNOSIS — J309 Allergic rhinitis, unspecified: Secondary | ICD-10-CM

## 2021-04-07 DIAGNOSIS — Z1159 Encounter for screening for other viral diseases: Secondary | ICD-10-CM | POA: Diagnosis not present

## 2021-04-07 DIAGNOSIS — Z0000 Encounter for general adult medical examination without abnormal findings: Secondary | ICD-10-CM

## 2021-04-07 DIAGNOSIS — R748 Abnormal levels of other serum enzymes: Secondary | ICD-10-CM

## 2021-04-07 DIAGNOSIS — R1011 Right upper quadrant pain: Secondary | ICD-10-CM

## 2021-04-07 NOTE — Telephone Encounter (Signed)
Patient called stating she came in for lab work this morning and wanted to know if she has had a change since the last time. Patient was told she should not be taking ibuprofen due to some abnormal labs. Patient states she has had multiple migraines for the past few days and would like to know if she can now continue to use ibuprofen to treat migraines or if she should continue to avoid. If so what medication should she be taking for migraines. Please advise. Thank you. Message routed to [facility].

## 2021-04-07 NOTE — Telephone Encounter (Signed)
Still waiting for lab results.Recommend Imitrex 50 mg tablet x 1 dose.May repeat after 2 Hrs if still no relief  but do not exceed 200 mg per day for Migraine.

## 2021-04-08 ENCOUNTER — Other Ambulatory Visit: Payer: Self-pay | Admitting: Family

## 2021-04-08 DIAGNOSIS — G43009 Migraine without aura, not intractable, without status migrainosus: Secondary | ICD-10-CM

## 2021-04-08 LAB — COMPLETE METABOLIC PANEL WITH GFR
AG Ratio: 1.7 (calc) (ref 1.0–2.5)
ALT: 17 U/L (ref 6–29)
AST: 19 U/L (ref 10–35)
Albumin: 4.3 g/dL (ref 3.6–5.1)
Alkaline phosphatase (APISO): 30 U/L — ABNORMAL LOW (ref 31–125)
BUN: 11 mg/dL (ref 7–25)
CO2: 28 mmol/L (ref 20–32)
Calcium: 9.6 mg/dL (ref 8.6–10.2)
Chloride: 102 mmol/L (ref 98–110)
Creat: 0.91 mg/dL (ref 0.50–0.99)
Globulin: 2.6 g/dL (calc) (ref 1.9–3.7)
Glucose, Bld: 80 mg/dL (ref 65–99)
Potassium: 4.5 mmol/L (ref 3.5–5.3)
Sodium: 136 mmol/L (ref 135–146)
Total Bilirubin: 0.6 mg/dL (ref 0.2–1.2)
Total Protein: 6.9 g/dL (ref 6.1–8.1)
eGFR: 78 mL/min/{1.73_m2} (ref >=60)

## 2021-04-08 LAB — CBC WITH DIFFERENTIAL/PLATELET
Absolute Monocytes: 464 cells/uL (ref 200–950)
Basophils Absolute: 52 cells/uL (ref 0–200)
Basophils Relative: 0.9 %
Eosinophils Absolute: 191 cells/uL (ref 15–500)
Eosinophils Relative: 3.3 %
HCT: 39.3 % (ref 35.0–45.0)
Hemoglobin: 13.4 g/dL (ref 11.7–15.5)
Lymphs Abs: 1763 cells/uL (ref 850–3900)
MCH: 32.4 pg (ref 27.0–33.0)
MCHC: 34.1 g/dL (ref 32.0–36.0)
MCV: 95.2 fL (ref 80.0–100.0)
MPV: 9.7 fL (ref 7.5–12.5)
Monocytes Relative: 8 %
Neutro Abs: 3329 cells/uL (ref 1500–7800)
Neutrophils Relative %: 57.4 %
Platelets: 302 10*3/uL (ref 140–400)
RBC: 4.13 10*6/uL (ref 3.80–5.10)
RDW: 12.5 % (ref 11.0–15.0)
Total Lymphocyte: 30.4 %
WBC: 5.8 10*3/uL (ref 3.8–10.8)

## 2021-04-08 LAB — LIPID PANEL
Cholesterol: 260 mg/dL — ABNORMAL HIGH (ref <200)
HDL: 84 mg/dL (ref >=50)
LDL Cholesterol (Calc): 157 mg/dL (calc) — ABNORMAL HIGH
Non-HDL Cholesterol (Calc): 176 mg/dL (calc) — ABNORMAL HIGH (ref <130)
Total CHOL/HDL Ratio: 3.1 (calc) (ref <5.0)
Triglycerides: 84 mg/dL (ref <150)

## 2021-04-08 LAB — HEPATITIS A ANTIBODY, TOTAL: Hepatitis A AB,Total: NONREACTIVE

## 2021-04-08 LAB — TSH: TSH: 1.7 mIU/L

## 2021-04-08 LAB — HEPATITIS C ANTIBODY
Hepatitis C Ab: NONREACTIVE
SIGNAL TO CUT-OFF: 0.01 (ref <1.00)

## 2021-04-08 MED ORDER — SUMATRIPTAN SUCCINATE 50 MG PO TABS: 50.0000 mg | ORAL_TABLET | Freq: Once | ORAL | 0 refills | Status: DC

## 2021-04-08 NOTE — Telephone Encounter (Signed)
Called patient and informed of recommended plan. Patient is requesting a refill for imetrex.  Please advise. Thank you. Message routed to [facility].

## 2021-04-08 NOTE — Telephone Encounter (Signed)
Imitrex script send to pharmacy.

## 2021-04-08 NOTE — Telephone Encounter (Signed)
Per [name]- Recommend Imitrex 50 mg tablet x 1 dose.Robin [name] repeat after 2 Hrs if still no relief  but do not exceed 200 mg per day for Migraine.  Medication added to current medication list and sent to pharmacy.   Patient notified sent to pharmacy.

## 2021-04-09 ENCOUNTER — Other Ambulatory Visit: Payer: Self-pay

## 2021-04-09 DIAGNOSIS — E782 Mixed hyperlipidemia: Secondary | ICD-10-CM

## 2021-04-09 DIAGNOSIS — M329 Systemic lupus erythematosus, unspecified: Secondary | ICD-10-CM

## 2021-04-09 NOTE — Progress Notes (Signed)
cbc

## 2021-04-12 DIAGNOSIS — Z20822 Contact with and (suspected) exposure to covid-19: Secondary | ICD-10-CM | POA: Diagnosis not present

## 2021-04-12 DIAGNOSIS — Z20828 Contact with and (suspected) exposure to other viral communicable diseases: Secondary | ICD-10-CM | POA: Diagnosis not present

## 2021-04-13 NOTE — Patient Instructions (Signed)
Asthma Continue albuterol 2 puffs once every 4 hours as needed for cough or wheeze You may use albuterol 2 puffs 5 to 15 minutes before activity to decrease cough or wheeze  Allergic rhinitis Stop Allegra and begin Claritin 10 mg once a day as needed for runny nose or itch. Remember to rotate to a different antihistamine about every 3 months. Some examples of over the counter antihistamines include Zyrtec (cetirizine), Xyzal (levocetirizine), Allegra (fexofenadine), and Claritin (loratidine).  Begin Flonase 2 sprays in each nostril once a day as needed for a stuffy nose.  In the right nostril, point the applicator out toward the right ear. In the left nostril, point the applicator out toward the left ear Continue montelukast 10 mg once a day Continue azelastine 2 sprays in each nostril up to twice a day as needed for runny nose or drainage Continue saline nasal rinses as needed for nasal symptoms. Use this before any medicated nasal sprays for best result Continue allergen avoidance measures directed towards pollen, mold, dust mite, cat, and cockroach as listed below Continue allergen immunotherapy and have access to an epinephrine autoinjector set  Atopic dermatitis Continue twice a day moisturizing routine  Flushing We have collected a lab (tryptase) at today's visit. We will call you when the result becomes available. I have also ordered a lab to be drawn when you are having an episode of flushing.   Call the clinic if this treatment plan is not working well for you.  Follow up in 6 months or sooner if needed.  Reducing Pollen Exposure The American Academy of Allergy, Asthma and Immunology suggests the following steps to reduce your exposure to pollen during allergy seasons. Do not hang sheets or clothing out to dry; pollen may collect on these items. Do not mow lawns or spend time around freshly cut grass; mowing stirs up pollen. Keep windows closed at night.  Keep car windows closed  while driving. Minimize morning activities outdoors, a time when pollen counts are usually at their highest. Stay indoors as much as possible when pollen counts or humidity is high and on windy days when pollen tends to remain in the air longer. Use air conditioning when possible.  Many air conditioners have filters that trap the pollen spores. Use a HEPA room air filter to remove pollen form the indoor air you breathe.  Control of Mold Allergen Mold and fungi can grow on a variety of surfaces provided certain temperature and moisture conditions exist.  Outdoor molds grow on plants, decaying vegetation and soil.  The major outdoor mold, Alternaria and Cladosporium, are found in very high numbers during hot and dry conditions.  Generally, a late Summer - Fall peak is seen for common outdoor fungal spores.  Rain will temporarily lower outdoor mold spore count, but counts rise rapidly when the rainy period ends.  The most important indoor molds are Aspergillus and Penicillium.  Dark, humid and poorly ventilated basements are ideal sites for mold growth.  The next most common sites of mold growth are the bathroom and the kitchen.  Outdoor [employer] Use air conditioning and keep windows closed Avoid exposure to decaying vegetation. Avoid leaf raking. Avoid grain handling. Consider wearing a face mask if working in moldy areas.  Indoor Mold Control Maintain humidity below 50%. Clean washable surfaces with 5% bleach solution. Remove sources e.g. Contaminated carpets.   Control of Dust Mite Allergen Dust mites play a major role in allergic asthma and rhinitis. They occur in environments  with high humidity wherever human skin is found. Dust mites absorb humidity from the atmosphere (ie, they do not drink) and feed on organic matter (including shed human and animal skin). Dust mites are a microscopic type of insect that you cannot see with the naked eye. High levels of dust mites have been detected  from mattresses, pillows, carpets, upholstered furniture, bed covers, clothes, soft toys and any woven material. The principal allergen of the dust mite is found in its feces. A gram of dust may contain 1,000 mites and 250,000 fecal particles. Mite antigen is easily measured in the air during house cleaning activities. Dust mites do not bite and do not cause harm to humans, other than by triggering allergies/asthma.  Ways to decrease your exposure to dust mites in your home:  1. Encase mattresses, box springs and pillows with a mite-impermeable barrier or cover  2. Wash sheets, blankets and drapes weekly in hot water (130 F) with detergent and dry them in a dryer on the hot setting.  3. Have the room cleaned frequently with a vacuum cleaner and a damp dust-mop. For carpeting or rugs, vacuuming with a vacuum cleaner equipped with a high-efficiency particulate air (HEPA) filter. The dust mite allergic individual should not be in a room which is being cleaned and should wait 1 hour after cleaning before going into the room.  4. Do not sleep on upholstered furniture (eg, couches).  5. If possible removing carpeting, upholstered furniture and drapery from the home is ideal. Horizontal blinds should be eliminated in the rooms where the person spends the most time (bedroom, study, television room). Washable vinyl, roller-type shades are optimal.  6. Remove all non-washable stuffed toys from the bedroom. Wash stuffed toys weekly like sheets and blankets above.  7. Reduce indoor humidity to less than 50%. Inexpensive humidity monitors can be purchased at most hardware stores. Do not use a humidifier as can make the problem worse and are not recommended.  Control of Cockroach Allergen Cockroach allergen has been identified as an important cause of acute attacks of asthma, especially in urban settings.  There are fifty-five species of cockroach that exist in the [place], however only three, the  [place], [place] species produce allergen that can affect patients with Asthma.  Allergens can be obtained from fecal particles, egg casings and secretions from cockroaches.    Remove food sources. Reduce access to water. Seal access and entry points. Spray runways with 0.5-1% Diazinon or Chlorpyrifos Blow boric acid power under stoves and refrigerator. Place bait stations (hydramethylnon) at feeding sites.  Control of Dog or Cat Allergen Avoidance is the best way to manage a dog or cat allergy. If you have a dog or cat and are allergic to dog or cats, consider removing the dog or cat from the home. If you have a dog or cat but don't want to find it a new home, or if your family wants a pet even though someone in the household is allergic, here are some strategies that may help keep symptoms at bay:  Keep the pet out of your bedroom and restrict it to only a few rooms. Be advised that keeping the dog or cat in only one room will not limit the allergens to that room. Don't pet, hug or kiss the dog or cat; if you do, wash your hands with soap and water. High-efficiency particulate air (HEPA) cleaners run continuously in a bedroom or living room can reduce allergen levels over time.  Regular use of a high-efficiency vacuum cleaner or a central vacuum can reduce allergen levels. Giving your dog or cat a bath at least once a week can reduce airborne allergen.

## 2021-04-13 NOTE — Progress Notes (Signed)
a  [address] [place] [address] Dept: [phone]  FOLLOW UP NOTE  Patient ID: Robin [name], female    DOB: 02-11-1971  Age: 50 y.o. MRN: [id number] Date of Office Visit: 04/14/2021  Assessment  Chief Complaint: Nasal Congestion and Sinusitis (2 epidsodes in 4 weeks.)  HPI Robin [name] is a 50 year old female who presents clinic for follow-up visit.  She was last seen in this clinic on 07/07/2020 by Dr. 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  In the interim, she has had a sinus infection requiring antibiotics about one month ago.  At today's visit, she reports her asthma has been well controlled with no shortness of breath or cough with activity or rest.  She does report occasional wheeze [place] for which she uses albuterol with relief of symptoms.  Her last use of albuterol was several months ago.  Allergic rhinitis is reported as moderately well controlled with symptoms including rhinorrhea, and sneeze, and postnasal drainage with frequent throat clearing.  She continues montelukast 10 mg once a day, Allegra 180 mg once a day, and saline nasal rinses several times a day.  She reports that over the last 3 months she has started using azelastine and Mucinex as needed.  She does report that she has had an intermittent sore throat for the last 3 years.  She continues allergen immunotherapy with no large or local reactions.  She reports a significant decrease in her symptoms of allergic rhinitis while continuing on allergen immunotherapy.  Atopic dermatitis is reported as well controlled with a daily moisturizing routine.  At her last visit, she had been avoiding pineapple and banana, however, she is able to eat both of these foods at this time with no adverse reaction.  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  She reports frequent facial and chest flushing, frequent reflux for which she takes omeprazole occasionally, and occasional "brain fog".  Her current medications are listed in the chart.   Drug Allergies:  Allergies  Allergen Reactions   Avelox [Moxifloxacin Hcl In Nacl] Other (See Comments)    Muscle cramps   Cefaclor Hives    Tolerates penicillins   Dust Mite Extract    Latex Other (See Comments)    "Raw skin"   Mold Extract [Trichophyton]     & cats    Red Wine Complex [Germanium]     Itching    Meloxicam Rash   Naproxen Rash    Physical Exam: BP 120/78 (BP Location: Right Arm, Patient Position: Sitting, Cuff Size: Normal)   Pulse 82   Temp 98.7 F (37.1 C) (Temporal)   Resp 18   Wt 156 lb (70.8 kg)   SpO2 99%   BMI 25.18 kg/m    Physical Exam Vitals reviewed.  Constitutional:      Appearance: Normal appearance.  HENT:     Head: Normocephalic and atraumatic.     Right Ear: Tympanic membrane normal.     Left Ear: Tympanic membrane normal.     Nose:     Comments: Bilateral nares normal.  Pharynx with significant cobblestoning.  Ears normal.  Eyes normal. Eyes:     Conjunctiva/sclera: Conjunctivae normal.  Cardiovascular:     Rate and Rhythm: Normal rate and regular rhythm.     Heart sounds: Normal heart sounds. No murmur heard. Pulmonary:  Effort: Pulmonary effort is normal.     Breath sounds: Normal breath sounds.     Comments: Lungs clear to auscultation Musculoskeletal:        General: Normal range of motion.     Cervical back: Normal range of motion and neck supple.  Skin:    General: Skin is warm and dry.  Neurological:     Mental Status: She is alert and oriented to person, place, and time.  Psychiatric:        Mood and Affect: Mood normal.        Behavior: Behavior normal.        Thought Content: Thought content normal.        Judgment: Judgment normal.    Diagnostics: FVC 3.51, FEV1 2.93.  Predicted FVC 3.84, predicted FEV1 3.06.   Spirometry indicates normal ventilatory function.  Assessment and Plan: 1. Mild intermittent asthma without complication   2. Flushing   3. Seasonal and perennial allergic rhinitis   4. Flexural atopic dermatitis   5. Lupus ([place])     Meds ordered this encounter  Medications   azelastine (ASTELIN) 0.1 % nasal spray    Sig: Place 2 sprays into both nostrils 2 (two) times daily as needed for rhinitis.    Dispense:  30 mL    Refill:  0    This is a courtesy refill. Patient needs an OV for further refills.   EPINEPHrine 0.3 mg/0.3 mL IJ SOAJ injection    Sig: Inject 0.3 mg into the muscle as needed for anaphylaxis.    Dispense:  2 each    Refill:  1    Please dispense Mylan or Teva brand generic only. Thank you.   montelukast (SINGULAIR) 10 MG tablet    Sig: TAKE 1 TABLET(10 MG) BY MOUTH AT BEDTIME    Dispense:  30 tablet    Refill:  5     Patient Instructions  Asthma Continue albuterol 2 puffs once every 4 hours as needed for cough or wheeze You may use albuterol 2 puffs 5 to 15 minutes before activity to decrease cough or wheeze  Allergic rhinitis Stop Allegra and begin Claritin 10 mg once a day as needed for runny nose or itch. Remember to rotate to a different antihistamine about every 3 months. Some examples of over the counter antihistamines include Zyrtec (cetirizine), Xyzal (levocetirizine), Allegra (fexofenadine), and Claritin (loratidine).  Begin Flonase 2 sprays in each nostril once a day as needed for a stuffy nose.  In the right nostril, point the applicator out toward the right ear. In the left nostril, point the applicator out toward the left ear Continue montelukast 10 mg once a day Continue azelastine 2 sprays in each nostril up to twice a day as needed for runny nose or drainage Continue saline nasal rinses as needed for nasal symptoms. Use this before any medicated nasal sprays for best result Continue allergen avoidance measures directed towards pollen, mold,  dust mite, cat, and cockroach as listed below Continue allergen immunotherapy and have access to an epinephrine autoinjector set  Atopic dermatitis Continue twice a day moisturizing routine  Flushing We have collected a lab (tryptase) at today's visit. We will call you when the result becomes available. I have also ordered a lab to be drawn when you are having an episode of flushing.   Call the clinic if this treatment plan is not working well for you.  Follow up in 6 months or sooner if needed.   Return  in about 6 months (around 10/15/2021), or if symptoms worsen or fail to improve.    Thank you for the opportunity to care for this patient.  Please do not hesitate to contact me with questions.  Gareth Morgan, FNP [facility]

## 2021-04-14 ENCOUNTER — Other Ambulatory Visit: Payer: Self-pay

## 2021-04-14 ENCOUNTER — Ambulatory Visit: Payer: Self-pay | Admitting: *Deleted

## 2021-04-14 ENCOUNTER — Ambulatory Visit (INDEPENDENT_AMBULATORY_CARE_PROVIDER_SITE_OTHER): Payer: BC Managed Care – PPO | Admitting: Family Medicine

## 2021-04-14 ENCOUNTER — Encounter: Payer: Self-pay | Admitting: Family Medicine

## 2021-04-14 ENCOUNTER — Encounter: Payer: BC Managed Care – PPO | Admitting: Physical Therapy

## 2021-04-14 VITALS — BP 120/78 | HR 82 | Temp 98.7°F | Resp 18 | Wt 156.0 lb

## 2021-04-14 DIAGNOSIS — Z03818 Encounter for observation for suspected exposure to other biological agents ruled out: Secondary | ICD-10-CM | POA: Diagnosis not present

## 2021-04-14 DIAGNOSIS — M329 Systemic lupus erythematosus, unspecified: Secondary | ICD-10-CM | POA: Diagnosis not present

## 2021-04-14 DIAGNOSIS — J302 Other seasonal allergic rhinitis: Secondary | ICD-10-CM

## 2021-04-14 DIAGNOSIS — L2089 Other atopic dermatitis: Secondary | ICD-10-CM

## 2021-04-14 DIAGNOSIS — J4520 Mild intermittent asthma, uncomplicated: Secondary | ICD-10-CM

## 2021-04-14 DIAGNOSIS — J309 Allergic rhinitis, unspecified: Secondary | ICD-10-CM

## 2021-04-14 DIAGNOSIS — R232 Flushing: Secondary | ICD-10-CM

## 2021-04-14 DIAGNOSIS — J3089 Other allergic rhinitis: Secondary | ICD-10-CM

## 2021-04-14 MED ORDER — MONTELUKAST SODIUM 10 MG PO TABS: ORAL_TABLET | ORAL | 5 refills | Status: DC

## 2021-04-14 MED ORDER — AZELASTINE HCL 0.1 % NA SOLN: 2.0000 | Freq: Two times a day (BID) | NASAL | 0 refills | Status: DC | PRN

## 2021-04-14 MED ORDER — EPINEPHRINE 0.3 MG/0.3ML IJ SOAJ: 0.3000 mg | INTRAMUSCULAR | 1 refills | Status: AC | PRN

## 2021-04-15 DIAGNOSIS — F331 Major depressive disorder, recurrent, moderate: Secondary | ICD-10-CM | POA: Diagnosis not present

## 2021-04-16 LAB — TRYPTASE: Tryptase: 10 ug/L (ref 2.2–13.2)

## 2021-04-16 NOTE — Progress Notes (Signed)
Can you please let this patient know that her tryptase was normal at this time. We will use this as a baseline. She should go to any lab copr when she feels an attack and have a tryptase lab drawn. An order has already been placed. Thank you

## 2021-04-19 DIAGNOSIS — Z20822 Contact with and (suspected) exposure to covid-19: Secondary | ICD-10-CM | POA: Diagnosis not present

## 2021-04-19 DIAGNOSIS — Z03818 Encounter for observation for suspected exposure to other biological agents ruled out: Secondary | ICD-10-CM | POA: Diagnosis not present

## 2021-04-22 NOTE — Progress Notes (Signed)
Prednisone should not alter her tryptase level

## 2021-04-23 ENCOUNTER — Other Ambulatory Visit: Payer: Self-pay

## 2021-04-23 ENCOUNTER — Ambulatory Visit (INDEPENDENT_AMBULATORY_CARE_PROVIDER_SITE_OTHER): Payer: BC Managed Care – PPO

## 2021-04-23 DIAGNOSIS — M3219 Other organ or system involvement in systemic lupus erythematosus: Secondary | ICD-10-CM

## 2021-04-23 DIAGNOSIS — J309 Allergic rhinitis, unspecified: Secondary | ICD-10-CM

## 2021-04-23 NOTE — Telephone Encounter (Signed)
Next Visit: 06/22/2021  Last Visit: 03/23/2021  Labs: 04/07/2021 Alk. Phos 30  Eye exam: 05/29/2020 WNL    Current Dose per office note 03/23/2021: We discussed increasing the dose of Plaquenil to 200 mg 1 tablet by mouth twice daily   ZM:CEYEM systemic lupus erythematosus with other organ involvement   Last Fill:   Okay to refill Plaquenil?

## 2021-04-23 NOTE — Telephone Encounter (Signed)
Patient called requesting prescription refill of Plaquenil to be sent to [facility] at [address].  Patient states Lovena Le increased her dosage to 2 tablets per day and she is out of medication.

## 2021-04-24 ENCOUNTER — Other Ambulatory Visit: Payer: Self-pay | Admitting: Rheumatology

## 2021-04-24 DIAGNOSIS — M3219 Other organ or system involvement in systemic lupus erythematosus: Secondary | ICD-10-CM

## 2021-04-26 ENCOUNTER — Telehealth: Payer: Self-pay

## 2021-04-26 MED ORDER — HYDROXYCHLOROQUINE SULFATE 200 MG PO TABS: 200.0000 mg | ORAL_TABLET | Freq: Two times a day (BID) | ORAL | 0 refills | Status: DC

## 2021-04-26 NOTE — Telephone Encounter (Signed)
Patient advised Dr. Estanislado Pandy is working on sending in prescription refills at this time. Patient advised that she is not due to update labs yet. Patient was due to repeat LFTs 2 weeks after last labs. Patient had repeat labs with PCP on 04/07/2021 and LFTs are normal.

## 2021-04-26 NOTE — Telephone Encounter (Signed)
Future order placed 

## 2021-04-26 NOTE — Telephone Encounter (Signed)
Patient called stating she received notification from [facility] that her prescription refill of Plaquenil was denied by Dr. Estanislado Pandy.  Patient states she is out of medication and took her last pills on Thursday, 04/22/21.  Patient is also not sure if she should wait for labwork since she hasn't taken any medication for 5 days.

## 2021-04-26 NOTE — Telephone Encounter (Signed)
Please order hydroxychloroquine blood level with the next labs.

## 2021-04-26 NOTE — Addendum Note (Signed)
Addended by: Carole Binning on: 04/26/2021 02:09 PM   Modules accepted: Orders

## 2021-04-27 ENCOUNTER — Encounter: Payer: Self-pay | Admitting: Physical Therapy

## 2021-04-27 ENCOUNTER — Ambulatory Visit: Payer: BC Managed Care – PPO | Attending: Physician Assistant | Admitting: Physical Therapy

## 2021-04-27 ENCOUNTER — Other Ambulatory Visit: Payer: Self-pay

## 2021-04-27 DIAGNOSIS — M2550 Pain in unspecified joint: Secondary | ICD-10-CM | POA: Diagnosis not present

## 2021-04-27 DIAGNOSIS — M25511 Pain in right shoulder: Secondary | ICD-10-CM | POA: Insufficient documentation

## 2021-04-27 DIAGNOSIS — G8929 Other chronic pain: Secondary | ICD-10-CM

## 2021-04-27 DIAGNOSIS — M25552 Pain in left hip: Secondary | ICD-10-CM

## 2021-04-27 DIAGNOSIS — M6281 Muscle weakness (generalized): Secondary | ICD-10-CM | POA: Diagnosis not present

## 2021-04-27 NOTE — Therapy (Signed)
[place], [place], [address] Phone: [phone]   Fax:  [phone]  Physical Therapy Treatment  Patient Details  Name: Robin [name] MRN: [id number] Date of Birth: Mar 05, 1971 Referring Provider (PT): Dr. Clearance Coots   Encounter Date: 04/27/2021   PT End of Session - 04/27/21 0937     Visit Number 10    Number of Visits 12    Date for PT Re-Evaluation 04/27/21    Authorization Type BCBS    PT Start Time 0848    PT Stop Time 0930    PT Time Calculation (min) 42 min    Activity Tolerance Patient tolerated treatment well    Behavior During Therapy [facility] for tasks assessed/performed             Past Medical History:  Diagnosis Date   ADD (attention deficit disorder)    Allergy    Anemia    Anorexia nervosa with bulimia    Per [place] New Patient Packet   Anxiety    Asthma    Carpal tunnel syndrome    Per [place] New Patient Packet   Depression    Dry eyes    Per [place] New Patient Packet   Dry mouth    Per [place] New Patient Packet   Fibroids    Per [facility] New Patient Packet   History of hip x-ray 2021   Per [place] New Patient Packet   History of Papanicolaou smear of cervix 2021   Per [place] New Patient Packet, Dr.Pickens   Hyperlipidemia    Ileitis    Lupus ([place])    PONV (postoperative nausea and vomiting)    mild nausea    Raynaud disease    Retinal tear    Per [facility] New Patient Packet    Past Surgical History:  Procedure Laterality Date   CARPAL TUNNEL RELEASE Right    08/2019   CESAREAN SECTION  2003   CESAREAN SECTION  2005   Per [facility] New Patient Packet   COLONOSCOPY  2017   Dr.Jacobs   DENTAL SURGERY  2009   Fromed tooth and chronic infection   DILITATION & CURRETTAGE/HYSTROSCOPY WITH NOVASURE ABLATION N/A 06/03/2020   Procedure: DILATATION & CURETTAGE/HYSTEROSCOPY WITH NOVASURE ABLATION;  Surgeon: Aletha Halim, MD;  Location: [facility];  Service: Gynecology;   Laterality: N/A;   ENDOMETRIAL BIOPSY  04/20/2020       EYE SURGERY Bilateral    cryoretinopexy    There were no vitals filed for this visit.   Subjective Assessment - 04/27/21 0849     Subjective Has been on and off steroids.  Better overall compared to the beginning.  SI joint has been bothering me alot. Rt shoulder occ flare up and soreness. Taping it really helps it. May try to get into Pilates class (equipment).                [place] Adult PT Treatment/Exercise - 04/27/21 0001       Self-Care   Self-Care Other Self-Care Comments    Other Self-Care Comments  educated on FOTO and then set up, equipment changes for PIlates Reformer      Lumbar Exercises: Supine   Dead Bug 10 reps    Dead Bug Limitations UE and LE    Bridge with March 10 reps    Single Leg Bridge 10 reps    Advanced Lumbar Stabilization Limitations Bridge with SLR x 5 each side  Knee/Hip Exercises: Standing   Step Down Both;1 set;Hand Hold: 6;20 reps;Step Height: 6"      Shoulder Exercises: Seated   Horizontal ABduction Both;15 reps    Theraband Level (Shoulder Horizontal ABduction) Level 3 (Green)    Horizontal ABduction Weight (lbs) 5 sec hold    External Rotation Both;15 reps;Theraband    Theraband Level (Shoulder External Rotation) Level 3 (Green)    External Rotation Weight (lbs) 5 sec hold      Manual Therapy   Kinesiotex Inhibit Muscle;Ligament Correction      Kinesiotix   Inhibit Muscle  deltoid Y ant and post with stretch, 25%    Ligament Correction 1 "I " strip lateral 50% stretch across lateral deltoid                      PT Short Term Goals - 04/27/21 3559       PT SHORT TERM GOAL #1   Title be independent in initial HEP for neutral spinal alignment and core.    Status Achieved      PT SHORT TERM GOAL #2   Title Pt will be mindful of neutral spine and setting shoulder prior to lifting, reaching    Status Achieved      PT SHORT TERM GOAL #3   Title Pt will  understand FOTO score and potential to improve.    Status Achieved      PT SHORT TERM GOAL #4   Title report a 30% reduction in Lt hip pain with walking, sit to stand and after sitting for work    Status Achieved               PT Long Term Goals - 04/27/21 0939       PT LONG TERM GOAL #1   Title ind with advanced HEP    Status Achieved      PT LONG TERM GOAL #2   Title FOTO score will improve to 60 % or more able    Baseline 51% met status goal    Status Partially Met      PT LONG TERM GOAL #3   Title Pt will be able to walk 3 -5 miles with pain and discomfort in back, LEs < 2/10.    Status Partially Met      PT LONG TERM GOAL #4   Title report < or = to 2-3/10 Rt shoulder pain with cleaning, lifting and carrying    Status Achieved      PT LONG TERM GOAL #5   Title Pt will be able to particiapte in full body conditioning program with min increase in pain from baseline that resolves with rest within 2 hours    Status Achieved                   Plan - 04/27/21 0900     Clinical Impression Statement Patient recovered from lupus flare up and is able to manage her symptoms at home with tape and exercise, medication.  She has had to come on and off steriods and noticed an increase in shoulder pain.  She has met many of her goals and is interested in attending community Pilates classes which will address multi joint pain and instabiity, conditioning.    PT Treatment/Interventions ADLs/Self Care Home Management;Cryotherapy;Electrical Stimulation;Moist Heat;Stair training;Functional mobility training;Therapeutic activities;Therapeutic exercise;Balance training;Neuromuscular re-education;Manual techniques;Patient/family education;Passive range of motion;Dry needling;Joint Manipulations;Taping;Other (comment)    PT Next Visit Plan DC    PT  Home Exercise Plan [email or web address]    Consulted and Agree with Plan of Care Patient             Patient will benefit from skilled  therapeutic intervention in order to improve the following deficits and impairments:  Decreased activity tolerance, Decreased strength, Decreased balance, Pain, Decreased endurance, Decreased coordination, Impaired UE functional use, Hypermobility, Decreased knowledge of precautions  Visit Diagnosis: Pain in left hip  Polyarthralgia  Chronic right shoulder pain  Muscle weakness (generalized)     Problem List Patient Active Problem List   Diagnosis Date Noted   Flushing 04/14/2021   Hypermobility syndrome 11/27/2020   Seasonal and perennial allergic rhinitis 11/06/2018   Mild intermittent asthma without complication 97/28/2060   Flexural atopic dermatitis 11/06/2018   History of hyperlipidemia 11/08/2016   History of anxiety 11/08/2016   History of retinal tear 11/08/2016   History of Bilateral plantar fasciitis 08/10/2016   High risk medication use 07/04/2016   Lupus ([place]) 05/23/2016   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 [place], [place], [address] Phone: [phone]   Fax:  [phone]  Name: Robin [name] MRN: [id number] Date of Birth: 1970-11-06   PHYSICAL THERAPY DISCHARGE SUMMARY  Visits from Start of Care: 10  Current functional level related to goals / functional outcomes: See above    Remaining deficits: Intermittent chronic pain in ankle, SIJ, shoulder    Education / Equipment: HEP, core , Pilates , alignment , tape    Patient agrees to discharge. Patient goals were partially met. Patient is being discharged due to being pleased with the current functional level.  Raeford Razor, PT 04/27/21 9:49 AM Phone: [phone] Fax: [phone]

## 2021-04-28 ENCOUNTER — Ambulatory Visit: Payer: BC Managed Care – PPO | Admitting: Physician Assistant

## 2021-04-29 DIAGNOSIS — F331 Major depressive disorder, recurrent, moderate: Secondary | ICD-10-CM | POA: Diagnosis not present

## 2021-05-02 NOTE — Progress Notes (Signed)
05/02/2021 Robin [name] [id number] 1971-02-26   CHIEF COMPLAINT: Elevated LFTs  [name] OF PRESENT ILLNESS:  Robin [name]. [name] is a 50 year Robin female with a past medical [name] of anxiety, depression, remote anorexia/bulimia as a teenager, asthma, anemia, hyperlipidemia, Raynaud's disease, and Lupus. S/P C section x 2.   She was initially seen by Robin [name] in 2017 due to having RLQ abdominal pain with CT imaging concerning for ileitis.  She underwent a colonoscopy by Robin [name] on 04/08/2016 which showed congestion and furrowed and granular mucosa to the distal ileum. Biopsies of the TI showed lymphoid hyperplasia without evidence of active ileitis or granulomas. She was last seen in office on 07/13/2016 to review her colonoscopy results and it was discussed she possible had infectious enteritis which resulted in a Lupus like reaction. She has not been seen in our office since then.   She presents today as referred by Robin Sams PA-C for further evaluation regarding elevated LFTs. She reported having a left ear infection with associated swollen glands on 03/12/2021. She went to urgent care and she was prescribed Clindamycin tid x 10 days for a sinus infection. She had chills, took Tylenol alternating with Ibuprofen and stayed in bed for a few days and her symptoms improved. About one week later, she developed a Lupus flare described as having swollen knees and legs which occurred after she went horse back riding. She was seen by her rheumatologist on 03/23/2010 and her AST was 58 and ALT 100. Normal Alk phos and T. Bili levels. Her Plaquenil dose was increased and she was prescribed a course of Prednisone. A Covid 19 test was negative. She continued to feel fatigued. Repeat labs done 04/07/2021 showed normal LFTs with AST 19. ALT 17. Abdominal imaging was not done since her LFTs normalized.   She complains of having intermittent lower abdominal tenderness with associated bloat for the past  year. Clothing that presses on her abdomen causes more discomfort. She is passing a larger normal brown to orange colored BM two days weekly, smaller stools the other days of the week. She often does not feel emptied. She had a few days of looser stools when she took the Clindamycin which resolved. No rectal bleeding or black stools. She takes probiotics on and off for the past 10 years. She underwent a uterine ablation secondary to having uterine fibroids 05/2020. She continued to have abdominal bloat following this procedure. She takes Ibuprofen 438m once to twice daily a few days monthly for migraine headaches.   She was diagnosed with a generalized hypermobility disorder, reports she does not have Ehlers Danlos syndrome.    CMP Latest Ref Rng & Units 04/07/2021 03/23/2021 01/26/2021  Glucose 65 - 99 mg/dL 80 89 87  BUN 7 - 25 mg/dL 11 19 15   Creatinine 0.50 - 0.99 mg/dL 0.91 0.95 0.86  Sodium 135 - 146 mmol/L 136 137 137  Potassium 3.5 - 5.3 mmol/L 4.5 4.6 4.5  Chloride 98 - 110 mmol/L 102 101 101  CO2 20 - 32 mmol/L 28 27 27   Calcium 8.6 - 10.2 mg/dL 9.6 9.8 10.1  Total Protein 6.1 - 8.1 g/dL 6.9 7.5 7.4  Total Bilirubin 0.2 - 1.2 mg/dL 0.6 0.4 0.6  Alkaline Phos 38 - 126 U/L - - -  AST 10 - 35 U/L 19 58(H) 18  ALT 6 - 29 U/L 17 100(H) 14  Hepatitis A total antibody negative 04/07/2021.  Hepatitis C antibody negative 04/07/2021.  CBC  Latest Ref Rng & Units 04/07/2021 03/23/2021 01/26/2021  WBC 3.8 - 10.8 Thousand/uL 5.8 7.5 5.3  Hemoglobin 11.7 - 15.5 g/dL 13.4 13.6 14.5  Hematocrit 35.0 - 45.0 % 39.3 41.7 44.2  Platelets 140 - 400 Thousand/uL 302 290 290    Colonoscopy 04/08/2016 by Robin [name]: - Congested, furrowed and granular mucosa in the distal ileum. Biopsied. - The examination was otherwise normal on direct and retroflexion views. Biopsy results: BENIGN TERMINAL ILEUM MUCOSA WITH LYMPHOID HYPERPLASIA. - NO ACTIVE ILEITIS, GRANULOMAS, ADENOMATOUS CHANGE OR MALIGNANCY  IDENTIFIED.   Past Medical [name]:  Diagnosis Date   ADD (attention deficit disorder)    Allergy    Anemia    Anorexia nervosa with bulimia    Per Robin [name] Robin [name]   Anxiety    Asthma    Carpal tunnel syndrome    Per Robin [name] Robin [name]   Depression    Dry eyes    Per Robin [name] Robin [name]   Dry mouth    Per Robin [name] Robin [name]   Fibroids    Per Robin [name] Robin [name]   [name] of hip x-ray 2021   Per Robin [name] Robin [name]   [name] of Papanicolaou smear of cervix 2021   Per Robin [name] Robin [name], RobinPickens   Hyperlipidemia    Ileitis    Lupus (Robin [name])    PONV (postoperative nausea and vomiting)    mild nausea    Raynaud disease    Retinal tear    Per Robin [name] Robin [name]   Past Surgical [name]:  Procedure Laterality Date   CARPAL TUNNEL RELEASE Right    08/2019   CESAREAN SECTION  2003   CESAREAN SECTION  2005   Per Robin [name] Robin [name]   COLONOSCOPY  2017   RobinJacobs   DENTAL SURGERY  2009   Robin [name] and chronic infection   DILITATION & CURRETTAGE/HYSTROSCOPY WITH NOVASURE ABLATION N/A 06/03/2020   Procedure: DILATATION & CURETTAGE/HYSTEROSCOPY WITH NOVASURE ABLATION;  Surgeon: Robin Halim, MD;  Location: [name];  Service: Gynecology;  Laterality: N/A;   ENDOMETRIAL BIOPSY  04/20/2020       EYE SURGERY Bilateral    cryoretinopexy   Social [name]:  She is married. She has one sone and one daughter. She is a Actuary. Past smoker. She drinks one alcoholic beverage daily. No drug use.   Robin [name]: Robin [name] includes ADD / ADHD in her daughter and sister; Allergies in her father; Anxiety disorder in her daughter; Arthritis in her mother; Asthma in her daughter; Bipolar disorder in her sister; Depression in her sister; Eczema in her mother; Fibromyalgia in her mother; Heart disease (age of onset: 88) in her mother; Hyperlipidemia in her father, mother, and sister; Hypertension in her brother and  mother; Irritable bowel syndrome in her mother; Kidney disease in her mother; Neuropathy in her father; OCD in her brother and son; Prostatitis in her father; Stroke (age of onset: 7) in her mother. No Robin [name] of celiac disease, IBD or colorectal cancer.   Allergies  Allergen Reactions   Avelox [Moxifloxacin Hcl In Nacl] Other (See Comments)    Muscle cramps   Cefaclor Hives    Tolerates penicillins   Dust Mite Extract    Latex Other (See Comments)    "Raw skin"   Mold Extract [Trichophyton]     & cats    Red Wine Complex [Germanium]     Itching    Meloxicam  Rash   Naproxen Rash     Outpatient Encounter Medications as of 05/03/2021  Medication Sig   Acetylcarnitine HCl (ACETYL-L-CARNITINE HCL) POWD by Does not apply route daily.   albuterol (PROVENTIL HFA;VENTOLIN HFA) 108 (90 Base) MCG/ACT inhaler Inhale 2 puffs into the lungs every 6 (six) hours as needed for wheezing or shortness of breath.   amphetamine-dextroamphetamine (ADDERALL XR) 30 MG 24 hr capsule Take 30 mg by mouth in the morning.   azelastine (ASTELIN) 0.1 % nasal spray Place 2 sprays into both nostrils 2 (two) times daily as needed for rhinitis.   B Complex Vitamins (VITAMIN B COMPLEX PO) Take by mouth daily.   buPROPion (WELLBUTRIN XL) 300 MG 24 hr tablet Take 300 mg by mouth daily.   Coenzyme Q10 (CO Q 10 PO) Take by mouth daily.   COLLAGEN PO Take by mouth daily.   DiphenhydrAMINE HCl (BENADRYL ALLERGY PO) Take 50 mg by mouth daily at 12 noon.   EPINEPHrine 0.3 mg/0.3 mL IJ SOAJ injection Inject 0.3 mg into the muscle as needed for anaphylaxis.   Fexofenadine HCl (ALLEGRA PO) Take by mouth daily.   FLUoxetine (PROZAC) 10 MG capsule Take 10 mg by mouth every morning.   hydroxychloroquine (PLAQUENIL) 200 MG tablet Take 1 tablet (200 mg total) by mouth 2 (two) times daily.   Melatonin 10 MG TABS Take 1 tablet by mouth daily.   Menaquinone-7 (VITAMIN K2) 100 MCG CAPS Take 1 capsule by mouth daily.    Methylsulfonylmethane 1000 MG CAPS Take 1 capsule by mouth daily.   montelukast (SINGULAIR) 10 MG tablet TAKE 1 TABLET(10 MG) BY MOUTH AT BEDTIME   Omega 3-6-9 Fatty Acids (OMEGA-3-6-9 PO) Take 1 capsule by mouth as needed.   predniSONE (DELTASONE) 5 MG tablet Take 4 tablets by mouth daily x4 days, 3 tablets by mouth daily x4 days, 2 tablets by mouth daily x4 days, 1 tablet by mouth daily x4 days.   Probiotic Product (PROBIOTIC PO) Take by mouth daily.   SUMAtriptan (IMITREX) 50 MG tablet Take 1 tablet (50 mg total) by mouth once for 1 dose. May repeat in 2 hours if headache persists or recurs, but do not exceed 249m per day for migraine.   Turmeric (QC TUMERIC COMPLEX PO) Take 1 capsule by mouth daily.   UNABLE TO FIND once a week. Med Name: allergy shots   VITAMIN D, CHOLECALCIFEROL, PO Take 500 mg by mouth.   No facility-administered encounter medications on file as of 05/03/2021.    REVIEW OF SYSTEMS:  Gen: Denies fever, sweats or chills. No weight loss.  CV: Denies chest pain, palpitations or edema. Resp: Denies cough, shortness of breath of hemoptysis.  GI: See HPI.  GU : Increased urination.  MS: Denies current joint pain, muscles aches or weakness. Derm: Denies rash, itchiness, skin lesions or unhealing ulcers. Psych: + Anxiety and depression.  Heme: Denies bruising, bleeding. Neuro:  Denies headaches, dizziness or paresthesias. Endo:  Denies any problems with DM, thyroid or adrenal function.  PHYSICAL EXAM: BP 100/60   Pulse 88   Ht 5' 6"  (1.676 m)   Wt 156 lb (70.8 kg)   BMI 25.18 kg/m   General: 89year Robin female in no acute distress. Head: Normocephalic and atraumatic. Eyes:  Sclerae non-icteric, conjunctive pink. Ears: Normal auditory acuity. Mouth: Dentition intact. No ulcers or lesions.  Neck: Supple, no lymphadenopathy or thyromegaly.  Lungs: Clear bilaterally to auscultation without wheezes, crackles or rhonchi. Heart: Regular rate and rhythm. No murmur,  rub  or gallop appreciated.  Abdomen: Soft, non distended. Mild tenderness above the umbilicus, RUQ and throughout the lower abdomen without rebound or guarding. No masses. No hepatosplenomegaly. Normoactive bowel sounds x 4 quadrants.  Rectal:  Musculoskeletal: Symmetrical with no gross deformities. Skin: Warm and dry. No rash or lesions on visible extremities. Extremities: No edema. Neurological: Alert oriented x 4, no focal deficits.  Psychological:  Alert and cooperative. Normal mood and affect.  ASSESSMENT AND PLAN:   36) 50 year Robin female with Mildly elevated LFTs, resolved. Most likely viral component. +/- Lupus flare. Covid 19 test was negative.   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 Estanislado Pandy  3) Chronic abdominal bloat with intermittent lower abdominal pain -SIBO breath test -Ibgard one po bid -Miralax to increase stool output  -Probiotic of choice  -FODMAP handout   4) Hypermobility disorder   5) Colon cancer screening. Colonoscopy 7/28/217 showed benign lymphoid hyperplasia at the TI, no evidence of active ileitis/IBD. No polyps.  -Robin [name] to verify recall colonoscopy date        CC:  Jacelyn Pi, Irma M, *

## 2021-05-03 ENCOUNTER — Ambulatory Visit (INDEPENDENT_AMBULATORY_CARE_PROVIDER_SITE_OTHER): Payer: BC Managed Care – PPO | Admitting: *Deleted

## 2021-05-03 ENCOUNTER — Encounter: Payer: Self-pay | Admitting: Nurse Practitioner

## 2021-05-03 ENCOUNTER — Ambulatory Visit (INDEPENDENT_AMBULATORY_CARE_PROVIDER_SITE_OTHER): Payer: BC Managed Care – PPO | Admitting: Nurse Practitioner

## 2021-05-03 VITALS — BP 100/60 | HR 88 | Ht 66.0 in | Wt 156.0 lb

## 2021-05-03 DIAGNOSIS — R140 Abdominal distension (gaseous): Secondary | ICD-10-CM | POA: Diagnosis not present

## 2021-05-03 DIAGNOSIS — R1030 Lower abdominal pain, unspecified: Secondary | ICD-10-CM | POA: Diagnosis not present

## 2021-05-03 DIAGNOSIS — J309 Allergic rhinitis, unspecified: Secondary | ICD-10-CM

## 2021-05-03 NOTE — Patient Instructions (Addendum)
You have been given a testing kit to check for small intestine bacterial overgrowth (SIBO) which is completed by a company named [facility].   Make sure to return your test in the mail using the return mailing label given to you along with the kit. Your demographic and insurance information have already been sent to the company and they should be in contact with you over the next week regarding this test.   [facility] will collect an upfront charge of $99.74 for commercial insurance plans and $209.74 is you are paying cash. Make sure to discuss with [facility] PRIOR to having the test if they have gotten informatoin from your insurance company as to how much your testing will cost out of pocket, if any. Please keep in mind that you will be getting a call from phone number [phone] or a similar number. If you do not hear from them within this time frame, please call our office at [phone].   We have given you samples of the following medication to take:  IBgard 1 capsule twice a day as needed for abdominal pain/bloat. You can purchase this over the counter.  Miralax- Dissolve one capful in 8 ounces of water and drink before bed.  Review FODMAP diet.  It was great seeing you today! Thank you for entrusting me with your care and choosing [facility].  Noralyn Pick, CRNP  Low-FODMAP Eating Plan  FODMAP stands for fermentable oligosaccharides, disaccharides, monosaccharides, and polyols. These are sugars that are hard for some people to digest. A low-FODMAP eating plan may help some people who have irritable bowel syndrome (IBS) and certain other bowel (intestinal) diseases to manage their symptoms. This meal plan can be complicated to follow. Work with a diet and nutrition specialist (dietitian) to make a low-FODMAP eating plan that is right for you. A dietitian can helpmake sure that you get enough nutrition from this diet. What are tips for following  this plan? Reading food labels Check labels for hidden FODMAPs such as: High-fructose syrup. Honey. Agave. Natural fruit flavors. Onion or garlic powder. Choose low-FODMAP foods that contain 3-4 grams of fiber per serving. Check food labels for serving sizes. Eat only one serving at a time to make sure FODMAP levels stay low. Shopping Shop with a list of foods that are recommended on this diet and make a meal plan. Meal planning Follow a low-FODMAP eating plan for up to 6 weeks, or as told by your health care provider or dietitian. To follow the eating plan: Eliminate high-FODMAP foods from your diet completely. Choose only low-FODMAP foods to eat. You will do this for 2-6 weeks. Gradually reintroduce high-FODMAP foods into your diet one at a time. Most people should wait a few days before introducing the next new high-FODMAP food into their meal plan. Your dietitian can recommend how quickly you may reintroduce foods. Keep a daily record of what and how much you eat and drink. Make note of any symptoms that you have after eating. Review your daily record with a dietitian regularly to identify which foods you can eat and which foods you should avoid. General tips Drink enough fluid each day to keep your urine pale yellow. Avoid processed foods. These often have added sugar and may be high in FODMAPs. Avoid most dairy products, whole grains, and sweeteners. Work with a dietitian to make sure you get enough fiber in your diet. Avoid high FODMAP foods at meals to manage symptoms. Recommended foods Fruits Bananas, oranges, tangerines, lemons,  limes, blueberries, raspberries, strawberries, grapes, cantaloupe, honeydew melon, kiwi, papaya, passion fruit, and pineapple. Limited amounts of dried cranberries, banana chips, and shreddedcoconut. Vegetables Eggplant, zucchini, cucumber, peppers, green beans, bean sprouts, lettuce, arugula, kale, Swiss chard, spinach, collard greens, bok choy, summer  squash, potato, and tomato. Limited amounts of corn, carrot, and sweet potato. Greenparts of scallions. Grains Gluten-free grains, such as rice, oats, buckwheat, quinoa, corn, polenta, andmillet. Gluten-free pasta, bread, or cereal. Rice noodles. Corn tortillas. Meats and other proteins Unseasoned beef, pork, poultry, or fish. Eggs. Berniece Salines. Tofu (firm) and tempeh. Limited amounts of nuts and seeds, such as almonds, walnuts, [place] nuts,pecans, peanuts, nut butters, pumpkin seeds, chia seeds, and sunflower seeds. Dairy Lactose-free milk, yogurt, and kefir. Lactose-free cottage cheese and ice cream. Non-dairy milks, such as almond, coconut, hemp, and rice milk. Non-dairy yogurt. Limited amounts of goat cheese, brie, mozzarella, parmesan, swiss, andother hard cheeses. Fats and oils Butter-free spreads. Vegetable oils, such as olive, canola, and sunflower oil. Seasoning and other foods Artificial sweeteners with names that do not end in "ol," such as aspartame, saccharine, and stevia. Maple syrup, white table sugar, raw sugar, brown sugar, and molasses. Mayonnaise, soy sauce, and tamari. Fresh basil, coriander,parsley, rosemary, and thyme. Beverages Water and mineral water. Sugar-sweetened soft drinks. Small amounts of orangejuice or cranberry juice. Black and green tea. Most dry wines. Coffee. The items listed above may not be a complete list of foods and beverages you can eat. Contact a dietitian for more information. Foods to avoid Fruits Fresh, dried, and juiced forms of apple, pear, watermelon, peach, plum, cherries, apricots, blackberries, boysenberries, figs, nectarines, and mango.Avocado. Vegetables Chicory root, artichoke, asparagus, cabbage, snow peas, Brussels sprouts, broccoli, sugar snap peas, mushrooms, celery, and cauliflower. Onions, garlic,leeks, and the white part of scallions. Grains Wheat, including kamut, durum, and semolina. Barley and bulgur. Couscous.Wheat-based cereals. Wheat  noodles, bread, crackers, and pastries. Meats and other proteins Fried or fatty meat. Sausage. Cashews and pistachios. Soybeans, baked beans, black beans, chickpeas, kidney beans, fava beans, navy beans, lentils,black-eyed peas, and split peas. Dairy Milk, yogurt, ice cream, and soft cheese. Cream and sour cream. Milk-basedsauces. Custard. Buttermilk. Soy milk. Seasoning and other foods Any sugar-free gum or candy. Foods that contain artificial sweeteners such as sorbitol, mannitol, isomalt, or xylitol. Foods that contain honey, high-fructose corn syrup, or agave. Bouillon, vegetable stock, beef stock, and chicken stock. Garlic and onion powder. Condiments made with onion, such ashummus, chutney, pickles, relish, salad dressing, and salsa. Tomato paste. Beverages Chicory-based drinks. Coffee substitutes. Chamomile tea. Fennel tea. Sweet or fortified wines such as port or sherry. Diet soft drinks made with isomalt, mannitol, maltitol, sorbitol, or xylitol. Apple, pear, and mango juice. Juiceswith high-fructose corn syrup. The items listed above may not be a complete list of foods and beverages you should avoid. Contact a dietitian for more information. Summary FODMAP stands for fermentable oligosaccharides, disaccharides, monosaccharides, and polyols. These are sugars that are hard for some people to digest. A low-FODMAP eating plan is a short-term diet that helps to ease symptoms of certain bowel diseases. The eating plan usually lasts up to 6 weeks. After that, high-FODMAP foods are reintroduced gradually and one at a time. This can help you find out which foods may be causing symptoms. A low-FODMAP eating plan can be complicated. It is best to work with a dietitian who has experience with this type of plan. This information is not intended to replace advice given to you by your health care provider. Make sure  you discuss any questions you have with your healthcare provider. Document Revised:  01/16/2020 Document Reviewed: 01/16/2020 Elsevier Patient Education  [place].   If you are age 66 or older, your body mass index should be between 23-30. Your Body mass index is 25.18 kg/m. If this is out of the aforementioned range listed, please consider follow up with your Primary Care Provider.  If you are age 68 or younger, your body mass index should be between 19-25. Your Body mass index is 25.18 kg/m. If this is out of the aformentioned range listed, please consider follow up with your Primary Care Provider.   The [facility] providers would like to encourage you to use [facility] to communicate with providers for non-urgent requests or questions.  Due to long hold times on the telephone, sending your provider a message by [facility] may be faster and more efficient way to get a response. Please allow 48 business hours for a response.  Please remember that this is for non-urgent requests/questions.

## 2021-05-04 NOTE — Progress Notes (Signed)
I agree with the above note, plan. She needs recall colonoscopy 03/2026 (10 years from last one)

## 2021-05-06 DIAGNOSIS — F339 Major depressive disorder, recurrent, unspecified: Secondary | ICD-10-CM | POA: Diagnosis not present

## 2021-05-06 DIAGNOSIS — F411 Generalized anxiety disorder: Secondary | ICD-10-CM | POA: Diagnosis not present

## 2021-05-13 ENCOUNTER — Ambulatory Visit (INDEPENDENT_AMBULATORY_CARE_PROVIDER_SITE_OTHER): Payer: BC Managed Care – PPO | Admitting: *Deleted

## 2021-05-13 DIAGNOSIS — J309 Allergic rhinitis, unspecified: Secondary | ICD-10-CM

## 2021-05-27 DIAGNOSIS — F902 Attention-deficit hyperactivity disorder, combined type: Secondary | ICD-10-CM | POA: Diagnosis not present

## 2021-05-27 DIAGNOSIS — F411 Generalized anxiety disorder: Secondary | ICD-10-CM | POA: Diagnosis not present

## 2021-06-02 DIAGNOSIS — F331 Major depressive disorder, recurrent, moderate: Secondary | ICD-10-CM | POA: Diagnosis not present

## 2021-06-03 DIAGNOSIS — H1013 Acute atopic conjunctivitis, bilateral: Secondary | ICD-10-CM | POA: Diagnosis not present

## 2021-06-03 DIAGNOSIS — H04129 Dry eye syndrome of unspecified lacrimal gland: Secondary | ICD-10-CM | POA: Diagnosis not present

## 2021-06-03 DIAGNOSIS — M329 Systemic lupus erythematosus, unspecified: Secondary | ICD-10-CM | POA: Diagnosis not present

## 2021-06-03 DIAGNOSIS — F331 Major depressive disorder, recurrent, moderate: Secondary | ICD-10-CM | POA: Diagnosis not present

## 2021-06-03 DIAGNOSIS — Z79899 Other long term (current) drug therapy: Secondary | ICD-10-CM | POA: Diagnosis not present

## 2021-06-06 DIAGNOSIS — Z03818 Encounter for observation for suspected exposure to other biological agents ruled out: Secondary | ICD-10-CM | POA: Diagnosis not present

## 2021-06-06 DIAGNOSIS — Z20822 Contact with and (suspected) exposure to covid-19: Secondary | ICD-10-CM | POA: Diagnosis not present

## 2021-06-07 DIAGNOSIS — Z79899 Other long term (current) drug therapy: Secondary | ICD-10-CM | POA: Diagnosis not present

## 2021-06-07 DIAGNOSIS — F902 Attention-deficit hyperactivity disorder, combined type: Secondary | ICD-10-CM | POA: Diagnosis not present

## 2021-06-08 NOTE — Progress Notes (Signed)
Office Visit Note  Patient: Robin [name]             Date of Birth: [birth year]           MRN: [id number]             PCP: Sandrea Hughs, NP Referring: Jacelyn Pi, Lilia Argue, * Visit Date: 06/22/2021 Occupation: @GUAROCC @  Subjective:  Lupus (Flare in July, 2022, lower energy, SI joint pain)   History of Present Illness: Robin [name] is a 50 y.o. female with a history of systemic lupus, Raynauds, erythema nodosum and arthralgias.  She increased her hydroxychloroquine dose to 200 mg twice daily after the last visit.  She has noticed improvement in her joint pain and symptoms.  She has had few EN lesions which were not very prominent.  The Raynauds is not very symptomatic during the winter months.  She denies history of oral ulcers, nasal ulcers, malar rash, photosensitivity or lymphadenopathy.  She continues to have sicca symptoms.  Activities of Daily Living:  Patient reports morning stiffness for 2 hours.   Patient Reports nocturnal pain.  Difficulty dressing/grooming: Denies Difficulty climbing stairs: Denies Difficulty getting out of chair: Reports Difficulty using hands for taps, buttons, cutlery, and/or writing: Reports  Review of Systems  Constitutional:  Positive for fatigue.  HENT:  Positive for mouth dryness.   Eyes:  Positive for dryness.  Respiratory:  Negative for shortness of breath.   Cardiovascular:  Positive for swelling in legs/feet.  Gastrointestinal:  Positive for constipation and diarrhea.  Endocrine: Positive for cold intolerance, heat intolerance, excessive thirst and increased urination.  Genitourinary:  Negative for difficulty urinating.  Musculoskeletal:  Positive for joint pain, joint pain and morning stiffness. Negative for gait problem, joint swelling, muscle weakness and muscle tenderness.  Skin:  Negative for rash.  Allergic/Immunologic: Positive for susceptible to infections.  Neurological:  Positive for numbness and weakness.   Hematological:  Negative for bruising/bleeding tendency.  Psychiatric/Behavioral:  Positive for sleep disturbance.    PMFS History:  Patient Active Problem List   Diagnosis Date Noted   Flushing 04/14/2021   Hypermobility syndrome 11/27/2020   Seasonal and perennial allergic rhinitis 11/06/2018   Mild intermittent asthma without complication 74/04/1447   Flexural atopic dermatitis 11/06/2018   History of hyperlipidemia 11/08/2016   History of anxiety 11/08/2016   History of retinal tear 11/08/2016   History of Bilateral plantar fasciitis 08/10/2016   High risk medication use 07/04/2016   Lupus ([place]) 05/23/2016   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[place]) 03/18/2016   ADHD (attention deficit hyperactivity disorder) 12/22/2014   Raynaud's phenomenon 10/17/2013   Hyperlipidemia 09/28/2011    Past Medical History:  Diagnosis Date   ADD (attention deficit disorder)    Allergy    Anemia    Anorexia nervosa with bulimia    Per [facility] Patient Packet   Anxiety    Asthma    Carpal tunnel syndrome    Per [facility] Packet   Depression    Dry eyes    Per [facility] Packet   Dry mouth    Per [facility] Packet   Fibroids    Per [facility] Packet   History of hip x-ray 2021   Per [facility]   History of Papanicolaou smear of cervix 2021   Per [facility] Patient Packet, Dr.Pickens   Hyperlipidemia    Ileitis    Lupus ([place])    PONV (postoperative nausea and  vomiting)    mild nausea    Raynaud disease    Retinal tear    Per [facility] Patient Packet   Systemic lupus erythematosus ([place])     Family History  Problem Relation Age of Onset   Heart disease Mother 51       CAD/stenting   Hyperlipidemia Mother    Arthritis Mother        ostearthritis   Hypertension Mother    Kidney disease Mother    Irritable bowel syndrome Mother    Stroke Mother 12       CVA   Fibromyalgia Mother    Eczema Mother    Hyperlipidemia Father    Neuropathy Father    Allergies Father         shellfish   Prostatitis Father    Hyperlipidemia Sister    ADD / ADHD Sister    Bipolar disorder Sister    Depression Sister    Hypertension Brother    OCD Brother    Anxiety disorder Daughter    ADD / ADHD Daughter    Asthma Daughter    OCD Son    Colon cancer Neg Hx    Inflammatory bowel disease Neg Hx    Allergic rhinitis Neg Hx    Angioedema Neg Hx    Atopy Neg Hx    Immunodeficiency Neg Hx    Urticaria Neg Hx    Past Surgical History:  Procedure Laterality Date   CARPAL TUNNEL RELEASE Right    08/2019   CESAREAN SECTION  2003   CESAREAN SECTION  2005   Per [facility] Patient Packet   COLONOSCOPY  2017   Dr.Jacobs   DENTAL SURGERY  2009   Fromed tooth and chronic infection   DILITATION & CURRETTAGE/HYSTROSCOPY WITH NOVASURE ABLATION N/A 06/03/2020   Procedure: DILATATION & CURETTAGE/HYSTEROSCOPY WITH NOVASURE ABLATION;  Surgeon: Aletha Halim, MD;  Location: [facility];  Service: Gynecology;  Laterality: N/A;   ENDOMETRIAL BIOPSY  04/20/2020       EYE SURGERY Bilateral    cryoretinopexy   Social History   Social History Narrative   Marital status: married x 17 years; happily married      Children: 2 children (56, 69)      Lives: with husband, 2 children.      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 4 (including me)       Pets: 1 dog, 2 cats       Highest level or education completed: Undergraduate degree       Current/Past profession: Actuary        Exercise:        Yes          Type and how often: 30-60 min walking  5 days a week. Pilate's 1 x a week, and horseback riding 2 times a week          Living Will: No   DNR: no, would like to discuss one    POA/HPOA: No      Functional Status:   Do you have difficulty bathing or dressing yourself? Left blank    Do you have difficulty preparing food or eating? Left blank   Do you have difficulty managing your medications? Left blank   Do you have difficulty managing your finances? Left blank   Do you have difficulty affording your medications? Left blank   Immunization History  Administered Date(s) Administered   Influenza,inj,Quad PF,6+ Mos 05/12/2014, 11/27/2015, 05/23/2016, 10/30/2017, 07/30/2018   Moderna Sars-Covid-2 Vaccination 10/27/2020, 10/31/2020   PFIZER(Purple Top)SARS-COV-2 Vaccination 12/06/2019, 12/16/2019, 05/27/2020   Pneumococcal Conjugate-13 05/23/2016   Tdap 09/28/2011, 08/12/2020     Objective: Vital Signs: BP 102/65 (BP Location: Left Arm, Patient Position: Sitting, Cuff Size: Normal)   Pulse 67   Resp 14   Ht 5' 6"  (1.676 m)   Wt 158 lb (71.7 kg)   BMI 25.50 kg/m    Physical Exam Vitals and nursing note reviewed.  Constitutional:      Appearance: She is well-developed.  HENT:     Head: Normocephalic and atraumatic.  Eyes:     Conjunctiva/sclera: Conjunctivae normal.  Cardiovascular:     Rate and Rhythm: Normal rate and regular rhythm.     Heart sounds: Normal heart sounds.  Pulmonary:     Effort: Pulmonary effort is normal.     Breath sounds: Normal breath sounds.  Abdominal:     General: Bowel sounds are normal.     Palpations: Abdomen is soft.  Musculoskeletal:     Cervical back: Normal range of motion.  Lymphadenopathy:     Cervical: No cervical adenopathy.  Skin:    General: Skin is warm and dry.     Capillary Refill: Capillary refill takes 2 to 3 seconds.  Neurological:     Mental Status: She is alert and oriented to person,  place, and time.  Psychiatric:        Behavior: Behavior normal.     Musculoskeletal Exam: C-spine was in good range of motion.  Shoulder joints, elbow joints, wrist joints, MCPs PIPs and DIPs with good range of motion with no synovitis.  Hip joints, knee joints, ankles, MTPs and PIPs with good range of motion with no synovitis.  CDAI Exam: CDAI Score: -- Patient Global: --; Provider Global: -- Swollen: --; Tender: -- Joint Exam 06/22/2021   No joint exam has been documented for this visit   There is currently no information documented on the homunculus. Go to the Rheumatology activity and complete the homunculus joint exam.  Investigation: No additional findings.  Imaging: No results found.  Recent Labs: Lab Results  Component Value Date   WBC 5.8 04/07/2021   HGB 13.4 04/07/2021   PLT 302 04/07/2021   NA 136 04/07/2021   K 4.5 04/07/2021   CL 102 04/07/2021   CO2 28 04/07/2021   GLUCOSE 80 04/07/2021   BUN 11 04/07/2021   CREATININE 0.91 04/07/2021   BILITOT 0.6 04/07/2021   ALKPHOS 26 (L) 06/01/2020   AST 19 04/07/2021   ALT 17 04/07/2021   PROT 6.9 04/07/2021   ALBUMIN 3.8 06/01/2020   CALCIUM 9.6 04/07/2021   GFRAA 93 01/26/2021    Speciality Comments: PLQ Eye Exam: 05/29/2020 WNL @ [facility] (in [place]) Follow up in 1 year.  ANA 1:320 speckled, history of oral ulcers, lymphadenopathy, photosensitivity, Raynauds, erythema nodosum, inflammatory arthritis.  Procedures:  No procedures performed Allergies: Avelox [moxifloxacin hcl in nacl], Cefaclor, Dust mite extract, Latex, Mold extract [trichophyton], Red wine complex [germanium], Meloxicam, and Naproxen   Assessment / Plan:     Visit Diagnoses: Other systemic lupus erythematosus with other organ involvement (HCC) - ANA 1:320 speckled, history of oral ulcers, lymphadenopathy, photosensitivity, Raynauds, erythema nodosum, inflammatory arthritis: She is doing better since she has been  on hydroxychloroquine 1 tablet p.o. twice daily.  She states she has been more compliant taking her medications.  She has noticed improvement in her joint discomfort.  I will recheck labs in December to monitor disease process.  Her autoimmune labs were negative but hydroxychloroquine level was low in the past.  She believes that she was having a flare of her symptoms.  High risk medication use - Plaquenil 200 mg 1 tablet by mouth twice daily.  Advised to increase the dose of Plaquenil for the next 3 months.  Hydroxychloroquine drug level was 260.  We will check hydroxychloroquine level with the next labs.  I reviewed her records from [facility].  She had eye examination on June 03, 2021 at [facility] which was negative for ocular toxicity from Plaquenil.  Patient information about immunization was placed in the AVS.  Raynaud's phenomenon without gangrene-keeping core temperature warm and warm clothing was discussed.  Erythema nodosum-patient denies any recent lesions.  Chronic right shoulder pain-she has off-and-on discomfort.  No warmth swelling or effusion was noted.  Pain in left hip-she is off-and-on discomfort in her hip.  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  Pain in both feet-she is osteoarthritis involving bilateral first MTP.  Proper fitting shoes were discussed.  Hypermobility of joint - Diagnosed by Dr. Raeford Razor.   Other fatigue  S/P carpal tunnel release-she has intermittent symptoms.  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  No orders of the defined types were placed in this encounter.    Follow-Up Instructions: No follow-ups on file.   Bo Merino, MD  Note -  This record has been created using Editor, commissioning.  Chart creation errors have been sought, but may not always  have been located. Such creation errors do not reflect on  the standard of medical care.

## 2021-06-17 ENCOUNTER — Ambulatory Visit (INDEPENDENT_AMBULATORY_CARE_PROVIDER_SITE_OTHER): Payer: BC Managed Care – PPO | Admitting: *Deleted

## 2021-06-17 DIAGNOSIS — J309 Allergic rhinitis, unspecified: Secondary | ICD-10-CM

## 2021-06-22 ENCOUNTER — Encounter: Payer: Self-pay | Admitting: Rheumatology

## 2021-06-22 ENCOUNTER — Ambulatory Visit: Payer: BC Managed Care – PPO | Admitting: Rheumatology

## 2021-06-22 ENCOUNTER — Other Ambulatory Visit: Payer: Self-pay

## 2021-06-22 VITALS — BP 102/65 | HR 67 | Resp 14 | Ht 66.0 in | Wt 158.0 lb

## 2021-06-22 DIAGNOSIS — M3219 Other organ or system involvement in systemic lupus erythematosus: Secondary | ICD-10-CM | POA: Diagnosis not present

## 2021-06-22 DIAGNOSIS — M79672 Pain in left foot: Secondary | ICD-10-CM

## 2021-06-22 DIAGNOSIS — M25511 Pain in right shoulder: Secondary | ICD-10-CM

## 2021-06-22 DIAGNOSIS — Z79899 Other long term (current) drug therapy: Secondary | ICD-10-CM | POA: Diagnosis not present

## 2021-06-22 DIAGNOSIS — R5383 Other fatigue: Secondary | ICD-10-CM

## 2021-06-22 DIAGNOSIS — I7300 Raynaud's syndrome without gangrene: Secondary | ICD-10-CM

## 2021-06-22 DIAGNOSIS — M79671 Pain in right foot: Secondary | ICD-10-CM

## 2021-06-22 DIAGNOSIS — Z8669 Personal history of other diseases of the nervous system and sense organs: Secondary | ICD-10-CM

## 2021-06-22 DIAGNOSIS — Z8659 Personal history of other mental and behavioral disorders: Secondary | ICD-10-CM

## 2021-06-22 DIAGNOSIS — M249 Joint derangement, unspecified: Secondary | ICD-10-CM

## 2021-06-22 DIAGNOSIS — M25552 Pain in left hip: Secondary | ICD-10-CM

## 2021-06-22 DIAGNOSIS — M722 Plantar fascial fibromatosis: Secondary | ICD-10-CM

## 2021-06-22 DIAGNOSIS — R748 Abnormal levels of other serum enzymes: Secondary | ICD-10-CM

## 2021-06-22 DIAGNOSIS — M79641 Pain in right hand: Secondary | ICD-10-CM

## 2021-06-22 DIAGNOSIS — L52 Erythema nodosum: Secondary | ICD-10-CM

## 2021-06-22 DIAGNOSIS — Z9889 Other specified postprocedural states: Secondary | ICD-10-CM

## 2021-06-22 DIAGNOSIS — G8929 Other chronic pain: Secondary | ICD-10-CM

## 2021-06-22 DIAGNOSIS — M79642 Pain in left hand: Secondary | ICD-10-CM

## 2021-06-22 DIAGNOSIS — Z8639 Personal history of other endocrine, nutritional and metabolic disease: Secondary | ICD-10-CM

## 2021-06-22 DIAGNOSIS — K5000 Crohn's disease of small intestine without complications: Secondary | ICD-10-CM

## 2021-06-22 DIAGNOSIS — F900 Attention-deficit hyperactivity disorder, predominantly inattentive type: Secondary | ICD-10-CM

## 2021-06-22 DIAGNOSIS — M25562 Pain in left knee: Secondary | ICD-10-CM

## 2021-06-22 NOTE — Patient Instructions (Addendum)
Standing Labs We placed an order today for your standing lab work.   Please have your standing labs drawn in December  If possible, please have your labs drawn 2 weeks prior to your appointment so that the provider can discuss your results at your appointment.  Please note that you may see your imaging and lab results in [facility] before we have reviewed them. We may be awaiting multiple results to interpret others before contacting you. Please allow our office up to 72 hours to thoroughly review all of the results before contacting the office for clarification of your results.  We have open lab daily: Monday through Thursday from 1:30-4:30 PM and Friday from 1:30-4:00 PM at the office of Dr. Bo Merino, [facility].   Please be advised, all patients with office appointments requiring lab work will take precedent over walk-in lab work.  If possible, please come for your lab work on Monday and Friday afternoons, as you may experience shorter wait times. The office is located at [address], [place], [place], [place] [address] No appointment is necessary.   Labs are drawn by [facility]. Please bring your co-pay at the time of your lab draw.  You may receive a bill from [facility] for your lab work.  If you wish to have your labs drawn at another location, please call the office 24 hours in advance to send orders.  If you have any questions regarding directions or hours of operation,  please call [phone].   As a reminder, please drink plenty of water prior to coming for your lab work. Thanks!   Vaccines You are taking a medication(s) that can suppress your immune system.  The following immunizations are recommended: Flu annually Covid-19  Td/Tdap (tetanus, diphtheria, pertussis) every 10 years Pneumonia (Prevnar 15 then Pneumovax 23 at least 1 year apart.  Alternatively, can take Prevnar 20 without needing additional dose) Shingrix: 2 doses from 4 weeks to 6 months  apart  Please check with your PCP to make sure you are up to date.

## 2021-06-23 DIAGNOSIS — F331 Major depressive disorder, recurrent, moderate: Secondary | ICD-10-CM | POA: Diagnosis not present

## 2021-06-24 DIAGNOSIS — F411 Generalized anxiety disorder: Secondary | ICD-10-CM | POA: Diagnosis not present

## 2021-06-24 DIAGNOSIS — F339 Major depressive disorder, recurrent, unspecified: Secondary | ICD-10-CM | POA: Diagnosis not present

## 2021-06-24 DIAGNOSIS — F902 Attention-deficit hyperactivity disorder, combined type: Secondary | ICD-10-CM | POA: Diagnosis not present

## 2021-07-05 ENCOUNTER — Ambulatory Visit (INDEPENDENT_AMBULATORY_CARE_PROVIDER_SITE_OTHER): Payer: BC Managed Care – PPO

## 2021-07-05 DIAGNOSIS — J309 Allergic rhinitis, unspecified: Secondary | ICD-10-CM | POA: Diagnosis not present

## 2021-07-14 ENCOUNTER — Other Ambulatory Visit: Payer: Self-pay | Admitting: Family Medicine

## 2021-07-21 DIAGNOSIS — F331 Major depressive disorder, recurrent, moderate: Secondary | ICD-10-CM | POA: Diagnosis not present

## 2021-07-24 ENCOUNTER — Other Ambulatory Visit: Payer: Self-pay | Admitting: Rheumatology

## 2021-07-24 DIAGNOSIS — M3219 Other organ or system involvement in systemic lupus erythematosus: Secondary | ICD-10-CM

## 2021-07-26 NOTE — Telephone Encounter (Signed)
Next Visit: 09/23/2021  Last Visit: 06/22/2021  Labs: 04/07/2021 Alk. Phos. 30  Eye exam: 06/03/2021 WNL  Current Dose per office note 06/22/2021: Plaquenil 200 mg 1 tablet by mouth twice daily.   CC:EQFDV systemic lupus erythematosus with other organ involvement   Last Fill: 04/26/2021  Okay to refill Plaquenil?

## 2021-07-29 ENCOUNTER — Ambulatory Visit (INDEPENDENT_AMBULATORY_CARE_PROVIDER_SITE_OTHER): Payer: BC Managed Care – PPO

## 2021-07-29 DIAGNOSIS — F411 Generalized anxiety disorder: Secondary | ICD-10-CM | POA: Diagnosis not present

## 2021-07-29 DIAGNOSIS — F339 Major depressive disorder, recurrent, unspecified: Secondary | ICD-10-CM | POA: Diagnosis not present

## 2021-07-29 DIAGNOSIS — F902 Attention-deficit hyperactivity disorder, combined type: Secondary | ICD-10-CM | POA: Diagnosis not present

## 2021-07-29 DIAGNOSIS — J309 Allergic rhinitis, unspecified: Secondary | ICD-10-CM

## 2021-08-02 DIAGNOSIS — F331 Major depressive disorder, recurrent, moderate: Secondary | ICD-10-CM | POA: Diagnosis not present

## 2021-08-04 ENCOUNTER — Telehealth: Payer: Self-pay | Admitting: Family Medicine

## 2021-08-04 MED ORDER — MONTELUKAST SODIUM 10 MG PO TABS: ORAL_TABLET | ORAL | 2 refills | Status: DC

## 2021-08-04 NOTE — Telephone Encounter (Signed)
Sent in refill of montelukast  to [facility] [address]

## 2021-08-04 NOTE — Telephone Encounter (Signed)
Robin [name] would like a refill of SINGULAIR sent to a new pharmacy- [facility] on [address] [place].

## 2021-08-11 DIAGNOSIS — F331 Major depressive disorder, recurrent, moderate: Secondary | ICD-10-CM | POA: Diagnosis not present

## 2021-08-25 DIAGNOSIS — F331 Major depressive disorder, recurrent, moderate: Secondary | ICD-10-CM | POA: Diagnosis not present

## 2021-09-03 DIAGNOSIS — Z20822 Contact with and (suspected) exposure to covid-19: Secondary | ICD-10-CM | POA: Diagnosis not present

## 2021-09-08 DIAGNOSIS — F331 Major depressive disorder, recurrent, moderate: Secondary | ICD-10-CM | POA: Diagnosis not present

## 2021-09-09 NOTE — Progress Notes (Signed)
Office Visit Note  Patient: Robin [name]             Date of Birth: 26-Nov-1970           MRN: [id number]             PCP: Sandrea Hughs, NP Referring: Sandrea Hughs, NP Visit Date: 09/23/2021 Occupation: _0 @  Subjective:  Pain in multiple joints   History of Present Illness: Robin [name] is a 49 y.o. female with history of systemic lupus erythematosus.  She is taking plaquenil 200 mg 1 tablet by mouth twice daily.  The dose of Plaquenil was increased from twice daily Monday through Friday to twice daily every day in July 2022.  She initially noticed significant improvement in her joint pain and inflammation on the increased dose of Plaquenil.  Over the past couple of months she has been under increased stress caring for her mother who has since passed away.  Over the holidays she was traveling and out of her normal routine.  She has been experiencing increased pain in both hands and both feet.  She has noticed intermittent inflammation in her hands and feet.  She has also had symptoms of carpal tunnel in the left hand.  She plans on following up with Dr. Ernestina Patches and Dr. Erlinda Hong for further evaluation and management. She has difficulty walking prolonged distances due to the severity of pain in her feet after work.  She is also started to experience an increased discomfort in the left SI joint and would like a referral to physical therapy which has alleviated her symptoms in the past.  She currently feels more inflamed than usual. She has also been experiencing increased oral and nasal ulcerations.  She has not had any visible rashes.  She denies any increased hair loss.  She has not had any shortness of breath, pleuritic chest pain, or palpitations.  She has noticed some increased flushing in her hands but is unsure if it is related to Raynaud's.  She denies any digital ulcerations but has had some cracking of the skin on her thumbs. She has some ongoing gastrointestinal bloating and  has a follow up with GI tomorrow.    Activities of Daily Living:  Patient reports morning stiffness for 2 hours.   Patient Reports nocturnal pain.  Difficulty dressing/grooming: Reports Difficulty climbing stairs: Denies Difficulty getting out of chair: Denies Difficulty using hands for taps, buttons, cutlery, and/or writing: Reports  Review of Systems  Constitutional:  Positive for fatigue.  HENT:  Positive for mouth dryness. Negative for mouth sores and nose dryness.   Eyes:  Positive for dryness. Negative for pain and visual disturbance.  Respiratory:  Negative for cough, hemoptysis and difficulty breathing.   Cardiovascular:  Positive for swelling in legs/feet. Negative for chest pain, palpitations and hypertension.  Gastrointestinal:  Positive for constipation. Negative for blood in stool and diarrhea.  Musculoskeletal:  Positive for joint pain, gait problem, joint pain, joint swelling, muscle weakness, morning stiffness and muscle tenderness. Negative for myalgias and myalgias.  Skin:  Positive for rash. Negative for color change, pallor, hair loss, nodules/bumps, skin tightness, ulcers and sensitivity to sunlight.  Neurological:  Positive for numbness. Negative for dizziness and headaches.  Hematological:  Positive for bruising/bleeding tendency. Negative for swollen glands.  Psychiatric/Behavioral:  Positive for sleep disturbance. Negative for depressed mood. The patient is not nervous/anxious.    PMFS History:  Patient Active Problem List   Diagnosis Date Noted  Flushing 04/14/2021   Hypermobility syndrome 11/27/2020   Seasonal and perennial allergic rhinitis 11/06/2018   Mild intermittent asthma without complication 52/17/4715   Flexural atopic dermatitis 11/06/2018   History of hyperlipidemia 11/08/2016   History of anxiety 11/08/2016   History of retinal tear 11/08/2016   History of Bilateral plantar fasciitis 08/10/2016   High risk medication use 07/04/2016   Lupus  ([place]) 05/23/2016   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[place]) 03/18/2016   ADHD (attention deficit hyperactivity disorder) 12/22/2014   Raynaud's phenomenon 10/17/2013   Hyperlipidemia 09/28/2011    Past Medical History:  Diagnosis Date   ADD (attention deficit disorder)    Allergy    Anemia    Anorexia nervosa with bulimia    Per [facility] Patient Packet   Anxiety    Asthma    Carpal tunnel syndrome    Per [facility] Patient Packet   Depression    Dry eyes    Per [facility] [facility] Patient Packet   Dry mouth    Per [place] [facility] Patient Packet   Fibroids    Per [facility] [place] [facility] Patient Packet   History of hip x-ray 2021   Per [facility] Packet   History of Papanicolaou smear of cervix 2021   Per [facility] Patient Packet, Dr.Pickens   Hyperlipidemia    Ileitis    Lupus ([place])    PONV (postoperative nausea and vomiting)    mild nausea    Raynaud disease    Retinal tear    Per [place] [facility] Patient Packet   Systemic lupus erythematosus ([place])     Family History  Problem Relation Age of Onset   Heart disease Mother 94       CAD/stenting   Hyperlipidemia Mother    Arthritis Mother        ostearthritis   Hypertension Mother    Kidney disease Mother    Irritable bowel syndrome Mother    Stroke Mother 50       CVA   Fibromyalgia Mother    Eczema Mother    Sjogren's syndrome Mother    Hyperlipidemia Father    Neuropathy Father    Allergies Father        shellfish   Prostatitis Father    Hyperlipidemia Sister    ADD / ADHD Sister    Bipolar disorder Sister    Depression Sister    Hypertension Brother    OCD Brother    Anxiety disorder Daughter    ADD / ADHD Daughter    Asthma Daughter    OCD Son    Colon cancer Neg Hx    Inflammatory bowel disease Neg Hx    Allergic rhinitis Neg Hx    Angioedema Neg Hx    Atopy Neg Hx    Immunodeficiency Neg Hx    Urticaria Neg Hx    Past Surgical History:  Procedure Laterality Date   CARPAL TUNNEL RELEASE Right    08/2019   CESAREAN SECTION  2003   CESAREAN SECTION   2005   Per [facility] Patient Packet   COLONOSCOPY  2017   Dr.Jacobs   DENTAL SURGERY  2009   Fromed tooth and chronic infection   DILITATION & CURRETTAGE/HYSTROSCOPY WITH NOVASURE ABLATION N/A 06/03/2020   Procedure: DILATATION & CURETTAGE/HYSTEROSCOPY WITH NOVASURE ABLATION;  Surgeon: Aletha Halim, MD;  Location: [place];  Service: Gynecology;  Laterality: N/A;   ENDOMETRIAL BIOPSY  04/20/2020       EYE SURGERY Bilateral  cryoretinopexy   Social History   Social History Narrative   Marital status: married x 17 years; happily married      Children: 2 children (72, 33)      Lives: with husband, 2 children.      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 4 (including me)       Pets: 1 dog, 2 cats       Highest level or education completed: Undergraduate degree       Current/Past profession: Actuary       Exercise:        Yes          Type and how often: 30-60 min walking 5 days a week. Pilate's 1 x a week, and horseback riding 2 times a week          Living Will: No   DNR: no, would like to discuss one    POA/HPOA: No      Functional Status:   Do you have difficulty bathing or dressing yourself? Left blank    Do you have difficulty preparing food or eating? Left blank   Do you have difficulty managing your medications? Left blank   Do you have difficulty managing your finances? Left blank   Do you have difficulty affording your medications? Left blank    Immunization History  Administered Date(s) Administered   Influenza,inj,Quad PF,6+ Mos 05/12/2014, 11/27/2015, 05/23/2016, 10/30/2017, 07/30/2018   Moderna Sars-Covid-2 Vaccination 10/27/2020, 10/31/2020   PFIZER(Purple Top)SARS-COV-2 Vaccination 12/06/2019, 12/16/2019, 05/27/2020   Pneumococcal Conjugate-13 05/23/2016   Tdap 09/28/2011, 08/12/2020     Objective: Vital Signs: BP 133/85 (BP Location: Left Arm, Patient Position: Sitting, Cuff Size: Small)    Pulse 81    Resp 12    Ht _0  (1.676 m)    Wt 156 lb (70.8 kg)    BMI 25.18 kg/m    Physical Exam Vitals and nursing note reviewed.  Constitutional:      Appearance: She is well-developed.  HENT:     Head: Normocephalic and atraumatic.  Eyes:     Conjunctiva/sclera: Conjunctivae normal.  Cardiovascular:     Rate and Rhythm: Normal rate and regular rhythm.     Heart sounds: Normal heart sounds.  Pulmonary:     Effort: Pulmonary effort is normal.     Breath sounds: Normal breath sounds.  Abdominal:     General: Bowel sounds are normal.     Palpations: Abdomen is soft.  Musculoskeletal:     Cervical back: Normal range of motion.  Lymphadenopathy:     Cervical: No cervical adenopathy.  Skin:    General: Skin is warm and dry.     Capillary Refill: Capillary refill takes 2 to 3 seconds.  Neurological:  Mental Status: She is alert and oriented to person, place, and time.  Psychiatric:        Behavior: Behavior normal.     Musculoskeletal Exam: C-spine, thoracic spine, and lumbar spine have good ROM.  No midline spinal tenderness.  Tenderness over the left SI joint.  Shoulder joints, elbow joints, wrist joints, MCPs, PIPs, and DIPs good ROM with no synovitis.  Tenderness over right 1st-3rd MCP joints. Complete fist formation bilaterally. PIP and DIP thickening noted.  Hip joints have good ROM with no groin pain.  Some tenderness over trochanteric bursa bilaterally.  Knee joints have good ROM with no warmth or effusion.   Ankle joints have good ROM with some tenderness but no synovitis. Tenderness over MTP joints.   CDAI Exam: CDAI Score: -- Patient Global: --; Provider Global: -- Swollen: 1 ; Tender: 17  Joint Exam 09/23/2021      Right  Left  MCP 1   Tender     MCP 2  Swollen Tender   Tender  MCP 3   Tender   Tender  PIP 2   Tender     PIP 3   Tender     Sacroiliac      Tender  Ankle   Tender   Tender  MTP 1   Tender   Tender  MTP 2   Tender   Tender  MTP 3   Tender     MTP 5   Tender   Tender     Investigation: No additional findings.  Imaging: No results found.  Recent Labs: Lab Results  Component Value Date   WBC 5.8 04/07/2021   HGB 13.4 04/07/2021   PLT 302 04/07/2021   NA 136 04/07/2021   K 4.5 04/07/2021   CL 102 04/07/2021   CO2 28 04/07/2021   GLUCOSE 80 04/07/2021   BUN 11 04/07/2021   CREATININE 0.91 04/07/2021   BILITOT 0.6 04/07/2021   ALKPHOS 26 (L) 06/01/2020   AST 19 04/07/2021   ALT 17 04/07/2021   PROT 6.9 04/07/2021   ALBUMIN 3.8 06/01/2020   CALCIUM 9.6 04/07/2021   GFRAA 93 01/26/2021    Speciality Comments: PLQ Eye Exam: 9/22/ 2022 WNL @ [facility] (in [place]) Follow up in 1 year.  ANA 1:320 speckled, history of oral ulcers, lymphadenopathy, photosensitivity, Raynauds, erythema nodosum, inflammatory arthritis.  Procedures:  No procedures performed Allergies: Avelox [moxifloxacin hcl in nacl], Cefaclor, Dust mite extract, Latex, Mold extract [trichophyton], Red wine complex [germanium], Meloxicam, and Naproxen     Assessment / Plan:     Visit Diagnoses: Other systemic lupus erythematosus with other organ involvement (HCC) - ANA 1:320 speckled, history of oral ulcers, lymphadenopathy, photosensitivity, Raynauds, erythema nodosum, inflammatory arthritis: She presents today with an exacerbation of joint pain and inflammation in her hands and feet.  She has tenderness and inflammation involving multiple joints in both hands and  both feet.  She has also had increased frequency of oral and nasal ulcerations.  She is also noticed increased flushing and itching of her skin as well as increased fatigue.  She has not had any shortness of breath, pleuritic chest pain, or palpitations.  Her lungs were clear to auscultation on examination today and her heart rate was regular rate and rhythm. She has been taking Plaquenil 200 mg 1 tablet by mouth twice daily.  She increased the dose of Plaquenil in July 2022 and initially noticed significant improvement on the increased dose.  Over the past  couple of months she has been under increased stress since her mother passed away.  She was traveling over the holidays which also got her out of her routine.  She missed about 1 week of Plaquenil about a month ago due to requiring a refill. Overall the past several weeks she has been experiencing increased generalized inflammation.  Lab work from 03/23/21 was reviewed today in the office: dsDNA is negative, complements WNL, and ESR WNL.  The following lab work will be updated today to assess for a flare. She will remain on the current dose of Plaquenil.  A prednisone taper starting at 20 mg tapering by 5 mg every 4 days was sent to the pharmacy.  She was advised to notify us if her symptoms persist or worsen.  She will follow-up in the office in 3 months. - Plan: Protein / creatinine ratio, urine, CBC with Differential/Platelet, COMPLETE METABOLIC PANEL WITH GFR, ANA, Anti-DNA antibody, double-stranded, C3 and C4, Sedimentation rate, predniSONE (DELTASONE) 5 MG tablet, VITAMIN D 25 Hydroxy (Vit-D Deficiency, Fractures), Hydroxychloroquine, Blood  High risk medication use - Plaquenil 200 mg 1 tablet by mouth twice daily. PLQ Eye Exam: 06/03/2021 WNL @ [facility] (in [place]) Follow up in 1 year.  CBC and CMP were drawn on 03/23/2021.  CBC and CMP will be updated today. - Plan: CBC with Differential/Platelet, COMPLETE METABOLIC PANEL  WITH GFR, Hydroxychloroquine, Blood  Raynaud's phenomenon without gangrene - Delayed capillary refill 2-3 seconds.  Some erythema over both hands was noted on examination today.  She has not noticed as much cold intolerance with the cooler weather as she has in the past.  Discussed the importance of keeping her core body temperature warm as well as wearing gloves and thick socks.  Discussed the importance of avoiding triggers.  She was advised to notify us if she develops any new or worsening symptoms.  Erythema nodosum - No recurrence.  No EN lesions noted on examination.   Chronic right shoulder pain - Improved.  She has intermittent discomfort in the right shoulder, but she has good ROM of examination today with no tenderness to palpation.   Primary osteoarthritis of both hands - XR of both hands 03/23/21: early osteoarthritic changes.  She presents today with increased pain and inflammation in both hands.  She has noticed some decreased grip strength in her right hand due to stiffness she has been experiencing.  Discussed the importance of joint protection and muscle strengthening.  A prednisone taper sent to the pharmacy as discussed above.  Primary osteoarthritis of both feet - Osteoarthritis involving bilateral first MTP.  She is been experiencing increased pain in both feet and has difficulty walking prolonged distances due to the severity of pain.  She has been wearing proper fitting shoes but it does not seem to be making a difference in regards to allowing her to walk longer than since.  Hypermobility of joint - Diagnosed by Dr. Raeford Razor.  Referral to physical therapy was placed today.- Plan: Ambulatory referral to Physical Therapy  Other fatigue: Chronic. She has had some increased fatigue over the past couple of months.  Paresthesias in left hand - She has been experiencing increased paresthesias in the left hand especially at night.  She has not been wearing a carpal tunnel night splint  recently.  She plans on scheduling an appointment with Dr. Ernestina Patches for NCV with EMG for further evaluation.  If she is diagnosed with carpal tunnel syndrome like she had on the right  side she plans on following up with Dr. Erlinda Hong to discuss surgical intervention.  S/P carpal tunnel release - Right-Performed by Dr. Erlinda Hong. Asymptomatic at this time.   Chronic left SI joint pain - She has been experiencing increased pain in the left SI joint.  She has tenderness palpation over the left SI joint on examination today.  She requested a referral to PT for further evaluation and management.  Referral placed today.- Plan: Ambulatory referral to Physical Therapy  Vitamin D deficiency - She has history of vitamin D deficiency.  She has not been taking a maintenance dose of vitamin D recently.  Vitamin D level will be rechecked today to advise her of a recommended dose of vitamin D.  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   Other medical conditions are listed as follows:   History of hyperlipidemia  Attention deficit hyperactivity disorder (ADHD), predominantly inattentive type  History of retinal tear  History of anxiety  Orders: Orders Placed This Encounter  Procedures   Protein / creatinine ratio, urine   CBC with Differential/Platelet   COMPLETE METABOLIC PANEL WITH GFR   ANA   Anti-DNA antibody, double-stranded   C3 and C4   Sedimentation rate   VITAMIN D 25 Hydroxy (Vit-D Deficiency, Fractures)   Hydroxychloroquine, Blood   Ambulatory referral to Physical Therapy   Meds ordered this encounter  Medications   predniSONE (DELTASONE) 5 MG tablet    Sig: Take 4 tablets by mouth daily x4 days, 3 tablets daily x4 days, 2 tablets daily x4 days, 1 tablet daily x4 days.    Dispense:  40 tablet    Refill:  0     Follow-Up Instructions:  Return in about 3 months (around 12/22/2021) for Systemic lupus erythematosus.   Ofilia Neas, PA-C  Note - This record has been created using Dragon software.  Chart creation errors have been sought, but may not always  have been located. Such creation errors do not reflect on  the standard of medical care.

## 2021-09-12 HISTORY — PX: OTHER SURGICAL HISTORY: SHX169

## 2021-09-13 DIAGNOSIS — Z03818 Encounter for observation for suspected exposure to other biological agents ruled out: Secondary | ICD-10-CM | POA: Diagnosis not present

## 2021-09-13 DIAGNOSIS — Z20828 Contact with and (suspected) exposure to other viral communicable diseases: Secondary | ICD-10-CM | POA: Diagnosis not present

## 2021-09-13 DIAGNOSIS — U071 COVID-19: Secondary | ICD-10-CM | POA: Diagnosis not present

## 2021-09-14 ENCOUNTER — Ambulatory Visit (INDEPENDENT_AMBULATORY_CARE_PROVIDER_SITE_OTHER): Payer: BC Managed Care – PPO | Admitting: *Deleted

## 2021-09-14 DIAGNOSIS — J309 Allergic rhinitis, unspecified: Secondary | ICD-10-CM | POA: Diagnosis not present

## 2021-09-20 DIAGNOSIS — Z03818 Encounter for observation for suspected exposure to other biological agents ruled out: Secondary | ICD-10-CM | POA: Diagnosis not present

## 2021-09-20 DIAGNOSIS — Z20828 Contact with and (suspected) exposure to other viral communicable diseases: Secondary | ICD-10-CM | POA: Diagnosis not present

## 2021-09-20 DIAGNOSIS — U071 COVID-19: Secondary | ICD-10-CM | POA: Diagnosis not present

## 2021-09-22 DIAGNOSIS — F331 Major depressive disorder, recurrent, moderate: Secondary | ICD-10-CM | POA: Diagnosis not present

## 2021-09-23 ENCOUNTER — Other Ambulatory Visit: Payer: Self-pay

## 2021-09-23 ENCOUNTER — Encounter: Payer: Self-pay | Admitting: Physician Assistant

## 2021-09-23 ENCOUNTER — Ambulatory Visit: Payer: BC Managed Care – PPO | Admitting: Physician Assistant

## 2021-09-23 VITALS — BP 133/85 | HR 81 | Resp 12 | Ht 66.0 in | Wt 156.0 lb

## 2021-09-23 DIAGNOSIS — Z79899 Other long term (current) drug therapy: Secondary | ICD-10-CM

## 2021-09-23 DIAGNOSIS — Z8669 Personal history of other diseases of the nervous system and sense organs: Secondary | ICD-10-CM

## 2021-09-23 DIAGNOSIS — Z8659 Personal history of other mental and behavioral disorders: Secondary | ICD-10-CM

## 2021-09-23 DIAGNOSIS — M19042 Primary osteoarthritis, left hand: Secondary | ICD-10-CM

## 2021-09-23 DIAGNOSIS — R5383 Other fatigue: Secondary | ICD-10-CM

## 2021-09-23 DIAGNOSIS — M19071 Primary osteoarthritis, right ankle and foot: Secondary | ICD-10-CM

## 2021-09-23 DIAGNOSIS — Z9889 Other specified postprocedural states: Secondary | ICD-10-CM

## 2021-09-23 DIAGNOSIS — M249 Joint derangement, unspecified: Secondary | ICD-10-CM

## 2021-09-23 DIAGNOSIS — R202 Paresthesia of skin: Secondary | ICD-10-CM

## 2021-09-23 DIAGNOSIS — E559 Vitamin D deficiency, unspecified: Secondary | ICD-10-CM | POA: Diagnosis not present

## 2021-09-23 DIAGNOSIS — M19041 Primary osteoarthritis, right hand: Secondary | ICD-10-CM

## 2021-09-23 DIAGNOSIS — M25511 Pain in right shoulder: Secondary | ICD-10-CM

## 2021-09-23 DIAGNOSIS — M3219 Other organ or system involvement in systemic lupus erythematosus: Secondary | ICD-10-CM

## 2021-09-23 DIAGNOSIS — M533 Sacrococcygeal disorders, not elsewhere classified: Secondary | ICD-10-CM

## 2021-09-23 DIAGNOSIS — K5000 Crohn's disease of small intestine without complications: Secondary | ICD-10-CM

## 2021-09-23 DIAGNOSIS — F900 Attention-deficit hyperactivity disorder, predominantly inattentive type: Secondary | ICD-10-CM

## 2021-09-23 DIAGNOSIS — L52 Erythema nodosum: Secondary | ICD-10-CM

## 2021-09-23 DIAGNOSIS — Z8639 Personal history of other endocrine, nutritional and metabolic disease: Secondary | ICD-10-CM

## 2021-09-23 DIAGNOSIS — G8929 Other chronic pain: Secondary | ICD-10-CM

## 2021-09-23 DIAGNOSIS — I7300 Raynaud's syndrome without gangrene: Secondary | ICD-10-CM | POA: Diagnosis not present

## 2021-09-23 DIAGNOSIS — M19072 Primary osteoarthritis, left ankle and foot: Secondary | ICD-10-CM

## 2021-09-23 MED ORDER — PREDNISONE 5 MG PO TABS: ORAL_TABLET | ORAL | 0 refills | Status: DC

## 2021-09-24 ENCOUNTER — Encounter: Payer: Self-pay | Admitting: Nurse Practitioner

## 2021-09-24 ENCOUNTER — Ambulatory Visit: Payer: BC Managed Care – PPO | Admitting: Nurse Practitioner

## 2021-09-24 VITALS — BP 126/72 | HR 80 | Ht 66.0 in | Wt 156.0 lb

## 2021-09-24 DIAGNOSIS — R150 Incomplete defecation: Secondary | ICD-10-CM | POA: Diagnosis not present

## 2021-09-24 DIAGNOSIS — R140 Abdominal distension (gaseous): Secondary | ICD-10-CM

## 2021-09-24 MED ORDER — DICYCLOMINE HCL 10 MG PO CAPS: 10.0000 mg | ORAL_CAPSULE | Freq: Two times a day (BID) | ORAL | 1 refills | Status: DC | PRN

## 2021-09-24 NOTE — Patient Instructions (Addendum)
You have been given a testing kit to check for small intestine bacterial overgrowth (SIBO) which is completed by a company named [facility].   Make sure to return your test in the mail using the return mailing label given to you along with the kit. Your demographic and insurance information have already been sent to the company and they should be in contact with you over the next week regarding this test.   [facility] will collect an upfront charge of $99.74 for commercial insurance plans and $209.74 is you are paying cash. Make sure to discuss with [facility] PRIOR to having the test if they have gotten informatoin from your insurance company as to how much your testing will cost out of pocket, if any. Please keep in mind that you will be getting a call from phone number [phone] or a similar number. If you do not hear from them within this time frame, please call our office at [phone].   MEDICATION: We have sent the following medication to your pharmacy for you to pick up at your convenience: Dicyclomine 10 MG tablet twice a day as needed for abdominal pain.  AMB referral to rehabilitation  Where: Outpatient Rehabilitation at [facility] Address: [address] [place] [address] Phone: [phone] Expires: 09/24/2022   It was great seeing you today! Thank you for entrusting me with your care and choosing [facility].  Noralyn Pick, CRNP  The [facility] providers would like to encourage you to use [facility] to communicate with providers for non-urgent requests or questions.  Due to long hold times on the telephone, sending your provider a message by [facility] may be faster and more efficient way to get a response. Please allow 48 business hours for a response.  Please remember that this is for non-urgent requests/questions.  If you are age 32 or older, your body mass index should be between 23-30. Your Body mass index is  25.18 kg/m. If this is out of the aforementioned range listed, please consider follow up with your Primary Care Provider.  If you are age 15 or younger, your body mass index should be between 19-25. Your Body mass index is 25.18 kg/m. If this is out of the aformentioned range listed, please consider follow up with your Primary Care Provider.

## 2021-09-24 NOTE — Progress Notes (Signed)
dsDNA is negative

## 2021-09-24 NOTE — Progress Notes (Signed)
No proteinuria.   Alk phos is low-stable. Rest of CMP WNL.  CBC WNL.   ESR WNL. Vitamin D WNL. Please advise the patient to take a maintenance dose of vitamin D daily.  Complements WNL.

## 2021-09-24 NOTE — Progress Notes (Signed)
I agree with the above note, plan 

## 2021-09-24 NOTE — Progress Notes (Signed)
09/24/2021 Robin [name] [id number] [birth year]   Chief Complaint: Follow up abdominal bloat   History of Present Illness: Robin [name] is a 51 year old female with a past medical history of anxiety, depression, remote anorexia/bulimia as a teenager, asthma, anemia, hyperlipidemia, Raynaud's disease, erythema nodosum and Lupus on Plaquenil. S/P C section x 2.  I last saw patient in office in 05/03/2021 for further evaluation regarding elevated LFTs, likely due to a viral illness. Her LFTs normalized. She was also having abdominal bloat symptoms and she as prescribed Miralax, a probiotic, Ibgard, a FODMAP diet and a SIBO test was ordered.  She presents to our office today for further follow-up.  She took MiraLAX which resulted stool just falling out, she did not feel emptied and sat in the bathroom multiple times day to pass small amounts of stool which was unfavorable so she stopped taking it.  She has noticed difficulty passing stool from the rectum even when not on MiraLAX and questions if she has pelvic floor dysfunction.  Citrucel resulted in bulking up her stool without experiencing excessive flatulence.  She is taking a probiotic daily as well.  As long as she takes fiber, a probiotic, drinks plenty of water and exercises she has a fairly normal bowel movement.  No rectal bleeding or black stools.  She was taking IBgard which reduced her abdominal bloat and generalized tenderness but she recently ran out of her supply.  A SIBO breath test was ordered at the time of her last office visit, however, she did not complete this test.  She wishes to proceed with the SIBO test at this time.  Her stress level has been elevated, she is grieving the loss of her mother who passed away 08-21-2021.  She was seen by her rheumatologist yesterday due to having a lupus flare.  She was prescribed prednisone which she intends to start today.  She took ibuprofen 200 mg 2 tabs 4 times yesterday for migraine headache.   She typically tries to avoid NSAIDs.  She has had mild nausea for the past week or 2 with intermittent upper abdominal fullness.  No vomiting.  She has generalized abdominal tenderness, no significant abdominal pain.  No fever.  No weight loss.  Colonoscopy 04/08/2016: Benign lymphoid hyperplasia at the TI, no evidence of active ileitis/IBD. No polyps    CBC Latest Ref Rng & Units 09/23/2021 04/07/2021 03/23/2021  WBC 3.8 - 10.8 Thousand/uL 4.6 5.8 7.5  Hemoglobin 11.7 - 15.5 g/dL 14.0 13.4 13.6  Hematocrit 35.0 - 45.0 % 42.6 39.3 41.7  Platelets 140 - 400 Thousand/uL 295 302 290    CMP Latest Ref Rng & Units 09/23/2021 04/07/2021 03/23/2021  Glucose 65 - 99 mg/dL 75 80 89  BUN 7 - 25 mg/dL 13 11 19   Creatinine 0.50 - 0.99 mg/dL 0.83 0.91 0.95  Sodium 135 - 146 mmol/L 138 136 137  Potassium 3.5 - 5.3 mmol/L 4.6 4.5 4.6  Chloride 98 - 110 mmol/L 101 102 101  CO2 20 - 32 mmol/L 31 28 27   Calcium 8.6 - 10.2 mg/dL 10.0 9.6 9.8  Total Protein 6.1 - 8.1 g/dL 7.6 6.9 7.5  Total Bilirubin 0.2 - 1.2 mg/dL 0.6 0.6 0.4  Alkaline Phos 38 - 126 U/L - - -  AST 10 - 35 U/L 22 19 58(H)  ALT 6 - 29 U/L 17 17 100(H)     Current Medications, Allergies, Past Medical History, Past Surgical History, Family History and Social History  were reviewed in [facility] record.   Review of Systems:   Constitutional: Negative for fever, sweats, chills or weight loss.  Respiratory: Negative for shortness of breath.   Cardiovascular: Negative for chest pain, palpitations and leg swelling.  Gastrointestinal: See HPI.  Musculoskeletal: + Lupus related aches and pains. Neurological: Negative for dizziness, headaches or paresthesias.    Physical Exam: BP 126/72    Pulse 80    Ht 5' 6"  (1.676 m)    Wt 156 lb (70.8 kg)    BMI 25.18 kg/m   General: 51 year old female in no acute distress Head: Normocephalic and atraumatic. Eyes: No scleral icterus. Conjunctiva pink . Ears: Normal auditory  acuity. Lungs: Clear throughout to auscultation. Heart: Regular rate and rhythm, no murmur. Abdomen: Soft, nondistended.  Mild generalized abdominal tenderness throughout the exam and except the RUQ was nontender.  No rebound or guarding.  No masses or hepatomegaly. Normal bowel sounds x 4 quadrants.  No abdominal bruits. Rectal: Deferred. Musculoskeletal: Symmetrical with no gross deformities. Extremities: No edema. Neurological: Alert oriented x 4. No focal deficits.  Psychological: Alert and cooperative. Normal mood and affect  Assessment and Recommendations:.     1) Chronic abdominal bloat with intermittent generalized abdominal pain -SIBO breath test -Continue probiotic of choice -Continue IBgard 1 capsule 2-3 times daily as needed -Dicyclomine 10 mg 1 p.o. twice daily as needed for abdominal pain -CTAP with abdominal bloat/generalized abdominal pain persists or worsens -Follow-up as needed  2) Mild nausea without vomiting.  Normal LFTs per labs done 09/23/2021. -Recommended Omeprazole 20 mg p.o. daily to be taken for the next few weeks and while she takes Prednisone -Consider abdominal sonogram to evaluate the gallbladder if nausea persists or worsens   3) Colon cancer screening. Colonoscopy 7/28/217 showed benign lymphoid hyperplasia at the TI, no evidence of active ileitis/IBD. No polyps -Next colonoscopy due 03/2026  4) Intermittent incomplete stool evacuation, questionable pelvic floor dysfunction -For to pelvic floor physical therapy

## 2021-09-27 LAB — C3 AND C4
C3 Complement: 85 mg/dL (ref 83–193)
C4 Complement: 24 mg/dL (ref 15–57)

## 2021-09-27 LAB — CBC WITH DIFFERENTIAL/PLATELET
Absolute Monocytes: 386 cells/uL (ref 200–950)
Basophils Absolute: 28 cells/uL (ref 0–200)
Basophils Relative: 0.6 %
Eosinophils Absolute: 101 cells/uL (ref 15–500)
Eosinophils Relative: 2.2 %
HCT: 42.6 % (ref 35.0–45.0)
Hemoglobin: 14 g/dL (ref 11.7–15.5)
Lymphs Abs: 1633 cells/uL (ref 850–3900)
MCH: 31.8 pg (ref 27.0–33.0)
MCHC: 32.9 g/dL (ref 32.0–36.0)
MCV: 96.8 fL (ref 80.0–100.0)
MPV: 9.5 fL (ref 7.5–12.5)
Monocytes Relative: 8.4 %
Neutro Abs: 2452 cells/uL (ref 1500–7800)
Neutrophils Relative %: 53.3 %
Platelets: 295 10*3/uL (ref 140–400)
RBC: 4.4 10*6/uL (ref 3.80–5.10)
RDW: 12.9 % (ref 11.0–15.0)
Total Lymphocyte: 35.5 %
WBC: 4.6 10*3/uL (ref 3.8–10.8)

## 2021-09-27 LAB — COMPLETE METABOLIC PANEL WITH GFR
AG Ratio: 1.8 (calc) (ref 1.0–2.5)
ALT: 17 U/L (ref 6–29)
AST: 22 U/L (ref 10–35)
Albumin: 4.9 g/dL (ref 3.6–5.1)
Alkaline phosphatase (APISO): 27 U/L — ABNORMAL LOW (ref 31–125)
BUN: 13 mg/dL (ref 7–25)
CO2: 31 mmol/L (ref 20–32)
Calcium: 10 mg/dL (ref 8.6–10.2)
Chloride: 101 mmol/L (ref 98–110)
Creat: 0.83 mg/dL (ref 0.50–0.99)
Globulin: 2.7 g/dL (calc) (ref 1.9–3.7)
Glucose, Bld: 75 mg/dL (ref 65–99)
Potassium: 4.6 mmol/L (ref 3.5–5.3)
Sodium: 138 mmol/L (ref 135–146)
Total Bilirubin: 0.6 mg/dL (ref 0.2–1.2)
Total Protein: 7.6 g/dL (ref 6.1–8.1)
eGFR: 87 mL/min/{1.73_m2} (ref >=60)

## 2021-09-27 LAB — ANTI-NUCLEAR AB-TITER (ANA TITER): ANA Titer 1: 1:40 {titer} — ABNORMAL HIGH

## 2021-09-27 LAB — PROTEIN / CREATININE RATIO, URINE
Creatinine, Urine: 59 mg/dL (ref 20–275)
Protein/Creat Ratio: 68 mg/g creat (ref 24–184)
Protein/Creatinine Ratio: 0.068 mg/mg creat (ref 0.024–0.184)
Total Protein, Urine: 4 mg/dL — ABNORMAL LOW (ref 5–24)

## 2021-09-27 LAB — ANA: Anti Nuclear Antibody (ANA): POSITIVE — AB

## 2021-09-27 LAB — ANTI-DNA ANTIBODY, DOUBLE-STRANDED: ds DNA Ab: 1 IU/mL

## 2021-09-27 LAB — SEDIMENTATION RATE: Sed Rate: 2 mm/h (ref 0–20)

## 2021-09-27 LAB — VITAMIN D 25 HYDROXY (VIT D DEFICIENCY, FRACTURES): Vit D, 25-Hydroxy: 36 ng/mL (ref 30–100)

## 2021-09-28 ENCOUNTER — Other Ambulatory Visit: Payer: Self-pay | Admitting: Family Medicine

## 2021-09-28 NOTE — Progress Notes (Signed)
Complements WNL.  ANA remains positive-low titer.

## 2021-09-29 ENCOUNTER — Ambulatory Visit (INDEPENDENT_AMBULATORY_CARE_PROVIDER_SITE_OTHER): Payer: BC Managed Care – PPO

## 2021-09-29 DIAGNOSIS — J309 Allergic rhinitis, unspecified: Secondary | ICD-10-CM | POA: Diagnosis not present

## 2021-10-01 ENCOUNTER — Other Ambulatory Visit: Payer: Self-pay

## 2021-10-06 DIAGNOSIS — J3089 Other allergic rhinitis: Secondary | ICD-10-CM | POA: Diagnosis not present

## 2021-10-06 DIAGNOSIS — F331 Major depressive disorder, recurrent, moderate: Secondary | ICD-10-CM | POA: Diagnosis not present

## 2021-10-06 NOTE — Progress Notes (Signed)
VIALS 10-06-22

## 2021-10-07 ENCOUNTER — Ambulatory Visit: Payer: BC Managed Care – PPO | Admitting: Family

## 2021-10-08 ENCOUNTER — Ambulatory Visit (INDEPENDENT_AMBULATORY_CARE_PROVIDER_SITE_OTHER): Payer: BC Managed Care – PPO

## 2021-10-08 DIAGNOSIS — J309 Allergic rhinitis, unspecified: Secondary | ICD-10-CM

## 2021-10-11 ENCOUNTER — Ambulatory Visit: Payer: BC Managed Care – PPO | Attending: Physician Assistant | Admitting: Physical Therapy

## 2021-10-11 ENCOUNTER — Other Ambulatory Visit: Payer: Self-pay

## 2021-10-11 DIAGNOSIS — G8929 Other chronic pain: Secondary | ICD-10-CM | POA: Insufficient documentation

## 2021-10-11 DIAGNOSIS — M2550 Pain in unspecified joint: Secondary | ICD-10-CM | POA: Diagnosis not present

## 2021-10-11 DIAGNOSIS — M533 Sacrococcygeal disorders, not elsewhere classified: Secondary | ICD-10-CM | POA: Diagnosis not present

## 2021-10-11 DIAGNOSIS — M249 Joint derangement, unspecified: Secondary | ICD-10-CM | POA: Insufficient documentation

## 2021-10-11 DIAGNOSIS — M357 Hypermobility syndrome: Secondary | ICD-10-CM | POA: Insufficient documentation

## 2021-10-11 DIAGNOSIS — M5417 Radiculopathy, lumbosacral region: Secondary | ICD-10-CM | POA: Diagnosis not present

## 2021-10-11 NOTE — Therapy (Signed)
OUTPATIENT PHYSICAL THERAPY THORACOLUMBAR EVALUATION   Patient Name: Robin [name] MRN: [id number] DOB:02-11-1971, 51 y.o., female Today's Date: 10/12/2021   PT End of Session - 10/11/21 0938     Visit Number 1    Number of Visits 12    Date for PT Re-Evaluation 11/22/21    Authorization Type [employer]    PT Start Time 0935    PT Stop Time 1014    PT Time Calculation (min) 39 min    Activity Tolerance Patient tolerated treatment well    Behavior During Therapy WFL for tasks assessed/performed             Past Medical History:  Diagnosis Date   ADD (attention deficit disorder)    Allergy    Anemia    Anorexia nervosa with bulimia    Per [place] [facility] Patient Packet   Anxiety    Asthma    Carpal tunnel syndrome    Per [facility] Patient Packet   Depression    Dry eyes    Per [place] New Patient Packet   Dry mouth    Per [place] New Patient Packet   Fibroids    Per [place] New Patient Packet   History of hip x-ray 2021   Per [place] [facility] Patient Packet   History of Papanicolaou smear of cervix 2021   Per [place] [facility] Patient Packet, Dr.Pickens   Hyperlipidemia    Ileitis    Lupus ([place])    PONV (postoperative nausea and vomiting)    mild nausea    Raynaud disease    Retinal tear    Per [facility] New Patient Packet   Systemic lupus erythematosus ([place])    Past Surgical History:  Procedure Laterality Date   CARPAL TUNNEL RELEASE Right    08/2019   CESAREAN SECTION  2003   CESAREAN SECTION  2005   Per [facility] New Patient Packet   COLONOSCOPY  2017   Dr.Jacobs   DENTAL SURGERY  2009   Fromed tooth and chronic infection   DILITATION & CURRETTAGE/HYSTROSCOPY WITH NOVASURE ABLATION N/A 06/03/2020   Procedure: DILATATION & CURETTAGE/HYSTEROSCOPY WITH NOVASURE ABLATION;  Surgeon: Aletha Halim, MD;  Location: [place];  Service: Gynecology;  Laterality: N/A;   ENDOMETRIAL BIOPSY  04/20/2020       EYE SURGERY Bilateral    cryoretinopexy   Patient Active Problem List   Diagnosis  Date Noted   Flushing 04/14/2021   Hypermobility syndrome 11/27/2020   Seasonal and perennial allergic rhinitis 11/06/2018   Mild intermittent asthma without complication 28/36/6294   Flexural atopic dermatitis 11/06/2018   History of hyperlipidemia 11/08/2016   History of anxiety 11/08/2016   History of retinal tear 11/08/2016   History of Bilateral plantar fasciitis 08/10/2016   High risk medication use 07/04/2016   Lupus ([place]) 05/23/2016   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 She is finishing up with a lupus flare. She has just finished steriods which helped her hip and back pain a good amount.  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   She reports that at the end of the day she feels pain along low back into glutes and extending out to outer hips and legs.  She cont to have pain and swelling, weakness in hands and feet.   With swelling she has more spasms.  She wants to attend PT to improve her ability to stand with good alignment, properly engage her muscles and update her HEP.   PERTINENT HISTORY:  From a previous provider: She has difficulty walking prolonged distances due to the severity of pain in her feet after work. She is also started to experience an increased discomfort in  the left SI joint and would like a referral to physical therapy which has alleviated her symptoms in the past. She currently feels more inflamed than usual. months she has been under increased stress caring for her mother who has since passed away. Over the holidays she was traveling and out of her normal routine. She has been experiencing increased pain in both hands and both feet. She has noticed intermittent inflammation in her hands and feet. She has also had symptoms of carpal tunnel in the left hand.   PAIN:  Are you having pain? Yes NPRS scale: 4/10 (can be 0 at times, rarely > 7/10) Pain location: L low back  Pain orientation: Left and Lower  PAIN TYPE: aching, sharp if I move wrong.  Catches a bit  Pain description: intermittent  Aggravating factors: standing still, lifting, overcompensating , standing still  Relieving factors: taping has helped in the past , walking is usually   PRECAUTIONS: None  WEIGHT BEARING RESTRICTIONS No  FALLS:  Has patient fallen in last 6 months? No, Number of falls: no   LIVING ENVIRONMENT: Lives with: lives with their family Lives in: House/apartment Stairs: No; Internal: 2 steps; none Has following equipment at home: None  OCCUPATION: Works from home full time   PLOF: Independent and Leisure: hiking , being outside and being active   PATIENT GOALS Pain relief    OBJECTIVE:   DIAGNOSTIC FINDINGS:  None recent in SIJ  PATIENT SURVEYS:  Modified Oswestry NT    SCREENING FOR RED FLAGS: Bowel or bladder incontinence: No Spinal tumors: No Cauda equina syndrome: No Compression fracture: No Abdominal aneurysm: No  COGNITION:  Overall cognitive status: Within functional limits for tasks assessed     SENSATION:  Light touch: Appears intact  Stereognosis: Appears intact  Hot/Cold: Appears intact  Proprioception: Appears intact  POSTURE:  Rounded shoulders , L shoulder more forward than Rt  Lt PSIS higher than Rt in prone  Lt ASIS  higher in supine than Rt    PALPATION: Pain along L lumbo sacral spine , paraspinals from low thoracic to low lumbar.  Palpation on Rt increased L sided "pressure".  Pressure with palpation on lateral SI border.   LUMBARAROM/PROM  A/PROM A/PROM  10/12/2021  Flexion Palms to floor   Extension Painful 25%  Right lateral flexion Pain R   Left lateral flexion Pain R   Right rotation WNL  Lt rotation WNL    (Blank rows = not tested)  LE AROM/PROM:     WNL throughout    LE MMT:  MMT Right 10/12/2021 Left 10/12/2021  Hip  flexion    Hip extension 5/5 5/5  Hip abduction 5/5 5/5  Hip adduction    Hip internal rotation    Hip external rotation    Knee flexion 5/5 5/5  Knee extension 5/5 5/5  Ankle dorsiflexion    Ankle plantarflexion    Ankle inversion    Ankle eversion     (Blank rows = not tested)  LUMBAR SPECIAL TESTS:  Prone instability test: NT.  Pain with SIJ compression.   Pain on L with Rt SLS,  no pain with Lt SLS   FUNCTIONAL TESTS:  Single leg stance limited on L with pain in L lateral lumbar  region.    GAIT: Comments: No issues     TODAY'S TREATMENT  Assessment and plan.  Lower ab modification for lumbar instability and "clicking"    PATIENT EDUCATION:  Education details: PT/POC, SIJ , joint instability  Person educated: Patient Education method: Explanation Education comprehension: verbalized understanding, returned demonstration, and needs further education   HOME EXERCISE PROGRAM: Has previous.  Will re-assign and review   ASSESSMENT:  CLINICAL IMPRESSION: Patient is a 51 y.o. female who was seen today for physical therapy evaluation and treatment for chronic joint hypermobility, SIJ pain . Objective impairments include difficulty walking, increased fascial restrictions, postural dysfunction, and pain. These impairments are limiting patient from cleaning, community activity, shopping, and exercise . Personal factors including Time since onset  of injury/illness/exacerbation and 3+ comorbidities: lupus, hypermobilty, mast cell   are also affecting patient's functional outcome. Patient will benefit from skilled PT to address above impairments and improve overall function.  REHAB POTENTIAL: Excellent  CLINICAL DECISION MAKING: Stable/uncomplicated  EVALUATION COMPLEXITY: Low   GOALS: Goals reviewed with patient? Yes    LONG TERM GOALS:   LTG Name Target Date Goal status  1 Pt will be I with HEP for more advanced lumbopelvic , hip exercises Baseline: 11/23/2021 INITIAL  2 Pt will be able to report centralization of pain > 75% of the time with ADLs, walking  Baseline: 11/23/2021 INITIAL  3 Pt will be able to stand on each LE for dynamic balance and stability exercises for 30 sec  Baseline: 11/23/2021 INITIAL  4 Pt will be able to hike, walk for 1 hour with no increased pain in lumbar spine Baseline: 11/23/2021 INITIAL  5 Pt will be able to realize proper spinal alignment with standing and self correct Baseline: 11/23/2021 INITIAL   PLAN: PT FREQUENCY: 1-2x/week, 1 time   PT DURATION: 6 weeks  PLANNED INTERVENTIONS: Therapeutic exercises, Therapeutic activity, Neuro Muscular re-education, Balance training, Gait training, Patient/Family education, Joint mobilization, Dry Needling, Cryotherapy, Moist heat, Taping, Ionotophoresis 109m/ml Dexamethasone, and Manual therapy  PLAN FOR NEXT SESSION: HEP, lower abd, standing/pilates springboard    JRaeford Razor PT 10/12/21 7:55 AM Phone: [phone]: [phone]  Loyal Holzheimer, PT 10/12/2021, 7:42 AM

## 2021-10-12 ENCOUNTER — Encounter: Payer: Self-pay | Admitting: Physical Therapy

## 2021-10-13 ENCOUNTER — Ambulatory Visit: Payer: BC Managed Care – PPO | Admitting: Family

## 2021-10-20 DIAGNOSIS — F331 Major depressive disorder, recurrent, moderate: Secondary | ICD-10-CM | POA: Diagnosis not present

## 2021-10-25 ENCOUNTER — Ambulatory Visit (INDEPENDENT_AMBULATORY_CARE_PROVIDER_SITE_OTHER): Payer: BC Managed Care – PPO

## 2021-10-25 DIAGNOSIS — F339 Major depressive disorder, recurrent, unspecified: Secondary | ICD-10-CM | POA: Diagnosis not present

## 2021-10-25 DIAGNOSIS — J309 Allergic rhinitis, unspecified: Secondary | ICD-10-CM | POA: Diagnosis not present

## 2021-10-25 DIAGNOSIS — F411 Generalized anxiety disorder: Secondary | ICD-10-CM | POA: Diagnosis not present

## 2021-10-25 DIAGNOSIS — F902 Attention-deficit hyperactivity disorder, combined type: Secondary | ICD-10-CM | POA: Diagnosis not present

## 2021-10-27 NOTE — Therapy (Incomplete)
OUTPATIENT PHYSICAL THERAPY THORACOLUMBAR EVALUATION   Patient Name: Robin [name] MRN: [id number] DOB:[birth year], 51 y.o., female Today's Date: 10/27/2021     Past Medical History:  Diagnosis Date   ADD (attention deficit disorder)    Allergy    Anemia    Anorexia nervosa with bulimia    Per [place] New Patient Packet   Anxiety    Asthma    Carpal tunnel syndrome    Per [place] New Patient Packet   Depression    Dry eyes    Per [facility] Patient Packet   Dry mouth    Per [place] New Patient Packet   Fibroids    Per [facility] New Patient Packet   History of hip x-ray 2021   Per [place] New Patient Packet   History of Papanicolaou smear of cervix 2021   Per [place] New Patient Packet, Dr.Pickens   Hyperlipidemia    Ileitis    Lupus ([place])    PONV (postoperative nausea and vomiting)    mild nausea    Raynaud disease    Retinal tear    Per [facility] New Patient Packet   Systemic lupus erythematosus ([place])    Past Surgical History:  Procedure Laterality Date   CARPAL TUNNEL RELEASE Right    08/2019   CESAREAN SECTION  2003   CESAREAN SECTION  2005   Per [facility] New Patient Packet   COLONOSCOPY  2017   Dr.Jacobs   DENTAL SURGERY  2009   Fromed tooth and chronic infection   DILITATION & CURRETTAGE/HYSTROSCOPY WITH NOVASURE ABLATION N/A 06/03/2020   Procedure: DILATATION & CURETTAGE/HYSTEROSCOPY WITH NOVASURE ABLATION;  Surgeon: Aletha Halim, MD;  Location: [place];  Service: Gynecology;  Laterality: N/A;   ENDOMETRIAL BIOPSY  04/20/2020       EYE SURGERY Bilateral    cryoretinopexy   Patient Active Problem List   Diagnosis Date Noted   Flushing 04/14/2021   Hypermobility syndrome 11/27/2020   Seasonal and perennial allergic rhinitis 11/06/2018   Mild intermittent asthma without complication 96/78/9381   Flexural atopic dermatitis 11/06/2018   History of hyperlipidemia 11/08/2016   History of anxiety 11/08/2016   History of retinal tear 11/08/2016   History of  Bilateral plantar fasciitis 08/10/2016   High risk medication use 07/04/2016   Lupus ([place]) 05/23/2016   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[place]) 03/18/2016   ADHD (attention deficit hyperactivity disorder) 12/22/2014   Raynaud's phenomenon 10/17/2013   Hyperlipidemia 09/28/2011    PCP: Sandrea Hughs, NP  REFERRING PROVIDER: Ofilia Neas, PA-C  REFERRING DIAG: M24.9 (ICD-10-CM) - Hypermobility of joint M53.3,G89.29 (ICD-10-CM) - Chronic left SI joint pain   THERAPY DIAG:  No diagnosis found.  ONSET DATE: chronic   SUBJECTIVE:  SUBJECTIVE STATEMENT: Pt returns for PT as familiar to me from 04/2021. She is finishing up with a lupus flare. She has just finished steriods which helped her hip and back pain a good amount.  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   She reports that at the end of the day she feels pain along low back into glutes and extending out to outer hips and legs.  She cont to have pain and swelling, weakness in hands and feet.   With swelling she has more spasms.  She wants to attend PT to improve her ability to stand with good alignment, properly engage her muscles and update her HEP.   PERTINENT HISTORY:  From a previous provider: She has difficulty walking prolonged distances due to the severity of pain in her feet after work. She is also started to experience an increased discomfort in the left SI joint and would like a referral to physical therapy which has alleviated her symptoms in the past. She currently feels more inflamed than usual. months she has been under increased stress caring for her mother who has since passed away. Over the holidays she was traveling and out of her normal routine. She has been experiencing increased pain in both hands and both feet. She has noticed  intermittent inflammation in her hands and feet. She has also had symptoms of carpal tunnel in the left hand.   PAIN:  Are you having pain? Yes NPRS scale: 4/10 (can be 0 at times, rarely > 7/10) Pain location: L low back  Pain orientation: Left and Lower  PAIN TYPE: aching, sharp if I move wrong.  Catches a bit  Pain description: intermittent  Aggravating factors: standing still, lifting, overcompensating , standing still  Relieving factors: taping has helped in the past , walking is usually   PRECAUTIONS: None  WEIGHT BEARING RESTRICTIONS No  FALLS:  Has patient fallen in last 6 months? No, Number of falls: no   LIVING ENVIRONMENT: Lives with: lives with their family Lives in: House/apartment Stairs: No; Internal: 2 steps; none Has following equipment at home: None  OCCUPATION: Works from home full time   PLOF: Independent and Leisure: hiking , being outside and being active   PATIENT GOALS Pain relief    OBJECTIVE:   DIAGNOSTIC FINDINGS:  None recent in SIJ  PATIENT SURVEYS:  Modified Oswestry NT    SCREENING FOR RED FLAGS: Bowel or bladder incontinence: No Spinal tumors: No Cauda equina syndrome: No Compression fracture: No Abdominal aneurysm: No  COGNITION:  Overall cognitive status: Within functional limits for tasks assessed     SENSATION:  Light touch: Appears intact  Stereognosis: Appears intact  Hot/Cold: Appears intact  Proprioception: Appears intact  POSTURE:  Rounded shoulders , L shoulder more forward than Rt  Lt PSIS higher than Rt in prone  Lt ASIS higher in supine than Rt    PALPATION: Pain along L lumbo sacral spine , paraspinals from low thoracic to low lumbar.  Palpation on Rt increased L sided "pressure".  Pressure with palpation on lateral SI border.   LUMBARAROM/PROM  A/PROM A/PROM  10/27/2021  Flexion Palms to floor   Extension Painful 25%  Right lateral flexion Pain R   Left lateral flexion Pain R   Right rotation WNL   Lt rotation WNL    (Blank rows = not tested)  LE AROM/PROM:     WNL throughout    LE MMT:  MMT Right 10/27/2021 Left 10/27/2021  Hip  flexion    Hip extension 5/5 5/5  Hip abduction 5/5 5/5  Hip adduction    Hip internal rotation    Hip external rotation    Knee flexion 5/5 5/5  Knee extension 5/5 5/5  Ankle dorsiflexion    Ankle plantarflexion    Ankle inversion    Ankle eversion     (Blank rows = not tested)  LUMBAR SPECIAL TESTS:  Prone instability test: NT.  Pain with SIJ compression.   Pain on L with Rt SLS,  no pain with Lt SLS   FUNCTIONAL TESTS:  Single leg stance limited on L with pain in L lateral lumbar  region.    GAIT: Comments: No issues     TODAY'S TREATMENT  Assessment and plan.  Lower ab modification for lumbar instability and "clicking"    PATIENT EDUCATION:  Education details: PT/POC, SIJ , joint instability  Person educated: Patient Education method: Explanation Education comprehension: verbalized understanding, returned demonstration, and needs further education   HOME EXERCISE PROGRAM: Has previous.  Will re-assign and review   ASSESSMENT:  CLINICAL IMPRESSION: Patient is a 51 y.o. female who was seen today for physical therapy evaluation and treatment for chronic joint hypermobility, SIJ pain . Objective impairments include difficulty walking, increased fascial restrictions, postural dysfunction, and pain. These impairments are limiting patient from cleaning, community activity, shopping, and exercise . Personal factors including Time since onset of injury/illness/exacerbation and 3+ comorbidities: lupus, hypermobilty, mast cell   are also affecting patient's functional outcome. Patient will benefit from skilled PT to address above impairments and improve overall function.  REHAB POTENTIAL: Excellent  CLINICAL DECISION MAKING: Stable/uncomplicated  EVALUATION COMPLEXITY: Low   GOALS: Goals reviewed with patient? Yes    LONG  TERM GOALS:   LTG Name Target Date Goal status  1 Pt will be I with HEP for more advanced lumbopelvic , hip exercises Baseline: 12/08/2021 INITIAL  2 Pt will be able to report centralization of pain > 75% of the time with ADLs, walking  Baseline: 12/08/2021 INITIAL  3 Pt will be able to stand on each LE for dynamic balance and stability exercises for 30 sec  Baseline: 12/08/2021 INITIAL  4 Pt will be able to hike, walk for 1 hour with no increased pain in lumbar spine Baseline: 12/08/2021 INITIAL  5 Pt will be able to realize proper spinal alignment with standing and self correct Baseline: 12/08/2021 INITIAL   PLAN: PT FREQUENCY: 1-2x/week, 1 time   PT DURATION: 6 weeks  PLANNED INTERVENTIONS: Therapeutic exercises, Therapeutic activity, Neuro Muscular re-education, Balance training, Gait training, Patient/Family education, Joint mobilization, Dry Needling, Cryotherapy, Moist heat, Taping, Ionotophoresis 64m/ml Dexamethasone, and Manual therapy  PLAN FOR NEXT SESSION: HEP, lower abd, standing/pilates springboard    JRaeford Razor PT 10/27/21 9:21 AM Phone: [phone]: [phone]  Nayib Remer, PT 10/27/2021, 9:21 AM

## 2021-10-28 ENCOUNTER — Other Ambulatory Visit: Payer: Self-pay

## 2021-10-28 ENCOUNTER — Ambulatory Visit: Payer: BC Managed Care – PPO | Attending: Physician Assistant | Admitting: Physical Therapy

## 2021-10-28 DIAGNOSIS — M6281 Muscle weakness (generalized): Secondary | ICD-10-CM | POA: Diagnosis not present

## 2021-10-28 DIAGNOSIS — M25552 Pain in left hip: Secondary | ICD-10-CM | POA: Insufficient documentation

## 2021-10-28 DIAGNOSIS — M5417 Radiculopathy, lumbosacral region: Secondary | ICD-10-CM | POA: Diagnosis not present

## 2021-10-28 DIAGNOSIS — M357 Hypermobility syndrome: Secondary | ICD-10-CM | POA: Insufficient documentation

## 2021-10-28 DIAGNOSIS — M2550 Pain in unspecified joint: Secondary | ICD-10-CM | POA: Insufficient documentation

## 2021-10-28 DIAGNOSIS — M25511 Pain in right shoulder: Secondary | ICD-10-CM | POA: Insufficient documentation

## 2021-10-28 DIAGNOSIS — G8929 Other chronic pain: Secondary | ICD-10-CM | POA: Insufficient documentation

## 2021-10-28 NOTE — Therapy (Signed)
OUTPATIENT PHYSICAL THERAPY TREATMENT NOTE   Patient Name: Robin [name] MRN: [id number] DOB:05-04-1971, 52 y.o., female Today's Date: 10/28/2021  PCP: Sandrea Hughs, NP REFERRING PROVIDER: Su Monks    Past Medical History:  Diagnosis Date   ADD (attention deficit disorder)    Allergy    Anemia    Anorexia nervosa with bulimia    Per [facility] New Patient Packet   Anxiety    Asthma    Carpal tunnel syndrome    Per [facility] New Patient Packet   Depression    Dry eyes    Per [place] New Patient Packet   Dry mouth    Per [place] New Patient Packet   Fibroids    Per [facility] New Patient Packet   History of hip x-ray 2021   Per [place] New Patient Packet   History of Papanicolaou smear of cervix 2021   Per [place] New Patient Packet, Dr.Pickens   Hyperlipidemia    Ileitis    Lupus ([place])    PONV (postoperative nausea and vomiting)    mild nausea    Raynaud disease    Retinal tear    Per [facility] New Patient Packet   Systemic lupus erythematosus ([place])    Past Surgical History:  Procedure Laterality Date   CARPAL TUNNEL RELEASE Right    08/2019   CESAREAN SECTION  2003   CESAREAN SECTION  2005   Per [facility] New Patient Packet   COLONOSCOPY  2017   Dr.Jacobs   DENTAL SURGERY  2009   Fromed tooth and chronic infection   DILITATION & CURRETTAGE/HYSTROSCOPY WITH NOVASURE ABLATION N/A 06/03/2020   Procedure: DILATATION & CURETTAGE/HYSTEROSCOPY WITH NOVASURE ABLATION;  Surgeon: Aletha Halim, MD;  Location: [place];  Service: Gynecology;  Laterality: N/A;   ENDOMETRIAL BIOPSY  04/20/2020       EYE SURGERY Bilateral    cryoretinopexy   Patient Active Problem List   Diagnosis Date Noted   Flushing 04/14/2021   Hypermobility syndrome 11/27/2020   Seasonal and perennial allergic rhinitis 11/06/2018   Mild intermittent asthma without complication 62/56/3893   Flexural atopic dermatitis 11/06/2018   History of hyperlipidemia 11/08/2016   History of anxiety 11/08/2016    History of retinal tear 11/08/2016   History of Bilateral plantar fasciitis 08/10/2016   High risk medication use 07/04/2016   Lupus ([place]) 05/23/2016   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[place]) 03/18/2016   ADHD (attention deficit hyperactivity disorder) 12/22/2014   Raynaud's phenomenon 10/17/2013   Hyperlipidemia 09/28/2011    REFERRING DIAG: M24.9 (ICD-10-CM) - Hypermobility of joint M53.3,G89.29 (ICD-10-CM) - Chronic left SI joint pain   THERAPY DIAG:  No diagnosis found.  PERTINENT HISTORY: see snapshot   PRECAUTIONS: none  SUBJECTIVE: I pushed myself this week.  My feet are feeling better but my back tightens up at night.   PAIN:  Are you having pain? Yes NPRS scale: 3/10 Pain location: L SIJ and low back  Pain orientation: Bilateral  PAIN TYPE: aching, tight  Pain description: intermittent  Aggravating factors: maybe more workouts, PM it "clenches" in low back  Relieving factors: HEP   OBJECTIVE:    DIAGNOSTIC FINDINGS:  None recent in SIJ   PATIENT SURVEYS:  Modified Oswestry NT     SCREENING FOR RED FLAGS: Bowel or bladder incontinence: No Spinal tumors: No Cauda equina syndrome: No Compression fracture: No Abdominal aneurysm: No   COGNITION:          Overall cognitive status: Within functional limits  for tasks assessed                        SENSATION:          Light touch: Appears intact          Stereognosis: Appears intact          Hot/Cold: Appears intact          Proprioception: Appears intact   POSTURE:  Rounded shoulders , L shoulder more forward than Rt  Lt PSIS higher than Rt in prone  Lt ASIS higher in supine than Rt      PALPATION: Pain along L lumbo sacral spine , paraspinals from low thoracic to low lumbar.  Palpation on Rt increased L sided "pressure".  Pressure with palpation on lateral SI border.    LUMBARAROM/PROM   A/PROM A/PROM  10/12/2021  Flexion Palms to floor   Extension Painful 25%  Right lateral flexion Pain R   Left lateral  flexion Pain R   Right rotation WNL  Lt rotation WNL    (Blank rows = not tested)   LE AROM/PROM:     WNL throughout      LE MMT:   MMT Right 10/12/2021 Left 10/12/2021  Hip flexion      Hip extension 5/5 5/5  Hip abduction 5/5 5/5  Hip adduction      Hip internal rotation      Hip external rotation      Knee flexion 5/5 5/5  Knee extension 5/5 5/5  Ankle dorsiflexion      Ankle plantarflexion      Ankle inversion      Ankle eversion       (Blank rows = not tested)   LUMBAR SPECIAL TESTS:  Prone instability test: NT.  Pain with SIJ compression.   Pain on L with Rt SLS,  no pain with Lt SLS    FUNCTIONAL TESTS:  Single leg stance limited on L with pain in L lateral lumbar  region.      GAIT: Comments: No issues        [facility] Adult PT Treatment:                                                DATE: 10/28/21 Therapeutic Exercise: HEP review  Supine Pelvic tilt  x 10 90/90 hold with ball under sacrum SLR x 10 Reverse toe taps Knee extension (clicking in L ant and post hip) Bridging articulating with yoga block x 10 2 x 2 2nd set on bolster for increased height and ROM  Manual Therapy: Soft tissue work to L lumbar and glutes, lateral SIJ border        PATIENT EDUCATION:  Education details: PT/POC, SIJ , joint instability  Person educated: Patient Education method: Explanation Education comprehension: verbalized understanding, returned demonstration, and needs further education     HOME EXERCISE PROGRAM: [id number]   ASSESSMENT:   CLINICAL IMPRESSION: Patient would like Tr P DN next time for Rt lumbar/SIJ.  SIJ is unstable and thus tension in surrounding muscles to guard is increased.  She felt better after session.  Has some Rt hip clicking and lumbar when lever arm is too long.  Reducing ROM improves this issue.    REHAB POTENTIAL: Excellent   CLINICAL DECISION MAKING: Stable/uncomplicated   EVALUATION COMPLEXITY:  Low     GOALS: Goals reviewed with  patient? Yes       LONG TERM GOALS:    LTG Name Target Date Goal status  1 Pt will be I with HEP for more advanced lumbopelvic , hip exercises Baseline: 11/23/2021 INITIAL  2 Pt will be able to report centralization of pain > 75% of the time with ADLs, walking  Baseline: 11/23/2021 INITIAL  3 Pt will be able to stand on each LE for dynamic balance and stability exercises for 30 sec  Baseline: 11/23/2021 INITIAL  4 Pt will be able to hike, walk for 1 hour with no increased pain in lumbar spine Baseline: 11/23/2021 INITIAL  5 Pt will be able to realize proper spinal alignment with standing and self correct Baseline: 11/23/2021 INITIAL    PLAN: PT FREQUENCY: 1-2x/week, 1 time    PT DURATION: 6 weeks   PLANNED INTERVENTIONS: Therapeutic exercises, Therapeutic activity, Neuro Muscular re-education, Balance training, Gait training, Patient/Family education, Joint mobilization, Dry Needling, Cryotherapy, Moist heat, Taping, Ionotophoresis 20m/ml Dexamethasone, and Manual therapy   PLAN FOR NEXT SESSION:    HEP, lower abd, standing/pilates springboard      JRaeford Razor PT 10/12/21 7:55 AM Phone: [phone]: [phone]    Larken Urias, PT 10/28/2021, 10:06 AM

## 2021-10-31 NOTE — [place] Notes (Unsigned)
Provider: Marlowe Sax FNP-C   Carrieanne Kleen, Nelda Bucks, NP  Patient Care Team: Ladasha Schnackenberg, Nelda Bucks, NP as PCP - General (Family Medicine) Valentina Shaggy, MD as Consulting Physician (Allergy and Immunology) Milus Banister, MD as Attending Physician (Gastroenterology) Aletha Halim, MD as Consulting Physician (Obstetrics and Gynecology) Drake Leach, [facility])  Extended Emergency Contact Information Primary Emergency Contact: Urizar,Todd Address: [place], [place] Phone: [phone] Mobile Phone: [phone] Relation: Spouse Secondary Emergency Contact: Payne Mobile Phone: [phone] Relation: Other  Code Status:  Full Code  Goals of care: Advanced Directive information Advanced Directives 10/11/2021  Does Patient Have a Medical Advance Directive? No  Type of Advance Directive -  Does patient want to make changes to medical advance directive? -  Would patient like information on creating a medical advance directive? No - Patient declined     No chief complaint on file.   HPI:  Pt is a 51 y.o. female seen today for 6 months medical management of chronic diseases.Has a medical history of  Hyperlipemia, ADHD,Anorexia nervosa with bulimia when she was a teenager,Generalized Anxiety Disorder,Asthma ,Depression,Lupus,Raynaud's syndrome among others.   She was seen by GI 09/24/2021 for abdominal pain and bloating.Previous elevated LFT's have normalized thought due to viral illness.she was advised to continue on OTC probiotics, Ibgard one capsule 2-3 times daily PRN and dicyclomine 10 mg twice daily PRN for abdominal pain   She was also seen by Rheumatologist for Lupus,her ANA continues to be elevated but tired is low.continues on Plaquenil   She was referred to outpatient rehab for pain on feet and hip/back.she continues to follow up   Flushing hands suspected due to Raynaud's.  She follows up with  Allergy and Asthma specialist.     Past Medical History:  Diagnosis Date   ADD (attention deficit disorder)    Allergy    Anemia    Anorexia nervosa with bulimia    Per [place] New Patient Packet   Anxiety    Asthma    Carpal tunnel syndrome    Per [place] New Patient Packet   Depression    Dry eyes    Per [place] New Patient Packet   Dry mouth    Per [place] New Patient Packet   Fibroids    Per [facility] New Patient Packet   History of hip x-ray 2021   Per [place] New Patient Packet   History of Papanicolaou smear of cervix 2021   Per [place] New Patient Packet, Dr.Pickens   Hyperlipidemia    Ileitis    Lupus ([place])    PONV (postoperative nausea and vomiting)    mild nausea    Raynaud disease    Retinal tear    Per [facility] New Patient Packet   Systemic lupus erythematosus ([place])    Past Surgical History:  Procedure Laterality Date   CARPAL TUNNEL RELEASE Right    08/2019   CESAREAN SECTION  2003   CESAREAN SECTION  2005   Per [facility] New Patient Packet   COLONOSCOPY  2017   Dr.Jacobs   DENTAL SURGERY  2009   Fromed tooth and chronic infection   DILITATION & CURRETTAGE/HYSTROSCOPY WITH NOVASURE ABLATION N/A 06/03/2020   Procedure: DILATATION & CURETTAGE/HYSTEROSCOPY WITH NOVASURE ABLATION;  Surgeon: Aletha Halim, MD;  Location: [place];  Service: Gynecology;  Laterality: N/A;   ENDOMETRIAL BIOPSY  04/20/2020  EYE SURGERY Bilateral    cryoretinopexy    Allergies  Allergen Reactions   Avelox [Moxifloxacin Hcl In Nacl] Other (See Comments)    Muscle cramps   Cefaclor Hives    Tolerates penicillins   Dust Mite Extract    Latex Other (See Comments)    "Raw skin"   Mold Extract [Trichophyton]     & cats    Red Wine Complex [Germanium]     Itching    Meloxicam Rash   Naproxen Rash    Allergies as of 11/01/2021       Reactions   Avelox [moxifloxacin Hcl In Nacl] Other (See Comments)   Muscle cramps   Cefaclor Hives   Tolerates penicillins   Dust Mite Extract     Latex Other (See Comments)   "Raw skin"   Mold Extract [trichophyton]    & cats    Red Wine Complex [germanium]    Itching    Meloxicam Rash   Naproxen Rash        Medication List        Accurate as of October 31, 2021  9:09 PM. If you have any questions, ask your nurse or doctor.          Acetyl-L-Carnitine HCl Powd by Does not apply route daily.   albuterol 108 (90 Base) MCG/ACT inhaler Commonly known as: VENTOLIN HFA Inhale 2 puffs into the lungs every 6 (six) hours as needed for wheezing or shortness of breath.   ALLEGRA PO Take by mouth daily.   amphetamine-dextroamphetamine 20 MG 24 hr capsule Commonly known as: ADDERALL XR Take 20 mg by mouth every morning.   azelastine 0.1 % nasal spray Commonly known as: ASTELIN USE 2 SPRAYS IN EACH NOSTRIL TWICE DAILY AS NEEDED FOR RHINITIS   BENADRYL ALLERGY PO Take 50 mg by mouth daily at 12 noon.   buPROPion 300 MG 24 hr tablet Commonly known as: WELLBUTRIN XL Take 300 mg by mouth daily.   CLARITIN PO Take by mouth.   CO Q 10 PO Take by mouth daily.   COLLAGEN PO Take by mouth daily.   dicyclomine 10 MG capsule Commonly known as: BENTYL Take 1 capsule (10 mg total) by mouth 2 (two) times daily as needed for spasms (abdominal pain).   EPINEPHrine 0.3 mg/0.3 mL Soaj injection Commonly known as: EPI-PEN Inject 0.3 mg into the muscle as needed for anaphylaxis.   FLUoxetine 10 MG capsule Commonly known as: PROZAC Take 10 mg by mouth every morning.   hydroxychloroquine 200 MG tablet Commonly known as: PLAQUENIL TAKE 1 TABLET(200 MG) BY MOUTH TWICE DAILY   Melatonin 10 MG Tabs Take 1 tablet by mouth daily.   Methylsulfonylmethane 1000 MG Caps Take 1 capsule by mouth daily.   MIRALAX PO Take by mouth.   montelukast 10 MG tablet Commonly known as: SINGULAIR TAKE 1 TABLET BY MOUTH NIGHTLY AT BEDTIME   OMEGA-3-6-9 PO Take 1 capsule by mouth as needed.   predniSONE 5 MG tablet Commonly  known as: DELTASONE Take 4 tablets by mouth daily x4 days, 3 tablets daily x4 days, 2 tablets daily x4 days, 1 tablet daily x4 days.   PROBIOTIC PO Take by mouth daily.   QC TUMERIC COMPLEX PO Take 1 capsule by mouth daily.   SUMAtriptan 50 MG tablet Commonly known as: IMITREX Take 1 tablet (50 mg total) by mouth once for 1 dose. May repeat in 2 hours if headache persists or recurs, but do not exceed 262m per  day for migraine.   UNABLE TO FIND once a week. Med Name: allergy shots   VITAMIN B COMPLEX PO Take by mouth daily.   VITAMIN D (CHOLECALCIFEROL) PO Take 500 mg by mouth.   Vitamin K2 100 MCG Caps Take 1 capsule by mouth daily.        Review of Systems  Constitutional:  Negative for appetite change, chills, fatigue, fever and unexpected weight change.  HENT:  Negative for congestion, dental problem, ear discharge, ear pain, facial swelling, hearing loss, nosebleeds, postnasal drip, rhinorrhea, sinus pressure, sinus pain, sneezing, sore throat, tinnitus and trouble swallowing.   Eyes:  Negative for pain, discharge, redness, itching and visual disturbance.  Respiratory:  Negative for cough, chest tightness, shortness of breath and wheezing.   Cardiovascular:  Negative for chest pain, palpitations and leg swelling.  Gastrointestinal:  Negative for abdominal distention, abdominal pain, blood in stool, constipation, diarrhea, nausea and vomiting.  Endocrine: Negative for cold intolerance, heat intolerance, polydipsia, polyphagia and polyuria.  Genitourinary:  Negative for difficulty urinating, dysuria, flank pain, frequency and urgency.  Musculoskeletal:  Negative for arthralgias, back pain, gait problem, joint swelling, myalgias, neck pain and neck stiffness.  Skin:  Negative for color change, pallor, rash and wound.  Neurological:  Negative for dizziness, syncope, speech difficulty, weakness, light-headedness, numbness and headaches.  Hematological:  Does not bruise/bleed  easily.  Psychiatric/Behavioral:  Negative for agitation, behavioral problems, confusion, hallucinations, self-injury, sleep disturbance and suicidal ideas. The patient is not nervous/anxious.    Immunization History  Administered Date(s) Administered   Influenza,inj,Quad PF,6+ Mos 05/12/2014, 11/27/2015, 05/23/2016, 10/30/2017, 07/30/2018   Moderna Sars-Covid-2 Vaccination 10/27/2020, 10/31/2020   PFIZER(Purple Top)SARS-COV-2 Vaccination 12/06/2019, 12/16/2019, 05/27/2020   Pneumococcal Conjugate-13 05/23/2016   Tdap 09/28/2011, 08/12/2020   Pertinent  Health Maintenance Due  Topic Date Due   INFLUENZA VACCINE  04/12/2021   PAP SMEAR-Modifier  04/21/2023   COLONOSCOPY (Pts 45-16yr Insurance coverage will need to be confirmed)  04/08/2026   Fall Risk 10/04/2018 02/20/2020 06/03/2020 08/12/2020 04/05/2021  Falls in the past year? 1 0 - - 0  Was there an injury with Fall? 1 0 - - 0  Was there an injury with Fall? - - - - -  Fall Risk Category Calculator 3 0 - - 0  Fall Risk Category High Low - - Low  Patient Fall Risk Level - - Low fall risk High fall risk Low fall risk  Patient at Risk for Falls Due to - - - - No Fall Risks  Fall risk Follow up - - - - Falls evaluation completed   Functional Status Survey:    There were no vitals filed for this visit. There is no height or weight on file to calculate BMI. Physical Exam Vitals reviewed.  Constitutional:      General: She is not in acute distress.    Appearance: Normal appearance. She is normal weight. She is not ill-appearing or diaphoretic.  HENT:     Head: Normocephalic.     Right Ear: Tympanic membrane, ear canal and external ear normal. There is no impacted cerumen.     Left Ear: Tympanic membrane, ear canal and external ear normal. There is no impacted cerumen.     Nose: Nose normal. No congestion or rhinorrhea.     Mouth/Throat:     Mouth: Mucous membranes are moist.     Pharynx: Oropharynx is clear. No oropharyngeal  exudate or posterior oropharyngeal erythema.  Eyes:     General: No scleral  icterus.       Right eye: No discharge.        Left eye: No discharge.     Extraocular Movements: Extraocular movements intact.     Conjunctiva/sclera: Conjunctivae normal.     Pupils: Pupils are equal, round, and reactive to light.  Neck:     Vascular: No carotid bruit.  Cardiovascular:     Rate and Rhythm: Normal rate and regular rhythm.     Pulses: Normal pulses.     Heart sounds: Normal heart sounds. No murmur heard.   No friction rub. No gallop.  Pulmonary:     Effort: Pulmonary effort is normal. No respiratory distress.     Breath sounds: Normal breath sounds. No wheezing, rhonchi or rales.  Chest:     Chest wall: No tenderness.  Abdominal:     General: Bowel sounds are normal. There is no distension.     Palpations: Abdomen is soft. There is no mass.     Tenderness: There is no abdominal tenderness. There is no right CVA tenderness, left CVA tenderness, guarding or rebound.  Musculoskeletal:        General: No swelling or tenderness. Normal range of motion.     Cervical back: Normal range of motion. No rigidity or tenderness.     Right lower leg: No edema.     Left lower leg: No edema.  Lymphadenopathy:     Cervical: No cervical adenopathy.  Skin:    General: Skin is warm and dry.     Coloration: Skin is not pale.     Findings: No bruising, erythema, lesion or rash.  Neurological:     Mental Status: She is alert and oriented to person, place, and time.     Cranial Nerves: No cranial nerve deficit.     Sensory: No sensory deficit.     Motor: No weakness.     Coordination: Coordination normal.     Gait: Gait normal.  Psychiatric:        Mood and Affect: Mood normal.        Speech: Speech normal.        Behavior: Behavior normal.        Thought Content: Thought content normal.        Judgment: Judgment normal.    Labs reviewed: Recent Labs    01/26/21 0000 03/23/21 1052 04/07/21 0859  09/23/21 0955  NA 137 137 136 138  K 4.5 4.6 4.5 4.6  CL 101 101 102 101  CO2 27 27 28 31   GLUCOSE 87 89 80 75  BUN 15 19 11 13   CREATININE 0.86 0.95 0.91 0.83  CALCIUM 10.1 9.8 9.6 10.0  PHOS 4.1  --   --   --    Recent Labs    03/23/21 1052 04/07/21 0859 09/23/21 0955  AST 58* 19 22  ALT 100* 17 17  BILITOT 0.4 0.6 0.6  PROT 7.5 6.9 7.6   Recent Labs    03/23/21 1052 04/07/21 0859 09/23/21 0955  WBC 7.5 5.8 4.6  NEUTROABS 3,630 3,329 2,452  HGB 13.6 13.4 14.0  HCT 41.7 39.3 42.6  MCV 95.4 95.2 96.8  PLT 290 302 295   Lab Results  Component Value Date   TSH 1.70 04/07/2021   No results found for: HGBA1C Lab Results  Component Value Date   CHOL 260 (H) 04/07/2021   HDL 84 04/07/2021   LDLCALC 157 (H) 04/07/2021   TRIG 84 04/07/2021   CHOLHDL 3.1 04/07/2021  Significant Diagnostic Results in last 30 days:  No results found.  Assessment/Plan 1. Mild intermittent asthma, uncomplicated ***  2. Mixed hyperlipidemia ***  3. Attention deficit hyperactivity disorder (ADHD), unspecified ADHD type ***  4. Seasonal and perennial allergic rhinitis ***  5. Lupus ([facility]) ***  6. Migraine without aura and without status migrainosus, not intractable ***    Family/ staff Communication: Reviewed plan of care with patient verbalized understanding   Labs/tests ordered:  - CBC with Differential/Platelet - CMP with eGFR([facility]) - TSH - Lipid panel  Next Appointment : 6 months for medical management of chronic issues.Fasting Labs prior to visit.    Sandrea Hughs, NP

## 2021-11-01 ENCOUNTER — Other Ambulatory Visit: Payer: Self-pay | Admitting: Rheumatology

## 2021-11-01 ENCOUNTER — Ambulatory Visit: Payer: BC Managed Care – PPO | Admitting: Family

## 2021-11-01 DIAGNOSIS — G43009 Migraine without aura, not intractable, without status migrainosus: Secondary | ICD-10-CM

## 2021-11-01 DIAGNOSIS — Z03818 Encounter for observation for suspected exposure to other biological agents ruled out: Secondary | ICD-10-CM | POA: Diagnosis not present

## 2021-11-01 DIAGNOSIS — M3219 Other organ or system involvement in systemic lupus erythematosus: Secondary | ICD-10-CM

## 2021-11-01 DIAGNOSIS — M329 Systemic lupus erythematosus, unspecified: Secondary | ICD-10-CM

## 2021-11-01 DIAGNOSIS — J4520 Mild intermittent asthma, uncomplicated: Secondary | ICD-10-CM

## 2021-11-01 DIAGNOSIS — J302 Other seasonal allergic rhinitis: Secondary | ICD-10-CM

## 2021-11-01 DIAGNOSIS — E782 Mixed hyperlipidemia: Secondary | ICD-10-CM

## 2021-11-01 DIAGNOSIS — U071 COVID-19: Secondary | ICD-10-CM | POA: Diagnosis not present

## 2021-11-01 DIAGNOSIS — F909 Attention-deficit hyperactivity disorder, unspecified type: Secondary | ICD-10-CM

## 2021-11-01 MED ORDER — HYDROXYCHLOROQUINE SULFATE 200 MG PO TABS: ORAL_TABLET | ORAL | 0 refills | Status: DC

## 2021-11-01 NOTE — Therapy (Signed)
OUTPATIENT PHYSICAL THERAPY TREATMENT NOTE   Patient Name: Robin [name] MRN: [id number] DOB:1970-12-14, 51 y.o., female Today's Date: 11/02/2021  PCP: Sandrea Hughs, NP REFERRING PROVIDER: Sandrea Hughs, NP   PT End of Session - 11/02/21 0855     Visit Number 3    Number of Visits 12    Date for PT Re-Evaluation 11/22/21    Authorization Type BCBS    PT Start Time 0850    PT Stop Time 0944    PT Time Calculation (min) 54 min    Activity Tolerance Patient tolerated treatment well    Behavior During Therapy WFL for tasks assessed/performed             Past Medical History:  Diagnosis Date   ADD (attention deficit disorder)    Allergy    Anemia    Anorexia nervosa with bulimia    Per [place] New Patient Packet   Anxiety    Asthma    Carpal tunnel syndrome    Per [place] New Patient Packet   Depression    Dry eyes    Per [place] New Patient Packet   Dry mouth    Per [place] New Patient Packet   Fibroids    Per [facility] Patient Packet   History of hip x-ray 2021   Per [place] New Patient Packet   History of Papanicolaou smear of cervix 2021   Per [place] New Patient Packet, Dr.Pickens   Hyperlipidemia    Ileitis    Lupus ([place])    PONV (postoperative nausea and vomiting)    mild nausea    Raynaud disease    Retinal tear    Per [facility] New Patient Packet   Systemic lupus erythematosus ([place])    Past Surgical History:  Procedure Laterality Date   CARPAL TUNNEL RELEASE Right    08/2019   CESAREAN SECTION  2003   CESAREAN SECTION  2005   Per [facility] New Patient Packet   COLONOSCOPY  2017   Dr.Jacobs   DENTAL SURGERY  2009   Fromed tooth and chronic infection   DILITATION & CURRETTAGE/HYSTROSCOPY WITH NOVASURE ABLATION N/A 06/03/2020   Procedure: DILATATION & CURETTAGE/HYSTEROSCOPY WITH NOVASURE ABLATION;  Surgeon: Aletha Halim, MD;  Location: [place];  Service: Gynecology;  Laterality: N/A;   ENDOMETRIAL BIOPSY  04/20/2020       EYE SURGERY Bilateral     cryoretinopexy   Patient Active Problem List   Diagnosis Date Noted   Flushing 04/14/2021   Hypermobility syndrome 11/27/2020   Seasonal and perennial allergic rhinitis 11/06/2018   Mild intermittent asthma without complication 40/97/3532   Flexural atopic dermatitis 11/06/2018   History of hyperlipidemia 11/08/2016   History of anxiety 11/08/2016   History of retinal tear 11/08/2016   History of Bilateral plantar fasciitis 08/10/2016   High risk medication use 07/04/2016   Lupus ([place]) 05/23/2016   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[place]) 03/18/2016   ADHD (attention deficit hyperactivity disorder) 12/22/2014   Raynaud's phenomenon 10/17/2013   Hyperlipidemia 09/28/2011    REFERRING DIAG: M24.9 (ICD-10-CM) - Hypermobility of joint M53.3,G89.29 (ICD-10-CM) - Chronic left SI joint pain   THERAPY DIAG:  Hypermobility syndrome  Polyarthralgia  Radiculopathy, lumbosacral region  Pain in left hip  Chronic right shoulder pain  Muscle weakness (generalized)  PERTINENT HISTORY: see snapshot   PRECAUTIONS: none  SUBJECTIVE: I am looking forward to dry needling today.  I took my dog out and did some dynamic stretching.  That is all I  have done today.  I was in the car and pretty active this weekend. I was on my feet and walking around a lot. I went for horse back riding lessons yesterday.  PAIN:  Are you having pain? Yes NPRS scale: 6/10 Pain location: L SIJ and low back  Pain orientation: Bilateral  PAIN TYPE: aching, tight  Pain description: intermittent  Aggravating factors: maybe more workouts, PM it "clenches" in low back  Relieving factors: HEP   OBJECTIVE:    DIAGNOSTIC FINDINGS:  None recent in SIJ   PATIENT SURVEYS:  Modified Oswestry NT     SCREENING FOR RED FLAGS: Bowel or bladder incontinence: No Spinal tumors: No Cauda equina syndrome: No Compression fracture: No Abdominal aneurysm: No   COGNITION:          Overall cognitive status: Within functional limits for  tasks assessed                        SENSATION:          Light touch: Appears intact          Stereognosis: Appears intact          Hot/Cold: Appears intact          Proprioception: Appears intact   POSTURE:  Rounded shoulders , L shoulder more forward than Rt  Lt PSIS higher than Rt in prone  Lt ASIS higher in supine than Rt      PALPATION: Pain along L lumbo sacral spine , paraspinals from low thoracic to low lumbar.  Palpation on Rt increased L sided "pressure".  Pressure with palpation on lateral SI border.    LUMBARAROM/PROM   A/PROM A/PROM  10/12/2021  Flexion Palms to floor   Extension Painful 25%  Right lateral flexion Pain R   Left lateral flexion Pain R   Right rotation WNL  Lt rotation WNL    (Blank rows = not tested)   LE AROM/PROM:     WNL throughout      LE MMT:   MMT Right 10/12/2021 Left 10/12/2021  Hip flexion      Hip extension 5/5 5/5  Hip abduction 5/5 5/5  Hip adduction      Hip internal rotation      Hip external rotation      Knee flexion 5/5 5/5  Knee extension 5/5 5/5  Ankle dorsiflexion      Ankle plantarflexion      Ankle inversion      Ankle eversion       (Blank rows = not tested)   LUMBAR SPECIAL TESTS:  Prone instability test: NT.  Pain with SIJ compression.   Pain on L with Rt SLS,  no pain with Lt SLS    FUNCTIONAL TESTS:  Single leg stance limited on L with pain in L lateral lumbar  region.      GAIT: Comments: No issues    [facility] Adult PT Treatment:                                                DATE: 11-02-21 Therapeutic Exercise: Posture control and awareness of rib flaring in standing. Then added Airex pad with star pattern RTB for UE with VC for pt hyperextending knees and swayback. Added SLS with forward kicks and side kicks. Manual Therapy: KT tape  triangle window over Left SI. Transverse I strip over low back horizontally 50 % and two I strips 50% vertically along bil paraspinals STW of left QL and gluteals and  Left SI Trigger Point Dry Needling Treatment: Pre-treatment instruction: Patient instructed on dry needling rationale, procedures, and possible side effects including pain during treatment (achy,cramping feeling), bruising, drop of blood, lightheadedness, nausea, sweating. Patient Consent Given: Yes Education handout provided: Yes Muscles treated: Left QL lumbar 2- to SI, paraspinals Left gluteals,   Needle size and number: .30x10m x 2 and .30x751mx 4 Electrical stimulation performed: No Parameters: N/A Treatment response/outcome: Twitch response elicited and Palpable decrease in muscle tension Post-treatment instructions: Patient instructed to expect possible mild to moderate muscle soreness later today and/or tomorrow. Patient instructed in methods to reduce muscle soreness and to continue prescribed HEP. If patient was dry needled over the lung field, patient was instructed on signs and symptoms of pneumothorax and, however unlikely, to see immediate medical attention should they occur. Patient was also educated on signs and symptoms of infection and to seek medical attention should they occur. Patient verbalized understanding of these instructions and education.   Modalities: Moist Hot pack to Low back and SI in prone      [facility]                                                DATE: 10/28/21 Therapeutic Exercise: HEP review  Supine Pelvic tilt  x 10 90/90 hold with ball under sacrum SLR x 10 Reverse toe taps Knee extension (clicking in L ant and post hip) Bridging articulating with yoga block x 10 2 x 2 2nd set on bolster for increased height and ROM  Manual Therapy: Soft tissue work to L lumbar and glutes, lateral SIJ border        PATIENT EDUCATION:  Education details: PT/POC, SIJ , joint instability  Person educated: Patient Education method: Explanation Education comprehension: verbalized understanding, returned demonstration, and needs further  education     HOME EXERCISE PROGRAM: [id number]   ASSESSMENT:   CLINICAL IMPRESSION:  Pt consents to TPDN  and is closely monitored throughout session.  Pt back and SIJ is unstable and thus tension in surrounding muscles to guard is increased.  She felt better after session. Of TPDN.    Pt tends to hyperextend knees and swayback when on airex pad.  Pt given cues to maintain alignment and decrease rib flaring in standing. Pt will continue to benefit from skilled PT to address postural awareness and continue strengthening and stabilizing.   REHAB POTENTIAL: Excellent   CLINICAL DECISION MAKING: Stable/uncomplicated   EVALUATION COMPLEXITY: Low     GOALS: Goals reviewed with patient? Yes       LONG TERM GOALS:    LTG Name Target Date Goal status  1 Pt will be I with HEP for more advanced lumbopelvic , hip exercises Baseline: 11/23/2021 INITIAL  2 Pt will be able to report centralization of pain > 75% of the time with ADLs, walking  Baseline: 11/23/2021 INITIAL  3 Pt will be able to stand on each LE for dynamic balance and stability exercises for 30 sec  Baseline: 11/23/2021 INITIAL  4 Pt will be able to hike, walk for 1 hour with no increased pain in lumbar spine Baseline: 11/23/2021 INITIAL  5 Pt will be  able to realize proper spinal alignment with standing and self correct Baseline: 11/23/2021 INITIAL    PLAN: PT FREQUENCY: 1-2x/week, 1 time    PT DURATION: 6 weeks   PLANNED INTERVENTIONS: Therapeutic exercises, Therapeutic activity, Neuro Muscular re-education, Balance training, Gait training, Patient/Family education, Joint mobilization, Dry Needling, Cryotherapy, Moist heat, Taping, Ionotophoresis 65m/ml Dexamethasone, and Manual therapy   PLAN FOR NEXT SESSION:    HEP, lower abd, standing/pilates springboard  Assess TPDN benefit and KT tape     LVoncille Lo PT, ACapacCertified Exercise Expert for the Aging Adult  11/02/21 10:48 AM Phone: [phone]:  [phone]

## 2021-11-01 NOTE — Telephone Encounter (Signed)
Patient called the office requesting a refill of Plaquenil 279m to [facility]

## 2021-11-01 NOTE — Telephone Encounter (Signed)
Next Visit: 12/23/2021  Last Visit: 09/23/2021  Labs: 09/23/2021 Alk phos is low-stable. Rest of CMP WNL.  CBC WNL.    Eye exam: 9/22/ 2022 WNL   Current Dose per office note 09/23/2021: Plaquenil 200 mg 1 tablet by mouth twice daily  RJ:GYLUD systemic lupus erythematosus with other organ involvement   Last Fill: 07/26/2021  Okay to refill Plaquenil?

## 2021-11-02 ENCOUNTER — Encounter: Payer: Self-pay | Admitting: Physical Therapy

## 2021-11-02 ENCOUNTER — Ambulatory Visit: Payer: BC Managed Care – PPO | Admitting: Physical Therapy

## 2021-11-02 ENCOUNTER — Other Ambulatory Visit: Payer: Self-pay

## 2021-11-02 DIAGNOSIS — M6281 Muscle weakness (generalized): Secondary | ICD-10-CM | POA: Diagnosis not present

## 2021-11-02 DIAGNOSIS — M2550 Pain in unspecified joint: Secondary | ICD-10-CM

## 2021-11-02 DIAGNOSIS — M5417 Radiculopathy, lumbosacral region: Secondary | ICD-10-CM

## 2021-11-02 DIAGNOSIS — M25552 Pain in left hip: Secondary | ICD-10-CM

## 2021-11-02 DIAGNOSIS — M357 Hypermobility syndrome: Secondary | ICD-10-CM

## 2021-11-02 DIAGNOSIS — M25511 Pain in right shoulder: Secondary | ICD-10-CM | POA: Diagnosis not present

## 2021-11-02 DIAGNOSIS — G8929 Other chronic pain: Secondary | ICD-10-CM | POA: Diagnosis not present

## 2021-11-02 NOTE — Patient Instructions (Signed)

## 2021-11-03 DIAGNOSIS — F331 Major depressive disorder, recurrent, moderate: Secondary | ICD-10-CM | POA: Diagnosis not present

## 2021-11-04 ENCOUNTER — Ambulatory Visit: Payer: BC Managed Care – PPO | Admitting: Physical Therapy

## 2021-11-04 ENCOUNTER — Ambulatory Visit (INDEPENDENT_AMBULATORY_CARE_PROVIDER_SITE_OTHER): Payer: BC Managed Care – PPO | Admitting: *Deleted

## 2021-11-04 DIAGNOSIS — J309 Allergic rhinitis, unspecified: Secondary | ICD-10-CM

## 2021-11-10 DIAGNOSIS — F331 Major depressive disorder, recurrent, moderate: Secondary | ICD-10-CM | POA: Diagnosis not present

## 2021-11-11 ENCOUNTER — Ambulatory Visit: Payer: BC Managed Care – PPO | Admitting: Physical Therapy

## 2021-11-17 NOTE — Therapy (Signed)
OUTPATIENT PHYSICAL THERAPY TREATMENT NOTE   Patient Name: Robin [name] MRN: [id number] DOB:15-Feb-1971, 51 y.o., female Today's Date: 11/18/2021  PCP: Sandrea Hughs, NP REFERRING PROVIDER: Ofilia Neas, PA-C   PT End of Session - 11/18/21 0849     Visit Number 4    Number of Visits 12    Date for PT Re-Evaluation 11/22/21    Authorization Type BCBS    PT Start Time [phone]    PT Stop Time 0930    PT Time Calculation (min) 44 min    Activity Tolerance Patient tolerated treatment well    Behavior During Therapy WFL for tasks assessed/performed              Past Medical History:  Diagnosis Date   ADD (attention deficit disorder)    Allergy    Anemia    Anorexia nervosa with bulimia    Per [place] New Patient Packet   Anxiety    Asthma    Carpal tunnel syndrome    Per [place] New Patient Packet   Depression    Dry eyes    Per [place] New Patient Packet   Dry mouth    Per [place] New Patient Packet   Fibroids    Per [facility] New Patient Packet   History of hip x-ray 2021   Per [place] New Patient Packet   History of Papanicolaou smear of cervix 2021   Per [place] New Patient Packet, Dr.Pickens   Hyperlipidemia    Ileitis    Lupus ([place])    PONV (postoperative nausea and vomiting)    mild nausea    Raynaud disease    Retinal tear    Per [facility] New Patient Packet   Systemic lupus erythematosus ([place])    Past Surgical History:  Procedure Laterality Date   CARPAL TUNNEL RELEASE Right    08/2019   CESAREAN SECTION  2003   CESAREAN SECTION  2005   Per [facility] New Patient Packet   COLONOSCOPY  2017   Dr.Jacobs   DENTAL SURGERY  2009   Fromed tooth and chronic infection   DILITATION & CURRETTAGE/HYSTROSCOPY WITH NOVASURE ABLATION N/A 06/03/2020   Procedure: DILATATION & CURETTAGE/HYSTEROSCOPY WITH NOVASURE ABLATION;  Surgeon: Aletha Halim, MD;  Location: [place];  Service: Gynecology;  Laterality: N/A;   ENDOMETRIAL BIOPSY  04/20/2020       EYE SURGERY Bilateral     cryoretinopexy   Patient Active Problem List   Diagnosis Date Noted   Flushing 04/14/2021   Hypermobility syndrome 11/27/2020   Seasonal and perennial allergic rhinitis 11/06/2018   Mild intermittent asthma without complication 33/82/5053   Flexural atopic dermatitis 11/06/2018   History of hyperlipidemia 11/08/2016   History of anxiety 11/08/2016   History of retinal tear 11/08/2016   History of Bilateral plantar fasciitis 08/10/2016   High risk medication use 07/04/2016   Lupus ([place]) 05/23/2016   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[place]) 03/18/2016   ADHD (attention deficit hyperactivity disorder) 12/22/2014   Raynaud's phenomenon 10/17/2013   Hyperlipidemia 09/28/2011    REFERRING DIAG: M24.9 (ICD-10-CM) - Hypermobility of joint M53.3,G89.29 (ICD-10-CM) - Chronic left SI joint pain   THERAPY DIAG:  Hypermobility syndrome  Polyarthralgia  Radiculopathy, lumbosacral region  Pain in left hip  Chronic right shoulder pain  Muscle weakness (generalized)  PERTINENT HISTORY: see snapshot   PRECAUTIONS: none  SUBJECTIVE: The needling really helps me. I tried practicing the posture she showed me and I felt crazy when I tried to do it.  Back is good which is a miracle because I helped clean out my mom's house . Shoulder pain 6/10    PAIN:  Are you having pain? Yes NPRS scale: 3/10 Pain location: L SIJ and low back  Pain orientation: Bilateral  PAIN TYPE: aching, tight  Pain description: intermittent  Aggravating factors: maybe more workouts, PM it "clenches" in low back  Relieving factors: HEP   OBJECTIVE:    DIAGNOSTIC FINDINGS:  None recent in SIJ   PATIENT SURVEYS:  Modified Oswestry NT     SCREENING FOR RED FLAGS: Bowel or bladder incontinence: No Spinal tumors: No Cauda equina syndrome: No Compression fracture: No Abdominal aneurysm: No   COGNITION:          Overall cognitive status: Within functional limits for tasks assessed                        SENSATION:           Light touch: Appears intact          Stereognosis: Appears intact          Hot/Cold: Appears intact          Proprioception: Appears intact   POSTURE:  Rounded shoulders , L shoulder more forward than Rt  Lt PSIS higher than Rt in prone  Lt ASIS higher in supine than Rt      PALPATION: Pain along L lumbo sacral spine , paraspinals from low thoracic to low lumbar.  Palpation on Rt increased L sided "pressure".  Pressure with palpation on lateral SI border.    LUMBARAROM/PROM   A/PROM A/PROM  10/12/2021  Flexion Palms to floor   Extension Painful 25%  Right lateral flexion Pain R   Left lateral flexion Pain R   Right rotation WNL  Lt rotation WNL    (Blank rows = not tested)   LE AROM/PROM:     WNL throughout      LE MMT:   MMT Right 10/12/2021 Left 10/12/2021  Hip flexion      Hip extension 5/5 5/5  Hip abduction 5/5 5/5  Hip adduction      Hip internal rotation      Hip external rotation      Knee flexion 5/5 5/5  Knee extension 5/5 5/5  Ankle dorsiflexion      Ankle plantarflexion      Ankle inversion      Ankle eversion       (Blank rows = not tested)   LUMBAR SPECIAL TESTS:  Prone instability test: NT.  Pain with SIJ compression.   Pain on L with Rt SLS,  no pain with Lt SLS    FUNCTIONAL TESTS:  Single leg stance limited on L with pain in L lateral lumbar  region.      GAIT: Comments: No issues      [facility] Adult PT Treatment:                                                DATE: 11/17/21 Therapeutic Exercise:  Elliptical 5 min L 12 ramp, L 5 resistance  Reformer  Scooter 1 red spring Press back  Lunge  Single leg hip hinge  Curtsy Footwork 2 red 1 blue 1 yellow  Parallel on heels, hip turnout Parallel on toes then heels raises  Manual Therapy: Kinesiotape to Rt shoulder for support , 3 Y tapes Infraspinatus, deltoid and pec minor    [facility] Adult PT Treatment:                                                DATE: 11-02-21 Therapeutic  Exercise: Posture control and awareness of rib flaring in standing. Then added Airex pad with star pattern RTB for UE with VC for pt hyperextending knees and swayback. Added SLS with forward kicks and side kicks. Manual Therapy: KT tape triangle window over Left SI. Transverse I strip over low back horizontally 50 % and two I strips 50% vertically along bil paraspinals STW of left QL and gluteals and Left SI Trigger Point Dry Needling Treatment: Pre-treatment instruction: Patient instructed on dry needling rationale, procedures, and possible side effects including pain during treatment (achy,cramping feeling), bruising, drop of blood, lightheadedness, nausea, sweating. Patient Consent Given: Yes Education handout provided: Yes Muscles treated: Left QL lumbar 2- to SI, paraspinals Left gluteals,   Needle size and number: .30x68m x 2 and .30x787mx 4 Electrical stimulation performed: No Parameters: N/A Treatment response/outcome: Twitch response elicited and Palpable decrease in muscle tension Post-treatment instructions: Patient instructed to expect possible mild to moderate muscle soreness later today and/or tomorrow. Patient instructed in methods to reduce muscle soreness and to continue prescribed HEP. If patient was dry needled over the lung field, patient was instructed on signs and symptoms of pneumothorax and, however unlikely, to see immediate medical attention should they occur. Patient was also educated on signs and symptoms of infection and to seek medical attention should they occur. Patient verbalized understanding of these instructions and education.   Modalities: Moist Hot pack to Low back and SI in prone      [facility]                                                DATE: 10/28/21 Therapeutic Exercise: HEP review  Supine Pelvic tilt  x 10 90/90 hold with ball under sacrum SLR x 10 Reverse toe taps Knee extension (clicking in L ant and post hip) Bridging  articulating with yoga block x 10 2 x 2 2nd set on bolster for increased height and ROM  Manual Therapy: Soft tissue work to L lumbar and glutes, lateral SIJ border        PATIENT EDUCATION:  Education details: PT/POC, SIJ , joint instability  Person educated: Patient Education method: Explanation Education comprehension: verbalized understanding, returned demonstration, and needs further education     HOME EXERCISE PROGRAM: [id number]   ASSESSMENT:   CLINICAL IMPRESSION:  Patient notes increased lumbar pain after standing exercises.  Her symptoms are likely an overreaction of her nervous system to protect her spinal instability.  Worked on alignment in standing and controlling her hip motion in order to maintain spine stability and security.  Used more points of contact and supportive surfaces. Taper Rt shoulder for stability.     REHAB POTENTIAL: Excellent   CLINICAL DECISION MAKING: Stable/uncomplicated   EVALUATION COMPLEXITY: Low     GOALS: Goals reviewed with patient? Yes       LONG TERM GOALS:    LTG Name Target Date Goal  status  1 Pt will be I with HEP for more advanced lumbopelvic , hip exercises Baseline: 11/23/2021 INITIAL  2 Pt will be able to report centralization of pain > 75% of the time with ADLs, walking  Baseline: 11/23/2021 INITIAL  3 Pt will be able to stand on each LE for dynamic balance and stability exercises for 30 sec  Baseline: 11/23/2021 INITIAL  4 Pt will be able to hike, walk for 1 hour with no increased pain in lumbar spine Baseline: 11/23/2021 INITIAL  5 Pt will be able to realize proper spinal alignment with standing and self correct Baseline: 11/23/2021 INITIAL    PLAN: PT FREQUENCY: 1-2x/week, 1 time    PT DURATION: 6 weeks   PLANNED INTERVENTIONS: Therapeutic exercises, Therapeutic activity, Neuro Muscular re-education, Balance training, Gait training, Patient/Family education, Joint mobilization, Dry Needling, Cryotherapy, Moist  heat, Taping, Ionotophoresis 12m/ml Dexamethasone, and Manual therapy   PLAN FOR NEXT SESSION:   GOALS? Continue POC>  HEP, lower abd, standing/pilates springboard  Assess TPDN benefit and KT tape    JRaeford Razor PT 11/18/21 1:23 PM Phone: [phone]: [phone]

## 2021-11-18 ENCOUNTER — Ambulatory Visit: Payer: BC Managed Care – PPO | Attending: Physician Assistant | Admitting: Physical Therapy

## 2021-11-18 ENCOUNTER — Other Ambulatory Visit: Payer: Self-pay

## 2021-11-18 ENCOUNTER — Ambulatory Visit (INDEPENDENT_AMBULATORY_CARE_PROVIDER_SITE_OTHER): Payer: BC Managed Care – PPO

## 2021-11-18 DIAGNOSIS — M6281 Muscle weakness (generalized): Secondary | ICD-10-CM | POA: Diagnosis not present

## 2021-11-18 DIAGNOSIS — M2550 Pain in unspecified joint: Secondary | ICD-10-CM | POA: Insufficient documentation

## 2021-11-18 DIAGNOSIS — M5417 Radiculopathy, lumbosacral region: Secondary | ICD-10-CM

## 2021-11-18 DIAGNOSIS — M357 Hypermobility syndrome: Secondary | ICD-10-CM | POA: Diagnosis not present

## 2021-11-18 DIAGNOSIS — J309 Allergic rhinitis, unspecified: Secondary | ICD-10-CM

## 2021-11-18 DIAGNOSIS — G8929 Other chronic pain: Secondary | ICD-10-CM | POA: Diagnosis not present

## 2021-11-18 DIAGNOSIS — M25511 Pain in right shoulder: Secondary | ICD-10-CM | POA: Diagnosis not present

## 2021-11-18 DIAGNOSIS — M25552 Pain in left hip: Secondary | ICD-10-CM

## 2021-11-24 DIAGNOSIS — F331 Major depressive disorder, recurrent, moderate: Secondary | ICD-10-CM | POA: Diagnosis not present

## 2021-11-24 NOTE — Therapy (Signed)
?OUTPATIENT PHYSICAL THERAPY TREATMENT NOTE ? ? ?Patient Name: Robin [name] ?MRN: [id number] ?DOB:04-Nov-1970, 51 y.o., female ?Today's Date: 11/25/2021 ? ?PCP: Sandrea Hughs, NP ?REFERRING PROVIDER: Ofilia Neas, PA-C ? ? PT End of Session - 11/25/21 0850   ? ? Visit Number 5   ? Number of Visits 12   ? Date for PT Re-Evaluation 01/20/22   ? Authorization Type BCBS   ? PT Start Time [phone]   ? PT Stop Time [phone]   ? PT Time Calculation (min) 48 min   ? Activity Tolerance Patient tolerated treatment well   ? Behavior During Therapy [facility] for tasks assessed/performed   ? ?  ?  ? ?  ? ? ? ? ?Past Medical History:  ?Diagnosis Date  ? ADD (attention deficit disorder)   ? Allergy   ? Anemia   ? Anorexia nervosa with bulimia   ? Per [facility] New Patient Packet  ? Anxiety   ? Asthma   ? Carpal tunnel syndrome   ? Per [facility] New Patient Packet  ? Depression   ? Dry eyes   ? Per [facility] New Patient Packet  ? Dry mouth   ? Per [facility] New Patient Packet  ? Fibroids   ? Per [facility] New Patient Packet  ? History of hip x-ray 2021  ? Per [facility] New Patient Packet  ? History of Papanicolaou smear of cervix 2021  ? Per [facility] New Patient Packet, Dr.Pickens  ? Hyperlipidemia   ? Ileitis   ? Lupus ([place])   ? PONV (postoperative nausea and vomiting)   ? mild nausea   ? Raynaud disease   ? Retinal tear   ? Per [facility] New Patient Packet  ? Systemic lupus erythematosus ([place])   ? ?Past Surgical History:  ?Procedure Laterality Date  ? CARPAL TUNNEL RELEASE Right   ? 08/2019  ? CESAREAN SECTION  2003  ? CESAREAN SECTION  2005  ? Per [facility] New Patient Packet  ? COLONOSCOPY  2017  ? Dr.Jacobs  ? DENTAL SURGERY  2009  ? Fromed tooth and chronic infection  ? DILITATION & CURRETTAGE/HYSTROSCOPY WITH NOVASURE ABLATION N/A 06/03/2020  ? Procedure: DILATATION & CURETTAGE/HYSTEROSCOPY WITH NOVASURE ABLATION;  Surgeon: Aletha Halim, MD;  Location: [place];  Service: Gynecology;  Laterality: N/A;  ? ENDOMETRIAL BIOPSY  04/20/2020  ?    ? EYE SURGERY Bilateral    ? cryoretinopexy  ? ?Patient Active Problem List  ? Diagnosis Date Noted  ? [place] 04/14/2021  ? 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 Seasonal and perennial allergic rhinitis 11/06/2018  ? Mild intermittent asthma without complication 95/28/4132  ? Flexural atopic dermatitis 11/06/2018  ? History of hyperlipidemia 11/08/2016  ? History of anxiety 11/08/2016  ? History of retinal tear 11/08/2016  ? History of Bilateral plantar fasciitis 08/10/2016  ? High risk medication use 07/04/2016  ? Lupus ([place]) 05/23/2016  ? 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 ADHD (attention deficit hyperactivity disorder) 12/22/2014  ? Raynaud's phenomenon 10/17/2013  ? Hyperlipidemia 09/28/2011  ? ? ?REFERRING DIAG: M24.9 (ICD-10-CM) - Hypermobility of joint M53.3,G89.29 (ICD-10-CM) - Chronic left SI joint pain  ? ?THERAPY DIAG:  ?Hypermobility syndrome ? ?Polyarthralgia ? ?Radiculopathy, lumbosacral region ? ?Pain in left hip ? ?Chronic right shoulder pain ? ?Muscle weakness (generalized) ? ?PERTINENT HISTORY: see snapshot  ? ?PRECAUTIONS: none ? ?SUBJECTIVE:  ?Hands and feet and knees hurt.  I can feel my back instability.  Can you tape my shoulder again it  helped.  I was sore after last visit and I want to know how to mimick that at home (Reformer)  ? ? ? PAIN:  ?Are you having pain? Yes ?NPRS scale: 5/10 ?Pain location: hands and feet and knees  ?Pain orientation: Bilateral  ?PAIN TYPE: aching, tight , sore  ?Pain description: intermittent  ?Aggravating factors: maybe more workouts, PM it "clenches" in low back  ?Relieving factors: HEP ? ? ?OBJECTIVE:  ?  ?DIAGNOSTIC FINDINGS:  ?None recent in SIJ ?  ?PATIENT SURVEYS:  ?Modified Oswestry NT   ?  ?SCREENING FOR RED FLAGS: ?Bowel or bladder incontinence: No ?Spinal tumors: No ?Cauda equina syndrome: No ?Compression fracture: No ?Abdominal aneurysm: No ?  ?COGNITION: ?         Overall cognitive status: Within functional limits for tasks assessed              ?          ?SENSATION: ?          Light touch: Appears intact ?         Stereognosis: Appears intact ?         Hot/Cold: Appears intact ?         Proprioception: Appears intact ?  ?POSTURE:  ?Rounded shoulders , L shoulder more forward than Rt  ?Lt PSIS higher than Rt in prone  ?Lt ASIS higher in supine than Rt  ?  ?  ?PALPATION: ?Pain along L lumbo sacral spine , paraspinals from low thoracic to low lumbar.  Palpation on Rt increased L sided "pressure".  Pressure with palpation on lateral SI border.  ?  ?LUMBARAROM/PROM ?  ?A/PROM A/PROM  ?10/12/2021  ?Flexion Palms to floor   ?Extension Painful 25%  ?Right lateral flexion Pain R   ?Left lateral flexion Pain R   ?Right rotation WNL  ?Lt rotation WNL   ? (Blank rows = not tested) ?  ?LE AROM/PROM:     WNL throughout  ?  ?  ?LE MMT: ?  ?MMT Right ?10/12/2021 Left ?10/12/2021  ?Hip flexion      ?Hip extension 5/5 5/5  ?Hip abduction 5/5 5/5  ?Hip adduction      ?Hip internal rotation      ?Hip external rotation      ?Knee flexion 5/5 5/5  ?Knee extension 5/5 5/5  ?Ankle dorsiflexion      ?Ankle plantarflexion      ?Ankle inversion      ?Ankle eversion      ? (Blank rows = not tested) ?  ?LUMBAR SPECIAL TESTS:  ?Prone instability test: NT.  Pain with SIJ compression.   Pain on L with Rt SLS,  no pain with Lt SLS  ?  ?FUNCTIONAL TESTS:  ?Single leg stance limited on L with pain in L lateral lumbar  region.  ?  ?  ?GAIT: ?Comments: No issues  ?  ?[facility] Adult PT Treatment:                                                DATE: 11/24/21 ?Therapeutic Exercise: ?Bird dog x 10 , 5 sec hold  ?Primal plank 15 sec x 5 ?Slider lunge (glutes) with towel x 10, cues for level pelvis ?Warrior 3   hip hinge x 10  ?Isometric hip abd , glute med into the wall  ?Sit  to stand single leg x 10 with 1 inch pad under hips.   ?Done again with 10 lbs wgt x 10 each side ?Lateral band walking with blue band and then without, tried with band around feet as an alternative ?Shoulder horizontal pull, ER and then behind back for  scapular work (demo mostly) ?Manual Therapy: ?Kinesiotape to Rt shoulder for support , 3 Y tapes ?Infraspinatus, deltoid and pec minor  ?Self Care: ?Fine tune HEP, hinge vs squat, taping, POC  ? ?[facility] Adult PT Treatment:                                                DATE: 11/17/21 ?Therapeutic Exercise: ? Elliptical 5 min L 12 ramp, L 5 resistance  ?Reformer  ?Scooter 1 red spring ?Press back  ?Lunge  ?Single leg hip hinge  ?Curtsy ?Footwork 2 red 1 blue 1 yellow  ?Parallel on heels, hip turnout ?Parallel on toes then heels raises ? ? ?Manual Therapy: ? ?[facility] Adult PT Treatment:                                                DATE: 11-02-21 ?Therapeutic Exercise: ?Posture control and awareness of rib flaring in standing. Then added ?Airex pad with star pattern RTB for UE with VC for pt hyperextending knees and swayback. ?Added SLS with forward kicks and side kicks. ?Manual Therapy: ?KT tape triangle window over Left SI. Transverse I strip over low back horizontally 50 % and two I strips 50% vertically along bil paraspinals ?STW of left QL and gluteals and Left SI ?Trigger Point Dry Needling Treatment: ?Pre-treatment instruction: Patient instructed on dry needling rationale, procedures, and possible side effects including pain during treatment (achy,cramping feeling), bruising, drop of blood, lightheadedness, nausea, sweating. ?Patient Consent Given: Yes ?Education handout provided: Yes ?Muscles treated: Left QL lumbar 2- to SI, paraspinals Left gluteals,   ?Needle size and number: .30x70m x 2 and .30x743mx 4 ?Electrical stimulation performed: No ?Parameters: N/A ?Treatment response/outcome: Twitch response elicited and Palpable decrease in muscle tension ?Post-treatment instructions: Patient instructed to expect possible mild to moderate muscle soreness later today and/or tomorrow. Patient instructed in methods to reduce muscle soreness and to continue prescribed HEP. If patient was dry needled over the lung field,  patient was instructed on signs and symptoms of pneumothorax and, however unlikely, to see immediate medical attention should they occur. Patient was also educated on signs and symptoms of infection and to

## 2021-11-25 ENCOUNTER — Ambulatory Visit: Payer: BC Managed Care – PPO | Admitting: Physical Therapy

## 2021-11-25 ENCOUNTER — Other Ambulatory Visit: Payer: Self-pay

## 2021-11-25 ENCOUNTER — Ambulatory Visit (INDEPENDENT_AMBULATORY_CARE_PROVIDER_SITE_OTHER): Payer: BC Managed Care – PPO

## 2021-11-25 DIAGNOSIS — M2550 Pain in unspecified joint: Secondary | ICD-10-CM | POA: Diagnosis not present

## 2021-11-25 DIAGNOSIS — M6281 Muscle weakness (generalized): Secondary | ICD-10-CM | POA: Diagnosis not present

## 2021-11-25 DIAGNOSIS — M25552 Pain in left hip: Secondary | ICD-10-CM | POA: Diagnosis not present

## 2021-11-25 DIAGNOSIS — M25511 Pain in right shoulder: Secondary | ICD-10-CM | POA: Diagnosis not present

## 2021-11-25 DIAGNOSIS — M357 Hypermobility syndrome: Secondary | ICD-10-CM | POA: Diagnosis not present

## 2021-11-25 DIAGNOSIS — J309 Allergic rhinitis, unspecified: Secondary | ICD-10-CM | POA: Diagnosis not present

## 2021-11-25 DIAGNOSIS — M5417 Radiculopathy, lumbosacral region: Secondary | ICD-10-CM | POA: Diagnosis not present

## 2021-11-25 DIAGNOSIS — G8929 Other chronic pain: Secondary | ICD-10-CM | POA: Diagnosis not present

## 2021-11-25 IMAGING — US US PELVIS COMPLETE WITH TRANSVAGINAL
2 series · 13 of 25 positions shown · non-contrast
Comparison: 10/05/2018

CLINICAL DATA: Abnormal bleeding with pain, heavy frequent cycles
every other week, history of Caesarean section, uterine fibroids;
LMP 02/17/2020



[Series 1: us pelvis complete with transvaginal · 0.25mm/px · 81 acquisitions, 12 frames shown (1 of 2)]
[im 1/81]
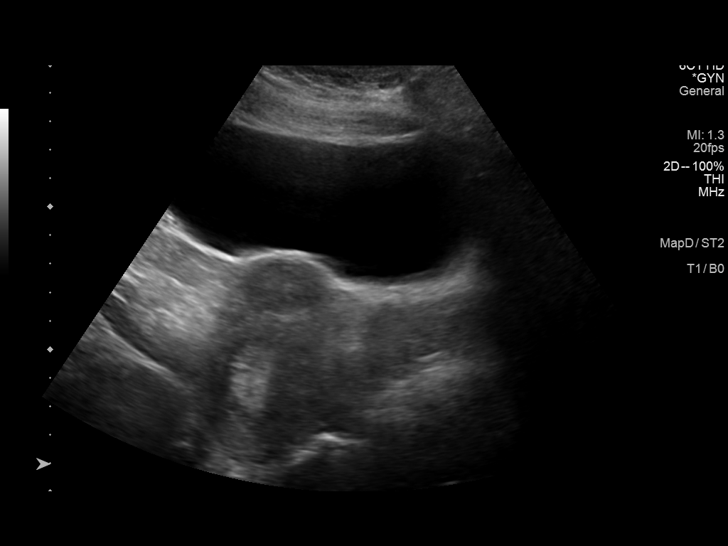
[im 7/81]
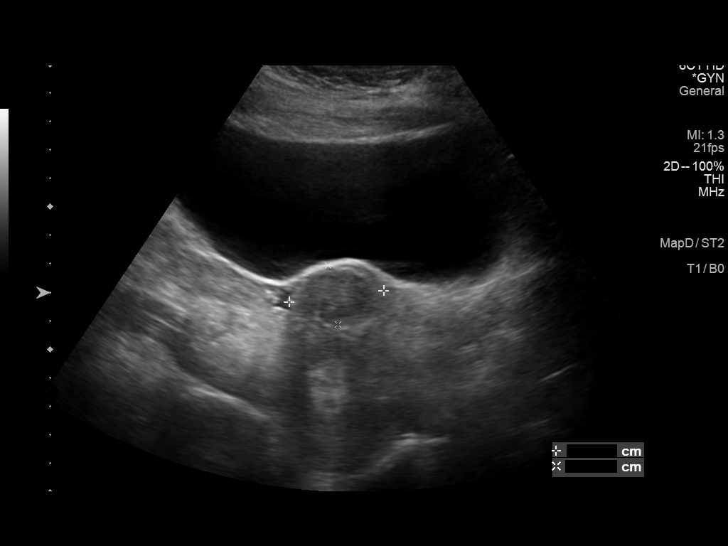
[im 14/81]
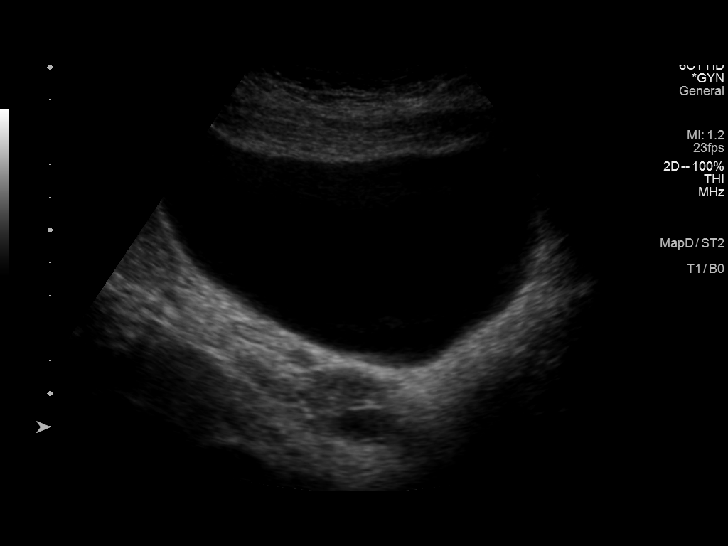
[im 21/81]
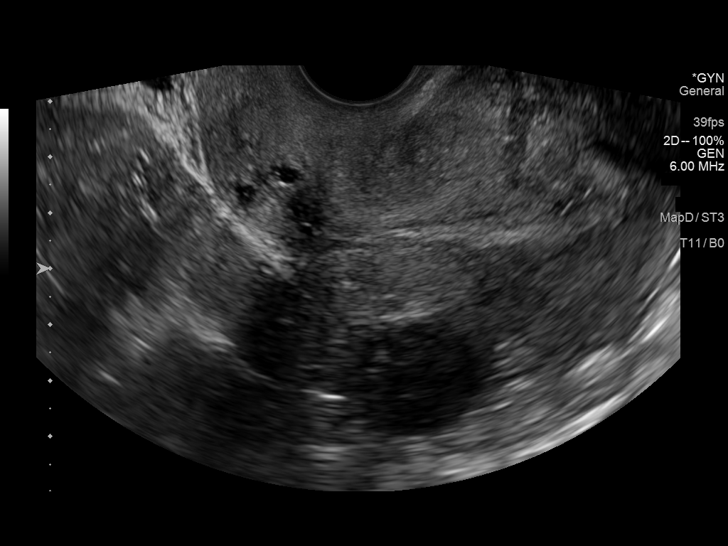
[im 28/81]
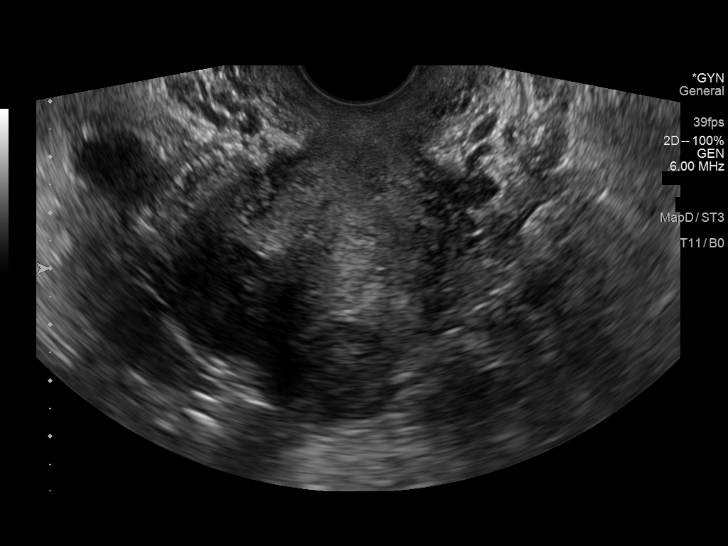
[im 35/81]
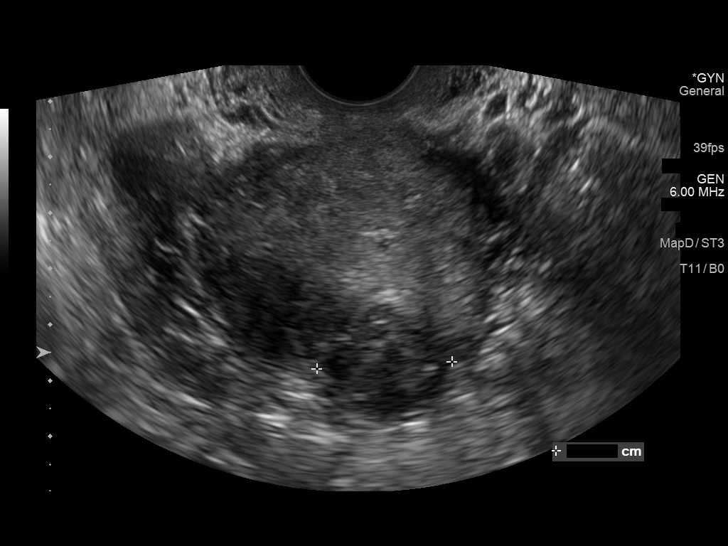
[im 42/81]
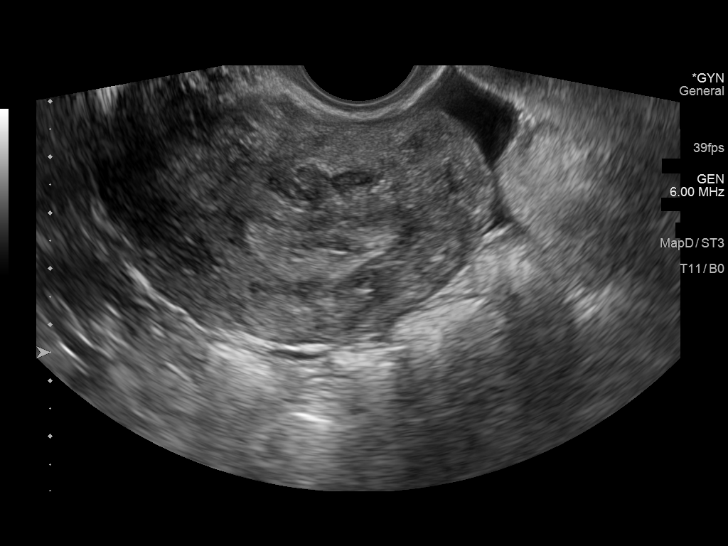
[im 49/81]
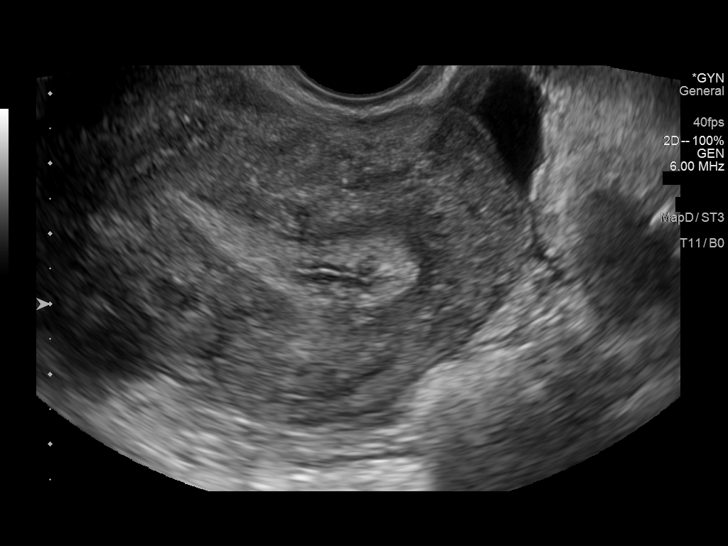
[im 56/81]
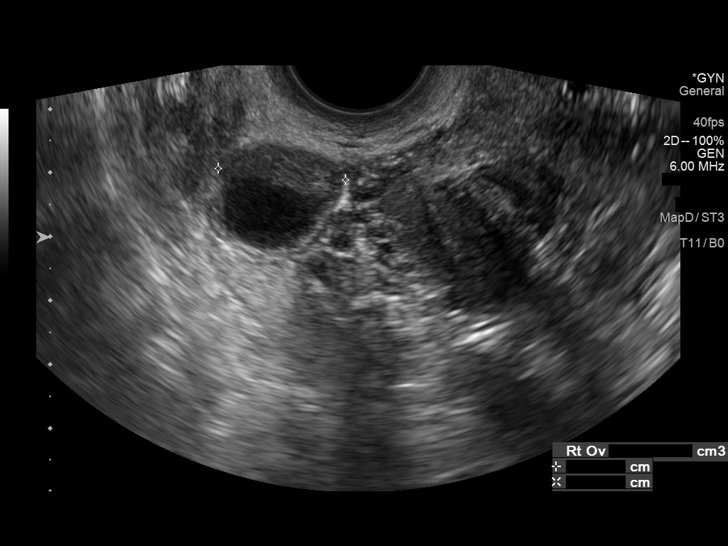
[im 63/81]
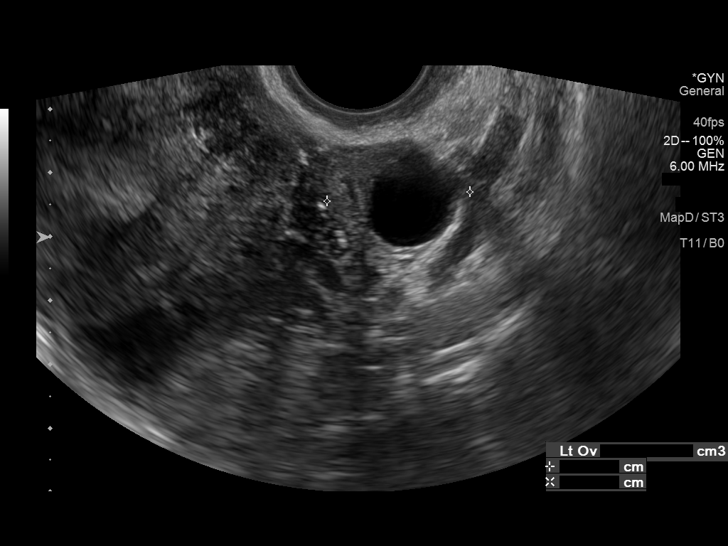
[im 70/81]
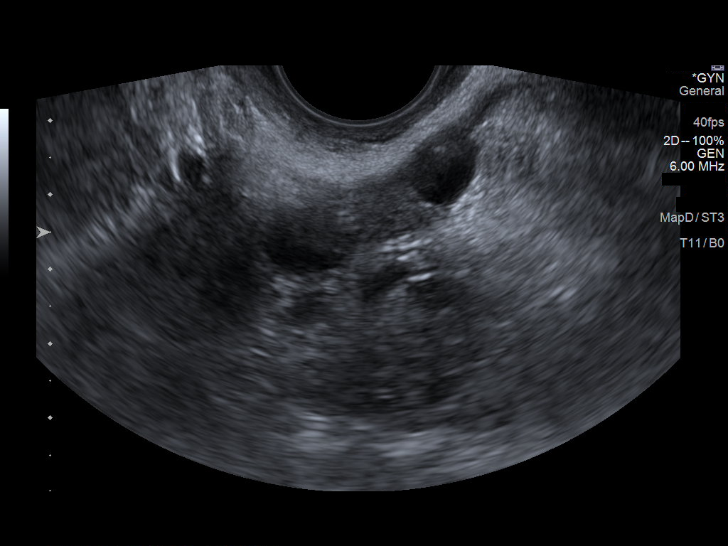
[im 77/81]
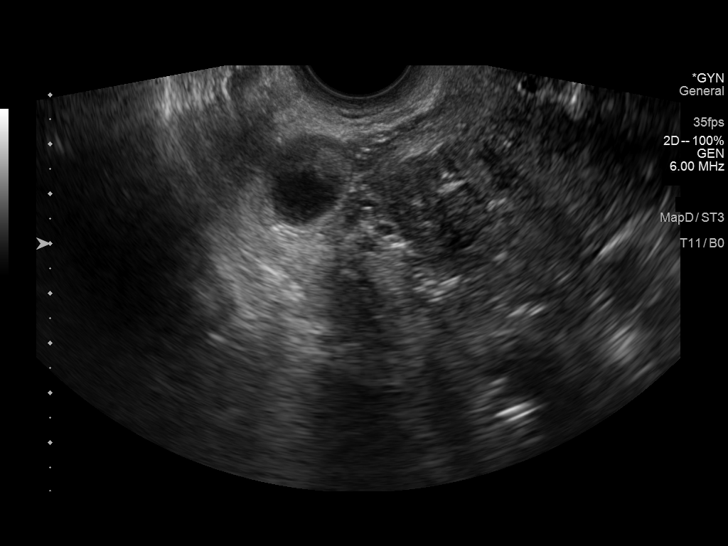

[Series 1001: us pelvis complete with transvaginal · 0.13mm/px · 1 of 1 slices shown (2 of 2)]
[im 1/1]
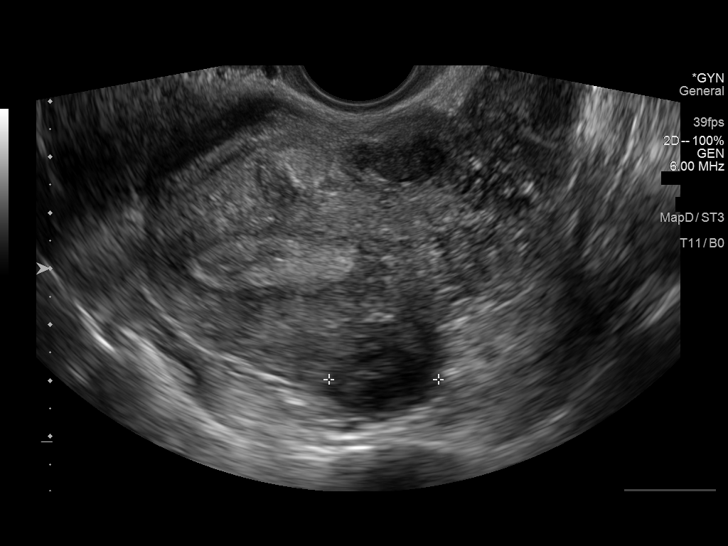

[13 of 25 positions shown; findings below may reference images not displayed]

FINDINGS: Uterus

Measurements: 10.8 x 5.4 x 5.5 cm = volume: 167 mL. Retroverted.
Heterogeneous myometrium. Multiple uterine leiomyomata are
identified, all subserosal or intramural, largest lesions measuring
2.9 cm, 2.8 cm, and 2.8 cm in greatest sizes. No definite submucosal
leiomyomata are identified. Nabothian cyst at cervix.

Endometrium

Thickness: 10 mm.  No endometrial fluid or focal abnormality

Right ovary

Measurements: 3.2 x 2.9 x 2.0 cm = volume: 9.5 mL. Dominant follicle
without mass

Left ovary

Measurements: 3.9 x 2.4 x 2.2 cm = volume: 11.2 mL. Dominant
follicle. Additional small corpus luteum. No additional masses.

Other findings

Small amount of nonspecific free pelvic fluid.  No adnexal masses.
IMPRESSION: Multiple uterine leiomyomata, all subserosal to intramural without
definite submucosal extension.

Remainder of exam unremarkable.

## 2021-11-26 DIAGNOSIS — Z03818 Encounter for observation for suspected exposure to other biological agents ruled out: Secondary | ICD-10-CM | POA: Diagnosis not present

## 2021-11-26 DIAGNOSIS — U071 COVID-19: Secondary | ICD-10-CM | POA: Diagnosis not present

## 2021-12-02 ENCOUNTER — Ambulatory Visit: Payer: BC Managed Care – PPO | Admitting: Physical Therapy

## 2021-12-08 DIAGNOSIS — F331 Major depressive disorder, recurrent, moderate: Secondary | ICD-10-CM | POA: Diagnosis not present

## 2021-12-08 NOTE — Therapy (Signed)
?OUTPATIENT PHYSICAL THERAPY TREATMENT NOTE ? ? ?Patient Name: Robin [name] ?MRN: [id number] ?DOB:08/13/1971, 51 y.o., female ?Today's Date: 12/09/2021 ? ?PCP: Sandrea Hughs, NP ?REFERRING PROVIDER: Ofilia Neas, PA-C ? ? PT End of Session - 12/09/21 1837   ? ? Visit Number 6   ? Number of Visits 12   ? Date for PT Re-Evaluation 01/20/22   ? Authorization Type BCBS   ? PT Start Time 1837   Pt arrived 7 minutes late  ? PT Stop Time 1915   ? PT Time Calculation (min) 38 min   ? Activity Tolerance Patient tolerated treatment well   ? Behavior During Therapy [facility] for tasks assessed/performed   ? ?  ?  ? ?  ? ? ? ? ? ?Past Medical History:  ?Diagnosis Date  ? ADD (attention deficit disorder)   ? Allergy   ? Anemia   ? Anorexia nervosa with bulimia   ? Per [facility] New Patient Packet  ? Anxiety   ? Asthma   ? Carpal tunnel syndrome   ? Per [facility] New Patient Packet  ? Depression   ? Dry eyes   ? Per [facility] New Patient Packet  ? Dry mouth   ? Per [facility] New Patient Packet  ? Fibroids   ? Per [facility] New Patient Packet  ? History of hip x-ray 2021  ? Per [facility] New Patient Packet  ? History of Papanicolaou smear of cervix 2021  ? Per [facility] New Patient Packet, Dr.Pickens  ? Hyperlipidemia   ? Ileitis   ? Lupus ([place])   ? PONV (postoperative nausea and vomiting)   ? mild nausea   ? Raynaud disease   ? Retinal tear   ? Per [facility] New Patient Packet  ? Systemic lupus erythematosus ([place])   ? ?Past Surgical History:  ?Procedure Laterality Date  ? CARPAL TUNNEL RELEASE Right   ? 08/2019  ? CESAREAN SECTION  2003  ? CESAREAN SECTION  2005  ? Per [facility] New Patient Packet  ? COLONOSCOPY  2017  ? Dr.Jacobs  ? DENTAL SURGERY  2009  ? Fromed tooth and chronic infection  ? DILITATION & CURRETTAGE/HYSTROSCOPY WITH NOVASURE ABLATION N/A 06/03/2020  ? Procedure: DILATATION & CURETTAGE/HYSTEROSCOPY WITH NOVASURE ABLATION;  Surgeon: Aletha Halim, MD;  Location: [place];  Service: Gynecology;  Laterality: N/A;  ? ENDOMETRIAL BIOPSY  04/20/2020   ?    ? EYE SURGERY Bilateral   ? cryoretinopexy  ? ?Patient Active Problem List  ? Diagnosis Date Noted  ? [place] 04/14/2021  ? 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 Seasonal and perennial allergic rhinitis 11/06/2018  ? Mild intermittent asthma without complication 89/37/3428  ? Flexural atopic dermatitis 11/06/2018  ? History of hyperlipidemia 11/08/2016  ? History of anxiety 11/08/2016  ? History of retinal tear 11/08/2016  ? History of Bilateral plantar fasciitis 08/10/2016  ? High risk medication use 07/04/2016  ? Lupus ([place]) 05/23/2016  ? 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 ADHD (attention deficit hyperactivity disorder) 12/22/2014  ? Raynaud's phenomenon 10/17/2013  ? Hyperlipidemia 09/28/2011  ? ? ?REFERRING DIAG: M24.9 (ICD-10-CM) - Hypermobility of joint M53.3,G89.29 (ICD-10-CM) - Chronic left SI joint pain  ? ?THERAPY DIAG:  ?Hypermobility syndrome ? ?Polyarthralgia ? ?Radiculopathy, lumbosacral region ? ?Pain in left hip ? ?Chronic right shoulder pain ? ?Muscle weakness (generalized) ? ?PERTINENT HISTORY: see snapshot  ? ?PRECAUTIONS: none ? ?SUBJECTIVE:  ?Pt reports doing alright, although she reports increased low back stiffness. She reports doing  her HEP about 3 times per week. She reports 2/10 BIL foot pain, but no hand or knee pain.  ? ? ? PAIN:  ?Are you having pain? Yes ?NPRS scale: 5/10 ?Pain location: hands and feet and knees  ?Pain orientation: Bilateral  ?PAIN TYPE: aching, tight , sore  ?Pain description: intermittent  ?Aggravating factors: maybe more workouts, PM it "clenches" in low back  ?Relieving factors: HEP ? ? ?OBJECTIVE:  ? *Unless otherwise noted, objective information collected previously* ? ?DIAGNOSTIC FINDINGS:  ?None recent in SIJ ?  ?PATIENT SURVEYS:  ?Modified Oswestry NT   ?  ?SCREENING FOR RED FLAGS: ?Bowel or bladder incontinence: No ?Spinal tumors: No ?Cauda equina syndrome: No ?Compression fracture: No ?Abdominal aneurysm: No ?  ?COGNITION: ?         Overall cognitive  status: Within functional limits for tasks assessed              ?          ?SENSATION: ?         Light touch: Appears intact ?         Stereognosis: Appears intact ?         Hot/Cold: Appears intact ?         Proprioception: Appears intact ?  ?POSTURE:  ?Rounded shoulders , L shoulder more forward than Rt  ?Lt PSIS higher than Rt in prone  ?Lt ASIS higher in supine than Rt  ?  ?  ?PALPATION: ?Pain along L lumbo sacral spine , paraspinals from low thoracic to low lumbar.  Palpation on Rt increased L sided "pressure".  Pressure with palpation on lateral SI border.  ?  ?LUMBARAROM/PROM ?  ?A/PROM A/PROM  ?10/12/2021  ?Flexion Palms to floor   ?Extension Painful 25%  ?Right lateral flexion Pain R   ?Left lateral flexion Pain R   ?Right rotation WNL  ?Lt rotation WNL   ? (Blank rows = not tested) ?  ?LE AROM/PROM:     WNL throughout  ?  ?  ?LE MMT: ?  ?MMT Right ?10/12/2021 Left ?10/12/2021  ?Hip flexion      ?Hip extension 5/5 5/5  ?Hip abduction 5/5 5/5  ?Hip adduction      ?Hip internal rotation      ?Hip external rotation      ?Knee flexion 5/5 5/5  ?Knee extension 5/5 5/5  ?Ankle dorsiflexion      ?Ankle plantarflexion      ?Ankle inversion      ?Ankle eversion      ? (Blank rows = not tested) ?  ?LUMBAR SPECIAL TESTS:  ?Prone instability test: NT.  Pain with SIJ compression.   Pain on L with Rt SLS,  no pain with Lt SLS  ?  ?FUNCTIONAL TESTS:  ?Single leg stance limited on L with pain in L lateral lumbar  region.  ?  ?  ?GAIT: ?Comments: No issues  ?  ?[facility] Adult PT Treatment:                                                DATE: 12/09/2021 ?Therapeutic Exercise: ?Controlled single leg squat descent to table 2x10 BIL ?Squat false hops onto toes with waist attachment and two 10# cables at [employer] machine 3x10 ?Cross body knee drives in SLS with thigh attachment and 3# cable at [employer]  x10 BIL ?Standing windmill stretch x10 BIL ?Manual Therapy: ?Skilled palpation to identify trigger points prior to  TPDN ?Effleurage to Lt lumbar paraspinals following TPDN ?Sidelying grade 4 lumbar roll mobilization throughout lumbar spine x1 BIL ?Neuromuscular re-ed: ?N/A ?Therapeutic Activity: ?N/A ?Modalities: ?N/A ?Self Care: ?N/A ? ? ? Trigger Point Dry-Needling  ?Treatment instructions: Expect mild to moderate muscle soreness. S/S of pneumothorax if dry needled over a lung field, and to seek immediate medical attention should they occur. Patient verbalized understanding of these instructions and education. ? ?Patient Consent Given: Yes ?Education handout provided: Yes ?Muscles treated: Lt lumbar multifidi L2-L3 in prone, Lt quadratus lumborum in sidelying ?Electrical stimulation performed: No ?Parameters: N/A ?Treatment response/outcome: two .30x50 needles placed into L2-L3 lumbar multifidi and left in situ x3 minutes after brief pistoning, followed by sidelying .75x50 needle places in Lt quadratus lumborum. Both result in improved muscle extensibility. No adverse effects noted.  ? ? ?[facility] Adult PT Treatment:                                                DATE: 11/24/21 ?Therapeutic Exercise: ?Bird dog x 10 , 5 sec hold  ?Primal plank 15 sec x 5 ?Slider lunge (glutes) with towel x 10, cues for level pelvis ?Warrior 3   hip hinge x 10  ?Isometric hip abd , glute med into the wall  ?Sit to stand single leg x 10 with 1 inch pad under hips.   ?Done again with 10 lbs wgt x 10 each side ?Lateral band walking with blue band and then without, tried with band around feet as an alternative ?Shoulder horizontal pull, ER and then behind back for scapular work (demo mostly) ?Manual Therapy: ?Kinesiotape to Rt shoulder for support , 3 Y tapes ?Infraspinatus, deltoid and pec minor  ?Self Care: ?Fine tune HEP, hinge vs squat, taping, POC  ? ?[facility] Adult PT Treatment:                                                DATE: 11/17/21 ?Therapeutic Exercise: ? Elliptical 5 min L 12 ramp, L 5 resistance  ?Reformer  ?Scooter 1 red spring ?Press back   ?Lunge  ?Single leg hip hinge  ?Curtsy ?Footwork 2 red 1 blue 1 yellow  ?Parallel on heels, hip turnout ?Parallel on toes then heels raises ? ? ? ?  ?  ?PATIENT EDUCATION:  ?Education details: PT/POC, SIJ , jo

## 2021-12-09 ENCOUNTER — Ambulatory Visit: Payer: BC Managed Care – PPO

## 2021-12-09 ENCOUNTER — Ambulatory Visit (INDEPENDENT_AMBULATORY_CARE_PROVIDER_SITE_OTHER): Payer: BC Managed Care – PPO

## 2021-12-09 DIAGNOSIS — G8929 Other chronic pain: Secondary | ICD-10-CM | POA: Diagnosis not present

## 2021-12-09 DIAGNOSIS — M357 Hypermobility syndrome: Secondary | ICD-10-CM

## 2021-12-09 DIAGNOSIS — M25552 Pain in left hip: Secondary | ICD-10-CM | POA: Diagnosis not present

## 2021-12-09 DIAGNOSIS — M6281 Muscle weakness (generalized): Secondary | ICD-10-CM

## 2021-12-09 DIAGNOSIS — J309 Allergic rhinitis, unspecified: Secondary | ICD-10-CM | POA: Diagnosis not present

## 2021-12-09 DIAGNOSIS — M5417 Radiculopathy, lumbosacral region: Secondary | ICD-10-CM | POA: Diagnosis not present

## 2021-12-09 DIAGNOSIS — M2550 Pain in unspecified joint: Secondary | ICD-10-CM

## 2021-12-09 DIAGNOSIS — M25511 Pain in right shoulder: Secondary | ICD-10-CM | POA: Diagnosis not present

## 2021-12-09 NOTE — Patient Instructions (Signed)

## 2021-12-09 NOTE — Progress Notes (Signed)
? ?Office Visit Note ? ?Patient: Robin [name]             ?Date of Birth: 06-13-1971           ?MRN: [id number]             ?PCP: Ngetich, Nelda Bucks, NP ?Referring: Ngetich, Nelda Bucks, NP ?Visit Date: 12/23/2021 ?Occupation: @GUAROCC @ ? ?Subjective:  ?Medication management ? ?History of Present Illness: Robin [name] is a 51 y.o. female with history of systemic lupus dermatosis and osteoarthritis.  She continues to have sicca symptoms and Raynaud's phenomenon.  She states the Raynaud's symptoms have been better since the weather has become warmer.  She has not had a recent overall also.  She continues to have pain and stiffness in her bilateral hands and her bilateral feet.  She has not noticed any significant joint swelling.  She has off-and-on discomfort in her right shoulder.  She has not seen Dr. Erlinda Hong yet for paresthesias in her hands.  She is seeing an integrative doctor and is getting some further labs. ? ?Activities of Daily Living:  ?Patient reports morning stiffness for 1-2 hours.   ?Patient Reports nocturnal pain.  ?Difficulty dressing/grooming: Denies ?Difficulty climbing stairs: Denies ?Difficulty getting out of chair: Denies ?Difficulty using hands for taps, buttons, cutlery, and/or writing: Reports ? ?Review of Systems  ?Constitutional:  Positive for fatigue.  ?HENT:  Positive for mouth dryness and nose dryness. Negative for mouth sores.   ?Eyes:  Positive for pain, itching and dryness.  ?Respiratory:  Negative for shortness of breath and difficulty breathing.   ?Cardiovascular:  Negative for chest pain and palpitations.  ?Gastrointestinal:  Negative for constipation and diarrhea.  ?Endocrine: Negative for increased urination.  ?Genitourinary:  Negative for difficulty urinating.  ?Musculoskeletal:  Positive for joint pain, joint pain, joint swelling, myalgias, morning stiffness, muscle tenderness and myalgias.  ?Skin:  Positive for color change and rash. Negative for sensitivity to sunlight.   ?Allergic/Immunologic: Negative for susceptible to infections.  ?Neurological:  Positive for dizziness, numbness and parasthesias. Negative for headaches and memory loss.  ?Hematological:  Negative for bruising/bleeding tendency.  ?Psychiatric/Behavioral:  Positive for depressed mood and sleep disturbance. Negative for confusion. The patient is nervous/anxious.   ? ?PMFS History:  ?Patient Active Problem List  ? Diagnosis Date Noted  ? [place] 04/14/2021  ? 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 Seasonal and perennial allergic rhinitis 11/06/2018  ? Mild intermittent asthma without complication 65/78/4696  ? Flexural atopic dermatitis 11/06/2018  ? History of hyperlipidemia 11/08/2016  ? History of anxiety 11/08/2016  ? History of retinal tear 11/08/2016  ? History of Bilateral plantar fasciitis 08/10/2016  ? High risk medication use 07/04/2016  ? Lupus ([place]) 05/23/2016  ? 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 ADHD (attention deficit hyperactivity disorder) 12/22/2014  ? Raynaud's phenomenon 10/17/2013  ? Hyperlipidemia 09/28/2011  ?  ?Past Medical History:  ?Diagnosis Date  ? ADD (attention deficit disorder)   ? Allergy   ? Anemia   ? Anorexia nervosa with bulimia   ? Per [facility] New Patient Packet  ? Anxiety   ? Asthma   ? Carpal tunnel syndrome   ? Per [facility] New Patient Packet  ? Depression   ? Dry eyes   ? Per [facility] New Patient Packet  ? Dry mouth   ? Per [facility] New Patient Packet  ? Fibroids   ? Per [facility] New Patient Packet  ? History of hip x-ray 2021  ? Per [place]  Patient Packet  ? History of Papanicolaou smear of cervix 2021  ? Per [facility] New Patient Packet, Dr.Pickens  ? Hyperlipidemia   ? Ileitis   ? Lupus ([place])   ? PONV (postoperative nausea and vomiting)   ? mild nausea   ? Raynaud disease   ? Retinal tear   ? Per [facility] New Patient Packet  ? Systemic lupus erythematosus ([place])   ?  ?Family History  ?Problem Relation Age of Onset  ? Heart disease Mother 14  ?     CAD/stenting  ? Hyperlipidemia Mother   ? Arthritis Mother    ?     ostearthritis  ? Hypertension Mother   ? Kidney disease Mother   ? Irritable bowel syndrome Mother   ? Stroke Mother 31  ?     CVA  ? Fibromyalgia Mother   ? Eczema Mother   ? Sjogren's syndrome Mother   ? Hyperlipidemia Father   ? Neuropathy Father   ? Allergies Father   ?     shellfish  ? Prostatitis Father   ? Hyperlipidemia Sister   ? ADD / ADHD Sister   ? Bipolar disorder Sister   ? Depression Sister   ? Hypertension Brother   ? OCD Brother   ? Anxiety disorder Daughter   ? ADD / ADHD Daughter   ? Asthma Daughter   ? OCD Son   ? Colon cancer Neg Hx   ? Inflammatory bowel disease Neg Hx   ? Allergic rhinitis Neg Hx   ? Angioedema Neg Hx   ? Atopy Neg Hx   ? Immunodeficiency Neg Hx   ? Urticaria Neg Hx   ? ?Past Surgical History:  ?Procedure Laterality Date  ? CARPAL TUNNEL RELEASE Right   ? 08/2019  ? CESAREAN SECTION  2003  ? CESAREAN SECTION  2005  ? Per [facility] New Patient Packet  ? COLONOSCOPY  2017  ? Dr.Jacobs  ? DENTAL SURGERY  2009  ? Fromed tooth and chronic infection  ? DILITATION & CURRETTAGE/HYSTROSCOPY WITH NOVASURE ABLATION N/A 06/03/2020  ? Procedure: DILATATION & CURETTAGE/HYSTEROSCOPY WITH NOVASURE ABLATION;  Surgeon: Aletha Halim, MD;  Location: [place];  Service: Gynecology;  Laterality: N/A;  ? ENDOMETRIAL BIOPSY  04/20/2020  ?    ? EYE SURGERY Bilateral   ? cryoretinopexy  ? ?Social History  ? ?Social History Narrative  ? Marital status: married x 17 years; happily married  ?    Children: 2 children (15, 13)  ?    Lives: with husband, 2 children.  ?    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    Tobacco: none  ?    Alcohol: 1-2 glasses per night  ?    Exercise: 3 times per week  ?      ? Per [facility] New Patient Packet abstracted on 04/02/2021:  ? Diet: Avoid red meat   ?   ? Caffeine: 2 cups of coffee daily   ?   ? Married, if yes what year: yes, 2001  ?   ? Do you live in a house, apartment, assisted  living, condo, trailer, ect: House  ?   ? Is it one or more stories: No  ?   ? How many persons live in your home? 4 (including me)   ?   ? Pets: 1 dog, 2 cats   ?   ? Highest level or education completed:  Undergraduate degree   ?   ? Current/Past profession: Actuary   ?   ? Exercise:        Yes          Type and how often: 30-60 min walking 5 days a week. Pilate's 1 x a week, and horseback riding 2 times a week   ?   ?   ? Living Will: No  ? DNR: no, would like to discuss one   ? POA/HPOA: No  ?   ? Functional Status:  ? Do you have difficulty bathing or dressing yourself? Left blank   ? Do you have difficulty preparing food or eating? Left blank  ? Do you have difficulty managing your medications? Left blank  ? Do you have difficulty managing your finances? Left blank  ? Do you have difficulty affording your medications? Left blank  ? ?Immunization History  ?Administered Date(s) Administered  ? Influenza,inj,Quad PF,6+ Mos 05/12/2014, 11/27/2015, 05/23/2016, 10/30/2017, 07/30/2018  ? Moderna Sars-Covid-2 Vaccination 10/27/2020, 10/31/2020  ? PFIZER(Purple Top)SARS-COV-2 Vaccination 12/06/2019, 12/16/2019, 05/27/2020  ? Pneumococcal Conjugate-13 05/23/2016  ? Tdap 09/28/2011, 08/12/2020  ?  ? ?Objective: ?Vital Signs: BP 117/75 (BP Location: Left Arm, Patient Position: Sitting, Cuff Size: Normal)   Pulse 73   Ht 5' 6"  (1.676 m)   Wt 159 lb 6.4 oz (72.3 kg)   BMI 25.73 kg/m?   ? ?Physical Exam ?Vitals and nursing note reviewed.  ?Constitutional:   ?   Appearance: She is well-developed.  ?HENT:  ?   Head: Normocephalic and atraumatic.  ?Eyes:  ?   Conjunctiva/sclera: Conjunctivae normal.  ?Cardiovascular:  ?   Rate and Rhythm: Normal rate and regular rhythm.  ?   Heart sounds: Normal heart sounds.  ?Pulmonary:  ?   Effort: Pulmonary effort is normal.  ?   Breath sounds: Normal breath sounds.  ?Abdominal:  ?   General: Bowel sounds are normal.  ?   Palpations: Abdomen is soft.  ?Musculoskeletal:  ?    Cervical back: Normal range of motion.  ?Lymphadenopathy:  ?   Cervical: No cervical adenopathy.  ?Skin: ?   General: Skin is warm and dry.  ?   Capillary Refill: Capillary refill takes 2 to 3 seconds.  ?Neurological:

## 2021-12-16 ENCOUNTER — Encounter: Payer: Self-pay | Admitting: Physical Therapy

## 2021-12-16 ENCOUNTER — Ambulatory Visit (INDEPENDENT_AMBULATORY_CARE_PROVIDER_SITE_OTHER): Payer: BC Managed Care – PPO

## 2021-12-16 ENCOUNTER — Ambulatory Visit: Payer: BC Managed Care – PPO | Admitting: Physical Therapy

## 2021-12-16 ENCOUNTER — Ambulatory Visit: Payer: BC Managed Care – PPO | Attending: Physician Assistant | Admitting: Physical Therapy

## 2021-12-16 DIAGNOSIS — M2550 Pain in unspecified joint: Secondary | ICD-10-CM | POA: Diagnosis not present

## 2021-12-16 DIAGNOSIS — M357 Hypermobility syndrome: Secondary | ICD-10-CM | POA: Diagnosis not present

## 2021-12-16 DIAGNOSIS — M25511 Pain in right shoulder: Secondary | ICD-10-CM | POA: Insufficient documentation

## 2021-12-16 DIAGNOSIS — M6281 Muscle weakness (generalized): Secondary | ICD-10-CM | POA: Insufficient documentation

## 2021-12-16 DIAGNOSIS — M25552 Pain in left hip: Secondary | ICD-10-CM | POA: Insufficient documentation

## 2021-12-16 DIAGNOSIS — M5417 Radiculopathy, lumbosacral region: Secondary | ICD-10-CM | POA: Diagnosis not present

## 2021-12-16 DIAGNOSIS — J309 Allergic rhinitis, unspecified: Secondary | ICD-10-CM

## 2021-12-16 DIAGNOSIS — G8929 Other chronic pain: Secondary | ICD-10-CM | POA: Insufficient documentation

## 2021-12-16 NOTE — Therapy (Signed)
?OUTPATIENT PHYSICAL THERAPY TREATMENT NOTE ? ? ?Patient Name: Robin [name] ?MRN: [id number] ?DOB:30-Jan-1971, 51 y.o., female ?Today's Date: 12/16/2021 ? ?PCP: Sandrea Hughs, NP ?REFERRING PROVIDER: Ofilia Neas, PA-C ? ? PT End of Session - 12/16/21 0851   ? ? Visit Number 7   ? Number of Visits 12   ? Date for PT Re-Evaluation 01/20/22   ? Authorization Type BCBS   ? PT Start Time 0848   ? PT Stop Time 0930   ? PT Time Calculation (min) 42 min   ? Activity Tolerance Patient tolerated treatment well   ? Behavior During Therapy [facility] for tasks assessed/performed   ? ?  ?  ? ?  ? ? ? ? ? ? ?Past Medical History:  ?Diagnosis Date  ? ADD (attention deficit disorder)   ? Allergy   ? Anemia   ? Anorexia nervosa with bulimia   ? Per [facility] New Patient Packet  ? Anxiety   ? Asthma   ? Carpal tunnel syndrome   ? Per [facility] New Patient Packet  ? Depression   ? Dry eyes   ? Per [facility] New Patient Packet  ? Dry mouth   ? Per [facility] New Patient Packet  ? Fibroids   ? Per [facility] New Patient Packet  ? History of hip x-ray 2021  ? Per [facility] New Patient Packet  ? History of Papanicolaou smear of cervix 2021  ? Per [facility] New Patient Packet, Dr.Pickens  ? Hyperlipidemia   ? Ileitis   ? Lupus ([place])   ? PONV (postoperative nausea and vomiting)   ? mild nausea   ? Raynaud disease   ? Retinal tear   ? Per [facility] New Patient Packet  ? Systemic lupus erythematosus ([place])   ? ?Past Surgical History:  ?Procedure Laterality Date  ? CARPAL TUNNEL RELEASE Right   ? 08/2019  ? CESAREAN SECTION  2003  ? CESAREAN SECTION  2005  ? Per [facility] New Patient Packet  ? COLONOSCOPY  2017  ? Dr.Jacobs  ? DENTAL SURGERY  2009  ? Fromed tooth and chronic infection  ? DILITATION & CURRETTAGE/HYSTROSCOPY WITH NOVASURE ABLATION N/A 06/03/2020  ? Procedure: DILATATION & CURETTAGE/HYSTEROSCOPY WITH NOVASURE ABLATION;  Surgeon: Aletha Halim, MD;  Location: [place];  Service: Gynecology;  Laterality: N/A;  ? ENDOMETRIAL BIOPSY  04/20/2020  ?    ? EYE SURGERY  Bilateral   ? cryoretinopexy  ? ?Patient Active Problem List  ? Diagnosis Date Noted  ? [place] 04/14/2021  ? 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 Seasonal and perennial allergic rhinitis 11/06/2018  ? Mild intermittent asthma without complication 40/98/1191  ? Flexural atopic dermatitis 11/06/2018  ? History of hyperlipidemia 11/08/2016  ? History of anxiety 11/08/2016  ? History of retinal tear 11/08/2016  ? History of Bilateral plantar fasciitis 08/10/2016  ? High risk medication use 07/04/2016  ? Lupus ([place]) 05/23/2016  ? 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 ADHD (attention deficit hyperactivity disorder) 12/22/2014  ? Raynaud's phenomenon 10/17/2013  ? Hyperlipidemia 09/28/2011  ? ? ?REFERRING DIAG: M24.9 (ICD-10-CM) - Hypermobility of joint M53.3,G89.29 (ICD-10-CM) - Chronic left SI joint pain  ? ?THERAPY DIAG:  ?Hypermobility syndrome ? ?Polyarthralgia ? ?Radiculopathy, lumbosacral region ? ?Pain in left hip ? ?Muscle weakness (generalized) ? ?Chronic right shoulder pain ? ?PERTINENT HISTORY: see snapshot  ? ?PRECAUTIONS: none ? ?SUBJECTIVE:  ?Had some back spasms and that was why I changed the appt last time.  ? ? PAIN:  ?  Are you having pain? Yes ?NPRS scale: 5/10 ?Pain location: hands and feet and knees  ?Pain orientation: Bilateral  ?PAIN TYPE: aching, tight , sore  ?Pain description: intermittent  ?Aggravating factors: maybe more workouts, PM it "clenches" in low back  ?Relieving factors: HEP ? ? ?OBJECTIVE:  ? *Unless otherwise noted, objective information collected previously* ? ?DIAGNOSTIC FINDINGS:  ?None recent in SIJ ?  ?PATIENT SURVEYS:  ?Modified Oswestry NT   ?  ?SCREENING FOR RED FLAGS: ?Bowel or bladder incontinence: No ?Spinal tumors: No ?Cauda equina syndrome: No ?Compression fracture: No ?Abdominal aneurysm: No ?  ?COGNITION: ?         Overall cognitive status: Within functional limits for tasks assessed              ?          ?SENSATION: ?         Light touch: Appears intact ?          Stereognosis: Appears intact ?         Hot/Cold: Appears intact ?         Proprioception: Appears intact ?  ?POSTURE:  ?Rounded shoulders , L shoulder more forward than Rt  ?Lt PSIS higher than Rt in prone  ?Lt ASIS higher in supine than Rt  ?  ?  ?PALPATION: ?Pain along L lumbo sacral spine , paraspinals from low thoracic to low lumbar.  Palpation on Rt increased L sided "pressure".  Pressure with palpation on lateral SI border.  ?  ?LUMBARAROM/PROM ?  ?A/PROM A/PROM  ?10/12/2021  ?Flexion Palms to floor   ?Extension Painful 25%  ?Right lateral flexion Pain R   ?Left lateral flexion Pain R   ?Right rotation WNL  ?Lt rotation WNL   ? (Blank rows = not tested) ?  ?LE AROM/PROM:     WNL throughout  ?  ?  ?LE MMT: ?  ?MMT Right ?10/12/2021 Left ?10/12/2021  ?Hip flexion      ?Hip extension 5/5 5/5  ?Hip abduction 5/5 5/5  ?Hip adduction      ?Hip internal rotation      ?Hip external rotation      ?Knee flexion 5/5 5/5  ?Knee extension 5/5 5/5  ?Ankle dorsiflexion      ?Ankle plantarflexion      ?Ankle inversion      ?Ankle eversion      ? (Blank rows = not tested) ?  ?LUMBAR SPECIAL TESTS:  ?Prone instability test: NT.  Pain with SIJ compression.   Pain on L with Rt SLS,  no pain with Lt SLS  ?  ?FUNCTIONAL TESTS:  ?Single leg stance limited on L with pain in L lateral lumbar  region.  ?  ?  ?GAIT: ?Comments: No issues  ?  ? ?[facility] Adult PT Treatment:                                                DATE: 12/16/21 ?Therapeutic Exercise: ?Elliptical 5 min ramp 10, resist. 5  ?TRX /Airex squats , double then single ?TRX Curtsy x 10 to knee lift ?TRX hip hinge single leg used foam pad under pelvis  ?TRX step downs 6 inch x 10  each  ?Sideplank black band clam x 15-20 reps  ?S/L hip abd/extension black band x 15  ? ?Manual Therapy: ?Kinesiotape "X"  lumbar and then "Y" from deltoid up and 1 "I "strip transversely ?  ?[facility] Adult PT Treatment:                                                DATE: 12/09/2021 ?Therapeutic  Exercise: ?Controlled single leg squat descent to table 2x10 BIL ?Squat false hops onto toes with waist attachment and two 10# cables at [employer] machine 3x10 ?Cross body knee drives in SLS with thigh attachment and 3# cable at [employer] machine x10 BIL ?Standing windmill stretch x10 BIL ?Manual Therapy: ?Skilled palpation to identify trigger points prior to TPDN ?Effleurage to Lt lumbar paraspinals following TPDN ?Sidelying grade 4 lumbar roll mobilization throughout lumbar spine x1 BIL ?Neuromuscular re-ed: ?N/A ?Therapeutic Activity: ?N/A ?Modalities: ?N/A ?Self Care: ?N/A ? ? ? Trigger Point Dry-Needling  ?Treatment instructions: Expect mild to moderate muscle soreness. S/S of pneumothorax if dry needled over a lung field, and to seek immediate medical attention should they occur. Patient verbalized understanding of these instructions and education. ? ?Patient Consent Given: Yes ?Education handout provided: Yes ?Muscles treated: Lt lumbar multifidi L2-L3 in prone, Lt quadratus lumborum in sidelying ?Electrical stimulation performed: No ?Parameters: N/A ?Treatment response/outcome: two .30x50 needles placed into L2-L3 lumbar multifidi and left in situ x3 minutes after brief pistoning, followed by sidelying .75x50 needle places in Lt quadratus lumborum. Both result in improved muscle extensibility. No adverse effects noted.  ? ? ?[facility] Adult PT Treatment:                                                DATE: 11/24/21 ?Therapeutic Exercise: ?Bird dog x 10 , 5 sec hold  ?Primal plank 15 sec x 5 ?Slider lunge (glutes) with towel x 10, cues for level pelvis ?Warrior 3   hip hinge x 10  ?Isometric hip abd , glute med into the wall  ?Sit to stand single leg x 10 with 1 inch pad under hips.   ?Done again with 10 lbs wgt x 10 each side ?Lateral band walking with blue band and then without, tried with band around feet as an alternative ?Shoulder horizontal pull, ER and then behind back for scapular work (demo  mostly) ?Manual Therapy: ?Kinesiotape to Rt shoulder for support , 3 Y tapes ?Infraspinatus, deltoid and pec minor  ?Self Care: ?Fine tune HEP, hinge vs squat, taping, POC  ? ? ? ?  ?  ?PATIENT EDUCATION:  ?Education details: PT/POC, SI

## 2021-12-23 ENCOUNTER — Ambulatory Visit (INDEPENDENT_AMBULATORY_CARE_PROVIDER_SITE_OTHER): Payer: BC Managed Care – PPO

## 2021-12-23 ENCOUNTER — Encounter: Payer: Self-pay | Admitting: Rheumatology

## 2021-12-23 ENCOUNTER — Ambulatory Visit: Payer: BC Managed Care – PPO | Admitting: Rheumatology

## 2021-12-23 VITALS — BP 117/75 | HR 73 | Ht 66.0 in | Wt 159.4 lb

## 2021-12-23 DIAGNOSIS — M25511 Pain in right shoulder: Secondary | ICD-10-CM

## 2021-12-23 DIAGNOSIS — Z8639 Personal history of other endocrine, nutritional and metabolic disease: Secondary | ICD-10-CM

## 2021-12-23 DIAGNOSIS — J309 Allergic rhinitis, unspecified: Secondary | ICD-10-CM

## 2021-12-23 DIAGNOSIS — Z79899 Other long term (current) drug therapy: Secondary | ICD-10-CM | POA: Diagnosis not present

## 2021-12-23 DIAGNOSIS — I7300 Raynaud's syndrome without gangrene: Secondary | ICD-10-CM

## 2021-12-23 DIAGNOSIS — M19072 Primary osteoarthritis, left ankle and foot: Secondary | ICD-10-CM

## 2021-12-23 DIAGNOSIS — M249 Joint derangement, unspecified: Secondary | ICD-10-CM

## 2021-12-23 DIAGNOSIS — Z9889 Other specified postprocedural states: Secondary | ICD-10-CM

## 2021-12-23 DIAGNOSIS — M19071 Primary osteoarthritis, right ankle and foot: Secondary | ICD-10-CM

## 2021-12-23 DIAGNOSIS — L52 Erythema nodosum: Secondary | ICD-10-CM | POA: Diagnosis not present

## 2021-12-23 DIAGNOSIS — M533 Sacrococcygeal disorders, not elsewhere classified: Secondary | ICD-10-CM

## 2021-12-23 DIAGNOSIS — Z8659 Personal history of other mental and behavioral disorders: Secondary | ICD-10-CM

## 2021-12-23 DIAGNOSIS — M3219 Other organ or system involvement in systemic lupus erythematosus: Secondary | ICD-10-CM | POA: Diagnosis not present

## 2021-12-23 DIAGNOSIS — M19041 Primary osteoarthritis, right hand: Secondary | ICD-10-CM

## 2021-12-23 DIAGNOSIS — M19042 Primary osteoarthritis, left hand: Secondary | ICD-10-CM

## 2021-12-23 DIAGNOSIS — R5383 Other fatigue: Secondary | ICD-10-CM

## 2021-12-23 DIAGNOSIS — Z8669 Personal history of other diseases of the nervous system and sense organs: Secondary | ICD-10-CM

## 2021-12-23 DIAGNOSIS — R202 Paresthesia of skin: Secondary | ICD-10-CM

## 2021-12-23 DIAGNOSIS — E559 Vitamin D deficiency, unspecified: Secondary | ICD-10-CM

## 2021-12-23 DIAGNOSIS — F900 Attention-deficit hyperactivity disorder, predominantly inattentive type: Secondary | ICD-10-CM

## 2021-12-23 DIAGNOSIS — K5000 Crohn's disease of small intestine without complications: Secondary | ICD-10-CM

## 2021-12-23 DIAGNOSIS — G8929 Other chronic pain: Secondary | ICD-10-CM

## 2021-12-23 NOTE — Patient Instructions (Signed)
Standing Labs ?We placed an order today for your standing lab work.  ? ?Please have your standing labs drawn in June ? ?If possible, please have your labs drawn 2 weeks prior to your appointment so that the provider can discuss your results at your appointment. ? ?Please note that you may see your imaging and lab results in [place] before we have reviewed them. ?We may be awaiting multiple results to interpret others before contacting you. ?Please allow our office up to 72 hours to thoroughly review all of the results before contacting the office for clarification of your results. ? ?We have open lab daily: ?Monday through Thursday from 1:30-4:30 PM and Friday from 1:30-4:00 PM ?at the office of Dr. Bo Merino, [facility].   ?Please be advised, all patients with office appointments requiring lab work will take precedent over walk-in lab work.  ?If possible, please come for your lab work on Monday and Friday afternoons, as you may experience shorter wait times. ?The office is located at [address], [place], [place], [place] [address] ?No appointment is necessary.   ?Labs are drawn by [facility]. Please bring your co-pay at the time of your lab draw.  You may receive a bill from [facility] for your lab work. ? ?Please note if you are on Hydroxychloroquine and and an order has been placed for a Hydroxychloroquine level, you will need to have it drawn 4 hours or more after your last dose. ? ?If you wish to have your labs drawn at another location, please call the office 24 hours in advance to send orders. ? ?If you have any questions regarding directions or hours of operation,  ?please call [phone].   ?As a reminder, please drink plenty of water prior to coming for your lab work. Thanks!  ?

## 2021-12-28 ENCOUNTER — Ambulatory Visit: Payer: BC Managed Care – PPO | Admitting: Physical Therapy

## 2021-12-28 DIAGNOSIS — G8929 Other chronic pain: Secondary | ICD-10-CM

## 2021-12-28 DIAGNOSIS — M25511 Pain in right shoulder: Secondary | ICD-10-CM | POA: Diagnosis not present

## 2021-12-28 DIAGNOSIS — M5417 Radiculopathy, lumbosacral region: Secondary | ICD-10-CM

## 2021-12-28 DIAGNOSIS — M357 Hypermobility syndrome: Secondary | ICD-10-CM

## 2021-12-28 DIAGNOSIS — M6281 Muscle weakness (generalized): Secondary | ICD-10-CM | POA: Diagnosis not present

## 2021-12-28 DIAGNOSIS — M25552 Pain in left hip: Secondary | ICD-10-CM

## 2021-12-28 DIAGNOSIS — M2550 Pain in unspecified joint: Secondary | ICD-10-CM

## 2021-12-28 NOTE — Therapy (Signed)
?OUTPATIENT PHYSICAL THERAPY TREATMENT NOTE ? ? ?Patient Name: Robin [name] ?MRN: [id number] ?DOB:1971/03/18, 51 y.o., female ?Today's Date: 12/28/2021 ? ?PCP: Ngetich, Nelda Bucks, NP ?REFERRING PROVIDER: Ngetich, Nelda Bucks, NP ? ? PT End of Session - 12/28/21 0907   ? ? Visit Number 8   ? Number of Visits 12   ? Date for PT Re-Evaluation 01/20/22   ? Authorization Type BCBS   ? PT Start Time [phone]   ? PT Stop Time 6237   ? PT Time Calculation (min) 40 min   ? Activity Tolerance Patient tolerated treatment well   ? Behavior During Therapy [facility] for tasks assessed/performed   ? ?  ?  ? ?  ? ? ? ? ? ? ? ?Past Medical History:  ?Diagnosis Date  ? ADD (attention deficit disorder)   ? Allergy   ? Anemia   ? Anorexia nervosa with bulimia   ? Per [facility] New Patient Packet  ? Anxiety   ? Asthma   ? Carpal tunnel syndrome   ? Per [facility] New Patient Packet  ? Depression   ? Dry eyes   ? Per [facility] New Patient Packet  ? Dry mouth   ? Per [facility] New Patient Packet  ? Fibroids   ? Per [facility] New Patient Packet  ? History of hip x-ray 2021  ? Per [facility] New Patient Packet  ? History of Papanicolaou smear of cervix 2021  ? Per [facility] New Patient Packet, Dr.Pickens  ? Hyperlipidemia   ? Ileitis   ? Lupus ([place])   ? PONV (postoperative nausea and vomiting)   ? mild nausea   ? Raynaud disease   ? Retinal tear   ? Per [facility] New Patient Packet  ? Systemic lupus erythematosus ([place])   ? ?Past Surgical History:  ?Procedure Laterality Date  ? CARPAL TUNNEL RELEASE Right   ? 08/2019  ? CESAREAN SECTION  2003  ? CESAREAN SECTION  2005  ? Per [facility] New Patient Packet  ? COLONOSCOPY  2017  ? Dr.Jacobs  ? DENTAL SURGERY  2009  ? Fromed tooth and chronic infection  ? DILITATION & CURRETTAGE/HYSTROSCOPY WITH NOVASURE ABLATION N/A 06/03/2020  ? Procedure: DILATATION & CURETTAGE/HYSTEROSCOPY WITH NOVASURE ABLATION;  Surgeon: Aletha Halim, MD;  Location: [place];  Service: Gynecology;  Laterality: N/A;  ? ENDOMETRIAL BIOPSY  04/20/2020  ?    ? EYE SURGERY  Bilateral   ? cryoretinopexy  ? ?Patient Active Problem List  ? Diagnosis Date Noted  ? Flushing 04/14/2021  ? 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 Seasonal and perennial allergic rhinitis 11/06/2018  ? Mild intermittent asthma without complication 62/83/1517  ? Flexural atopic dermatitis 11/06/2018  ? History of hyperlipidemia 11/08/2016  ? History of anxiety 11/08/2016  ? History of retinal tear 11/08/2016  ? History of Bilateral plantar fasciitis 08/10/2016  ? High risk medication use 07/04/2016  ? Lupus ([place]) 05/23/2016  ? 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 ADHD (attention deficit hyperactivity disorder) 12/22/2014  ? Raynaud's phenomenon 10/17/2013  ? Hyperlipidemia 09/28/2011  ? ? ?REFERRING DIAG: M24.9 (ICD-10-CM) - Hypermobility of joint M53.3,G89.29 (ICD-10-CM) - Chronic left SI joint pain  ? ?THERAPY DIAG:  ?Hypermobility syndrome ? ?Polyarthralgia ? ?Radiculopathy, lumbosacral region ? ?Pain in left hip ? ?Muscle weakness (generalized) ? ?Chronic right shoulder pain ? ?PERTINENT HISTORY: see snapshot  ? ?PRECAUTIONS: none ? ?SUBJECTIVE:  ?I actually did some yardwork this weekend and my back felt good, but the other day I had  such bad dizziness and HR issues after.  Starting a new elimination diet today, hopeful it will help her varied symptoms.  Some SIJ pain and back pain today, minimal.  Hard to sit and be a passenger in the car.  Always feels worse when out of town and returning [place])   ? ? PAIN:  ?Are you having pain? Yes ?NPRS scale: 3/10- 4/10 ?Pain location: SIJ  ?Pain orientation: Bilateral  ?PAIN TYPE: aching, tight , sore  ?Pain description: intermittent  ?Aggravating factors: maybe more workouts, PM it "clenches" in low back  ?Relieving factors: HEP ? ? ?OBJECTIVE:  ? *Unless otherwise noted, objective information collected previously* ? ?DIAGNOSTIC FINDINGS:  ?None recent in SIJ ?  ?PATIENT SURVEYS:  ?Modified Oswestry NT   ?  ?SCREENING FOR RED FLAGS: ?Bowel or bladder incontinence:  No ?Spinal tumors: No ?Cauda equina syndrome: No ?Compression fracture: No ?Abdominal aneurysm: No ?  ?COGNITION: ?         Overall cognitive status: Within functional limits for tasks assessed              ?          ?SENSATION: ?         Light touch: Appears intact ?         Stereognosis: Appears intact ?         Hot/Cold: Appears intact ?         Proprioception: Appears intact ?  ?POSTURE:  ?Rounded shoulders , L shoulder more forward than Rt  ?Lt PSIS higher than Rt in prone  ?Lt ASIS higher in supine than Rt  ?  ?  ?PALPATION: ?Pain along L lumbo sacral spine , paraspinals from low thoracic to low lumbar.  Palpation on Rt increased L sided "pressure".  Pressure with palpation on lateral SI border.  ?  ?LUMBARAROM/PROM ?  ?A/PROM A/PROM  ?10/12/2021  ?Flexion Palms to floor   ?Extension Painful 25%  ?Right lateral flexion Pain R   ?Left lateral flexion Pain R   ?Right rotation WNL  ?Lt rotation WNL   ? (Blank rows = not tested) ?  ?LE AROM/PROM:     WNL throughout  ?  ?  ?LE MMT: ?  ?MMT Right ?10/12/2021 Left ?10/12/2021  ?Hip flexion      ?Hip extension 5/5 5/5  ?Hip abduction 5/5 5/5  ?Hip adduction      ?Hip internal rotation      ?Hip external rotation      ?Knee flexion 5/5 5/5  ?Knee extension 5/5 5/5  ?Ankle dorsiflexion      ?Ankle plantarflexion      ?Ankle inversion      ?Ankle eversion      ? (Blank rows = not tested) ?  ?LUMBAR SPECIAL TESTS:  ?Prone instability test: NT.  Pain with SIJ compression.   Pain on L with Rt SLS,  no pain with Lt SLS  ?  ?FUNCTIONAL TESTS:  ?Single leg stance limited on L with pain in L lateral lumbar  region.  ?  ?  ?GAIT: ?Comments: No issues  ?  ?[facility] Adult PT Treatment:                                                DATE: 12/28/21 ?Therapeutic Exercise: ?Elliptical ramp 10 resistance 9 for 5 min  ?Black  band hip stabilization  ?Sit to stand, squat and then pulse x 30 sec each  ?Lateral band walking 5 x 15 feet ?Forward and backward walking x 1 min ?[place] for  LE/Core strength, postural strength, lumbopelvic disassociation and core control.   ?Supine Leg [place] single purple x 15 ?Double leg yellow arcs x 10  added hip External Rotation x 10  ?Squat x 15 and small ROM circles ? ? ? ?[facility] Adult PT Treatment:                                                DATE: 12/16/21 ?Therapeutic Exercise: ?Elliptical 5 min ramp 10, resist. 5  ?TRX /Airex squats , double then single ?TRX Curtsy x 10 to knee lift ?TRX hip hinge single leg used foam pad under pelvis  ?TRX step downs 6 inch x 10  each  ?Sideplank black band clam x 15-20 reps  ?S/L hip abd/extension black band x 15  ? ?Manual Therapy: ?Kinesiotape "X"  lumbar and then "Y" from deltoid up and 1 "I "strip transversely ?  ?[facility] Adult PT Treatment:                                                DATE: 12/09/2021 ?Therapeutic Exercise: ?Controlled single leg squat descent to table 2x10 BIL ?Squat false hops onto toes with waist attachment and two 10# cables at [employer] machine 3x10 ?Cross body knee drives in SLS with thigh attachment and 3# cable at [employer] machine x10 BIL ?Standing windmill stretch x10 BIL ?Manual Therapy: ?Skilled palpation to identify trigger points prior to TPDN ?Effleurage to Lt lumbar paraspinals following TPDN ?Sidelying grade 4 lumbar roll mobilization throughout lumbar spine x1 BIL ?Neuromuscular re-ed: ?N/A ?Therapeutic Activity: ?N/A ?Modalities: ?N/A ?Self Care: ?N/A ? ? ? Trigger Point Dry-Needling  ?Treatment instructions: Expect mild to moderate muscle soreness. S/S of pneumothorax if dry needled over a lung field, and to seek immediate medical attention should they occur. Patient verbalized understanding of these instructions and education. ? ?Patient Consent Given: Yes ?Education handout provided: Yes ?Muscles treated: Lt lumbar multifidi L2-L3 in prone, Lt quadratus lumborum in sidelying ?Electrical stimulation performed: No ?Parameters: N/A ?Treatment response/outcome: two .30x50 needles  placed into L2-L3 lumbar multifidi and left in situ x3 minutes after brief pistoning, followed by sidelying .75x50 needle places in Lt quadratus lumborum. Both result in improved muscle extensibility. No adverse e

## 2021-12-29 DIAGNOSIS — F331 Major depressive disorder, recurrent, moderate: Secondary | ICD-10-CM | POA: Diagnosis not present

## 2021-12-30 ENCOUNTER — Ambulatory Visit: Payer: BC Managed Care – PPO | Admitting: Physical Therapy

## 2022-01-03 DIAGNOSIS — F339 Major depressive disorder, recurrent, unspecified: Secondary | ICD-10-CM | POA: Diagnosis not present

## 2022-01-03 DIAGNOSIS — F902 Attention-deficit hyperactivity disorder, combined type: Secondary | ICD-10-CM | POA: Diagnosis not present

## 2022-01-03 DIAGNOSIS — F411 Generalized anxiety disorder: Secondary | ICD-10-CM | POA: Diagnosis not present

## 2022-01-11 ENCOUNTER — Ambulatory Visit: Payer: BC Managed Care – PPO | Admitting: Physical Therapy

## 2022-01-12 DIAGNOSIS — F331 Major depressive disorder, recurrent, moderate: Secondary | ICD-10-CM | POA: Diagnosis not present

## 2022-01-17 ENCOUNTER — Other Ambulatory Visit: Payer: Self-pay | Admitting: Family Medicine

## 2022-01-19 ENCOUNTER — Encounter: Payer: Self-pay | Admitting: Family Medicine

## 2022-01-19 DIAGNOSIS — F902 Attention-deficit hyperactivity disorder, combined type: Secondary | ICD-10-CM | POA: Diagnosis not present

## 2022-01-19 DIAGNOSIS — F411 Generalized anxiety disorder: Secondary | ICD-10-CM | POA: Diagnosis not present

## 2022-01-19 DIAGNOSIS — F339 Major depressive disorder, recurrent, unspecified: Secondary | ICD-10-CM | POA: Diagnosis not present

## 2022-01-20 NOTE — Telephone Encounter (Signed)
Please help this patient to schedule an appointment. I'm glad to see her, however, she may be better suited to see one of our MD's to discuss MCAS evaluation and treatments. Thank you

## 2022-01-24 ENCOUNTER — Ambulatory Visit: Payer: BC Managed Care – PPO | Admitting: Physical Therapy

## 2022-01-26 DIAGNOSIS — F331 Major depressive disorder, recurrent, moderate: Secondary | ICD-10-CM | POA: Diagnosis not present

## 2022-01-27 ENCOUNTER — Encounter: Payer: Self-pay | Admitting: Allergy & Immunology

## 2022-01-27 ENCOUNTER — Other Ambulatory Visit: Payer: Self-pay

## 2022-01-27 ENCOUNTER — Ambulatory Visit: Payer: BC Managed Care – PPO | Admitting: Allergy & Immunology

## 2022-01-27 VITALS — BP 110/76 | HR 77 | Temp 97.9°F | Resp 17 | Ht 65.0 in | Wt 157.0 lb

## 2022-01-27 DIAGNOSIS — D8940 Mast cell activation, unspecified: Secondary | ICD-10-CM

## 2022-01-27 MED ORDER — FAMOTIDINE 20 MG PO TABS: 20.0000 mg | ORAL_TABLET | Freq: Two times a day (BID) | ORAL | 5 refills | Status: DC

## 2022-01-27 MED ORDER — MONTELUKAST SODIUM 10 MG PO TABS: 10.0000 mg | ORAL_TABLET | Freq: Two times a day (BID) | ORAL | 5 refills | Status: DC

## 2022-01-27 NOTE — Patient Instructions (Addendum)
1. Mild intermittent asthma, uncomplicated - Lung testing deferred today.  - We will not make any medication changes today. - Continue with albuterol every 4 hours as needed.    2. Seasonal and perennial allergic rhinitis (grasses, indoor molds, outdoor molds, dust mites, cat and cockroach) - Continue with an over the counter antihistamine daily.  - Continue with Singulair daily as needed. - Continue with Astelin as needed.  - Continue with allergy shots at the same schedule.    3. Flexural atopic dermatitis - Continue with moisturizing twice daily as needed.  4. 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  - I can certainly consider doing additional medications. - Continue with the Singulair for now (I can send in twice daily). - Continue with the Pepcid.  - I do not know anything about low dose naltrexone, but I am open to it (I will look at what you send).   5. Return in about 2 months (around 03/29/2022).    Please inform us of any Emergency Department visits, hospitalizations, or changes in symptoms. Call us before going to the ED for breathing or allergy symptoms since we might be able to fit you in for a sick visit. Feel free to contact us anytime with any questions, problems, or concerns.  It was a pleasure to see you and your family again today!  Websites that have reliable patient information: 1. American Academy of Asthma, Allergy, and Immunology: www.aaaai.org 2. Food Allergy Research and Education (FARE): foodallergy.org 3. Mothers of Asthmatics: http://www.asthmacommunitynetwork.org 4. American College of Allergy, Asthma, and Immunology: www.acaai.org   COVID-19 Vaccine Information can be found at: ShippingScam.co.uk For  questions related to vaccine distribution or appointments, please email [email or web address] or call [phone].     "Like" [place] on Facebook and Instagram for our latest updates!       Make sure you are registered to vote! If you have moved or changed any of your contact information, you will need to get this updated before voting!  In some cases, you MAY be able to register to vote online: [email or web address]

## 2022-01-27 NOTE — Progress Notes (Signed)
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 Estanislado Pandy  Plan/Recommendations:   1. Mild intermittent asthma, uncomplicated - Lung testing deferred today.  - We will not make any medication changes today. - Continue with albuterol every 4 hours as needed.    2. Seasonal and perennial allergic rhinitis (grasses, indoor molds, outdoor molds, dust mites, cat and cockroach) - Continue with an over the counter antihistamine daily.  - Continue with Singulair daily as needed. - Continue with Astelin as needed.  - Continue with allergy shots at the same schedule.    3. Flexural atopic dermatitis - Continue with moisturizing twice daily as needed.  4. 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  - I can certainly consider doing additional medications. - Continue with the Singulair for now (I can send in twice daily). - Continue with the Pepcid.  - I do not know anything about low dose naltrexone, but I am open to it (I will look at what you send).   5. Return in about 2 months (around 03/29/2022).    Subjective:   Robin [name] is a 51 y.o. female presenting today for follow up of  Chief Complaint  Patient presents with   GI Problem    HONEST SAFRANEK has a history of the following: Patient Active Problem List   Diagnosis Date Noted   Flushing 04/14/2021   Hypermobility syndrome 11/27/2020    Seasonal and perennial allergic rhinitis 11/06/2018   Mild intermittent asthma without complication 89/37/3428   Flexural atopic dermatitis 11/06/2018   History of hyperlipidemia 11/08/2016   History of anxiety 11/08/2016   History of retinal tear 11/08/2016   History of Bilateral plantar fasciitis 08/10/2016   High risk medication use 07/04/2016   Lupus ([place]) 05/23/2016   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  Robin [name] is a 51 y.o. female presenting for  to discuss concerns about possible MCAS .  She was last seen in August 2022 by Gareth Morgan, one of our nurse practitioners.  At that time, she was continued on albuterol every 4 hours as needed.  For her allergic rhinitis, we stopped Allegra and started Claritin daily.  She recommended alternating antihistamines.  She also continue with Astelin and nasal saline.  Her allergy shots are going well.  Atopic dermatitis was controlled with moisturizing.  She was flushing and a serum tryptase was sent. This was normal.    Since the last visit, she has had a "journey". She was diagnosed with hypermobility syndrome. This is symptomatic joint hypermobility syndrome. This was the previous Ehlers-Danlos. She finally saw Dr. Everlene Balls. She has been having more allergic reactions and whatnot. Lupus is stable (she sees Dr. Estanislado Pandy). She has been in physical therapy.   She did a consultation with Dr. Domenick Gong, a specialist in hypermobility syndrome.  She is an anesthestiologist specifically with pain management for patients. She did a consultation with her. It was recommended that she look into MCAS because of how many systems are involved (GI systems, allergic symptoms, etc). She is working with Jacquiline Doe, a nutritionist based in [place]. She is working on decreasing the histamine  in her diet. She has noticed a reduction in her symptoms. She has been on this for nearly one month now and her symptoms are much better. She has had some flares, but her baseline daily pain has reduced.   When she was on the most restrictive diet (oatmeal, chicken, cabbage, and brown rice), she felt great. This was hewr basleine gfiet. She was doing well with this and she has been slowly increasing foods. She is going to introduce more things  and she is going to be introducing some supplements.  She is interested in doing low dose naltrexone. She is thinking of doing Singulair BID. She has started [facility] and this seems to be helping a lot.   Vilinda Boehringer is graduating soon from the early college. Vilinda Boehringer is going to [employer]. Jennika's other child is also trans. This has been a difficult time for her and her family. They are considering moving in order to stay safe.   Otherwise, there have been no changes to her past medical history, surgical history, family history, or social history.    Review of Systems  Constitutional: Negative.  Negative for chills, fever, malaise/fatigue and weight loss.  HENT: Negative.  Negative for congestion, ear discharge and ear pain.   Eyes:  Negative for pain, discharge and redness.  Respiratory:  Negative for cough, sputum production, shortness of breath and wheezing.   Cardiovascular: Negative.  Negative for chest pain and palpitations.  Gastrointestinal:  Positive for nausea. Negative for abdominal pain, constipation, diarrhea, heartburn and vomiting.  Skin: Negative.  Negative for itching and rash.  Neurological:  Negative for dizziness and headaches.  Endo/Heme/Allergies:  Negative for environmental allergies. Does not bruise/bleed easily.      Objective:   Blood pressure 110/76, pulse 77, temperature 97.9 F (36.6 C), temperature source Temporal, resp. rate 17, height 5' 5"  (1.651 m), weight 157 lb (71.2 kg), SpO2 97 %. Body mass index is 26.13  kg/m.    Physical Exam Vitals reviewed.  Constitutional:      Appearance: She is well-developed.     Comments: Very friendly.  HENT:     Head: Normocephalic and atraumatic.     Right Ear: Tympanic membrane, ear canal and external ear normal.     Left Ear: Tympanic membrane, ear canal and external ear normal.     Nose: No nasal deformity, septal deviation, mucosal edema or rhinorrhea.     Right Turbinates: Enlarged, swollen and pale.     Left Turbinates: Enlarged, swollen and pale.     Right Sinus: No maxillary sinus tenderness or frontal sinus tenderness.     Left Sinus: No maxillary sinus tenderness or frontal sinus tenderness.     Mouth/Throat:     Lips: Pink.     Mouth: Mucous membranes are moist. Mucous membranes are not pale and not dry.     Pharynx: Uvula midline.  Eyes:     General: Allergic shiner present.        Right eye: No discharge.        Left eye: No discharge.     Conjunctiva/sclera: Conjunctivae normal.     Right eye: Right conjunctiva is not injected.  No chemosis.    Left eye: Left conjunctiva is not injected. No chemosis.    Pupils: Pupils are equal, round, and reactive to light.  Cardiovascular:     Rate and Rhythm: Normal rate and regular rhythm.     Heart sounds: Normal heart sounds.  Pulmonary:     Effort: Pulmonary effort is normal. No tachypnea, accessory muscle usage or respiratory distress.     Breath sounds: Normal breath sounds. No wheezing, rhonchi or rales.     Comments: Moving air well in all lung fields. Chest:     Chest wall: No tenderness.  Lymphadenopathy:     Cervical: No cervical adenopathy.  Skin:    Coloration: Skin is not pale.     Findings: No abrasion, erythema, petechiae or rash. Rash is not papular, urticarial or vesicular.  Neurological:     Mental Status: She is alert.  Psychiatric:        Behavior: Behavior is cooperative.     Diagnostic studies: none      Salvatore Marvel, MD  [facility] [place]

## 2022-02-01 ENCOUNTER — Encounter: Payer: Self-pay | Admitting: Allergy & Immunology

## 2022-02-02 DIAGNOSIS — D8940 Mast cell activation, unspecified: Secondary | ICD-10-CM | POA: Diagnosis not present

## 2022-02-03 ENCOUNTER — Other Ambulatory Visit: Payer: Self-pay | Admitting: Physician Assistant

## 2022-02-03 ENCOUNTER — Ambulatory Visit: Payer: BC Managed Care – PPO | Admitting: Physical Therapy

## 2022-02-03 DIAGNOSIS — M3219 Other organ or system involvement in systemic lupus erythematosus: Secondary | ICD-10-CM

## 2022-02-03 NOTE — Telephone Encounter (Signed)
Next Visit: 05/26/2022  Last Visit: 12/23/2021  Labs: 09/24/2021 Alk phos is low-stable. Rest of CMP WNL.  CBC WNL  Eye exam: 06/03/2021 WNL   Current Dose per office note 12/23/2021: Plaquenil 200 mg 1 tablet by mouth twice daily.   DX: Other systemic lupus erythematosus with other organ involvement   Last Fill: 11/01/2021  Okay to refill Plaquenil?

## 2022-02-08 LAB — N-METHYLHISTAMINE, 24 HR, U
Collection Duration: 24 h
Creatinine Concent. 24 Hr, U: 57 mg/dL
Creatinine, 24 Hour, U: 1482 mg/24 h (ref 603–1783)
N-Methylhistamine, 24 Hr, U: 99 mcg/g Cr (ref 30–200)
Urine Volume: 2600 mL

## 2022-02-09 DIAGNOSIS — F331 Major depressive disorder, recurrent, moderate: Secondary | ICD-10-CM | POA: Diagnosis not present

## 2022-02-16 ENCOUNTER — Encounter: Payer: Self-pay | Admitting: Allergy & Immunology

## 2022-02-17 MED ORDER — MONTELUKAST SODIUM 10 MG PO TABS: 10.0000 mg | ORAL_TABLET | Freq: Two times a day (BID) | ORAL | 5 refills | Status: DC

## 2022-02-18 ENCOUNTER — Other Ambulatory Visit: Payer: Self-pay

## 2022-02-18 DIAGNOSIS — F411 Generalized anxiety disorder: Secondary | ICD-10-CM | POA: Diagnosis not present

## 2022-02-18 DIAGNOSIS — F902 Attention-deficit hyperactivity disorder, combined type: Secondary | ICD-10-CM | POA: Diagnosis not present

## 2022-02-18 DIAGNOSIS — F339 Major depressive disorder, recurrent, unspecified: Secondary | ICD-10-CM | POA: Diagnosis not present

## 2022-02-18 MED ORDER — DICYCLOMINE HCL 10 MG PO CAPS: 10.0000 mg | ORAL_CAPSULE | Freq: Two times a day (BID) | ORAL | 1 refills | Status: DC | PRN

## 2022-02-18 NOTE — Telephone Encounter (Signed)
Dicyclomine 55m [facility] up to date on visits. Uses prn.

## 2022-02-21 LAB — C-KIT MUTATION, LIQUID TUMOR

## 2022-02-21 LAB — PROSTAGLANDIN D2/CREATININE, U
Creatinine, Urine: 18 mg/dL
Prostaglandin D2, urine: 2.9 pg/mL
Prostaglandin D2/Cr Ratio: 16 ng/g

## 2022-02-21 LAB — TRYPTASE: Tryptase: 12 ug/L (ref 2.2–13.2)

## 2022-02-23 DIAGNOSIS — F331 Major depressive disorder, recurrent, moderate: Secondary | ICD-10-CM | POA: Diagnosis not present

## 2022-02-28 NOTE — Therapy (Unsigned)
OUTPATIENT PHYSICAL THERAPY TREATMENT NOTE RENEWAL   Patient Name: Robin [name] MRN: [id number] DOB:[birth year], 51 y.o., female Today's Date: 03/01/2022  PCP: Sandrea Hughs, NP REFERRING PROVIDER: Ofilia Neas, PA-C   PT End of Session - 03/01/22 1236     Visit Number 9    Number of Visits 15    Date for PT Re-Evaluation 04/12/22    Authorization Type BCBS    PT Start Time 1235    PT Stop Time 1315    PT Time Calculation (min) 40 min    Activity Tolerance Patient tolerated treatment well    Behavior During Therapy WFL for tasks assessed/performed                   Past Medical History:  Diagnosis Date   ADD (attention deficit disorder)    Allergy    Anemia    Anorexia nervosa with bulimia    Per [place] [facility]   Anxiety    Asthma    Carpal tunnel syndrome    Per [facility]   Depression    Dry eyes    Per [place] [facility]   Dry mouth    Per [place] [facility]   Fibroids    Per [facility] Packet   History of hip x-ray 2021   Per [place] [facility]   History of Papanicolaou smear of cervix 2021   Per [place] [facility] Packet, Dr.Pickens   Hyperlipidemia    Ileitis    Lupus ([place])    PONV (postoperative nausea and vomiting)    mild nausea    Raynaud disease    Retinal tear    Per [facility] Patient Packet   Systemic lupus erythematosus ([place])    Past Surgical History:  Procedure Laterality Date   CARPAL TUNNEL RELEASE Right    08/2019   CESAREAN SECTION  2003   CESAREAN SECTION  2005   Per [facility] Patient Packet   COLONOSCOPY  2017   Dr.Jacobs   DENTAL SURGERY  2009   Fromed tooth and chronic infection   DILITATION & CURRETTAGE/HYSTROSCOPY WITH NOVASURE ABLATION N/A 06/03/2020   Procedure: DILATATION & CURETTAGE/HYSTEROSCOPY WITH NOVASURE ABLATION;  Surgeon: Aletha Halim, MD;  Location: [facility];  Service: Gynecology;  Laterality: N/A;   ENDOMETRIAL BIOPSY  04/20/2020       EYE  SURGERY Bilateral    cryoretinopexy   Patient Active Problem List   Diagnosis Date Noted   Flushing 04/14/2021   Hypermobility syndrome 11/27/2020   Seasonal and perennial allergic rhinitis 11/06/2018   Mild intermittent asthma without complication 91/50/5697   Flexural atopic dermatitis 11/06/2018   History of hyperlipidemia 11/08/2016   History of anxiety 11/08/2016   History of retinal tear 11/08/2016   History of Bilateral plantar fasciitis 08/10/2016   High risk medication use 07/04/2016   Lupus ([place]) 05/23/2016   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[place]) 03/18/2016   ADHD (attention deficit hyperactivity disorder) 12/22/2014   Raynaud's phenomenon 10/17/2013   Hyperlipidemia 09/28/2011    REFERRING DIAG: M24.9 (ICD-10-CM) - Hypermobility of joint M53.3,G89.29 (ICD-10-CM) - Chronic left SI joint pain   THERAPY DIAG:  Hypermobility syndrome  Radiculopathy, lumbosacral region  Polyarthralgia  Pain in left hip  Muscle weakness (generalized)  Chronic right shoulder pain  PERTINENT HISTORY: see snapshot   PRECAUTIONS: none  SUBJECTIVE:    Patient returns for Re-Eval.  She has been on a elimination diet and that  has helped my pain a lot! Has noted that the heat makes it worse. 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  Has some tightness in L hamstring as she was walking on the beach. Carpal tunnel has been flared up. Close to [employer].  Wants to get stronger now that her pain is better.     PAIN:  Are you having pain? Yes NPRS scale: 4/10 Pain location: SIJ , L hamstring 4//10 and feet 3/10.  Pain orientation: Bilateral  PAIN TYPE: aching, tight , sore  Pain description: intermittent  Aggravating factors: maybe more workouts, PM it "clenches" in low back  Relieving factors: HEP   OBJECTIVE:   *Unless otherwise noted, objective information collected previously*  DIAGNOSTIC FINDINGS:  None recent in SIJ   PATIENT SURVEYS:   Modified Oswestry NT     SCREENING FOR RED FLAGS: Bowel or bladder incontinence: No Spinal tumors: No Cauda equina syndrome: No Compression fracture: No Abdominal aneurysm: No   COGNITION:          Overall cognitive status: Within functional limits for tasks assessed                        SENSATION:          Light touch: Appears intact          Stereognosis: Appears intact          Hot/Cold: Appears intact          Proprioception: Appears intact   POSTURE:  Rounded shoulders , L shoulder more forward than Rt  Lt PSIS higher than Rt in prone  Lt ASIS higher in supine than Rt      PALPATION: Pain along L lumbo sacral spine , paraspinals from low thoracic to low lumbar.  Palpation on Rt increased L sided "pressure".  Pressure with palpation on lateral SI border.    LUMBARAROM/PROM   A/PROM A/PROM  10/12/2021  Flexion Palms to floor   Extension Painful 25%  Right lateral flexion Pain R   Left lateral flexion Pain R   Right rotation WNL  Lt rotation WNL    (Blank rows = not tested)   LE AROM/PROM:     WNL throughout      LE MMT:   MMT Right 10/12/2021 Left 10/12/2021  Hip flexion      Hip extension 5/5 5/5  Hip abduction 5/5 5/5  Hip adduction      Hip internal rotation      Hip external rotation      Knee flexion 5/5 5/5  Knee extension 5/5 5/5  Ankle dorsiflexion      Ankle plantarflexion      Ankle inversion      Ankle eversion       (Blank rows = not tested)   LUMBAR SPECIAL TESTS:  Prone instability test: NT.  Pain with SIJ compression.   Pain on L with Rt SLS,  no pain with Lt SLS    FUNCTIONAL TESTS:  Single leg stance limited on L with pain in L lateral lumbar  region.      GAIT: Comments: No issues     [facility] Adult PT Treatment:  DATE: 03/01/22 Therapeutic Exercise: Warm up for hips, ankles and knees Squat x 10 , Rt knee pain  Wall sit 1 min with toe press for quad Dead lift used 10 lbs DB for  form , multiple reps with min cues  Knee extension 25 lbs x 10, 3 plates , can do single leg Rt 15 lbs and Lt. LE 20 lbs Reverse step up done with UE support and then holding 10 lbs KB  Self Care:   Strength vs stability and building strength not that  Discussed POC, HEP and loading tissue to tolerance for muscle hypertrophy    [facility] Adult PT Treatment:                                                DATE: 12/28/21 Therapeutic Exercise: Elliptical ramp 10 resistance 9 for 5 min  Black band hip stabilization  Sit to stand, squat and then pulse x 30 sec each  Lateral band walking 5 x 15 feet Forward and backward walking x 1 min Pilates Tower for LE/Core strength, postural strength, lumbopelvic disassociation and core control.   Supine Leg [place] single purple x 15 Double leg yellow arcs x 10  added hip External Rotation x 10  Squat x 15 and small ROM circles    [facility] Adult PT Treatment:                                                DATE: 12/16/21 Therapeutic Exercise: Elliptical 5 min ramp 10, resist. 5  TRX /Airex squats , double then single TRX Curtsy x 10 to knee lift TRX hip hinge single leg used foam pad under pelvis  TRX step downs 6 inch x 10  each  Sideplank black band clam x 15-20 reps  S/L hip abd/extension black band x 15   Manual Therapy: Kinesiotape "X"  lumbar and then "Y" from deltoid up and 1 "I "strip transversely   [facility] Adult PT Treatment:                                                DATE: 12/09/2021 Therapeutic Exercise: Controlled single leg squat descent to table 2x10 BIL Squat false hops onto toes with waist attachment and two 10# cables at [employer] machine 3x10 Cross body knee drives in SLS with thigh attachment and 3# cable at [employer] machine x10 BIL Standing windmill stretch x10 BIL Manual Therapy: Skilled palpation to identify trigger points prior to TPDN Effleurage to Lt lumbar paraspinals following TPDN Sidelying grade 4 lumbar roll  mobilization throughout lumbar spine x1 BIL Neuromuscular re-ed: N/A Therapeutic Activity: N/A Modalities: N/A Self Care: N/A    Trigger Point Dry-Needling  Treatment instructions: Expect mild to moderate muscle soreness. S/S of pneumothorax if dry needled over a lung field, and to seek immediate medical attention should they occur. Patient verbalized understanding of these instructions and education.  Patient Consent Given: Yes Education handout provided: Yes Muscles treated: Lt lumbar multifidi L2-L3 in prone, Lt quadratus lumborum in sidelying Electrical stimulation performed: No Parameters: N/A Treatment response/outcome: two .30x50 needles placed into  L2-L3 lumbar multifidi and left in situ x3 minutes after brief pistoning, followed by sidelying .75x50 needle places in Lt quadratus lumborum. Both result in improved muscle extensibility. No adverse effects noted.      PATIENT EDUCATION:  Education details: PT/POC, SIJ , joint instability  Person educated: Patient Education method: Explanation Education comprehension: verbalized understanding, returned demonstration, and needs further education     HOME EXERCISE PROGRAM: [id number]  Wall sit Lateral band walk Dead lift Knee extension Reverse step up/wall glide lunge    ASSESSMENT:   CLINICAL IMPRESSION:   Patient overall doing well, able to identify what triggers her pain and dysautonomia symptoms.  She was renewed today for 6 more visits to focus on loading and strengthening.  Her pain is well controlled and she can manage this.  She wants to be able to run and be more fit in general without increasing pain.  She intends to go to [employer] and run with her dog.   Rt knee feels unstable to her with loaded knee flexion.   REHAB POTENTIAL: Excellent   CLINICAL DECISION MAKING: Stable/uncomplicated   EVALUATION COMPLEXITY: Low     GOALS: Goals reviewed with patient? Yes       LONG TERM GOALS:    LTG Name  Target Date Goal status  1 Pt will be I with HEP for more advanced lumbopelvic , hip exercises Baseline:  In progress  2 Pt will be able to report centralization of pain > 75% of the time with ADLs, walking  Baseline:  met  3 Pt will be able to stand on each LE for dynamic balance and stability exercises for 30 sec  Baseline: met for static balance   Ongoing   4 Pt will be able to hike, walk for 1 hour with no increased pain in lumbar spine Baseline:  Ongoing   5 Pt will be able to realize proper spinal alignment with standing and self correct Baseline:  Ongoing   6.  Pt will be able to lift, squat without increased pain    NEW  7.   Pt will be able to build endurance, able to run 1 mile without stopping    NEW    PLAN: PT FREQUENCY: 1 x per week  PT DURATION: 6 weeks   PLANNED INTERVENTIONS: Therapeutic exercises, Therapeutic activity, Neuro Muscular re-education, Balance training, Gait training, Patient/Family education, Joint mobilization, Dry Needling, Cryotherapy, Moist heat, Taping, Ionotophoresis 12m/ml Dexamethasone, and Manual therapy, Pilates   PLAN FOR NEXT SESSION:  Lifting, progressive loading  Cont POC, pelvic stabilty, dynamic balance and core    JRaeford Razor PT 03/01/22 2:46 PM Phone: [phone]: [phone]

## 2022-03-01 ENCOUNTER — Ambulatory Visit: Payer: BC Managed Care – PPO | Attending: Physician Assistant | Admitting: Physical Therapy

## 2022-03-01 DIAGNOSIS — M357 Hypermobility syndrome: Secondary | ICD-10-CM | POA: Insufficient documentation

## 2022-03-01 DIAGNOSIS — M25552 Pain in left hip: Secondary | ICD-10-CM | POA: Diagnosis not present

## 2022-03-01 DIAGNOSIS — M2550 Pain in unspecified joint: Secondary | ICD-10-CM | POA: Insufficient documentation

## 2022-03-01 DIAGNOSIS — M25511 Pain in right shoulder: Secondary | ICD-10-CM | POA: Diagnosis not present

## 2022-03-01 DIAGNOSIS — M5417 Radiculopathy, lumbosacral region: Secondary | ICD-10-CM | POA: Insufficient documentation

## 2022-03-01 DIAGNOSIS — G8929 Other chronic pain: Secondary | ICD-10-CM | POA: Diagnosis not present

## 2022-03-01 DIAGNOSIS — M6281 Muscle weakness (generalized): Secondary | ICD-10-CM | POA: Insufficient documentation

## 2022-03-03 ENCOUNTER — Other Ambulatory Visit: Payer: Self-pay

## 2022-03-03 MED ORDER — DICYCLOMINE HCL 10 MG PO CAPS: 10.0000 mg | ORAL_CAPSULE | Freq: Two times a day (BID) | ORAL | 1 refills | Status: DC | PRN

## 2022-03-03 MED ORDER — AZELASTINE HCL 0.1 % NA SOLN: NASAL | 5 refills | Status: DC

## 2022-03-16 ENCOUNTER — Encounter: Payer: Self-pay | Admitting: Orthopaedic Surgery

## 2022-03-16 ENCOUNTER — Ambulatory Visit: Payer: BC Managed Care – PPO | Admitting: Orthopaedic Surgery

## 2022-03-16 DIAGNOSIS — G5601 Carpal tunnel syndrome, right upper limb: Secondary | ICD-10-CM

## 2022-03-16 DIAGNOSIS — G5603 Carpal tunnel syndrome, bilateral upper limbs: Secondary | ICD-10-CM

## 2022-03-16 DIAGNOSIS — F331 Major depressive disorder, recurrent, moderate: Secondary | ICD-10-CM | POA: Diagnosis not present

## 2022-03-16 DIAGNOSIS — G5602 Carpal tunnel syndrome, left upper limb: Secondary | ICD-10-CM | POA: Diagnosis not present

## 2022-03-16 MED ORDER — LIDOCAINE HCL 1 % IJ SOLN
1.0000 mL | INTRAMUSCULAR | Status: AC | PRN
  Administered 2022-03-16: 1 mL

## 2022-03-16 MED ORDER — BUPIVACAINE HCL 0.5 % IJ SOLN
1.0000 mL | INTRAMUSCULAR | Status: AC | PRN
  Administered 2022-03-16: 1 mL

## 2022-03-16 MED ORDER — METHYLPREDNISOLONE ACETATE 40 MG/ML IJ SUSP
40.0000 mg | INTRAMUSCULAR | Status: AC | PRN
  Administered 2022-03-16: 40 mg

## 2022-03-16 NOTE — Progress Notes (Addendum)
Office Visit Note   Patient: Robin [name]           Date of Birth: 03-01-1971           MRN: [id number] Visit Date: 03/16/2022              Requested by: Sandrea Hughs, NP [address] [place],  [place] [address] PCP: Sandrea Hughs, NP   Assessment & Plan: Visit Diagnoses:  1. Left carpal tunnel syndrome   2. Right carpal tunnel syndrome     Plan: Impression is worsening left carpal tunnel syndrome.  We will place her in a brace to wear at nighttime.  Patient requested cortisone injection today.  We will obtain nerve conduction studies to establish baseline.  She also would like to have a brace for the right hand as well.  She will follow-up as needed.  Possibly looking at left carpal tunnel release sometime in the fall.  Did very well with the right carpal tunnel release in December 2020.  Follow-Up Instructions: No follow-ups on file.   Orders:  Orders Placed This Encounter  Procedures   Hand/UE Inj   Ambulatory referral to Physical Medicine Rehab   No orders of the defined types were placed in this encounter.     Procedures: Hand/UE Inj: L carpal tunnel for carpal tunnel syndrome on 03/16/2022 1:23 PM Details: 25 G needle Medications: 1 mL lidocaine 1 %; 1 mL bupivacaine 0.5 %; 40 mg methylPREDNISolone acetate 40 MG/ML Outcome: tolerated well, no immediate complications Patient was prepped and draped in the usual sterile fashion.       Clinical Data: No additional findings.   Subjective: Chief Complaint  Patient presents with   Left Wrist - Pain, Numbness    HPI Robin [name] comes in today for numbness to the left hand and radiation of pain up into the arm and neck.  Interfering with her sleep.  Feels reminiscent of her symptoms in the right hand from a few years ago.  Review of Systems  Constitutional: Negative.   HENT: Negative.    Eyes: Negative.   Respiratory: Negative.    Cardiovascular: Negative.   Endocrine: Negative.   Musculoskeletal:  Negative.   Neurological: Negative.   Hematological: Negative.   Psychiatric/Behavioral: Negative.    All other systems reviewed and are negative.    Objective: Vital Signs: There were no vitals taken for this visit.  Physical Exam Vitals and nursing note reviewed.  Constitutional:      Appearance: She is well-developed.  Pulmonary:     Effort: Pulmonary effort is normal.  Skin:    General: Skin is warm.     Capillary Refill: Capillary refill takes less than 2 seconds.  Neurological:     Mental Status: She is alert and oriented to person, place, and time.  Psychiatric:        Behavior: Behavior normal.        Thought Content: Thought content normal.        Judgment: Judgment normal.     Ortho Exam Examination left hand shows positive carpal tunnel compressive signs.  No muscle atrophy.  No motor deficits.  Decreased sensation in the fingertips.  Specialty Comments:  No specialty comments available.  Imaging: No results found.   PMFS History: Patient Active Problem List   Diagnosis Date Noted   Right carpal tunnel syndrome 03/16/2022   Flushing 04/14/2021   Hypermobility syndrome 11/27/2020   Seasonal and perennial allergic rhinitis 11/06/2018  Mild intermittent asthma without complication 47/82/9562   Flexural atopic dermatitis 11/06/2018   History of hyperlipidemia 11/08/2016   History of anxiety 11/08/2016   History of retinal tear 11/08/2016   History of Bilateral plantar fasciitis 08/10/2016   High risk medication use 07/04/2016   Lupus ([place]) 05/23/2016   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[place]) 03/18/2016   ADHD (attention deficit hyperactivity disorder) 12/22/2014   Raynaud's phenomenon 10/17/2013   Hyperlipidemia 09/28/2011   Past Medical History:  Diagnosis Date   ADD (attention deficit disorder)    Allergy    Anemia    Anorexia nervosa with bulimia    Per [place] [facility] Patient Packet   Anxiety    Asthma    Carpal tunnel syndrome    Per [place] [facility] Packet    Depression    Dry eyes    Per [place] [facility] Patient Packet   Dry mouth    Per [place] [facility] Patient Packet   Fibroids    Per [place] [facility] Patient Packet   History of hip x-ray 2021   Per [place] [facility] Patient Packet   History of Papanicolaou smear of cervix 2021   Per [place] [facility] Patient Packet, Dr.Pickens   Hyperlipidemia    Ileitis    Lupus ([place])    PONV (postoperative nausea and vomiting)    mild nausea    Raynaud disease    Retinal tear    Per [place] [facility] Patient Packet   Systemic lupus erythematosus ([place])     Family History  Problem Relation Age of Onset   Heart disease Mother 21       CAD/stenting   Hyperlipidemia Mother    Arthritis Mother        ostearthritis   Hypertension Mother    Kidney disease Mother    Irritable bowel syndrome Mother    Stroke Mother 53       CVA   Fibromyalgia Mother    Eczema Mother    Sjogren's syndrome Mother    Hyperlipidemia Father    Neuropathy Father    Allergies Father        shellfish   Prostatitis Father    Hyperlipidemia Sister    ADD / ADHD Sister    Bipolar disorder Sister    Depression Sister    Hypertension Brother    OCD Brother    Anxiety disorder Daughter    ADD / ADHD Daughter    Asthma Daughter    OCD Son    Colon cancer Neg Hx    Inflammatory bowel disease Neg Hx    Allergic rhinitis Neg Hx    Angioedema Neg Hx    Atopy Neg Hx    Immunodeficiency Neg Hx    Urticaria Neg Hx     Past Surgical History:  Procedure Laterality Date   CARPAL TUNNEL RELEASE Right    08/2019   CESAREAN SECTION  2003   CESAREAN SECTION  2005   Per [facility]   COLONOSCOPY  2017   Dr.Jacobs   DENTAL SURGERY  2009   Fromed tooth and chronic infection   DILITATION & CURRETTAGE/HYSTROSCOPY WITH NOVASURE ABLATION N/A 06/03/2020   Procedure: DILATATION & CURETTAGE/HYSTEROSCOPY WITH NOVASURE ABLATION;  Surgeon: Aletha Halim, MD;  Location: [facility];  Service: Gynecology;  Laterality: N/A;   ENDOMETRIAL BIOPSY  04/20/2020        EYE SURGERY Bilateral    cryoretinopexy   Social History   Occupational History   Not on file  Tobacco  Use   Smoking status: Former    Packs/day: 0.75    Years: 10.00    Total pack years: 7.50    Types: Cigarettes    Quit date: 07/05/2001    Years since quitting: 20.7    Passive exposure: Never   Smokeless tobacco: Never  Vaping Use   Vaping Use: Never used  Substance and Sexual Activity   Alcohol use: Yes    Alcohol/week: 10.0 standard drinks of alcohol    Types: 10 Cans of beer per week   Drug use: Never   Sexual activity: Yes    Birth control/protection: Condom

## 2022-03-16 NOTE — Therapy (Unsigned)
OUTPATIENT PHYSICAL THERAPY TREATMENT NOTE RENEWAL   Patient Name: Robin [name] MRN: [id number] DOB:1971/03/20, 51 y.o., female Today's Date: 03/16/2022  PCP: Robin Hughs, NP REFERRING PROVIDER: Ngetich, Nelda Bucks, NP           Past Medical History:  Diagnosis Date   ADD (attention deficit disorder)    Allergy    Anemia    Anorexia nervosa with bulimia    Per [place] [facility] Patient Packet   Anxiety    Asthma    Carpal tunnel syndrome    Per [place] [facility] Patient Packet   Depression    Dry eyes    Per [place] [facility] Patient Packet   Dry mouth    Per [place] New Patient Packet   Fibroids    Per [facility] New Patient Packet   History of hip x-ray 2021   Per [place] [facility] Patient Packet   History of Papanicolaou smear of cervix 2021   Per [place] New Patient Packet, Robin [name]   Hyperlipidemia    Ileitis    Lupus ([place])    PONV (postoperative nausea and vomiting)    mild nausea    Raynaud disease    Retinal tear    Per [facility] New Patient Packet   Systemic lupus erythematosus ([place])    Past Surgical History:  Procedure Laterality Date   CARPAL TUNNEL RELEASE Right    08/2019   CESAREAN SECTION  2003   CESAREAN SECTION  2005   Per [facility] Patient Packet   COLONOSCOPY  2017   Robin [name]   DENTAL SURGERY  2009   Fromed tooth and chronic infection   DILITATION & CURRETTAGE/HYSTROSCOPY WITH NOVASURE ABLATION N/A 06/03/2020   Procedure: DILATATION & CURETTAGE/HYSTEROSCOPY WITH NOVASURE ABLATION;  Surgeon: Robin Halim, MD;  Location: [place];  Service: Gynecology;  Laterality: N/A;   ENDOMETRIAL BIOPSY  04/20/2020       EYE SURGERY Bilateral    cryoretinopexy   Patient Active Problem List   Diagnosis Date Noted   Right carpal tunnel syndrome 03/16/2022   Flushing 04/14/2021   Hypermobility syndrome 11/27/2020   Seasonal and perennial allergic rhinitis 11/06/2018   Mild intermittent asthma without complication 50/93/2671   Flexural atopic dermatitis 11/06/2018    History of hyperlipidemia 11/08/2016   History of anxiety 11/08/2016   History of retinal tear 11/08/2016   History of Bilateral plantar fasciitis 08/10/2016   High risk medication use 07/04/2016   Lupus ([place]) 05/23/2016   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[place]) 03/18/2016   ADHD (attention deficit hyperactivity disorder) 12/22/2014   Raynaud's phenomenon 10/17/2013   Hyperlipidemia 09/28/2011    REFERRING DIAG: M24.9 (ICD-10-CM) - Hypermobility of joint M53.3,G89.29 (ICD-10-CM) - Chronic left SI joint pain   THERAPY DIAG:  No diagnosis found.  PERTINENT HISTORY: see snapshot   PRECAUTIONS: none  SUBJECTIVE:    Patient returns for Re-Eval.  She has been on a elimination diet and that has helped my pain a lot! Has noted that the heat makes it worse. 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  Has some tightness in L hamstring as she was walking on the beach. Carpal tunnel has been flared up. Close to [employer].  Wants to get stronger now that her pain is better.     PAIN:  Are you having pain? Yes NPRS scale: 4/10 Pain location: SIJ , L hamstring 4//10 and feet 3/10.  Pain orientation: Bilateral  PAIN TYPE: aching, tight , sore  Pain description: intermittent  Aggravating factors: maybe more workouts, PM it "clenches" in low back  Relieving factors: HEP   OBJECTIVE:   *Unless otherwise noted, objective information collected previously*  DIAGNOSTIC FINDINGS:  None recent in SIJ   PATIENT SURVEYS:  Modified Oswestry NT     SCREENING FOR RED FLAGS: Bowel or bladder incontinence: No Spinal tumors: No Cauda equina syndrome: No Compression fracture: No Abdominal aneurysm: No   COGNITION:          Overall cognitive status: Within functional limits for tasks assessed                        SENSATION:          Light touch: Appears intact          Stereognosis: Appears intact          Hot/Cold: Appears intact          Proprioception:  Appears intact   POSTURE:  Rounded shoulders , L shoulder more forward than Rt  Lt PSIS higher than Rt in prone  Lt ASIS higher in supine than Rt      PALPATION: Pain along L lumbo sacral spine , paraspinals from low thoracic to low lumbar.  Palpation on Rt increased L sided "pressure".  Pressure with palpation on lateral SI border.    LUMBARAROM/PROM   A/PROM A/PROM  10/12/2021  Flexion Palms to floor   Extension Painful 25%  Right lateral flexion Pain R   Left lateral flexion Pain R   Right rotation WNL  Lt rotation WNL    (Blank rows = not tested)   LE AROM/PROM:     WNL throughout      LE MMT:   MMT Right 10/12/2021 Left 10/12/2021  Hip flexion      Hip extension 5/5 5/5  Hip abduction 5/5 5/5  Hip adduction      Hip internal rotation      Hip external rotation      Knee flexion 5/5 5/5  Knee extension 5/5 5/5  Ankle dorsiflexion      Ankle plantarflexion      Ankle inversion      Ankle eversion       (Blank rows = not tested)   LUMBAR SPECIAL TESTS:  Prone instability test: NT.  Pain with SIJ compression.   Pain on L with Rt SLS,  no pain with Lt SLS    FUNCTIONAL TESTS:  Single leg stance limited on L with pain in L lateral lumbar  region.      GAIT: Comments: No issues   [facility] Adult PT Treatment:                                                DATE: 03/17/22 Therapeutic Exercise: *** Manual Therapy: *** Neuromuscular re-ed: *** Therapeutic Activity: *** Modalities: *** Self Care: ***   Robin [name] Adult PT Treatment:                                                DATE: 03/01/22 Therapeutic Exercise: Warm up for hips, ankles and knees Squat x 10 , Rt knee pain  Wall sit 1 min with toe press for quad Dead  lift used 10 lbs DB for form , multiple reps with min cues  Knee extension 25 lbs x 10, 3 plates , can do single leg Rt 15 lbs and Lt. LE 20 lbs Reverse step up done with UE support and then holding 10 lbs KB  Self Care:   Strength vs stability and  building strength not that  Discussed POC, HEP and loading tissue to tolerance for muscle hypertrophy    [facility] Adult PT Treatment:                                                DATE: 12/28/21 Therapeutic Exercise: Elliptical ramp 10 resistance 9 for 5 min  Black band hip stabilization  Sit to stand, squat and then pulse x 30 sec each  Lateral band walking 5 x 15 feet Forward and backward walking x 1 min Pilates Tower for LE/Core strength, postural strength, lumbopelvic disassociation and core control.   Supine Leg [place] single purple x 15 Double leg yellow arcs x 10  added hip External Rotation x 10  Squat x 15 and small ROM circles    [facility] Adult PT Treatment:                                                DATE: 12/16/21 Therapeutic Exercise: Elliptical 5 min ramp 10, resist. 5  TRX /Airex squats , double then single TRX Curtsy x 10 to knee lift TRX hip hinge single leg used foam pad under pelvis  TRX step downs 6 inch x 10  each  Sideplank black band clam x 15-20 reps  S/L hip abd/extension black band x 15   Manual Therapy: Kinesiotape "X"  lumbar and then "Y" from deltoid up and 1 "I "strip transversely   [facility] Adult PT Treatment:                                                DATE: 12/09/2021 Therapeutic Exercise: Controlled single leg squat descent to table 2x10 BIL Squat false hops onto toes with waist attachment and two 10# cables at [employer] machine 3x10 Cross body knee drives in SLS with thigh attachment and 3# cable at [employer] machine x10 BIL Standing windmill stretch x10 BIL Manual Therapy: Skilled palpation to identify trigger points prior to TPDN Effleurage to Lt lumbar paraspinals following TPDN Sidelying grade 4 lumbar roll mobilization throughout lumbar spine x1 BIL Neuromuscular re-ed: N/A Therapeutic Activity: N/A Modalities: N/A Self Care: N/A    Trigger Point Dry-Needling  Treatment instructions: Expect mild to moderate muscle soreness. S/S of  pneumothorax if dry needled over a lung field, and to seek immediate medical attention should they occur. Patient verbalized understanding of these instructions and education.  Patient Consent Given: Yes Education handout provided: Yes Muscles treated: Lt lumbar multifidi L2-L3 in prone, Lt quadratus lumborum in sidelying Electrical stimulation performed: No Parameters: N/A Treatment response/outcome: two .30x50 needles placed into L2-L3 lumbar multifidi and left in situ x3 minutes after brief pistoning, followed by sidelying .75x50 needle places in Lt quadratus lumborum. Both result in improved muscle extensibility. No  adverse effects noted.      PATIENT EDUCATION:  Education details: PT/POC, SIJ , joint instability  Person educated: Patient Education method: Explanation Education comprehension: verbalized understanding, returned demonstration, and needs further education     HOME EXERCISE PROGRAM: [id number]  Wall sit Lateral band walk Dead lift Knee extension Reverse step up/wall glide lunge    ASSESSMENT:   CLINICAL IMPRESSION:   Patient overall doing well, able to identify what triggers her pain and dysautonomia symptoms.  She was renewed today for 6 more visits to focus on loading and strengthening.  Her pain is well controlled and she can manage this.  She wants to be able to run and be more fit in general without increasing pain.  She intends to go to [employer] and run with her dog.   Rt knee feels unstable to her with loaded knee flexion.   REHAB POTENTIAL: Excellent   CLINICAL DECISION MAKING: Stable/uncomplicated   EVALUATION COMPLEXITY: Low     GOALS: Goals reviewed with patient? Yes       LONG TERM GOALS:    LTG Name Target Date Goal status  1 Pt will be I with HEP for more advanced lumbopelvic , hip exercises Baseline:  In progress  2 Pt will be able to report centralization of pain > 75% of the time with ADLs, walking  Baseline:  met  3 Pt will be able  to stand on each LE for dynamic balance and stability exercises for 30 sec  Baseline: met for static balance   Ongoing   4 Pt will be able to hike, walk for 1 hour with no increased pain in lumbar spine Baseline:  Ongoing   5 Pt will be able to realize proper spinal alignment with standing and self correct Baseline:  Ongoing   6.  Pt will be able to lift, squat without increased pain    NEW  7.   Pt will be able to build endurance, able to run 1 mile without stopping    NEW    PLAN: PT FREQUENCY: 1 x per week  PT DURATION: 6 weeks   PLANNED INTERVENTIONS: Therapeutic exercises, Therapeutic activity, Neuro Muscular re-education, Balance training, Gait training, Patient/Family education, Joint mobilization, Dry Needling, Cryotherapy, Moist heat, Taping, Ionotophoresis 27m/ml Dexamethasone, and Manual therapy, Pilates   PLAN FOR NEXT SESSION:  Lifting, progressive loading  Cont POC, pelvic stabilty, dynamic balance and core    JRaeford Razor PT 03/16/22 2:03 PM Phone: [phone]: [phone]

## 2022-03-17 ENCOUNTER — Ambulatory Visit: Payer: BC Managed Care – PPO | Attending: Physician Assistant | Admitting: Physical Therapy

## 2022-03-17 ENCOUNTER — Encounter: Payer: Self-pay | Admitting: Physical Therapy

## 2022-03-17 DIAGNOSIS — M25552 Pain in left hip: Secondary | ICD-10-CM | POA: Diagnosis not present

## 2022-03-17 DIAGNOSIS — M2550 Pain in unspecified joint: Secondary | ICD-10-CM | POA: Insufficient documentation

## 2022-03-17 DIAGNOSIS — M5417 Radiculopathy, lumbosacral region: Secondary | ICD-10-CM | POA: Diagnosis not present

## 2022-03-17 DIAGNOSIS — M6281 Muscle weakness (generalized): Secondary | ICD-10-CM | POA: Diagnosis not present

## 2022-03-17 DIAGNOSIS — M25511 Pain in right shoulder: Secondary | ICD-10-CM | POA: Diagnosis not present

## 2022-03-17 DIAGNOSIS — M357 Hypermobility syndrome: Secondary | ICD-10-CM | POA: Insufficient documentation

## 2022-03-17 DIAGNOSIS — G8929 Other chronic pain: Secondary | ICD-10-CM | POA: Diagnosis not present

## 2022-03-23 ENCOUNTER — Other Ambulatory Visit: Payer: Self-pay | Admitting: Physician Assistant

## 2022-03-23 DIAGNOSIS — M3219 Other organ or system involvement in systemic lupus erythematosus: Secondary | ICD-10-CM

## 2022-03-24 ENCOUNTER — Ambulatory Visit: Payer: BC Managed Care – PPO | Admitting: Physical Therapy

## 2022-03-25 ENCOUNTER — Telehealth: Payer: Self-pay | Admitting: Physical Medicine and Rehabilitation

## 2022-03-25 NOTE — Telephone Encounter (Signed)
Pt returned call to [facility] to set an appt. Pt phone number is [phone]

## 2022-03-29 ENCOUNTER — Ambulatory Visit: Payer: BC Managed Care – PPO | Admitting: Physical Therapy

## 2022-03-29 ENCOUNTER — Encounter: Payer: Self-pay | Admitting: Physical Therapy

## 2022-03-29 DIAGNOSIS — M5417 Radiculopathy, lumbosacral region: Secondary | ICD-10-CM | POA: Diagnosis not present

## 2022-03-29 DIAGNOSIS — G8929 Other chronic pain: Secondary | ICD-10-CM

## 2022-03-29 DIAGNOSIS — M2550 Pain in unspecified joint: Secondary | ICD-10-CM

## 2022-03-29 DIAGNOSIS — M6281 Muscle weakness (generalized): Secondary | ICD-10-CM

## 2022-03-29 DIAGNOSIS — M25511 Pain in right shoulder: Secondary | ICD-10-CM | POA: Diagnosis not present

## 2022-03-29 DIAGNOSIS — M25552 Pain in left hip: Secondary | ICD-10-CM

## 2022-03-29 DIAGNOSIS — M357 Hypermobility syndrome: Secondary | ICD-10-CM

## 2022-03-29 NOTE — Therapy (Addendum)
OUTPATIENT PHYSICAL THERAPY TREATMENT NOTE DISCHARGE    Patient Name: Robin [name] MRN: [id number] DOB:Aug 25, 1971, 51 y.o., female Today's Date: 03/29/2022  PCP: Sandrea Hughs, NP REFERRING PROVIDER: Sandrea Hughs, NP   PT End of Session - 03/29/22 0856     Visit Number 11    Number of Visits 15    Date for PT Re-Evaluation 04/12/22    Authorization Type BCBS    PT Start Time [phone]    PT Stop Time 0930    PT Time Calculation (min) 35 min                    Past Medical History:  Diagnosis Date   ADD (attention deficit disorder)    Allergy    Anemia    Anorexia nervosa with bulimia    Per [place] [facility] Patient Packet   Anxiety    Asthma    Carpal tunnel syndrome    Per [place] [facility] Patient Packet   Depression    Dry eyes    Per [place] [facility] Patient Packet   Dry mouth    Per [place] New Patient Packet   Fibroids    Per [place] [facility] Patient Packet   History of hip x-ray 2021   Per [place] [facility] Patient Packet   History of Papanicolaou smear of cervix 2021   Per [place] New Patient Packet, Dr.Pickens   Hyperlipidemia    Ileitis    Lupus ([place])    PONV (postoperative nausea and vomiting)    mild nausea    Raynaud disease    Retinal tear    Per [facility] New Patient Packet   Systemic lupus erythematosus ([place])    Past Surgical History:  Procedure Laterality Date   CARPAL TUNNEL RELEASE Right    08/2019   CESAREAN SECTION  2003   CESAREAN SECTION  2005   Per [facility] New Patient Packet   COLONOSCOPY  2017   Dr.Jacobs   DENTAL SURGERY  2009   Fromed tooth and chronic infection   DILITATION & CURRETTAGE/HYSTROSCOPY WITH NOVASURE ABLATION N/A 06/03/2020   Procedure: DILATATION & CURETTAGE/HYSTEROSCOPY WITH NOVASURE ABLATION;  Surgeon: Aletha Halim, MD;  Location: [place];  Service: Gynecology;  Laterality: N/A;   ENDOMETRIAL BIOPSY  04/20/2020       EYE SURGERY Bilateral    cryoretinopexy   Patient Active Problem List   Diagnosis Date Noted   Right carpal  tunnel syndrome 03/16/2022   Flushing 04/14/2021   Hypermobility syndrome 11/27/2020   Seasonal and perennial allergic rhinitis 11/06/2018   Mild intermittent asthma without complication 87/68/1157   Flexural atopic dermatitis 11/06/2018   History of hyperlipidemia 11/08/2016   History of anxiety 11/08/2016   History of retinal tear 11/08/2016   History of Bilateral plantar fasciitis 08/10/2016   High risk medication use 07/04/2016   Lupus ([place]) 05/23/2016   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[place]) 03/18/2016   ADHD (attention deficit hyperactivity disorder) 12/22/2014   Raynaud's phenomenon 10/17/2013   Hyperlipidemia 09/28/2011    REFERRING DIAG: M24.9 (ICD-10-CM) - Hypermobility of joint M53.3,G89.29 (ICD-10-CM) - Chronic left SI joint pain   THERAPY DIAG:  Hypermobility syndrome  Radiculopathy, lumbosacral region  Polyarthralgia  Pain in left hip  Muscle weakness (generalized)  Chronic right shoulder pain  PERTINENT HISTORY: see snapshot   PRECAUTIONS: none  SUBJECTIVE:   My Lt side is flared up. The weather (heat) make it worse.  LLE is tight when I am in a flare (Mast Cell), feels  like a muscle pull but then it goes away. Everything is unstable.      PAIN:  Are you having pain? Yes NPRS scale: Pain location: SIJ , L hamstring 6/10   Pain orientation: L side  PAIN TYPE: aching, tight , sore  Pain description: intermittent  Aggravating factors: maybe more workouts, PM it "clenches" in low back  Relieving factors: HEP   OBJECTIVE:   *Unless otherwise noted, objective information collected previously*  DIAGNOSTIC FINDINGS:  None recent in SIJ   PATIENT SURVEYS:  Modified Oswestry NT     SCREENING FOR RED FLAGS: Bowel or bladder incontinence: No Spinal tumors: No Cauda equina syndrome: No Compression fracture: No Abdominal aneurysm: No   COGNITION:          Overall cognitive status: Within functional limits for tasks assessed                        SENSATION:           Light touch: Appears intact          Stereognosis: Appears intact          Hot/Cold: Appears intact          Proprioception: Appears intact   POSTURE:  Rounded shoulders , L shoulder more forward than Rt  Lt PSIS higher than Rt in prone  Lt ASIS higher in supine than Rt      PALPATION: Pain along L lumbo sacral spine , paraspinals from low thoracic to low lumbar.  Palpation on Rt increased L sided "pressure".  Pressure with palpation on lateral SI border.    LUMBARAROM/PROM   A/PROM A/PROM  10/12/2021  Flexion Palms to floor   Extension Painful 25%  Right lateral flexion Pain R   Left lateral flexion Pain R   Right rotation WNL  Lt rotation WNL    (Blank rows = not tested)   LE AROM/PROM:     WNL throughout      LE MMT:   MMT Right 10/12/2021 Left 10/12/2021  Hip flexion      Hip extension 5/5 5/5  Hip abduction 5/5 5/5  Hip adduction      Hip internal rotation      Hip external rotation      Knee flexion 5/5 5/5  Knee extension 5/5 5/5  Ankle dorsiflexion      Ankle plantarflexion      Ankle inversion      Ankle eversion       (Blank rows = not tested)   LUMBAR SPECIAL TESTS:  Prone instability test: NT.  Pain with SIJ compression.   Pain on L with Rt SLS,  no pain with Lt SLS    FUNCTIONAL TESTS:  Single leg stance limited on L with pain in L lateral lumbar  region.  GAIT: Comments: No issues    [facility] Adult PT Treatment:                                                DATE: 03/29/22 Therapeutic Exercise: Recumbent bike L 2 for 5 min , gathering subjective info and plan for session  Manual Therapy: Soft tissue mobilization to L QL, glute medius, paraspinals and L semimembranosis   Trigger Point Dry-Needling  Treatment instructions: Expect mild to moderate muscle soreness. S/S of pneumothorax if dry  needled over a lung field, and to seek immediate medical attention should they occur. Patient verbalized understanding of these instructions and  education.  Patient Consent Given: Yes Education handout provided: Yes Muscles treated: Lt lumbar multifidi L2-L3 in prone, Lt quadratus lumborum in sidelying Electrical stimulation performed: No Parameters: N/A Treatment response/outcome: two .30x50 needles placed into L2-L3 lumbar multifidi and left in situ x3 minutes after brief pistoning, followed by sidelying .75x50 needle places in Lt quadratus lumborum, gluteals over iliac crest .  L medial hamstring.  Both result in improved muscle extensibility. No adverse effects noted.  Self Care: Resources for cash based dry needling   POC   [facility] Adult PT Treatment:                                                DATE: 03/17/22 Therapeutic Exercise: Elliptical 5 min L 10 ramp and L5 resist.  Supine bridge x 3 x blue band with clam and march x 15  Sit to stand  single leg x 10  Dead lift x 15 lbs  15  Dead lift 25 lbs x 15 Stagger stance dead lift 25 lbs LLE x 8 reps Forearm plank on wobble board on mat table: lateral step outs x 8 each and then slow mtn climb x 8 each side  Ball crunch x 10  added knee lift alternating x 10 each        PATIENT EDUCATION:  Education details: PT/POC, SIJ , joint instability  Person educated: Patient Education method: Explanation Education comprehension: verbalized understanding, returned demonstration, and needs further education     HOME EXERCISE PROGRAM: [id number]  Wall sit Lateral band walk Dead lift Knee extension Reverse step up/wall glide lunge    ASSESSMENT:   CLINICAL IMPRESSION:   Dailyn has developed core and limb strength, stability in her many visits here.  She has a good plan for HEP and understands the need for continued strengthening. She has high level of body awareness. She will attend PT 1 more visit to  finalize plan, HEP.  Session predominately manual therapy and dry needling, performed by Voncille Lo, PT.   REHAB POTENTIAL: Excellent    CLINICAL DECISION MAKING:  Stable/uncomplicated   EVALUATION COMPLEXITY: Low     GOALS: Goals reviewed with patient? Yes       LONG TERM GOALS:    LTG Name Target Date Goal status  1 Pt will be I with HEP for more advanced lumbopelvic , hip exercises Baseline:  In progress  2 Pt will be able to report centralization of pain > 75% of the time with ADLs, walking  Baseline:  met  3 Pt will be able to stand on each LE for dynamic balance and stability exercises for 30 sec  Baseline: met for static balance   Ongoing   4 Pt will be able to hike, walk for 1 hour with no increased pain in lumbar spine Baseline:  Ongoing   5 Pt will be able to realize proper spinal alignment with standing and self correct Baseline:  MET  6.  Pt will be able to lift, squat without increased pain    MET   7.   Pt will be able to build endurance, able to run 1 mile without stopping .  Status : has not done this due to air quality and heat  Ongoing     PLAN: PT FREQUENCY: 1 x per week  PT DURATION: 6 weeks   PLANNED INTERVENTIONS: Therapeutic exercises, Therapeutic activity, Neuro Muscular re-education, Balance training, Gait training, Patient/Family education, Joint mobilization, Dry Needling, Cryotherapy, Moist heat, Taping, Ionotophoresis 25m/ml Dexamethasone, and Manual therapy, Pilates   PLAN FOR NEXT SESSION:  HEP final DC, goals  How was needling?    JRaeford Razor PT 03/29/22 9:14 AM Phone: [phone]: [phone]  PHYSICAL THERAPY DISCHARGE SUMMARY  Visits from Start of Care: 11  Current functional level related to goals / functional outcomes: See above    Remaining deficits: At the time of this note above, patient had maximized rehab potential.    Education / Equipment: HEP, core, alignment    Patient agrees to discharge. Patient goals were met. Patient is being discharged due to meeting the stated rehab goals. But patient cancelled her last appt due to pain.  She has not called back to reschedule.     JRaeford Razor PT 05/18/22 8:23 AM Phone: [phone]: [phone]

## 2022-04-04 NOTE — Therapy (Deleted)
OUTPATIENT PHYSICAL THERAPY TREATMENT NOTE  DISCHARGE    Patient Name: Robin [name] MRN: [id number] DOB:09-13-1970, 51 y.o., female Today's Date: 04/04/2022  PCP: Sandrea Hughs, NP REFERRING PROVIDER: Ngetich, Nelda Bucks, NP            Past Medical History:  Diagnosis Date   ADD (attention deficit disorder)    Allergy    Anemia    Anorexia nervosa with bulimia    Per [place] New Patient Packet   Anxiety    Asthma    Carpal tunnel syndrome    Per [place] New Patient Packet   Depression    Dry eyes    Per [place] New Patient Packet   Dry mouth    Per [place] New Patient Packet   Fibroids    Per [facility] New Patient Packet   History of hip x-ray 2021   Per [place] New Patient Packet   History of Papanicolaou smear of cervix 2021   Per [place] New Patient Packet, Dr.Pickens   Hyperlipidemia    Ileitis    Lupus ([place])    PONV (postoperative nausea and vomiting)    mild nausea    Raynaud disease    Retinal tear    Per [facility] New Patient Packet   Systemic lupus erythematosus ([place])    Past Surgical History:  Procedure Laterality Date   CARPAL TUNNEL RELEASE Right    08/2019   CESAREAN SECTION  2003   CESAREAN SECTION  2005   Per [facility] New Patient Packet   COLONOSCOPY  2017   Dr.Jacobs   DENTAL SURGERY  2009   Fromed tooth and chronic infection   DILITATION & CURRETTAGE/HYSTROSCOPY WITH NOVASURE ABLATION N/A 06/03/2020   Procedure: DILATATION & CURETTAGE/HYSTEROSCOPY WITH NOVASURE ABLATION;  Surgeon: Aletha Halim, MD;  Location: [place];  Service: Gynecology;  Laterality: N/A;   ENDOMETRIAL BIOPSY  04/20/2020       EYE SURGERY Bilateral    cryoretinopexy   Patient Active Problem List   Diagnosis Date Noted   Right carpal tunnel syndrome 03/16/2022   Flushing 04/14/2021   Hypermobility syndrome 11/27/2020   Seasonal and perennial allergic rhinitis 11/06/2018   Mild intermittent asthma without complication 53/61/4431   Flexural atopic dermatitis 11/06/2018    History of hyperlipidemia 11/08/2016   History of anxiety 11/08/2016   History of retinal tear 11/08/2016   History of Bilateral plantar fasciitis 08/10/2016   High risk medication use 07/04/2016   Lupus ([place]) 05/23/2016   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  PERTINENT HISTORY: see snapshot   PRECAUTIONS: none  SUBJECTIVE:    ***  My Lt side is flared up. The weather (heat) make it worse.  LLE is tight when I am in a flare (Mast Cell), feels like a muscle pull but then it goes away. Everything is unstable.      PAIN:  Are you having pain? Yes NPRS scale: Pain location: SIJ , L hamstring 6/10   Pain orientation: L side  PAIN TYPE: aching, tight , sore  Pain description: intermittent  Aggravating factors: maybe more workouts, PM it "clenches" in low back  Relieving factors: HEP   OBJECTIVE:   *Unless otherwise noted, objective information collected previously*  DIAGNOSTIC FINDINGS:  None  recent in SIJ   PATIENT SURVEYS:  Modified Oswestry NT     SCREENING FOR RED FLAGS: Bowel or bladder incontinence: No Spinal tumors: No Cauda equina syndrome: No Compression fracture: No Abdominal aneurysm: No   COGNITION:          Overall cognitive status: Within functional limits for tasks assessed                        SENSATION:          Light touch: Appears intact          Stereognosis: Appears intact          Hot/Cold: Appears intact          Proprioception: Appears intact   POSTURE:  Rounded shoulders , L shoulder more forward than Rt  Lt PSIS higher than Rt in prone  Lt ASIS higher in supine than Rt      PALPATION: Pain along L lumbo sacral spine , paraspinals from low thoracic to  low lumbar.  Palpation on Rt increased L sided "pressure".  Pressure with palpation on lateral SI border.    LUMBARAROM/PROM   A/PROM A/PROM  10/12/2021  Flexion Palms to floor   Extension Painful 25%  Right lateral flexion Pain R   Left lateral flexion Pain R   Right rotation WNL  Lt rotation WNL    (Blank rows = not tested)   LE AROM/PROM:     WNL throughout      LE MMT:   MMT Right 10/12/2021 Left 10/12/2021  Hip flexion      Hip extension 5/5 5/5  Hip abduction 5/5 5/5  Hip adduction      Hip internal rotation      Hip external rotation      Knee flexion 5/5 5/5  Knee extension 5/5 5/5  Ankle dorsiflexion      Ankle plantarflexion      Ankle inversion      Ankle eversion       (Blank rows = not tested)   LUMBAR SPECIAL TESTS:  Prone instability test: NT.  Pain with SIJ compression.   Pain on L with Rt SLS,  no pain with Lt SLS    FUNCTIONAL TESTS:  Single leg stance limited on L with pain in L lateral lumbar  region.  GAIT: Comments: No issues     [facility] Adult PT Treatment:                                                DATE: 04/04/22 Therapeutic Exercise: *** Manual Therapy: *** Neuromuscular re-ed: *** Therapeutic Activity: *** Modalities: *** Self Care: ***     [name] Adult PT Treatment:                                                DATE: 03/29/22 Therapeutic Exercise: Recumbent bike L 2 for 5 min , gathering subjective info and plan for session  Manual Therapy: Soft tissue mobilization to L QL, glute medius, paraspinals and L semimembranosis   Trigger Point Dry-Needling  Treatment instructions: Expect mild to moderate muscle soreness. S/S of pneumothorax if dry needled over a lung field, and to seek  immediate medical attention should they occur. Patient verbalized understanding of these instructions and education.  Patient Consent Given: Yes Education handout provided: Yes Muscles treated: Lt lumbar multifidi L2-L3 in prone, Lt quadratus  lumborum in sidelying Electrical stimulation performed: No Parameters: N/A Treatment response/outcome: two .30x50 needles placed into L2-L3 lumbar multifidi and left in situ x3 minutes after brief pistoning, followed by sidelying .75x50 needle places in Lt quadratus lumborum, gluteals over iliac crest .  L medial hamstring.  Both result in improved muscle extensibility. No adverse effects noted.  Self Care: Resources for cash based dry needling   POC   [facility] Adult PT Treatment:                                                DATE: 03/17/22 Therapeutic Exercise: Elliptical 5 min L 10 ramp and L5 resist.  Supine bridge x 3 x blue band with clam and march x 15  Sit to stand  single leg x 10  Dead lift x 15 lbs  15  Dead lift 25 lbs x 15 Stagger stance dead lift 25 lbs LLE x 8 reps Forearm plank on wobble board on mat table: lateral step outs x 8 each and then slow mtn climb x 8 each side  Ball crunch x 10  added knee lift alternating x 10 each        PATIENT EDUCATION:  Education details: PT/POC, SIJ , joint instability  Person educated: Patient Education method: Explanation Education comprehension: verbalized understanding, returned demonstration, and needs further education     HOME EXERCISE PROGRAM: [id number]  Wall sit Lateral band walk Dead lift Knee extension Reverse step up/wall glide lunge    ASSESSMENT:   CLINICAL IMPRESSION:   Robin [name] has developed core and limb strength, stability in her many visits here.  She has a good plan for HEP and understands the need for continued strengthening. She has high level of body awareness. She will attend PT 1 more visit to  finalize plan, HEP.  Session predominately manual therapy and dry needling, performed by Voncille Lo, PT.   REHAB POTENTIAL: Excellent    CLINICAL DECISION MAKING: Stable/uncomplicated   EVALUATION COMPLEXITY: Low     GOALS: Goals reviewed with patient? Yes       LONG TERM GOALS:    LTG Name Target  Date Goal status  1 Pt will be I with HEP for more advanced lumbopelvic , hip exercises Baseline:  In progress  2 Pt will be able to report centralization of pain > 75% of the time with ADLs, walking  Baseline:  met  3 Pt will be able to stand on each LE for dynamic balance and stability exercises for 30 sec  Baseline: met for static balance   Ongoing   4 Pt will be able to hike, walk for 1 hour with no increased pain in lumbar spine Baseline:  Ongoing   5 Pt will be able to realize proper spinal alignment with standing and self correct Baseline:  MET  6.  Pt will be able to lift, squat without increased pain    MET   7.   Pt will be able to build endurance, able to run 1 mile without stopping .  Status : has not done this due to air quality and heat   Ongoing     PLAN: PT  FREQUENCY: 1 x per week  PT DURATION: 6 weeks   PLANNED INTERVENTIONS: Therapeutic exercises, Therapeutic activity, Neuro Muscular re-education, Balance training, Gait training, Patient/Family education, Joint mobilization, Dry Needling, Cryotherapy, Moist heat, Taping, Ionotophoresis 58m/ml Dexamethasone, and Manual therapy, Pilates   PLAN FOR NEXT SESSION:  HEP final DC, goals  How was needling?    JRaeford Razor PT 04/04/22 8:38 AM Phone: [phone]: [phone]

## 2022-04-05 ENCOUNTER — Ambulatory Visit: Payer: BC Managed Care – PPO | Admitting: Allergy & Immunology

## 2022-04-05 ENCOUNTER — Encounter: Payer: BC Managed Care – PPO | Admitting: Physical Therapy

## 2022-04-05 VITALS — BP 110/60 | HR 67 | Temp 98.1°F | Resp 16 | Wt 162.8 lb

## 2022-04-05 DIAGNOSIS — J3089 Other allergic rhinitis: Secondary | ICD-10-CM

## 2022-04-05 DIAGNOSIS — D8940 Mast cell activation, unspecified: Secondary | ICD-10-CM

## 2022-04-05 DIAGNOSIS — J4520 Mild intermittent asthma, uncomplicated: Secondary | ICD-10-CM | POA: Diagnosis not present

## 2022-04-05 DIAGNOSIS — L2089 Other atopic dermatitis: Secondary | ICD-10-CM | POA: Diagnosis not present

## 2022-04-05 DIAGNOSIS — J302 Other seasonal allergic rhinitis: Secondary | ICD-10-CM

## 2022-04-05 MED ORDER — CROMOLYN SODIUM 100 MG/5ML PO CONC: 200.0000 mg | Freq: Four times a day (QID) | ORAL | 5 refills | Status: DC

## 2022-04-05 NOTE — Patient Instructions (Addendum)
1. Mild intermittent asthma, uncomplicated - Lung testing looked good today.  - We will not make any medication changes today. - Continue with albuterol every 4 hours as needed.    2. Seasonal and perennial allergic rhinitis (grasses, indoor molds, outdoor molds, dust mites, cat and cockroach) - Continue with an over the counter antihistamine daily.  - Continue with Singulair daily as needed. - Continue with Astelin as needed.  - Continue with allergy shots at the same schedule.    3. Flexural atopic dermatitis - Continue with moisturizing twice daily as needed.  4. 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 Deneen Harts at [facility] cromolyn 237m four times daily to see if this provides any relief.  - I can certainly consider doing the naltrexone in the future.  - Give me an update in 2-4 weeks.  - Continue with the Singulair twice daily. - Continue with the Pepcid.   5. Return in about 6 months (around 10/06/2022).    Please inform [place]of any Emergency Department visits, hospitalizations, or changes in symptoms. Call u[place]abefore going to the ED for breathing or allergy symptoms since we might be able to fit you in for a sick visit. Feel free to contact u[place]anytime with any questions, problems, or concerns.  It was a pleasure to see you and your family again today!  Websites that have reliable patient information: 1. American Academy of Asthma, Allergy, and Immunology: www.aaaai.org 2. Food Allergy Research and Education (FARE): foodallergy.org 3. Mothers of Asthmatics: http://www.asthmacommunitynetwork.org 4. American College of Allergy, Asthma, and Immunology: www.acaai.org   COVID-19 Vaccine Information can be found at: [email or web address] questions related to vaccine distribution or appointments, please email [email or web address] or call [phone]     "Like" [facility] Facebook and Instagram for  our latest updates!       Make sure you are registered to vote! If you have moved or changed any of your contact information, you will need to get this updated before voting!  In some cases, you MAY be able to register to vote online: [email or web address]

## 2022-04-05 NOTE — Progress Notes (Signed)
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 Estanislado Pandy  Plan/Recommendations:   1. Mild intermittent asthma, uncomplicated - Lung testing looked good today.  - We will not make any medication changes today. - Continue with albuterol every 4 hours as needed.    2. Seasonal and perennial allergic rhinitis (grasses, indoor molds, outdoor molds, dust mites, cat and cockroach) - Continue with an over the counter antihistamine daily.  - Continue with Singulair daily as needed. - Continue with Astelin as needed.  - Continue with allergy shots at the same schedule.    3. Flexural atopic dermatitis - Continue with moisturizing twice daily as needed.  4. 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 Deneen Harts at [facility]. - Start cromolyn 292m four times daily to see if this provides any relief.  - I can certainly consider doing the naltrexone in the future.  - Give me an update in 2-4 weeks.  - Continue with the Singulair twice daily. - Continue with the Pepcid.   5. Return in about 6 months (around 10/06/2022).     Subjective:   Robin NGUis a 51y.o. female presenting today for follow up of  Chief Complaint  Patient presents with   Follow-up    Want to discuss the symptoms she has been having with mast cell with the doctor and discuss next steps. Some occasional congestion but using azelastine.     Robin HOFFARThas a history of the following: Patient Active Problem List   Diagnosis Date Noted   Right carpal tunnel syndrome  03/16/2022   Flushing 04/14/2021   Hypermobility syndrome 11/27/2020   Seasonal and perennial allergic rhinitis 11/06/2018   Mild intermittent asthma without complication 000/37/0488  Flexural atopic dermatitis 11/06/2018   History of hyperlipidemia 11/08/2016   History of anxiety 11/08/2016   History of retinal tear 11/08/2016   History of Bilateral plantar fasciitis 08/10/2016   High risk medication use 07/04/2016   Lupus ([place] 05/23/2016   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 03/18/2016   ADHD (attention deficit hyperactivity disorder) 12/22/2014   Raynaud's phenomenon 10/17/2013   Hyperlipidemia 09/28/2011    History obtained from: chart review and patient.  Robin [name] a 51y.o. female presenting for a follow up visit.  She was last seen in May 2023.  At that time, we did not do lung testing.  We continue with albuterol as needed.  For her allergic rhinitis, we continue with allergy shots as well as Astelin and Singulair.  Atopic dermatitis was controlled with moisturizing twice daily.  She was concerned with mast cell activation syndrome.  We obtained quite a bit of labs, all of which were normal.  She did want to change her Singulair to twice daily, which we did.  We continue with her Pepcid.  Since last visit, she has mostly done well. She celebrated her birthday by renting a pool with [employer] where you can rent pools for parties and whatnot.   She is not surprised by the normal lab testing. Evidently this is normal to have normal labs.  She did double her Singulair and this only helped her allergies. She is fine as long as she keeps her strict diet. She has to bring her own food wherever she goes. The heat makes things particularly bad. She gets stomach cramps and GI cramps and then severe pain in the legs. She has been doing physical therapy to try to strengthen everything. Her "flare spots" start hurting and she feels that she has the flu for a day or two.    She wants to confirm a diagnosis of  mast cell activation syndrome. There is someone in [place] who does dysautonmia. But she does not have much in the way of POTS symptoms, so she did not feel that this would have been helpful. She is wondering if there is a specialist who is close by.   Otherwise, there have been no changes to her past medical history, surgical history, family history, or social history.    Review of Systems  Constitutional: Negative.  Negative for chills, fever, malaise/fatigue and weight loss.  HENT: Negative.  Negative for congestion, ear discharge, ear pain and sinus pain.   Eyes:  Negative for pain, discharge and redness.  Respiratory:  Negative for cough, sputum production, shortness of breath and wheezing.   Cardiovascular: Negative.  Negative for chest pain and palpitations.  Gastrointestinal:  Negative for abdominal pain, constipation, diarrhea, heartburn, nausea and vomiting.  Skin: Negative.  Negative for itching and rash.  Neurological:  Negative for dizziness and headaches.  Endo/Heme/Allergies:  Negative for environmental allergies. Does not bruise/bleed easily.       Objective:   Blood pressure 110/60, pulse 67, temperature 98.1 F (36.7 C), temperature source Temporal, resp. rate 16, weight 162 lb 12.8 oz (73.8 kg), SpO2 98 %. Body mass index is 27.09 kg/m.    Physical Exam Vitals reviewed.  Constitutional:      Appearance: She is well-developed.     Comments: Very friendly. Talkative.   HENT:     Head: Normocephalic and atraumatic.     Right Ear: Tympanic membrane, ear canal and external ear normal.     Left Ear: Tympanic membrane, ear canal and external ear normal.     Nose: No nasal deformity, septal deviation, mucosal edema or rhinorrhea.     Right Turbinates: Enlarged, swollen and pale.     Left Turbinates: Enlarged, swollen and pale.     Right Sinus: No maxillary sinus tenderness or frontal sinus tenderness.     Left Sinus: No maxillary sinus tenderness or frontal sinus  tenderness.     Mouth/Throat:     Lips: Pink.     Mouth: Mucous membranes are moist. Mucous membranes are not pale and not dry.     Pharynx: Uvula midline.  Eyes:     General: Allergic shiner present.        Right eye: No discharge.        Left eye: No discharge.     Conjunctiva/sclera: Conjunctivae normal.     Right eye: Right conjunctiva is not injected. No chemosis.    Left eye: Left conjunctiva is not injected. No chemosis.    Pupils: Pupils are equal, round, and reactive to light.  Cardiovascular:     Rate and Rhythm: Normal rate and regular rhythm.     Heart sounds: Normal heart sounds.  Pulmonary:     Effort: Pulmonary effort is normal. No tachypnea, accessory muscle usage or respiratory distress.     Breath sounds: Normal breath sounds. No wheezing, rhonchi  or rales.     Comments: Moving air well in all lung fields. Chest:     Chest wall: No tenderness.  Lymphadenopathy:     Cervical: No cervical adenopathy.  Skin:    Coloration: Skin is not pale.     Findings: No abrasion, erythema, petechiae or rash. Rash is not papular, urticarial or vesicular.  Neurological:     Mental Status: She is alert.  Psychiatric:        Behavior: Behavior is cooperative.      Diagnostic studies:    Spirometry: results normal (FEV1: 2.37/80%, FVC: 3.03/81%, FEV1/FVC: 78%).    Spirometry consistent with normal pattern.   Allergy Studies: none        Salvatore Marvel, MD  [facility] and [place] [facility] [place]

## 2022-04-12 ENCOUNTER — Ambulatory Visit: Payer: BC Managed Care – PPO | Admitting: Physical Therapy

## 2022-04-13 ENCOUNTER — Encounter: Payer: Self-pay | Admitting: Allergy & Immunology

## 2022-04-18 ENCOUNTER — Encounter: Payer: Self-pay | Admitting: Allergy & Immunology

## 2022-04-18 DIAGNOSIS — R6889 Other general symptoms and signs: Secondary | ICD-10-CM

## 2022-04-18 DIAGNOSIS — D8940 Mast cell activation, unspecified: Secondary | ICD-10-CM

## 2022-04-18 DIAGNOSIS — G901 Familial dysautonomia [Riley-Day]: Secondary | ICD-10-CM

## 2022-04-19 ENCOUNTER — Ambulatory Visit: Payer: BC Managed Care – PPO | Admitting: Physical Therapy

## 2022-04-20 DIAGNOSIS — F331 Major depressive disorder, recurrent, moderate: Secondary | ICD-10-CM | POA: Diagnosis not present

## 2022-04-21 ENCOUNTER — Encounter: Payer: Self-pay | Admitting: Family Medicine

## 2022-04-21 ENCOUNTER — Ambulatory Visit: Payer: Self-pay

## 2022-04-21 ENCOUNTER — Ambulatory Visit: Payer: BC Managed Care – PPO | Admitting: Family Medicine

## 2022-04-21 VITALS — BP 120/80 | Ht 65.0 in | Wt 162.0 lb

## 2022-04-21 DIAGNOSIS — M25562 Pain in left knee: Secondary | ICD-10-CM

## 2022-04-21 DIAGNOSIS — M7122 Synovial cyst of popliteal space [Baker], left knee: Secondary | ICD-10-CM

## 2022-04-21 MED ORDER — TRIAMCINOLONE ACETONIDE 40 MG/ML IJ SUSP
40.0000 mg | Freq: Once | INTRAMUSCULAR | Status: AC
  Administered 2022-04-21: 40 mg via INTRA_ARTICULAR

## 2022-04-21 NOTE — Progress Notes (Signed)
Robin [name] - 51 y.o. female MRN [id number]  Date of birth: [birth year]  SUBJECTIVE:  Including CC & ROS.  No chief complaint on file.   Robin [name] is a 51 y.o. female that is presenting with acute left knee pain.  The pain is anterior and posterior nature.  No specific injury.   Review of Systems See HPI   HISTORY: Past Medical, Surgical, Social, and Family History Reviewed & Updated per EMR.   Pertinent Historical Findings include:  Past Medical History:  Diagnosis Date   ADD (attention deficit disorder)    Allergy    Anemia    Anorexia nervosa with bulimia    Per [place] New Patient Packet   Anxiety    Asthma    Carpal tunnel syndrome    Per [place] New Patient Packet   Depression    Dry eyes    Per [place] New Patient Packet   Dry mouth    Per [place] New Patient Packet   Fibroids    Per [facility] New Patient Packet   History of hip x-ray 2021   Per [place] New Patient Packet   History of Papanicolaou smear of cervix 2021   Per [place] New Patient Packet, Dr.Pickens   Hyperlipidemia    Ileitis    Lupus ([place])    PONV (postoperative nausea and vomiting)    mild nausea    Raynaud disease    Retinal tear    Per [facility] New Patient Packet   Systemic lupus erythematosus ([place])     Past Surgical History:  Procedure Laterality Date   CARPAL TUNNEL RELEASE Right    08/2019   CESAREAN SECTION  2003   CESAREAN SECTION  2005   Per [facility] New Patient Packet   COLONOSCOPY  2017   Dr.Jacobs   DENTAL SURGERY  2009   Fromed tooth and chronic infection   DILITATION & CURRETTAGE/HYSTROSCOPY WITH NOVASURE ABLATION N/A 06/03/2020   Procedure: DILATATION & CURETTAGE/HYSTEROSCOPY WITH NOVASURE ABLATION;  Surgeon: Aletha Halim, MD;  Location: [place];  Service: Gynecology;  Laterality: N/A;   ENDOMETRIAL BIOPSY  04/20/2020       EYE SURGERY Bilateral    cryoretinopexy     PHYSICAL EXAM:  VS: BP 120/80 (BP Location: Left Arm, Patient Position: Sitting)   Ht 5' 5"  (1.651 m)    Wt 162 lb (73.5 kg)   BMI 26.96 kg/m  Physical Exam Gen: NAD, alert, cooperative with exam, well-appearing MSK:  Neurovascularly intact    Limited ultrasound: Left knee:  Mild effusion suprapatellar pouch. Normal-appearing quadricep and patellar tendon. No significant changes in the medial or lateral joint space. Moderate Baker's cyst  Summary: Baker's cyst appreciated  Ultrasound and interpretation by Clearance Coots, MD  Aspiration/Injection Procedure Note Robin [name] 1971/08/17  Procedure: Aspiration and Injection Indications: Left knee pain  Procedure Details Consent: Risks of procedure as well as the alternatives and risks of each were explained to the (patient/caregiver).  Consent for procedure obtained. Time Out: Verified patient identification, verified procedure, site/side was marked, verified correct patient position, special equipment/implants available, medications/allergies/relevent history reviewed, required imaging and test results available.  Performed.  The area was cleaned with iodine and alcohol swabs.    The left Baker's cyst was injected using 3 cc of 1% lidocaine on a 25-gauge 1-1/2 inch needle.  An 18-gauge 1-1/2 inch needle was used to achieve aspiration.  The syringe was switched to mixture containing 1 cc's of 40 mg Kenalog and 4  cc's of 0.25% bupivacaine was injected.  Ultrasound was used. Images were obtained in long views showing the injection.    Amount of Fluid Aspirated: 2m Character of Fluid: clear and straw colored Fluid was sent for: n/a  A sterile dressing was applied.  Patient did tolerate procedure well.     ASSESSMENT & PLAN:   Baker's cyst of knee, left Acutely occurring.  Effusion appreciated as well as a significant Baker's cyst as to the source of her problems. -Counseled on home exercise therapy and supportive care. -Aspiration and injection. -Could consider further imaging or physical therapy.

## 2022-04-21 NOTE — Assessment & Plan Note (Signed)
Acutely occurring.  Effusion appreciated as well as a significant Baker's cyst as to the source of her problems. -Counseled on home exercise therapy and supportive care. -Aspiration and injection. -Could consider further imaging or physical therapy.

## 2022-04-21 NOTE — Patient Instructions (Signed)
Good to see you Please use ice as needed  Please consider compression   Please send me a message in MyChart with any questions or updates.  Please see me back in 4-6 weeks.   --Dr. Raeford Razor

## 2022-04-22 ENCOUNTER — Encounter: Payer: Self-pay | Admitting: Physical Medicine and Rehabilitation

## 2022-04-22 ENCOUNTER — Ambulatory Visit (INDEPENDENT_AMBULATORY_CARE_PROVIDER_SITE_OTHER): Payer: BC Managed Care – PPO | Admitting: Physical Medicine and Rehabilitation

## 2022-04-22 DIAGNOSIS — R202 Paresthesia of skin: Secondary | ICD-10-CM

## 2022-04-22 NOTE — Progress Notes (Signed)
Pt state left hand numbness and pain. Pt state it happens mostly at night and the pain keeps her up. Pt state it starts from her upper back and shoulder. The pain will travels down her arm to her hand, she has numbness in her left thumb, point and middle fingers. Pt state he hand can feel sore and very painful. Pt state hard time holding items and she has been dropping items. Pt state she takes over the counter pain meds and uses pain cream to help ease her pain. Pt state she also uses a brace to help her pain. Pt state she right handed.  Numeric Pain Rating Scale and Functional Assessment Average Pain 3   In the last MONTH (on 0-10 scale) has pain interfered with the following?  1. General activity like being  able to carry out your everyday physical activities such as walking, climbing stairs, carrying groceries, or moving a chair?  Rating(8)   -BT,

## 2022-04-24 NOTE — Procedures (Signed)
EMG & NCV Findings: Evaluation of the left median motor nerve showed prolonged distal onset latency (6.0 ms), reduced amplitude (4.8 mV), and decreased conduction velocity (Elbow-Wrist, 43 m/s).  The left median (across palm) sensory nerve showed prolonged distal peak latency (Wrist, 4.7 ms) and prolonged distal peak latency (Palm, 2.6 ms).  The left ulnar sensory nerve showed reduced amplitude (14.8 V).  All remaining nerves (as indicated in the following tables) were within normal limits.    All examined muscles (as indicated in the following table) showed no evidence of electrical instability.    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 There is no significant electrodiagnostic evidence of any other focal nerve entrapment, brachial plexopathy or cervical radiculopathy.   Recommendations: 1.  Follow-up with referring physician. 2.  Continue current management of symptoms. 3.  Continue use of resting splint at night-time and as needed during the day. 4.  Suggest surgical evaluation.  ___________________________ Laurence Spates FAAPMR Board Certified, American Board of Physical Medicine and Rehabilitation    Nerve Conduction Studies Anti Sensory Summary Table   Stim Site NR Peak (ms) Norm Peak (ms) P-T Amp (V) Norm P-T Amp Site1 Site2 Delta-P (ms) Dist (cm) Vel (m/s) Norm Vel (m/s)  Left Median Acr Palm Anti Sensory (2nd Digit)  32.8C  Wrist    *4.7 <3.6 22.2 >10 Wrist Palm 2.1 0.0    Palm    *2.6 <2.0 8.4         Left Radial Anti Sensory (Base 1st Digit)  31.9C  Wrist    2.3 <3.1 30.8  Wrist Base 1st Digit 2.3 0.0    Left Ulnar Anti Sensory (5th Digit)  32.3C  Wrist    3.6 <3.7 *14.8 >15.0 Wrist 5th Digit 3.6 14.0 39 >38   Motor Summary Table   Stim Site NR Onset (ms) Norm Onset (ms) O-P Amp (mV) Norm O-P Amp Site1  Site2 Delta-0 (ms) Dist (cm) Vel (m/s) Norm Vel (m/s)  Left Median Motor (Abd Poll Brev)  31.9C  Wrist    *6.0 <4.2 *4.8 >5 Elbow Wrist 4.9 21.0 *43 >50  Elbow    10.9  3.9         Left Ulnar Motor (Abd Dig Min)  32C  Wrist    2.7 <4.2 7.7 >3 B Elbow Wrist 3.5 20.0 57 >53  B Elbow    6.2  7.5  A Elbow B Elbow 1.6 9.0 56 >53  A Elbow    7.8  7.9          EMG   Side Muscle Nerve Root Ins Act Fibs Psw Amp Dur Poly Recrt Int Fraser Din Comment  Left Abd Poll Brev Median C8-T1 Nml Nml Nml Nml Nml 0 Nml Nml   Left 1stDorInt Ulnar C8-T1 Nml Nml Nml Nml Nml 0 Nml Nml   Left PronatorTeres Median C6-7 Nml Nml Nml Nml Nml 0 Nml Nml   Left Biceps Musculocut C5-6 Nml Nml Nml Nml Nml 0 Nml Nml   Left Deltoid Axillary C5-6 Nml Nml Nml Nml Nml 0 Nml Nml     Nerve Conduction Studies Anti Sensory Left/Right Comparison   Stim Site L Lat (ms) R Lat (ms) L-R Lat (ms) L Amp (V) R Amp (V) L-R Amp (%) Site1 Site2 L Vel (m/s) R Vel (m/s) L-R Vel (m/s)  Median Acr Palm Anti Sensory (2nd Digit)  32.8C  Wrist *4.7   22.2   Wrist Palm     Palm *2.6   8.4         Radial Anti Sensory (Base 1st Digit)  31.9C  Wrist 2.3   30.8   Wrist Base 1st Digit     Ulnar Anti Sensory (5th Digit)  32.3C  Wrist 3.6   *14.8   Wrist 5th Digit 39     Motor Left/Right Comparison   Stim Site L Lat (ms) R Lat (ms) L-R Lat (ms) L Amp (mV) R Amp (mV) L-R Amp (%) Site1 Site2 L Vel (m/s) R Vel (m/s) L-R Vel (m/s)  Median Motor (Abd Poll Brev)  31.9C  Wrist *6.0   *4.8   Elbow Wrist *43    Elbow 10.9   3.9         Ulnar Motor (Abd Dig Min)  32C  Wrist 2.7   7.7   B Elbow Wrist 57    B Elbow 6.2   7.5   A Elbow B Elbow 56    A Elbow 7.8   7.9            Waveforms:

## 2022-04-26 ENCOUNTER — Encounter: Payer: Self-pay | Admitting: Allergy & Immunology

## 2022-04-26 ENCOUNTER — Other Ambulatory Visit: Payer: Self-pay

## 2022-04-26 MED ORDER — CROMOLYN SODIUM 100 MG/5ML PO CONC: 200.0000 mg | Freq: Four times a day (QID) | ORAL | 5 refills | Status: AC

## 2022-04-28 ENCOUNTER — Encounter: Payer: Self-pay | Admitting: Family Medicine

## 2022-04-30 NOTE — Progress Notes (Signed)
Robin [name] - 51 y.o. female MRN [id number]  Date of birth: [birth year]  Office Visit Note: Visit Date: 04/22/2022 PCP: Sandrea Hughs, NP Referred by: Leandrew Koyanagi, MD  Subjective: Chief Complaint  Patient presents with   Neck - Numbness, Pain   Left Shoulder - Numbness, Pain   Left Arm - Numbness, Pain   Left Hand - Numbness, Pain   HPI:  Robin [name] is a 51 y.o. female who comes in today at the request of Dr. Eduard Roux for electrodiagnostic study of the Left upper extremities.  Patient is Right hand dominant. She reports left hand numbness and pain that happens mostly at night and the pain keeps her up. She feels that it starts from her upper back and shoulder. The pain will travels down her arm to her hand, she has numbness in her left thumb, index and middle fingers. .   ROS Otherwise per HPI.  Assessment & Plan: Visit Diagnoses:    ICD-10-CM   1. 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 There is no significant electrodiagnostic evidence of any other focal nerve entrapment, brachial plexopathy or cervical radiculopathy.   Recommendations: 1.  Follow-up with referring physician. 2.  Continue current management of symptoms. 3.  Continue use of resting splint at night-time and as needed during the day. 4.  Suggest surgical evaluation.  Meds & Orders: No orders of the defined types were placed in this encounter.   Orders Placed This Encounter  Procedures   NCV with EMG (electromyography)    Follow-up: Return in about 2 weeks (around 05/06/2022) for Eduard Roux, MD.   Procedures: No procedures performed  EMG & NCV Findings: Evaluation of the left median motor nerve showed prolonged distal onset latency (6.0 ms), reduced  amplitude (4.8 mV), and decreased conduction velocity (Elbow-Wrist, 43 m/s).  The left median (across palm) sensory nerve showed prolonged distal peak latency (Wrist, 4.7 ms) and prolonged distal peak latency (Palm, 2.6 ms).  The left ulnar sensory nerve showed reduced amplitude (14.8 V).  All remaining nerves (as indicated in the following tables) were within normal limits.    All examined muscles (as indicated in the following table) showed no evidence of electrical instability.    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 There is no significant electrodiagnostic evidence of any other focal nerve entrapment, brachial plexopathy or cervical radiculopathy.   Recommendations: 1.  Follow-up with referring physician. 2.  Continue current management of symptoms. 3.  Continue use of resting splint at night-time and as needed during the day. 4.  Suggest surgical evaluation.  ___________________________ Laurence Spates FAAPMR Board Certified, American Board of Physical Medicine and Rehabilitation    Nerve Conduction Studies Anti Sensory Summary Table   Stim Site NR Peak (ms) Norm Peak (ms) P-T Amp (V) Norm P-T Amp Site1 Site2 Delta-P (ms) Dist (cm) Vel (m/s) Norm Vel (m/s)  Left Median Acr Palm Anti Sensory (2nd Digit)  32.8C  Wrist    *4.7 <3.6 22.2 >10 Wrist Palm 2.1 0.0    Palm    *2.6 <2.0 8.4  Left Radial Anti Sensory (Base 1st Digit)  31.9C  Wrist    2.3 <3.1 30.8  Wrist Base 1st Digit 2.3 0.0    Left Ulnar Anti Sensory (5th Digit)  32.3C  Wrist    3.6 <3.7 *14.8 >15.0 Wrist 5th Digit 3.6 14.0 39 >38   Motor Summary Table   Stim Site NR Onset (ms) Norm Onset (ms) O-P Amp (mV) Norm O-P Amp Site1 Site2 Delta-0 (ms) Dist (cm) Vel (m/s) Norm Vel (m/s)  Left Median Motor (Abd Poll Brev)  31.9C  Wrist    *6.0 <4.2  *4.8 >5 Elbow Wrist 4.9 21.0 *43 >50  Elbow    10.9  3.9         Left Ulnar Motor (Abd Dig Min)  32C  Wrist    2.7 <4.2 7.7 >3 B Elbow Wrist 3.5 20.0 57 >53  B Elbow    6.2  7.5  A Elbow B Elbow 1.6 9.0 56 >53  A Elbow    7.8  7.9          EMG   Side Muscle Nerve Root Ins Act Fibs Psw Amp Dur Poly Recrt Int Fraser Din Comment  Left Abd Poll Brev Median C8-T1 Nml Nml Nml Nml Nml 0 Nml Nml   Left 1stDorInt Ulnar C8-T1 Nml Nml Nml Nml Nml 0 Nml Nml   Left PronatorTeres Median C6-7 Nml Nml Nml Nml Nml 0 Nml Nml   Left Biceps Musculocut C5-6 Nml Nml Nml Nml Nml 0 Nml Nml   Left Deltoid Axillary C5-6 Nml Nml Nml Nml Nml 0 Nml Nml     Nerve Conduction Studies Anti Sensory Left/Right Comparison   Stim Site L Lat (ms) R Lat (ms) L-R Lat (ms) L Amp (V) R Amp (V) L-R Amp (%) Site1 Site2 L Vel (m/s) R Vel (m/s) L-R Vel (m/s)  Median Acr Palm Anti Sensory (2nd Digit)  32.8C  Wrist *4.7   22.2   Wrist Palm     Palm *2.6   8.4         Radial Anti Sensory (Base 1st Digit)  31.9C  Wrist 2.3   30.8   Wrist Base 1st Digit     Ulnar Anti Sensory (5th Digit)  32.3C  Wrist 3.6   *14.8   Wrist 5th Digit 39     Motor Left/Right Comparison   Stim Site L Lat (ms) R Lat (ms) L-R Lat (ms) L Amp (mV) R Amp (mV) L-R Amp (%) Site1 Site2 L Vel (m/s) R Vel (m/s) L-R Vel (m/s)  Median Motor (Abd Poll Brev)  31.9C  Wrist *6.0   *4.8   Elbow Wrist *43    Elbow 10.9   3.9         Ulnar Motor (Abd Dig Min)  32C  Wrist 2.7   7.7   B Elbow Wrist 57    B Elbow 6.2   7.5   A Elbow B Elbow 56    A Elbow 7.8   7.9            Waveforms:             Clinical History: No specialty comments available.     Objective:  VS:  HT:    WT:   BMI:     BP:   HR: bpm  TEMP: ( )  RESP:  Physical Exam Musculoskeletal:        General: No swelling, tenderness or deformity.     Comments: Inspection  reveals no atrophy of the bilateral APB or FDI or hand intrinsics. There is no swelling, color changes, allodynia or  dystrophic changes. There is 5 out of 5 strength in the bilateral wrist extension, finger abduction and long finger flexion. There is intact sensation to light touch in all dermatomal and peripheral nerve distributions. There is a positive Phalen's test on the left. There is a negative Hoffmann's test bilaterally.  Skin:    General: Skin is warm and dry.     Findings: No erythema or rash.  Neurological:     General: No focal deficit present.     Mental Status: She is alert and oriented to person, place, and time.     Motor: No weakness or abnormal muscle tone.     Coordination: Coordination normal.  Psychiatric:        Mood and Affect: Mood normal.        Behavior: Behavior normal.      Imaging: No results found.

## 2022-05-02 ENCOUNTER — Other Ambulatory Visit: Payer: Self-pay | Admitting: Physician Assistant

## 2022-05-02 DIAGNOSIS — M3219 Other organ or system involvement in systemic lupus erythematosus: Secondary | ICD-10-CM

## 2022-05-02 NOTE — Telephone Encounter (Signed)
Next Visit: 05/26/2022  Last Visit: 12/23/2021   Labs: 09/23/2021 Alk phos is low-stable. Rest of CMP WNL.  CBC WNL.   Eye exam: 06/03/2021 WNL    Current Dose per office note 12/23/2021: Plaquenil 200 mg 1 tablet by mouth twice daily.  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 Advised patient she will be due to update her eye exam next month.   Okay to refill Plaquenil?

## 2022-05-04 DIAGNOSIS — F331 Major depressive disorder, recurrent, moderate: Secondary | ICD-10-CM | POA: Diagnosis not present

## 2022-05-04 IMAGING — DX DG HAND COMPLETE 3+V*L*
3 series · 3 of 3 positions shown · non-contrast
Comparison: None.

CLINICAL DATA: Fall, hand injury

EXAM:
LEFT HAND - COMPLETE 3+ VIEW

[hand pa]
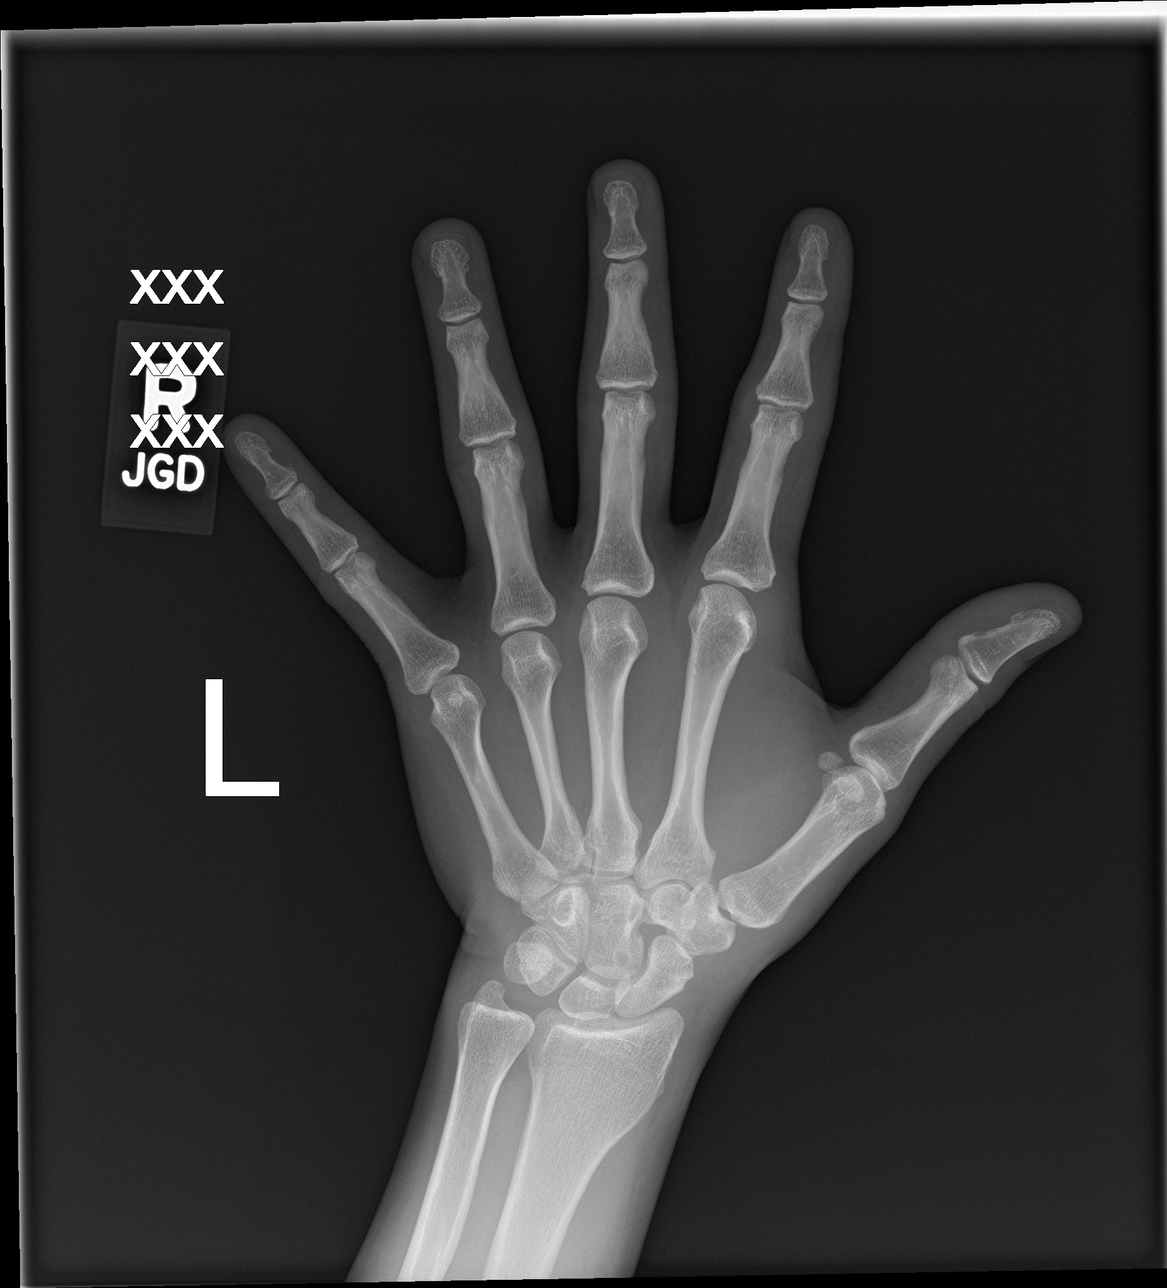

[hand obl]
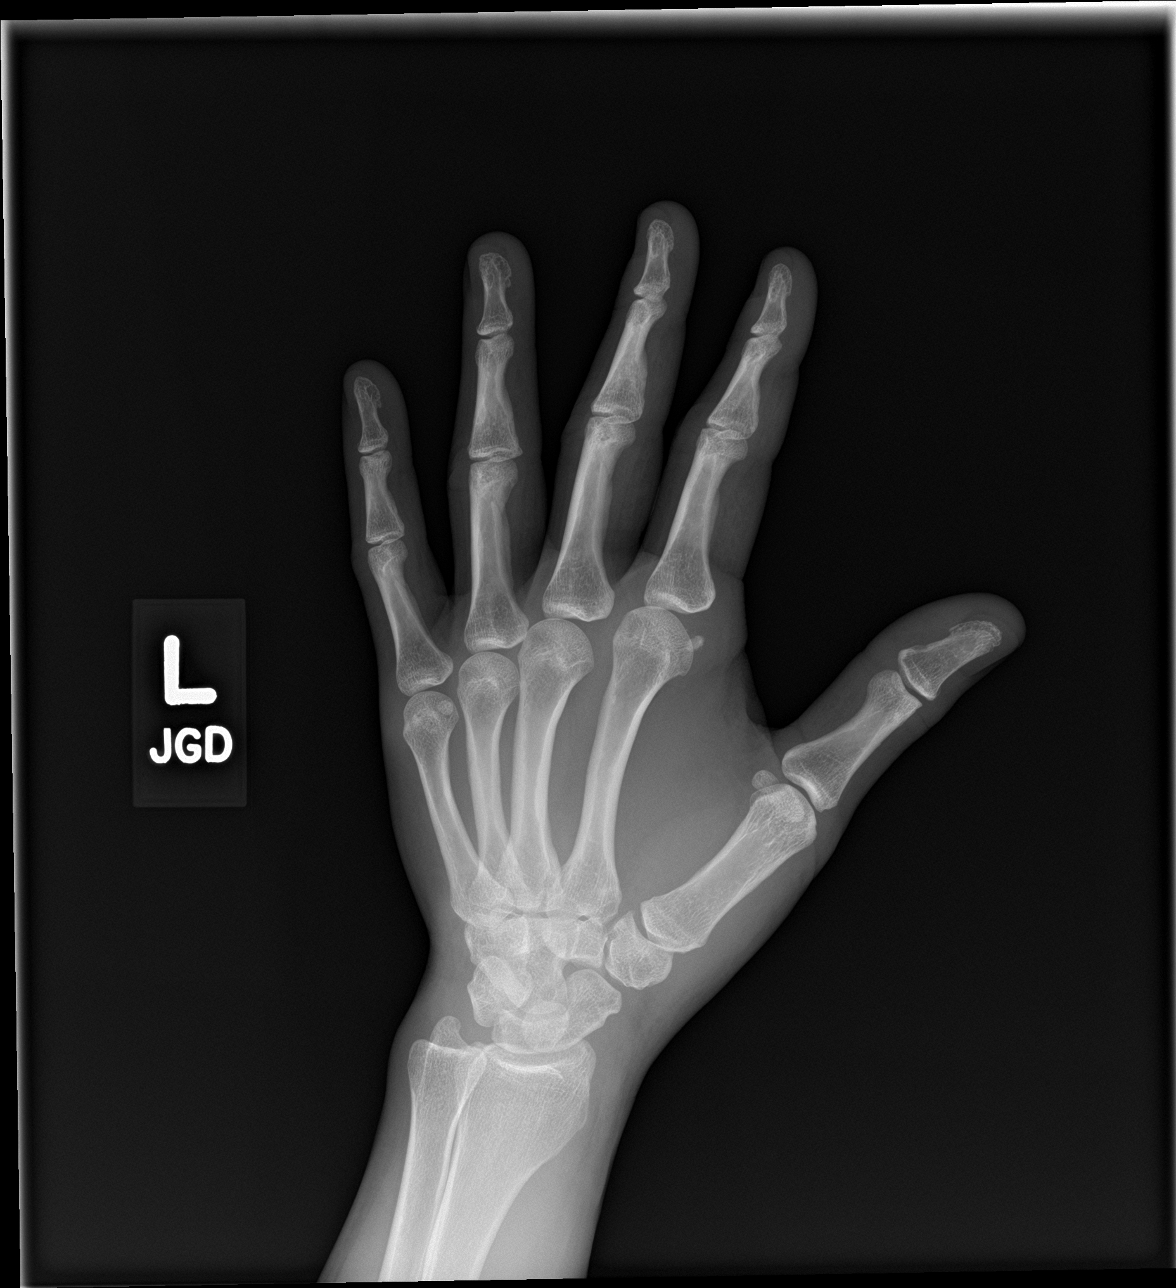

[hand lat]
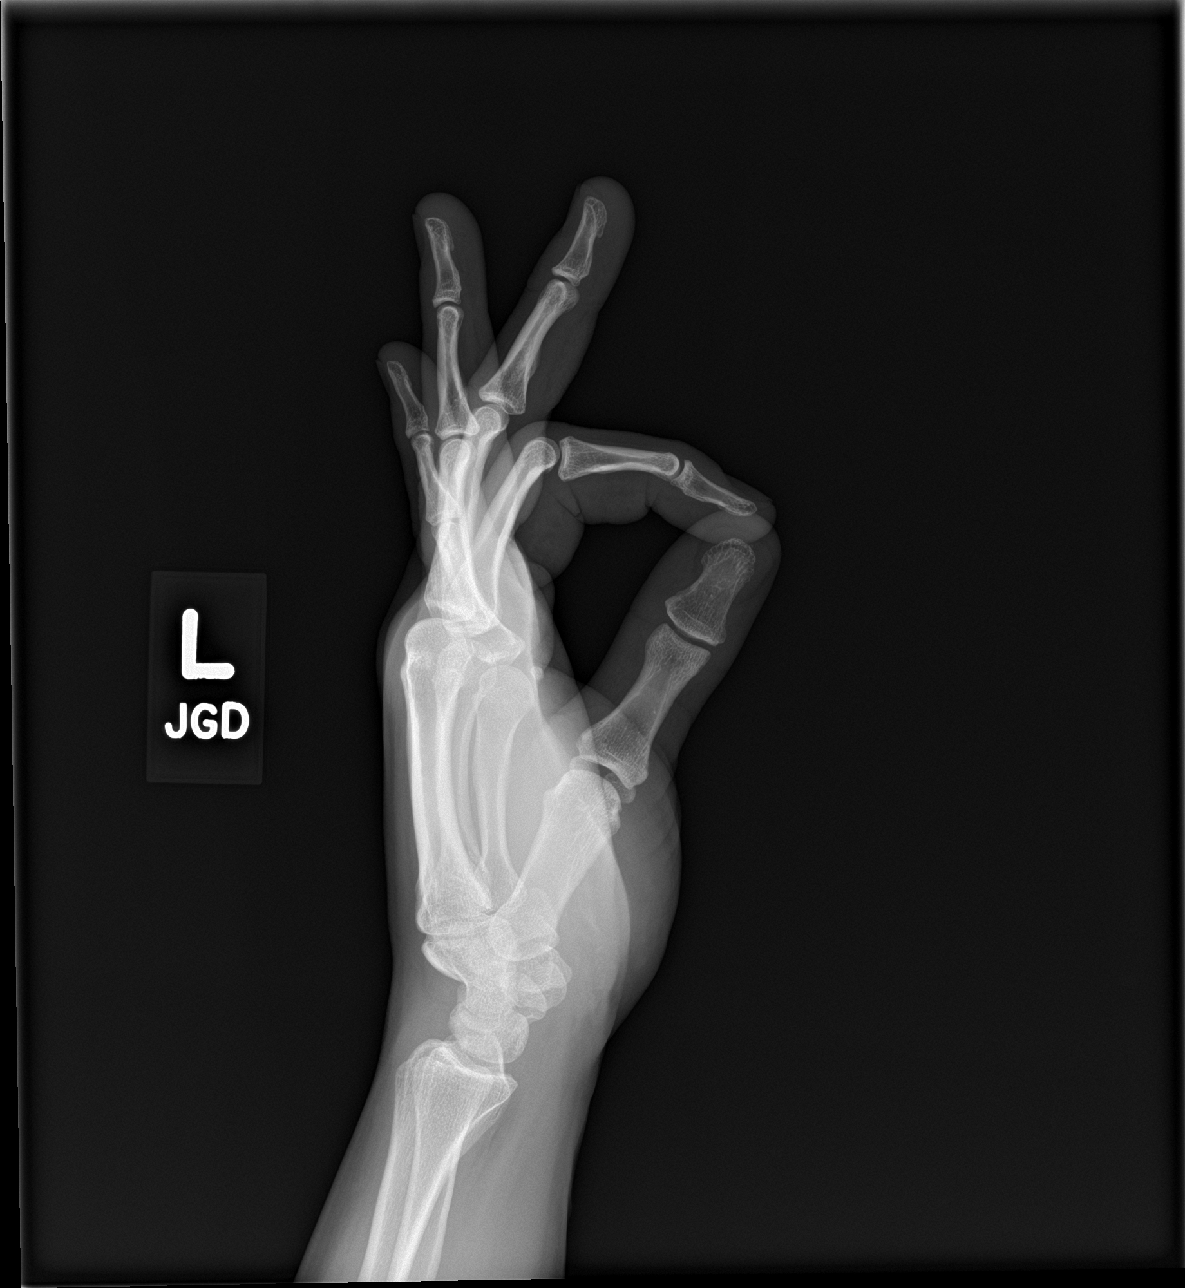

[3 of 3 positions shown; findings below may reference images not displayed]

FINDINGS: There is no evidence of fracture or dislocation. There is no
evidence of arthropathy or other focal bone abnormality. Soft
tissues are unremarkable.
IMPRESSION: No acute osseous abnormality.

If there is anatomic snuffbox tenderness/clinical concern for occult
scaphoid fracture, recommend MRI or splinting and follow up x-rays
in 10-14 days.

## 2022-05-11 ENCOUNTER — Encounter: Payer: Self-pay | Admitting: Family Medicine

## 2022-05-12 DIAGNOSIS — F411 Generalized anxiety disorder: Secondary | ICD-10-CM | POA: Diagnosis not present

## 2022-05-12 DIAGNOSIS — F902 Attention-deficit hyperactivity disorder, combined type: Secondary | ICD-10-CM | POA: Diagnosis not present

## 2022-05-12 DIAGNOSIS — F339 Major depressive disorder, recurrent, unspecified: Secondary | ICD-10-CM | POA: Diagnosis not present

## 2022-05-12 NOTE — Progress Notes (Signed)
Office Visit Note  Patient: Robin [name]             Date of Birth: [birth year]           MRN: [id number]             PCP: Sandrea Hughs, NP Referring: Sandrea Hughs, NP Visit Date: 05/26/2022 Occupation: @GUAROCC @  Subjective:  Medication monitoring   History of Present Illness: Robin [name] is a 51 y.o. female with history of systemic lupus erythematosus and osteoarthritis.  She is taking Plaquenil 200 mg 1 tablet by mouth twice daily.  She continues to tolerate Plaquenil without any side effects and has not missed any doses recently.  She denies any signs or symptoms of a systemic lupus flare.  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  She tried eliminating histamines from her diet which has been helpful in multiple ways.  Her generalized inflammation has improved.  She has noticed less swelling in her hands and feet.  She will be seeing a mast cell specialist at [facility] in February 2024 for further evaluation and management. Patient reports that she currently has 1 canker sore but will be following up with her dentist today for further evaluation for problems she was having with one of her molars.  She denies any sores in her nose.  She continues to have chronic dry mouth and dry eyes.  She has ongoing symptoms of Raynaud's but denies any digital ulcerations.  She has not noticed a Maller rash recently.  No recent hair loss.  She remains active exercising on a regular basis.    Activities of Daily Living:  Patient reports morning stiffness for 1 hour.   Patient Denies nocturnal pain.  Difficulty dressing/grooming: Denies Difficulty climbing stairs: Denies Difficulty getting out of chair: Reports Difficulty using hands for taps, buttons, cutlery, and/or writing: Reports  Review of Systems  Constitutional:  Positive for fatigue.  HENT:  Positive for mouth sores and mouth  dryness.   Eyes:  Positive for dryness.  Respiratory:  Positive for shortness of breath.   Cardiovascular:  Negative for chest pain and palpitations.  Gastrointestinal:  Positive for constipation. Negative for blood in stool and diarrhea.  Endocrine: Negative for increased urination.  Genitourinary:  Negative for involuntary urination.  Musculoskeletal:  Positive for joint pain, joint pain, joint swelling, myalgias, morning stiffness, muscle tenderness and myalgias. Negative for gait problem and muscle weakness.  Skin:  Positive for color change and sensitivity to sunlight. Negative for rash and hair loss.  Allergic/Immunologic: Positive for susceptible to infections.  Neurological:  Positive for dizziness, numbness and headaches.  Hematological:  Negative for swollen glands.  Psychiatric/Behavioral:  Negative for depressed mood and sleep disturbance. The patient is not nervous/anxious.     PMFS History:  Patient Active Problem List   Diagnosis Date Noted   Left carpal tunnel syndrome 05/26/2022   Foot sprain, left, initial encounter 05/19/2022   Baker's cyst of knee, left 04/21/2022   Right carpal tunnel syndrome 03/16/2022   Flushing 04/14/2021   Hypermobility syndrome 11/27/2020   Seasonal and perennial allergic rhinitis 11/06/2018   Mild intermittent asthma without complication 40/98/1191   Flexural atopic dermatitis 11/06/2018   History of hyperlipidemia 11/08/2016   History of anxiety 11/08/2016   History of retinal tear 11/08/2016   History of Bilateral plantar fasciitis 08/10/2016   High risk medication  use 07/04/2016   Lupus ([place]) 05/23/2016   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[place]) 03/18/2016   ADHD (attention deficit hyperactivity disorder) 12/22/2014   Raynaud's phenomenon 10/17/2013   Hyperlipidemia 09/28/2011    Past Medical History:  Diagnosis Date   ADD (attention deficit disorder)    Allergy    Anemia    Anorexia nervosa with bulimia    Per [place] New Patient Packet    Anxiety    Asthma    Carpal tunnel syndrome    Per [place] New Patient Packet   Depression    Dry eyes    Per [place] New Patient Packet   Dry mouth    Per [place] New Patient Packet   Fibroids    Per [place] New Patient Packet   History of hip x-ray 2021   Per [place] New Patient Packet   History of Papanicolaou smear of cervix 2021   Per [place] New Patient Packet, Dr.Pickens   Hyperlipidemia    Ileitis    Lupus ([place])    PONV (postoperative nausea and vomiting)    mild nausea    Raynaud disease    Retinal tear    Per [place] New Patient Packet   Systemic lupus erythematosus ([place])     Family History  Problem Relation Age of Onset   Heart disease Mother 62       CAD/stenting   Hyperlipidemia Mother    Arthritis Mother        ostearthritis   Hypertension Mother    Kidney disease Mother    Irritable bowel syndrome Mother    Stroke Mother 20       CVA   Fibromyalgia Mother    Eczema Mother    Sjogren's syndrome Mother    Hyperlipidemia Father    Neuropathy Father    Allergies Father        shellfish   Prostatitis Father    Hyperlipidemia Sister    ADD / ADHD Sister    Bipolar disorder Sister    Depression Sister    Hypertension Brother    OCD Brother    Anxiety disorder Daughter    ADD / ADHD Daughter    Asthma Daughter    OCD Son    Colon cancer Neg Hx    Inflammatory bowel disease Neg Hx    Allergic rhinitis Neg Hx    Angioedema Neg Hx    Atopy Neg Hx    Immunodeficiency Neg Hx    Urticaria Neg Hx    Past Surgical History:  Procedure Laterality Date   CARPAL TUNNEL RELEASE Right    08/2019   CESAREAN SECTION  2003   CESAREAN SECTION  2005   Per [facility] Patient Packet   COLONOSCOPY  2017   Dr.Jacobs   cyst drained Left 2023   knee   DENTAL SURGERY  2009   Fromed tooth and chronic infection   DILITATION & CURRETTAGE/HYSTROSCOPY WITH NOVASURE ABLATION N/A 06/03/2020   Procedure: DILATATION & CURETTAGE/HYSTEROSCOPY WITH NOVASURE ABLATION;  Surgeon: Aletha Halim, MD;   Location: [place];  Service: Gynecology;  Laterality: N/A;   ENDOMETRIAL BIOPSY  04/20/2020       EYE SURGERY Bilateral    cryoretinopexy   Social History   Social History Narrative   Marital status: married x 17 years; happily married      Children: 2 children (40, 89)      Lives: with husband, 2 children.      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 4 (including me)       Pets: 1 dog, 2 cats       Highest level or education completed: Undergraduate degree       Current/Past profession: Actuary       Exercise:        Yes          Type and how often: 30-60 min walking 5 days a week. Pilate's 1 x a week, and horseback riding 2 times a week          Living Will: No   DNR: no, would like to discuss one    POA/HPOA: No      Functional Status:   Do you have difficulty bathing or dressing yourself? Left blank    Do you have difficulty preparing food or eating? Left blank   Do you have difficulty managing your medications? Left blank   Do you have difficulty managing your finances? Left blank   Do you have difficulty affording your medications? Left blank   Immunization History  Administered Date(s) Administered   Influenza,inj,Quad PF,6+ Mos 05/12/2014, 11/27/2015, 05/23/2016, 10/30/2017, 07/30/2018   Moderna Sars-Covid-2 Vaccination 10/27/2020, 10/31/2020   PFIZER(Purple Top)SARS-COV-2 Vaccination 12/06/2019, 12/16/2019, 05/27/2020   Pneumococcal Conjugate-13  05/23/2016   Tdap 09/28/2011, 08/12/2020     Objective: Vital Signs: BP 133/83 (BP Location: Left Arm, Patient Position: Sitting, Cuff Size: Normal)   Pulse 83   Resp 15   Ht 5' 6"  (1.676 m)   Wt 161 lb 6.4 oz (73.2 kg)   BMI 26.05 kg/m    Physical Exam Vitals and nursing note reviewed.  Constitutional:      Appearance: She is well-developed.  HENT:     Head: Normocephalic and atraumatic.  Eyes:     Conjunctiva/sclera: Conjunctivae normal.  Cardiovascular:     Rate and Rhythm: Normal rate and regular rhythm.     Heart sounds: Normal heart sounds.  Pulmonary:     Effort: Pulmonary effort is normal.     Breath sounds: Normal breath sounds.  Abdominal:     General: Bowel sounds are normal.     Palpations: Abdomen is soft.  Musculoskeletal:     Cervical back: Normal range of motion.  Skin:    General: Skin is warm and dry.     Capillary Refill: Capillary refill takes 2 to 3 seconds.  Neurological:     Mental Status: She is alert and oriented to person, place, and time.  Psychiatric:        Behavior: Behavior normal.      Musculoskeletal Exam: C-spine, thoracic spine, and lumbar spine good ROM.  Tenderness over left SI joint. Shoulder joints, elbow joints, wrist joints, MCPs, PIPs, and DIPs good ROM with no synovitis. PIP and DIP thickening  consistent with osteoarthritis of both hands. Complete fist formation bilaterally.  Hip joints, knee joints, and ankle joints have good ROM with no discomfort.  No warmth or effusion of knee joints.  No tenderness or swelling of ankle joints.  Thickening of bilateral 1st and 5th MTP joints.  Dorsal spurring noted bilaterally.   CDAI Exam: CDAI Score: -- Patient Global: --; Provider Global: -- Swollen: --; Tender: -- Joint Exam 05/26/2022   No joint exam has been documented for this visit   There is currently no information documented on the homunculus. Go to the Rheumatology activity and complete the homunculus joint  exam.  Investigation: No additional findings.  Imaging: No results found.  Recent Labs: Lab Results  Component Value Date   WBC 4.6 09/23/2021   HGB 14.0 09/23/2021   PLT 295 09/23/2021   NA 138 09/23/2021   K 4.6 09/23/2021   CL 101 09/23/2021   CO2 31 09/23/2021   GLUCOSE 75 09/23/2021   BUN 13 09/23/2021   CREATININE 0.83 09/23/2021   BILITOT 0.6 09/23/2021   ALKPHOS 26 (L) 06/01/2020   AST 22 09/23/2021   ALT 17 09/23/2021   PROT 7.6 09/23/2021   ALBUMIN 3.8 06/01/2020   CALCIUM 10.0 09/23/2021   GFRAA 93 01/26/2021    Speciality Comments: PLQ Eye Exam: 06/03/2021 WNL @ [facility] (in [place]) Follow up in 1 year.  ANA 1:320 speckled, history of oral ulcers, lymphadenopathy, photosensitivity, Raynauds, erythema nodosum, inflammatory arthritis.  Procedures:  No procedures performed Allergies: Cromolyn sodium, Avelox [moxifloxacin hcl in nacl], Cefaclor, Dust mite extract, Latex, Mold extract [trichophyton], Red wine complex [germanium], Meloxicam, and Naproxen      Assessment / Plan:     Visit Diagnoses: Other systemic lupus erythematosus with other organ involvement (HCC) - ANA 1:320 speckled, history of oral ulcers, lymphadenopathy, photosensitivity, Raynauds, erythema nodosum, inflammatory arthritis: She has not had any signs or symptoms of a systemic lupus flare.  She has clinically been doing well taking Plaquenil 200 mg 1 tablet by mouth twice daily.  She continues to tolerate Plaquenil without any side effects.  No clinical features of a flare at this time were noted.  She had no synovitis on examination today.  She continues to experience intermittent symptoms of Raynaud's and had delayed capillary refill 2 to 3 seconds on examination today.  No digital ulcerations were noted.  No Malar rash noted.  No signs of alopecia.  Autoimmune lab work drawn on 09/23/2021: ANA 1: 40 NS, ESR within normal limits, complements within normal limits, and  double-stranded DNA negative.  The following lab work will be updated today for further evaluation. Of note the patient has been under the care of Dr. Ernst Bowler for the concern of possible MCAS-mast cell activation syndrome.  This past summer she tried to eliminate histamines from her diet which seem to improve her generalized inflammation.  There has been discussion of trying low-dose naltrexone in the future.  For now she remains on Singulair and Pepcid.  She is awaiting an appointment at [facility] with a mast cell specialist. The patient was advised to notify us if she develops signs or symptoms of a systemic lupus flare.  She will remain on Plaquenil as prescribed.  She will follow-up in the office in 5 months or sooner if needed. - Plan: CBC with Differential/Platelet, COMPLETE METABOLIC PANEL WITH GFR, Protein / creatinine ratio, urine, Anti-DNA antibody, double-stranded, C3 and C4, Sedimentation rate, hydroxychloroquine (PLAQUENIL) 200 MG tablet  High risk  medication use - Plaquenil 200 mg 1 tablet by mouth twice daily.  PLQ Eye Exam: 06/03/2021 WNL @ [facility] (in [place]) Follow up in 1 year.  She is scheduled for her Plaquenil eye examination in December 2023. CBC and CMP updated today.   - Plan: CBC with Differential/Platelet, COMPLETE METABOLIC PANEL WITH GFR  Raynaud's phenomenon without gangrene: Delayed capillary refill 2-3 seconds.  No signs of sclerodactyly.   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  No lesions noted on exam.   Chronic right shoulder pain: She has good range of motion of the right shoulder joint on examination today.  She has been using topical magnesium cream for pain relief.   Primary osteoarthritis of both hands - XR of both hands 03/23/21: early osteoarthritic changes. PIP and DIP thickening consistent with osteoarthritis of both hands.  No synovitis noted. Complete fist formation bilaterally.  Discussed the importance of joint protection and muscle  strengthening.   Primary osteoarthritis of both feet - Osteoarthritis involving bilateral first MTP.  Thickening of bilateral 1st and 5th MTP joints.  Dorsal spurring noted bilaterally.  No synovitis or dactylitis noted.  She is wearing proper fitting shoes.  Overall her feet pain and stiffness has improved.  Hypermobility of joint: Followed by Dr. Raeford Razor.   Other fatigue: Stable.   S/P carpal tunnel release - Dr. Erlinda Hong. Asymptomatic at this time.   Chronic left SI joint pain: She has tenderness over the left SI joint.  She has been performing stretching exercises on a regular basis.  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    Dispense:  180 tablet    Refill:  0     Follow-Up Instructions: Return in about 6 months (around 11/24/2022) for Systemic lupus erythematosus.   Ofilia Neas, PA-C  Note - This record has been created using Dragon software.  Chart creation errors have been sought, but may not always  have been located. Such creation errors do not reflect on  the standard of medical care.

## 2022-05-17 ENCOUNTER — Encounter: Payer: Self-pay | Admitting: Allergy & Immunology

## 2022-05-18 ENCOUNTER — Other Ambulatory Visit: Payer: Self-pay | Admitting: Rheumatology

## 2022-05-18 DIAGNOSIS — M3219 Other organ or system involvement in systemic lupus erythematosus: Secondary | ICD-10-CM

## 2022-05-18 DIAGNOSIS — F331 Major depressive disorder, recurrent, moderate: Secondary | ICD-10-CM | POA: Diagnosis not present

## 2022-05-19 ENCOUNTER — Ambulatory Visit: Payer: Self-pay

## 2022-05-19 ENCOUNTER — Ambulatory Visit (INDEPENDENT_AMBULATORY_CARE_PROVIDER_SITE_OTHER): Payer: BC Managed Care – PPO | Admitting: Family Medicine

## 2022-05-19 ENCOUNTER — Encounter: Payer: Self-pay | Admitting: Family Medicine

## 2022-05-19 VITALS — BP 120/60 | Ht 65.0 in | Wt 162.0 lb

## 2022-05-19 DIAGNOSIS — S93602A Unspecified sprain of left foot, initial encounter: Secondary | ICD-10-CM

## 2022-05-19 DIAGNOSIS — M79672 Pain in left foot: Secondary | ICD-10-CM

## 2022-05-19 DIAGNOSIS — M7122 Synovial cyst of popliteal space [Baker], left knee: Secondary | ICD-10-CM

## 2022-05-19 NOTE — Assessment & Plan Note (Addendum)
Has done well since the aspiration. -Counseled on home exercise therapy and supportive care. -Could consider further imaging.

## 2022-05-19 NOTE — Patient Instructions (Signed)
Good to see you  Please try ice as needed  Please try the compression  Please try the exercises  Please send me a message in MyChart with any questions or updates.  Please see me back as needed of 4-6 weeks or no better.   --Dr. Raeford Razor

## 2022-05-19 NOTE — Assessment & Plan Note (Signed)
Acutely occurring with injury last week.  She does have disruption of the spring ligament on exam.  Chronic changes are exacerbated in the lateral aspect of the ankle. -Counseled on home exercise therapy and supportive care. -Midfoot arch strap. -could Consider an orthotic.

## 2022-05-19 NOTE — Progress Notes (Signed)
Robin [name] - 51 y.o. female MRN [id number]  Date of birth: [birth year]  SUBJECTIVE:  Including CC & ROS.  No chief complaint on file.   Robin [name] is a 51 y.o. female that is following up for her left knee pain and presenting with acute left foot pain.  She had an inversion injury last week.  Having pain at the plantar aspect of the foot.   Review of Systems See HPI   HISTORY: Past Medical, Surgical, Social, and Family History Reviewed & Updated per EMR.   Pertinent Historical Findings include:  Past Medical History:  Diagnosis Date   ADD (attention deficit disorder)    Allergy    Anemia    Anorexia nervosa with bulimia    Per [place] New Patient Packet   Anxiety    Asthma    Carpal tunnel syndrome    Per [place] New Patient Packet   Depression    Dry eyes    Per [place] New Patient Packet   Dry mouth    Per [place] New Patient Packet   Fibroids    Per [facility] New Patient Packet   History of hip x-ray 2021   Per [place] New Patient Packet   History of Papanicolaou smear of cervix 2021   Per [place] New Patient Packet, Dr.Pickens   Hyperlipidemia    Ileitis    Lupus ([place])    PONV (postoperative nausea and vomiting)    mild nausea    Raynaud disease    Retinal tear    Per [facility] New Patient Packet   Systemic lupus erythematosus ([place])     Past Surgical History:  Procedure Laterality Date   CARPAL TUNNEL RELEASE Right    08/2019   CESAREAN SECTION  2003   CESAREAN SECTION  2005   Per [facility] New Patient Packet   COLONOSCOPY  2017   Dr.Jacobs   DENTAL SURGERY  2009   Fromed tooth and chronic infection   DILITATION & CURRETTAGE/HYSTROSCOPY WITH NOVASURE ABLATION N/A 06/03/2020   Procedure: DILATATION & CURETTAGE/HYSTEROSCOPY WITH NOVASURE ABLATION;  Surgeon: Aletha Halim, MD;  Location: [facility];  Service: Gynecology;  Laterality: N/A;   ENDOMETRIAL BIOPSY  04/20/2020       EYE SURGERY Bilateral    cryoretinopexy     PHYSICAL EXAM:  VS: BP 120/60 (BP  Location: Left Arm, Patient Position: Sitting)   Ht 5' 5"  (1.651 m)   Wt 162 lb (73.5 kg)   BMI 26.96 kg/m  Physical Exam Gen: NAD, alert, cooperative with exam, well-appearing MSK:  Neurovascularly intact    Limited ultrasound: Left foot:  Normal insertion of the posterior tibialis into the navicular. There appears to be disruption and hyperemia of the spring ligament. No ankle effusion. Chronic changes over the lateral talus and distal fibula. Normal insertion into the peroneal brevis  Summary: Acute partial tear of the spring ligament with chronic changes in the lateral compartment of the ankle  Ultrasound and interpretation by Clearance Coots, MD    ASSESSMENT & PLAN:   Baker's cyst of knee, left Has done well since the aspiration. -Counseled on home exercise therapy and supportive care. -Could consider further imaging.  Foot sprain, left, initial encounter Acutely occurring with injury last week.  She does have disruption of the spring ligament on exam.  Chronic changes are exacerbated in the lateral aspect of the ankle. -Counseled on home exercise therapy and supportive care. -Midfoot arch strap. -could Consider an orthotic.

## 2022-05-26 ENCOUNTER — Encounter: Payer: Self-pay | Admitting: Physician Assistant

## 2022-05-26 ENCOUNTER — Telehealth: Payer: Self-pay

## 2022-05-26 ENCOUNTER — Encounter: Payer: Self-pay | Admitting: Orthopaedic Surgery

## 2022-05-26 ENCOUNTER — Ambulatory Visit (INDEPENDENT_AMBULATORY_CARE_PROVIDER_SITE_OTHER): Payer: BC Managed Care – PPO | Admitting: Orthopaedic Surgery

## 2022-05-26 ENCOUNTER — Ambulatory Visit: Payer: BC Managed Care – PPO | Attending: Physician Assistant | Admitting: Physician Assistant

## 2022-05-26 VITALS — BP 133/83 | HR 83 | Resp 15 | Ht 66.0 in | Wt 161.4 lb

## 2022-05-26 DIAGNOSIS — R5383 Other fatigue: Secondary | ICD-10-CM

## 2022-05-26 DIAGNOSIS — F900 Attention-deficit hyperactivity disorder, predominantly inattentive type: Secondary | ICD-10-CM

## 2022-05-26 DIAGNOSIS — Z8659 Personal history of other mental and behavioral disorders: Secondary | ICD-10-CM

## 2022-05-26 DIAGNOSIS — I7300 Raynaud's syndrome without gangrene: Secondary | ICD-10-CM

## 2022-05-26 DIAGNOSIS — M19072 Primary osteoarthritis, left ankle and foot: Secondary | ICD-10-CM

## 2022-05-26 DIAGNOSIS — G8929 Other chronic pain: Secondary | ICD-10-CM

## 2022-05-26 DIAGNOSIS — M3219 Other organ or system involvement in systemic lupus erythematosus: Secondary | ICD-10-CM

## 2022-05-26 DIAGNOSIS — L52 Erythema nodosum: Secondary | ICD-10-CM | POA: Diagnosis not present

## 2022-05-26 DIAGNOSIS — Z8639 Personal history of other endocrine, nutritional and metabolic disease: Secondary | ICD-10-CM

## 2022-05-26 DIAGNOSIS — M533 Sacrococcygeal disorders, not elsewhere classified: Secondary | ICD-10-CM

## 2022-05-26 DIAGNOSIS — M19042 Primary osteoarthritis, left hand: Secondary | ICD-10-CM

## 2022-05-26 DIAGNOSIS — R202 Paresthesia of skin: Secondary | ICD-10-CM

## 2022-05-26 DIAGNOSIS — Z79899 Other long term (current) drug therapy: Secondary | ICD-10-CM | POA: Diagnosis not present

## 2022-05-26 DIAGNOSIS — M19041 Primary osteoarthritis, right hand: Secondary | ICD-10-CM

## 2022-05-26 DIAGNOSIS — Z8669 Personal history of other diseases of the nervous system and sense organs: Secondary | ICD-10-CM

## 2022-05-26 DIAGNOSIS — K5000 Crohn's disease of small intestine without complications: Secondary | ICD-10-CM

## 2022-05-26 DIAGNOSIS — E559 Vitamin D deficiency, unspecified: Secondary | ICD-10-CM

## 2022-05-26 DIAGNOSIS — M25511 Pain in right shoulder: Secondary | ICD-10-CM

## 2022-05-26 DIAGNOSIS — Z9889 Other specified postprocedural states: Secondary | ICD-10-CM

## 2022-05-26 DIAGNOSIS — M19071 Primary osteoarthritis, right ankle and foot: Secondary | ICD-10-CM

## 2022-05-26 DIAGNOSIS — M249 Joint derangement, unspecified: Secondary | ICD-10-CM

## 2022-05-26 DIAGNOSIS — G5602 Carpal tunnel syndrome, left upper limb: Secondary | ICD-10-CM

## 2022-05-26 MED ORDER — FAMOTIDINE 20 MG PO TABS: 20.0000 mg | ORAL_TABLET | Freq: Two times a day (BID) | ORAL | 1 refills | Status: DC

## 2022-05-26 MED ORDER — MONTELUKAST SODIUM 10 MG PO TABS: 10.0000 mg | ORAL_TABLET | Freq: Two times a day (BID) | ORAL | 1 refills | Status: DC

## 2022-05-26 MED ORDER — HYDROXYCHLOROQUINE SULFATE 200 MG PO TABS: 200.0000 mg | ORAL_TABLET | Freq: Two times a day (BID) | ORAL | 0 refills | Status: DC

## 2022-05-26 NOTE — Telephone Encounter (Signed)
Sent in pepcid and montelukast refills to [facility] in [place]

## 2022-05-26 NOTE — Progress Notes (Signed)
Office Visit Note   Patient: Robin [name]           Date of Birth: Dec 20, 1970           MRN: [id number] Visit Date: 05/26/2022              Requested by: Robin Hughs, NP [address] [place],  [place] [address] PCP: Robin Hughs, NP   Assessment & Plan: Visit Diagnoses:  1. Left carpal tunnel syndrome     Plan: Nerve conduction studies showed moderate to severe left carpal tunnel syndrome.  This is consistent with her worsening symptoms clinically.  Based on these findings and results she has elected to move forward with a left carpal tunnel release sometime in November and December.  Jackelyn Poling will call the patient to schedule.  Questions encouraged and answered.  Risk benefits prognosis reviewed.  Follow-Up Instructions: No follow-ups on file.   Orders:  No orders of the defined types were placed in this encounter.  No orders of the defined types were placed in this encounter.     Procedures: No procedures performed   Clinical Data: No additional findings.   Subjective: Chief Complaint  Patient presents with   Left Wrist - Follow-up    EMG review   Right Wrist - Follow-up    HPI [name] returns today to discuss nerve conduction studies. Review of Systems   Objective: Vital Signs: There were no vitals taken for this visit.  Physical Exam  Ortho Exam Lamination of left knee is unchanged. Specialty Comments:  No specialty comments available.  Imaging: No results found.   PMFS History: Patient Active Problem List   Diagnosis Date Noted   Left carpal tunnel syndrome 05/26/2022   Foot sprain, left, initial encounter 05/19/2022   Baker's cyst of knee, left 04/21/2022   Right carpal tunnel syndrome 03/16/2022   Flushing 04/14/2021   Hypermobility syndrome 11/27/2020   Seasonal and perennial allergic rhinitis 11/06/2018   Mild intermittent asthma without complication 03/47/4259   Flexural atopic dermatitis 11/06/2018   History of  hyperlipidemia 11/08/2016   History of anxiety 11/08/2016   History of retinal tear 11/08/2016   History of Bilateral plantar fasciitis 08/10/2016   High risk medication use 07/04/2016   Lupus ([place]) 05/23/2016   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[place]) 03/18/2016   ADHD (attention deficit hyperactivity disorder) 12/22/2014   Raynaud's phenomenon 10/17/2013   Hyperlipidemia 09/28/2011   Past Medical History:  Diagnosis Date   ADD (attention deficit disorder)    Allergy    Anemia    Anorexia nervosa with bulimia    Per [place] [facility] Patient Packet   Anxiety    Asthma    Carpal tunnel syndrome    Per [place] New Patient Packet   Depression    Dry eyes    Per [place] [facility] Patient Packet   Dry mouth    Per [place] New Patient Packet   Fibroids    Per [place] [facility] Patient Packet   History of hip x-ray 2021   Per [place] New Patient Packet   History of Papanicolaou smear of cervix 2021   Per [place] New Patient Packet, Dr.Pickens   Hyperlipidemia    Ileitis    Lupus ([place])    PONV (postoperative nausea and vomiting)    mild nausea    Raynaud disease    Retinal tear    Per [facility] New Patient Packet   Systemic lupus erythematosus ([place])     Family History  Problem  Relation Age of Onset   Heart disease Mother 3       CAD/stenting   Hyperlipidemia Mother    Arthritis Mother        ostearthritis   Hypertension Mother    Kidney disease Mother    Irritable bowel syndrome Mother    Stroke Mother 26       CVA   Fibromyalgia Mother    Eczema Mother    Sjogren's syndrome Mother    Hyperlipidemia Father    Neuropathy Father    Allergies Father        shellfish   Prostatitis Father    Hyperlipidemia Sister    ADD / ADHD Sister    Bipolar disorder Sister    Depression Sister    Hypertension Brother    OCD Brother    Anxiety disorder Daughter    ADD / ADHD Daughter    Asthma Daughter    OCD Son    Colon cancer Neg Hx    Inflammatory bowel disease Neg Hx    Allergic rhinitis Neg Hx    Angioedema Neg Hx    Atopy Neg  Hx    Immunodeficiency Neg Hx    Urticaria Neg Hx     Past Surgical History:  Procedure Laterality Date   CARPAL TUNNEL RELEASE Right    08/2019   CESAREAN SECTION  2003   CESAREAN SECTION  2005   Per [facility] New Patient Packet   COLONOSCOPY  2017   Dr.Jacobs   cyst drained Left 2023   knee   DENTAL SURGERY  2009   Fromed tooth and chronic infection   DILITATION & CURRETTAGE/HYSTROSCOPY WITH NOVASURE ABLATION N/A 06/03/2020   Procedure: DILATATION & CURETTAGE/HYSTEROSCOPY WITH NOVASURE ABLATION;  Surgeon: Aletha Halim, MD;  Location: [place];  Service: Gynecology;  Laterality: N/A;   ENDOMETRIAL BIOPSY  04/20/2020       EYE SURGERY Bilateral    cryoretinopexy   Social History   Occupational History   Not on file  Tobacco Use   Smoking status: Former    Packs/day: 0.75    Years: 10.00    Total pack years: 7.50    Types: Cigarettes    Quit date: 07/05/2001    Years since quitting: 20.9    Passive exposure: Never   Smokeless tobacco: Never  Vaping Use   Vaping Use: Never used  Substance and Sexual Activity   Alcohol use: Yes    Comment: occ   Drug use: Never   Sexual activity: Yes    Birth control/protection: Condom

## 2022-05-26 NOTE — Telephone Encounter (Signed)
Patient called to request for a refill on her Singulair and famotidine. She is out of town and wants it sent to a different pharmacy.  [place]

## 2022-05-26 NOTE — Addendum Note (Signed)
Addended by: Felipa Emory on: 05/26/2022 04:32 PM   Modules accepted: Orders

## 2022-05-27 LAB — COMPLETE METABOLIC PANEL WITH GFR
AG Ratio: 1.7 (calc) (ref 1.0–2.5)
ALT: 18 U/L (ref 6–29)
AST: 19 U/L (ref 10–35)
Albumin: 4.7 g/dL (ref 3.6–5.1)
Alkaline phosphatase (APISO): 33 U/L (ref 31–125)
BUN/Creatinine Ratio: 17 (calc) (ref 6–22)
BUN: 18 mg/dL (ref 7–25)
CO2: 29 mmol/L (ref 20–32)
Calcium: 9.8 mg/dL (ref 8.6–10.2)
Chloride: 100 mmol/L (ref 98–110)
Creat: 1.03 mg/dL — ABNORMAL HIGH (ref 0.50–0.99)
Globulin: 2.7 g/dL (calc) (ref 1.9–3.7)
Glucose, Bld: 68 mg/dL (ref 65–99)
Potassium: 4.4 mmol/L (ref 3.5–5.3)
Sodium: 137 mmol/L (ref 135–146)
Total Bilirubin: 0.5 mg/dL (ref 0.2–1.2)
Total Protein: 7.4 g/dL (ref 6.1–8.1)
eGFR: 67 mL/min/{1.73_m2} (ref >=60)

## 2022-05-27 LAB — CBC WITH DIFFERENTIAL/PLATELET
Absolute Monocytes: 441 cells/uL (ref 200–950)
Basophils Absolute: 20 cells/uL (ref 0–200)
Basophils Relative: 0.5 %
Eosinophils Absolute: 59 cells/uL (ref 15–500)
Eosinophils Relative: 1.5 %
HCT: 44.3 % (ref 35.0–45.0)
Hemoglobin: 14.5 g/dL (ref 11.7–15.5)
Lymphs Abs: 1478 cells/uL (ref 850–3900)
MCH: 32 pg (ref 27.0–33.0)
MCHC: 32.7 g/dL (ref 32.0–36.0)
MCV: 97.8 fL (ref 80.0–100.0)
MPV: 9.6 fL (ref 7.5–12.5)
Monocytes Relative: 11.3 %
Neutro Abs: 1903 cells/uL (ref 1500–7800)
Neutrophils Relative %: 48.8 %
Platelets: 311 10*3/uL (ref 140–400)
RBC: 4.53 10*6/uL (ref 3.80–5.10)
RDW: 13 % (ref 11.0–15.0)
Total Lymphocyte: 37.9 %
WBC: 3.9 10*3/uL (ref 3.8–10.8)

## 2022-05-27 LAB — PROTEIN / CREATININE RATIO, URINE
Creatinine, Urine: 29 mg/dL (ref 20–275)
Total Protein, Urine: <4 mg/dL — ABNORMAL LOW (ref 5–24)

## 2022-05-27 LAB — SEDIMENTATION RATE: Sed Rate: 6 mm/h (ref 0–20)

## 2022-05-27 LAB — C3 AND C4
C3 Complement: 102 mg/dL (ref 83–193)
C4 Complement: 26 mg/dL (ref 15–57)

## 2022-05-27 LAB — ANTI-DNA ANTIBODY, DOUBLE-STRANDED: ds DNA Ab: 1 IU/mL

## 2022-05-27 NOTE — Progress Notes (Signed)
Creatinine is borderline elevated-1.03.  rest of CMP WNL.  Limit use of NSAIDs and remain hydrated.  CBC WNL. ESR WNL.   Complements WNL.  No proteinuria.

## 2022-05-29 NOTE — Progress Notes (Signed)
dsDNA is negative.  Labs are not consistent with a flare.

## 2022-06-01 DIAGNOSIS — F331 Major depressive disorder, recurrent, moderate: Secondary | ICD-10-CM | POA: Diagnosis not present

## 2022-06-13 ENCOUNTER — Encounter: Payer: Self-pay | Admitting: Orthopaedic Surgery

## 2022-06-13 ENCOUNTER — Encounter: Payer: Self-pay | Admitting: Family Medicine

## 2022-06-15 DIAGNOSIS — F331 Major depressive disorder, recurrent, moderate: Secondary | ICD-10-CM | POA: Diagnosis not present

## 2022-06-20 ENCOUNTER — Ambulatory Visit: Payer: BC Managed Care – PPO | Admitting: Family Medicine

## 2022-06-20 DIAGNOSIS — F411 Generalized anxiety disorder: Secondary | ICD-10-CM | POA: Diagnosis not present

## 2022-06-20 DIAGNOSIS — F339 Major depressive disorder, recurrent, unspecified: Secondary | ICD-10-CM | POA: Diagnosis not present

## 2022-06-20 DIAGNOSIS — F902 Attention-deficit hyperactivity disorder, combined type: Secondary | ICD-10-CM | POA: Diagnosis not present

## 2022-06-21 ENCOUNTER — Ambulatory Visit: Payer: BC Managed Care – PPO | Admitting: Family Medicine

## 2022-06-21 ENCOUNTER — Ambulatory Visit: Payer: Self-pay

## 2022-06-21 ENCOUNTER — Encounter: Payer: Self-pay | Admitting: Family Medicine

## 2022-06-21 VITALS — BP 124/78 | Ht 66.0 in | Wt 161.0 lb

## 2022-06-21 DIAGNOSIS — M24111 Other articular cartilage disorders, right shoulder: Secondary | ICD-10-CM | POA: Diagnosis not present

## 2022-06-21 DIAGNOSIS — M25511 Pain in right shoulder: Secondary | ICD-10-CM

## 2022-06-21 NOTE — Patient Instructions (Signed)
Good to see you Please use heat as needed  Please consider compression   Please send me a message in MyChart with any questions or updates.  Please see me back if you want to try an injection.   --Dr. Raeford Razor

## 2022-06-21 NOTE — Progress Notes (Signed)
  Robin [name] - 51 y.o. female MRN [id number]  Date of birth: 1971/03/16  SUBJECTIVE:  Including CC & ROS.  No chief complaint on file.   Robin [name] is a 51 y.o. female that is presenting with acute right shoulder pain.  Has pain with reaching out.  Pain is acute on chronic in nature.  No history of surgery or injury.    Review of Systems See HPI   HISTORY: Past Medical, Surgical, Social, and Family History Reviewed & Updated per EMR.   Pertinent Historical Findings include:  Past Medical History:  Diagnosis Date   ADD (attention deficit disorder)    Allergy    Anemia    Anorexia nervosa with bulimia    Per [place] New Patient Packet   Anxiety    Asthma    Carpal tunnel syndrome    Per [place] New Patient Packet   Depression    Dry eyes    Per [place] New Patient Packet   Dry mouth    Per [place] New Patient Packet   Fibroids    Per [facility] New Patient Packet   History of hip x-ray 2021   Per [place] New Patient Packet   History of Papanicolaou smear of cervix 2021   Per [place] New Patient Packet, Dr.Pickens   Hyperlipidemia    Ileitis    Lupus ([place])    PONV (postoperative nausea and vomiting)    mild nausea    Raynaud disease    Retinal tear    Per [facility] New Patient Packet   Systemic lupus erythematosus ([place])     Past Surgical History:  Procedure Laterality Date   CARPAL TUNNEL RELEASE Right    08/2019   CESAREAN SECTION  2003   CESAREAN SECTION  2005   Per [facility] New Patient Packet   COLONOSCOPY  2017   Dr.Jacobs   cyst drained Left 2023   knee   DENTAL SURGERY  2009   Fromed tooth and chronic infection   DILITATION & CURRETTAGE/HYSTROSCOPY WITH NOVASURE ABLATION N/A 06/03/2020   Procedure: DILATATION & CURETTAGE/HYSTEROSCOPY WITH NOVASURE ABLATION;  Surgeon: Aletha Halim, MD;  Location: [place];  Service: Gynecology;  Laterality: N/A;   ENDOMETRIAL BIOPSY  04/20/2020       EYE SURGERY Bilateral    cryoretinopexy     PHYSICAL EXAM:  VS: BP  124/78 (BP Location: Left Arm, Patient Position: Sitting)   Ht 5' 6"  (1.676 m)   Wt 161 lb (73 kg)   BMI 25.99 kg/m  Physical Exam Gen: NAD, alert, cooperative with exam, well-appearing MSK:  Neurovascularly intact    Limited ultrasound: Right shoulder:  Effusion noted within the bicipital tendon sheath as well as multilobulated cyst at the proximal portion of the biceps tendon. Normal-appearing subscapularis and supraspinatus. Effusion noted within the posterior glenohumeral joint.  Summary: Changes most consistent with a degenerative labrum.  Ultrasound and interpretation by Clearance Coots, MD    ASSESSMENT & PLAN:   Labral tear of shoulder, degenerative, right Acutely occurring. Has an effusion and cyst formation around the joint.  - counseled on home exercise therapy and supportive care - counseled on compression  - could consider injection.

## 2022-06-21 NOTE — Assessment & Plan Note (Signed)
Acutely occurring. Has an effusion and cyst formation around the joint.  - counseled on home exercise therapy and supportive care - counseled on compression  - could consider injection.

## 2022-06-28 NOTE — Telephone Encounter (Signed)
Robin [name], pls enter order for an abd/pelvic CT with contrast. Pls send patient to our lab 2 to 3 days before CT to check BMP to ensure her kidneys are ok to for IV contrast. THX.

## 2022-06-29 ENCOUNTER — Other Ambulatory Visit: Payer: Self-pay

## 2022-06-29 ENCOUNTER — Other Ambulatory Visit (INDEPENDENT_AMBULATORY_CARE_PROVIDER_SITE_OTHER): Payer: BC Managed Care – PPO

## 2022-06-29 DIAGNOSIS — R109 Unspecified abdominal pain: Secondary | ICD-10-CM

## 2022-06-29 DIAGNOSIS — K5900 Constipation, unspecified: Secondary | ICD-10-CM

## 2022-06-29 DIAGNOSIS — R140 Abdominal distension (gaseous): Secondary | ICD-10-CM

## 2022-06-29 DIAGNOSIS — F331 Major depressive disorder, recurrent, moderate: Secondary | ICD-10-CM | POA: Diagnosis not present

## 2022-06-29 LAB — BASIC METABOLIC PANEL
BUN: 18 mg/dL (ref 6–23)
CO2: 30 mEq/L (ref 19–32)
Calcium: 9.4 mg/dL (ref 8.4–10.5)
Chloride: 99 mEq/L (ref 96–112)
Creatinine, Ser: 0.88 mg/dL (ref 0.40–1.20)
GFR: 77.26 mL/min (ref >60.00)
Glucose, Bld: 83 mg/dL (ref 70–99)
Potassium: 3.8 mEq/L (ref 3.5–5.1)
Sodium: 135 mEq/L (ref 135–145)

## 2022-07-01 ENCOUNTER — Other Ambulatory Visit: Payer: Self-pay | Admitting: Nurse Practitioner

## 2022-07-06 ENCOUNTER — Ambulatory Visit (HOSPITAL_COMMUNITY): Payer: BC Managed Care – PPO

## 2022-07-06 ENCOUNTER — Other Ambulatory Visit: Payer: Self-pay | Admitting: *Deleted

## 2022-07-06 DIAGNOSIS — M24111 Other articular cartilage disorders, right shoulder: Secondary | ICD-10-CM

## 2022-07-06 DIAGNOSIS — M7122 Synovial cyst of popliteal space [Baker], left knee: Secondary | ICD-10-CM

## 2022-07-14 ENCOUNTER — Ambulatory Visit (INDEPENDENT_AMBULATORY_CARE_PROVIDER_SITE_OTHER): Payer: BC Managed Care – PPO | Admitting: Allergy & Immunology

## 2022-07-14 ENCOUNTER — Encounter: Payer: Self-pay | Admitting: Allergy & Immunology

## 2022-07-14 VITALS — BP 138/88 | HR 73 | Temp 97.2°F | Resp 16 | Ht 66.0 in | Wt 158.8 lb

## 2022-07-14 DIAGNOSIS — G901 Familial dysautonomia [Riley-Day]: Secondary | ICD-10-CM | POA: Diagnosis not present

## 2022-07-14 DIAGNOSIS — J4520 Mild intermittent asthma, uncomplicated: Secondary | ICD-10-CM

## 2022-07-14 DIAGNOSIS — D8940 Mast cell activation, unspecified: Secondary | ICD-10-CM

## 2022-07-14 DIAGNOSIS — J3089 Other allergic rhinitis: Secondary | ICD-10-CM

## 2022-07-14 DIAGNOSIS — R21 Rash and other nonspecific skin eruption: Secondary | ICD-10-CM

## 2022-07-14 DIAGNOSIS — J302 Other seasonal allergic rhinitis: Secondary | ICD-10-CM

## 2022-07-14 DIAGNOSIS — F331 Major depressive disorder, recurrent, moderate: Secondary | ICD-10-CM | POA: Diagnosis not present

## 2022-07-14 MED ORDER — FAMOTIDINE 20 MG PO TABS: 20.0000 mg | ORAL_TABLET | Freq: Two times a day (BID) | ORAL | 1 refills | Status: DC

## 2022-07-14 MED ORDER — MONTELUKAST SODIUM 10 MG PO TABS: 10.0000 mg | ORAL_TABLET | Freq: Two times a day (BID) | ORAL | 1 refills | Status: DC

## 2022-07-14 NOTE — Patient Instructions (Addendum)
1. Facial rash - Start prednisone dose pack provided. - Start Eucrisa 3-4 times daily for the next week or so (put in the fridge to help with any burning sensations). Georga Hacking is a non-steroidal ointment that will not cause the steroid side effects to the skin.   2. Mild intermittent asthma, uncomplicated - Lung testing looked good today.  - We will not make any medication changes today. - Continue with albuterol every 4 hours as needed.    3. Seasonal and perennial allergic rhinitis (grasses, indoor molds, outdoor molds, dust mites, cat and cockroach) - Continue with an over the counter antihistamine daily.  - Continue with Singulair twice daily as you are doing.  - Continue with Astelin as needed.    4. Flexural atopic dermatitis - Continue with moisturizing twice daily as needed.  5. 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 Esperanza Sheets.  - Continue with naltrexone 18m daily as you are doing. - Continue with loratadine 266mdaily. - Continue with diphenhydramine 50109maily. - Continue with famotidine 40m67mily.  - Thanks for teaching me so much!  5. Return in about 4 months (around 11/12/2022).    Please inform us o[place]oreaany Emergency Department visits, hospitalizations, or changes in symptoms. Call us bKoreaore going to the ED for breathing or allergy symptoms since we might be able to fit you in for a sick visit. Feel free to contact us aKoreatime with any questions, problems, or concerns.  It was a pleasure to see you and your family again today!  Websites that have reliable patient information: 1. American Academy of Asthma, Allergy, and Immunology: www.aaaai.org 2. Food Allergy Research and Education (FARE): foodallergy.org 3. Mothers of Asthmatics: http://www.asthmacommunitynetwork.org 4. American College of Allergy, Asthma, and Immunology: www.acaai.org   COVID-19 Vaccine Information can be found at:  [email or web address] questions related to vaccine distribution or appointments, please email vaccine@ [email or web address] or call [phone]  "Like" us oKoreaFacebook and Instagram for our latest updates!       Make sure you are registered to vote! If you have moved or changed any of your contact information, you will need to get this updated before voting!  In some cases, you MAY be able to register to vote online: [email or web address]

## 2022-07-14 NOTE — Progress Notes (Signed)
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 Facial rash - Start prednisone dose pack provided. - Start Eucrisa 3-4 times daily for the next week or so (put in the fridge to help with any burning sensations). Robin [name] is a non-steroidal ointment that will not cause the steroid side effects to the skin.   2. Mild intermittent asthma, uncomplicated - Lung testing looked good today.  - We will not make any medication changes today. - Continue with albuterol every 4 hours as needed.    3. Seasonal and perennial allergic rhinitis (grasses, indoor molds, outdoor molds, dust mites, cat and cockroach) - Continue with an over the counter antihistamine daily.  - Continue with Singulair twice daily as you are doing.  - Continue with Astelin as needed.    4. Flexural atopic dermatitis - Continue with moisturizing twice daily as needed.  5. 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  - Continue with naltrexone 9m daily as you are doing. - Continue with loratadine 261mdaily. - Continue with diphenhydramine 5089maily. - Continue with famotidine 58m37mily.  - Thanks for teaching me so much!  5. Return in about 4 months (around 11/12/2022).     Subjective:   Robin Robin [name] 51 y73. female presenting today for follow up of  Chief Complaint  Patient presents with   Rash    Face, neck, behind the ears x Tuesday - very itchy, bumpy.  Patient states she has started new face wash on Saturday; Resolve cleaner x  Tuesday and Covid vaccine [employer]Friday.    Robin [name] a history of the following: Patient Active Problem List   Diagnosis Date Noted   Left hamstring injury 07/15/2022   Labral tear of shoulder, degenerative, right 06/21/2022   Left carpal tunnel syndrome 05/26/2022   Foot sprain, left, initial encounter 05/19/2022   Baker's cyst of knee, left 04/21/2022   Right carpal tunnel syndrome 03/16/2022   Flushing 04/14/2021   Hypermobility syndrome 11/27/2020   Seasonal and perennial allergic rhinitis 11/06/2018   Mild intermittent asthma without complication 02/282/42/3536lexural atopic dermatitis 11/06/2018   History of hyperlipidemia 11/08/2016   History of anxiety 11/08/2016   History of retinal tear 11/08/2016   History of Bilateral plantar fasciitis 08/10/2016   High risk medication use 07/04/2016   Lupus ([facility])[place]/07/2016   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  Robin [name] 51 y65. female presenting for a sick visit.  She was last seen in July 2023.  At that time, lung testing looked good.  We continue with albuterol as needed.  For her allergic rhinitis, she continue with Singulair as well as Astelin.  For her concern for MCAS, we referred her to see a specialist in [place] then referred her instead [facility].  We started her on cromolyn 200 mg 4 times  a day and continue with Singulair twice daily.  She did start reacting to her cromolyn.  She felt that the pain in her feet and legs and connective tissue pain were better while on the cromolyn, but she was having a worsening rash.  This improved when she stopped the cromolyn. Lots of her symptoms are pain in her feet and swelling in her feet. She had improved pain in her legs, but she was still reactive to certain things.   She touched her  face with the cleaner. She also used some vinegar. She started a new face wash on Friday and Saturday. She seemed to do fine with this. She used it again two more times between Saturday and Tuesday. Then on Tuesday during the day, her cat peed and she used Resolve cleaner. She started itching on her neck around Tuesday afternoon and this worsened over the course of the evening. She took all of her winter stuff and washed it. The rash was well demarcated where the clothing touched her skin. She does not think that her current flare is related to a food since this is isolated only to her face.   She did drink a new drink at [employer]. She has been avoiding gluten and avoiding things that "look sketchy". Normal she has more systemic symptoms when she has a food induced flare. This is much more isolated, which is why she does not think that this is related to any foods at all.   She has been using Aveeno cortisone that she keeps in the fridge. This is the only stuff that she can use. Asthma has been well controlled. She is not using her rescue inhaler much at all.   She went on low dose naltrexone which has been working. She is on 33m once daily. She started on 434mand then went up to 78m70mThe max dose is 70m72mr this indication. She is getting it from a specialty pharmacy. It is around $50. She feels that she has lost a lot of water weight with this naltrexone. She does have an appointment with Dr. IweaEsperanza SheetsFebruary. [place] Dr. DeveEstanislado Pandyry 9 months. She is on Plaquenil for the lupus.    Otherwise, there have been no changes to her past medical history, surgical history, family history, or social history.    Review of Systems  Constitutional: Negative.  Negative for chills, fever, malaise/fatigue and weight loss.  HENT: Negative.  Negative for congestion, ear discharge, ear pain and sinus pain.   Eyes:  Negative  for pain, discharge and redness.  Respiratory:  Negative for cough, sputum production, shortness of breath and wheezing.   Cardiovascular: Negative.  Negative for chest pain and palpitations.  Gastrointestinal:  Negative for abdominal pain, constipation, diarrhea, heartburn, nausea and vomiting.  Skin:  Positive for itching and rash.  Neurological:  Negative for dizziness and headaches.  Endo/Heme/Allergies:  Negative for environmental allergies. Does not bruise/bleed easily.       Objective:   Blood pressure 138/88, pulse 73, temperature (!) 97.2 F (36.2 C), resp. rate 16, height _0  (1.676 m), weight 158 lb 12.8 oz (72 kg), SpO2 99 %. Body mass index is 25.63 kg/m.    Physical Exam Vitals reviewed.  Constitutional:      Appearance: She is well-developed.     Comments: Very friendly. Talkative.   HENT:     Head:  Normocephalic and atraumatic.     Right Ear: Tympanic membrane, ear canal and external ear normal.     Left Ear: Tympanic membrane, ear canal and external ear normal.     Nose: No nasal deformity, septal deviation, mucosal edema or rhinorrhea.     Right Turbinates: Enlarged, swollen and pale.     Left Turbinates: Enlarged, swollen and pale.     Right Sinus: No maxillary sinus tenderness or frontal sinus tenderness.     Left Sinus: No maxillary sinus tenderness or frontal sinus tenderness.     Mouth/Throat:     Lips: Pink.     Mouth: Mucous membranes are moist. Mucous membranes are not pale and not dry.     Pharynx: Uvula midline.  Eyes:     General: Allergic shiner present.        Right eye: No discharge.        Left eye: No discharge.     Conjunctiva/sclera: Conjunctivae normal.     Right eye: Right conjunctiva is not injected. No chemosis.    Left eye: Left conjunctiva is not injected. No chemosis.    Pupils: Pupils are equal, round, and reactive to light.  Cardiovascular:     Rate and Rhythm: Normal rate and regular rhythm.     Heart sounds: Normal heart  sounds.  Pulmonary:     Effort: Pulmonary effort is normal. No tachypnea, accessory muscle usage or respiratory distress.     Breath sounds: Normal breath sounds. No wheezing, rhonchi or rales.     Comments: Moving air well in all lung fields. Chest:     Chest wall: No tenderness.  Lymphadenopathy:     Cervical: No cervical adenopathy.  Skin:    Coloration: Skin is not pale.     Findings: Rash present. No abrasion, erythema or petechiae. Rash is papular. Rash is not macular, urticarial or vesicular.     Comments: She does have some papular lesions on her face with some confluent erythema. She has some excoriation marks on her neck.   Neurological:     Mental Status: She is alert.  Psychiatric:        Behavior: Behavior is cooperative.      Diagnostic studies: none        Salvatore Marvel, MD  [facility] [place] [facility] [place]

## 2022-07-15 ENCOUNTER — Ambulatory Visit: Payer: BC Managed Care – PPO | Admitting: Orthopaedic Surgery

## 2022-07-15 DIAGNOSIS — S76302A Unspecified injury of muscle, fascia and tendon of the posterior muscle group at thigh level, left thigh, initial encounter: Secondary | ICD-10-CM | POA: Insufficient documentation

## 2022-07-15 DIAGNOSIS — M24111 Other articular cartilage disorders, right shoulder: Secondary | ICD-10-CM | POA: Diagnosis not present

## 2022-07-15 NOTE — Progress Notes (Signed)
Office Visit Note   Patient: Robin [name]           Date of Birth: [birth year]           MRN: [id number] Visit Date: 07/15/2022              Requested by: Robin [name] [address] [place],  [name] PCP: Robin [name]   Assessment & Plan: Visit Diagnoses:  1. Left hamstring injury, initial encounter   2. Labral tear of shoulder, degenerative, right     Plan: Impression is mid substance grade 1 left hamstring tear.  Disease process and treatment explained.  We will send her to physical therapy.  She can add some compression with an Ace bandage.  In regards to the shoulder she has not felt relief from conservative management therefore we will order MR arthrogram.  Follow-up after the MRI.  She also requested a referral to Robin [name] for ultrasound-guided aspiration of the Baker's cyst which she has done in the past and has gotten relief.  Follow-Up Instructions: No follow-ups on file.   Orders:  Orders Placed This Encounter  Procedures   Ambulatory referral to Physical Therapy   No orders of the defined types were placed in this encounter.     Procedures: No procedures performed   Clinical Data: No additional findings.   Subjective: Chief Complaint  Patient presents with   Left Leg - Pain    HPI [name] comes in today for left thigh injury and evaluation of chronic right shoulder pain.  In regards to the thigh injury, last weekend her dog ran into her knee and she felt a pop in the back of the thigh.  Standing straight is painful and she feels tightness.  KT tape helps a little bit.  Feels that there is weakness with using steps.  She also is asking to be evaluated for chronic right shoulder pain.  She has seen Robin [name] for this who performed an ultrasound and felt that she had a labral tear.  She has done some home exercises and has not felt any improvement.  Review of Systems  Constitutional: Negative.   HENT: Negative.    Eyes:  Negative.   Respiratory: Negative.    Cardiovascular: Negative.   Endocrine: Negative.   Musculoskeletal: Negative.   Neurological: Negative.   Hematological: Negative.   Psychiatric/Behavioral: Negative.    All other systems reviewed and are negative.    Objective: Vital Signs: There were no vitals taken for this visit.  Physical Exam Vitals and nursing note reviewed.  Constitutional:      Appearance: She is well-developed.  Pulmonary:     Effort: Pulmonary effort is normal.  Skin:    General: Skin is warm.     Capillary Refill: Capillary refill takes less than 2 seconds.  Neurological:     Mental Status: She is alert and oriented to person, place, and time.  Psychiatric:        Behavior: Behavior normal.        Thought Content: Thought content normal.        Judgment: Judgment normal.     Ortho Exam Examination of the left thigh shows a small amount of bruising towards the distal thigh.  Palpable Baker's cyst.  She has decent strength with slight discomfort with hamstring contraction.  She has tenderness to the mid substance of the semi-T and semimembranosus portion of the hamstring  Examination of the right shoulder  is unchanged. Specialty Comments:  No specialty comments available.  Imaging: No results found.   PMFS History: Patient Active Problem List   Diagnosis Date Noted   Left hamstring injury 07/15/2022   Labral tear of shoulder, degenerative, right 06/21/2022   Left carpal tunnel syndrome 05/26/2022   Foot sprain, left, initial encounter 05/19/2022   Baker's cyst of knee, left 04/21/2022   Right carpal tunnel syndrome 03/16/2022   Flushing 04/14/2021   Hypermobility syndrome 11/27/2020   Seasonal and perennial allergic rhinitis 11/06/2018   Mild intermittent asthma without complication 16/06/9603   Flexural atopic dermatitis 11/06/2018   History of hyperlipidemia 11/08/2016   History of anxiety 11/08/2016   History of retinal tear 11/08/2016    History of Bilateral plantar fasciitis 08/10/2016   High risk medication use 07/04/2016   Lupus ([place]) 05/23/2016   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[place]) 03/18/2016   ADHD (attention deficit hyperactivity disorder) 12/22/2014   Raynaud's phenomenon 10/17/2013   Hyperlipidemia 09/28/2011   Past Medical History:  Diagnosis Date   ADD (attention deficit disorder)    Allergy    Anemia    Anorexia nervosa with bulimia    Per [place] New Patient Packet   Anxiety    Asthma    Carpal tunnel syndrome    Per [place] New Patient Packet   Depression    Dry eyes    Per [place] New Patient Packet   Dry mouth    Per [place] New Patient Packet   Fibroids    Per [place] New Patient Packet   History of hip x-ray 2021   Per [place] New Patient Packet   History of Papanicolaou smear of cervix 2021   Per [place] New Patient Packet, RobinPickens   Hyperlipidemia    Ileitis    Lupus ([place])    PONV (postoperative nausea and vomiting)    mild nausea    Raynaud disease    Retinal tear    Per [place] New Patient Packet   Systemic lupus erythematosus ([place])     Family History  Problem Relation Age of Onset   Heart disease Mother 77       CAD/stenting   Hyperlipidemia Mother    Arthritis Mother        ostearthritis   Hypertension Mother    Kidney disease Mother    Irritable bowel syndrome Mother    Stroke Mother 35       CVA   Fibromyalgia Mother    Eczema Mother    Sjogren's syndrome Mother    Hyperlipidemia Father    Neuropathy Father    Allergies Father        shellfish   Prostatitis Father    Hyperlipidemia Sister    ADD / ADHD Sister    Bipolar disorder Sister    Depression Sister    Hypertension Brother    OCD Brother    Anxiety disorder Daughter    ADD / ADHD Daughter    Asthma Daughter    OCD Son    Colon cancer Neg Hx    Inflammatory bowel disease Neg Hx    Allergic rhinitis Neg Hx    Angioedema Neg Hx    Atopy Neg Hx    Immunodeficiency Neg Hx    Urticaria Neg Hx     Past Surgical History:  Procedure  Laterality Date   CARPAL TUNNEL RELEASE Right    08/2019   CESAREAN SECTION  2003   CESAREAN SECTION  2005   Per [facility] New Patient Packet  COLONOSCOPY  2017   RobinJacobs   cyst drained Left 2023   knee   DENTAL SURGERY  2009   Fromed tooth and chronic infection   DILITATION & CURRETTAGE/HYSTROSCOPY WITH NOVASURE ABLATION N/A 06/03/2020   Procedure: DILATATION & CURETTAGE/HYSTEROSCOPY WITH NOVASURE ABLATION;  Surgeon: Aletha Halim, MD;  Location: [facility];  Service: Gynecology;  Laterality: N/A;   ENDOMETRIAL BIOPSY  04/20/2020       EYE SURGERY Bilateral    cryoretinopexy   Social History   Occupational History   Not on file  Tobacco Use   Smoking status: Former    Packs/day: 0.75    Years: 10.00    Total pack years: 7.50    Types: Cigarettes    Quit date: 07/05/2001    Years since quitting: 21.0    Passive exposure: Never   Smokeless tobacco: Never  Vaping Use   Vaping Use: Never used  Substance and Sexual Activity   Alcohol use: Yes    Comment: occ   Drug use: Never   Sexual activity: Yes    Birth control/protection: Condom

## 2022-07-15 NOTE — Telephone Encounter (Signed)
Pt was contacted in regard to her My Chart message: Pt was notified to contact [place] in regard to the questions that she has about her CT contrast: Pt was notified that I would forward her message on to our authorization team:    Please see note below and please ensure that Pt CT scan is authorized for the new date that was given once rescheduled: Pt states that she has the same insurance and nothing has changed:

## 2022-07-16 ENCOUNTER — Encounter: Payer: Self-pay | Admitting: Allergy & Immunology

## 2022-07-19 ENCOUNTER — Ambulatory Visit: Payer: BC Managed Care – PPO | Admitting: Sports Medicine

## 2022-07-19 ENCOUNTER — Encounter: Payer: Self-pay | Admitting: Sports Medicine

## 2022-07-19 ENCOUNTER — Ambulatory Visit: Payer: Self-pay

## 2022-07-19 DIAGNOSIS — M7122 Synovial cyst of popliteal space [Baker], left knee: Secondary | ICD-10-CM | POA: Diagnosis not present

## 2022-07-19 DIAGNOSIS — S76302A Unspecified injury of muscle, fascia and tendon of the posterior muscle group at thigh level, left thigh, initial encounter: Secondary | ICD-10-CM

## 2022-07-19 MED ORDER — METHYLPREDNISOLONE ACETATE 40 MG/ML IJ SUSP
40.0000 mg | INTRAMUSCULAR | Status: AC | PRN
  Administered 2022-07-19: 40 mg via INTRA_ARTICULAR

## 2022-07-19 MED ORDER — BUPIVACAINE HCL 0.25 % IJ SOLN
1.0000 mL | INTRAMUSCULAR | Status: AC | PRN
  Administered 2022-07-19: 1 mL via INTRA_ARTICULAR

## 2022-07-19 MED ORDER — LIDOCAINE HCL 1 % IJ SOLN
2.0000 mL | INTRAMUSCULAR | Status: AC | PRN
  Administered 2022-07-19: 2 mL

## 2022-07-19 NOTE — Progress Notes (Signed)
   Procedure Note  Patient: GENIFER LAZENBY             Date of Birth: 03/22/1971           MRN: [id number]             Visit Date: 07/19/2022  Procedures: Visit Diagnoses:  1. Baker's cyst of knee, left   2. Left hamstring injury, initial encounter    [facility] Limited knee ultrasound performed, left posterior knee: - Ultrasound demonstrates a large hypoechoic structure within the popliteal fossa with septation. This was measured 3.03 x 1.59 cm in short axis and 5 cm x 1.2 cm in long axis.  This does appear to reflect a Baker's cyst as there is a neck of the cyst that appears to extend into the posterior knee joint.  No cortical irregularity of the medial femoral condyle on limited view.  No vascular Doppler uptake within the fluid collection.        Large Joint Inj: L knee on 07/19/2022 11:38 AM Indications: pain and joint swelling Details: 22 G 1.5 in needle, ultrasound-guided posterior approach Medications: 2 mL lidocaine 1 %; 1 mL bupivacaine 0.25 %; 40 mg methylPREDNISolone acetate 40 MG/ML Aspirate: 13 mL clear and yellow Outcome: tolerated well, no immediate complications  After discussion on risks/benefits/indications, informed verbal consent was obtained and a timeout was performed. The patient was lying prone on exam table and the patient's knee was prepped with Chloraprep and alcohol swabs. Utilizing ultrasound-guidance with the probe in a longitudinal position, the Baker's cyst was identified and the surrounding soft tissue area was anesthesized first with a 25G, 1.5" needle with approximately 3 cc of lidocaine 1% using sterile technique. Following this, using ultrasound guidance via an in-plane approach, an 18G, 1.5" needle was directed into cyst and approximately 13 cc's of clear, yellow fluid was aspirated from the cyst and posterior knee joint. Utilizing the same portal and ultrasound-guidance, a 1:1 bupivicaine:depo-medrol injectate was administered into the aspirated cyst space.  Patient tolerated the procedure well without immediate complications. Procedure, treatment alternatives, risks and benefits explained, specific risks discussed. Consent was given by the patient. Immediately prior to procedure a time out was called to verify the correct patient, procedure, equipment, support staff and site/side marked as required. Patient was prepped and draped in the usual sterile fashion.     - I evaluated the patient about 5-10 minutes post-injection and she had immediate improvement in pain and range of motion - follow-up with Dr. Erlinda Hong as indicated; I am happy to see them as needed  Elba Barman, DO [place]  This note was dictated using Dragon naturally speaking software and may contain errors in syntax, spelling, or content which have not been identified prior to signing this note.

## 2022-07-19 NOTE — Progress Notes (Signed)
Xu referral for left bakers cyst eval/aspiration

## 2022-07-22 ENCOUNTER — Encounter: Payer: Self-pay | Admitting: Physical Medicine & Rehabilitation

## 2022-07-25 ENCOUNTER — Encounter: Payer: Self-pay | Admitting: Orthopaedic Surgery

## 2022-07-26 ENCOUNTER — Encounter: Payer: Self-pay | Admitting: Rehabilitative and Restorative Service Providers"

## 2022-07-26 ENCOUNTER — Other Ambulatory Visit: Payer: Self-pay

## 2022-07-26 ENCOUNTER — Ambulatory Visit: Payer: BC Managed Care – PPO | Admitting: Rehabilitative and Restorative Service Providers"

## 2022-07-26 DIAGNOSIS — R262 Difficulty in walking, not elsewhere classified: Secondary | ICD-10-CM

## 2022-07-26 DIAGNOSIS — M6281 Muscle weakness (generalized): Secondary | ICD-10-CM

## 2022-07-26 DIAGNOSIS — M79605 Pain in left leg: Secondary | ICD-10-CM | POA: Diagnosis not present

## 2022-07-26 NOTE — Therapy (Signed)
OUTPATIENT PHYSICAL THERAPY EVALUATION   Patient Name: Robin [name] MRN: [id number] DOB:[birth year], 51 y.o., female Today's Date: 07/26/2022  END OF SESSION:   PT End of Session - 07/26/22 0811     Visit Number 1    Number of Visits 20    Date for PT Re-Evaluation 10/04/22    Authorization Type [employer] $[id number] copay    Authorization - Visit Number 1    Authorization - Number of Visits 19    PT Start Time 0812    PT Stop Time 0847    PT Time Calculation (min) 35 min    Activity Tolerance Patient tolerated treatment well    Behavior During Therapy WFL for tasks assessed/performed             Past Medical History:  Diagnosis Date   ADD (attention deficit disorder)    Allergy    Anemia    Anorexia nervosa with bulimia    Per [place] New Patient Packet   Anxiety    Asthma    Carpal tunnel syndrome    Per [facility] New Patient Packet   Depression    Dry eyes    Per [place] New Patient Packet   Dry mouth    Per [place] New Patient Packet   Fibroids    Per [facility] New Patient Packet   History of hip x-ray 2021   Per [place] New Patient Packet   History of Papanicolaou smear of cervix 2021   Per [place] New Patient Packet, Dr.Pickens   Hyperlipidemia    Ileitis    Lupus ([place])    PONV (postoperative nausea and vomiting)    mild nausea    Raynaud disease    Retinal tear    Per [facility] New Patient Packet   Systemic lupus erythematosus ([place])    Past Surgical History:  Procedure Laterality Date   CARPAL TUNNEL RELEASE Right    08/2019   CESAREAN SECTION  2003   CESAREAN SECTION  2005   Per [facility] New Patient Packet   COLONOSCOPY  2017   Dr.Jacobs   cyst drained Left 2023   knee   DENTAL SURGERY  2009   Fromed tooth and chronic infection   DILITATION & CURRETTAGE/HYSTROSCOPY WITH NOVASURE ABLATION N/A 06/03/2020   Procedure: DILATATION & CURETTAGE/HYSTEROSCOPY WITH NOVASURE ABLATION;  Surgeon: Aletha Halim, MD;  Location: [facility];  Service: Gynecology;  Laterality: N/A;    ENDOMETRIAL BIOPSY  04/20/2020       EYE SURGERY Bilateral    cryoretinopexy   Patient Active Problem List   Diagnosis Date Noted   Left hamstring injury 07/15/2022   Labral tear of shoulder, degenerative, right 06/21/2022   Left carpal tunnel syndrome 05/26/2022   Foot sprain, left, initial encounter 05/19/2022   Baker's cyst of knee, left 04/21/2022   Right carpal tunnel syndrome 03/16/2022   Flushing 04/14/2021   Hypermobility syndrome 11/27/2020   Seasonal and perennial allergic rhinitis 11/06/2018   Mild intermittent asthma without complication 53/61/4431   Flexural atopic dermatitis 11/06/2018   History of hyperlipidemia 11/08/2016   History of anxiety 11/08/2016   History of retinal tear 11/08/2016   History of Bilateral plantar fasciitis 08/10/2016   High risk medication use 07/04/2016   Lupus ([place]) 05/23/2016   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[place]) 03/18/2016   ADHD (attention deficit hyperactivity disorder) 12/22/2014   Raynaud's phenomenon 10/17/2013   Hyperlipidemia 09/28/2011    PCP: Sandrea Hughs NP  REFERRING PROVIDER: Leandrew Koyanagi, MD  REFERRING  DIAG: V89.381O (ICD-10-CM) - Left hamstring injury, initial encounter  THERAPY DIAG:  Pain in left leg  Muscle weakness (generalized)  Difficulty in walking, not elsewhere classified  Rationale for Evaluation and Treatment: Rehabilitation  ONSET DATE: Around 07/13/2022 for hamstring, several years for other complaints  SUBJECTIVE:   SUBJECTIVE STATEMENT: Pt indicated having tightness in hamstring for a period of time prior to walking dog and feeling pop in back of thigh resulting in documented hamstring tear.   Pt indicated complaints in back/SI joint region and also Lt posterior thigh.  Pt indicated soreness noted in SI joint region. Pt indicated history of labral tear a "a couple years ago" and experienced stiffness and worsening over time.  She indicated she finally got it looked at and did have physical therapy.   She indicated feeling a lot of weakness and "feeling locked up."  Pt indicated since that happened, she has experienced some complaints of Lt knee pain and thigh pain.  Pt indicated she really wanted to work on everything and get stronger and move better.   Pt reported she noted that an increase in weight training let to aggravation of baker's cyst.   PERTINENT HISTORY: Lt hamstring tear grade 1. Bakers Cyst per MD note.  Med history : lupus  PAIN:  Pain location: Lt hamstring/posterior current 3/10 at worst  8/10  , Lt lumbar/SI joint/hip : current 7/10, at worst 7/10 Pain description: weakness, strain      hip/SI joint: tenderness, tightness, spasms Aggravating factors: walking faster uphill, full weight on leg hurts thigh,  hip: walking at times, various pressures in area Relieving factors: taping, heat, OTC medicine as necessary  PRECAUTIONS: None  WEIGHT BEARING RESTRICTIONS: No  FALLS:  Has patient fallen in last 6 months? Yes 2x - Outside of house Pt indicated a fear of falling.    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  Heal hamstring completely   OBJECTIVE:    PATIENT SURVEYS:  07/26/2022 FOTO intake:   31 predicted:  58  COGNITION: 07/26/2022 Overall cognitive status: WFL    SENSATION: 07/26/2022 WFL  MUSCLE LENGTH: 07/26/2022 Hamstrings passive SLR supine: Right 90 deg; Left 70 deg c pain  POSTURE:  07/26/2022 unremarkable in standing  PALPATION: 07/26/2022 Tenderness noted to light touch in Lt lumbar paraspinals  ROM:   ROM 07/26/2022 Right 07/26/2022 Left 07/26/2022  Hip flexion     Hip extension     Hip abduction     Hip adduction     Hip internal rotation      Hip external rotation     Knee flexion     Knee extension          Lumbar extension 75% WFL c end range back complaints  Repeated x 5 in standing c ERP worsened    Lumbar flexion To floor no complaints          (Blank rows = not tested)  LOWER EXTREMITY MMT:  MMT Right 07/26/2022 Left 07/26/2022  Hip flexion 5/5 5/5  Hip extension Check next visit Check next visit  Hip abduction Check next visit Check next visit  Hip adduction    Hip internal rotation    Hip external rotation    Knee flexion 5/5  39, 36.4 lbs  3+/5 14 lbs With  pain  Knee extension 5/5 5/5  Ankle dorsiflexion 5/5 5/5  Ankle plantarflexion    Ankle inversion    Ankle eversion     (Blank rows = not tested)  SPECIAL TESTS:  07/26/2022 Lt slump test positive for complaints in thigh and calf, Negative on Rt Negative crossed slr bilateral for any radicular pain  FUNCTIONAL TESTS:  07/26/2022 18 inch chair transfer:  1st try without hands Lt SLS:  10 seconds, stopped due to back related complaints Rt SLS:  30 seconds c mild aberrant movement  GAIT: 07/26/2022 Independent ambulation    TODAY'S TREATMENT                                                                          DATE: 07/26/2022 Therex:    HEP instruction/performance c cues for techniques, handout provided.  Trial set performed of each for comprehension and symptom assessment.  See below for exercise list  PATIENT EDUCATION:  07/26/2022 Education details: HEP, POC Person educated: Patient Education method: Explanation, Demonstration, Verbal cues, and Handouts Education comprehension: verbalized understanding, returned demonstration, and verbal cues required  HOME EXERCISE PROGRAM: Access Code: [email or web address] URL: [email or web address] Date: 07/26/2022 Prepared by: Scot Jun  Exercises - Supine Bridge  - 1-2 x daily - 7 x weekly - 1-2 sets - 10 reps - 2 hold - Supine 90/90 Sciatic Nerve Glide with Knee  Flexion/Extension  - 1-2 x daily - 7 x weekly - 1-2 sets - 10 reps - Seated Hamstring Set (Mirrored)  - 1-2 x daily - 7 x weekly - 1 sets - 10-15 reps - 5-10 hold Supine hip extension into table isometric hold 5 seconds x 10-15 1-2 x per day (not on website)  ASSESSMENT:  CLINICAL IMPRESSION: Patient is a 51 y.o. who comes to clinic with complaints of Lt posterior thigh pain as well as low back/Lt hip pain with mobility, strength and movement coordination deficits that impair their ability to perform usual daily and recreational functional activities without increase difficulty/symptoms at this time.  Patient to benefit from skilled PT services to address impairments and limitations to improve to previous level of function without restriction secondary to condition.   OBJECTIVE IMPAIRMENTS: decreased activity tolerance, decreased balance, decreased coordination, decreased endurance, decreased mobility, difficulty walking, decreased ROM, decreased strength, increased fascial restrictions, impaired perceived functional ability, increased muscle spasms, impaired flexibility, improper body mechanics, and pain.   ACTIVITY LIMITATIONS: carrying, lifting, bending, standing, squatting, stairs, transfers, and locomotion level  PARTICIPATION LIMITATIONS: cleaning, interpersonal relationship, community activity, occupation, yard work, and exercise routine, walking dog  PERSONAL FACTORS: Time since onset of injury/illness/exacerbation and multiple treatment areas, lupus  are also affecting patient's functional outcome.   REHAB POTENTIAL: Good  CLINICAL DECISION MAKING: Stable/uncomplicated  EVALUATION COMPLEXITY: Low   GOALS: Goals reviewed with patient? Yes  SHORT TERM GOALS: (target date for Short term goals are 3 weeks 08/16/2022)   1.  Patient will demonstrate independent use of home exercise program to maintain progress from in clinic treatments.  Goal status: New  LONG TERM GOALS: (target  dates for all long term goals are 10 weeks  10/04/2022 )   1. Patient will demonstrate/report  pain at worst less than or equal to 2/10 to facilitate minimal limitation in daily activity secondary to pain symptoms.  Goal status: New   2. Patient will demonstrate independent use of home exercise program to facilitate ability to maintain/progress functional gains from skilled physical therapy services.  Goal status: New   3. Patient will demonstrate FOTO outcome > or = 58 % to indicate reduced disability due to condition.  Goal status: New   4.  Patient will demonstrate Lt LE MMT 5/5 , dynamometry within 15 % of Rt throughout to faciltiate usual transfers, stairs, squatting at [facility] for daily life.   Goal status: New   5.  Patient will demonstrate Lt passive hamstring SLR to 90 deg equal to Rt s symptoms for normal mobility.  Goal status: New   6.  Patient will demonstrate bilateral SLS > 30 seconds to facilitate stability in ambulation on even and uneven ground.  Goal status: New   7.  Patient will demonstrate lumbar AROM WFL s symptoms to facilitate usual mobility at PLOF in standing/walking.  Goal Status: New   PLAN:  PT FREQUENCY: 1-2x/week  PT DURATION: 10 weeks  PLANNED INTERVENTIONS: Therapeutic exercises, Therapeutic activity, Neuro Muscular re-education, Balance training, Gait training, Patient/Family education, Joint mobilization, Stair training, DME instructions, Dry Needling, Electrical stimulation, Traction, Cryotherapy, vasopneumatic deviceMoist heat, Taping, Ultrasound, Ionotophoresis 14m/ml Dexamethasone, and Manual therapy.  All included unless contraindicated  PLAN FOR NEXT SESSION: Review HEP knowledge/results.   Check additional hip strength measurements.  Patient would like review of existing exercises from past therapy.    MScot Jun PT, DPT, OCS, ATC 07/26/22  9:20 AM

## 2022-07-27 DIAGNOSIS — F331 Major depressive disorder, recurrent, moderate: Secondary | ICD-10-CM | POA: Diagnosis not present

## 2022-07-28 ENCOUNTER — Ambulatory Visit: Payer: BC Managed Care – PPO | Admitting: Physical Therapy

## 2022-07-28 ENCOUNTER — Encounter: Payer: Self-pay | Admitting: Nurse Practitioner

## 2022-07-28 ENCOUNTER — Encounter: Payer: Self-pay | Admitting: Physical Therapy

## 2022-07-28 DIAGNOSIS — R262 Difficulty in walking, not elsewhere classified: Secondary | ICD-10-CM

## 2022-07-28 DIAGNOSIS — M6281 Muscle weakness (generalized): Secondary | ICD-10-CM

## 2022-07-28 DIAGNOSIS — M79605 Pain in left leg: Secondary | ICD-10-CM | POA: Diagnosis not present

## 2022-07-28 NOTE — Therapy (Signed)
OUTPATIENT PHYSICAL THERAPY TREATMENT NOTE   Patient Name: Robin [name] MRN: [id number] DOB:[birth year], 51 y.o., female Today's Date: 07/28/2022  END OF SESSION:   PT End of Session - 07/28/22 0808     Visit Number 2    Number of Visits 20    Date for PT Re-Evaluation 10/04/22    Authorization Type BCBS $40 copay    Authorization - Visit Number 1    Authorization - Number of Visits 19    PT Start Time 0808    PT Stop Time 0846    PT Time Calculation (min) 38 min    Activity Tolerance Patient tolerated treatment well    Behavior During Therapy WFL for tasks assessed/performed             Past Medical History:  Diagnosis Date   ADD (attention deficit disorder)    Allergy    Anemia    Anorexia nervosa with bulimia    Per [place] [facility] Patient Packet   Anxiety    Asthma    Carpal tunnel syndrome    Per [place] [facility] Patient Packet   Depression    Dry eyes    Per [place] [facility] Patient Packet   Dry mouth    Per [place] New Patient Packet   Fibroids    Per [facility] Patient Packet   History of hip x-ray 2021   Per [place] [facility] Patient Packet   History of Papanicolaou smear of cervix 2021   Per [place] New Patient Packet, Dr.Pickens   Hyperlipidemia    Ileitis    Lupus ([place])    PONV (postoperative nausea and vomiting)    mild nausea    Raynaud disease    Retinal tear    Per [facility] Patient Packet   Systemic lupus erythematosus ([place])    Past Surgical History:  Procedure Laterality Date   CARPAL TUNNEL RELEASE Right    08/2019   CESAREAN SECTION  2003   CESAREAN SECTION  2005   Per [facility] Patient Packet   COLONOSCOPY  2017   Dr.Jacobs   cyst drained Left 2023   knee   DENTAL SURGERY  2009   Fromed tooth and chronic infection   DILITATION & CURRETTAGE/HYSTROSCOPY WITH NOVASURE ABLATION N/A 06/03/2020   Procedure: DILATATION & CURETTAGE/HYSTEROSCOPY WITH NOVASURE ABLATION;  Surgeon: Aletha Halim, MD;  Location: [place];  Service: Gynecology;  Laterality: N/A;    ENDOMETRIAL BIOPSY  04/20/2020       EYE SURGERY Bilateral    cryoretinopexy   Patient Active Problem List   Diagnosis Date Noted   Left hamstring injury 07/15/2022   Labral tear of shoulder, degenerative, right 06/21/2022   Left carpal tunnel syndrome 05/26/2022   Foot sprain, left, initial encounter 05/19/2022   Baker's cyst of knee, left 04/21/2022   Right carpal tunnel syndrome 03/16/2022   Flushing 04/14/2021   Hypermobility syndrome 11/27/2020   Seasonal and perennial allergic rhinitis 11/06/2018   Mild intermittent asthma without complication 20/35/5974   Flexural atopic dermatitis 11/06/2018   History of hyperlipidemia 11/08/2016   History of anxiety 11/08/2016   History of retinal tear 11/08/2016   History of Bilateral plantar fasciitis 08/10/2016   High risk medication use 07/04/2016   Lupus ([place]) 05/23/2016   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[place]) 03/18/2016   ADHD (attention deficit hyperactivity disorder) 12/22/2014   Raynaud's phenomenon 10/17/2013   Hyperlipidemia 09/28/2011     THERAPY DIAG:  Pain in left leg  Muscle weakness (generalized)  Difficulty in walking, not elsewhere classified  PCP: Ngetich, Nelda Bucks NP   REFERRING PROVIDER: Leandrew Koyanagi, MD   REFERRING DIAG: [phone] (ICD-10-CM) - Left hamstring injury, initial encounter   THERAPY DIAG:  Pain in left leg   Muscle weakness (generalized)   Difficulty in walking, not elsewhere classified   Rationale for Evaluation and Treatment: Rehabilitation   ONSET DATE: Around 07/13/2022 for hamstring, several years for other complaints   SUBJECTIVE:    SUBJECTIVE STATEMENT: She states a little better overall. Some soreness from the exercises, not pain during them.    PERTINENT HISTORY: Lt hamstring tear grade 1. Bakers Cyst per MD note.  Med history : lupus   PAIN:  Pain location: Lt hamstring/posterior current 4/10 Pain today in left low back to hamstring description: weakness, strain      hip/SI  joint: tenderness, tightness, spasms Aggravating factors: walking faster uphill, full weight on leg hurts thigh,  hip: walking at times, various pressures in area Relieving factors: taping, heat, OTC medicine as necessary   PRECAUTIONS: None   WEIGHT BEARING RESTRICTIONS: No   FALLS:  Has patient fallen in last 6 months? Yes 2x - Outside of house Pt indicated a fear of falling.     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  Heal hamstring completely     OBJECTIVE:      PATIENT SURVEYS:  07/26/2022 FOTO intake:   31 predicted:  58   COGNITION: 07/26/2022 Overall cognitive status: WFL                        SENSATION: 07/26/2022 WFL   MUSCLE LENGTH: 07/26/2022 Hamstrings passive SLR supine: Right 90 deg; Left 70 deg c pain   POSTURE:  07/26/2022 unremarkable in standing   PALPATION: 07/26/2022 Tenderness noted to light touch in Lt lumbar paraspinals   ROM:    ROM 07/26/2022 Right 07/26/2022 Left 07/26/2022  Hip flexion        Hip extension        Hip abduction        Hip adduction        Hip internal rotation        Hip external rotation        Knee flexion        Knee extension                 Lumbar extension 75% WFL c end range back complaints   Repeated x 5 in standing c ERP worsened      Lumbar flexion To floor no complaints                (Blank rows = not tested)   LOWER EXTREMITY MMT:   MMT Right 07/26/2022 Left 07/26/2022 Left 07/28/22  Hip flexion 5/5 5/5   Hip extension Check next visit Check next visit 4/5  Hip abduction Check next visit Check next visit 4/5  Hip adduction       Hip internal rotation       Hip external rotation       Knee flexion  5/5   39, 36.4 lbs   3+/5 14 lbs With  pain   Knee extension 5/5  5/5   Ankle dorsiflexion 5/5 5/5   Ankle plantarflexion       Ankle inversion       Ankle eversion        (Blank rows = not tested)   SPECIAL TESTS:  07/26/2022 Lt slump test positive for complaints in thigh and calf, Negative on Rt Negative crossed slr bilateral for any radicular pain   FUNCTIONAL TESTS:  07/26/2022 18 inch chair transfer:  1st try without hands Lt SLS:  10 seconds, stopped due to back related complaints Rt SLS:  30 seconds c mild aberrant movement   GAIT: 07/26/2022 Independent ambulation      TODAY'S TREATMENT  07/28/22 Manual therapy for skilled palpation and Trigger Point Dry-Needling  Treatment instructions: Expect mild to moderate muscle soreness. Patient Consent Given: Yes Education handout provided: verbally provided Muscles treated: Left hamstrings, glutes and lumbar paraspinals and multifidi,  Treatment response/outcome: good overall tolerance,twitch response noted   Combined with Estim both micro current at 100 frequency for pain/spasm, and milli current at 2 frequency for muscle contraction/fatigue to relax muscles  Therex Prone hip extension 2 ses hold X 10 bilat Sidelying hip abduction (with top leg in slight extension) X 10 bilat Supine left hamstring isometric 5 sec X 10 interspaced with hamstring stretching with strap 5 sec X10 Supine single leg bridge X10 bilat     07/26/2022 Therex:    HEP instruction/performance c cues for techniques, handout provided.  Trial set performed of each for comprehension and symptom assessment.  See below for exercise list   PATIENT EDUCATION:   07/26/2022 Education details: HEP, POC Person educated: Patient Education method: Explanation, Demonstration, Verbal cues, and Handouts Education comprehension: verbalized understanding, returned demonstration, and verbal cues required   HOME EXERCISE PROGRAM: Access Code: [email or web address] URL:  [email or web address] Date: 07/26/2022 Prepared by: Scot Jun   Exercises - Supine Bridge  - 1-2 x daily - 7 x weekly - 1-2 sets - 10 reps - 2 hold - Supine 90/90 Sciatic Nerve Glide with Knee Flexion/Extension  - 1-2 x daily - 7 x weekly - 1-2 sets - 10 reps - Seated Hamstring Set (Mirrored)  - 1-2 x daily - 7 x weekly - 1 sets - 10-15 reps - 5-10 hold Supine hip extension into table isometric hold 5 seconds x 10-15 1-2 x per day (not on website)   ASSESSMENT:   CLINICAL IMPRESSION: Trial of DN today combined with Estim to left side low back/glutes/hamstring appeared to to reduce some pain and tightness. General glute weakness noted bilat so provided her with 2 additional exercises for this.    OBJECTIVE IMPAIRMENTS: decreased activity tolerance, decreased balance, decreased coordination, decreased endurance, decreased mobility, difficulty walking, decreased ROM, decreased strength, increased fascial restrictions, impaired perceived functional ability, increased muscle spasms, impaired flexibility, improper body mechanics, and pain.    ACTIVITY LIMITATIONS: carrying, lifting, bending, standing, squatting, stairs, transfers, and locomotion level   PARTICIPATION LIMITATIONS: cleaning, interpersonal relationship, community activity, occupation, yard work, and exercise routine, walking dog   PERSONAL FACTORS: Time since onset of injury/illness/exacerbation and multiple treatment areas, lupus  are also affecting patient's functional outcome.    REHAB POTENTIAL: Good   CLINICAL DECISION MAKING: Stable/uncomplicated   EVALUATION COMPLEXITY: Low     GOALS: Goals reviewed with patient? Yes   SHORT TERM GOALS: (target date for Short term goals are 3 weeks 08/16/2022)    1.  Patient will demonstrate independent use of home exercise program to maintain progress from in clinic treatments.  Goal status: New   LONG TERM GOALS: (target dates for all long term goals are 10  weeks  10/04/2022 )   1. Patient will demonstrate/report pain at worst less than or equal to 2/10 to facilitate minimal limitation in daily activity secondary to pain symptoms.   Goal status: New   2. Patient will demonstrate independent use of home exercise program to facilitate ability to maintain/progress functional gains from skilled physical therapy services.   Goal status: New   3. Patient will demonstrate FOTO outcome > or = 58 % to indicate reduced disability due to condition.   Goal status: New   4.  Patient will demonstrate Lt LE MMT 5/5 , dynamometry within 15 % of Rt throughout to faciltiate usual transfers, stairs, squatting at [place] for daily life.    Goal status: New   5.  Patient will demonstrate Lt passive hamstring SLR to 90 deg equal to Rt s symptoms for normal mobility.  Goal status: New   6.  Patient will demonstrate bilateral SLS > 30 seconds to facilitate stability in ambulation on even and uneven ground.  Goal status: New   7.  Patient will demonstrate lumbar AROM WFL s symptoms to facilitate usual mobility at PLOF in standing/walking.  Goal Status: New     PLAN:   PT FREQUENCY: 1-2x/week   PT DURATION: 10 weeks   PLANNED INTERVENTIONS: Therapeutic exercises, Therapeutic activity, Neuro Muscular re-education, Balance training, Gait training, Patient/Family education, Joint mobilization, Stair training, DME instructions, Dry Needling, Electrical stimulation, Traction, Cryotherapy, vasopneumatic deviceMoist heat, Taping, Ultrasound, Ionotophoresis 79m/ml Dexamethasone, and Manual therapy.  All included unless contraindicated   PLAN FOR NEXT SESSION: How was DN response last time and repeat if desired. Needs hamstring stretching and general hip/hamstring/lumbar strengthening   BDebbe Odea PT,DPT 07/28/2022, 8:35 AM

## 2022-08-01 ENCOUNTER — Encounter: Payer: Self-pay | Admitting: Nurse Practitioner

## 2022-08-01 ENCOUNTER — Encounter: Payer: Self-pay | Admitting: Physical Therapy

## 2022-08-01 ENCOUNTER — Ambulatory Visit: Payer: BC Managed Care – PPO | Admitting: Physical Therapy

## 2022-08-01 DIAGNOSIS — M79605 Pain in left leg: Secondary | ICD-10-CM

## 2022-08-01 DIAGNOSIS — R262 Difficulty in walking, not elsewhere classified: Secondary | ICD-10-CM | POA: Diagnosis not present

## 2022-08-01 DIAGNOSIS — M6281 Muscle weakness (generalized): Secondary | ICD-10-CM

## 2022-08-01 NOTE — Therapy (Signed)
OUTPATIENT PHYSICAL THERAPY TREATMENT NOTE   Patient Name: Robin [name] MRN: [id number] DOB:Sep 26, 1970, 51 y.o., female Today's Date: 08/01/2022  END OF SESSION:   PT End of Session - 08/01/22 1209     Visit Number 3    Number of Visits 20    Date for PT Re-Evaluation 10/04/22    Authorization Type [employer] $40 copay    Authorization - Visit Number 3    Authorization - Number of Visits 19    PT Start Time 1150    PT Stop Time 1228    PT Time Calculation (min) 38 min    Activity Tolerance Patient tolerated treatment well    Behavior During Therapy WFL for tasks assessed/performed             Past Medical History:  Diagnosis Date   ADD (attention deficit disorder)    Allergy    Anemia    Anorexia nervosa with bulimia    Per [place] New Patient Packet   Anxiety    Asthma    Carpal tunnel syndrome    Per [place] New Patient Packet   Depression    Dry eyes    Per [place] New Patient Packet   Dry mouth    Per [place] New Patient Packet   Fibroids    Per [facility] New Patient Packet   History of hip x-ray 2021   Per [place] New Patient Packet   History of Papanicolaou smear of cervix 2021   Per [place] New Patient Packet, Dr.Pickens   Hyperlipidemia    Ileitis    Lupus ([place])    PONV (postoperative nausea and vomiting)    mild nausea    Raynaud disease    Retinal tear    Per [facility] New Patient Packet   Systemic lupus erythematosus ([place])    Past Surgical History:  Procedure Laterality Date   CARPAL TUNNEL RELEASE Right    08/2019   CESAREAN SECTION  2003   CESAREAN SECTION  2005   Per [facility] New Patient Packet   COLONOSCOPY  2017   Dr.Jacobs   cyst drained Left 2023   knee   DENTAL SURGERY  2009   Fromed tooth and chronic infection   DILITATION & CURRETTAGE/HYSTROSCOPY WITH NOVASURE ABLATION N/A 06/03/2020   Procedure: DILATATION & CURETTAGE/HYSTEROSCOPY WITH NOVASURE ABLATION;  Surgeon: Aletha Halim, MD;  Location: [place];  Service: Gynecology;  Laterality: N/A;    ENDOMETRIAL BIOPSY  04/20/2020       EYE SURGERY Bilateral    cryoretinopexy   Patient Active Problem List   Diagnosis Date Noted   Left hamstring injury 07/15/2022   Labral tear of shoulder, degenerative, right 06/21/2022   Left carpal tunnel syndrome 05/26/2022   Foot sprain, left, initial encounter 05/19/2022   Baker's cyst of knee, left 04/21/2022   Right carpal tunnel syndrome 03/16/2022   Flushing 04/14/2021   Hypermobility syndrome 11/27/2020   Seasonal and perennial allergic rhinitis 11/06/2018   Mild intermittent asthma without complication 18/40/3754   Flexural atopic dermatitis 11/06/2018   History of hyperlipidemia 11/08/2016   History of anxiety 11/08/2016   History of retinal tear 11/08/2016   History of Bilateral plantar fasciitis 08/10/2016   High risk medication use 07/04/2016   Lupus ([place]) 05/23/2016   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[place]) 03/18/2016   ADHD (attention deficit hyperactivity disorder) 12/22/2014   Raynaud's phenomenon 10/17/2013   Hyperlipidemia 09/28/2011     THERAPY DIAG:  Pain in left leg  Muscle weakness (generalized)  Difficulty in walking, not elsewhere classified  PCP: Ngetich, Nelda Bucks NP   REFERRING PROVIDER: Leandrew Koyanagi, MD   REFERRING DIAG: [phone] (ICD-10-CM) - Left hamstring injury, initial encounter    Rationale for Evaluation and Treatment: Rehabilitation   ONSET DATE: Around 07/13/2022 for hamstring, several years for other complaints   SUBJECTIVE:    SUBJECTIVE STATEMENT: She relays some relief from DN combo with Estim last time, still has some tenderness from SIJ to her hamstring.    PERTINENT HISTORY: Lt hamstring tear grade 1. Bakers Cyst per MD note.  Med history : lupus   PAIN:  Pain location: Lt hamstring/posterior current 2-3/10 Pain today in left low back to hamstring description: weakness, strain      hip/SI joint: tenderness, tightness, spasms Aggravating factors: walking faster uphill, full weight on leg  hurts thigh,  hip: walking at times, various pressures in area Relieving factors: taping, heat, OTC medicine as necessary   PRECAUTIONS: None   WEIGHT BEARING RESTRICTIONS: No   FALLS:  Has patient fallen in last 6 months? Yes 2x - Outside of house Pt indicated a fear of falling.     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  Heal hamstring completely     OBJECTIVE:      PATIENT SURVEYS:  07/26/2022 FOTO intake:   31 predicted:  58   COGNITION: 07/26/2022 Overall cognitive status: WFL                        SENSATION: 07/26/2022 WFL   MUSCLE LENGTH: 07/26/2022 Hamstrings passive SLR supine: Right 90 deg; Left 70 deg c pain   POSTURE:  07/26/2022 unremarkable in standing   PALPATION: 07/26/2022 Tenderness noted to light touch in Lt lumbar paraspinals   ROM:    ROM 07/26/2022 Right 07/26/2022 Left 07/26/2022  Hip flexion        Hip extension        Hip abduction        Hip adduction        Hip internal rotation        Hip external rotation        Knee flexion        Knee extension                 Lumbar extension 75% WFL c end range back complaints   Repeated x 5 in standing c ERP worsened      Lumbar flexion To floor no complaints                (Blank rows = not tested)   LOWER EXTREMITY MMT:   MMT Right 07/26/2022 Left 07/26/2022 Left 07/28/22  Hip flexion 5/5 5/5   Hip extension Check next visit Check next visit 4/5  Hip abduction Check next visit Check next visit 4/5  Hip adduction       Hip internal rotation       Hip external rotation       Knee flexion 5/5   39, 36.4 lbs   3+/5 14 lbs With  pain   Knee extension 5/5 5/5   Ankle dorsiflexion 5/5 5/5    Ankle plantarflexion  Ankle inversion       Ankle eversion        (Blank rows = not tested)   SPECIAL TESTS:  07/26/2022 Lt slump test positive for complaints in thigh and calf, Negative on Rt Negative crossed slr bilateral for any radicular pain   FUNCTIONAL TESTS:  07/26/2022 18 inch chair transfer:  1st try without hands Lt SLS:  10 seconds, stopped due to back related complaints Rt SLS:  30 seconds c mild aberrant movement   GAIT: 07/26/2022 Independent ambulation      TODAY'S TREATMENT  08/01/22 Manual therapy for skilled palpation and Trigger Point Dry-Needling  Treatment instructions: Expect mild to moderate muscle soreness. Patient Consent Given: Yes Education handout provided: verbally provided Muscles treated: Left hamstrings, glutes and lumbar paraspinals and multifidi,  Treatment response/outcome: good overall tolerance,twitch response noted   Combined with Estim both micro current at 100 frequency for pain/spasm, and milli current at 2 frequency for muscle contraction/fatigue to relax muscles  07/28/22 Manual therapy for skilled palpation and Trigger Point Dry-Needling  Treatment instructions: Expect mild to moderate muscle soreness. Patient Consent Given: Yes Education handout provided: verbally provided Muscles treated: Left hamstrings, glutes and lumbar paraspinals and multifidi,  Treatment response/outcome: good overall tolerance,twitch response noted   Combined with Estim both micro current at 100 frequency for pain/spasm, and milli current at 2 frequency for muscle contraction/fatigue to relax muscles  Therex Prone hip extension 2 ses hold X 10 bilat Sidelying hip abduction (with top leg in slight extension) X 10 bilat Supine left hamstring isometric 5 sec X 10 interspaced with hamstring stretching with strap 5 sec X10 Supine single leg bridge X10 bilat    PATIENT EDUCATION:   07/26/2022 Education details: HEP, POC Person educated:  Patient Education method: Consulting civil engineer, Media planner, Verbal cues, and Handouts Education comprehension: verbalized understanding, returned demonstration, and verbal cues required   HOME EXERCISE PROGRAM: Access Code: [email or web address] URL: [email or web address] Date: 07/26/2022 Prepared by: Scot Jun   Exercises - Supine Bridge  - 1-2 x daily - 7 x weekly - 1-2 sets - 10 reps - 2 hold - Supine 90/90 Sciatic Nerve Glide with Knee Flexion/Extension  - 1-2 x daily - 7 x weekly - 1-2 sets - 10 reps - Seated Hamstring Set (Mirrored)  - 1-2 x daily - 7 x weekly - 1 sets - 10-15 reps - 5-10 hold Supine hip extension into table isometric hold 5 seconds x 10-15 1-2 x per day (not on website)   ASSESSMENT:   CLINICAL IMPRESSION: Continued with DN combined with Estim to left side low back/glutes/hamstring as she had some relief from this last time. Performed this longer today.  PT recommending to continue with current treatment plan at this time.   OBJECTIVE IMPAIRMENTS: decreased activity tolerance, decreased balance, decreased coordination, decreased endurance, decreased mobility, difficulty walking, decreased ROM, decreased strength, increased fascial restrictions, impaired perceived functional ability, increased muscle spasms, impaired flexibility, improper body mechanics, and pain.    ACTIVITY LIMITATIONS: carrying, lifting, bending, standing, squatting, stairs, transfers, and locomotion level   PARTICIPATION LIMITATIONS: cleaning, interpersonal relationship, community activity, occupation, yard work, and exercise routine, walking dog   PERSONAL FACTORS: Time since onset of injury/illness/exacerbation and multiple treatment areas, lupus  are also affecting patient's functional outcome.    REHAB POTENTIAL: Good   CLINICAL DECISION MAKING: Stable/uncomplicated   EVALUATION COMPLEXITY: Low     GOALS: Goals reviewed with patient? Yes   SHORT TERM GOALS: (target date for Short  term goals are  3 weeks 08/16/2022)    1.  Patient will demonstrate independent use of home exercise program to maintain progress from in clinic treatments.   Goal status: New   LONG TERM GOALS: (target dates for all long term goals are 10 weeks  10/04/2022 )   1. Patient will demonstrate/report pain at worst less than or equal to 2/10 to facilitate minimal limitation in daily activity secondary to pain symptoms.   Goal status: New   2. Patient will demonstrate independent use of home exercise program to facilitate ability to maintain/progress functional gains from skilled physical therapy services.   Goal status: New   3. Patient will demonstrate FOTO outcome > or = 58 % to indicate reduced disability due to condition.   Goal status: New   4.  Patient will demonstrate Lt LE MMT 5/5 , dynamometry within 15 % of Rt throughout to faciltiate usual transfers, stairs, squatting at [facility] for daily life.    Goal status: New   5.  Patient will demonstrate Lt passive hamstring SLR to 90 deg equal to Rt s symptoms for normal mobility.  Goal status: New   6.  Patient will demonstrate bilateral SLS > 30 seconds to facilitate stability in ambulation on even and uneven ground.  Goal status: New   7.  Patient will demonstrate lumbar AROM WFL s symptoms to facilitate usual mobility at PLOF in standing/walking.  Goal Status: New     PLAN:   PT FREQUENCY: 1-2x/week   PT DURATION: 10 weeks   PLANNED INTERVENTIONS: Therapeutic exercises, Therapeutic activity, Neuro Muscular re-education, Balance training, Gait training, Patient/Family education, Joint mobilization, Stair training, DME instructions, Dry Needling, Electrical stimulation, Traction, Cryotherapy, vasopneumatic deviceMoist heat, Taping, Ultrasound, Ionotophoresis 29m/ml Dexamethasone, and Manual therapy.  All included unless contraindicated   PLAN FOR NEXT SESSION: How was DN response last time and repeat if desired. Check in on how HEP  is going. Needs hamstring stretching and general hip/hamstring/lumbar strengthening   BDebbe Odea PT,DPT 08/01/2022, 1:05 PM

## 2022-08-02 ENCOUNTER — Ambulatory Visit
Admission: RE | Admit: 2022-08-02 | Discharge: 2022-08-02 | Disposition: A | Discharge status: Home / Self Care | Payer: BC Managed Care – PPO | Source: Ambulatory Visit | Attending: Nurse Practitioner | Admitting: Nurse Practitioner

## 2022-08-02 DIAGNOSIS — D1803 Hemangioma of intra-abdominal structures: Secondary | ICD-10-CM | POA: Diagnosis not present

## 2022-08-02 DIAGNOSIS — K5900 Constipation, unspecified: Secondary | ICD-10-CM

## 2022-08-02 DIAGNOSIS — D259 Leiomyoma of uterus, unspecified: Secondary | ICD-10-CM | POA: Diagnosis not present

## 2022-08-02 DIAGNOSIS — R109 Unspecified abdominal pain: Secondary | ICD-10-CM

## 2022-08-02 DIAGNOSIS — R140 Abdominal distension (gaseous): Secondary | ICD-10-CM

## 2022-08-02 MED ORDER — IOPAMIDOL (ISOVUE-300) INJECTION 61%
100.0000 mL | Freq: Once | INTRAVENOUS | Status: AC | PRN
  Administered 2022-08-02: 100 mL via INTRAVENOUS

## 2022-08-09 ENCOUNTER — Encounter: Payer: Self-pay | Admitting: Rehabilitative and Restorative Service Providers"

## 2022-08-09 ENCOUNTER — Ambulatory Visit
Admission: RE | Admit: 2022-08-09 | Discharge: 2022-08-09 | Disposition: A | Discharge status: Home / Self Care | Payer: BC Managed Care – PPO | Source: Ambulatory Visit | Attending: Orthopaedic Surgery | Admitting: Orthopaedic Surgery

## 2022-08-09 ENCOUNTER — Ambulatory Visit: Payer: BC Managed Care – PPO | Admitting: Rehabilitative and Restorative Service Providers"

## 2022-08-09 DIAGNOSIS — M24111 Other articular cartilage disorders, right shoulder: Secondary | ICD-10-CM

## 2022-08-09 DIAGNOSIS — F411 Generalized anxiety disorder: Secondary | ICD-10-CM | POA: Diagnosis not present

## 2022-08-09 DIAGNOSIS — M6281 Muscle weakness (generalized): Secondary | ICD-10-CM | POA: Diagnosis not present

## 2022-08-09 DIAGNOSIS — S46011A Strain of muscle(s) and tendon(s) of the rotator cuff of right shoulder, initial encounter: Secondary | ICD-10-CM | POA: Diagnosis not present

## 2022-08-09 DIAGNOSIS — F339 Major depressive disorder, recurrent, unspecified: Secondary | ICD-10-CM | POA: Diagnosis not present

## 2022-08-09 DIAGNOSIS — S43431A Superior glenoid labrum lesion of right shoulder, initial encounter: Secondary | ICD-10-CM | POA: Diagnosis not present

## 2022-08-09 DIAGNOSIS — M79605 Pain in left leg: Secondary | ICD-10-CM | POA: Diagnosis not present

## 2022-08-09 DIAGNOSIS — R262 Difficulty in walking, not elsewhere classified: Secondary | ICD-10-CM

## 2022-08-09 DIAGNOSIS — F902 Attention-deficit hyperactivity disorder, combined type: Secondary | ICD-10-CM | POA: Diagnosis not present

## 2022-08-09 MED ORDER — IOPAMIDOL (ISOVUE-M 300) INJECTION 61%
13.0000 mL | Freq: Once | INTRAMUSCULAR | Status: AC | PRN
  Administered 2022-08-09: 13 mL via INTRA_ARTICULAR

## 2022-08-09 NOTE — Therapy (Signed)
OUTPATIENT PHYSICAL THERAPY TREATMENT NOTE   Patient Name: Robin [name] MRN: [id number] DOB:1970-09-15, 51 y.o., female Today's Date: 08/09/2022  END OF SESSION:   PT End of Session - 08/09/22 0933     Visit Number 4    Number of Visits 20    Date for PT Re-Evaluation 10/04/22    Authorization Type [employer] $40 copay    Authorization - Visit Number 4    Authorization - Number of Visits 19    PT Start Time 5883    PT Stop Time 0930    PT Time Calculation (min) 35 min    Activity Tolerance Patient tolerated treatment well    Behavior During Therapy WFL for tasks assessed/performed              Past Medical History:  Diagnosis Date   ADD (attention deficit disorder)    Allergy    Anemia    Anorexia nervosa with bulimia    Per [place] New Patient Packet   Anxiety    Asthma    Carpal tunnel syndrome    Per [place] [facility] Patient Packet   Depression    Dry eyes    Per [place] [facility] Patient Packet   Dry mouth    Per [place] [facility] Patient Packet   Fibroids    Per [facility] Patient Packet   History of hip x-ray 2021   Per [place] [facility] Patient Packet   History of Papanicolaou smear of cervix 2021   Per [place] [facility] Patient Packet, Dr.Pickens   Hyperlipidemia    Ileitis    Lupus ([place])    PONV (postoperative nausea and vomiting)    mild nausea    Raynaud disease    Retinal tear    Per [facility] New Patient Packet   Systemic lupus erythematosus ([place])    Past Surgical History:  Procedure Laterality Date   CARPAL TUNNEL RELEASE Right    08/2019   CESAREAN SECTION  2003   CESAREAN SECTION  2005   Per [facility] New Patient Packet   COLONOSCOPY  2017   Dr.Jacobs   cyst drained Left 2023   knee   DENTAL SURGERY  2009   Fromed tooth and chronic infection   DILITATION & CURRETTAGE/HYSTROSCOPY WITH NOVASURE ABLATION N/A 06/03/2020   Procedure: DILATATION & CURETTAGE/HYSTEROSCOPY WITH NOVASURE ABLATION;  Surgeon: Aletha Halim, MD;  Location: [place];  Service: Gynecology;  Laterality:  N/A;   ENDOMETRIAL BIOPSY  04/20/2020       EYE SURGERY Bilateral    cryoretinopexy   Patient Active Problem List   Diagnosis Date Noted   Left hamstring injury 07/15/2022   Labral tear of shoulder, degenerative, right 06/21/2022   Left carpal tunnel syndrome 05/26/2022   Foot sprain, left, initial encounter 05/19/2022   Baker's cyst of knee, left 04/21/2022   Right carpal tunnel syndrome 03/16/2022   Flushing 04/14/2021   Hypermobility syndrome 11/27/2020   Seasonal and perennial allergic rhinitis 11/06/2018   Mild intermittent asthma without complication 25/49/8264   Flexural atopic dermatitis 11/06/2018   History of hyperlipidemia 11/08/2016   History of anxiety 11/08/2016   History of retinal tear 11/08/2016   History of Bilateral plantar fasciitis 08/10/2016   High risk medication use 07/04/2016   Lupus ([place]) 05/23/2016   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[place]) 03/18/2016   ADHD (attention deficit hyperactivity disorder) 12/22/2014   Raynaud's phenomenon 10/17/2013   Hyperlipidemia 09/28/2011     THERAPY DIAG:  Pain in left leg  Muscle weakness (generalized)  Difficulty in walking, not elsewhere classified  PCP: Ngetich, Nelda Bucks NP   REFERRING PROVIDER: Leandrew Koyanagi, MD   REFERRING DIAG: [phone] (ICD-10-CM) - Left hamstring injury, initial encounter    Rationale for Evaluation and Treatment: Rehabilitation   ONSET DATE: Around 07/13/2022 for hamstring, several years for other complaints   SUBJECTIVE:    SUBJECTIVE STATEMENT: Pt indicated feeling lower back and glutes getting tight at times and that feels like it gets things "out of wack".   Pt indicated tightness at 4/10 in lower back today.  Pt indicated some tenderness in posterior thigh.    PERTINENT HISTORY: Lt hamstring tear grade 1. Bakers Cyst per MD note.  Med history : lupus   PAIN:  Pain location: 4/10 in back/thigh description: weakness, strain      hip/SI joint: tenderness, tightness, spasms Aggravating  factors: walking faster uphill, full weight on leg hurts thigh,  hip: walking at times, various pressures in area Relieving factors: taping, heat, OTC medicine as necessary   PRECAUTIONS: None   WEIGHT BEARING RESTRICTIONS: No   FALLS:  Has patient fallen in last 6 months? Yes 2x - Outside of house Pt indicated a fear of falling.     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  Heal hamstring completely     OBJECTIVE:      PATIENT SURVEYS:  07/26/2022 FOTO intake:   31 predicted:  58   COGNITION: 07/26/2022 Overall cognitive status: WFL                        SENSATION: 07/26/2022 WFL   MUSCLE LENGTH: 07/26/2022 Hamstrings passive SLR supine: Right 90 deg; Left 70 deg c pain   POSTURE:  07/26/2022 unremarkable in standing   PALPATION: 07/26/2022 Tenderness noted to light touch in Lt lumbar paraspinals   ROM:    ROM 07/26/2022 Right 07/26/2022 Left 07/26/2022 08/09/2022  Hip flexion         Hip extension         Hip abduction         Hip adduction         Hip internal rotation         Hip external rotation         Knee flexion         Knee extension                   Lumbar extension 75% WFL c end range back complaints   Repeated x 5 in standing c ERP worsened     75% WFL c tightness lower back (more on Lt)  Lumbar flexion To floor no complaints     Hands flat to floor             (Blank rows = not tested)   LOWER EXTREMITY MMT:   MMT Right 07/26/2022 Left 07/26/2022 Left 07/28/22 Left 08/09/2022  Hip flexion 5/5 5/5    Hip extension Check next visit Check next visit 4/5   Hip abduction Check next visit Check next visit 4/5   Hip adduction        Hip internal  rotation        Hip external rotation  Knee flexion 5/5   39, 36.4 lbs   3+/5 14 lbs With  pain   4/5 20.6, 18.4 lbs  Knee extension 5/5 5/5    Ankle dorsiflexion 5/5 5/5    Ankle plantarflexion        Ankle inversion        Ankle eversion         (Blank rows = not tested)   SPECIAL TESTS:  07/26/2022 Lt slump test positive for complaints in thigh and calf, Negative on Rt Negative crossed slr bilateral for any radicular pain   FUNCTIONAL TESTS:  07/26/2022 18 inch chair transfer:  1st try without hands Lt SLS:  10 seconds, stopped due to back related complaints Rt SLS:  30 seconds c mild aberrant movement   GAIT: 07/26/2022 Independent ambulation      TODAY'S TREATMENT  08/09/2022 Manual therapy for skilled palpation and Trigger Point Dry-Needling  Treatment instructions: Expect mild to moderate muscle soreness. Patient Consent Given: Yes Education handout provided: previously Muscles treated: Lt and Rt inferior lumbar paraspinals, Lt QL Treatment response/outcome: good overall tolerance,twitch response noted   Therex Reviewed HEP verbally c handout provided today with changes.  Advised use of just HEP to reduce amount of work at home in next week.  Rt sidelying Lt lumbar rotation stretch 15 sec x 3 Prone opposite arm/leg lifts 3 sec hold x 10 bilateral Machine leg curls double leg down, Lt leg up slowly x 10 10 lbs, x 10 15 lbs    08/01/22 Manual therapy for skilled palpation and Trigger Point Dry-Needling  Treatment instructions: Expect mild to moderate muscle soreness. Patient Consent Given: Yes Education handout provided: verbally provided Muscles treated: Left hamstrings, glutes and lumbar paraspinals and multifidi,  Treatment response/outcome: good overall tolerance,twitch response noted   Combined with Estim both micro current at 100 frequency for pain/spasm, and milli current at 2 frequency for muscle contraction/fatigue to relax  muscles  07/28/22 Manual therapy for skilled palpation and Trigger Point Dry-Needling  Treatment instructions: Expect mild to moderate muscle soreness. Patient Consent Given: Yes Education handout provided: verbally provided Muscles treated: Left hamstrings, glutes and lumbar paraspinals and multifidi,  Treatment response/outcome: good overall tolerance,twitch response noted   Combined with Estim both micro current at 100 frequency for pain/spasm, and milli current at 2 frequency for muscle contraction/fatigue to relax muscles  Therex Prone hip extension 2 ses hold X 10 bilat Sidelying hip abduction (with top leg in slight extension) X 10 bilat Supine left hamstring isometric 5 sec X 10 interspaced with hamstring stretching with strap 5 sec X10 Supine single leg bridge X10 bilat    PATIENT EDUCATION:   07/26/2022 Education details: HEP, POC Person educated: Patient Education method: Consulting civil engineer, Media planner, Verbal cues, and Handouts Education comprehension: verbalized understanding, returned demonstration, and verbal cues required   HOME EXERCISE PROGRAM: Access Code: [email or web address] URL: [email or web address] Date: 08/09/2022 Prepared by: Scot Jun  Exercises - Supine Bridge  - 1-2 x daily - 7 x weekly - 1-2 sets - 10 reps - 2 hold - Supine 90/90 Sciatic Nerve Glide with Knee Flexion/Extension  - 1-2 x daily - 7 x weekly - 1-2 sets - 10 reps - Sidelying Hip Abduction  - 1-2 x daily - 7 x weekly - 2-3 sets - 10-15 reps - Standing Hip Hiking  - 1-2 x daily - 7 x weekly - 1 sets - 10 reps - 5 hold - Seated Hamstring Set (Mirrored)  - 2-3 x daily - 7 x  weekly - 1 sets - 10-15 reps - 5-10 hold - Prone Alternating Arm and Leg Lifts  - 1-2 x daily - 7 x weekly - 1-2 sets - 10 reps - 3 hold - Sidelying Lumbar Rotation Stretch (Mirrored)  - 2-3 x daily - 7 x weekly - 1 sets - 5 reps - 15 hold   ASSESSMENT:   CLINICAL IMPRESSION: Mild improvements in Lt hamstring  strength when measured today.  Focused today on lumbar symptoms to promote improvement in myofascial restrictions from trigger points as well as adjustments to HEP to encourage improved strengthening and endurance to musculature in lumbar region.  Continued skilled PT services indicated .   OBJECTIVE IMPAIRMENTS: decreased activity tolerance, decreased balance, decreased coordination, decreased endurance, decreased mobility, difficulty walking, decreased ROM, decreased strength, increased fascial restrictions, impaired perceived functional ability, increased muscle spasms, impaired flexibility, improper body mechanics, and pain.    ACTIVITY LIMITATIONS: carrying, lifting, bending, standing, squatting, stairs, transfers, and locomotion level   PARTICIPATION LIMITATIONS: cleaning, interpersonal relationship, community activity, occupation, yard work, and exercise routine, walking dog   PERSONAL FACTORS: Time since onset of injury/illness/exacerbation and multiple treatment areas, lupus  are also affecting patient's functional outcome.    REHAB POTENTIAL: Good   CLINICAL DECISION MAKING: Stable/uncomplicated   EVALUATION COMPLEXITY: Low     GOALS: Goals reviewed with patient? Yes   SHORT TERM GOALS: (target date for Short term goals are 3 weeks 08/16/2022)    1.  Patient will demonstrate independent use of home exercise program to maintain progress from in clinic treatments.   Goal status: on going 08/09/2022   LONG TERM GOALS: (target dates for all long term goals are 10 weeks  10/04/2022 )   1. Patient will demonstrate/report pain at worst less than or equal to 2/10 to facilitate minimal limitation in daily activity secondary to pain symptoms.   Goal status: New   2. Patient will demonstrate independent use of home exercise program to facilitate ability to maintain/progress functional gains from skilled physical therapy services.   Goal status: New   3. Patient will demonstrate FOTO  outcome > or = 58 % to indicate reduced disability due to condition.   Goal status: New   4.  Patient will demonstrate Lt LE MMT 5/5 , dynamometry within 15 % of Rt throughout to faciltiate usual transfers, stairs, squatting at [facility] for daily life.    Goal status: New   5.  Patient will demonstrate Lt passive hamstring SLR to 90 deg equal to Rt s symptoms for normal mobility.  Goal status: New   6.  Patient will demonstrate bilateral SLS > 30 seconds to facilitate stability in ambulation on even and uneven ground.  Goal status: New   7.  Patient will demonstrate lumbar AROM WFL s symptoms to facilitate usual mobility at PLOF in standing/walking.  Goal Status: New     PLAN:   PT FREQUENCY: 1-2x/week   PT DURATION: 10 weeks   PLANNED INTERVENTIONS: Therapeutic exercises, Therapeutic activity, Neuro Muscular re-education, Balance training, Gait training, Patient/Family education, Joint mobilization, Stair training, DME instructions, Dry Needling, Electrical stimulation, Traction, Cryotherapy, vasopneumatic deviceMoist heat, Taping, Ultrasound, Ionotophoresis 51m/ml Dexamethasone, and Manual therapy.  All included unless contraindicated   PLAN FOR NEXT SESSION: DN as desired in back/hamstring.  Progress core strengthening (side planks, planks, hamstring loading.    MScot Jun PT, DPT, OCS, ATC 08/09/22  9:34 AM

## 2022-08-11 NOTE — Progress Notes (Signed)
I see she is scheduled to follow up with Robin [name] on 09/08/22.  I would like her to come back sooner than that if she would like to go over the MRI.

## 2022-08-12 ENCOUNTER — Ambulatory Visit: Payer: BC Managed Care – PPO | Admitting: Rehabilitative and Restorative Service Providers"

## 2022-08-12 ENCOUNTER — Encounter: Payer: Self-pay | Admitting: Rehabilitative and Restorative Service Providers"

## 2022-08-12 DIAGNOSIS — R262 Difficulty in walking, not elsewhere classified: Secondary | ICD-10-CM

## 2022-08-12 DIAGNOSIS — M6281 Muscle weakness (generalized): Secondary | ICD-10-CM | POA: Diagnosis not present

## 2022-08-12 DIAGNOSIS — M79605 Pain in left leg: Secondary | ICD-10-CM | POA: Diagnosis not present

## 2022-08-12 NOTE — Therapy (Signed)
OUTPATIENT PHYSICAL THERAPY TREATMENT NOTE   Patient Name: Robin [name] MRN: [id number] DOB:24-Jun-1971, 51 y.o., female Today's Date: 08/12/2022  END OF SESSION:   PT End of Session - 08/12/22 1352     Visit Number 5    Number of Visits 20    Date for PT Re-Evaluation 10/04/22    Authorization Type [employer] [id number] copay    Authorization - Visit Number 5    Authorization - Number of Visits 19    PT Start Time 5093    PT Stop Time 1429    PT Time Calculation (min) 38 min    Activity Tolerance Patient tolerated treatment well    Behavior During Therapy WFL for tasks assessed/performed               Past Medical History:  Diagnosis Date   ADD (attention deficit disorder)    Allergy    Anemia    Anorexia nervosa with bulimia    Per [place] [facility]   Anxiety    Asthma    Carpal tunnel syndrome    Per [place] [facility]   Depression    Dry eyes    Per [place] [facility]   Dry mouth    Per [place] [facility]   Fibroids    Per [place] [facility]   History of hip x-ray 2021   Per [place] [facility]   History of Papanicolaou smear of cervix 2021   Per [place] [facility], Dr.Pickens   Hyperlipidemia    Ileitis    Lupus ([place])    PONV (postoperative nausea and vomiting)    mild nausea    Raynaud disease    Retinal tear    Per [facility] Patient Packet   Systemic lupus erythematosus ([place])    Past Surgical History:  Procedure Laterality Date   CARPAL TUNNEL RELEASE Right    08/2019   CESAREAN SECTION  2003   CESAREAN SECTION  2005   Per [facility] Patient Packet   COLONOSCOPY  2017   Dr.Jacobs   cyst drained Left 2023   knee   DENTAL SURGERY  2009   Fromed tooth and chronic infection   DILITATION & CURRETTAGE/HYSTROSCOPY WITH NOVASURE ABLATION N/A 06/03/2020   Procedure: DILATATION & CURETTAGE/HYSTEROSCOPY WITH NOVASURE ABLATION;  Surgeon: Aletha Halim, MD;  Location: [place];  Service: Gynecology;  Laterality:  N/A;   ENDOMETRIAL BIOPSY  04/20/2020       EYE SURGERY Bilateral    cryoretinopexy   Patient Active Problem List   Diagnosis Date Noted   Left hamstring injury 07/15/2022   Labral tear of shoulder, degenerative, right 06/21/2022   Left carpal tunnel syndrome 05/26/2022   Foot sprain, left, initial encounter 05/19/2022   Baker's cyst of knee, left 04/21/2022   Right carpal tunnel syndrome 03/16/2022   Flushing 04/14/2021   Hypermobility syndrome 11/27/2020   Seasonal and perennial allergic rhinitis 11/06/2018   Mild intermittent asthma without complication 26/71/2458   Flexural atopic dermatitis 11/06/2018   History of hyperlipidemia 11/08/2016   History of anxiety 11/08/2016   History of retinal tear 11/08/2016   History of Bilateral plantar fasciitis 08/10/2016   High risk medication use 07/04/2016   Lupus ([place]) 05/23/2016   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[place]) 03/18/2016   ADHD (attention deficit hyperactivity disorder) 12/22/2014   Raynaud's phenomenon 10/17/2013   Hyperlipidemia 09/28/2011     THERAPY DIAG:  Pain in left leg  Muscle weakness (  generalized)  Difficulty in walking, not elsewhere classified  PCP: Ngetich, Nelda Bucks NP   REFERRING PROVIDER: Leandrew Koyanagi, MD   REFERRING DIAG: [phone] (ICD-10-CM) - Left hamstring injury, initial encounter    Rationale for Evaluation and Treatment: Rehabilitation   ONSET DATE: Around 07/13/2022 for hamstring, several years for other complaints   SUBJECTIVE:    SUBJECTIVE STATEMENT: Pt indicated she felt like back had improvement "less reactive."  Pt indicated a little more tender in back of Lt leg today.   Requested dry needling today.    PERTINENT HISTORY: Lt hamstring tear grade 1. Bakers Cyst per MD note.  Med history : lupus   PAIN:  Pain location: 5/10 in thigh description: weakness, strain      hip/SI joint: tenderness, tightness, spasms Aggravating factors: walking faster uphill, full weight on leg hurts thigh,   hip: walking at times, various pressures in area Relieving factors: taping, heat, OTC medicine as necessary   PRECAUTIONS: None   WEIGHT BEARING RESTRICTIONS: No   FALLS:  Has patient fallen in last 6 months? Yes 2x - Outside of house Pt indicated a fear of falling.     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  Heal hamstring completely     OBJECTIVE:      PATIENT SURVEYS:  07/26/2022 FOTO intake:   31 predicted:  58   COGNITION: 07/26/2022 Overall cognitive status: WFL                        SENSATION: 07/26/2022 WFL   MUSCLE LENGTH: 07/26/2022 Hamstrings passive SLR supine: Right 90 deg; Left 70 deg c pain   POSTURE:  07/26/2022 unremarkable in standing   PALPATION: 07/26/2022 Tenderness noted to light touch in Lt lumbar paraspinals   ROM:    ROM 07/26/2022 Right 07/26/2022 Left 07/26/2022 08/09/2022  Hip flexion         Hip extension         Hip abduction         Hip adduction         Hip internal rotation         Hip external rotation         Knee flexion         Knee extension                   Lumbar extension 75% WFL c end range back complaints   Repeated x 5 in standing c ERP worsened     75% WFL c tightness lower back (more on Lt)  Lumbar flexion To floor no complaints     Hands flat to floor             (Blank rows = not tested)   LOWER EXTREMITY MMT:   MMT Right 07/26/2022 Left 07/26/2022 Left 07/28/22 Left 08/09/2022  Hip flexion 5/5 5/5    Hip extension Check next visit Check next visit 4/5   Hip abduction Check next visit Check next visit 4/5   Hip adduction        Hip internal rotation        Hip external rotation        Knee flexion 5/5  39, 36.4 lbs   3+/5 14 lbs With  pain   4/5 20.6, 18.4 lbs  Knee extension 5/5 5/5    Ankle dorsiflexion 5/5 5/5    Ankle plantarflexion        Ankle inversion        Ankle eversion         (Blank rows = not tested)   SPECIAL TESTS:  07/26/2022 Lt slump test positive for complaints in thigh and calf, Negative on Rt Negative crossed slr bilateral for any radicular pain   FUNCTIONAL TESTS:  07/26/2022 18 inch chair transfer:  1st try without hands Lt SLS:  10 seconds, stopped due to back related complaints Rt SLS:  30 seconds c mild aberrant movement   GAIT: 07/26/2022 Independent ambulation      TODAY'S TREATMENT  08/12/2022 Therex Recumbent bike  Rt sidelying Lt lumbar rotation stretch 15 sec x 3 Machine leg curls double leg down, Lt leg up slowly 3 x 10 15 lbs  Single leg RDL to tolerance x 10 - bilateral  Manual therapy for skilled palpation and Trigger Point Dry-Needling  Treatment instructions: Expect mild to moderate muscle soreness. Patient Consent Given: Yes Education handout provided: previously Muscles treated: Lt and Rt inferior lumbar paraspinals, Lt hamstring middle third medially and laterally Treatment response/outcome: good overall tolerance,twitch response noted   Moist heat provided to back while performing needling on hamstring.  Then hamstring for a few minutes after dry needling hamstring c active prone hamstring curls.   08/09/2022 Manual therapy for skilled palpation and Trigger Point Dry-Needling  Treatment instructions: Expect mild to moderate muscle soreness. Patient Consent Given: Yes Education handout provided: previously Muscles treated: Lt and Rt inferior lumbar paraspinals, Lt QL Treatment response/outcome: good overall tolerance,twitch response noted   Therex Reviewed HEP verbally c handout provided today with changes.  Advised use of just HEP to reduce amount of work at home in next week.  Rt sidelying Lt lumbar rotation stretch  15 sec x 3 Prone opposite arm/leg lifts 3 sec hold x 10 bilateral Machine leg curls double leg down, Lt leg up slowly x 10 10 lbs, x 10 15 lbs    08/01/22 Manual therapy for skilled palpation and Trigger Point Dry-Needling  Treatment instructions: Expect mild to moderate muscle soreness. Patient Consent Given: Yes Education handout provided: verbally provided Muscles treated: Left hamstrings, glutes and lumbar paraspinals and multifidi,  Treatment response/outcome: good overall tolerance,twitch response noted   Combined with Estim both micro current at 100 frequency for pain/spasm, and milli current at 2 frequency for muscle contraction/fatigue to relax muscles     PATIENT EDUCATION:   07/26/2022 Education details: HEP, POC Person educated: Patient Education method: Consulting civil engineer, Demonstration, Verbal cues, and Handouts Education comprehension: verbalized understanding, returned demonstration, and verbal cues required   HOME EXERCISE PROGRAM: Access Code: [email or web address] URL: [email or web address] Date: 08/09/2022 Prepared by: Scot Jun  Exercises - Supine Bridge  - 1-2 x daily - 7 x weekly - 1-2 sets - 10 reps - 2 hold - Supine 90/90 Sciatic Nerve Glide with Knee Flexion/Extension  - 1-2 x daily - 7 x weekly - 1-2 sets - 10 reps - Sidelying Hip Abduction  - 1-2 x daily - 7 x weekly - 2-3 sets - 10-15 reps - Standing Hip Hiking  - 1-2 x daily - 7 x weekly - 1 sets - 10 reps - 5 hold - Seated Hamstring Set (Mirrored)  - 2-3 x daily - 7 x weekly - 1  sets - 10-15 reps - 5-10 hold - Prone Alternating Arm and Leg Lifts  - 1-2 x daily - 7 x weekly - 1-2 sets - 10 reps - 3 hold - Sidelying Lumbar Rotation Stretch (Mirrored)  - 2-3 x daily - 7 x weekly - 1 sets - 5 reps - 15 hold   ASSESSMENT:   CLINICAL IMPRESSION: Fair control in single leg RDL due to weakness and instability, noted on both sides.  Positive response from last visit dry needling in back so repeated today  and added hamstring (concordant twitch response noted).  Continued skilled PT services indicated to progress hamstring strength and overall pain symptom reductions.   OBJECTIVE IMPAIRMENTS: decreased activity tolerance, decreased balance, decreased coordination, decreased endurance, decreased mobility, difficulty walking, decreased ROM, decreased strength, increased fascial restrictions, impaired perceived functional ability, increased muscle spasms, impaired flexibility, improper body mechanics, and pain.    ACTIVITY LIMITATIONS: carrying, lifting, bending, standing, squatting, stairs, transfers, and locomotion level   PARTICIPATION LIMITATIONS: cleaning, interpersonal relationship, community activity, occupation, yard work, and exercise routine, walking dog   PERSONAL FACTORS: Time since onset of injury/illness/exacerbation and multiple treatment areas, lupus  are also affecting patient's functional outcome.    REHAB POTENTIAL: Good   CLINICAL DECISION MAKING: Stable/uncomplicated   EVALUATION COMPLEXITY: Low     GOALS: Goals reviewed with patient? Yes   SHORT TERM GOALS: (target date for Short term goals are 3 weeks 08/16/2022)    1.  Patient will demonstrate independent use of home exercise program to maintain progress from in clinic treatments.   Goal status: on going 08/09/2022   LONG TERM GOALS: (target dates for all long term goals are 10 weeks  10/04/2022 )   1. Patient will demonstrate/report pain at worst less than or equal to 2/10 to facilitate minimal limitation in daily activity secondary to pain symptoms.   Goal status: New   2. Patient will demonstrate independent use of home exercise program to facilitate ability to maintain/progress functional gains from skilled physical therapy services.   Goal status: New   3. Patient will demonstrate FOTO outcome > or = 58 % to indicate reduced disability due to condition.   Goal status: New   4.  Patient will demonstrate Lt  LE MMT 5/5 , dynamometry within 15 % of Rt throughout to faciltiate usual transfers, stairs, squatting at [facility] for daily life.    Goal status: New   5.  Patient will demonstrate Lt passive hamstring SLR to 90 deg equal to Rt s symptoms for normal mobility.  Goal status: New   6.  Patient will demonstrate bilateral SLS > 30 seconds to facilitate stability in ambulation on even and uneven ground.  Goal status: New   7.  Patient will demonstrate lumbar AROM WFL s symptoms to facilitate usual mobility at PLOF in standing/walking.  Goal Status: New     PLAN:   PT FREQUENCY: 1-2x/week   PT DURATION: 10 weeks   PLANNED INTERVENTIONS: Therapeutic exercises, Therapeutic activity, Neuro Muscular re-education, Balance training, Gait training, Patient/Family education, Joint mobilization, Stair training, DME instructions, Dry Needling, Electrical stimulation, Traction, Cryotherapy, vasopneumatic deviceMoist heat, Taping, Ultrasound, Ionotophoresis 16m/ml Dexamethasone, and Manual therapy.  All included unless contraindicated   PLAN FOR NEXT SESSION: DN as desired in back/hamstring.   MScot Jun PT, DPT, OCS, ATC 08/12/22  2:38 PM

## 2022-08-16 ENCOUNTER — Ambulatory Visit: Payer: BC Managed Care – PPO | Admitting: Rehabilitative and Restorative Service Providers"

## 2022-08-16 ENCOUNTER — Encounter: Payer: Self-pay | Admitting: Rehabilitative and Restorative Service Providers"

## 2022-08-16 ENCOUNTER — Ambulatory Visit: Payer: BC Managed Care – PPO | Admitting: Orthopaedic Surgery

## 2022-08-16 DIAGNOSIS — M25511 Pain in right shoulder: Secondary | ICD-10-CM

## 2022-08-16 DIAGNOSIS — R262 Difficulty in walking, not elsewhere classified: Secondary | ICD-10-CM | POA: Diagnosis not present

## 2022-08-16 DIAGNOSIS — M6281 Muscle weakness (generalized): Secondary | ICD-10-CM | POA: Diagnosis not present

## 2022-08-16 DIAGNOSIS — G8929 Other chronic pain: Secondary | ICD-10-CM

## 2022-08-16 DIAGNOSIS — M79605 Pain in left leg: Secondary | ICD-10-CM

## 2022-08-16 NOTE — Progress Notes (Signed)
Office Visit Note   Patient: Robin [name]           Date of Birth: December 23, 1970           MRN: [id number] Visit Date: 08/16/2022              Requested by: Robin Hughs, NP [address] [place],  [place] [address] PCP: Robin Hughs, NP   Assessment & Plan: Visit Diagnoses:  1. Chronic right shoulder pain     Plan: MRI shows moderate tendinosis and partial-thickness tearing of the supraspinatus.  Remaining portions of the MRI are unremarkable.  Based on findings she would like to try intra-articular steroid injection.  We will order this.  I will also make a referral for physical therapy.  We will see her back as needed.  Follow-Up Instructions: No follow-ups on file.   Orders:  Orders Placed This Encounter  Procedures   Ambulatory referral to Physical Therapy   AMB referral to sports medicine   No orders of the defined types were placed in this encounter.     Procedures: No procedures performed   Clinical Data: No additional findings.   Subjective: Chief Complaint  Patient presents with   Right Shoulder - Pain    HPI Robin [name] returns today to review right shoulder MRI scan. Review of Systems   Objective: Vital Signs: There were no vitals taken for this visit.  Physical Exam  Ortho Exam Shoulder exam stable. Specialty Comments:  No specialty comments available.  Imaging: No results found.   PMFS History: Patient Active Problem List   Diagnosis Date Noted   Left hamstring injury 07/15/2022   Labral tear of shoulder, degenerative, right 06/21/2022   Left carpal tunnel syndrome 05/26/2022   Foot sprain, left, initial encounter 05/19/2022   Baker's cyst of knee, left 04/21/2022   Right carpal tunnel syndrome 03/16/2022   Flushing 04/14/2021   Hypermobility syndrome 11/27/2020   Seasonal and perennial allergic rhinitis 11/06/2018   Mild intermittent asthma without complication 84/66/5993   Flexural atopic dermatitis 11/06/2018   History  of hyperlipidemia 11/08/2016   History of anxiety 11/08/2016   History of retinal tear 11/08/2016   History of Bilateral plantar fasciitis 08/10/2016   High risk medication use 07/04/2016   Lupus ([place]) 05/23/2016   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[place]) 03/18/2016   ADHD (attention deficit hyperactivity disorder) 12/22/2014   Raynaud's phenomenon 10/17/2013   Hyperlipidemia 09/28/2011   Past Medical History:  Diagnosis Date   ADD (attention deficit disorder)    Allergy    Anemia    Anorexia nervosa with bulimia    Per [place] [facility]   Anxiety    Asthma    Carpal tunnel syndrome    Per [place] [facility] Packet   Depression    Dry eyes    Per [place] [facility] Patient Packet   Dry mouth    Per [place] [facility] Patient Packet   Fibroids    Per [place] [facility] Packet   History of hip x-ray 2021   Per [place] [facility] Patient Packet   History of Papanicolaou smear of cervix 2021   Per [place] [facility] Patient Packet, Dr.Pickens   Hyperlipidemia    Ileitis    Lupus ([place])    PONV (postoperative nausea and vomiting)    mild nausea    Raynaud disease    Retinal tear    Per [facility] Patient Packet   Systemic lupus erythematosus ([place])     Family  History  Problem Relation Age of Onset   Heart disease Mother 4       CAD/stenting   Hyperlipidemia Mother    Arthritis Mother        ostearthritis   Hypertension Mother    Kidney disease Mother    Irritable bowel syndrome Mother    Stroke Mother 75       CVA   Fibromyalgia Mother    Eczema Mother    Sjogren's syndrome Mother    Hyperlipidemia Father    Neuropathy Father    Allergies Father        shellfish   Prostatitis Father    Hyperlipidemia Sister    ADD / ADHD Sister    Bipolar disorder Sister    Depression Sister    Hypertension Brother    OCD Brother    Anxiety disorder Daughter    ADD / ADHD Daughter    Asthma Daughter    OCD Son    Colon cancer Neg Hx    Inflammatory bowel disease Neg Hx    Allergic rhinitis Neg Hx    Angioedema Neg Hx    Atopy  Neg Hx    Immunodeficiency Neg Hx    Urticaria Neg Hx     Past Surgical History:  Procedure Laterality Date   CARPAL TUNNEL RELEASE Right    08/2019   CESAREAN SECTION  2003   CESAREAN SECTION  2005   Per [facility] Patient Packet   COLONOSCOPY  2017   Dr.Jacobs   cyst drained Left 2023   knee   DENTAL SURGERY  2009   Fromed tooth and chronic infection   DILITATION & CURRETTAGE/HYSTROSCOPY WITH NOVASURE ABLATION N/A 06/03/2020   Procedure: DILATATION & CURETTAGE/HYSTEROSCOPY WITH NOVASURE ABLATION;  Surgeon: Aletha Halim, MD;  Location: [place];  Service: Gynecology;  Laterality: N/A;   ENDOMETRIAL BIOPSY  04/20/2020       EYE SURGERY Bilateral    cryoretinopexy   Social History   Occupational History   Not on file  Tobacco Use   Smoking status: Former    Packs/day: 0.75    Years: 10.00    Total pack years: 7.50    Types: Cigarettes    Quit date: 07/05/2001    Years since quitting: 21.1    Passive exposure: Never   Smokeless tobacco: Never  Vaping Use   Vaping Use: Never used  Substance and Sexual Activity   Alcohol use: Yes    Comment: occ   Drug use: Never   Sexual activity: Yes    Birth control/protection: Condom

## 2022-08-16 NOTE — Therapy (Signed)
OUTPATIENT PHYSICAL THERAPY TREATMENT NOTE   Patient Name: Robin [name] MRN: [id number] DOB:Jan 13, 1971, 51 y.o., female Today's Date: 08/16/2022  END OF SESSION:   PT End of Session - 08/16/22 0852     Visit Number 6    Number of Visits 20    Date for PT Re-Evaluation 10/04/22    Authorization Type [employer] $40 copay    Authorization - Number of Visits 19    PT Start Time [phone]    PT Stop Time 0930    PT Time Calculation (min) 38 min    Activity Tolerance Patient tolerated treatment well    Behavior During Therapy WFL for tasks assessed/performed                Past Medical History:  Diagnosis Date   ADD (attention deficit disorder)    Allergy    Anemia    Anorexia nervosa with bulimia    Per [place] New Patient Packet   Anxiety    Asthma    Carpal tunnel syndrome    Per [place] New Patient Packet   Depression    Dry eyes    Per [place] New Patient Packet   Dry mouth    Per [place] New Patient Packet   Fibroids    Per [facility] New Patient Packet   History of hip x-ray 2021   Per [place] New Patient Packet   History of Papanicolaou smear of cervix 2021   Per [place] New Patient Packet, Dr.Pickens   Hyperlipidemia    Ileitis    Lupus ([place])    PONV (postoperative nausea and vomiting)    mild nausea    Raynaud disease    Retinal tear    Per [facility] New Patient Packet   Systemic lupus erythematosus ([place])    Past Surgical History:  Procedure Laterality Date   CARPAL TUNNEL RELEASE Right    08/2019   CESAREAN SECTION  2003   CESAREAN SECTION  2005   Per [facility] New Patient Packet   COLONOSCOPY  2017   Dr.Jacobs   cyst drained Left 2023   knee   DENTAL SURGERY  2009   Fromed tooth and chronic infection   DILITATION & CURRETTAGE/HYSTROSCOPY WITH NOVASURE ABLATION N/A 06/03/2020   Procedure: DILATATION & CURETTAGE/HYSTEROSCOPY WITH NOVASURE ABLATION;  Surgeon: Aletha Halim, MD;  Location: [place];  Service: Gynecology;  Laterality: N/A;   ENDOMETRIAL BIOPSY   04/20/2020       EYE SURGERY Bilateral    cryoretinopexy   Patient Active Problem List   Diagnosis Date Noted   Left hamstring injury 07/15/2022   Labral tear of shoulder, degenerative, right 06/21/2022   Left carpal tunnel syndrome 05/26/2022   Foot sprain, left, initial encounter 05/19/2022   Baker's cyst of knee, left 04/21/2022   Right carpal tunnel syndrome 03/16/2022   Flushing 04/14/2021   Hypermobility syndrome 11/27/2020   Seasonal and perennial allergic rhinitis 11/06/2018   Mild intermittent asthma without complication 28/41/3244   Flexural atopic dermatitis 11/06/2018   History of hyperlipidemia 11/08/2016   History of anxiety 11/08/2016   History of retinal tear 11/08/2016   History of Bilateral plantar fasciitis 08/10/2016   High risk medication use 07/04/2016   Lupus ([place]) 05/23/2016   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[place]) 03/18/2016   ADHD (attention deficit hyperactivity disorder) 12/22/2014   Raynaud's phenomenon 10/17/2013   Hyperlipidemia 09/28/2011     THERAPY DIAG:  Pain in left leg  Muscle weakness (generalized)  Difficulty in walking, not elsewhere  classified  PCP: Ngetich, Nelda Bucks NP   REFERRING PROVIDER: Leandrew Koyanagi, MD   REFERRING DIAG: [phone] (ICD-10-CM) - Left hamstring injury, initial encounter    Rationale for Evaluation and Treatment: Rehabilitation   ONSET DATE: Around 07/13/2022 for hamstring, several years for other complaints   SUBJECTIVE:    SUBJECTIVE STATEMENT: Pt indicated feeling soreness from last visit but better in 24 hours.  Pt indicated doing well and feeling stronger.  Pt indicated pretty low pain overall but did notice some complaints of stiffness c prolonged standing (noted in back, hip and thigh).    PERTINENT HISTORY: Lt hamstring tear grade 1. Bakers Cyst per MD note.  Med history : lupus   PAIN:  Pain location: 3/10 in back, 1/10 in hamstring description: weakness, strain      hip/SI joint: tenderness, tightness,  spasms Aggravating factors: prolonged standing Relieving factors: taping, stretching, previous treatment   PRECAUTIONS: None   WEIGHT BEARING RESTRICTIONS: No   FALLS:  Has patient fallen in last 6 months? Yes 2x - Outside of house Pt indicated a fear of falling.     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  Heal hamstring completely     OBJECTIVE:      PATIENT SURVEYS:  08/16/2022: FOTO update 49  07/26/2022 FOTO intake:31 predicted:  5   COGNITION: 07/26/2022 Overall cognitive status: WFL                        SENSATION: 07/26/2022 WFL   MUSCLE LENGTH: 07/26/2022 Hamstrings passive SLR supine: Right 90 deg; Left 70 deg c pain   POSTURE:  07/26/2022 unremarkable in standing   PALPATION: 07/26/2022 Tenderness noted to light touch in Lt lumbar paraspinals   ROM:    ROM 07/26/2022 Right 07/26/2022 Left 07/26/2022 08/09/2022  Hip flexion         Hip extension         Hip abduction         Hip adduction         Hip internal rotation         Hip external rotation         Knee flexion         Knee extension                   Lumbar extension 75% WFL c end range back complaints   Repeated x 5 in standing c ERP worsened     75% WFL c tightness lower back (more on Lt)  Lumbar flexion To floor no complaints     Hands flat to floor             (Blank rows = not tested)   LOWER EXTREMITY MMT:   MMT Right 07/26/2022 Left 07/26/2022 Left 07/28/22 Left 08/09/2022  Hip flexion 5/5 5/5    Hip extension Check next visit Check next visit 4/5   Hip abduction Check next visit Check next visit 4/5   Hip adduction        Hip internal rotation        Hip external rotation         Knee flexion 5/5   39, 36.4 lbs  3+/5 14 lbs With  pain   4/5 20.6, 18.4 lbs  Knee extension 5/5 5/5    Ankle dorsiflexion 5/5 5/5    Ankle plantarflexion        Ankle inversion        Ankle eversion         (Blank rows = not tested)   SPECIAL TESTS:  07/26/2022 Lt slump test positive for complaints in thigh and calf, Negative on Rt Negative crossed slr bilateral for any radicular pain   FUNCTIONAL TESTS:  08/16/2022:  Lt SLS:  18 seconds  07/26/2022 18 inch chair transfer:  1st try without hands Lt SLS:  10 seconds, stopped due to back related complaints Rt SLS:  30 seconds c mild aberrant movement   GAIT: 07/26/2022 Independent ambulation      TODAY'S TREATMENT  08/16/2022 Therex: Recumbent bike Lvl 1 5 mins Leg press single leg 50 lbs 2 x 15 bilateral Machine leg curls double leg down, Lt leg up slowly  x 15 15 lbs , x 15 20 lbs  Hip hike in doorway 5 sec hold x 5 - no UE assist for balance (cues for techniques verbally and visually)  Manual therapy for skilled palpation and Trigger Point Dry-Needling  Treatment instructions: Expect mild to moderate muscle soreness. Patient Consent Given: Yes Education handout provided: previously Muscles treated: Lt QL, Lt hamstring middle third medially and laterally Treatment response/outcome: good overall tolerance,twitch response noted    08/12/2022 Therex  Rt sidelying Lt lumbar rotation stretch 15 sec x 3 Machine leg curls double leg down, Lt leg up slowly 3 x 10 15 lbs  Single leg RDL to tolerance x 10 - bilateral  Manual therapy for skilled palpation and Trigger Point Dry-Needling  Treatment instructions: Expect mild to moderate muscle soreness. Patient Consent Given: Yes Education handout provided: previously Muscles treated: Lt and Rt inferior lumbar paraspinals, Lt hamstring middle third medially and laterally Treatment response/outcome: good overall tolerance,twitch response noted   Moist heat provided to  back while performing needling on hamstring.  Then hamstring for a few minutes after dry needling hamstring c active prone hamstring curls.   08/09/2022 Manual therapy for skilled palpation and Trigger Point Dry-Needling  Treatment instructions: Expect mild to moderate muscle soreness. Patient Consent Given: Yes Education handout provided: previously Muscles treated: Lt and Rt inferior lumbar paraspinals, Lt QL Treatment response/outcome: good overall tolerance,twitch response noted   Therex Reviewed HEP verbally c handout provided today with changes.  Advised use of just HEP to reduce amount of work at home in next week.  Rt sidelying Lt lumbar rotation stretch 15 sec x 3 Prone opposite arm/leg lifts 3 sec hold x 10 bilateral Machine leg curls double leg down, Lt leg up slowly x 10 10 lbs, x 10 15 lbs     PATIENT EDUCATION:   07/26/2022 Education details: HEP, POC Person educated: Patient Education method: Consulting civil engineer, Media planner, Verbal cues, and Handouts Education comprehension: verbalized understanding, returned demonstration, and verbal cues required   HOME EXERCISE PROGRAM: Access Code: [email or web address] URL: [email or web address] Date: 08/09/2022 Prepared by: Scot Jun  Exercises - Supine Bridge  - 1-2 x daily - 7 x weekly - 1-2 sets - 10 reps - 2 hold - Supine 90/90 Sciatic Nerve Glide with Knee Flexion/Extension  - 1-2 x daily - 7 x weekly - 1-2 sets - 10 reps - Sidelying Hip Abduction  - 1-2 x daily - 7 x weekly - 2-3 sets - 10-15 reps - Standing Hip  Hiking  - 1-2 x daily - 7 x weekly - 1 sets - 10 reps - 5 hold - Seated Hamstring Set (Mirrored)  - 2-3 x daily - 7 x weekly - 1 sets - 10-15 reps - 5-10 hold - Prone Alternating Arm and Leg Lifts  - 1-2 x daily - 7 x weekly - 1-2 sets - 10 reps - 3 hold - Sidelying Lumbar Rotation Stretch (Mirrored)  - 2-3 x daily - 7 x weekly - 1 sets - 5 reps - 15 hold   ASSESSMENT:   CLINICAL IMPRESSION: Continued  manual and dry needling to reduce symptoms in affected areas per Pt request and report of improvements in last few visits.   Continued strengthening program to help improve Lt leg strength and control in daily activity.  Plan to recheck strength next visit  OBJECTIVE IMPAIRMENTS: decreased activity tolerance, decreased balance, decreased coordination, decreased endurance, decreased mobility, difficulty walking, decreased ROM, decreased strength, increased fascial restrictions, impaired perceived functional ability, increased muscle spasms, impaired flexibility, improper body mechanics, and pain.    ACTIVITY LIMITATIONS: carrying, lifting, bending, standing, squatting, stairs, transfers, and locomotion level   PARTICIPATION LIMITATIONS: cleaning, interpersonal relationship, community activity, occupation, yard work, and exercise routine, walking dog   PERSONAL FACTORS: Time since onset of injury/illness/exacerbation and multiple treatment areas, lupus  are also affecting patient's functional outcome.    REHAB POTENTIAL: Good   CLINICAL DECISION MAKING: Stable/uncomplicated   EVALUATION COMPLEXITY: Low     GOALS: Goals reviewed with patient? Yes   SHORT TERM GOALS: (target date for Short term goals are 3 weeks 08/16/2022)    1.  Patient will demonstrate independent use of home exercise program to maintain progress from in clinic treatments.   Goal status: Met 08/16/2022   LONG TERM GOALS: (target dates for all long term goals are 10 weeks  10/04/2022 )   1. Patient will demonstrate/report pain at worst less than or equal to 2/10 to facilitate minimal limitation in daily activity secondary to pain symptoms.   Goal status: on going 08/16/2022   2. Patient will demonstrate independent use of home exercise program to facilitate ability to maintain/progress functional gains from skilled physical therapy services.   Goal status: on going 08/16/2022   3. Patient will demonstrate FOTO outcome >  or = 58 % to indicate reduced disability due to condition.   Goal status: on going 08/16/2022   4.  Patient will demonstrate Lt LE MMT 5/5 , dynamometry within 15 % of Rt throughout to faciltiate usual transfers, stairs, squatting at [facility] for daily life.    Goal status: on going 08/16/2022   5.  Patient will demonstrate Lt passive hamstring SLR to 90 deg equal to Rt s symptoms for normal mobility.  Goal status: on going 08/16/2022   6.  Patient will demonstrate bilateral SLS > 30 seconds to facilitate stability in ambulation on even and uneven ground.  Goal status: on going 08/16/2022   7.  Patient will demonstrate lumbar AROM WFL s symptoms to facilitate usual mobility at PLOF in standing/walking.  Goal Status: on going 08/16/2022     PLAN:   PT FREQUENCY: 1-2x/week   PT DURATION: 10 weeks   PLANNED INTERVENTIONS: Therapeutic exercises, Therapeutic activity, Neuro Muscular re-education, Balance training, Gait training, Patient/Family education, Joint mobilization, Stair training, DME instructions, Dry Needling, Electrical stimulation, Traction, Cryotherapy, vasopneumatic deviceMoist heat, Taping, Ultrasound, Ionotophoresis 78m/ml Dexamethasone, and Manual therapy.  All included unless contraindicated   PLAN FOR NEXT SESSION:  DN as desired in back/hamstring.  Progressive LE strengthening.  Check dynamometry strength.    Scot Jun, PT, DPT, OCS, ATC 08/16/22  9:33 AM

## 2022-08-18 ENCOUNTER — Encounter: Payer: BC Managed Care – PPO | Admitting: Orthopaedic Surgery

## 2022-08-22 ENCOUNTER — Other Ambulatory Visit: Payer: Self-pay | Admitting: Allergy & Immunology

## 2022-08-22 ENCOUNTER — Telehealth: Payer: Self-pay | Admitting: Orthopaedic Surgery

## 2022-08-22 NOTE — Telephone Encounter (Signed)
Robin [name] states she need to change the date of her surgery and not have anything till the first of the year. Her surgery is scheduled to be don on 09/01/2022 with Dr. Erlinda Hong

## 2022-08-23 ENCOUNTER — Ambulatory Visit: Payer: BC Managed Care – PPO | Admitting: Sports Medicine

## 2022-08-23 ENCOUNTER — Encounter: Payer: Self-pay | Admitting: Physical Therapy

## 2022-08-23 ENCOUNTER — Ambulatory Visit: Payer: BC Managed Care – PPO | Admitting: Physical Therapy

## 2022-08-23 ENCOUNTER — Encounter: Payer: Self-pay | Admitting: Sports Medicine

## 2022-08-23 ENCOUNTER — Ambulatory Visit: Payer: Self-pay

## 2022-08-23 DIAGNOSIS — M357 Hypermobility syndrome: Secondary | ICD-10-CM

## 2022-08-23 DIAGNOSIS — M79605 Pain in left leg: Secondary | ICD-10-CM

## 2022-08-23 DIAGNOSIS — G8929 Other chronic pain: Secondary | ICD-10-CM | POA: Diagnosis not present

## 2022-08-23 DIAGNOSIS — M25572 Pain in left ankle and joints of left foot: Secondary | ICD-10-CM

## 2022-08-23 DIAGNOSIS — M25511 Pain in right shoulder: Secondary | ICD-10-CM

## 2022-08-23 DIAGNOSIS — R262 Difficulty in walking, not elsewhere classified: Secondary | ICD-10-CM | POA: Diagnosis not present

## 2022-08-23 DIAGNOSIS — M6281 Muscle weakness (generalized): Secondary | ICD-10-CM | POA: Diagnosis not present

## 2022-08-23 MED ORDER — METHYLPREDNISOLONE ACETATE 40 MG/ML IJ SUSP
80.0000 mg | INTRAMUSCULAR | Status: AC | PRN
  Administered 2022-08-23: 80 mg via INTRA_ARTICULAR

## 2022-08-23 MED ORDER — LIDOCAINE HCL 1 % IJ SOLN
2.0000 mL | INTRAMUSCULAR | Status: AC | PRN
  Administered 2022-08-23: 2 mL

## 2022-08-23 MED ORDER — BUPIVACAINE HCL 0.25 % IJ SOLN
2.0000 mL | INTRAMUSCULAR | Status: AC | PRN
  Administered 2022-08-23: 2 mL via INTRA_ARTICULAR

## 2022-08-23 NOTE — Therapy (Signed)
OUTPATIENT PHYSICAL THERAPY TREATMENT NOTE/RE-Evaluation to add Rt shoulder per new MD referral   Patient Name: Robin [name] MRN: [id number] DOB:Feb 20, 1971, 51 y.o., female Today's Date: 08/23/2022  END OF SESSION:   PT End of Session - 08/23/22 1356     Visit Number 7    Number of Visits 20    Date for PT Re-Evaluation 10/04/22    Authorization Type [employer] [id number] copay    Authorization - Number of Visits 19    PT Start Time 1100    PT Stop Time 1145    PT Time Calculation (min) 45 min    Activity Tolerance Patient tolerated treatment well    Behavior During Therapy WFL for tasks assessed/performed                 Past Medical History:  Diagnosis Date   ADD (attention deficit disorder)    Allergy    Anemia    Anorexia nervosa with bulimia    Per [place] New Patient Packet   Anxiety    Asthma    Carpal tunnel syndrome    Per [place] New Patient Packet   Depression    Dry eyes    Per [place] New Patient Packet   Dry mouth    Per [place] [facility] Patient Packet   Fibroids    Per [facility] New Patient Packet   History of hip x-ray 2021   Per [place] New Patient Packet   History of Papanicolaou smear of cervix 2021   Per [place] New Patient Packet, Dr.Pickens   Hyperlipidemia    Ileitis    Lupus ([place])    PONV (postoperative nausea and vomiting)    mild nausea    Raynaud disease    Retinal tear    Per [facility] New Patient Packet   Systemic lupus erythematosus ([place])    Past Surgical History:  Procedure Laterality Date   CARPAL TUNNEL RELEASE Right    08/2019   CESAREAN SECTION  2003   CESAREAN SECTION  2005   Per [facility] New Patient Packet   COLONOSCOPY  2017   Dr.Jacobs   cyst drained Left 2023   knee   DENTAL SURGERY  2009   Fromed tooth and chronic infection   DILITATION & CURRETTAGE/HYSTROSCOPY WITH NOVASURE ABLATION N/A 06/03/2020   Procedure: DILATATION & CURETTAGE/HYSTEROSCOPY WITH NOVASURE ABLATION;  Surgeon: Aletha Halim, MD;  Location: [place];  Service:  Gynecology;  Laterality: N/A;   ENDOMETRIAL BIOPSY  04/20/2020       EYE SURGERY Bilateral    cryoretinopexy   Patient Active Problem List   Diagnosis Date Noted   Left hamstring injury 07/15/2022   Labral tear of shoulder, degenerative, right 06/21/2022   Left carpal tunnel syndrome 05/26/2022   Foot sprain, left, initial encounter 05/19/2022   Baker's cyst of knee, left 04/21/2022   Right carpal tunnel syndrome 03/16/2022   Flushing 04/14/2021   Hypermobility syndrome 11/27/2020   Seasonal and perennial allergic rhinitis 11/06/2018   Mild intermittent asthma without complication 44/11/4740   Flexural atopic dermatitis 11/06/2018   History of hyperlipidemia 11/08/2016   History of anxiety 11/08/2016   History of retinal tear 11/08/2016   History of Bilateral plantar fasciitis 08/10/2016   High risk medication use 07/04/2016   Lupus ([place]) 05/23/2016   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[place]) 03/18/2016   ADHD (attention deficit hyperactivity disorder) 12/22/2014   Raynaud's phenomenon 10/17/2013   Hyperlipidemia 09/28/2011     THERAPY DIAG:  Pain in left leg  Muscle weakness (generalized)  Difficulty in walking, not elsewhere classified  Chronic right shoulder pain  PCP: Ngetich, Nelda Bucks NP   REFERRING PROVIDER: Leandrew Koyanagi, MD   REFERRING DIAG: [phone] (ICD-10-CM) - Left hamstring injury, initial encounter    Rationale for Evaluation and Treatment: Rehabilitation   ONSET DATE: Around 07/13/2022 for hamstring, several years for other complaints of Rt shoulder pain and back pain   SUBJECTIVE:    SUBJECTIVE STATEMENT: She has had chronic Rt shoulder pain off and on for 5 years and has become more flared up over the last 3 months, had MRI showing partial RTC tear and MD sent over new referral to add Rt shoulder into PT   PERTINENT HISTORY: Lt hamstring tear grade 1. Bakers Cyst per MD note.  Med history : lupus  NEW MRI Rt shoulder IMPRESSION: 1. Moderate tendinosis of the  supraspinatus tendon with a partial-thickness bursal surface tear and a partial-thickness articular surface tear. 2. Mild tendinosis of the infraspinatus tendon. 3. Mild subacromial/subdeltoid bursitis.   PAIN:  Pain location: 3/10 in hamstring, 5/10 pain in right shoulder description: weakness, strain      hip/SI joint: tenderness, tightness, spasms Aggravating factors: prolonged standing Relieving factors: taping, stretching, previous treatment   PRECAUTIONS: None   WEIGHT BEARING RESTRICTIONS: No   FALLS:  Has patient fallen in last 6 months? Yes 2x - Outside of house Pt indicated a fear of falling.     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  Heal hamstring completely     OBJECTIVE:      PATIENT SURVEYS:  08/16/2022: FOTO update 49  07/26/2022 FOTO intake:31 predicted:  30   COGNITION: 07/26/2022 Overall cognitive status: WFL                        SENSATION: 07/26/2022 WFL   MUSCLE LENGTH: 07/26/2022 Hamstrings passive SLR supine: Right 90 deg; Left 70 deg c pain   POSTURE:  07/26/2022 unremarkable in standing   PALPATION: 07/26/2022 Tenderness noted to light touch in Lt lumbar paraspinals   ROM:    ROM 07/26/2022 Right 07/26/2022 Left 07/26/2022 08/09/2022 Right 08/23/22  Shoulder flexion        WNL  Shoulder abduction        WNL  Shoulder internal rotation        WNL  Shoulder external rotation        WNL         Knee flexion          Knee extension                     Lumbar extension 75% WFL c end range back complaints   Repeated x 5 in standing c ERP worsened     75% WFL c tightness lower back (more on Lt)   Lumbar flexion To floor no complaints     Hands  flat to floor               (Blank rows = not tested)   LOWER EXTREMITY MMT:   MMT Right 07/26/2022 Left 07/26/2022 Left 07/28/22 Left 08/09/2022  Hip flexion 5/5 5/5    Hip extension Check next  visit Check next visit 4/5   Hip abduction Check next visit Check next visit 4/5   Hip adduction        Hip internal rotation        Hip external rotation        Knee flexion 5/5   39, 36.4 lbs   3+/5 14 lbs With  pain   4/5 20.6, 18.4 lbs  Knee extension 5/5 5/5    Ankle dorsiflexion 5/5 5/5    Ankle plantarflexion        Ankle inversion        Ankle eversion         (Blank rows = not tested)  UPPER EXTREMITY MMT:  MMT Right 08/23/22   Shoulder flexion 5   Shoulder extension    Shoulder abduction 4   Shoulder adduction    Shoulder extension    Shoulder internal rotation 4   Shoulder external rotation 4-   Middle trapezius    Lower trapezius    Elbow flexion    Elbow extension    Wrist flexion    Wrist extension    Wrist ulnar deviation    Wrist radial deviation    Wrist pronation    Wrist supination    Grip strength     (Blank rows = not tested)    SPECIAL TESTS:  07/26/2022 Lt slump test positive for complaints in thigh and calf, Negative on Rt Negative crossed slr bilateral for any radicular pain   FUNCTIONAL TESTS:  08/16/2022:  Lt SLS:  18 seconds  07/26/2022 18 inch chair transfer:  1st try without hands Lt SLS:  10 seconds, stopped due to back related complaints Rt SLS:  30 seconds c mild aberrant movement   GAIT: 07/26/2022 Independent ambulation      TODAY'S TREATMENT  08/23/2022 Therex: Rt shoulder isometrics 5 sec X 10 each for abduction, ER, IR Lt LE nerve glide/floss X 20  Lt hamstring stretching 5 sec X 10 Supine bridges with feet on red ball 5 sec X 10 Supine bridge into hamstring curl with feet on red ball 2X5  Manual therapy for skilled palpation and Trigger Point Dry-Needling  Treatment instructions: Expect mild to moderate  muscle soreness. Patient Consent Given: Yes Education handout provided: previously Muscles treated: Lt hamstrings, Rt supraspinatus, Rt infraspinatus, Rt subscapularis, Rt deltoid/RTC insertion Treatment response/outcome: good overall tolerance,twitch response noted     PATIENT EDUCATION:   07/26/2022 Education details: HEP, POC Person educated: Patient Education method: Consulting civil engineer, Media planner, Verbal cues, and Handouts Education comprehension: verbalized understanding, returned demonstration, and verbal cues required   HOME EXERCISE PROGRAM: Access Code: [email or web address] URL: [email or web address] Date: 08/23/2022 Prepared by: Elsie Ra  Exercises - Supine Bridge  - 1-2 x daily - 7 x weekly - 1-2 sets - 10 reps - 2 hold - Supine 90/90 Sciatic Nerve Glide with Knee Flexion/Extension  - 1-2 x daily - 7 x weekly - 1-2 sets - 10 reps - Sidelying Hip Abduction  - 1-2 x daily - 7 x weekly - 2-3 sets - 10-15 reps - Standing Hip Hiking  - 1-2 x daily - 7 x weekly - 1 sets - 10 reps - 5 hold - Seated Hamstring Set (Mirrored)  - 2-3 x daily - 7 x weekly - 1 sets - 10-15 reps - 5-10 hold - Prone Alternating Arm and Leg Lifts  - 1-2 x daily - 7 x weekly - 1-2 sets - 10 reps - 3 hold - Sidelying Lumbar Rotation  Stretch (Mirrored)  - 2-3 x daily - 7 x weekly - 1 sets - 5 reps - 15 hold - Standing Isometric Shoulder Internal Rotation at Doorway  - 2 x daily - 6 x weekly - 1-2 sets - 10 reps - 5 hold - Standing Isometric Shoulder External Rotation with Doorway  - 2 x daily - 6 x weekly - 1-2 sets - 10 reps - 5 hold - Standing Isometric Shoulder Abduction with Doorway - Arm Bent  - 2 x daily - 6 x weekly - 1-2 sets - 10 reps - 5 hold   ASSESSMENT:   CLINICAL IMPRESSION: Re-evaluation today as she has new MD referral to add in Rt shoulder into her PT plan of care. She has partial RTC tear in supraspinatus found on MRI. She is scheduled for Cortizone injection today after PT. She has  good overall ROM but has decreased RTC strength and scapular instability so I did provide her with some shoulder Isometrics to start with and treated her with DN to her shoulder and hamstring today. PT recommending continued PT to address her functional impairments listed below. PT goals also updated to add in Rt shoulder goals.   OBJECTIVE IMPAIRMENTS: decreased activity tolerance, decreased balance, decreased coordination, decreased endurance, decreased mobility, difficulty walking, decreased ROM, decreased strength, increased fascial restrictions, impaired perceived functional ability, increased muscle spasms, impaired flexibility, improper body mechanics, and pain.    ACTIVITY LIMITATIONS: carrying, lifting, bending, standing, squatting, stairs, transfers, and locomotion level   PARTICIPATION LIMITATIONS: cleaning, interpersonal relationship, community activity, occupation, yard work, and exercise routine, walking dog   PERSONAL FACTORS: Time since onset of injury/illness/exacerbation and multiple treatment areas, lupus  are also affecting patient's functional outcome.    REHAB POTENTIAL: Good   CLINICAL DECISION MAKING: Stable/uncomplicated   EVALUATION COMPLEXITY: Low     GOALS: Goals reviewed with patient? Yes   SHORT TERM GOALS: (target date for Short term goals are 3 weeks 08/16/2022)    1.  Patient will demonstrate independent use of home exercise program to maintain progress from in clinic treatments.   Goal status: Met 08/16/2022   LONG TERM GOALS: (target dates for all long term goals are 10 weeks  10/04/2022 )   1. Patient will demonstrate/report pain at worst less than or equal to 2/10 to facilitate minimal limitation in daily activity secondary to pain symptoms.   Goal status: on going 08/16/2022   2. Patient will demonstrate independent use of home exercise program to facilitate ability to maintain/progress functional gains from skilled physical therapy services.   Goal  status: on going 08/16/2022   3. Patient will demonstrate FOTO outcome > or = 58 % to indicate reduced disability due to condition.   Goal status: on going 08/16/2022   4.  Patient will demonstrate Lt LE MMT 5/5 , dynamometry within 15 % of Rt throughout to faciltiate usual transfers, stairs, squatting at [facility] for daily life.    Goal status: on going 08/16/2022   5.  Patient will demonstrate Lt passive hamstring SLR to 90 deg equal to Rt s symptoms for normal mobility.  Goal status: on going 08/16/2022   6.  Patient will demonstrate bilateral SLS > 30 seconds to facilitate stability in ambulation on even and uneven ground.  Goal status: on going 08/16/2022   7.  Patient will demonstrate lumbar AROM WFL s symptoms to facilitate usual mobility at PLOF in standing/walking.  Goal Status: on going 08/16/2022  8. Pt will improve Rt shoulder  strength to at least 4+/5 MMT to show improved functional strength  Goal status new goal 08/23/22 9. Pt will decrease Rt shoulder pain to less than 3/10 on average with usual activity.    Goal status new goal 08/23/22   PLAN:   PT FREQUENCY: 1-2x/week   PT DURATION: 10 weeks   PLANNED INTERVENTIONS: Therapeutic exercises, Therapeutic activity, Neuro Muscular re-education, Balance training, Gait training, Patient/Family education, Joint mobilization, Stair training, DME instructions, Dry Needling, Electrical stimulation, Traction, Cryotherapy, vasopneumatic deviceMoist heat, Taping, Ultrasound, Ionotophoresis 4m/ml Dexamethasone, and Manual therapy.  All included unless contraindicated   PLAN FOR NEXT SESSION: Rt shoulder now added into PT POC so will need Rt RTC and scapular strengthening to tolerance. DN as desired in back/hamstring/shoulder.  Progressive LE strengthening.  Check dynamometry leg strength.    BElsie Ra PT, DPT 08/23/22 2:23 PM

## 2022-08-23 NOTE — Progress Notes (Signed)
Robin [name] - 51 y.o. female MRN [id number]  Date of birth: [birth year]  Office Visit Note: Visit Date: 08/23/2022 PCP: Robin Hughs, NP Referred by: Robin Hughs, NP  Subjective: Chief Complaint  Patient presents with   Left Ankle - Pain   Right Shoulder - Pain   HPI: Robin [name] is a pleasant 51 y.o. female who presents today for left ankle injury. Also for evaluation and possible injection for right shoulder pain.  Left foot/ankle injury -last week patient suffered a inversion ankle sprain.  This aggravated the arch of her foot near the spring ligament.  She previously saw Robin [name] in the past for this who did diagnose her with a partial spring ligament tear on ultrasound.  She has been using Kinesiotape to help support the ankle.  She does have generalized hypermobility.  She has been resting, activity modification and heat to the area.  Also supported with KT tape.  Localized soft tissue swelling within the medial arch, but her pain and swelling has improved over the last few days.  She is also having chronic right shoulder pain.  She recently saw Robin [name] on 08/17/2019.  Recommended ultrasound-guided intra-articular glenohumeral joint injection.  She is curious if she can start this today.  He was planning on doing formalized physical therapy as well.  Pertinent ROS were reviewed with the patient and found to be negative unless otherwise specified above in HPI.   Assessment & Plan: Visit Diagnoses:  1. Pain in left ankle and joints of left foot   2. Chronic right shoulder pain   3. Hypermobility syndrome    Plan: Reassured Robin I do not think her ankle/foot injury was bony in nature, negative [place] ankle & foot rules today.  Based on where her pain is from her history of a spring ligament injury, [name] think she reaggravated this.  She will continue rest, activity modification and support with the Kinesiotape.  She may take over-the-counter  anti-inflammatories only as needed.  Recommended towel scrunch and other home exercises to help support the spring ligament and plantar fascia.  In terms of the shoulder, we elected to proceed with ultrasound-guided glenohumeral joint injection, she tolerated this well.  She will take these in the next 2 days.  Recommend starting formalized physical therapy the following week, she already has this referral.  She will follow-up with [place] on an as-needed basis.  Follow-up: Return if symptoms worsen or fail to improve.   Meds & Orders: No orders of the defined types were placed in this encounter.   Orders Placed This Encounter  Procedures   Large Joint Inj: R glenohumeral   US Guided Needle Placement - No Linked Charges     Procedures: Large Joint Inj: R glenohumeral on 08/23/2022 3:17 PM Indications: pain Details: 22 G 3.5 in needle, ultrasound-guided posterior approach Medications: 2 mL lidocaine 1 %; 2 mL bupivacaine 0.25 %; 80 mg methylPREDNISolone acetate 40 MG/ML Outcome: tolerated well, no immediate complications  US-guided glenohumeral joint injection, right shoulder After discussion on risks/benefits/indications, informed verbal consent was obtained. A timeout was then performed. The patient was positioned lying lateral recumbent on examination table. The patient's shoulder was prepped with betadine and multiple alcohol swabs and utilizing ultrasound guidance, the patient's glenohumeral joint was identified on ultrasound. Using ultrasound guidance a 22-gauge, 3.5 inch needle with a mixture of 2:2:2 cc's lidocaine:bupivicaine:depomedrol was directed from a lateral to medial direction via in-plane technique into the glenohumeral joint with  visualization of appropriate spread of injectate into the joint. Patient tolerated the procedure well without immediate complications.      Procedure, treatment alternatives, risks and benefits explained, specific risks discussed. Consent was given by  the patient. Immediately prior to procedure a time out was called to verify the correct patient, procedure, equipment, support staff and site/side marked as required. Patient was prepped and draped in the usual sterile fashion.          Clinical History: No specialty comments available.  She reports that she quit smoking about 21 years ago. Her smoking use included cigarettes. She has a 7.50 pack-year smoking history. She has never been exposed to tobacco smoke. She has never used smokeless tobacco. No results for input(s): "HGBA1C", "LABURIC" in the last 8760 hours.  Objective:   Vital Signs: There were no vitals taken for this visit.  Physical Exam  Gen: Well-appearing, in no acute distress; non-toxic CV: Regular Rate. Well-perfused. Warm.  Resp: Breathing unlabored on room air; no wheezing. Psych: Fluid speech in conversation; appropriate affect; normal thought process Neuro: Sensation intact throughout. No gross coordination deficits.   Ortho Exam - Left foot/ankle: There is no bony tenderness at the medial malleolus, lateral malleolus or navicular.  There is some positive TTP and associated soft tissue swelling of the spring ligament and the plantar arch of the foot.  The left foot has about one half of the height loss of the longitudinal arch compared to the contralateral right foot.  Neurovascular intact.  Imaging:  *Independent review of limited ultrasound from 05/19/2022 of the left foot was reviewed and interpreted by myself.  There is evidence of hypoechoic fluid change and hyperemia near the spring ligament and plantar fascia insertion, likely indicative of a partial tear.   [place] LIMITED JOINT SPACE STRUCTURES UP RIGHT Limited ultrasound: Right shoulder:   Effusion noted within the bicipital tendon sheath as well as  multilobulated cyst at the proximal portion of the biceps tendon. Normal-appearing subscapularis and supraspinatus. Effusion noted within the posterior  glenohumeral joint.   Summary: Changes most consistent with a degenerative labrum.   Ultrasound and interpretation by Robin Coots, MD [place] LIMITED JOINT SPACE STRUCTURES LOW LEFT Limited ultrasound: Left foot:   Normal insertion of the posterior tibialis into the navicular. There appears to be disruption and hyperemia of the spring ligament. No ankle effusion. Chronic changes over the lateral talus and distal fibula. Normal insertion into the peroneal brevis   Summary: Acute partial tear of the spring ligament with chronic changes in  the lateral compartment of the ankle   Ultrasound and interpretation by Robin Coots, MD    Past Medical/Family/Surgical/Social History: Medications & Allergies reviewed per EMR, new medications updated. Patient Active Problem List   Diagnosis Date Noted   Left hamstring injury 07/15/2022   Labral tear of shoulder, degenerative, right 06/21/2022   Left carpal tunnel syndrome 05/26/2022   Foot sprain, left, initial encounter 05/19/2022   Baker's cyst of knee, left 04/21/2022   Right carpal tunnel syndrome 03/16/2022   Flushing 04/14/2021   Hypermobility syndrome 11/27/2020   Seasonal and perennial allergic rhinitis 11/06/2018   Mild intermittent asthma without complication 56/38/7564   Flexural atopic dermatitis 11/06/2018   History of hyperlipidemia 11/08/2016   History of anxiety 11/08/2016   History of retinal tear 11/08/2016   History of Bilateral plantar fasciitis 08/10/2016   High risk medication use 07/04/2016   Lupus ([place]) 05/23/2016   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[place]) 03/18/2016   ADHD (attention  deficit hyperactivity disorder) 12/22/2014   Raynaud's phenomenon 10/17/2013   Hyperlipidemia 09/28/2011   Past Medical History:  Diagnosis Date   ADD (attention deficit disorder)    Allergy    Anemia    Anorexia nervosa with bulimia    Per [place] New Patient Packet   Anxiety    Asthma    Carpal tunnel syndrome    Per [place] New Patient Packet    Depression    Dry eyes    Per [place] New Patient Packet   Dry mouth    Per [place] New Patient Packet   Fibroids    Per [place] New Patient Packet   History of hip x-ray 2021   Per [place] New Patient Packet   History of Papanicolaou smear of cervix 2021   Per [place] New Patient Packet, RobinPickens   Hyperlipidemia    Ileitis    Lupus ([place])    PONV (postoperative nausea and vomiting)    mild nausea    Raynaud disease    Retinal tear    Per [place] New Patient Packet   Systemic lupus erythematosus ([place])    Family History  Problem Relation Age of Onset   Heart disease Mother 26       CAD/stenting   Hyperlipidemia Mother    Arthritis Mother        ostearthritis   Hypertension Mother    Kidney disease Mother    Irritable bowel syndrome Mother    Stroke Mother 65       CVA   Fibromyalgia Mother    Eczema Mother    Sjogren's syndrome Mother    Hyperlipidemia Father    Neuropathy Father    Allergies Father        shellfish   Prostatitis Father    Hyperlipidemia Sister    ADD / ADHD Sister    Bipolar disorder Sister    Depression Sister    Hypertension Brother    OCD Brother    Anxiety disorder Daughter    ADD / ADHD Daughter    Asthma Daughter    OCD Son    Colon cancer Neg Hx    Inflammatory bowel disease Neg Hx    Allergic rhinitis Neg Hx    Angioedema Neg Hx    Atopy Neg Hx    Immunodeficiency Neg Hx    Urticaria Neg Hx    Past Surgical History:  Procedure Laterality Date   CARPAL TUNNEL RELEASE Right    08/2019   CESAREAN SECTION  2003   CESAREAN SECTION  2005   Per [facility] New Patient Packet   COLONOSCOPY  2017   RobinJacobs   cyst drained Left 2023   knee   DENTAL SURGERY  2009   Fromed tooth and chronic infection   DILITATION & CURRETTAGE/HYSTROSCOPY WITH NOVASURE ABLATION N/A 06/03/2020   Procedure: DILATATION & CURETTAGE/HYSTEROSCOPY WITH NOVASURE ABLATION;  Surgeon: Aletha Halim, MD;  Location: [place];  Service: Gynecology;  Laterality: N/A;    ENDOMETRIAL BIOPSY  04/20/2020       EYE SURGERY Bilateral    cryoretinopexy   Social History   Occupational History   Not on file  Tobacco Use   Smoking status: Former    Packs/day: 0.75    Years: 10.00    Total pack years: 7.50    Types: Cigarettes    Quit date: 07/05/2001    Years since quitting: 21.1    Passive exposure: Never   Smokeless tobacco: Never  Vaping  Use   Vaping Use: Never used  Substance and Sexual Activity   Alcohol use: Yes    Comment: occ   Drug use: Never   Sexual activity: Yes    Birth control/protection: Condom

## 2022-09-02 ENCOUNTER — Encounter: Payer: BC Managed Care – PPO | Admitting: Physical Therapy

## 2022-09-06 DIAGNOSIS — U071 COVID-19: Secondary | ICD-10-CM | POA: Diagnosis not present

## 2022-09-07 ENCOUNTER — Telehealth (INDEPENDENT_AMBULATORY_CARE_PROVIDER_SITE_OTHER): Payer: BC Managed Care – PPO | Admitting: Family

## 2022-09-07 ENCOUNTER — Encounter: Payer: Self-pay | Admitting: Family

## 2022-09-07 DIAGNOSIS — U071 COVID-19: Secondary | ICD-10-CM

## 2022-09-07 DIAGNOSIS — R062 Wheezing: Secondary | ICD-10-CM | POA: Diagnosis not present

## 2022-09-07 DIAGNOSIS — J069 Acute upper respiratory infection, unspecified: Secondary | ICD-10-CM | POA: Diagnosis not present

## 2022-09-07 MED ORDER — NIRMATRELVIR/RITONAVIR (PAXLOVID)TABLET: 3.0000 | ORAL_TABLET | Freq: Two times a day (BID) | ORAL | 0 refills | Status: AC

## 2022-09-07 MED ORDER — ALBUTEROL SULFATE HFA 108 (90 BASE) MCG/ACT IN AERS: 2.0000 | INHALATION_SPRAY | Freq: Four times a day (QID) | RESPIRATORY_TRACT | 5 refills | Status: DC | PRN

## 2022-09-07 NOTE — Patient Instructions (Addendum)
-   Take Zinc 50 mg tablet one by mouth daily for 14 days  - Take Vitamin C 500 mg tablet one by mouth twice daily x 14 days  -  vitamin D 2000 units one by mouth daily x 14 days  - Tylenol as needed for fever or body aches  - over the counter Mucinex as needed for cough  - increase your fruits intake in your diet  - increase your water intake to 6-8 glasses of water daily  - self quarantine for 5 days and wear facial covering when out in public  for 5 days per CDC guidelines  - Notify provider or go to ED if you develop any chest tightness,chest pain or shortness of breath

## 2022-09-07 NOTE — Progress Notes (Signed)
This service is provided via telemedicine  No vital signs collected/recorded due to the encounter was a telemedicine visit.   Location of patient (ex: home, work):  Home  Patient consents to a telephone visit:  Yes  Location of the provider (ex: office, home):  [employer].  Name of any referring provider:  Journie Howson, Nelda Bucks, NP   Names of all persons participating in the telemedicine service and their role in the encounter:  Patient, Heriberto Antigua, [place], [place], Webb Silversmith, NP.    Time spent on call: 8 minutes spent on the phone with Medical Assistant.    Provider: Marlowe Sax FNP-C  Carmelita Amparo, Nelda Bucks, NP  Patient Care Team: Brendt Dible, Nelda Bucks, NP as PCP - General (Family Medicine) Valentina Shaggy, MD as Consulting Physician (Allergy and Immunology) Milus Banister, MD as Attending Physician (Gastroenterology) Aletha Halim, MD as Consulting Physician (Obstetrics and Gynecology) Drake Leach, [facility])  Extended Emergency Contact Information Primary Emergency Contact: Shibata,Todd Address: [place], [place] Phone: [phone] Mobile Phone: [phone] Relation: Spouse Secondary Emergency Contact: [place] Mobile Phone: [phone] Relation: Other  Code Status:  Full Code  Goals of care: Advanced Directive information    09/07/2022    1:36 PM  Advanced Directives  Does Patient Have a Medical Advance Directive? No  Does patient want to make changes to medical advance directive? No - Patient declined     Chief Complaint  Patient presents with   Acute Visit    Patient has POSITIVE home Covid-19 test. Patient symptoms are sore throat, body aches, severe chills, nasal congestion, low grade fever, coughing, and chest congestion.    HPI:  Pt is a 51 y.o. female seen today for an acute visit for evaluation of COVID-19 positive home test on 09/06/2022.Had slight sore throat 4  days ago but symptoms have worsen.Has had generalized body aches,cough ,nasal congestion,chest tightness  and low grade fever.Has used OTC analgesic,cough syrup and saline spray. States travelled to [place] with family before [place].Her sister tested positive for COVID-19.  She denies any,fatigue,runny nose,chest tightness,chest pain,palpitation or shortness of breath.    Past Medical History:  Diagnosis Date   ADD (attention deficit disorder)    Allergy    Anemia    Anorexia nervosa with bulimia    Per [place] [facility] Packet   Anxiety    Asthma    Carpal tunnel syndrome    Per [facility] [facility]   Depression    Dry eyes    Per [place] [facility]   Dry mouth    Per [place] [facility] Packet   Fibroids    Per [facility] Patient Packet   History of hip x-ray 2021   Per [place] [facility]   History of Papanicolaou smear of cervix 2021   Per [facility] [facility] Packet, Dr.Pickens   Hyperlipidemia    Ileitis    Lupus ([place])    PONV (postoperative nausea and vomiting)    mild nausea    Raynaud disease    Retinal tear    Per [facility] Packet   Systemic lupus erythematosus ([place])    Past Surgical History:  Procedure Laterality Date   CARPAL TUNNEL RELEASE Right    08/2019   CESAREAN SECTION  2003   CESAREAN SECTION  2005   Per [facility] Patient Packet   COLONOSCOPY  2017  Dr.Jacobs   cyst drained Left 2023   knee   DENTAL SURGERY  2009   Fromed tooth and chronic infection   DILITATION & CURRETTAGE/HYSTROSCOPY WITH NOVASURE ABLATION N/A 06/03/2020   Procedure: DILATATION & CURETTAGE/HYSTEROSCOPY WITH NOVASURE ABLATION;  Surgeon: Aletha Halim, MD;  Location: [facility];  Service: Gynecology;  Laterality: N/A;   ENDOMETRIAL BIOPSY  04/20/2020       EYE SURGERY Bilateral    cryoretinopexy    Allergies  Allergen Reactions   Cromolyn Sodium Swelling and Rash   Avelox [Moxifloxacin Hcl In Nacl] Other (See Comments)    Muscle cramps    Cefaclor Hives    Tolerates penicillins   Dust Mite Extract    Latex Other (See Comments)    "Raw skin"   Mold Extract [Trichophyton]     & cats    Red Wine Complex [Germanium]     Itching    Meloxicam Rash   Naproxen Rash    Outpatient Encounter Medications as of 09/07/2022  Medication Sig   albuterol (PROVENTIL HFA;VENTOLIN HFA) 108 (90 Base) MCG/ACT inhaler Inhale 2 puffs into the lungs every 6 (six) hours as needed for wheezing or shortness of breath.   amphetamine-dextroamphetamine (ADDERALL XR) 20 MG 24 hr capsule Take 20 mg by mouth every morning.   Ascorbic Acid (VITAMIN C) 1000 MG tablet Take 1,000 mg by mouth daily.   azelastine (ASTELIN) 0.1 % nasal spray USE 2 SPRAYS IN EACH NOSTRIL TWICE DAILY AS NEEDED FOR RHINITIS   B Complex Vitamins (VITAMIN B COMPLEX PO) Take by mouth daily.   buPROPion (WELLBUTRIN XL) 300 MG 24 hr tablet Take 300 mg by mouth daily.   Coenzyme Q10 (CO Q 10 PO) Take by mouth daily.   dicyclomine (BENTYL) 10 MG capsule TAKE 1 CAPSULE BY MOUTH TWICE DAILY AS NEEDED FOR SPASMS (abdominal PAIN)   DiphenhydrAMINE HCl (BENADRYL ALLERGY PO) Take 50 mg by mouth daily at 12 noon.   EPINEPHrine 0.3 mg/0.3 mL IJ SOAJ injection Inject 0.3 mg into the muscle as needed for anaphylaxis.   famotidine (PEPCID) 20 MG tablet Take 1 tablet (20 mg total) by mouth 2 (two) times daily.   Fexofenadine HCl (ALLEGRA PO) Take by mouth daily.   FLUoxetine (PROZAC) 40 MG capsule Take 40 mg by mouth daily.   hydroxychloroquine (PLAQUENIL) 200 MG tablet Take 1 tablet (200 mg total) by mouth 2 (two) times daily.   Loratadine (CLARITIN PO) Take by mouth.   MAGNESIUM PO Take by mouth daily.   Melatonin 10 MG TABS Take 1 tablet by mouth daily.   Menaquinone-7 (VITAMIN K2) 100 MCG CAPS Take 1 capsule by mouth daily.   Methylcellulose, Laxative, (CITRUCEL PO) Take by mouth as needed.   montelukast (SINGULAIR) 10 MG tablet Take 1 tablet (10 mg total) by mouth in the morning and at  bedtime.   NALTREXONE HCL PO Take 6 mg by mouth daily.   Omega 3-6-9 Fatty Acids (OMEGA-3-6-9 PO) Take 1 capsule by mouth as needed.   Probiotic Product (PROBIOTIC PO) Take by mouth daily.   QUERCETIN PO Take by mouth daily.   SUMAtriptan (IMITREX) 50 MG tablet Take 50 mg by mouth as needed for migraine. May repeat in 2 hours if headache persists or recurs.   VITAMIN D, CHOLECALCIFEROL, PO Take 500 mg by mouth.   [DISCONTINUED] Acetylcarnitine HCl (ACETYL-L-CARNITINE HCL) POWD by Does not apply route daily.   [DISCONTINUED] LAVENDER OIL PO Take by mouth daily.   [DISCONTINUED] Methylsulfonylmethane  1000 MG CAPS Take 1 capsule by mouth daily.   [DISCONTINUED] SUMAtriptan (IMITREX) 50 MG tablet Take 1 tablet (50 mg total) by mouth once for 1 dose. May repeat in 2 hours if headache persists or recurs, but do not exceed 224m per day for migraine.   [DISCONTINUED] UNABLE TO FIND once a week. Med Name: allergy shots   No facility-administered encounter medications on file as of 09/07/2022.    Review of Systems  Constitutional:  Positive for chills, fatigue and fever. Negative for appetite change and unexpected weight change.       Generalized body aches   HENT:  Positive for congestion, rhinorrhea and sore throat. Negative for dental problem, ear discharge, ear pain, facial swelling, hearing loss, nosebleeds, postnasal drip, sinus pressure, sinus pain, sneezing, tinnitus and trouble swallowing.   Eyes:  Negative for pain, discharge, redness, itching and visual disturbance.  Respiratory:  Positive for cough and chest tightness. Negative for shortness of breath and wheezing.   Cardiovascular:  Negative for chest pain, palpitations and leg swelling.  Gastrointestinal:  Negative for abdominal distention, abdominal pain, constipation, diarrhea, nausea and vomiting.  Skin:  Negative for color change, pallor and rash.  Neurological:  Negative for dizziness, light-headedness and headaches.     Immunization History  Administered Date(s) Administered   Influenza,inj,Quad PF,6+ Mos 05/12/2014, 11/27/2015, 05/23/2016, 10/30/2017, 07/30/2018   Influenza-Unspecified 07/08/2022   Moderna Sars-Covid-2 Vaccination 10/27/2020, 10/31/2020   PFIZER(Purple Top)SARS-COV-2 Vaccination 12/06/2019, 12/16/2019, 05/27/2020   Pneumococcal Conjugate-13 05/23/2016   Tdap 09/28/2011, 08/12/2020   Pertinent  Health Maintenance Due  Topic Date Due   PAP SMEAR-Modifier  04/21/2023   COLONOSCOPY (Pts [id number] coverage will need to be confirmed)  04/08/2026   INFLUENZA VACCINE  Completed      02/20/2020   11:46 AM 06/03/2020   12:46 PM 08/12/2020    9:16 AM 04/05/2021    8:38 AM 09/07/2022    1:36 PM  Fall Risk  Falls in the past year? 0   0 1  Was there an injury with Fall? 0   0 1  Fall Risk Category Calculator 0   0 2  Fall Risk Category Low   Low Moderate  Patient Fall Risk Level  Low fall risk High fall risk Low fall risk Moderate fall risk  Patient at Risk for Falls Due to    No Fall Risks History of fall(s);Impaired balance/gait;Impaired mobility  Fall risk Follow up    Falls evaluation completed Falls evaluation completed;Education provided;Falls prevention discussed   Functional Status Survey:    There were no vitals filed for this visit. There is no height or weight on file to calculate BMI. Physical Exam Constitutional:      General: She is not in acute distress.    Appearance: She is not ill-appearing.  HENT:     Nose: Congestion and rhinorrhea present.  Pulmonary:     Effort: Pulmonary effort is normal. No respiratory distress.  Neurological:     Mental Status: She is alert and oriented to person, place, and time.     Gait: Gait normal.     Labs reviewed: Recent Labs    09/23/21 0955 05/26/22 0933 06/29/22 1055  NA 138 137 135  K 4.6 4.4 3.8  CL 101 100 99  CO2 31 29 30   GLUCOSE 75 68 83  BUN 13 18 18   CREATININE 0.83 1.03* 0.88  CALCIUM 10.0 9.8  9.4   Recent Labs    09/23/21 0955 05/26/22 0933  AST 22 19  ALT 17 18  BILITOT 0.6 0.5  PROT 7.6 7.4   Recent Labs    09/23/21 0955 05/26/22 0933  WBC 4.6 3.9  NEUTROABS 2,452 1,903  HGB 14.0 14.5  HCT 42.6 44.3  MCV 96.8 97.8  PLT 295 311   Lab Results  Component Value Date   TSH 1.70 04/07/2021   No results found for: "HGBA1C" Lab Results  Component Value Date   CHOL 260 (H) 04/07/2021   HDL 84 04/07/2021   LDLCALC 157 (H) 04/07/2021   TRIG 84 04/07/2021   CHOLHDL 3.1 04/07/2021    Significant Diagnostic Results in last 30 days:  MR SHOULDER RIGHT W CONTRAST  Result Date: 08/10/2022 CLINICAL DATA:  Right shoulder pain for 5 years. EXAM: MRI OF THE RIGHT SHOULDER WITH CONTRAST TECHNIQUE: Multiplanar, multisequence MR imaging of the right shoulder shoulder was performed following the administration of intra-articular contrast. CONTRAST:  See Injection Documentation. COMPARISON:  None Available. FINDINGS: Rotator cuff: Moderate tendinosis of the supraspinatus tendon with a partial-thickness bursal surface tear and a partial-thickness articular surface tear. Mild tendinosis of the infraspinatus tendon. Teres minor tendon is intact. Subscapularis tendon is intact. Muscles: No muscle atrophy or edema. No intramuscular fluid collection or hematoma. Biceps Long Head: Intraarticular and extraarticular portions of the biceps tendon are intact. Acromioclavicular Joint: No significant arthropathy of the acromioclavicular joint. Small amount of subacromial/subdeltoid bursal fluid. Glenohumeral Joint: Intraarticular contrast distending the joint capsule. Normal glenohumeral ligaments. Mild partial-thickness cartilage loss of the glenohumeral joint. Labrum: Intact. Bones: No fracture or dislocation. No aggressive osseous lesion. Other: No fluid collection or hematoma. IMPRESSION: 1. Moderate tendinosis of the supraspinatus tendon with a partial-thickness bursal surface tear and a  partial-thickness articular surface tear. 2. Mild tendinosis of the infraspinatus tendon. 3. Mild subacromial/subdeltoid bursitis. Electronically Signed   By: Kathreen Devoid M.D.   On: 08/10/2022 06:33   Arthrogram  Result Date: 08/09/2022 CLINICAL DATA:  Right shoulder pain.  Labral tear. EXAM: RIGHT SHOULDER INJECTION UNDER FLUOROSCOPY TECHNIQUE: An appropriate skin entrance site was determined. The site was marked, prepped with Betadine, draped in the usual sterile fashion, and infiltrated locally with 1% lidocaine. A 22 gauge spinal needle was advanced to the superomedial margin of the humeral head under intermittent fluoroscopy. 1 mL of 1% lidocaine injected easily. A mixture of 0.1 mL of MultiHance, 10 mL of Isovue-M 300, and 10 mL of sterile saline was then used to opacify the right shoulder capsule. 12 mL of this mixture were injected. No immediate complication. FLUOROSCOPY: Radiation Exposure Index (as provided by the fluoroscopic device): 0.40 mGy Kerma IMPRESSION: Technically successful right shoulder injection for MRI. Electronically Signed   By: Logan Bores M.D.   On: 08/09/2022 15:56    Assessment/Plan 1. COVID-19 virus RNA test result positive at limit of detection COVID-19 positive home test on 09/06/2022 exposed to her sister who was positive for COVID-19  - discussed treatment with Paxlovid.side effects discussed. -Notify provider or go to the ED if symptoms worsen or fail to improve - nirmatrelvir/ritonavir (PAXLOVID) 20 x 150 MG & 10 x 100MG TABS; Take 3 tablets by mouth 2 (two) times daily for 5 days. (Take nirmatrelvir 150 mg two tablets twice daily for 5 days and ritonavir 100 mg one tablet twice daily for 5 days) Patient GFR is 77.26  Dispense: 30 tablet; Refill: 0  2. Upper respiratory tract infection due to COVID-19 virus Supportive care with supplements - Take Zinc 50 mg tablet one  by mouth daily for 14 days  - Take Vitamin C 500 mg tablet one by mouth twice daily x 14 days   -  vitamin D 2000 units one by mouth daily x 14 days  - Tylenol as needed for fever or body aches  - over the counter Mucinex as needed for cough  - increase your fruits intake in your diet  - increase your water intake to 6-8 glasses of water daily  - self quarantine for 5 days and wear facial covering when out in public  for 5 days per CDC guidelines  - Notify provider or go to ED if you develop any chest tightness,chest pain or shortness of breath  - nirmatrelvir/ritonavir (PAXLOVID) 20 x 150 MG & 10 x 100MG TABS; Take 3 tablets by mouth 2 (two) times daily for 5 days. (Take nirmatrelvir 150 mg two tablets twice daily for 5 days and ritonavir 100 mg one tablet twice daily for 5 days) Patient GFR is 77.26  Dispense: 30 tablet; Refill: 0  3. Wheezing Refill albuterol - albuterol (VENTOLIN HFA) 108 (90 Base) MCG/ACT inhaler; Inhale 2 puffs into the lungs every 6 (six) hours as needed for wheezing or shortness of breath.  Dispense: 1 each; Refill: 5  Family/ staff Communication: Reviewed plan of care with patient verbalized understanding  Labs/tests ordered: None   Next Appointment: Return if symptoms worsen or fail to improve.  I connected with  [name] on 09/07/22 by a video enabled telemedicine application and verified that I am speaking with the correct person using two identifiers.   I discussed the limitations of evaluation and management by telemedicine. The patient expressed understanding and agreed to proceed.   Spent 13 minutes of face to face with patient  >50% time spent counseling; reviewing medical record; labs; and developing future plan of care.   Sandrea Hughs, NP

## 2022-09-08 ENCOUNTER — Encounter: Payer: BC Managed Care – PPO | Admitting: Rehabilitative and Restorative Service Providers"

## 2022-09-08 ENCOUNTER — Other Ambulatory Visit: Payer: Self-pay | Admitting: Physician Assistant

## 2022-09-08 ENCOUNTER — Encounter: Payer: BC Managed Care – PPO | Admitting: Physician Assistant

## 2022-09-08 DIAGNOSIS — M3219 Other organ or system involvement in systemic lupus erythematosus: Secondary | ICD-10-CM

## 2022-09-09 ENCOUNTER — Telehealth: Payer: Self-pay | Admitting: *Deleted

## 2022-09-09 ENCOUNTER — Encounter: Payer: BC Managed Care – PPO | Admitting: Physical Medicine & Rehabilitation

## 2022-09-09 NOTE — Telephone Encounter (Signed)
Patient called, records should be faxed to office 09/09/2022 per patient. Please call patient if records are not faxed.

## 2022-09-12 HISTORY — PX: OTHER SURGICAL HISTORY: SHX169

## 2022-09-13 ENCOUNTER — Telehealth: Payer: Self-pay

## 2022-09-13 ENCOUNTER — Encounter: Payer: Self-pay | Admitting: Rehabilitative and Restorative Service Providers"

## 2022-09-13 NOTE — Telephone Encounter (Signed)
Five days is the recommended quarantine period per CDC guidelines unless running a fever or still having any chills.continue to wear facial mask if out in the public or crowd.

## 2022-09-13 NOTE — Telephone Encounter (Addendum)
Call patient back about what [name] recommend for the next step of action and that she is aware of recommend and will follow the guidelines of the CDC.

## 2022-09-13 NOTE — Telephone Encounter (Addendum)
Patient call earlier today and was concern about testing positive with rapid test and has finish the anti-Covid medication and wanted to know what is the next step of action.

## 2022-09-14 ENCOUNTER — Encounter: Payer: Self-pay | Admitting: Rehabilitative and Restorative Service Providers"

## 2022-09-15 DIAGNOSIS — F411 Generalized anxiety disorder: Secondary | ICD-10-CM | POA: Diagnosis not present

## 2022-09-15 DIAGNOSIS — F339 Major depressive disorder, recurrent, unspecified: Secondary | ICD-10-CM | POA: Diagnosis not present

## 2022-09-15 DIAGNOSIS — F902 Attention-deficit hyperactivity disorder, combined type: Secondary | ICD-10-CM | POA: Diagnosis not present

## 2022-09-19 ENCOUNTER — Telehealth: Payer: Self-pay

## 2022-09-19 DIAGNOSIS — R0789 Other chest pain: Secondary | ICD-10-CM

## 2022-09-19 DIAGNOSIS — R059 Cough, unspecified: Secondary | ICD-10-CM

## 2022-09-19 NOTE — Telephone Encounter (Signed)
Suspect possible rebound symptoms from Paxlovid.Recommend chest X-ray to rule out Pneumonia.will order X-ray to be done at [facility] at [place]. If symptoms worsen follow up in the Emergency room.

## 2022-09-19 NOTE — Telephone Encounter (Signed)
Patient left messag on clinical intake voicemail stating that she was seen virtually abut 3 weeks ago for a positive COVID test. Patient states that she is still testing positive. She has chest tightness,head,ear and chest congestion. She would like to know what she should do.  Message routed to Marlowe Sax, NP

## 2022-09-19 NOTE — Telephone Encounter (Signed)
Spoke with patient and advised we received paperwork from her eye doctor. Patient advised unfortunately it was not the information we needed. Patient advised we faxed our form to them to fax and return. Patient advised we have not received it in return at this time. Patient advised to reach out to eye doctor to have them send.

## 2022-09-20 ENCOUNTER — Ambulatory Visit
Admission: RE | Admit: 2022-09-20 | Discharge: 2022-09-20 | Disposition: A | Discharge status: Home / Self Care | Payer: BC Managed Care – PPO | Source: Ambulatory Visit | Attending: Family | Admitting: Family

## 2022-09-20 DIAGNOSIS — R059 Cough, unspecified: Secondary | ICD-10-CM

## 2022-09-20 DIAGNOSIS — R0789 Other chest pain: Secondary | ICD-10-CM

## 2022-09-20 NOTE — Telephone Encounter (Signed)
Spoke with patient and she verbalized her understanding and agreed.

## 2022-09-21 DIAGNOSIS — F331 Major depressive disorder, recurrent, moderate: Secondary | ICD-10-CM | POA: Diagnosis not present

## 2022-09-29 ENCOUNTER — Other Ambulatory Visit: Payer: Self-pay | Admitting: Allergy & Immunology

## 2022-10-05 DIAGNOSIS — M329 Systemic lupus erythematosus, unspecified: Secondary | ICD-10-CM | POA: Diagnosis not present

## 2022-10-05 DIAGNOSIS — H04129 Dry eye syndrome of unspecified lacrimal gland: Secondary | ICD-10-CM | POA: Diagnosis not present

## 2022-10-05 DIAGNOSIS — F331 Major depressive disorder, recurrent, moderate: Secondary | ICD-10-CM | POA: Diagnosis not present

## 2022-10-05 DIAGNOSIS — H16143 Punctate keratitis, bilateral: Secondary | ICD-10-CM | POA: Diagnosis not present

## 2022-10-05 DIAGNOSIS — Z79899 Other long term (current) drug therapy: Secondary | ICD-10-CM | POA: Diagnosis not present

## 2022-10-06 ENCOUNTER — Ambulatory Visit: Payer: BC Managed Care – PPO | Admitting: Allergy & Immunology

## 2022-10-06 ENCOUNTER — Other Ambulatory Visit: Payer: Self-pay

## 2022-10-06 VITALS — BP 112/80 | HR 90 | Temp 97.9°F | Resp 12 | Ht 66.0 in | Wt 156.5 lb

## 2022-10-06 DIAGNOSIS — J4520 Mild intermittent asthma, uncomplicated: Secondary | ICD-10-CM

## 2022-10-06 DIAGNOSIS — J3089 Other allergic rhinitis: Secondary | ICD-10-CM

## 2022-10-06 DIAGNOSIS — L2089 Other atopic dermatitis: Secondary | ICD-10-CM

## 2022-10-06 DIAGNOSIS — D8940 Mast cell activation, unspecified: Secondary | ICD-10-CM | POA: Diagnosis not present

## 2022-10-06 DIAGNOSIS — G901 Familial dysautonomia [Riley-Day]: Secondary | ICD-10-CM

## 2022-10-06 DIAGNOSIS — J302 Other seasonal allergic rhinitis: Secondary | ICD-10-CM

## 2022-10-06 MED ORDER — FAMOTIDINE 40 MG PO TABS: 40.0000 mg | ORAL_TABLET | Freq: Every day | ORAL | 3 refills | Status: DC

## 2022-10-06 MED ORDER — AZELASTINE HCL 0.1 % NA SOLN: NASAL | 5 refills | Status: DC

## 2022-10-06 MED ORDER — MONTELUKAST SODIUM 10 MG PO TABS: 10.0000 mg | ORAL_TABLET | Freq: Two times a day (BID) | ORAL | 3 refills | Status: DC

## 2022-10-06 NOTE — Patient Instructions (Addendum)
1. Mild intermittent asthma, uncomplicated - Lung testing looked good today.  - We will not make any medication changes today. - Continue with albuterol every 4 hours as needed.    2. Seasonal and perennial allergic rhinitis (grasses, indoor molds, outdoor molds, dust mites, cat and cockroach) - Continue with an over the counter antihistamine daily.  - Continue with Singulair twice daily as you are doing.  - Continue with Astelin as needed.    3. Flexural atopic dermatitis - Continue with moisturizing twice daily as needed. - Continue with Eucrisa as needed.   4. 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 Esperanza Sheets.  - Continue with naltrexone '6mg'$  daily as you are doing. - Continue with loratadine '20mg'$  daily. - Continue with diphenhydramine '50mg'$  daily. - Continue with famotidine '40mg'$  nightly.  - Send me info on the cromolyn and where to send it.  - Thanks for teaching me so much!  5. Return in about 6 months (around 04/06/2023).    Please inform us of any Emergency Department visits, hospitalizations, or changes in symptoms. Call us before going to the ED for breathing or allergy symptoms since we might be able to fit you in for a sick visit. Feel free to contact us anytime with any questions, problems, or concerns.  It was a pleasure to see you and your family again today!  Websites that have reliable patient information: 1. American Academy of Asthma, Allergy, and Immunology: www.aaaai.org 2. Food Allergy Research and Education (FARE): foodallergy.org 3. Mothers of Asthmatics: http://www.asthmacommunitynetwork.org 4. American College of Allergy, Asthma, and Immunology: www.acaai.org   COVID-19 Vaccine Information can be found at: ShippingScam.co.uk For questions related to vaccine distribution or appointments, please email [email or web address] or call [phone].     "Like" [place] on Facebook  and Instagram for our latest updates!       Make sure you are registered to vote! If you have moved or changed any of your contact information, you will need to get this updated before voting!  In some cases, you MAY be able to register to vote online: [email or web address]

## 2022-10-06 NOTE — Progress Notes (Signed)
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 Estanislado Pandy  Plan/Recommendations:   1. Mild intermittent asthma, uncomplicated - Lung testing looked good today.  - We will not make any medication changes today. - Continue with albuterol every 4 hours as needed.    2. Seasonal and perennial allergic rhinitis (grasses, indoor molds, outdoor molds, dust mites, cat and cockroach) - Continue with an over the counter antihistamine daily.  - Continue with Singulair twice daily as you are doing.  - Continue with Astelin as needed.    3. Flexural atopic dermatitis - Continue with moisturizing twice daily as needed. - Continue with Eucrisa as needed.   4. 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 Esperanza Sheets.  - Continue with naltrexone '6mg'$  daily as you are doing. - Continue with loratadine '20mg'$  daily. - Continue with diphenhydramine '50mg'$  daily. - Continue with famotidine '40mg'$  nightly.  - Send me info on the cromolyn and where to send it.  - Thanks for teaching me so much!  5. Return in about 6 months (around 04/06/2023).    Subjective:   Robin [name] is a 52 y.o. female presenting today for follow up of  Chief Complaint  Patient presents with   Follow-up   Medication Reaction   Rash   Pruritus    Robin [name] has a history of the following: Patient Active Problem List   Diagnosis Date Noted   Left hamstring injury 07/15/2022   Labral tear of shoulder, degenerative, right 06/21/2022   Left carpal tunnel syndrome 05/26/2022   Foot sprain, left, initial encounter  05/19/2022   Baker's cyst of knee, left 04/21/2022   Right carpal tunnel syndrome 03/16/2022   Flushing 04/14/2021   Hypermobility syndrome 11/27/2020   Seasonal and perennial allergic rhinitis 11/06/2018   Mild intermittent asthma without complication 16/96/7893   Flexural atopic dermatitis 11/06/2018   History of hyperlipidemia 11/08/2016   History of anxiety 11/08/2016   History of retinal tear 11/08/2016   History of Bilateral plantar fasciitis 08/10/2016   High risk medication use 07/04/2016   Lupus ([place]) 05/23/2016   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[place]) 03/18/2016   ADHD (attention deficit hyperactivity disorder) 12/22/2014   Raynaud's phenomenon 10/17/2013   Hyperlipidemia 09/28/2011    History obtained from: chart review and patient.  Robin [name] is a 52 y.o. female presenting for a follow up visit.  She was last seen in November 2023.  At that time, she had a facial rash we started her on Eucrisa and prednisone.  For her asthma, her lung testing looked good.  We continue with albuterol as needed.  For her seasonal and perennial allergic rhinitis, she continue with an over-the-counter antihistamine and singular twice daily.  Atopic dermatitis was controlled with moisturizing.  For her concern for mast cell activation syndrome, she had an appointment at [facility].  We continue with naltrexone 6 mg daily as well as Claritin 20 mg daily, Benadryl 50 mg daily, and famotidine 20 mg daily.  Since last visit, she has done well. She did just get over [place] in early January. She got Paxlovid  and she felt better when she started this. She had some chest tightness and congestion for a while. Her positive test was December 26th and her negative test was 22 days later. She was not really sick with the rebound infection, but those symptoms cleared up fast. No one else in the house got it, but she was isolating the entire time that the kids were home from school.   Asthma/Respiratory Symptom History: She did use her  albuterol a fair amount. It did not feel terrible, but she had just the chest tightness and whatnot.  Robin [name]'s asthma has been well controlled. She has not required rescue medication, experienced nocturnal awakenings due to lower respiratory symptoms, nor have activities of daily living been limited. She has required no Emergency Department or Urgent Care visits for her asthma. She has required zero courses of systemic steroids for asthma exacerbations since the last visit. ACT score today is 25, indicating excellent asthma symptom control.   Allergic Rhinitis Symptom History: She remains on the Singulair as well as the Astelin.  She has not needed antibiotics at all since last visit.  Skin Symptom History: She had a rebound rash after she finished the prednisone. She had started eating almonds around that time and she noticed that since cutting these out, her rash has improved. This is definitely a dose dependent issue and it was building up and making her more reactive. The cold weather overall helps with her rash.   MCAS is better. Her naxltrexone has been working well. This was working and apparently takes some weeks to work. She is now on 6 mg daily which she has continued. She was having some sleeping issues with it. She feels a lot less reactive overall. She has an appointment with Dr. Esperanza Sheets at [facility] in February 2024.   She is interested in restarting the cromolyn because her inflammation was better when she was on it. She thought that she was reacting to it earlier in the fall 2023, which is why we stopped it.  She has some information about where to send it to compounded.  She will email that to me.  She remains on the Singulair BID and she changed the famotidine to twice at night.   She remains on Plaquenil for her lupus. She has halted it for one month. She had a visual field exam in December whose result is still pending. She started this in 2017 when she was very ill and having more  inflammation. She did notice improvement once she started it. Her side effects were fairly severe when she first started it. She had her dose increased in 2023 in the early summer.   Her kids are doing well.  Robin [name] is loving college. Robin [name] is going to move to [place] with their girlfriend.   Otherwise, there have been no changes to her past medical history, surgical history, family history, or social history.    Review of Systems  Constitutional: Negative.  Negative for chills, fever, malaise/fatigue and weight loss.  HENT: Negative.  Negative for congestion, ear discharge, ear pain and sinus pain.   Eyes:  Negative for pain, discharge and redness.  Respiratory:  Negative for cough, sputum production, shortness of breath and wheezing.   Cardiovascular: Negative.  Negative for chest pain and palpitations.  Gastrointestinal:  Negative for abdominal pain, constipation, diarrhea, heartburn, nausea and vomiting.  Skin:  Negative for itching and rash.  Neurological:  Negative for dizziness and headaches.  Endo/Heme/Allergies:  Negative for environmental allergies.  Does not bruise/bleed easily.       Objective:   Blood pressure 112/80, pulse 90, temperature 97.9 F (36.6 C), resp. rate 12, height '5\' 6"'$  (1.676 m), weight 156 lb 8 oz (71 kg), SpO2 96 %. Body mass index is 25.26 kg/m.    Physical Exam Vitals reviewed.  Constitutional:      Appearance: She is well-developed.     Comments: Very friendly. Talkative.   HENT:     Head: Normocephalic and atraumatic.     Right Ear: Tympanic membrane, ear canal and external ear normal.     Left Ear: Tympanic membrane, ear canal and external ear normal.     Nose: No nasal deformity, septal deviation, mucosal edema or rhinorrhea.     Right Turbinates: Enlarged, swollen and pale.     Left Turbinates: Enlarged, swollen and pale.     Right Sinus: No maxillary sinus tenderness or frontal sinus tenderness.     Left Sinus: No maxillary sinus  tenderness or frontal sinus tenderness.     Mouth/Throat:     Lips: Pink.     Mouth: Mucous membranes are moist. Mucous membranes are not pale and not dry.     Pharynx: Uvula midline.  Eyes:     General: Allergic shiner present.        Right eye: No discharge.        Left eye: No discharge.     Conjunctiva/sclera: Conjunctivae normal.     Right eye: Right conjunctiva is not injected. No chemosis.    Left eye: Left conjunctiva is not injected. No chemosis.    Pupils: Pupils are equal, round, and reactive to light.  Cardiovascular:     Rate and Rhythm: Normal rate and regular rhythm.     Heart sounds: Normal heart sounds.  Pulmonary:     Effort: Pulmonary effort is normal. No tachypnea, accessory muscle usage or respiratory distress.     Breath sounds: Normal breath sounds. No wheezing, rhonchi or rales.     Comments: Moving air well in all lung fields. Chest:     Chest wall: No tenderness.  Lymphadenopathy:     Cervical: No cervical adenopathy.  Skin:    General: Skin is warm.     Capillary Refill: Capillary refill takes less than 2 seconds.     Coloration: Skin is not pale.     Findings: No abrasion, erythema, petechiae or rash. Rash is not macular, papular, urticarial or vesicular.     Comments: Skin looks good today.   Neurological:     Mental Status: She is alert.  Psychiatric:        Behavior: Behavior is cooperative.      Diagnostic studies: none  Review of her labs: Normal prostaglandin D2 in the urine, normal N-methylhistamine urine, negative C-kit mutation, normal tryptase.   Salvatore Marvel, MD  [facility]

## 2022-10-08 ENCOUNTER — Other Ambulatory Visit: Payer: Self-pay | Admitting: Physician Assistant

## 2022-10-08 DIAGNOSIS — M3219 Other organ or system involvement in systemic lupus erythematosus: Secondary | ICD-10-CM

## 2022-10-10 DIAGNOSIS — F339 Major depressive disorder, recurrent, unspecified: Secondary | ICD-10-CM | POA: Diagnosis not present

## 2022-10-10 DIAGNOSIS — F902 Attention-deficit hyperactivity disorder, combined type: Secondary | ICD-10-CM | POA: Diagnosis not present

## 2022-10-10 DIAGNOSIS — F411 Generalized anxiety disorder: Secondary | ICD-10-CM | POA: Diagnosis not present

## 2022-10-10 NOTE — Telephone Encounter (Signed)
Next Visit: 11/30/2022  Last Visit: 05/26/2022  Labs: 05/26/2022 Creatinine is borderline elevated-1.03.  rest of CMP WNL.  Limit use of NSAIDs and remain hydrated. CBC WNL. ESR WNL.   Complements WNL. No proteinuria.  Eye exam: 10/05/2022   Current Dose per office note on 05/26/2022: Plaquenil 200 mg 1 tablet by mouth twice daily.    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

## 2022-10-11 ENCOUNTER — Encounter: Payer: Self-pay | Admitting: Orthopaedic Surgery

## 2022-10-11 ENCOUNTER — Other Ambulatory Visit: Payer: Self-pay

## 2022-10-11 ENCOUNTER — Encounter: Payer: Self-pay | Admitting: Sports Medicine

## 2022-10-11 DIAGNOSIS — G8929 Other chronic pain: Secondary | ICD-10-CM

## 2022-10-11 NOTE — Telephone Encounter (Signed)
Please set up for another shoulder injection with brooks.  Glenohumeral.  Thanks.

## 2022-10-12 ENCOUNTER — Ambulatory Visit: Payer: BC Managed Care – PPO | Admitting: Rehabilitative and Restorative Service Providers"

## 2022-10-12 ENCOUNTER — Encounter: Payer: Self-pay | Admitting: Rehabilitative and Restorative Service Providers"

## 2022-10-12 DIAGNOSIS — R262 Difficulty in walking, not elsewhere classified: Secondary | ICD-10-CM

## 2022-10-12 DIAGNOSIS — G8929 Other chronic pain: Secondary | ICD-10-CM

## 2022-10-12 DIAGNOSIS — M25511 Pain in right shoulder: Secondary | ICD-10-CM

## 2022-10-12 DIAGNOSIS — M6281 Muscle weakness (generalized): Secondary | ICD-10-CM

## 2022-10-12 DIAGNOSIS — M79605 Pain in left leg: Secondary | ICD-10-CM

## 2022-10-12 NOTE — Therapy (Signed)
OUTPATIENT PHYSICAL THERAPY TREATMENT NOTE/RECERT/PROGRESS NOTE   Patient Name: Robin [name] MRN: [id number] DOB:1970-10-11, 52 y.o., female Today's Date: 10/12/2022  Progress Note Reporting Period 08/24/2023 to 10/12/2022  See note below for Objective Data and Assessment of Progress/Goals.      END OF SESSION:   PT End of Session - 10/12/22 1015     Visit Number 8    Number of Visits 23    Date for PT Re-Evaluation 12/07/22    Authorization Type [employer] $40 copay    Authorization - Number of Visits --    PT Start Time 2671    PT Stop Time 1053    PT Time Calculation (min) 39 min    Activity Tolerance Patient tolerated treatment well    Behavior During Therapy WFL for tasks assessed/performed                  Past Medical History:  Diagnosis Date   ADD (attention deficit disorder)    Allergy    Anemia    Anorexia nervosa with bulimia    Per [facility]   Anxiety    Asthma    Carpal tunnel syndrome    Per [facility]   Depression    Dry eyes    Per [facility]   Dry mouth    Per [facility] Patient Packet   Fibroids    Per [facility] Patient Packet   History of hip x-ray 2021   Per [facility]   History of Papanicolaou smear of cervix 2021   Per [facility] Packet, Dr.Pickens   Hyperlipidemia    Ileitis    Lupus ([place])    PONV (postoperative nausea and vomiting)    mild nausea    Raynaud disease    Retinal tear    Per [facility] New Patient Packet   Systemic lupus erythematosus ([place])    Past Surgical History:  Procedure Laterality Date   CARPAL TUNNEL RELEASE Right    08/2019   CESAREAN SECTION  2003   CESAREAN SECTION  2005   Per [facility] New Patient Packet   COLONOSCOPY  2017   Dr.Jacobs   cyst drained Left 2023   knee   DENTAL SURGERY  2009   Fromed tooth and chronic infection   DILITATION & CURRETTAGE/HYSTROSCOPY WITH NOVASURE ABLATION N/A 06/03/2020   Procedure: DILATATION & CURETTAGE/HYSTEROSCOPY  WITH NOVASURE ABLATION;  Surgeon: Aletha Halim, MD;  Location: [facility];  Service: Gynecology;  Laterality: N/A;   ENDOMETRIAL BIOPSY  04/20/2020       EYE SURGERY Bilateral    cryoretinopexy   Patient Active Problem List   Diagnosis Date Noted   Left hamstring injury 07/15/2022   Labral tear of shoulder, degenerative, right 06/21/2022   Left carpal tunnel syndrome 05/26/2022   Foot sprain, left, initial encounter 05/19/2022   Baker's cyst of knee, left 04/21/2022   Right carpal tunnel syndrome 03/16/2022   Flushing 04/14/2021   Hypermobility syndrome 11/27/2020   Seasonal and perennial allergic rhinitis 11/06/2018   Mild intermittent asthma without complication 24/58/0998   Flexural atopic dermatitis 11/06/2018   History of hyperlipidemia 11/08/2016   History of anxiety 11/08/2016   History of retinal tear 11/08/2016   History of Bilateral plantar fasciitis 08/10/2016   High risk medication use 07/04/2016   Lupus ([place]) 05/23/2016   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[place]) 03/18/2016   ADHD (attention deficit hyperactivity disorder) 12/22/2014   Raynaud's phenomenon  10/17/2013   Hyperlipidemia 09/28/2011     THERAPY DIAG:  Chronic right shoulder pain  Pain in left leg  Muscle weakness (generalized)  Difficulty in walking, not elsewhere classified  PCP: Ngetich, Nelda Bucks NP   REFERRING PROVIDER: Leandrew Koyanagi, MD   REFERRING DIAG: S76.302A (ICD-10-CM) - Left hamstring injury, initial encounter    Rationale for Evaluation and Treatment: Rehabilitation   ONSET DATE: Around 07/13/2022 for hamstring, several years for other complaints of Rt shoulder pain and back pain   SUBJECTIVE:    SUBJECTIVE STATEMENT: She had extended break due to COVID positive.   She indicated having some improvement in Rt shoulder after initial injection and last treatment but has had returned trouble.  She indicated grinding and some popping in Rt shoulder.  Reported sore and waking up  at night due to symptoms in Rt shoulder.   Has some complaints of Lt shoulder as well due to increased activity.   Reported complaints in back of shoulder/ traps and into Rt arm.   She reported the hamstring is a lot better overall, a little tight still.  She mentioned prior to Christmas, feeling better in back as well.  Time stuck sitting and in room with COVID caused some return.   Reported baker's cyst present recently.    PERTINENT HISTORY: Lt hamstring tear grade 1. Bakers Cyst per MD note.  Med history : lupus  NEW MRI Rt shoulder IMPRESSION: 1. Moderate tendinosis of the supraspinatus tendon with a partial-thickness bursal surface tear and a partial-thickness articular surface tear. 2. Mild tendinosis of the infraspinatus tendon. 3. Mild subacromial/subdeltoid bursitis.   PAIN:  Pain location: at worst 9/10 pain in right shoulder posteriorly at current today 7/10 Description:  tenderness, tightness, spasms Aggravating factors: Rt shoulder: preventative reaching, wiping, throwing, pressure. Relieving factors: taping, stretching, previous treatment, injection helped   PRECAUTIONS: None   WEIGHT BEARING RESTRICTIONS: No   FALLS:  Has patient fallen in last 6 months? Yes 2x - Outside of house Pt indicated a fear of falling.     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  Heal hamstring completely     OBJECTIVE:      PATIENT SURVEYS:  08/16/2022: FOTO update 49  07/26/2022 FOTO intake:31 predicted:  73   COGNITION: 07/26/2022 Overall cognitive status: WFL                        SENSATION: 07/26/2022 WFL   MUSCLE LENGTH: 07/26/2022 Hamstrings passive SLR supine: Right 90 deg; Left 70  deg c pain   POSTURE:  07/26/2022 unremarkable in standing   PALPATION: 07/26/2022 Tenderness noted to light touch in Lt lumbar paraspinals   ROM:    ROM 07/26/2022 Right 07/26/2022 Left 07/26/2022 08/09/2022 Right 08/23/22  Shoulder flexion        WNL  Shoulder abduction        WNL  Shoulder internal rotation        WNL  Shoulder external rotation        WNL         Knee flexion          Knee extension  Lumbar extension 75% WFL c end range back complaints   Repeated x 5 in standing c ERP worsened     75% WFL c tightness lower back (more on Lt)   Lumbar flexion To floor no complaints     Hands flat to floor               (Blank rows = not tested)   UPPER EXTREMITY ROM:  ROM Right eval Left eval  Shoulder flexion  WFL with painful arc in sitting and supine  Shoulder extension    Shoulder abduction    Shoulder adduction    Shoulder extension    Shoulder internal rotation  60 AROM in 45 deg abduction  Shoulder external rotation  80 AROM in 45 deg abduction   Elbow flexion    Elbow extension    Wrist flexion    Wrist extension    Wrist ulnar deviation    Wrist radial deviation    Wrist pronation    Wrist supination     (Blank rows = not tested)    LOWER EXTREMITY MMT:   MMT Right 07/26/2022 Left 07/26/2022 Left 07/28/22 Left 08/09/2022 Left 1/31/204  Hip flexion 5/5 5/5     Hip extension Check next visit Check next visit 4/5    Hip abduction Check next visit Check next visit 4/5    Hip adduction         Hip internal rotation         Hip external rotation         Knee flexion 5/5   39, 36.4 lbs   3+/5 14 lbs With  pain   4/5 20.6, 18.4 lbs 4/5 26, 23 lbs  Knee extension 5/5 5/5     Ankle dorsiflexion 5/5 5/5     Ankle plantarflexion         Ankle inversion         Ankle eversion          (Blank rows = not tested)  UPPER EXTREMITY MMT:  MMT Right 08/23/22 Right 10/12/2021  Shoulder flexion 5 5/5 c pain  Shoulder  extension    Shoulder abduction 4 3+/5 c pain  Shoulder adduction    Shoulder extension    Shoulder internal rotation 4 5/5  Shoulder external rotation 4- 4/5 c pain  Middle trapezius    Lower trapezius    Elbow flexion    Elbow extension    Wrist flexion    Wrist extension    Wrist ulnar deviation    Wrist radial deviation    Wrist pronation    Wrist supination    Grip strength     (Blank rows = not tested)    SPECIAL TESTS:  07/26/2022 Lt slump test positive for complaints in thigh and calf, Negative on Rt Negative crossed slr bilateral for any radicular pain   FUNCTIONAL TESTS:  08/16/2022:  Lt SLS:  18 seconds  07/26/2022 18 inch chair transfer:  1st try without hands Lt SLS:  10 seconds, stopped due to back related complaints Rt SLS:  30 seconds c mild aberrant movement   GAIT: 07/26/2022 Independent ambulation      TODAY'S TREATMENT  10/12/2022: Manual: Compression to Rt infraspinatus  Trigger Point Dry-Needling  Treatment instructions: Expect mild to moderate muscle soreness. S/S of pneumothorax if dry needled over a lung field, and to seek immediate medical attention should they occur. Patient verbalized understanding of these instructions and education.  Patient Consent Given: Yes Education  handout provided: Previously provided Muscles treated: Rt infraspinatus Treatment response/outcome: twitch response c concordant symptoms.   Therex:  Rt arm ER AROM 2 x 15 in Lt sidelying UBE fwd/back 3 mins each way lvl 3.0 Cross arm stretch Rt arm 15 sec x 3  Review of existing HEP c verbal review and handout provided.  Additional time spent in review and returning to activity post illness.    08/23/2022 Therex: Rt shoulder isometrics 5 sec X 10 each for abduction, ER, IR Lt LE nerve glide/floss X 20  Lt hamstring stretching 5 sec X 10 Supine bridges with feet on red ball 5 sec X 10 Supine bridge into hamstring curl with feet on red ball 2X5  Manual  therapy for skilled palpation and Trigger Point Dry-Needling  Treatment instructions: Expect mild to moderate muscle soreness. Patient Consent Given: Yes Education handout provided: previously Muscles treated: Lt hamstrings, Rt supraspinatus, Rt infraspinatus, Rt subscapularis, Rt deltoid/RTC insertion Treatment response/outcome: good overall tolerance,twitch response noted     PATIENT EDUCATION:   10/12/2022 Education details: HEP update Person educated: Patient Education method: Consulting civil engineer, Demonstration, Verbal cues, and Handouts Education comprehension: verbalized understanding, returned demonstration, and verbal cues required   HOME EXERCISE PROGRAM: Access Code: [email or web address] URL: [email or web address] Date: 10/12/2022 Prepared by: Scot Jun  Exercises - Supine Bridge  - 1-2 x daily - 7 x weekly - 1-2 sets - 10 reps - 2 hold - Supine 90/90 Sciatic Nerve Glide with Knee Flexion/Extension  - 1-2 x daily - 7 x weekly - 1-2 sets - 10 reps - Sidelying Hip Abduction  - 1-2 x daily - 7 x weekly - 2-3 sets - 10-15 reps - Standing Hip Hiking  - 1-2 x daily - 7 x weekly - 1 sets - 10 reps - 5 hold - Seated Hamstring Set (Mirrored)  - 2-3 x daily - 7 x weekly - 1 sets - 10-15 reps - 5-10 hold - Prone Alternating Arm and Leg Lifts  - 1-2 x daily - 7 x weekly - 1-2 sets - 10 reps - 3 hold - Sidelying Lumbar Rotation Stretch (Mirrored)  - 2-3 x daily - 7 x weekly - 1 sets - 5 reps - 15 hold - Standing Isometric Shoulder Internal Rotation at Doorway  - 2 x daily - 6 x weekly - 1-2 sets - 10 reps - 5 hold - Standing Isometric Shoulder External Rotation with Doorway (Mirrored)  - 2 x daily - 6 x weekly - 1-2 sets - 10 reps - 5 hold - Standing Isometric Shoulder Flexion with Doorway - Arm Bent  - 2 x daily - 6 x weekly - 1-2 sets - 10 reps - 5 hold - Standing Isometric Shoulder Abduction with Doorway - Arm Bent (Mirrored)  - 2 x daily - 6 x weekly - 1-2 sets - 10 reps - 5 hold -  Sidelying Shoulder External Rotation (Mirrored)  - 1-2 x daily - 7 x weekly - 2-3 sets - 10-15 reps - Standing Shoulder Posterior Capsule Stretch (Mirrored)  - 2-3 x daily - 7 x weekly - 1 sets - 5 reps - 15-30 hold   ASSESSMENT:   CLINICAL IMPRESSION: Pt returned today after extended period of no treatment due to recent COVID illness.  Return today showed complaints of Rt shoulder pain, Lt back/leg symptoms c mobility, strength deficits that impair her daily functional and recreational activity.  Continued skilled PT services indicated at this time to address   Recert today to establish new  POC dates.   OBJECTIVE IMPAIRMENTS: decreased activity tolerance, decreased balance, decreased coordination, decreased endurance, decreased mobility, difficulty walking, decreased ROM, decreased strength, increased fascial restrictions, impaired perceived functional ability, increased muscle spasms, impaired flexibility, improper body mechanics, and pain.    ACTIVITY LIMITATIONS: carrying, lifting, bending, standing, squatting, stairs, transfers, and locomotion level   PARTICIPATION LIMITATIONS: cleaning, interpersonal relationship, community activity, occupation, yard work, and exercise routine, walking dog   PERSONAL FACTORS: Time since onset of injury/illness/exacerbation and multiple treatment areas, lupus  are also affecting patient's functional outcome.    REHAB POTENTIAL: Good   CLINICAL DECISION MAKING: Stable/uncomplicated   EVALUATION COMPLEXITY: Low     GOALS: Goals reviewed with patient? Yes   SHORT TERM GOALS: (target date for Short term goals are 3 weeks 08/16/2022)    1.  Patient will demonstrate independent use of home exercise program to maintain progress from in clinic treatments.   Goal status: Met 08/16/2022   LONG TERM GOALS: (target dates for all long term goals are 10 weeks  12/07/2022 )   1. Patient will demonstrate/report pain at worst less than or equal to 2/10 to  facilitate minimal limitation in daily activity secondary to pain symptoms.   Goal status: revised 10/12/2022   2. Patient will demonstrate independent use of home exercise program to facilitate ability to maintain/progress functional gains from skilled physical therapy services.   Goal status: revised 10/12/2022   3. Patient will demonstrate FOTO outcome > or = 58 % to indicate reduced disability due to condition.   Goal status: revised 10/12/2022   4.  Patient will demonstrate Lt LE MMT 5/5 , dynamometry within 15 % of Rt, Rt shoulder MMT 5/5 throughout to faciltiate usual transfers, stairs, squatting at [facility] for daily life.    Goal status: revised 10/12/2022   5.  Patient will demonstrate Lt passive hamstring SLR to 90 deg equal to Rt s symptoms for normal mobility.  Goal status: revised 10/12/2022   6.  Patient will demonstrate bilateral SLS > 30 seconds to facilitate stability in ambulation on even and uneven ground.  Goal status: revised 10/12/2022   7.  Patient will demonstrate lumbar AROM WFL s symptoms to facilitate usual mobility at PLOF in standing/walking.  Goal Status: revised 10/12/2022    PLAN:   PT FREQUENCY: 1-2x/week   PT DURATION: 8 weeks   PLANNED INTERVENTIONS: Therapeutic exercises, Therapeutic activity, Neuro Muscular re-education, Balance training, Gait training, Patient/Family education, Joint mobilization, Stair training, DME instructions, Dry Needling, Electrical stimulation, Traction, Cryotherapy, vasopneumatic deviceMoist heat, Taping, Ultrasound, Ionotophoresis '4mg'$ /ml Dexamethasone, and Manual therapy.  All included unless contraindicated   PLAN FOR NEXT SESSION: Dry needling follow up as desired. Progressive Rt shoulder strengthening/stabilizations.    Scot Jun, PT, DPT, OCS, ATC 10/12/22  12:02 PM

## 2022-10-13 ENCOUNTER — Encounter: Payer: Self-pay | Admitting: Allergy & Immunology

## 2022-10-14 ENCOUNTER — Ambulatory Visit: Payer: Self-pay

## 2022-10-14 ENCOUNTER — Encounter: Payer: Self-pay | Admitting: Sports Medicine

## 2022-10-14 ENCOUNTER — Ambulatory Visit: Payer: BC Managed Care – PPO | Admitting: Sports Medicine

## 2022-10-14 DIAGNOSIS — M25862 Other specified joint disorders, left knee: Secondary | ICD-10-CM

## 2022-10-14 DIAGNOSIS — M25462 Effusion, left knee: Secondary | ICD-10-CM

## 2022-10-14 DIAGNOSIS — M25562 Pain in left knee: Secondary | ICD-10-CM

## 2022-10-14 DIAGNOSIS — M7122 Synovial cyst of popliteal space [Baker], left knee: Secondary | ICD-10-CM | POA: Diagnosis not present

## 2022-10-14 MED ORDER — LIDOCAINE HCL 1 % IJ SOLN
3.0000 mL | INTRAMUSCULAR | Status: AC | PRN
  Administered 2022-10-14: 3 mL

## 2022-10-14 NOTE — Progress Notes (Signed)
REBBECA SHEPERD - 52 y.o. female MRN [id number]  Date of birth: 1971/08/07  Office Visit Note: Visit Date: 10/14/2022 PCP: Sandrea Hughs, NP Referred by: Leandrew Koyanagi, MD  Subjective: Chief Complaint  Patient presents with   Left Knee - Pain   HPI: CLIFTON KOVACIC is a pleasant 52 y.o. female who presents today for left knee swelling and recurrent Baker's cyst.  This is now the third time she has had a recurrent swelling in the back of the knee.  She has had 2 prior Baker's cyst aspirations with subsequent corticosteroid injection.  Her leg and knee had been doing well for many weeks, although at the start of the year she felt swelling coming back.  She continues with therapy and dry needling.  Also having repeat pain right shoulder pain.  Is interested in repeat injection.  We will get that scheduled.  Pertinent ROS were reviewed with the patient and found to be negative unless otherwise specified above in HPI.   Assessment & Plan: Visit Diagnoses:  1. Baker's cyst of knee, left   2. Knee joint cyst, left   3. Pain and swelling of left knee    Plan: Discussed with Shareka the possible etiology of her her recurrent Baker's cyst.  She has now had 3 recurrent episodes with subsequent aspiration and previous injections with recurrence of this.  We did discuss that Baker's cyst is usually a sign of some intra-articular pathology emanating from the knee that causes these.  Through shared decision making, elected to proceed with aspiration of the cyst which yielded a serious and bloody yield.  Given the recurrence and the bloody portion today, we did send this for fluid analysis.  Recommended compression sleeve for the knee to prevent reoccurrence.  May take ibuprofen over-the-counter anti-inflammatories over the next couple days for pain and swelling.  A limited ultrasound showed hyperemia around the cyst collection but the rest of the knee exam was benign.  Some she has preserved joint  space on her x-rays, there is some patellofemoral arthritic change.  At this point given the recurrent cyst effusions, next step would be obtaining an MRI scan of the knee to evaluate for intra-articular pathology and a cause of these recurrent fluid collections. She will follow-up after MRI scan. May schedule an intra-articular GHJ injection at her leisure.  Follow-up: Return for f/u after MRI scan for knee.   Meds & Orders: No orders of the defined types were placed in this encounter.   Orders Placed This Encounter  Procedures   Anaerobic and Aerobic Culture   Anaerobic and Aerobic Culture   [place] Extrem Low Left Ltd   MR Knee Left w/o contrast   Synovial Fluid Analysis, Complete   Synovial Fluid Analysis, Complete     Procedures: Large Joint Inj: L knee on 10/14/2022 10:01 AM Indications: joint swelling and diagnostic evaluation Details: 18 G 1.5 in needle, ultrasound-guided posterior approach Medications: 3 mL lidocaine 1 % Aspirate: 18 mL serous and bloody; sent for lab analysis Outcome: tolerated well, no immediate complications  After discussion on risks/benefits/indications, informed verbal consent was obtained and a timeout was performed. The patient was lying prone on exam table and the patient's knee was prepped with Chloraprep and alcohol swabs. Utilizing ultrasound-guidance with the probe in a longitudinal position, the Baker's cyst was identified and the surrounding soft tissue area was anesthesized first with a 25G, 1.5" needle with approximately 3 cc of lidocaine 1% using sterile technique. Following  this, using ultrasound guidance via an in-plane approach, an 18G, 1.5" needle was directed into cyst and approximately 18 cc's of serous, bloody fluid was aspirated from the cyst and posterior knee joint. No injectate was given into the knee. A band-aid was applied and compression wrapped applied. Patient tolerated the procedure well without immediate complications.  Procedure,  treatment alternatives, risks and benefits explained, specific risks discussed. Consent was given by the patient. Immediately prior to procedure a time out was called to verify the correct patient, procedure, equipment, support staff and site/side marked as required. Patient was prepped and draped in the usual sterile fashion.          Clinical History: No specialty comments available.  She reports that she quit smoking about 21 years ago. Her smoking use included cigarettes. She has a 7.50 pack-year smoking history. She has never been exposed to tobacco smoke. She has never used smokeless tobacco. No results for input(s): "HGBA1C", "LABURIC" in the last 8760 hours.  Objective:    Physical Exam  Gen: Well-appearing, in no acute distress; non-toxic CV:  Well-perfused. Warm.  Resp: Breathing unlabored on room air; no wheezing. Psych: Fluid speech in conversation; appropriate affect; normal thought process Neuro: Sensation intact throughout. No gross coordination deficits.   Ortho Exam - Left knee: There is no effusion or swelling about the knee.  No medial or lateral joint line TTP.  There is positive pain and a fullness within the posterior aspect of the knee.  There is a palpable mass, likely indicative of a Baker's cyst.  There is some mild pain with endrange flexion but full range flexion and extension.  No varus or valgus instability.  Negative McMurray's testing.  Negative anterior and posterior drawer.  Imaging: [place] Extrem Low Left Ltd  Result Date: 10/14/2022 Limited musculoskeletal ultrasound of the left knee was performed today.  Evaluation of the popliteal fossa shows appropriate neurovasculature without abnormality.  There is a large hypoechoic fluid collection with multiple septations within the knee and medial head of the gastrocnemius tendon.  Hypoechoic fluid collection is 5.04 x 2.52 cm in nature.  There is notable hyperemia surrounding this collection.  Evaluation of the  anterior knee shows no cortical defect of the superior patella.  Quadricep tendon intact in long and short axis without evidence of tearing or tendinopathy.  There is no suprapatellar joint effusion. Technically successful ultrasound-guided Baker's cyst aspiration.       Past Medical/Family/Surgical/Social History: Medications & Allergies reviewed per EMR, new medications updated. Patient Active Problem List   Diagnosis Date Noted   Left hamstring injury 07/15/2022   Labral tear of shoulder, degenerative, right 06/21/2022   Left carpal tunnel syndrome 05/26/2022   Foot sprain, left, initial encounter 05/19/2022   Baker's cyst of knee, left 04/21/2022   Right carpal tunnel syndrome 03/16/2022   Flushing 04/14/2021   Hypermobility syndrome 11/27/2020   Seasonal and perennial allergic rhinitis 11/06/2018   Mild intermittent asthma without complication 11/94/1740   Flexural atopic dermatitis 11/06/2018   History of hyperlipidemia 11/08/2016   History of anxiety 11/08/2016   History of retinal tear 11/08/2016   History of Bilateral plantar fasciitis 08/10/2016   High risk medication use 07/04/2016   Lupus ([place]) 05/23/2016   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[place]) 03/18/2016   ADHD (attention deficit hyperactivity disorder) 12/22/2014   Raynaud's phenomenon 10/17/2013   Hyperlipidemia 09/28/2011   Past Medical History:  Diagnosis Date   ADD (attention deficit disorder)    Allergy    Anemia  Anorexia nervosa with bulimia    Per [place] [facility]   Anxiety    Asthma    Carpal tunnel syndrome    Per [place] [facility]   Depression    Dry eyes    Per [place] [facility]   Dry mouth    Per [place] [facility]   Fibroids    Per [place] [facility]   History of hip x-ray 2021   Per [place] [facility]   History of Papanicolaou smear of cervix 2021   Per [place] [facility], Dr.Pickens   Hyperlipidemia    Ileitis    Lupus ([place])    PONV (postoperative nausea and  vomiting)    mild nausea    Raynaud disease    Retinal tear    Per [place] [facility]   Systemic lupus erythematosus ([place])    Family History  Problem Relation Age of Onset   Heart disease Mother 31       CAD/stenting   Hyperlipidemia Mother    Arthritis Mother        ostearthritis   Hypertension Mother    Kidney disease Mother    Irritable bowel syndrome Mother    Stroke Mother 10       CVA   Fibromyalgia Mother    Eczema Mother    Sjogren's syndrome Mother    Hyperlipidemia Father    Neuropathy Father    Allergies Father        shellfish   Prostatitis Father    Hyperlipidemia Sister    ADD / ADHD Sister    Bipolar disorder Sister    Depression Sister    Hypertension Brother    OCD Brother    Anxiety disorder Daughter    ADD / ADHD Daughter    Asthma Daughter    OCD Son    Colon cancer Neg Hx    Inflammatory bowel disease Neg Hx    Allergic rhinitis Neg Hx    Angioedema Neg Hx    Atopy Neg Hx    Immunodeficiency Neg Hx    Urticaria Neg Hx    Past Surgical History:  Procedure Laterality Date   CARPAL TUNNEL RELEASE Right    08/2019   CESAREAN SECTION  2003   CESAREAN SECTION  2005   Per [facility]   COLONOSCOPY  2017   Dr.Jacobs   cyst drained Left 2023   knee   DENTAL SURGERY  2009   Fromed tooth and chronic infection   DILITATION & CURRETTAGE/HYSTROSCOPY WITH NOVASURE ABLATION N/A 06/03/2020   Procedure: DILATATION & CURETTAGE/HYSTEROSCOPY WITH NOVASURE ABLATION;  Surgeon: Aletha Halim, MD;  Location: [place];  Service: Gynecology;  Laterality: N/A;   ENDOMETRIAL BIOPSY  04/20/2020       EYE SURGERY Bilateral    cryoretinopexy   Social History   Occupational History   Not on file  Tobacco Use   Smoking status: Former    Packs/day: 0.75    Years: 10.00    Total pack years: 7.50    Types: Cigarettes    Quit date: 07/05/2001    Years since quitting: 21.2    Passive exposure: Never   Smokeless tobacco:  Never  Vaping Use   Vaping Use: Never used  Substance and Sexual Activity   Alcohol use: Yes    Comment: occ   Drug use: Never   Sexual activity: Yes    Birth control/protection: Condom

## 2022-10-17 ENCOUNTER — Ambulatory Visit: Payer: BC Managed Care – PPO | Admitting: Sports Medicine

## 2022-10-17 ENCOUNTER — Encounter: Payer: Self-pay | Admitting: Sports Medicine

## 2022-10-17 ENCOUNTER — Ambulatory Visit: Payer: Self-pay

## 2022-10-17 DIAGNOSIS — M25511 Pain in right shoulder: Secondary | ICD-10-CM | POA: Diagnosis not present

## 2022-10-17 DIAGNOSIS — G8929 Other chronic pain: Secondary | ICD-10-CM

## 2022-10-17 MED ORDER — METHYLPREDNISOLONE ACETATE 40 MG/ML IJ SUSP
80.0000 mg | INTRAMUSCULAR | Status: AC | PRN
  Administered 2022-10-17: 80 mg via INTRA_ARTICULAR

## 2022-10-17 MED ORDER — LIDOCAINE HCL 1 % IJ SOLN
2.0000 mL | INTRAMUSCULAR | Status: AC | PRN
  Administered 2022-10-17: 2 mL

## 2022-10-17 MED ORDER — BUPIVACAINE HCL 0.25 % IJ SOLN
2.0000 mL | INTRAMUSCULAR | Status: AC | PRN
  Administered 2022-10-17: 2 mL via INTRA_ARTICULAR

## 2022-10-17 NOTE — Progress Notes (Signed)
   Procedure Note  Patient: Robin [name]             Date of Birth: 06/09/1971           MRN: [id number]             Visit Date: 10/17/2022  Procedures: Visit Diagnoses:  1. Chronic right shoulder pain    Large Joint Inj: R glenohumeral on 10/17/2022 8:15 AM Indications: pain Details: 22 G 3.5 in needle, ultrasound-guided posterior approach Medications: 2 mL lidocaine 1 %; 2 mL bupivacaine 0.25 %; 80 mg methylPREDNISolone acetate 40 MG/ML Outcome: tolerated well, no immediate complications Procedure, treatment alternatives, risks and benefits explained, specific risks discussed. Consent was given by the patient. Immediately prior to procedure a time out was called to verify the correct patient, procedure, equipment, support staff and site/side marked as required. Patient was prepped and draped in the usual sterile fashion.     - I evaluated the patient about 10 minutes post-injection and she had good improvement in pain and range of motion - follow-up with Dr. Erlinda Hong as indicated for the shoulder; I am happy to see them as needed - has not received call for MRI of left knee, should be by this week   Elba Barman, DO [place]  This note was dictated using Dragon naturally speaking software and may contain errors in syntax, spelling, or content which have not been identified prior to signing this note.

## 2022-10-18 DIAGNOSIS — M67962 Unspecified disorder of synovium and tendon, left lower leg: Secondary | ICD-10-CM | POA: Diagnosis not present

## 2022-10-18 DIAGNOSIS — M25462 Effusion, left knee: Secondary | ICD-10-CM | POA: Diagnosis not present

## 2022-10-18 DIAGNOSIS — M2242 Chondromalacia patellae, left knee: Secondary | ICD-10-CM | POA: Diagnosis not present

## 2022-10-18 DIAGNOSIS — R600 Localized edema: Secondary | ICD-10-CM | POA: Diagnosis not present

## 2022-10-19 DIAGNOSIS — F331 Major depressive disorder, recurrent, moderate: Secondary | ICD-10-CM | POA: Diagnosis not present

## 2022-10-20 LAB — SYNOVIAL FLUID ANALYSIS, COMPLETE
Basophils, %: 0 %
Eosinophils-Synovial: 0 % (ref 0–2)
Lymphocytes-Synovial Fld: 40 % (ref 0–74)
Monocyte/Macrophage: 60 % (ref 0–69)
Neutrophil, Synovial: 0 % (ref 0–24)
Synoviocytes, %: 0 % (ref 0–15)
WBC, Synovial: 779 cells/uL — ABNORMAL HIGH (ref <150)

## 2022-10-20 LAB — ANAEROBIC AND AEROBIC CULTURE
AER RESULT:: NO GROWTH
MICRO NUMBER:: 14512800
MICRO NUMBER:: 14512801
SPECIMEN QUALITY:: ADEQUATE
SPECIMEN QUALITY:: ADEQUATE

## 2022-10-21 ENCOUNTER — Encounter
Payer: BC Managed Care – PPO | Attending: Physical Medicine & Rehabilitation | Admitting: Physical Medicine & Rehabilitation

## 2022-10-21 ENCOUNTER — Encounter: Payer: Self-pay | Admitting: Physical Medicine & Rehabilitation

## 2022-10-21 ENCOUNTER — Ambulatory Visit: Payer: BC Managed Care – PPO | Admitting: Physical Therapy

## 2022-10-21 VITALS — BP 126/73 | HR 78 | Ht 66.0 in | Wt 160.0 lb

## 2022-10-21 DIAGNOSIS — G8929 Other chronic pain: Secondary | ICD-10-CM

## 2022-10-21 DIAGNOSIS — M79605 Pain in left leg: Secondary | ICD-10-CM

## 2022-10-21 DIAGNOSIS — R262 Difficulty in walking, not elsewhere classified: Secondary | ICD-10-CM | POA: Diagnosis not present

## 2022-10-21 DIAGNOSIS — R5381 Other malaise: Secondary | ICD-10-CM | POA: Insufficient documentation

## 2022-10-21 DIAGNOSIS — M24571 Contracture, right ankle: Secondary | ICD-10-CM | POA: Insufficient documentation

## 2022-10-21 DIAGNOSIS — I7300 Raynaud's syndrome without gangrene: Secondary | ICD-10-CM | POA: Insufficient documentation

## 2022-10-21 DIAGNOSIS — M6281 Muscle weakness (generalized): Secondary | ICD-10-CM | POA: Diagnosis not present

## 2022-10-21 DIAGNOSIS — M7122 Synovial cyst of popliteal space [Baker], left knee: Secondary | ICD-10-CM | POA: Diagnosis not present

## 2022-10-21 DIAGNOSIS — M25511 Pain in right shoulder: Secondary | ICD-10-CM | POA: Diagnosis not present

## 2022-10-21 NOTE — Therapy (Signed)
OUTPATIENT PHYSICAL THERAPY TREATMENT NOTE   Patient Name: Robin [name] MRN: [id number] DOB:[birth year], 52 y.o., female Today's Date: 10/21/2022    END OF SESSION:   PT End of Session - 10/21/22 1100     Visit Number 9    Number of Visits 23    Date for PT Re-Evaluation 12/07/22    Authorization Type [employer] $40 copay    PT Start Time 1059    PT Stop Time 1143    PT Time Calculation (min) 44 min    Activity Tolerance Patient tolerated treatment well    Behavior During Therapy WFL for tasks assessed/performed                  Past Medical History:  Diagnosis Date   ADD (attention deficit disorder)    Allergy    Anemia    Anorexia nervosa with bulimia    Per [facility]   Anxiety    Asthma    Carpal tunnel syndrome    Per [facility]   Depression    Dry eyes    Per [facility]   Dry mouth    Per [facility] Packet   Fibroids    Per [facility]   History of hip x-ray 2021   Per [facility]   History of Papanicolaou smear of cervix 2021   Per [facility] Packet, Dr.Pickens   Hyperlipidemia    Ileitis    Lupus ([place])    PONV (postoperative nausea and vomiting)    mild nausea    Raynaud disease    Retinal tear    Per [facility] Patient Packet   Systemic lupus erythematosus ([place])    Past Surgical History:  Procedure Laterality Date   CARPAL TUNNEL RELEASE Right    08/2019   CESAREAN SECTION  2003   CESAREAN SECTION  2005   Per [facility] New Patient Packet   COLONOSCOPY  2017   Dr.Jacobs   cyst drained Left 2023   knee   DENTAL SURGERY  2009   Fromed tooth and chronic infection   DILITATION & CURRETTAGE/HYSTROSCOPY WITH NOVASURE ABLATION N/A 06/03/2020   Procedure: DILATATION & CURETTAGE/HYSTEROSCOPY WITH NOVASURE ABLATION;  Surgeon: Aletha Halim, MD;  Location: [facility];  Service: Gynecology;  Laterality: N/A;   ENDOMETRIAL BIOPSY  04/20/2020       EYE SURGERY Bilateral     cryoretinopexy   Patient Active Problem List   Diagnosis Date Noted   Left hamstring injury 07/15/2022   Labral tear of shoulder, degenerative, right 06/21/2022   Left carpal tunnel syndrome 05/26/2022   Foot sprain, left, initial encounter 05/19/2022   Baker's cyst of knee, left 04/21/2022   Right carpal tunnel syndrome 03/16/2022   Flushing 04/14/2021   Hypermobility syndrome 11/27/2020   Seasonal and perennial allergic rhinitis 11/06/2018   Mild intermittent asthma without complication [id number]   Flexural atopic dermatitis 11/06/2018   History of hyperlipidemia 11/08/2016   History of anxiety 11/08/2016   History of retinal tear 11/08/2016   History of Bilateral plantar fasciitis 08/10/2016   High risk medication use 07/04/2016   Lupus ([place]) 05/23/2016   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[place]) 03/18/2016   ADHD (attention deficit hyperactivity disorder) 12/22/2014   Raynaud's phenomenon 10/17/2013   Hyperlipidemia 09/28/2011     THERAPY DIAG:  Chronic right shoulder pain  Pain in left leg  Muscle weakness (generalized)  Difficulty in walking, not elsewhere  classified  PCP: Ngetich, Nelda Bucks NP   REFERRING PROVIDER: Leandrew Koyanagi, MD   REFERRING DIAG: [phone] (ICD-10-CM) - Left hamstring injury, initial encounter    Rationale for Evaluation and Treatment: Rehabilitation   ONSET DATE: Around 07/13/2022 for hamstring, several years for other complaints of Rt shoulder pain and back pain   SUBJECTIVE:    SUBJECTIVE STATEMENT: She relays she had an injection in Rt shoulder so she feels a little better after this. She had MRI on her left knee and is awaiting results about this. She feels the DN really helped last time and wants to have this again.   PERTINENT HISTORY: Lt hamstring tear grade 1. Bakers Cyst per MD note.  Med history : lupus  NEW MRI Rt shoulder IMPRESSION: 1. Moderate tendinosis of the supraspinatus tendon with a partial-thickness bursal surface tear and a  partial-thickness articular surface tear. 2. Mild tendinosis of the infraspinatus tendon. 3. Mild subacromial/subdeltoid bursitis.   PAIN:  Pain location: at worst 9/10 pain in right shoulder posteriorly at current today 7/10 Description:  tenderness, tightness, spasms Aggravating factors: Rt shoulder: preventative reaching, wiping, throwing, pressure. Relieving factors: taping, stretching, previous treatment, injection helped   PRECAUTIONS: None   WEIGHT BEARING RESTRICTIONS: No   FALLS:  Has patient fallen in last 6 months? Yes 2x - Outside of house Pt indicated a fear of falling.     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  Heal hamstring completely     OBJECTIVE:      PATIENT SURVEYS:  08/16/2022: FOTO update 49  07/26/2022 FOTO intake:31 predicted:  22   COGNITION: 07/26/2022 Overall cognitive status: WFL                        SENSATION: 07/26/2022 WFL   MUSCLE LENGTH: 07/26/2022 Hamstrings passive SLR supine: Right 90 deg; Left 70 deg c pain   POSTURE:  07/26/2022 unremarkable in standing   PALPATION: 07/26/2022 Tenderness noted to light touch in Lt lumbar paraspinals   ROM:    ROM 07/26/2022 Right 07/26/2022 Left 07/26/2022 08/09/2022 Right 08/23/22  Shoulder flexion        WNL  Shoulder abduction        WNL  Shoulder internal rotation        WNL  Shoulder external rotation        WNL         Knee flexion          Knee extension                     Lumbar extension 75% WFL c end range back complaints   Repeated x 5 in standing c ERP worsened     75% WFL c tightness lower back (more on Lt)   Lumbar flexion To floor no complaints     Hands flat to floor                (Blank rows = not tested)   UPPER EXTREMITY ROM:  ROM Right eval Left eval  Shoulder flexion  WFL with painful arc in sitting and supine  Shoulder extension    Shoulder abduction    Shoulder adduction  Shoulder extension    Shoulder internal rotation  60 AROM in 45 deg abduction  Shoulder external rotation  80 AROM in 45 deg abduction   Elbow flexion    Elbow extension    Wrist flexion    Wrist extension    Wrist ulnar deviation    Wrist radial deviation    Wrist pronation    Wrist supination     (Blank rows = not tested)    LOWER EXTREMITY MMT:   MMT Right 07/26/2022 Left 07/26/2022 Left 07/28/22 Left 08/09/2022 Left 1/31/204  Hip flexion 5/5 5/5     Hip extension Check next visit Check next visit 4/5    Hip abduction Check next visit Check next visit 4/5    Hip adduction         Hip internal rotation         Hip external rotation         Knee flexion 5/5   39, 36.4 lbs   3+/5 14 lbs With  pain   4/5 20.6, 18.4 lbs 4/5 26, 23 lbs  Knee extension 5/5 5/5     Ankle dorsiflexion 5/5 5/5     Ankle plantarflexion         Ankle inversion         Ankle eversion          (Blank rows = not tested)  UPPER EXTREMITY MMT:  MMT Right 08/23/22 Right 10/12/2021  Shoulder flexion 5 5/5 c pain  Shoulder extension    Shoulder abduction 4 3+/5 c pain  Shoulder adduction    Shoulder extension    Shoulder internal rotation 4 5/5  Shoulder external rotation 4- 4/5 c pain  Middle trapezius    Lower trapezius    Elbow flexion    Elbow extension    Wrist flexion    Wrist extension    Wrist ulnar deviation    Wrist radial deviation    Wrist pronation    Wrist supination    Grip strength     (Blank rows = not tested)    SPECIAL TESTS:  07/26/2022 Lt slump test positive for complaints in thigh and calf, Negative on Rt Negative crossed slr bilateral for any radicular pain   FUNCTIONAL TESTS:  08/16/2022:  Lt SLS:  18 seconds  07/26/2022 18 inch  chair transfer:  1st try without hands Lt SLS:  10 seconds, stopped due to back related complaints Rt SLS:  30 seconds c mild aberrant movement   GAIT: 07/26/2022 Independent ambulation      TODAY'S TREATMENT  10/21/2022: Manual: Compression applied and skilled palpation with DN  Trigger Point Dry-Needling  Treatment instructions: Expect mild to moderate muscle soreness. S/S of pneumothorax if dry needled over a lung field, and to seek immediate medical attention should they occur. Patient verbalized understanding of these instructions and education.  Patient Consent Given: Yes Education handout provided: Previously provided Muscles treated: Rt infraspinatus, supraspinatus, upper trap, deltoids, biceps, Lt lumbar P.S/mulitifidi, Lt QL Treatment response/outcome: twitch response c concordant symptoms.   Therex:  Shoulder isometrics 5 sec X 10 each bilat flexion, IR, ER, abd,  Rows and extensions with red X 20 each bilat Cross arm stretch bilat 15 sec x 3  10/12/2022: Manual: Compression to Rt infraspinatus  Trigger Point Dry-Needling  Treatment instructions: Expect mild to moderate muscle soreness. S/S of pneumothorax if dry needled over a lung field, and to seek immediate medical attention should they occur. Patient verbalized understanding of these  instructions and education.  Patient Consent Given: Yes Education handout provided: Previously provided Muscles treated: Rt infraspinatus Treatment response/outcome: twitch response c concordant symptoms.   Therex:  Rt arm ER AROM 2 x 15 in Lt sidelying UBE fwd/back 3 mins each way lvl 3.0 Cross arm stretch Rt arm 15 sec x 3  Review of existing HEP c verbal review and handout provided.  Additional time spent in review and returning to activity post illness.    08/23/2022 Therex: Rt shoulder isometrics 5 sec X 10 each for abduction, ER, IR Lt LE nerve glide/floss X 20  Lt hamstring stretching 5 sec X 10 Supine bridges with  feet on red ball 5 sec X 10 Supine bridge into hamstring curl with feet on red ball 2X5  Manual therapy for skilled palpation and Trigger Point Dry-Needling  Treatment instructions: Expect mild to moderate muscle soreness. Patient Consent Given: Yes Education handout provided: previously Muscles treated: Lt hamstrings, Rt supraspinatus, Rt infraspinatus, Rt subscapularis, Rt deltoid/RTC insertion Treatment response/outcome: good overall tolerance,twitch response noted     PATIENT EDUCATION:   10/12/2022 Education details: HEP update Person educated: Patient Education method: Consulting civil engineer, Demonstration, Verbal cues, and Handouts Education comprehension: verbalized understanding, returned demonstration, and verbal cues required   HOME EXERCISE PROGRAM: Access Code: [email or web address] URL: [email or web address] Date: 10/12/2022 Prepared by: Scot Jun  Exercises - Supine Bridge  - 1-2 x daily - 7 x weekly - 1-2 sets - 10 reps - 2 hold - Supine 90/90 Sciatic Nerve Glide with Knee Flexion/Extension  - 1-2 x daily - 7 x weekly - 1-2 sets - 10 reps - Sidelying Hip Abduction  - 1-2 x daily - 7 x weekly - 2-3 sets - 10-15 reps - Standing Hip Hiking  - 1-2 x daily - 7 x weekly - 1 sets - 10 reps - 5 hold - Seated Hamstring Set (Mirrored)  - 2-3 x daily - 7 x weekly - 1 sets - 10-15 reps - 5-10 hold - Prone Alternating Arm and Leg Lifts  - 1-2 x daily - 7 x weekly - 1-2 sets - 10 reps - 3 hold - Sidelying Lumbar Rotation Stretch (Mirrored)  - 2-3 x daily - 7 x weekly - 1 sets - 5 reps - 15 hold - Standing Isometric Shoulder Internal Rotation at Doorway  - 2 x daily - 6 x weekly - 1-2 sets - 10 reps - 5 hold - Standing Isometric Shoulder External Rotation with Doorway (Mirrored)  - 2 x daily - 6 x weekly - 1-2 sets - 10 reps - 5 hold - Standing Isometric Shoulder Flexion with Doorway - Arm Bent  - 2 x daily - 6 x weekly - 1-2 sets - 10 reps - 5 hold - Standing Isometric Shoulder  Abduction with Doorway - Arm Bent (Mirrored)  - 2 x daily - 6 x weekly - 1-2 sets - 10 reps - 5 hold - Sidelying Shoulder External Rotation (Mirrored)  - 1-2 x daily - 7 x weekly - 2-3 sets - 10-15 reps - Standing Shoulder Posterior Capsule Stretch (Mirrored)  - 2-3 x daily - 7 x weekly - 1 sets - 5 reps - 15-30 hold   ASSESSMENT:   CLINICAL IMPRESSION: She had good response to DN today to decrease pain and improve trigger points and tightness. We then focused on shoulder strength and scapular stability as tolerated. She will continue to benefit from skilled PT.  OBJECTIVE IMPAIRMENTS: decreased activity tolerance, decreased balance, decreased coordination, decreased endurance,  decreased mobility, difficulty walking, decreased ROM, decreased strength, increased fascial restrictions, impaired perceived functional ability, increased muscle spasms, impaired flexibility, improper body mechanics, and pain.    ACTIVITY LIMITATIONS: carrying, lifting, bending, standing, squatting, stairs, transfers, and locomotion level   PARTICIPATION LIMITATIONS: cleaning, interpersonal relationship, community activity, occupation, yard work, and exercise routine, walking dog   PERSONAL FACTORS: Time since onset of injury/illness/exacerbation and multiple treatment areas, lupus  are also affecting patient's functional outcome.    REHAB POTENTIAL: Good   CLINICAL DECISION MAKING: Stable/uncomplicated   EVALUATION COMPLEXITY: Low     GOALS: Goals reviewed with patient? Yes   SHORT TERM GOALS: (target date for Short term goals are 3 weeks 08/16/2022)    1.  Patient will demonstrate independent use of home exercise program to maintain progress from in clinic treatments.   Goal status: Met 08/16/2022   LONG TERM GOALS: (target dates for all long term goals are 10 weeks  12/07/2022 )   1. Patient will demonstrate/report pain at worst less than or equal to 2/10 to facilitate minimal limitation in daily activity  secondary to pain symptoms.   Goal status: revised 10/12/2022   2. Patient will demonstrate independent use of home exercise program to facilitate ability to maintain/progress functional gains from skilled physical therapy services.   Goal status: revised 10/12/2022   3. Patient will demonstrate FOTO outcome > or = 58 % to indicate reduced disability due to condition.   Goal status: revised 10/12/2022   4.  Patient will demonstrate Lt LE MMT 5/5 , dynamometry within 15 % of Rt, Rt shoulder MMT 5/5 throughout to faciltiate usual transfers, stairs, squatting at [facility] for daily life.    Goal status: revised 10/12/2022   5.  Patient will demonstrate Lt passive hamstring SLR to 90 deg equal to Rt s symptoms for normal mobility.  Goal status: revised 10/12/2022   6.  Patient will demonstrate bilateral SLS > 30 seconds to facilitate stability in ambulation on even and uneven ground.  Goal status: revised 10/12/2022   7.  Patient will demonstrate lumbar AROM WFL s symptoms to facilitate usual mobility at PLOF in standing/walking.  Goal Status: revised 10/12/2022    PLAN:   PT FREQUENCY: 1-2x/week   PT DURATION: 8 weeks   PLANNED INTERVENTIONS: Therapeutic exercises, Therapeutic activity, Neuro Muscular re-education, Balance training, Gait training, Patient/Family education, Joint mobilization, Stair training, DME instructions, Dry Needling, Electrical stimulation, Traction, Cryotherapy, vasopneumatic deviceMoist heat, Taping, Ultrasound, Ionotophoresis 27m/ml Dexamethasone, and Manual therapy.  All included unless contraindicated   PLAN FOR NEXT SESSION: Dry needling follow up as desired. Progressive Rt shoulder strengthening/stabilizations.   BElsie Ra PT, DPT 10/21/22 11:45 AM

## 2022-10-21 NOTE — Progress Notes (Signed)
Subjective:    Patient ID: Robin [name], female    DOB: 01-Aug-1971, 52 y.o.   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  The patient has had multiple musculoskeletal issues over the last several years including bilateral carpal tunnel syndrome, right carpal tunnel release, bilateral plantar fasciitis, right shoulder rotator cuff tendinosis, left hamstring injury left Baker's cyst.  She has received physical therapy for most of these diagnoses however would like to have a more unified approach. In addition to physical therapy the patient has seen a personal trainer in the past and has done some yoga.  Dry needling performed by physical therapy was helpful  Swelling in hand , seeing a mast cell specialist   Functionally she used to be able to walk 5 miles a day but is down to 2 miles a day and has both fatigue as well as left knee pain.  She has not tried bicycling. She complains of pain and fatigue after doing housework.  She is independent with all self-care and mobility.  Pain Inventory Average Pain 6 Pain Right Now 7 My pain is constant, sharp, burning, dull, stabbing, tingling, and aching  In the last 24 hours, has pain interfered with the following? General activity 4 Relation with others 7 Enjoyment of life 6 What TIME of day is your pain at its worst? morning , evening, and night Sleep (in general) Poor  Pain is worse with: walking, bending, sitting, inactivity, standing, and some activites Pain improves with: rest, therapy/exercise, pacing activities, medication, TENS, and heat Relief from Meds: 6  walk with assistance ability to climb steps?  yes do you drive?  yes Do you have any goals in this area?  yes  employed # of hrs/week 20 hours per week I need assistance with the following:  meal prep, household duties, and shopping Do you  have any goals in this area?  yes  bladder control problems bowel control problems weakness numbness tingling trouble walking spasms dizziness confusion depression anxiety  Any changes since last visit?  yes CT/MRI at [facility]  Any changes since last visit?  no    Family History  Problem Relation Age of Onset   Heart disease Mother 89       CAD/stenting   Hyperlipidemia Mother    Arthritis Mother        ostearthritis   Hypertension Mother    Kidney disease Mother    Irritable bowel syndrome Mother    Stroke Mother 55       CVA   Fibromyalgia Mother    Eczema Mother    Sjogren's syndrome Mother    Hyperlipidemia Father    Neuropathy Father    Allergies Father        shellfish   Prostatitis Father    Hyperlipidemia Sister    ADD / ADHD Sister    Bipolar disorder Sister    Depression Sister    Hypertension Brother    OCD Brother    Anxiety disorder Daughter    ADD / ADHD Daughter    Asthma Daughter    OCD Son    Colon cancer Neg Hx    Inflammatory bowel disease Neg Hx    Allergic rhinitis Neg Hx    Angioedema Neg Hx    Atopy Neg Hx    Immunodeficiency Neg Hx    Urticaria Neg Hx  Social History   Socioeconomic History   Marital status: Married    Spouse name: Not on file   Number of children: 2   Years of education: 16   Highest education level: Not on file  Occupational History   Not on file  Tobacco Use   Smoking status: Former    Packs/day: 0.75    Years: 10.00    Total pack years: 7.50    Types: Cigarettes    Quit date: 07/05/2001    Years since quitting: 21.3    Passive exposure: Never   Smokeless tobacco: Never  Vaping Use   Vaping Use: Never used  Substance and Sexual Activity   Alcohol use: Yes    Comment: occ   Drug use: Never   Sexual activity: Yes    Birth control/protection: Condom  Other Topics Concern   Not on file  Social History Narrative   Marital status: married x 17 years; happily married       Children: 2 children (41, 19)      Lives: with husband, 2 children.      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 4 (including me)       Pets: 1 dog, 2 cats       Highest level or education completed: Undergraduate degree       Current/Past profession: Actuary       Exercise:        Yes          Type and how often: 30-60 min walking 5 days a week. Pilate's 1 x a week, and horseback riding 2 times a week          Living Will: No   DNR: no, would like to discuss one    POA/HPOA: No      Functional Status:   Do you have difficulty bathing or dressing yourself? Left blank    Do you have difficulty preparing food or eating? Left blank   Do you have difficulty managing your medications? Left blank   Do you have difficulty managing your finances? Left blank   Do you have difficulty affording your medications? Left blank   Social Determinants of Health   Financial Resource Strain: Not on file  Food Insecurity: No Food Insecurity (04/20/2020)   Hunger Vital Sign    Worried About Running Out of Food in the Last Year: Never true    Ran Out of Food in the Last Year: Never true  Transportation Needs: No Transportation Needs (04/20/2020)   PRAPARE - Hydrologist (Medical): No    Lack of Transportation (Non-Medical): No  Physical Activity: Not on file  Stress: Not on file  Social Connections: Not on file   Past Surgical History:  Procedure Laterality Date   CARPAL  TUNNEL RELEASE Right    08/2019   CESAREAN SECTION  2003   CESAREAN SECTION  2005   Per [facility] New Patient Packet   COLONOSCOPY  2017   Dr.Jacobs   cyst drained Left 2023   knee   DENTAL SURGERY  2009   Fromed tooth and chronic infection   DILITATION & CURRETTAGE/HYSTROSCOPY WITH NOVASURE ABLATION N/A 06/03/2020   Procedure: DILATATION & CURETTAGE/HYSTEROSCOPY WITH NOVASURE ABLATION;  Surgeon: Aletha Halim, MD;  Location: [facility];  Service: Gynecology;  Laterality: N/A;   ENDOMETRIAL BIOPSY  04/20/2020       EYE SURGERY Bilateral    cryoretinopexy   Past Medical History:  Diagnosis Date   ADD (attention deficit disorder)    Allergy    Anemia    Anorexia nervosa with bulimia    Per [place] New Patient Packet   Anxiety    Asthma    Carpal tunnel syndrome    Per [facility] New Patient Packet   Depression    Dry eyes    Per [place] New Patient Packet   Dry mouth    Per [place] New Patient Packet   Fibroids    Per [facility] New Patient Packet   History of hip x-ray 2021   Per [facility] New Patient Packet   History of Papanicolaou smear of cervix 2021   Per [place] New Patient Packet, Dr.Pickens   Hyperlipidemia    Ileitis    Lupus (HCC)    PONV (postoperative nausea and vomiting)    mild nausea    Raynaud disease    Retinal tear    Per [facility] New Patient Packet   Systemic lupus erythematosus ([place])    Wt 160 lb (72.6 kg)   BMI 25.82 kg/m   Opioid Risk Score:   Fall Risk Score:  `1  Depression screen PHQ 2/9     06/25/2020    2:07 PM 04/20/2020    9:18 AM 02/20/2020   11:46 AM 02/02/2018   12:00 PM 10/30/2017    8:20 AM 05/24/2017    9:46 AM 04/19/2017   10:20 AM  Depression screen PHQ 2/9  Decreased Interest 0 1 0 0 2 0 0  Down, Depressed, Hopeless 1 0 0 0 2 0 0  PHQ - 2 Score 1 1 0 0 4 0 0  Altered sleeping 1 1   2    $ Tired, decreased energy 1 1   3    $ Change in appetite 0 0   3    Feeling bad or failure about yourself  0 1   3    Trouble concentrating 1 0   3    Moving  slowly or fidgety/restless 0 0   3    Suicidal thoughts 0 0   1    PHQ-9 Score 4 4   22    $ Difficult doing work/chores     Somewhat difficult      Review of Systems  Musculoskeletal:  Positive for arthralgias, back pain, gait problem and neck pain.       Pain all over the body, feet, hips, feet, hands, right shoulder  Neurological:  Positive for dizziness, weakness and numbness.       Tingling  Psychiatric/Behavioral:  Positive for confusion.   All other systems reviewed and are negative.      Objective:   Physical Exam General no acute distress Mood and affect are appropriate Motor strength is 5/5 bilateral deltoid,  bicep, tricep, grip, hip flexor, knee extensor, ankle dorsiflexor and plantar flexor The patient has normal range of motion bilateral shoulders elbows wrists and fingers. No evidence of synovitis at the wrists mild synovitis at the PIPs digits 2 through 5 bilaterally. No evidence of Heberden's nodules No tenderness to palpation over the fingers and wrist. Hands are mildly hyperemic. Lumbar spine range of motion is normal with flexion extension lateral bending and rotation Hip range of motion is normal with internal/external rotation and lection Knee range of motion is normal with flexion extension, no evidence of synovitis, no fullness in the popliteal fossa.  No tenderness palpation along the joint line No hypermobility of the patella. Right ankle has diminished range of motion and gets to neutral on dorsiflexion left ankle has normal ankle dorsiflexion. There is no evidence of medial or lateral instability at the ankle joint. Sensation normal bilateral upper and lower limbs      Assessment & Plan:   1.  History of several consecutive musculoskeletal issues including right shoulder tendinosis, left hamstring tendinopathy, left Baker's cyst, leading to reduced exercise tolerance.  We discussed specific exercises such as stationary bicycling to improve cardiovascular  endurance and lower extremity strength without aggravating left knee swelling. We discussed integrative therapies to look at her kinetic chain and recommend stabilizing exercises for the shoulder girdle as well as lumbopelvic pelvic region. Do not think she needs any pharmacologic intervention for pain at this time. No need for interventional procedures at this time. Return to clinic in 6 weeks 2.  Patient gives a history of some discomfort some difficulty with urination.  She would like to explore pelvic floor physical therapy.  Something that can be done after other issues addressed.  Will make appointment with Dr. Ranell Patrick to evaluate.

## 2022-10-26 ENCOUNTER — Ambulatory Visit: Payer: BC Managed Care – PPO | Admitting: Physical Therapy

## 2022-10-26 ENCOUNTER — Encounter: Payer: Self-pay | Admitting: Physical Therapy

## 2022-10-26 DIAGNOSIS — M25511 Pain in right shoulder: Secondary | ICD-10-CM | POA: Diagnosis not present

## 2022-10-26 DIAGNOSIS — R262 Difficulty in walking, not elsewhere classified: Secondary | ICD-10-CM | POA: Diagnosis not present

## 2022-10-26 DIAGNOSIS — M6281 Muscle weakness (generalized): Secondary | ICD-10-CM | POA: Diagnosis not present

## 2022-10-26 DIAGNOSIS — M79605 Pain in left leg: Secondary | ICD-10-CM

## 2022-10-26 DIAGNOSIS — F331 Major depressive disorder, recurrent, moderate: Secondary | ICD-10-CM | POA: Diagnosis not present

## 2022-10-26 DIAGNOSIS — G8929 Other chronic pain: Secondary | ICD-10-CM

## 2022-10-26 NOTE — Therapy (Signed)
OUTPATIENT PHYSICAL THERAPY TREATMENT NOTE   Patient Name: Robin [name] MRN: [id number] DOB:11/29/1970, 52 y.o., female Today's Date: 10/26/2022    END OF SESSION:   PT End of Session - 10/26/22 0840     Visit Number 10    Number of Visits 23    Date for PT Re-Evaluation 12/07/22    Authorization Type [employer] $40 copay    PT Start Time 0811    PT Stop Time 0849    PT Time Calculation (min) 38 min    Activity Tolerance Patient tolerated treatment well    Behavior During Therapy WFL for tasks assessed/performed                  Past Medical History:  Diagnosis Date   ADD (attention deficit disorder)    Allergy    Anemia    Anorexia nervosa with bulimia    Per [facility]   Anxiety    Asthma    Carpal tunnel syndrome    Per [facility]   Depression    Dry eyes    Per [facility]   Dry mouth    Per [facility]   Fibroids    Per [facility] Patient Packet   History of hip x-ray 2021   Per [facility]   History of Papanicolaou smear of cervix 2021   Per [facility] Packet, Dr.Pickens   Hyperlipidemia    Ileitis    Lupus ([place])    PONV (postoperative nausea and vomiting)    mild nausea    Raynaud disease    Retinal tear    Per [facility] New Patient Packet   Systemic lupus erythematosus ([place])    Past Surgical History:  Procedure Laterality Date   CARPAL TUNNEL RELEASE Right    08/2019   CESAREAN SECTION  2003   CESAREAN SECTION  2005   Per [facility] New Patient Packet   COLONOSCOPY  2017   Dr.Jacobs   cyst drained Left 2023   knee   DENTAL SURGERY  2009   Fromed tooth and chronic infection   DILITATION & CURRETTAGE/HYSTROSCOPY WITH NOVASURE ABLATION N/A 06/03/2020   Procedure: DILATATION & CURETTAGE/HYSTEROSCOPY WITH NOVASURE ABLATION;  Surgeon: Aletha Halim, MD;  Location: [place];  Service: Gynecology;  Laterality: N/A;   ENDOMETRIAL BIOPSY  04/20/2020       EYE SURGERY  Bilateral    cryoretinopexy   Patient Active Problem List   Diagnosis Date Noted   Left hamstring injury 07/15/2022   Labral tear of shoulder, degenerative, right 06/21/2022   Left carpal tunnel syndrome 05/26/2022   Foot sprain, left, initial encounter 05/19/2022   Baker's cyst of knee, left 04/21/2022   Right carpal tunnel syndrome 03/16/2022   Flushing 04/14/2021   Hypermobility syndrome 11/27/2020   Seasonal and perennial allergic rhinitis 11/06/2018   Mild intermittent asthma without complication [id number]   Flexural atopic dermatitis 11/06/2018   History of hyperlipidemia 11/08/2016   History of anxiety 11/08/2016   History of retinal tear 11/08/2016   History of Bilateral plantar fasciitis 08/10/2016   High risk medication use 07/04/2016   Lupus ([place]) 05/23/2016   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[place]) 03/18/2016   ADHD (attention deficit hyperactivity disorder) 12/22/2014   Raynaud's phenomenon 10/17/2013   Hyperlipidemia 09/28/2011     THERAPY DIAG:  Chronic right shoulder pain  Pain in left leg  Muscle weakness (generalized)  Difficulty in walking, not elsewhere  classified  PCP: Ngetich, Nelda Bucks NP   REFERRING PROVIDER: Leandrew Koyanagi, MD   REFERRING DIAG: [phone] (ICD-10-CM) - Left hamstring injury, initial encounter    Rationale for Evaluation and Treatment: Rehabilitation   ONSET DATE: Around 07/13/2022 for hamstring, several years for other complaints of Rt shoulder pain and back pain   SUBJECTIVE:    SUBJECTIVE STATEMENT: She relays some pain in her left hamstring and inner thigh around her knee after some light yard work.   PERTINENT HISTORY: Lt hamstring tear grade 1. Bakers Cyst per MD note.  Med history : lupus  NEW MRI Rt shoulder IMPRESSION: 1. Moderate tendinosis of the supraspinatus tendon with a partial-thickness bursal surface tear and a partial-thickness articular surface tear. 2. Mild tendinosis of the infraspinatus tendon. 3. Mild  subacromial/subdeltoid bursitis.   PAIN:  Pain location: at worst 9/10 pain in right shoulder posteriorly at current today 7/10 Description:  tenderness, tightness, spasms Aggravating factors: Rt shoulder: preventative reaching, wiping, throwing, pressure. Relieving factors: taping, stretching, previous treatment, injection helped   PRECAUTIONS: None   WEIGHT BEARING RESTRICTIONS: No   FALLS:  Has patient fallen in last 6 months? Yes 2x - Outside of house Pt indicated a fear of falling.     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  Heal hamstring completely     OBJECTIVE:      PATIENT SURVEYS:  08/16/2022: FOTO update 49  07/26/2022 FOTO intake:31 predicted:  97   COGNITION: 07/26/2022 Overall cognitive status: WFL                        SENSATION: 07/26/2022 WFL   MUSCLE LENGTH: 07/26/2022 Hamstrings passive SLR supine: Right 90 deg; Left 70 deg c pain   POSTURE:  07/26/2022 unremarkable in standing   PALPATION: 07/26/2022 Tenderness noted to light touch in Lt lumbar paraspinals   ROM:    ROM 07/26/2022 Right 07/26/2022 Left 07/26/2022 08/09/2022 Right 08/23/22  Shoulder flexion        WNL  Shoulder abduction        WNL  Shoulder internal rotation        WNL  Shoulder external rotation        WNL         Knee flexion          Knee extension                     Lumbar extension 75% WFL c end range back complaints   Repeated x 5 in standing c ERP worsened     75% WFL c tightness lower back (more on Lt)   Lumbar flexion To floor no complaints     Hands flat to floor               (Blank rows = not tested)   UPPER EXTREMITY ROM:  ROM Right eval Left eval   Shoulder flexion  WFL with painful arc in sitting and supine  Shoulder extension    Shoulder abduction    Shoulder adduction    Shoulder extension    Shoulder internal rotation  60 AROM in 45 deg abduction  Shoulder external rotation  80 AROM in  45 deg abduction   Elbow flexion    Elbow extension    Wrist flexion    Wrist extension    Wrist ulnar deviation    Wrist radial deviation    Wrist pronation    Wrist supination     (Blank rows = not tested)    LOWER EXTREMITY MMT:   MMT Right 07/26/2022 Left 07/26/2022 Left 07/28/22 Left 08/09/2022 Left 1/31/204  Hip flexion 5/5 5/5     Hip extension Check next visit Check next visit 4/5    Hip abduction Check next visit Check next visit 4/5    Hip adduction         Hip internal rotation         Hip external rotation         Knee flexion 5/5   39, 36.4 lbs   3+/5 14 lbs With  pain   4/5 20.6, 18.4 lbs 4/5 26, 23 lbs  Knee extension 5/5 5/5     Ankle dorsiflexion 5/5 5/5     Ankle plantarflexion         Ankle inversion         Ankle eversion          (Blank rows = not tested)  UPPER EXTREMITY MMT:  MMT Right 08/23/22 Right 10/12/2021  Shoulder flexion 5 5/5 c pain  Shoulder extension    Shoulder abduction 4 3+/5 c pain  Shoulder adduction    Shoulder extension    Shoulder internal rotation 4 5/5  Shoulder external rotation 4- 4/5 c pain  Middle trapezius    Lower trapezius    Elbow flexion    Elbow extension    Wrist flexion    Wrist extension    Wrist ulnar deviation    Wrist radial deviation    Wrist pronation    Wrist supination    Grip strength     (Blank rows = not tested)    SPECIAL TESTS:  07/26/2022 Lt slump test positive for complaints in thigh and calf, Negative on Rt Negative crossed slr bilateral for any radicular pain   FUNCTIONAL TESTS:  08/16/2022:  Lt SLS:  18 seconds  07/26/2022 18 inch chair transfer:  1st try without hands Lt SLS:  10 seconds, stopped due to back related  complaints Rt SLS:  30 seconds c mild aberrant movement   GAIT: 07/26/2022 Independent ambulation      TODAY'S TREATMENT  10/26/2022: Manual: Compression applied and skilled palpation with DN  Trigger Point Dry-Needling  Treatment instructions: Expect mild to moderate muscle soreness. S/S of pneumothorax if dry needled over a lung field, and to seek immediate medical attention should they occur. Patient verbalized understanding of these instructions and education.  Patient Consent Given: Yes Education handout provided: Previously provided Muscles treated: Rt infraspinatus, supraspinatus, Lt hamstrings, hip adductors Treatment response/outcome: twitch response c concordant symptoms.   Therex:  Anatomy and function review of RTC  Supine Lt hamstring stretch with strap 30 sec X 2 Supine Lt adductor stretch with strap 30 sec X 2 Prone Y,T,I X 10 each on Rt, holding 3 sec  10/21/2022: Manual: Compression applied and skilled palpation with DN  Trigger Point Dry-Needling  Treatment instructions: Expect mild to moderate muscle soreness. S/S of pneumothorax if dry needled over a lung field, and to seek immediate medical attention should they occur. Patient verbalized understanding of these instructions and education.  Patient Consent Given: Yes Education handout provided: Previously provided Muscles treated: Rt infraspinatus, supraspinatus,  upper trap, deltoids, biceps, Lt lumbar P.S/mulitifidi, Lt QL Treatment response/outcome: twitch response c concordant symptoms.   Therex:  Shoulder isometrics 5 sec X 10 each bilat flexion, IR, ER, abd,  Rows and extensions with red X 20 each bilat Cross arm stretch bilat 15 sec x 3  10/12/2022: Manual: Compression to Rt infraspinatus  Trigger Point Dry-Needling  Treatment instructions: Expect mild to moderate muscle soreness. S/S of pneumothorax if dry needled over a lung field, and to seek immediate medical attention should they occur.  Patient verbalized understanding of these instructions and education.  Patient Consent Given: Yes Education handout provided: Previously provided Muscles treated: Rt infraspinatus Treatment response/outcome: twitch response c concordant symptoms.   Therex:  Rt arm ER AROM 2 x 15 in Lt sidelying UBE fwd/back 3 mins each way lvl 3.0 Cross arm stretch Rt arm 15 sec x 3  Review of existing HEP c verbal review and handout provided.  Additional time spent in review and returning to activity post illness.    08/23/2022 Therex: Rt shoulder isometrics 5 sec X 10 each for abduction, ER, IR Lt LE nerve glide/floss X 20  Lt hamstring stretching 5 sec X 10 Supine bridges with feet on red ball 5 sec X 10 Supine bridge into hamstring curl with feet on red ball 2X5  Manual therapy for skilled palpation and Trigger Point Dry-Needling  Treatment instructions: Expect mild to moderate muscle soreness. Patient Consent Given: Yes Education handout provided: previously Muscles treated: Lt hamstrings, Rt supraspinatus, Rt infraspinatus, Rt subscapularis, Rt deltoid/RTC insertion Treatment response/outcome: good overall tolerance,twitch response noted     PATIENT EDUCATION:   10/12/2022 Education details: HEP update Person educated: Patient Education method: Consulting civil engineer, Demonstration, Verbal cues, and Handouts Education comprehension: verbalized understanding, returned demonstration, and verbal cues required   HOME EXERCISE PROGRAM: Access Code: [email or web address] URL: [email or web address] Date: 10/26/2022 Prepared by: Elsie Ra  Exercises - Supine Bridge  - 1-2 x daily - 7 x weekly - 1-2 sets - 10 reps - 2 hold - Supine 90/90 Sciatic Nerve Glide with Knee Flexion/Extension  - 1-2 x daily - 7 x weekly - 1-2 sets - 10 reps - Sidelying Hip Abduction  - 1-2 x daily - 7 x weekly - 2-3 sets - 10-15 reps - Standing Hip Hiking  - 1-2 x daily - 7 x weekly - 1 sets - 10 reps - 5 hold - Seated  Hamstring Set (Mirrored)  - 2-3 x daily - 7 x weekly - 1 sets - 10-15 reps - 5-10 hold - Prone Alternating Arm and Leg Lifts  - 1-2 x daily - 7 x weekly - 1-2 sets - 10 reps - 3 hold - Sidelying Lumbar Rotation Stretch (Mirrored)  - 2-3 x daily - 7 x weekly - 1 sets - 5 reps - 15 hold - Standing Isometric Shoulder Internal Rotation at Doorway  - 2 x daily - 6 x weekly - 1-2 sets - 10 reps - 5 hold - Standing Isometric Shoulder External Rotation with Doorway (Mirrored)  - 2 x daily - 6 x weekly - 1-2 sets - 10 reps - 5 hold - Standing Isometric Shoulder Flexion with Doorway - Arm Bent  - 2 x daily - 6 x weekly - 1-2 sets - 10 reps - 5 hold - Standing Isometric Shoulder Abduction with Doorway - Arm Bent (Mirrored)  - 2 x daily - 6 x weekly - 1-2 sets - 10 reps - 5 hold - Sidelying Shoulder External Rotation (Mirrored)  -  1-2 x daily - 7 x weekly - 2-3 sets - 10-15 reps - Standing Shoulder Posterior Capsule Stretch (Mirrored)  - 2-3 x daily - 7 x weekly - 1 sets - 5 reps - 15-30 hold - Prone Single Arm Shoulder Y  - 2 x daily - 6 x weekly - 2-3 sets - 10 reps - 3 sec hold - Prone Shoulder Horizontal Abduction  - 2 x daily - 3-4 x weekly - 2-3 sets - 10 reps - 3 sec hold - Prone Shoulder Extension - Single Arm  - 2 x daily - 3-4 x weekly - 2-3 sets - 10 reps - 3 sec hold   ASSESSMENT:   CLINICAL IMPRESSION: She had questions about her RTC injury so we and explained the anatomy, function of these muscles, and rationale behind her strength program. I added in prone scapular strengthening exercises today and added these to her HEP. She continued to show good benefit from DN.  She will continue to benefit from skilled PT.  OBJECTIVE IMPAIRMENTS: decreased activity tolerance, decreased balance, decreased coordination, decreased endurance, decreased mobility, difficulty walking, decreased ROM, decreased strength, increased fascial restrictions, impaired perceived functional ability, increased muscle spasms,  impaired flexibility, improper body mechanics, and pain.    ACTIVITY LIMITATIONS: carrying, lifting, bending, standing, squatting, stairs, transfers, and locomotion level   PARTICIPATION LIMITATIONS: cleaning, interpersonal relationship, community activity, occupation, yard work, and exercise routine, walking dog   PERSONAL FACTORS: Time since onset of injury/illness/exacerbation and multiple treatment areas, lupus  are also affecting patient's functional outcome.    REHAB POTENTIAL: Good   CLINICAL DECISION MAKING: Stable/uncomplicated   EVALUATION COMPLEXITY: Low     GOALS: Goals reviewed with patient? Yes   SHORT TERM GOALS: (target date for Short term goals are 3 weeks 08/16/2022)    1.  Patient will demonstrate independent use of home exercise program to maintain progress from in clinic treatments.   Goal status: Met 08/16/2022   LONG TERM GOALS: (target dates for all long term goals are 10 weeks  12/07/2022 )   1. Patient will demonstrate/report pain at worst less than or equal to 2/10 to facilitate minimal limitation in daily activity secondary to pain symptoms.   Goal status: revised 10/12/2022   2. Patient will demonstrate independent use of home exercise program to facilitate ability to maintain/progress functional gains from skilled physical therapy services.   Goal status: revised 10/12/2022   3. Patient will demonstrate FOTO outcome > or = 58 % to indicate reduced disability due to condition.   Goal status: revised 10/12/2022   4.  Patient will demonstrate Lt LE MMT 5/5 , dynamometry within 15 % of Rt, Rt shoulder MMT 5/5 throughout to faciltiate usual transfers, stairs, squatting at [facility] for daily life.    Goal status: revised 10/12/2022   5.  Patient will demonstrate Lt passive hamstring SLR to 90 deg equal to Rt s symptoms for normal mobility.  Goal status: revised 10/12/2022   6.  Patient will demonstrate bilateral SLS > 30 seconds to facilitate stability in  ambulation on even and uneven ground.  Goal status: revised 10/12/2022   7.  Patient will demonstrate lumbar AROM WFL s symptoms to facilitate usual mobility at PLOF in standing/walking.  Goal Status: revised 10/12/2022    PLAN:   PT FREQUENCY: 1-2x/week   PT DURATION: 8 weeks   PLANNED INTERVENTIONS: Therapeutic exercises, Therapeutic activity, Neuro Muscular re-education, Balance training, Gait training, Patient/Family education, Joint mobilization, Stair training, DME instructions, Dry  Needling, Electrical stimulation, Traction, Cryotherapy, vasopneumatic deviceMoist heat, Taping, Ultrasound, Ionotophoresis 61m/ml Dexamethasone, and Manual therapy.  All included unless contraindicated   PLAN FOR NEXT SESSION: Dry needling follow up as desired. Progressive Rt shoulder strengthening/stabilizations.   BElsie Ra PT, DPT 10/26/22 8:57 AM

## 2022-10-28 ENCOUNTER — Ambulatory Visit: Payer: BC Managed Care – PPO | Admitting: Physical Therapy

## 2022-10-28 ENCOUNTER — Encounter: Payer: Self-pay | Admitting: Physical Therapy

## 2022-10-28 DIAGNOSIS — R262 Difficulty in walking, not elsewhere classified: Secondary | ICD-10-CM

## 2022-10-28 DIAGNOSIS — G8929 Other chronic pain: Secondary | ICD-10-CM

## 2022-10-28 DIAGNOSIS — M79605 Pain in left leg: Secondary | ICD-10-CM

## 2022-10-28 DIAGNOSIS — M6281 Muscle weakness (generalized): Secondary | ICD-10-CM

## 2022-10-28 DIAGNOSIS — M25511 Pain in right shoulder: Secondary | ICD-10-CM | POA: Diagnosis not present

## 2022-10-28 NOTE — Therapy (Signed)
OUTPATIENT PHYSICAL THERAPY TREATMENT NOTE   Patient Name: Robin [name] MRN: [id number] DOB:[birth year], 52 y.o., female Today's Date: 10/28/2022    END OF SESSION:   PT End of Session - 10/28/22 0851     Visit Number 11    Number of Visits 23    Date for PT Re-Evaluation 12/07/22    Authorization Type [employer] $40 copay    PT Start Time 0803    PT Stop Time 0845    PT Time Calculation (min) 42 min    Activity Tolerance Patient tolerated treatment well    Behavior During Therapy WFL for tasks assessed/performed                   Past Medical History:  Diagnosis Date   ADD (attention deficit disorder)    Allergy    Anemia    Anorexia nervosa with bulimia    Per [place] New Patient Packet   Anxiety    Asthma    Carpal tunnel syndrome    Per [facility] New Patient Packet   Depression    Dry eyes    Per [place] New Patient Packet   Dry mouth    Per [place] New Patient Packet   Fibroids    Per [facility] New Patient Packet   History of hip x-ray 2021   Per [place] New Patient Packet   History of Papanicolaou smear of cervix 2021   Per [place] New Patient Packet, Dr.Pickens   Hyperlipidemia    Ileitis    Lupus ([place])    PONV (postoperative nausea and vomiting)    mild nausea    Raynaud disease    Retinal tear    Per [facility] New Patient Packet   Systemic lupus erythematosus ([place])    Past Surgical History:  Procedure Laterality Date   CARPAL TUNNEL RELEASE Right    08/2019   CESAREAN SECTION  2003   CESAREAN SECTION  2005   Per [facility] New Patient Packet   COLONOSCOPY  2017   Dr.Jacobs   cyst drained Left 2023   knee   DENTAL SURGERY  2009   Fromed tooth and chronic infection   DILITATION & CURRETTAGE/HYSTROSCOPY WITH NOVASURE ABLATION N/A 06/03/2020   Procedure: DILATATION & CURETTAGE/HYSTEROSCOPY WITH NOVASURE ABLATION;  Surgeon: Aletha Halim, MD;  Location: [facility];  Service: Gynecology;  Laterality: N/A;   ENDOMETRIAL BIOPSY  04/20/2020       EYE SURGERY  Bilateral    cryoretinopexy   Patient Active Problem List   Diagnosis Date Noted   Left hamstring injury 07/15/2022   Labral tear of shoulder, degenerative, right 06/21/2022   Left carpal tunnel syndrome 05/26/2022   Foot sprain, left, initial encounter 05/19/2022   Baker's cyst of knee, left 04/21/2022   Right carpal tunnel syndrome 03/16/2022   Flushing 04/14/2021   Hypermobility syndrome 11/27/2020   Seasonal and perennial allergic rhinitis 11/06/2018   Mild intermittent asthma without complication [id number]   Flexural atopic dermatitis 11/06/2018   History of hyperlipidemia 11/08/2016   History of anxiety 11/08/2016   History of retinal tear 11/08/2016   History of Bilateral plantar fasciitis 08/10/2016   High risk medication use 07/04/2016   Lupus ([place]) 05/23/2016   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[place]) 03/18/2016   ADHD (attention deficit hyperactivity disorder) 12/22/2014   Raynaud's phenomenon 10/17/2013   Hyperlipidemia 09/28/2011     THERAPY DIAG:  Chronic right shoulder pain  Pain in left leg  Muscle weakness (generalized)  Difficulty in walking, not  elsewhere classified  PCP: Ngetich, Nelda Bucks NP   REFERRING PROVIDER: Leandrew Koyanagi, MD   REFERRING DIAG: [phone] (ICD-10-CM) - Left hamstring injury, initial encounter    Rationale for Evaluation and Treatment: Rehabilitation   ONSET DATE: Around 07/13/2022 for hamstring, several years for other complaints of Rt shoulder pain and back pain   SUBJECTIVE:    SUBJECTIVE STATEMENT: She relays the pain in her shoulder and hamstring feel a lot better after the DN   PERTINENT HISTORY: Lt hamstring tear grade 1. Bakers Cyst per MD note.  Med history : lupus  NEW MRI Rt shoulder IMPRESSION: 1. Moderate tendinosis of the supraspinatus tendon with a partial-thickness bursal surface tear and a partial-thickness articular surface tear. 2. Mild tendinosis of the infraspinatus tendon. 3. Mild subacromial/subdeltoid  bursitis.   PAIN:  Pain location: 2/10 today overall in Rt shoulder and left leg Description:  tenderness, tightness, spasms Aggravating factors: Rt shoulder: preventative reaching, wiping, throwing, pressure. Relieving factors: taping, stretching, previous treatment, injection helped   PRECAUTIONS: None   WEIGHT BEARING RESTRICTIONS: No   FALLS:  Has patient fallen in last 6 months? Yes 2x - Outside of house Pt indicated a fear of falling.     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  Heal hamstring completely     OBJECTIVE:      PATIENT SURVEYS:  08/16/2022: FOTO update 49  07/26/2022 FOTO intake:31 predicted:  21   COGNITION: 07/26/2022 Overall cognitive status: WFL                        SENSATION: 07/26/2022 WFL   MUSCLE LENGTH: 07/26/2022 Hamstrings passive SLR supine: Right 90 deg; Left 70 deg c pain   POSTURE:  07/26/2022 unremarkable in standing   PALPATION: 07/26/2022 Tenderness noted to light touch in Lt lumbar paraspinals   ROM:    ROM 07/26/2022 Right 07/26/2022 Left 07/26/2022 08/09/2022 Right 08/23/22  Shoulder flexion        WNL  Shoulder abduction        WNL  Shoulder internal rotation        WNL  Shoulder external rotation        WNL         Knee flexion          Knee extension                     Lumbar extension 75% WFL c end range back complaints   Repeated x 5 in standing c ERP worsened     75% WFL c tightness lower back (more on Lt)   Lumbar flexion To floor no complaints     Hands flat to floor               (Blank rows = not tested)   UPPER EXTREMITY ROM:  ROM Right eval Left eval  Shoulder flexion  WFL with painful arc in sitting and  supine  Shoulder extension    Shoulder abduction    Shoulder adduction    Shoulder extension    Shoulder internal rotation  60 AROM in 45 deg abduction  Shoulder external rotation  80 AROM in 45 deg abduction  Elbow flexion    Elbow extension    Wrist flexion    Wrist extension    Wrist ulnar deviation    Wrist radial deviation    Wrist pronation    Wrist supination     (Blank rows = not tested)    LOWER EXTREMITY MMT:   MMT Right 07/26/2022 Left 07/26/2022 Left 07/28/22 Left 08/09/2022 Left 1/31/204  Hip flexion 5/5 5/5     Hip extension Check next visit Check next visit 4/5    Hip abduction Check next visit Check next visit 4/5    Hip adduction         Hip internal rotation         Hip external rotation         Knee flexion 5/5   39, 36.4 lbs   3+/5 14 lbs With  pain   4/5 20.6, 18.4 lbs 4/5 26, 23 lbs  Knee extension 5/5 5/5     Ankle dorsiflexion 5/5 5/5     Ankle plantarflexion         Ankle inversion         Ankle eversion          (Blank rows = not tested)  UPPER EXTREMITY MMT:  MMT Right 08/23/22 Right 10/12/2021  Shoulder flexion 5 5/5 c pain  Shoulder extension    Shoulder abduction 4 3+/5 c pain  Shoulder adduction    Shoulder extension    Shoulder internal rotation 4 5/5  Shoulder external rotation 4- 4/5 c pain  Middle trapezius    Lower trapezius    Elbow flexion    Elbow extension    Wrist flexion    Wrist extension    Wrist ulnar deviation    Wrist radial deviation    Wrist pronation    Wrist supination    Grip strength     (Blank rows = not tested)    SPECIAL TESTS:  07/26/2022 Lt slump test positive for complaints in thigh and calf, Negative on Rt Negative crossed slr bilateral for any radicular pain   FUNCTIONAL TESTS:  08/16/2022:  Lt SLS:  18 seconds  07/26/2022 18 inch chair transfer:  1st try without hands Lt SLS:  10 seconds, stopped due to back related complaints Rt SLS:  30 seconds c mild aberrant movement    GAIT: 07/26/2022 Independent ambulation      TODAY'S TREATMENT  10/28/2022: Manual: Compression applied and skilled palpation with DN  Trigger Point Dry-Needling  Treatment instructions: Expect mild to moderate muscle soreness. S/S of pneumothorax if dry needled over a lung field, and to seek immediate medical attention should they occur. Patient verbalized understanding of these instructions and education.  Patient Consent Given: Yes Education handout provided: Previously provided Muscles treated: Rt infraspinatus, supraspinatus,deltoids; Lt hamstrings, and gastroc Treatment response/outcome: twitch response c concordant symptoms.   Therex:  Prone Y,T,I X 10 each on Rt, holding 3 sec Standing rows and extensions green 2X10 Standing shoulder ER green 2X10 bilat Standing shoulder IR green 2X10 bilat Standing hip hikes  X10 bilat with UE support     PATIENT EDUCATION:   10/12/2022 Education details: HEP update Person educated: Patient Education method: Consulting civil engineer, Demonstration, Verbal cues, and Handouts Education comprehension: verbalized understanding, returned demonstration, and verbal cues required   HOME EXERCISE PROGRAM: Access Code: [email or web address] URL: [email or web address] Date: 10/26/2022 Prepared by: Elsie Ra  Exercises - Supine Bridge  - 1-2 x daily - 7 x weekly - 1-2 sets - 10 reps -  2 hold - Supine 90/90 Sciatic Nerve Glide with Knee Flexion/Extension  - 1-2 x daily - 7 x weekly - 1-2 sets - 10 reps - Sidelying Hip Abduction  - 1-2 x daily - 7 x weekly - 2-3 sets - 10-15 reps - Standing Hip Hiking  - 1-2 x daily - 7 x weekly - 1 sets - 10 reps - 5 hold - Seated Hamstring Set (Mirrored)  - 2-3 x daily - 7 x weekly - 1 sets - 10-15 reps - 5-10 hold - Prone Alternating Arm and Leg Lifts  - 1-2 x daily - 7 x weekly - 1-2 sets - 10 reps - 3 hold - Sidelying Lumbar Rotation Stretch (Mirrored)  - 2-3 x daily - 7 x weekly - 1 sets - 5 reps - 15 hold -  Standing Isometric Shoulder Internal Rotation at Doorway  - 2 x daily - 6 x weekly - 1-2 sets - 10 reps - 5 hold - Standing Isometric Shoulder External Rotation with Doorway (Mirrored)  - 2 x daily - 6 x weekly - 1-2 sets - 10 reps - 5 hold - Standing Isometric Shoulder Flexion with Doorway - Arm Bent  - 2 x daily - 6 x weekly - 1-2 sets - 10 reps - 5 hold - Standing Isometric Shoulder Abduction with Doorway - Arm Bent (Mirrored)  - 2 x daily - 6 x weekly - 1-2 sets - 10 reps - 5 hold - Sidelying Shoulder External Rotation (Mirrored)  - 1-2 x daily - 7 x weekly - 2-3 sets - 10-15 reps - Standing Shoulder Posterior Capsule Stretch (Mirrored)  - 2-3 x daily - 7 x weekly - 1 sets - 5 reps - 15-30 hold - Prone Single Arm Shoulder Y  - 2 x daily - 6 x weekly - 2-3 sets - 10 reps - 3 sec hold - Prone Shoulder Horizontal Abduction  - 2 x daily - 3-4 x weekly - 2-3 sets - 10 reps - 3 sec hold - Prone Shoulder Extension - Single Arm  - 2 x daily - 3-4 x weekly - 2-3 sets - 10 reps - 3 sec hold   ASSESSMENT:   CLINICAL IMPRESSION: She had great response from DN so this was continued today. I provided her with new band for her strengthening exercises at home and we reviewed these with her. She showed good understanding and return demo.  She will continue to benefit from skilled PT.  OBJECTIVE IMPAIRMENTS: decreased activity tolerance, decreased balance, decreased coordination, decreased endurance, decreased mobility, difficulty walking, decreased ROM, decreased strength, increased fascial restrictions, impaired perceived functional ability, increased muscle spasms, impaired flexibility, improper body mechanics, and pain.    ACTIVITY LIMITATIONS: carrying, lifting, bending, standing, squatting, stairs, transfers, and locomotion level   PARTICIPATION LIMITATIONS: cleaning, interpersonal relationship, community activity, occupation, yard work, and exercise routine, walking dog   PERSONAL FACTORS: Time since  onset of injury/illness/exacerbation and multiple treatment areas, lupus  are also affecting patient's functional outcome.    REHAB POTENTIAL: Good   CLINICAL DECISION MAKING: Stable/uncomplicated   EVALUATION COMPLEXITY: Low     GOALS: Goals reviewed with patient? Yes   SHORT TERM GOALS: (target date for Short term goals are 3 weeks 08/16/2022)    1.  Patient will demonstrate independent use of home exercise program to maintain progress from in clinic treatments.   Goal status: Met 08/16/2022   LONG TERM GOALS: (target dates for all long term goals are 10 weeks  12/07/2022 )  1. Patient will demonstrate/report pain at worst less than or equal to 2/10 to facilitate minimal limitation in daily activity secondary to pain symptoms.   Goal status: revised 10/12/2022   2. Patient will demonstrate independent use of home exercise program to facilitate ability to maintain/progress functional gains from skilled physical therapy services.   Goal status: revised 10/12/2022   3. Patient will demonstrate FOTO outcome > or = 58 % to indicate reduced disability due to condition.   Goal status: revised 10/12/2022   4.  Patient will demonstrate Lt LE MMT 5/5 , dynamometry within 15 % of Rt, Rt shoulder MMT 5/5 throughout to faciltiate usual transfers, stairs, squatting at [facility] for daily life.    Goal status: revised 10/12/2022   5.  Patient will demonstrate Lt passive hamstring SLR to 90 deg equal to Rt s symptoms for normal mobility.  Goal status: revised 10/12/2022   6.  Patient will demonstrate bilateral SLS > 30 seconds to facilitate stability in ambulation on even and uneven ground.  Goal status: revised 10/12/2022   7.  Patient will demonstrate lumbar AROM WFL s symptoms to facilitate usual mobility at PLOF in standing/walking.  Goal Status: revised 10/12/2022    PLAN:   PT FREQUENCY: 1-2x/week   PT DURATION: 8 weeks   PLANNED INTERVENTIONS: Therapeutic exercises, Therapeutic  activity, Neuro Muscular re-education, Balance training, Gait training, Patient/Family education, Joint mobilization, Stair training, DME instructions, Dry Needling, Electrical stimulation, Traction, Cryotherapy, vasopneumatic deviceMoist heat, Taping, Ultrasound, Ionotophoresis 57m/ml Dexamethasone, and Manual therapy.  All included unless contraindicated   PLAN FOR NEXT SESSION: Dry needling follow up as desired. Progressive Rt shoulder strengthening/stabilizations.   BElsie Ra PT, DPT 10/28/22 8:52 AM

## 2022-10-31 ENCOUNTER — Encounter: Payer: BC Managed Care – PPO | Admitting: Physical Therapy

## 2022-10-31 ENCOUNTER — Other Ambulatory Visit: Payer: Self-pay | Admitting: Allergy & Immunology

## 2022-10-31 ENCOUNTER — Other Ambulatory Visit: Payer: Self-pay | Admitting: Physician Assistant

## 2022-10-31 DIAGNOSIS — M3219 Other organ or system involvement in systemic lupus erythematosus: Secondary | ICD-10-CM

## 2022-11-01 ENCOUNTER — Encounter: Payer: Self-pay | Admitting: Physical Therapy

## 2022-11-02 ENCOUNTER — Encounter: Payer: Self-pay | Admitting: Rehabilitative and Restorative Service Providers"

## 2022-11-02 ENCOUNTER — Ambulatory Visit (INDEPENDENT_AMBULATORY_CARE_PROVIDER_SITE_OTHER): Payer: BC Managed Care – PPO | Admitting: Rehabilitative and Restorative Service Providers"

## 2022-11-02 DIAGNOSIS — M79605 Pain in left leg: Secondary | ICD-10-CM | POA: Diagnosis not present

## 2022-11-02 DIAGNOSIS — M6281 Muscle weakness (generalized): Secondary | ICD-10-CM | POA: Diagnosis not present

## 2022-11-02 DIAGNOSIS — G8929 Other chronic pain: Secondary | ICD-10-CM

## 2022-11-02 DIAGNOSIS — M25511 Pain in right shoulder: Secondary | ICD-10-CM

## 2022-11-02 DIAGNOSIS — R262 Difficulty in walking, not elsewhere classified: Secondary | ICD-10-CM | POA: Diagnosis not present

## 2022-11-02 NOTE — Therapy (Signed)
OUTPATIENT PHYSICAL THERAPY TREATMENT NOTE   Patient Name: Robin [name] MRN: [id number] DOB:07-16-1971, 52 y.o., female Today's Date: 11/02/2022    END OF SESSION:   PT End of Session - 11/02/22 0957     Visit Number 12    Number of Visits 23    Date for PT Re-Evaluation 12/07/22    Authorization Type [employer] $40 copay    PT Start Time 0931    PT Stop Time 1010    PT Time Calculation (min) 39 min    Activity Tolerance Patient tolerated treatment well    Behavior During Therapy WFL for tasks assessed/performed                    Past Medical History:  Diagnosis Date   ADD (attention deficit disorder)    Allergy    Anemia    Anorexia nervosa with bulimia    Per [place] New Patient Packet   Anxiety    Asthma    Carpal tunnel syndrome    Per [place] New Patient Packet   Depression    Dry eyes    Per [place] New Patient Packet   Dry mouth    Per [place] New Patient Packet   Fibroids    Per [place] New Patient Packet   History of hip x-ray 2021   Per [place] New Patient Packet   History of Papanicolaou smear of cervix 2021   Per [employer] New Patient Packet, Dr.Pickens   Hyperlipidemia    Ileitis    Lupus ([place])    PONV (postoperative nausea and vomiting)    mild nausea    Raynaud disease    Retinal tear    Per [facility] New Patient Packet   Systemic lupus erythematosus ([place])    Past Surgical History:  Procedure Laterality Date   CARPAL TUNNEL RELEASE Right    08/2019   CESAREAN SECTION  2003   CESAREAN SECTION  2005   Per [facility] New Patient Packet   COLONOSCOPY  2017   Dr.Jacobs   cyst drained Left 2023   knee   DENTAL SURGERY  2009   Fromed tooth and chronic infection   DILITATION & CURRETTAGE/HYSTROSCOPY WITH NOVASURE ABLATION N/A 06/03/2020   Procedure: DILATATION & CURETTAGE/HYSTEROSCOPY WITH NOVASURE ABLATION;  Surgeon: Aletha Halim, MD;  Location: [place];  Service: Gynecology;  Laterality: N/A;   ENDOMETRIAL BIOPSY  04/20/2020       EYE SURGERY  Bilateral    cryoretinopexy   Patient Active Problem List   Diagnosis Date Noted   Left hamstring injury 07/15/2022   Labral tear of shoulder, degenerative, right 06/21/2022   Left carpal tunnel syndrome 05/26/2022   Foot sprain, left, initial encounter 05/19/2022   Baker's cyst of knee, left 04/21/2022   Right carpal tunnel syndrome 03/16/2022   Flushing 04/14/2021   Hypermobility syndrome 11/27/2020   Seasonal and perennial allergic rhinitis 11/06/2018   Mild intermittent asthma without complication [id number]   Flexural atopic dermatitis 11/06/2018   History of hyperlipidemia 11/08/2016   History of anxiety 11/08/2016   History of retinal tear 11/08/2016   History of Bilateral plantar fasciitis 08/10/2016   High risk medication use 07/04/2016   Lupus ([place]) 05/23/2016   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[place]) 03/18/2016   ADHD (attention deficit hyperactivity disorder) 12/22/2014   Raynaud's phenomenon 10/17/2013   Hyperlipidemia 09/28/2011     THERAPY DIAG:  Chronic right shoulder pain  Pain in left leg  Muscle weakness (generalized)  Difficulty in walking,  not elsewhere classified  PCP: Ngetich, Nelda Bucks NP   REFERRING PROVIDER: Leandrew Koyanagi, MD   REFERRING DIAG: [phone] (ICD-10-CM) - Left hamstring injury, initial encounter    Rationale for Evaluation and Treatment: Rehabilitation   ONSET DATE: Around 07/13/2022 for hamstring, several years for other complaints of Rt shoulder pain and back pain   SUBJECTIVE:    SUBJECTIVE STATEMENT: She indicated she was feeling more fatigue and "laxity" in last few days vs the pain she was having.  Pain still can occur.    PERTINENT HISTORY: Lt hamstring tear grade 1. Bakers Cyst per MD note.  Med history : lupus  NEW MRI Rt shoulder IMPRESSION: 1. Moderate tendinosis of the supraspinatus tendon with a partial-thickness bursal surface tear and a partial-thickness articular surface tear. 2. Mild tendinosis of the infraspinatus  tendon. 3. Mild subacromial/subdeltoid bursitis.   PAIN:  Pain location:  3/10 at worst for shoulder, 5/10 at worst for Lt hamstring Description:  tenderness, tightness, spasms Aggravating factors: Rt shoulder: preventative reaching, wiping, throwing, pressure. Relieving factors: taping, stretching, previous treatment, injection helped   PRECAUTIONS: None   WEIGHT BEARING RESTRICTIONS: No   FALLS:  Has patient fallen in last 6 months? Yes 2x - Outside of house Pt indicated a fear of falling.     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  Heal hamstring completely     OBJECTIVE:     PATIENT SURVEYS:  11/02/2022:    08/16/2022: FOTO update 49  07/26/2022 FOTO intake:31 predicted:  27   COGNITION: 07/26/2022 Overall cognitive status: WFL                        SENSATION: 07/26/2022 WFL   MUSCLE LENGTH: 07/26/2022 Hamstrings passive SLR supine: Right 90 deg; Left 70 deg c pain   POSTURE:  07/26/2022 unremarkable in standing   PALPATION: 07/26/2022 Tenderness noted to light touch in Lt lumbar paraspinals   ROM:    ROM 07/26/2022 Right 07/26/2022 Left 07/26/2022 08/09/2022 Right 08/23/22  Shoulder flexion        WNL  Shoulder abduction        WNL  Shoulder internal rotation        WNL  Shoulder external rotation        WNL         Knee flexion          Knee extension                     Lumbar extension 75% WFL c end range back complaints   Repeated x 5 in standing c ERP worsened     75% WFL c tightness lower back (more on Lt)   Lumbar flexion To floor no complaints     Hands flat to floor               (Blank rows = not tested)   UPPER EXTREMITY ROM:  ROM Right eval  Left eval   Shoulder flexion  WFL with painful arc in sitting and supine   Shoulder extension     Shoulder abduction     Shoulder adduction     Shoulder extension     Shoulder  internal rotation  60 AROM in 45 deg abduction   Shoulder external rotation  80 AROM in 45 deg abduction    Elbow flexion     Elbow extension     Wrist flexion          Lower trap     Middle trap                (Blank rows = not tested)    LOWER EXTREMITY MMT:   MMT Right 07/26/2022 Left 07/26/2022 Left 07/28/22 Left 08/09/2022 Left 1/31/204  Hip flexion 5/5 5/5     Hip extension Check next visit Check next visit 4/5    Hip abduction Check next visit Check next visit 4/5    Hip adduction         Hip internal rotation         Hip external rotation         Knee flexion 5/5   39, 36.4 lbs   3+/5 14 lbs With  pain   4/5 20.6, 18.4 lbs 4/5 26, 23 lbs  Knee extension 5/5 5/5     Ankle dorsiflexion 5/5 5/5     Ankle plantarflexion         Ankle inversion         Ankle eversion          (Blank rows = not tested)  UPPER EXTREMITY MMT:  MMT Right 08/23/22 Right 10/12/2021 Right 11/02/2022 Left 11/02/2022  Shoulder flexion 5 5/5 c pain    Shoulder extension      Shoulder abduction 4 3+/5 c pain    Shoulder adduction      Shoulder extension      Shoulder internal rotation 4 5/5    Shoulder external rotation 4- 4/5 c pain    Middle trapezius   4/5 4/5  Lower trapezius   4/5 4/5  Elbow flexion      Elbow extension      Wrist flexion      Wrist extension      Wrist ulnar deviation      Wrist radial deviation      Wrist pronation      Wrist supination      Grip strength       (Blank rows = not tested)    SPECIAL TESTS:  07/26/2022 Lt slump test positive for complaints in thigh and calf, Negative on Rt Negative crossed slr bilateral for any radicular pain   FUNCTIONAL TESTS:  08/16/2022:  Lt SLS:  18 seconds  07/26/2022 18 inch chair transfer:  1st try without hands Lt SLS:  10  seconds, stopped due to back related complaints Rt SLS:  30 seconds c mild aberrant movement   GAIT: 07/26/2022 Independent ambulation      TODAY'S TREATMENT  11/02/2022: Therex:  Prone lying chest on pball with knees on table y's with green band between hands 2 x 10 3 sec hold, t's with 1 lb 2 x 10 3 sec hold, gh ext 1 lb 2 x 10 c 3 sec hold  Standing rows and extensions green 2X10 Standing bilateral shoulder ER c scap retraction green x 15 Standing hip hike in doorframe with contralateral hip abduction into doorframe 5 sec hold x 10 bilateral Standing RDL x 10 bilateral  10/28/2022: Manual: Compression applied and skilled palpation with DN  Trigger Point Dry-Needling  Treatment instructions: Expect mild to moderate muscle soreness. S/S of pneumothorax if dry needled over a lung field, and to  seek immediate medical attention should they occur. Patient verbalized understanding of these instructions and education.  Patient Consent Given: Yes Education handout provided: Previously provided Muscles treated: Rt infraspinatus, supraspinatus,deltoids; Lt hamstrings, and gastroc Treatment response/outcome: twitch response c concordant symptoms.   Therex:  Prone Y,T,I X 10 each on Rt, holding 3 sec Standing rows and extensions green 2X10 Standing shoulder ER green 2X10 bilat Standing shoulder IR green 2X10 bilat Standing hip hikes  X10 bilat with UE support     PATIENT EDUCATION:   10/12/2022 Education details: HEP update Person educated: Patient Education method: Consulting civil engineer, Demonstration, Verbal cues, and Handouts Education comprehension: verbalized understanding, returned demonstration, and verbal cues required   HOME EXERCISE PROGRAM: Access Code: [email or web address] URL: [email or web address] Date: 10/26/2022 Prepared by: Elsie Ra  Exercises - Supine Bridge  - 1-2 x daily - 7 x weekly - 1-2 sets - 10 reps - 2 hold - Supine 90/90 Sciatic Nerve Glide with Knee  Flexion/Extension  - 1-2 x daily - 7 x weekly - 1-2 sets - 10 reps - Sidelying Hip Abduction  - 1-2 x daily - 7 x weekly - 2-3 sets - 10-15 reps - Standing Hip Hiking  - 1-2 x daily - 7 x weekly - 1 sets - 10 reps - 5 hold - Seated Hamstring Set (Mirrored)  - 2-3 x daily - 7 x weekly - 1 sets - 10-15 reps - 5-10 hold - Prone Alternating Arm and Leg Lifts  - 1-2 x daily - 7 x weekly - 1-2 sets - 10 reps - 3 hold - Sidelying Lumbar Rotation Stretch (Mirrored)  - 2-3 x daily - 7 x weekly - 1 sets - 5 reps - 15 hold - Standing Isometric Shoulder Internal Rotation at Doorway  - 2 x daily - 6 x weekly - 1-2 sets - 10 reps - 5 hold - Standing Isometric Shoulder External Rotation with Doorway (Mirrored)  - 2 x daily - 6 x weekly - 1-2 sets - 10 reps - 5 hold - Standing Isometric Shoulder Flexion with Doorway - Arm Bent  - 2 x daily - 6 x weekly - 1-2 sets - 10 reps - 5 hold - Standing Isometric Shoulder Abduction with Doorway - Arm Bent (Mirrored)  - 2 x daily - 6 x weekly - 1-2 sets - 10 reps - 5 hold - Sidelying Shoulder External Rotation (Mirrored)  - 1-2 x daily - 7 x weekly - 2-3 sets - 10-15 reps - Standing Shoulder Posterior Capsule Stretch (Mirrored)  - 2-3 x daily - 7 x weekly - 1 sets - 5 reps - 15-30 hold - Prone Single Arm Shoulder Y  - 2 x daily - 6 x weekly - 2-3 sets - 10 reps - 3 sec hold - Prone Shoulder Horizontal Abduction  - 2 x daily - 3-4 x weekly - 2-3 sets - 10 reps - 3 sec hold - Prone Shoulder Extension - Single Arm  - 2 x daily - 3-4 x weekly - 2-3 sets - 10 reps - 3 sec hold   ASSESSMENT:   CLINICAL IMPRESSION: Due to overall symptom reduction upon arrival today, focus was provided more on progressive strengthening and endurance improvements for UE and LE.   OBJECTIVE IMPAIRMENTS: decreased activity tolerance, decreased balance, decreased coordination, decreased endurance, decreased mobility, difficulty walking, decreased ROM, decreased strength, increased fascial  restrictions, impaired perceived functional ability, increased muscle spasms, impaired flexibility, improper body mechanics, and pain.    ACTIVITY LIMITATIONS: carrying, lifting, bending,  standing, squatting, stairs, transfers, and locomotion level   PARTICIPATION LIMITATIONS: cleaning, interpersonal relationship, community activity, occupation, yard work, and exercise routine, walking dog   PERSONAL FACTORS: Time since onset of injury/illness/exacerbation and multiple treatment areas, lupus  are also affecting patient's functional outcome.    REHAB POTENTIAL: Good   CLINICAL DECISION MAKING: Stable/uncomplicated   EVALUATION COMPLEXITY: Low     GOALS: Goals reviewed with patient? Yes   SHORT TERM GOALS: (target date for Short term goals are 3 weeks 08/16/2022)    1.  Patient will demonstrate independent use of home exercise program to maintain progress from in clinic treatments.   Goal status: Met 08/16/2022   LONG TERM GOALS: (target dates for all long term goals are 10 weeks  12/07/2022 )   1. Patient will demonstrate/report pain at worst less than or equal to 2/10 to facilitate minimal limitation in daily activity secondary to pain symptoms.   Goal status: on going 11/02/2022   2. Patient will demonstrate independent use of home exercise program to facilitate ability to maintain/progress functional gains from skilled physical therapy services.   Goal status: on going 11/02/2022   3. Patient will demonstrate FOTO outcome > or = 58 % to indicate reduced disability due to condition.   Goal status:on going 11/02/2022   4.  Patient will demonstrate Lt LE MMT 5/5 , dynamometry within 15 % of Rt, Rt shoulder MMT 5/5 throughout to faciltiate usual transfers, stairs, squatting at [facility] for daily life.    Goal status: on going 11/02/2022   5.  Patient will demonstrate Lt passive hamstring SLR to 90 deg equal to Rt s symptoms for normal mobility.  Goal status: on going 11/02/2022   6.   Patient will demonstrate bilateral SLS > 30 seconds to facilitate stability in ambulation on even and uneven ground.  Goal status:on going 11/02/2022   7.  Patient will demonstrate lumbar AROM WFL s symptoms to facilitate usual mobility at PLOF in standing/walking.  Goal Status: on going 11/02/2022    PLAN:   PT FREQUENCY: 1-2x/week   PT DURATION: 8 weeks   PLANNED INTERVENTIONS: Therapeutic exercises, Therapeutic activity, Neuro Muscular re-education, Balance training, Gait training, Patient/Family education, Joint mobilization, Stair training, DME instructions, Dry Needling, Electrical stimulation, Traction, Cryotherapy, vasopneumatic deviceMoist heat, Taping, Ultrasound, Ionotophoresis 49m/ml Dexamethasone, and Manual therapy.  All included unless contraindicated   PLAN FOR NEXT SESSION: Dry needling follow up as desired. Strengthening program with stability inclusion.     MScot Jun PT, DPT, OCS, ATC 11/02/22  10:21 AM

## 2022-11-08 ENCOUNTER — Encounter: Payer: Self-pay | Admitting: Rehabilitative and Restorative Service Providers"

## 2022-11-08 ENCOUNTER — Ambulatory Visit: Payer: BC Managed Care – PPO | Admitting: Rehabilitative and Restorative Service Providers"

## 2022-11-08 ENCOUNTER — Encounter: Payer: Self-pay | Admitting: Allergy & Immunology

## 2022-11-08 DIAGNOSIS — R262 Difficulty in walking, not elsewhere classified: Secondary | ICD-10-CM

## 2022-11-08 DIAGNOSIS — M79605 Pain in left leg: Secondary | ICD-10-CM

## 2022-11-08 DIAGNOSIS — M25511 Pain in right shoulder: Secondary | ICD-10-CM

## 2022-11-08 DIAGNOSIS — M6281 Muscle weakness (generalized): Secondary | ICD-10-CM

## 2022-11-08 DIAGNOSIS — G8929 Other chronic pain: Secondary | ICD-10-CM

## 2022-11-08 NOTE — Therapy (Signed)
OUTPATIENT PHYSICAL THERAPY TREATMENT NOTE   Patient Name: ROXEY TRESCOTT MRN: [id number] DOB:1971/07/29, 52 y.o., female Today's Date: 11/08/2022    END OF SESSION:   PT End of Session - 11/08/22 0859     Visit Number 13    Number of Visits 23    Date for PT Re-Evaluation 12/07/22    Authorization Type [employer] $[id number] copay    Authorization - Number of Visits 19    PT Start Time 0845    PT Stop Time 0925    PT Time Calculation (min) 40 min    Activity Tolerance Patient tolerated treatment well    Behavior During Therapy WFL for tasks assessed/performed             Past Medical History:  Diagnosis Date   ADD (attention deficit disorder)    Allergy    Anemia    Anorexia nervosa with bulimia    Per [facility] New Patient Packet   Anxiety    Asthma    Carpal tunnel syndrome    Per [facility] New Patient Packet   Depression    Dry eyes    Per [facility] Patient Packet   Dry mouth    Per [facility] New Patient Packet   Fibroids    Per [facility] New Patient Packet   History of hip x-ray 2021   Per [facility] Patient Packet   History of Papanicolaou smear of cervix 2021   Per [facility] New Patient Packet, Dr.Pickens   Hyperlipidemia    Ileitis    Lupus ([place])    PONV (postoperative nausea and vomiting)    mild nausea    Raynaud disease    Retinal tear    Per [facility] New Patient Packet   Systemic lupus erythematosus ([place])    Past Surgical History:  Procedure Laterality Date   CARPAL TUNNEL RELEASE Right    08/2019   CESAREAN SECTION  2003   CESAREAN SECTION  2005   Per [facility] New Patient Packet   COLONOSCOPY  2017   Dr.Jacobs   cyst drained Left 2023   knee   DENTAL SURGERY  2009   Fromed tooth and chronic infection   DILITATION & CURRETTAGE/HYSTROSCOPY WITH NOVASURE ABLATION N/A 06/03/2020   Procedure: DILATATION & CURETTAGE/HYSTEROSCOPY WITH NOVASURE ABLATION;  Surgeon: Aletha Halim, MD;  Location: [facility];  Service: Gynecology;  Laterality: N/A;   ENDOMETRIAL BIOPSY   04/20/2020       EYE SURGERY Bilateral    cryoretinopexy   Patient Active Problem List   Diagnosis Date Noted   Left hamstring injury 07/15/2022   Labral tear of shoulder, degenerative, right 06/21/2022   Left carpal tunnel syndrome 05/26/2022   Foot sprain, left, initial encounter 05/19/2022   Baker's cyst of knee, left 04/21/2022   Right carpal tunnel syndrome 03/16/2022   Flushing 04/14/2021   Hypermobility syndrome 11/27/2020   Seasonal and perennial allergic rhinitis 11/06/2018   Mild intermittent asthma without complication [id number]   Flexural atopic dermatitis 11/06/2018   History of hyperlipidemia 11/08/2016   History of anxiety 11/08/2016   History of retinal tear 11/08/2016   History of Bilateral plantar fasciitis 08/10/2016   High risk medication use 07/04/2016   Lupus ([place]) 05/23/2016   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[place]) 03/18/2016   ADHD (attention deficit hyperactivity disorder) 12/22/2014   Raynaud's phenomenon 10/17/2013   Hyperlipidemia 09/28/2011     THERAPY DIAG:  Chronic right shoulder pain  Pain in left leg  Muscle weakness (generalized)  Difficulty  in walking, not elsewhere classified  PCP: Ngetich, Nelda Bucks NP   REFERRING PROVIDER: Leandrew Koyanagi, MD   REFERRING DIAG: [phone] (ICD-10-CM) - Left hamstring injury, initial encounter    Rationale for Evaluation and Treatment: Rehabilitation   ONSET DATE: Around 07/13/2022 for hamstring, several years for other complaints of Rt shoulder pain and back pain   SUBJECTIVE:    SUBJECTIVE STATEMENT: She indicated having some knots in hamstring and calf on Lt that she wanted to work on today.  She indicated having a popping in back of Rt periscapular region with some movements but not painful.    PERTINENT HISTORY: Lt hamstring tear grade 1. Bakers Cyst per MD note.  Med history : lupus  NEW MRI Rt shoulder IMPRESSION: 1. Moderate tendinosis of the supraspinatus tendon with a partial-thickness bursal  surface tear and a partial-thickness articular surface tear. 2. Mild tendinosis of the infraspinatus tendon. 3. Mild subacromial/subdeltoid bursitis.   PAIN:  Pain location:  no specific pain at rest Description:  tenderness, tightness, spasms Aggravating factors: Rt shoulder: preventative reaching, wiping, throwing, pressure. Relieving factors: taping, stretching, previous treatment, injection helped   PRECAUTIONS: None   WEIGHT BEARING RESTRICTIONS: No   FALLS:  Has patient fallen in last 6 months? Yes 2x - Outside of house Pt indicated a fear of falling.     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  Heal hamstring completely     OBJECTIVE:     PATIENT SURVEYS:  11/08/2022:  FOTO update 51  08/16/2022: FOTO update 49  07/26/2022 FOTO intake:31 predicted:  25   COGNITION: 07/26/2022 Overall cognitive status: WFL                        SENSATION: 07/26/2022 WFL   MUSCLE LENGTH: 07/26/2022 Hamstrings passive SLR supine: Right 90 deg; Left 70 deg c pain   POSTURE:  07/26/2022 unremarkable in standing   PALPATION: 07/26/2022 Tenderness noted to light touch in Lt lumbar paraspinals   ROM:    ROM 07/26/2022 Right 07/26/2022 Left 07/26/2022 08/09/2022 Right 08/23/22  Shoulder flexion        WNL  Shoulder abduction        WNL  Shoulder internal rotation        WNL  Shoulder external rotation        WNL         Knee flexion          Knee extension                     Lumbar extension 75% WFL c end range back complaints   Repeated x 5 in standing c ERP worsened     75% WFL c tightness lower back (more on Lt)   Lumbar flexion To floor no complaints     Hands flat to floor                (Blank rows = not tested)   UPPER EXTREMITY ROM:  ROM Right eval Left eval   Shoulder flexion  WFL with painful arc in sitting and supine   Shoulder extension     Shoulder abduction     Shoulder adduction  Shoulder extension     Shoulder internal rotation  60 AROM in 45 deg abduction   Shoulder external rotation  80 AROM in 45 deg abduction    Elbow flexion     Elbow extension     Wrist flexion          Lower trap     Middle trap                (Blank rows = not tested)    LOWER EXTREMITY MMT:   MMT Right 07/26/2022 Left 07/26/2022 Left 07/28/22 Left 08/09/2022 Left 1/31/204  Hip flexion 5/5 5/5     Hip extension Check next visit Check next visit 4/5    Hip abduction Check next visit Check next visit 4/5    Hip adduction         Hip internal rotation         Hip external rotation         Knee flexion 5/5   39, 36.4 lbs   3+/5 14 lbs With  pain   4/5 20.6, 18.4 lbs 4/5 26, 23 lbs  Knee extension 5/5 5/5     Ankle dorsiflexion 5/5 5/5     Ankle plantarflexion         Ankle inversion         Ankle eversion          (Blank rows = not tested)  UPPER EXTREMITY MMT:  MMT Right 08/23/22 Right 10/12/2021 Right 11/02/2022 Left 11/02/2022  Shoulder flexion 5 5/5 c pain    Shoulder extension      Shoulder abduction 4 3+/5 c pain    Shoulder adduction      Shoulder extension      Shoulder internal rotation 4 5/5    Shoulder external rotation 4- 4/5 c pain    Middle trapezius   4/5 4/5  Lower trapezius   4/5 4/5  Elbow flexion      Elbow extension      Wrist flexion      Wrist extension      Wrist ulnar deviation      Wrist radial deviation      Wrist pronation      Wrist supination      Grip strength       (Blank rows = not tested)    SPECIAL TESTS:  07/26/2022 Lt slump test positive for complaints in thigh and calf, Negative on Rt Negative crossed slr bilateral for any radicular pain   FUNCTIONAL TESTS:  08/16/2022:  Lt SLS:  18  seconds  07/26/2022 18 inch chair transfer:  1st try without hands Lt SLS:  10 seconds, stopped due to back related complaints Rt SLS:  30 seconds c mild aberrant movement   GAIT: 07/26/2022 Independent ambulation      TODAY'S TREATMENT  11/08/2022: Manual: Compression applied and skilled palpation with DN  Trigger Point Dry-Needling  Treatment instructions: Expect mild to moderate muscle soreness. S/S of pneumothorax if dry needled over a lung field, and to seek immediate medical attention should they occur. Patient verbalized understanding of these instructions and education.  Patient Consent Given: Yes Education handout provided: Previously provided Muscles treated: Lt hamstrings, and gastroc Treatment response/outcome: twitch response c concordant symptoms.   Therex UBE LE Prone swimmers with lift over cones to go from y to t to ext x 10 bilateral Push up hold on knees with SA hold with contralateral arm touching to cones with reactive color call  outs x 20 bilateral RDL x 15 bilateral with occasional hand assist Hip hike c hip abduction into doorframe review 5 sec hold x 10 bilateral Knee flexion double leg up, Lt leg eccentric return 15 lbs  x 10 , 20 lbs x 10   11/02/2022: Therex:  Prone lying chest on pball with knees on table y's with green band between hands 2 x 10 3 sec hold, t's with 1 lb 2 x 10 3 sec hold, gh ext 1 lb 2 x 10 c 3 sec hold  Standing rows and extensions green 2X10 Standing bilateral shoulder ER c scap retraction green x 15 Standing hip hike in doorframe with contralateral hip abduction into doorframe 5 sec hold x 10 bilateral Standing RDL x 10 bilateral  10/28/2022: Manual: Compression applied and skilled palpation with DN  Trigger Point Dry-Needling  Treatment instructions: Expect mild to moderate muscle soreness. S/S of pneumothorax if dry needled over a lung field, and to seek immediate medical attention should they occur. Patient verbalized  understanding of these instructions and education.  Patient Consent Given: Yes Education handout provided: Previously provided Muscles treated: Rt infraspinatus, supraspinatus,deltoids; Lt hamstrings, and gastroc Treatment response/outcome: twitch response c concordant symptoms.   Therex:  Prone Y,T,I X 10 each on Rt, holding 3 sec Standing rows and extensions green 2X10 Standing shoulder ER green 2X10 bilat Standing shoulder IR green 2X10 bilat Standing hip hikes  X10 bilat with UE support     PATIENT EDUCATION:   10/12/2022 Education details: HEP update Person educated: Patient Education method: Consulting civil engineer, Demonstration, Verbal cues, and Handouts Education comprehension: verbalized understanding, returned demonstration, and verbal cues required   HOME EXERCISE PROGRAM: Access Code: [email or web address] URL: [email or web address] Date: 10/26/2022 Prepared by: Elsie Ra  Exercises - Supine Bridge  - 1-2 x daily - 7 x weekly - 1-2 sets - 10 reps - 2 hold - Supine 90/90 Sciatic Nerve Glide with Knee Flexion/Extension  - 1-2 x daily - 7 x weekly - 1-2 sets - 10 reps - Sidelying Hip Abduction  - 1-2 x daily - 7 x weekly - 2-3 sets - 10-15 reps - Standing Hip Hiking  - 1-2 x daily - 7 x weekly - 1 sets - 10 reps - 5 hold - Seated Hamstring Set (Mirrored)  - 2-3 x daily - 7 x weekly - 1 sets - 10-15 reps - 5-10 hold - Prone Alternating Arm and Leg Lifts  - 1-2 x daily - 7 x weekly - 1-2 sets - 10 reps - 3 hold - Sidelying Lumbar Rotation Stretch (Mirrored)  - 2-3 x daily - 7 x weekly - 1 sets - 5 reps - 15 hold - Standing Isometric Shoulder Internal Rotation at Doorway  - 2 x daily - 6 x weekly - 1-2 sets - 10 reps - 5 hold - Standing Isometric Shoulder External Rotation with Doorway (Mirrored)  - 2 x daily - 6 x weekly - 1-2 sets - 10 reps - 5 hold - Standing Isometric Shoulder Flexion with Doorway - Arm Bent  - 2 x daily - 6 x weekly - 1-2 sets - 10 reps - 5 hold - Standing  Isometric Shoulder Abduction with Doorway - Arm Bent (Mirrored)  - 2 x daily - 6 x weekly - 1-2 sets - 10 reps - 5 hold - Sidelying Shoulder External Rotation (Mirrored)  - 1-2 x daily - 7 x weekly - 2-3 sets - 10-15 reps - Standing Shoulder Posterior Capsule Stretch (Mirrored)  -  2-3 x daily - 7 x weekly - 1 sets - 5 reps - 15-30 hold - Prone Single Arm Shoulder Y  - 2 x daily - 6 x weekly - 2-3 sets - 10 reps - 3 sec hold - Prone Shoulder Horizontal Abduction  - 2 x daily - 3-4 x weekly - 2-3 sets - 10 reps - 3 sec hold - Prone Shoulder Extension - Single Arm  - 2 x daily - 3-4 x weekly - 2-3 sets - 10 reps - 3 sec hold   ASSESSMENT:   CLINICAL IMPRESSION: Smaller twitches responses noted in hamstring today than in past but still noted with localized symptoms.  Shoulder control and strength improving as observed within activity tasks in clinic.   OBJECTIVE IMPAIRMENTS: decreased activity tolerance, decreased balance, decreased coordination, decreased endurance, decreased mobility, difficulty walking, decreased ROM, decreased strength, increased fascial restrictions, impaired perceived functional ability, increased muscle spasms, impaired flexibility, improper body mechanics, and pain.    ACTIVITY LIMITATIONS: carrying, lifting, bending, standing, squatting, stairs, transfers, and locomotion level   PARTICIPATION LIMITATIONS: cleaning, interpersonal relationship, community activity, occupation, yard work, and exercise routine, walking dog   PERSONAL FACTORS: Time since onset of injury/illness/exacerbation and multiple treatment areas, lupus  are also affecting patient's functional outcome.    REHAB POTENTIAL: Good   CLINICAL DECISION MAKING: Stable/uncomplicated   EVALUATION COMPLEXITY: Low     GOALS: Goals reviewed with patient? Yes   SHORT TERM GOALS: (target date for Short term goals are 3 weeks 08/16/2022)    1.  Patient will demonstrate independent use of home exercise program to  maintain progress from in clinic treatments.   Goal status: Met 08/16/2022   LONG TERM GOALS: (target dates for all long term goals are 10 weeks  12/07/2022 )   1. Patient will demonstrate/report pain at worst less than or equal to 2/10 to facilitate minimal limitation in daily activity secondary to pain symptoms.   Goal status: on going 11/02/2022   2. Patient will demonstrate independent use of home exercise program to facilitate ability to maintain/progress functional gains from skilled physical therapy services.   Goal status: on going 11/02/2022   3. Patient will demonstrate FOTO outcome > or = 58 % to indicate reduced disability due to condition.   Goal status:on going 11/02/2022   4.  Patient will demonstrate Lt LE MMT 5/5 , dynamometry within 15 % of Rt, Rt shoulder MMT 5/5 throughout to faciltiate usual transfers, stairs, squatting at [facility] for daily life.    Goal status: on going 11/02/2022   5.  Patient will demonstrate Lt passive hamstring SLR to 90 deg equal to Rt s symptoms for normal mobility.  Goal status: on going 11/02/2022   6.  Patient will demonstrate bilateral SLS > 30 seconds to facilitate stability in ambulation on even and uneven ground.  Goal status:on going 11/02/2022   7.  Patient will demonstrate lumbar AROM WFL s symptoms to facilitate usual mobility at PLOF in standing/walking.  Goal Status: on going 11/02/2022    PLAN:   PT FREQUENCY: 1-2x/week   PT DURATION: 8 weeks   PLANNED INTERVENTIONS: Therapeutic exercises, Therapeutic activity, Neuro Muscular re-education, Balance training, Gait training, Patient/Family education, Joint mobilization, Stair training, DME instructions, Dry Needling, Electrical stimulation, Traction, Cryotherapy, vasopneumatic deviceMoist heat, Taping, Ultrasound, Ionotophoresis '4mg'$ /ml Dexamethasone, and Manual therapy.  All included unless contraindicated   PLAN FOR NEXT SESSION: Dry needling follow up as desired. Strengthening  program with stability inclusion for UE/LE.  Emphasis on gym return as able.     Scot Jun, PT, DPT, OCS, ATC 11/08/22  9:25 AM

## 2022-11-10 ENCOUNTER — Other Ambulatory Visit: Payer: Self-pay | Admitting: *Deleted

## 2022-11-10 MED ORDER — CROMOLYN SODIUM 100 MG/5ML PO CONC: ORAL | 5 refills | Status: DC

## 2022-11-11 ENCOUNTER — Ambulatory Visit: Payer: BC Managed Care – PPO | Admitting: Rehabilitative and Restorative Service Providers"

## 2022-11-11 ENCOUNTER — Encounter: Payer: Self-pay | Admitting: Rehabilitative and Restorative Service Providers"

## 2022-11-11 DIAGNOSIS — M79605 Pain in left leg: Secondary | ICD-10-CM

## 2022-11-11 DIAGNOSIS — M6281 Muscle weakness (generalized): Secondary | ICD-10-CM

## 2022-11-11 DIAGNOSIS — M25511 Pain in right shoulder: Secondary | ICD-10-CM | POA: Diagnosis not present

## 2022-11-11 DIAGNOSIS — R262 Difficulty in walking, not elsewhere classified: Secondary | ICD-10-CM | POA: Diagnosis not present

## 2022-11-11 DIAGNOSIS — G8929 Other chronic pain: Secondary | ICD-10-CM

## 2022-11-11 NOTE — Therapy (Signed)
OUTPATIENT PHYSICAL THERAPY TREATMENT NOTE   Patient Name: Robin [name] MRN: [id number] DOB:04-06-1971, 52 y.o., female Today's Date: 11/11/2022    END OF SESSION:   PT End of Session - 11/11/22 0819     Visit Number 14    Number of Visits 23    Date for PT Re-Evaluation 12/07/22    Authorization Type [employer] [id number] copay    Authorization - Number of Visits 19    PT Start Time 0809    PT Stop Time [id number]    PT Time Calculation (min) 38 min    Activity Tolerance Patient tolerated treatment well    Behavior During Therapy WFL for tasks assessed/performed              Past Medical History:  Diagnosis Date   ADD (attention deficit disorder)    Allergy    Anemia    Anorexia nervosa with bulimia    Per [place] [facility] Patient Packet   Anxiety    Asthma    Carpal tunnel syndrome    Per [place] [facility] Patient Packet   Depression    Dry eyes    Per [place] New Patient Packet   Dry mouth    Per [place] New Patient Packet   Fibroids    Per [place] New Patient Packet   History of hip x-ray 2021   Per [place] [facility] Patient Packet   History of Papanicolaou smear of cervix 2021   Per [place] New Patient Packet, Dr.Pickens   Hyperlipidemia    Ileitis    Lupus ([place])    PONV (postoperative nausea and vomiting)    mild nausea    Raynaud disease    Retinal tear    Per [facility] New Patient Packet   Systemic lupus erythematosus ([place])    Past Surgical History:  Procedure Laterality Date   CARPAL TUNNEL RELEASE Right    08/2019   CESAREAN SECTION  2003   CESAREAN SECTION  2005   Per [facility] New Patient Packet   COLONOSCOPY  2017   Dr.Jacobs   cyst drained Left 2023   knee   DENTAL SURGERY  2009   Fromed tooth and chronic infection   DILITATION & CURRETTAGE/HYSTROSCOPY WITH NOVASURE ABLATION N/A 06/03/2020   Procedure: DILATATION & CURETTAGE/HYSTEROSCOPY WITH NOVASURE ABLATION;  Surgeon: Aletha Halim, MD;  Location: [place];  Service: Gynecology;  Laterality: N/A;   ENDOMETRIAL BIOPSY   04/20/2020       EYE SURGERY Bilateral    cryoretinopexy   Patient Active Problem List   Diagnosis Date Noted   Left hamstring injury 07/15/2022   Labral tear of shoulder, degenerative, right 06/21/2022   Left carpal tunnel syndrome 05/26/2022   Foot sprain, left, initial encounter 05/19/2022   Baker's cyst of knee, left 04/21/2022   Right carpal tunnel syndrome 03/16/2022   Flushing 04/14/2021   Hypermobility syndrome 11/27/2020   Seasonal and perennial allergic rhinitis 11/06/2018   Mild intermittent asthma without complication [id number]   Flexural atopic dermatitis 11/06/2018   History of hyperlipidemia 11/08/2016   History of anxiety 11/08/2016   History of retinal tear 11/08/2016   History of Bilateral plantar fasciitis 08/10/2016   High risk medication use 07/04/2016   Lupus ([place]) 05/23/2016   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[place]) 03/18/2016   ADHD (attention deficit hyperactivity disorder) 12/22/2014   Raynaud's phenomenon 10/17/2013   Hyperlipidemia 09/28/2011     THERAPY DIAG:  Chronic right shoulder pain  Pain in left leg  Muscle weakness (generalized)  Difficulty in walking, not elsewhere classified  PCP: Ngetich, Nelda Bucks NP   REFERRING PROVIDER: Leandrew Koyanagi, MD   REFERRING DIAG: [phone] (ICD-10-CM) - Left hamstring injury, initial encounter    Rationale for Evaluation and Treatment: Rehabilitation   ONSET DATE: Around 07/13/2022 for hamstring, several years for other complaints of Rt shoulder pain and back pain   SUBJECTIVE:    SUBJECTIVE STATEMENT: She indicated she thought last visit with needling was helpful.  Had some muscle soreness but short term.  She indicated having some "joint" pain increases in several joints that started last night and impacted sleeping.  Described it as a "flare up."  She mentioned knee might need to get drained again.    PERTINENT HISTORY: Lt hamstring tear grade 1. Bakers Cyst per MD note.  Med history : lupus  NEW MRI Rt  shoulder IMPRESSION: 1. Moderate tendinosis of the supraspinatus tendon with a partial-thickness bursal surface tear and a partial-thickness articular surface tear. 2. Mild tendinosis of the infraspinatus tendon. 3. Mild subacromial/subdeltoid bursitis.   PAIN:  Pain location: chief symptoms for treatment 0/10 pain upon arrival.  Joint symptoms 7/10 (knees, feet, hip, lower back0 Description:  tenderness, tightness, spasms Aggravating factors: Rt shoulder: preventative reaching, wiping, throwing, pressure. Relieving factors: taping, stretching, previous treatment, injection helped   PRECAUTIONS: None   WEIGHT BEARING RESTRICTIONS: No   FALLS:  Has patient fallen in last 6 months? Yes 2x - Outside of house Pt indicated a fear of falling.     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  Heal hamstring completely     OBJECTIVE:     PATIENT SURVEYS:  11/08/2022:  FOTO update 51  08/16/2022: FOTO update 49  07/26/2022 FOTO intake:31 predicted:  3   COGNITION: 07/26/2022 Overall cognitive status: WFL                        SENSATION: 07/26/2022 WFL   MUSCLE LENGTH: 07/26/2022 Hamstrings passive SLR supine: Right 90 deg; Left 70 deg c pain   POSTURE:  07/26/2022 unremarkable in standing   PALPATION: 07/26/2022 Tenderness noted to light touch in Lt lumbar paraspinals   ROM:    ROM 07/26/2022 Right 07/26/2022 Left 07/26/2022 08/09/2022 Right 08/23/22  Shoulder flexion        WNL  Shoulder abduction        WNL  Shoulder internal rotation        WNL  Shoulder external rotation        WNL         Knee flexion          Knee extension                     Lumbar extension 75% WFL c  end range back complaints   Repeated x 5 in standing c ERP worsened     75% WFL c tightness lower back (more on Lt)   Lumbar flexion To floor no complaints     Hands flat to floor               (Blank rows = not tested)   UPPER EXTREMITY ROM:  ROM Right eval Left eval  Shoulder flexion  WFL with painful arc in sitting and supine   Shoulder extension     Shoulder abduction     Shoulder adduction     Shoulder extension     Shoulder internal rotation  60 AROM in 45 deg abduction   Shoulder external rotation  80 AROM in 45 deg abduction    Elbow flexion     Elbow extension     Wrist flexion          Lower trap     Middle trap                (Blank rows = not tested)    LOWER EXTREMITY MMT:   MMT Right 07/26/2022 Left 07/26/2022 Left 07/28/22 Left 08/09/2022 Left 1/31/204  Hip flexion 5/5 5/5     Hip extension Check next visit Check next visit 4/5    Hip abduction Check next visit Check next visit 4/5    Hip adduction         Hip internal rotation         Hip external rotation         Knee flexion 5/5   39, 36.4 lbs   3+/5 14 lbs With  pain   4/5 20.6, 18.4 lbs 4/5 26, 23 lbs  Knee extension 5/5 5/5     Ankle dorsiflexion 5/5 5/5     Ankle plantarflexion         Ankle inversion         Ankle eversion          (Blank rows = not tested)  UPPER EXTREMITY MMT:  MMT Right 08/23/22 Right 10/12/2021 Right 11/02/2022 Left 11/02/2022  Shoulder flexion 5 5/5 c pain    Shoulder extension      Shoulder abduction 4 3+/5 c pain    Shoulder adduction      Shoulder extension      Shoulder internal rotation 4 5/5    Shoulder external rotation 4- 4/5 c pain    Middle trapezius   4/5 4/5  Lower trapezius   4/5 4/5  Elbow flexion      Elbow extension      Wrist flexion      Wrist extension      Wrist ulnar deviation      Wrist radial deviation      Wrist pronation      Wrist supination      Grip strength       (Blank rows = not tested)    SPECIAL TESTS:   07/26/2022 Lt slump test positive for complaints in thigh and calf, Negative on Rt Negative crossed slr bilateral for any radicular pain   FUNCTIONAL TESTS:  08/16/2022:  Lt SLS:  18 seconds  07/26/2022 18 inch chair transfer:  1st try without hands Lt SLS:  10 seconds, stopped due to back related complaints Rt SLS:  30 seconds c mild aberrant movement   GAIT: 07/26/2022 Independent ambulation      TODAY'S TREATMENT  11/11/2022:  Therex UBE UE/ LE 5 mins fwd lvl 5.0, 3 mins backward lvl 3.0 Knee flexion double leg up, Lt leg eccentric return 20 lbs 3 x 10   Lateral stepping green band c weighted ball 6.6 lbs squats to  reactive light touching 45 seconds x 4 c 15 second breaks Green tband ER c flexion punch 2 x 10 , performed bilaterally  Neuro Re-ed  SLS c reactive light touching 4 square blazepods 30 sec  x 3 bilateral Lateral stepping reactive to 3 blazepods 2 second time out 30 sec x 2 bilateral  11/08/2022: Manual: Compression applied and skilled palpation with DN  Trigger Point Dry-Needling  Treatment instructions: Expect mild to moderate muscle soreness. S/S of pneumothorax if dry needled over a lung field, and to seek immediate medical attention should they occur. Patient verbalized understanding of these instructions and education.  Patient Consent Given: Yes Education handout provided: Previously provided Muscles treated: Lt hamstrings, and gastroc Treatment response/outcome: twitch response c concordant symptoms.   Therex UBE LE Prone swimmers with lift over cones to go from y to t to ext x 10 bilateral Push up hold on knees with SA hold with contralateral arm touching to cones with reactive color call outs x 20 bilateral RDL x 15 bilateral with occasional hand assist Hip hike c hip abduction into doorframe review 5 sec hold x 10 bilateral Knee flexion double leg up, Lt leg eccentric return 15 lbs  x 10 , 20 lbs x 10   11/02/2022: Therex:  Prone lying chest  on pball with knees on table y's with green band between hands 2 x 10 3 sec hold, t's with 1 lb 2 x 10 3 sec hold, gh ext 1 lb 2 x 10 c 3 sec hold  Standing rows and extensions green 2X10 Standing bilateral shoulder ER c scap retraction green x 15 Standing hip hike in doorframe with contralateral hip abduction into doorframe 5 sec hold x 10 bilateral Standing RDL x 10 bilateral  10/28/2022: Manual: Compression applied and skilled palpation with DN  Trigger Point Dry-Needling  Treatment instructions: Expect mild to moderate muscle soreness. S/S of pneumothorax if dry needled over a lung field, and to seek immediate medical attention should they occur. Patient verbalized understanding of these instructions and education.  Patient Consent Given: Yes Education handout provided: Previously provided Muscles treated: Rt infraspinatus, supraspinatus,deltoids; Lt hamstrings, and gastroc Treatment response/outcome: twitch response c concordant symptoms.   Therex:  Prone Y,T,I X 10 each on Rt, holding 3 sec Standing rows and extensions green 2X10 Standing shoulder ER green 2X10 bilat Standing shoulder IR green 2X10 bilat Standing hip hikes  X10 bilat with UE support     PATIENT EDUCATION:   10/12/2022 Education details: HEP update Person educated: Patient Education method: Consulting civil engineer, Demonstration, Verbal cues, and Handouts Education comprehension: verbalized understanding, returned demonstration, and verbal cues required   HOME EXERCISE PROGRAM: Access Code: [email or web address] URL: [email or web address] Date: 10/26/2022 Prepared by: Elsie Ra  Exercises - Supine Bridge  - 1-2 x daily - 7 x weekly - 1-2 sets - 10 reps - 2 hold - Supine 90/90 Sciatic Nerve Glide with Knee Flexion/Extension  - 1-2 x daily - 7 x weekly - 1-2 sets - 10 reps - Sidelying Hip Abduction  - 1-2 x daily - 7 x weekly - 2-3 sets - 10-15 reps - Standing Hip Hiking  - 1-2 x daily - 7 x weekly - 1 sets - 10  reps - 5 hold - Seated Hamstring Set (Mirrored)  - 2-3 x daily - 7 x weekly - 1 sets - 10-15 reps - 5-10 hold - Prone Alternating Arm and Leg Lifts  - 1-2 x daily - 7 x weekly - 1-2 sets - 10 reps - 3 hold - Sidelying Lumbar Rotation Stretch (Mirrored)  - 2-3 x daily - 7 x weekly - 1 sets - 5 reps - 15 hold - Standing Isometric Shoulder Internal Rotation at Doorway  -  2 x daily - 6 x weekly - 1-2 sets - 10 reps - 5 hold - Standing Isometric Shoulder External Rotation with Doorway (Mirrored)  - 2 x daily - 6 x weekly - 1-2 sets - 10 reps - 5 hold - Standing Isometric Shoulder Flexion with Doorway - Arm Bent  - 2 x daily - 6 x weekly - 1-2 sets - 10 reps - 5 hold - Standing Isometric Shoulder Abduction with Doorway - Arm Bent (Mirrored)  - 2 x daily - 6 x weekly - 1-2 sets - 10 reps - 5 hold - Sidelying Shoulder External Rotation (Mirrored)  - 1-2 x daily - 7 x weekly - 2-3 sets - 10-15 reps - Standing Shoulder Posterior Capsule Stretch (Mirrored)  - 2-3 x daily - 7 x weekly - 1 sets - 5 reps - 15-30 hold - Prone Single Arm Shoulder Y  - 2 x daily - 6 x weekly - 2-3 sets - 10 reps - 3 sec hold - Prone Shoulder Horizontal Abduction  - 2 x daily - 3-4 x weekly - 2-3 sets - 10 reps - 3 sec hold - Prone Shoulder Extension - Single Arm  - 2 x daily - 3-4 x weekly - 2-3 sets - 10 reps - 3 sec hold   ASSESSMENT:   CLINICAL IMPRESSION: Additional more systemic reaction symptoms reported today but not a hindrance to activity within clinic today.  Focused on stability interventions and muscle strengthening to help improve control of mobility.   Continued skilled PT services indicated with manual and dry needling performed as requested based off symptom presentation.   OBJECTIVE IMPAIRMENTS: decreased activity tolerance, decreased balance, decreased coordination, decreased endurance, decreased mobility, difficulty walking, decreased ROM, decreased strength, increased fascial restrictions, impaired perceived  functional ability, increased muscle spasms, impaired flexibility, improper body mechanics, and pain.    ACTIVITY LIMITATIONS: carrying, lifting, bending, standing, squatting, stairs, transfers, and locomotion level   PARTICIPATION LIMITATIONS: cleaning, interpersonal relationship, community activity, occupation, yard work, and exercise routine, walking dog   PERSONAL FACTORS: Time since onset of injury/illness/exacerbation and multiple treatment areas, lupus  are also affecting patient's functional outcome.    REHAB POTENTIAL: Good   CLINICAL DECISION MAKING: Stable/uncomplicated   EVALUATION COMPLEXITY: Low     GOALS: Goals reviewed with patient? Yes   SHORT TERM GOALS: (target date for Short term goals are 3 weeks 08/16/2022)    1.  Patient will demonstrate independent use of home exercise program to maintain progress from in clinic treatments.   Goal status: Met 08/16/2022   LONG TERM GOALS: (target dates for all long term goals are 10 weeks  12/07/2022 )   1. Patient will demonstrate/report pain at worst less than or equal to 2/10 to facilitate minimal limitation in daily activity secondary to pain symptoms.   Goal status: on going 11/02/2022   2. Patient will demonstrate independent use of home exercise program to facilitate ability to maintain/progress functional gains from skilled physical therapy services.   Goal status: on going 11/02/2022   3. Patient will demonstrate FOTO outcome > or = 58 % to indicate reduced disability due to condition.   Goal status:on going 11/02/2022   4.  Patient will demonstrate Lt LE MMT 5/5 , dynamometry within 15 % of Rt, Rt shoulder MMT 5/5 throughout to faciltiate usual transfers, stairs, squatting at [facility] for daily life.    Goal status: on going 11/02/2022   5.  Patient will demonstrate Lt passive hamstring SLR to  90 deg equal to Rt s symptoms for normal mobility.  Goal status: on going 11/02/2022   6.  Patient will demonstrate bilateral  SLS > 30 seconds to facilitate stability in ambulation on even and uneven ground.  Goal status:on going 11/02/2022   7.  Patient will demonstrate lumbar AROM WFL s symptoms to facilitate usual mobility at PLOF in standing/walking.  Goal Status: on going 11/02/2022    PLAN:   PT FREQUENCY: 1-2x/week   PT DURATION: 8 weeks   PLANNED INTERVENTIONS: Therapeutic exercises, Therapeutic activity, Neuro Muscular re-education, Balance training, Gait training, Patient/Family education, Joint mobilization, Stair training, DME instructions, Dry Needling, Electrical stimulation, Traction, Cryotherapy, vasopneumatic deviceMoist heat, Taping, Ultrasound, Ionotophoresis '4mg'$ /ml Dexamethasone, and Manual therapy.  All included unless contraindicated   PLAN FOR NEXT SESSION: Continued stability and strengthening as tolerated.    Scot Jun, PT, DPT, OCS, ATC 11/11/22  8:47 AM

## 2022-11-16 ENCOUNTER — Encounter: Payer: Self-pay | Admitting: Sports Medicine

## 2022-11-16 ENCOUNTER — Ambulatory Visit: Payer: BC Managed Care – PPO | Admitting: Sports Medicine

## 2022-11-16 DIAGNOSIS — M25562 Pain in left knee: Secondary | ICD-10-CM

## 2022-11-16 DIAGNOSIS — M357 Hypermobility syndrome: Secondary | ICD-10-CM

## 2022-11-16 DIAGNOSIS — M25462 Effusion, left knee: Secondary | ICD-10-CM | POA: Diagnosis not present

## 2022-11-16 DIAGNOSIS — F331 Major depressive disorder, recurrent, moderate: Secondary | ICD-10-CM | POA: Diagnosis not present

## 2022-11-16 DIAGNOSIS — S86112A Strain of other muscle(s) and tendon(s) of posterior muscle group at lower leg level, left leg, initial encounter: Secondary | ICD-10-CM | POA: Diagnosis not present

## 2022-11-16 NOTE — Progress Notes (Signed)
States that the swelling has returned Wants it aspirated again today  Also wants to discuss shockwave

## 2022-11-16 NOTE — Progress Notes (Signed)
Robin [name] - 52 y.o. female MRN [id number]  Date of birth: 06-Aug-1971  Office Visit Note: Visit Date: 11/16/2022 PCP: Robin Hughs, NP Referred by: Robin Hughs, NP  Subjective: Chief Complaint  Patient presents with   Left Knee - Pain, Follow-up   HPI: Robin [name] is a pleasant 52 y.o. female who presents today for follow-up of left knee pain, Baker's cyst, hypermobility syndrome.  We did discuss on the telephone her results of her MRI, which do show some gastrocnemius tendinopathic changes.  She has questions regarding shockwave therapy today.  She also notes that she has had a small recurrence of her Baker's cyst with posterior swelling, however over the last few days this has gone down rather significantly with some rest, ice.  Robin [name] does tell me today that she has been having good improvement for all left-sided leg pain with Robin [name] and Robin [name] with physical therapy, especially dry needling.  She is interested in continuing this.  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  Pertinent ROS were reviewed with the patient and found to be negative unless otherwise specified above in HPI.   Assessment & Plan: Visit Diagnoses:  1. Strain of gastrocnemius muscle of left lower extremity, initial encounter   2. Pain and swelling of left knee    Plan: Discussed with Robin [name] overall treatment for extracorporeal shockwave therapy and the respective benefits.  Through shared decision-making, did proceed with a trial of this today.  She will follow-up in 1 week and then after the second treatment we will see if this is something that we will continue for further benefit.  She will continue physical therapy and dry needling.  Discussed with her recurrent Baker's cyst, if this does return larger we will proceed with ultrasound-guided aspiration and I will attempt at that time to  make multiple fenestrations to open up holes to hopefully help prevent this from recurring so frequently.  Ultimately, we did discuss there is a surgical bursa removal that she could consider in future.   Follow-up: Return in about 1 week (around 11/23/2022) for for right knee.   Meds & Orders: No orders of the defined types were placed in this encounter.  No orders of the defined types were placed in this encounter.    Procedures:  Procedure: ECSWT Indications: Gastrocnemius tendinopathic changes   Procedure Details Consent: Risks of procedure as well as the alternatives and risks of each were explained to the patient.  Verbal consent for procedure obtained. Time Out: Verified patient identification, verified procedure, site was marked, verified correct patient position. The area was cleaned with alcohol swab.     The left proximal gastroc, distal hamstring, and posterior knee was targeted for Extracorporeal shockwave therapy.    Preset: Status post muscular injury Power Level: 100 mJ Frequency: 12 Hz Impulse/cycles: 2800 Head size: Regular   Patient tolerated procedure well without immediate complications.        Clinical History: No specialty comments available.  She reports that she quit smoking about 21 years ago. Her smoking use included cigarettes. She has a 7.50 pack-year smoking history. She has never been exposed to tobacco smoke. She has never used smokeless tobacco. No results for input(s): "HGBA1C", "LABURIC" in the last 8760 hours.  Objective:    Physical Exam  Gen: Well-appearing, in no acute distress; non-toxic CV: Regular Rate. Well-perfused. Warm.  Resp: Breathing unlabored on  room air; no wheezing. Psych: Fluid speech in conversation; appropriate affect; normal thought process Neuro: Sensation intact throughout. No gross coordination deficits.   Ortho Exam -Left knee: There is some very mild soft tissue swelling about the anterior and posterior aspect of  the knee.  Small Baker's cyst cyst likely palpated.  There is no blocks to flexion or extension of the knee.  Mild TTP palpating along the proximal aspect of the medial head of the gastrocnemius tendon.  Imaging: No results found.  Past Medical/Family/Surgical/Social History: Medications & Allergies reviewed per EMR, new medications updated. Patient Active Problem List   Diagnosis Date Noted   Left hamstring injury 07/15/2022   Labral tear of shoulder, degenerative, right 06/21/2022   Left carpal tunnel syndrome 05/26/2022   Foot sprain, left, initial encounter 05/19/2022   Baker's cyst of knee, left 04/21/2022   Right carpal tunnel syndrome 03/16/2022   Flushing 04/14/2021   Hypermobility syndrome 11/27/2020   Seasonal and perennial allergic rhinitis 11/06/2018   Mild intermittent asthma without complication [id number]   Flexural atopic dermatitis 11/06/2018   History of hyperlipidemia 11/08/2016   History of anxiety 11/08/2016   History of retinal tear 11/08/2016   History of Bilateral plantar fasciitis 08/10/2016   High risk medication use 07/04/2016   Lupus ([place]) 05/23/2016   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[place]) 03/18/2016   ADHD (attention deficit hyperactivity disorder) 12/22/2014   Raynaud's phenomenon 10/17/2013   Hyperlipidemia 09/28/2011   Past Medical History:  Diagnosis Date   ADD (attention deficit disorder)    Allergy    Anemia    Anorexia nervosa with bulimia    Per [place] [facility] Patient Packet   Anxiety    Asthma    Carpal tunnel syndrome    Per [place] [facility] Packet   Depression    Dry eyes    Per [place] [facility] Patient Packet   Dry mouth    Per [place] [facility] Patient Packet   Fibroids    Per [place] [facility] Patient Packet   History of hip x-ray 2021   Per [place] [facility] Packet   History of Papanicolaou smear of cervix 2021   Per [place] [facility] Patient Packet, Dr.Pickens   Hyperlipidemia    Ileitis    Lupus ([place])    PONV (postoperative nausea and vomiting)    mild nausea    Raynaud  disease    Retinal tear    Per [place] New Patient Packet   Systemic lupus erythematosus ([place])    Family History  Problem Relation Age of Onset   Heart disease Mother 46       CAD/stenting   Hyperlipidemia Mother    Arthritis Mother        ostearthritis   Hypertension Mother    Kidney disease Mother    Irritable bowel syndrome Mother    Stroke Mother 90       CVA   Fibromyalgia Mother    Eczema Mother    Sjogren's syndrome Mother    Hyperlipidemia Father    Neuropathy Father    Allergies Father        shellfish   Prostatitis Father    Hyperlipidemia Sister    ADD / ADHD Sister    Bipolar disorder Sister    Depression Sister    Hypertension Brother    OCD Brother    Anxiety disorder Daughter    ADD / ADHD Daughter    Asthma Daughter    OCD Son    Colon cancer Neg Hx  Inflammatory bowel disease Neg Hx    Allergic rhinitis Neg Hx    Angioedema Neg Hx    Atopy Neg Hx    Immunodeficiency Neg Hx    Urticaria Neg Hx    Past Surgical History:  Procedure Laterality Date   CARPAL TUNNEL RELEASE Right    08/2019   CESAREAN SECTION  2003   CESAREAN SECTION  2005   Per [facility] Patient Packet   COLONOSCOPY  2017   Dr.Jacobs   cyst drained Left 2023   knee   DENTAL SURGERY  2009   Fromed tooth and chronic infection   DILITATION & CURRETTAGE/HYSTROSCOPY WITH NOVASURE ABLATION N/A 06/03/2020   Procedure: DILATATION & CURETTAGE/HYSTEROSCOPY WITH NOVASURE ABLATION;  Surgeon: Aletha Halim, MD;  Location: [place] [facility];  Service: Gynecology;  Laterality: N/A;   ENDOMETRIAL BIOPSY  04/20/2020       EYE SURGERY Bilateral    cryoretinopexy   Social History   Occupational History   Not on file  Tobacco Use   Smoking status: Former    Packs/day: 0.75    Years: 10.00    Total pack years: 7.50    Types: Cigarettes    Quit date: 07/05/2001    Years since quitting: 21.3    Passive exposure: Never   Smokeless tobacco: Never  Vaping Use   Vaping Use: Never  used  Substance and Sexual Activity   Alcohol use: Yes    Comment: occ   Drug use: Never   Sexual activity: Yes    Birth control/protection: Condom

## 2022-11-16 NOTE — Progress Notes (Signed)
Office Visit Note  Patient: Robin [name]             Date of Birth: [birth year]           MRN: [id number]             PCP: Sandrea Hughs, NP Referring: Sandrea Hughs, NP Visit Date: 11/30/2022 Occupation: @GUAROCC @  Subjective:  Medication monitoring   History of Present Illness: Robin [name] is a 52 y.o. female with history of systemic lupus and osteoarthritis.  Patient remains on Plaquenil 200 mg 1 tablet by mouth twice daily.  She has been tolerating Plaquenil without any side effects.  She was last seen in the office on 05/26/2022.  According to the patient she remains under the care of of Dr. Erlinda Hong and Dr. Rolena Infante (sports medicine) for several soft tissue concerns.  According to the patient she is currently going to physical therapy for management of right shoulder pain and left knee pain.  According to the patient she has had a recurrent Baker's cyst in the left knee requiring aspiration.  She has started to notice gradual improvement in her right shoulder and knee discomfort.  She has been started on low-dose naltrexone prescribed by her allergist starting in September 2023.  She has noticed an improvement in her energy level since starting on naltrexone.  She states that she has eliminated gluten from her diet and has been trying to avoid pro inflammatory foods which she has found to be helpful.  She continues to have intermittent symptoms of Raynaud's phenomenon.  She also has ongoing chronic sicca symptoms.  She has been drinking water throughout the day to help with mild dryness and has been using refresh eyedrops for dry eyes.  She has not yet started on Restasis drops.  She has had less frequent sores in her mouth and nose.  She continues to try to avoid direct sun exposure as encouraged.    Activities of Daily Living:  Patient reports morning stiffness for 2 hours.   Patient Reports nocturnal pain.  Difficulty dressing/grooming: Reports Difficulty climbing stairs:  Reports Difficulty getting out of chair: Reports Difficulty using hands for taps, buttons, cutlery, and/or writing: Reports  Review of Systems  Constitutional:  Positive for fatigue.  HENT:  Positive for mouth sores and mouth dryness. Negative for nose dryness.   Eyes:  Positive for dryness. Negative for pain and visual disturbance.  Respiratory:  Negative for cough, hemoptysis, shortness of breath and difficulty breathing.   Cardiovascular:  Negative for chest pain, palpitations, hypertension and swelling in legs/feet.  Gastrointestinal:  Positive for constipation and diarrhea. Negative for blood in stool.  Endocrine: Positive for increased urination.  Genitourinary:  Negative for painful urination and involuntary urination.  Musculoskeletal:  Positive for joint pain, gait problem, joint pain, joint swelling, myalgias, muscle weakness, morning stiffness, muscle tenderness and myalgias.  Skin:  Positive for color change, rash and sensitivity to sunlight. Negative for pallor, hair loss, nodules/bumps, skin tightness and ulcers.  Allergic/Immunologic: Positive for susceptible to infections.  Neurological:  Positive for dizziness and headaches. Negative for numbness and weakness.  Hematological:  Negative for swollen glands.  Psychiatric/Behavioral:  Positive for depressed mood and sleep disturbance. The patient is nervous/anxious.     PMFS History:  Patient Active Problem List   Diagnosis Date Noted   Left hamstring injury 07/15/2022   Labral tear of shoulder, degenerative, right 06/21/2022   Left carpal tunnel syndrome 05/26/2022   Foot sprain, left,  initial encounter 05/19/2022   Baker's cyst of knee, left 04/21/2022   Right carpal tunnel syndrome 03/16/2022   Flushing 04/14/2021   Hypermobility syndrome 11/27/2020   Seasonal and perennial allergic rhinitis 11/06/2018   Mild intermittent asthma without complication 20/94/7096   Flexural atopic dermatitis 11/06/2018   History of  hyperlipidemia 11/08/2016   History of anxiety 11/08/2016   History of retinal tear 11/08/2016   History of Bilateral plantar fasciitis 08/10/2016   High risk medication use 07/04/2016   Lupus ([place]) 05/23/2016   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[place]) 03/18/2016   ADHD (attention deficit hyperactivity disorder) 12/22/2014   Raynaud's phenomenon 10/17/2013   Hyperlipidemia 09/28/2011    Past Medical History:  Diagnosis Date   ADD (attention deficit disorder)    Allergy    Anemia    Anorexia nervosa with bulimia    Per [place] [facility] Patient Packet   Anxiety    Asthma    Carpal tunnel syndrome    Per [place] [facility] Patient Packet   Depression    Dry eyes    Per [place] New Patient Packet   Dry mouth    Per [place] New Patient Packet   Fibroids    Per [place] New Patient Packet   History of hip x-ray 2021   Per [place] New Patient Packet   History of Papanicolaou smear of cervix 2021   Per [place] New Patient Packet, Dr.Pickens   Hyperlipidemia    Ileitis    Lupus ([place])    PONV (postoperative nausea and vomiting)    mild nausea    Raynaud disease    Retinal tear    Per [place] New Patient Packet   Systemic lupus erythematosus ([place])     Family History  Problem Relation Age of Onset   Heart disease Mother 20       CAD/stenting   Hyperlipidemia Mother    Arthritis Mother        ostearthritis   Hypertension Mother    Kidney disease Mother    Irritable bowel syndrome Mother    Stroke Mother 47       CVA   Fibromyalgia Mother    Eczema Mother    Sjogren's syndrome Mother    Hyperlipidemia Father    Neuropathy Father    Allergies Father        shellfish   Prostatitis Father    Hyperlipidemia Sister    ADD / ADHD Sister    Bipolar disorder Sister    Depression Sister    Hypertension Brother    OCD Brother    Anxiety disorder Daughter    ADD / ADHD Daughter    Asthma Daughter    OCD Son    Colon cancer Neg Hx    Inflammatory bowel disease Neg Hx    Allergic rhinitis Neg Hx    Angioedema Neg Hx    Atopy Neg  Hx    Immunodeficiency Neg Hx    Urticaria Neg Hx    Past Surgical History:  Procedure Laterality Date   CARPAL TUNNEL RELEASE Right    08/2019   CESAREAN SECTION  2003   CESAREAN SECTION  2005   Per [facility]   COLONOSCOPY  2017   Dr.Jacobs   cyst drained Left 2023   knee   DENTAL SURGERY  2009   Fromed tooth and chronic infection   DILITATION & CURRETTAGE/HYSTROSCOPY WITH NOVASURE ABLATION N/A 06/03/2020   Procedure: DILATATION & CURETTAGE/HYSTEROSCOPY WITH NOVASURE ABLATION;  Surgeon: Aletha Halim, MD;  Location: [facility];  Service: Gynecology;  Laterality: N/A;   ENDOMETRIAL BIOPSY  04/20/2020       EYE SURGERY Bilateral    cryoretinopexy   Social History   Social History Narrative   Marital status: married x 17 years; happily married      Children: 2 children (16, 94)      Lives: with husband, 2 children.      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 4 (including me)       Pets: 1 dog, 2 cats       Highest level or education completed: Undergraduate degree       Current/Past profession: Actuary       Exercise:        Yes          Type and how often: 30-60 min walking 5 days a week. Pilate's 1 x a week, and horseback riding 2 times a week          Living Will: No   DNR: no, would like to discuss one    POA/HPOA: No      Functional Status:   Do you have difficulty bathing or dressing yourself? Left blank    Do you have difficulty  preparing food or eating? Left blank   Do you have difficulty managing your medications? Left blank   Do you have difficulty managing your finances? Left blank   Do you have difficulty affording your medications? Left blank   Immunization History  Administered Date(s) Administered   Influenza,inj,Quad PF,6+ Mos 05/12/2014, 11/27/2015, 05/23/2016, 10/30/2017, 07/30/2018   Influenza-Unspecified 07/08/2022   Moderna Sars-Covid-2 Vaccination 10/27/2020, 10/31/2020   PFIZER(Purple Top)SARS-COV-2 Vaccination 12/06/2019, 12/16/2019, 05/27/2020, 07/07/2022   Pneumococcal Conjugate-13 05/23/2016   Tdap 09/28/2011, 08/12/2020     Objective: Vital Signs: BP 124/84 (BP Location: Left Arm, Patient Position: Sitting, Cuff Size: Normal)   Pulse 73   Resp 14   Ht 5\' 6"  (1.676 m)   Wt 160 lb (72.6 kg)   BMI 25.82 kg/m    Physical Exam Vitals and nursing note reviewed.  Constitutional:      Appearance: She is well-developed.  HENT:     Head: Normocephalic and atraumatic.  Eyes:     Conjunctiva/sclera: Conjunctivae normal.  Cardiovascular:     Rate and Rhythm: Normal rate and regular rhythm.     Heart sounds: Normal heart sounds.  Pulmonary:     Effort: Pulmonary effort is normal.     Breath sounds: Normal breath sounds.  Abdominal:     General: Bowel sounds are normal.     Palpations: Abdomen is soft.  Musculoskeletal:     Cervical back: Normal range of motion.  Lymphadenopathy:     Cervical: No cervical adenopathy.  Skin:    General: Skin is warm and dry.     Capillary Refill: Capillary refill takes less than 2 seconds.  Neurological:     Mental Status: She is alert and oriented to person, place, and time.  Psychiatric:        Behavior: Behavior normal.      Musculoskeletal Exam: C-spine, thoracic spine, lumbar spine have good range of motion.  Right shoulder has painful range of motion and and slightly limited range of motion with full abduction and internal rotation.  Left  shoulder has full range of motion with no discomfort.  Elbow joints, wrist joints, MCPs, PIPs, DIPs have good range of motion with no synovitis.  Complete fist formation bilaterally.  Hip joints have good range of motion with no groin pain.  Both knee joints have good range of motion with no warmth or effusion.  Ankle joints have good range of motion with some swelling in the left ankle.  CDAI Exam: CDAI Score: -- Patient Global: --; Provider Global: -- Swollen: --; Tender: -- Joint Exam 11/30/2022   No joint exam has been documented for this visit   There is currently no information documented on the homunculus. Go to the Rheumatology activity and complete the homunculus joint exam.  Investigation: No additional findings.  Imaging: No results found.  Recent Labs: Lab Results  Component Value Date   WBC 3.9 05/26/2022   HGB 14.5 05/26/2022   PLT 311 05/26/2022   NA 135 06/29/2022   K 3.8 06/29/2022   CL 99 06/29/2022   CO2 30 06/29/2022   GLUCOSE 83 06/29/2022   BUN 18 06/29/2022   CREATININE 0.88 06/29/2022   BILITOT 0.5 05/26/2022   ALKPHOS 26 (L) 06/01/2020   AST 19 05/26/2022   ALT 18 05/26/2022   PROT 7.4 05/26/2022   ALBUMIN 3.8 06/01/2020   CALCIUM 9.4 06/29/2022   GFRAA 93 01/26/2021    Speciality Comments: PLQ Eye Exam: 10/05/2022 @ [facility] (in [place]) Follow up in 1 year.   ANA 1:320 speckled, history of oral ulcers, lymphadenopathy, photosensitivity, Raynauds, erythema nodosum, inflammatory arthritis.  Procedures:  No procedures performed Allergies: Cromolyn sodium, Avelox [moxifloxacin hcl in nacl], Cefaclor, Dust mite extract, Latex, Mold extract [trichophyton], Moxifloxacin, Red wine complex [germanium], Meloxicam, and Naproxen    Assessment / Plan:     Visit Diagnoses: Other systemic lupus erythematosus with other organ involvement (HCC) - ANA 1:320 speckled, history of oral ulcers, lymphadenopathy, photosensitivity,  Raynauds, erythema nodosum, inflammatory arthritis: She has not had any signs or symptoms of a systemic lupus flare recently.  She has clinically been doing well taking Plaquenil 200 mg 1 tablet by mouth twice daily.  She continues to tolerate Plaquenil without any side effects.  She had no synovitis on examination today.  She continues to experience intermittent symptoms of Raynaud's phenomenon as well as ongoing sicca symptoms.  Discussed the use of basic bites, Biotene products, XyliMelts, refresh, and Restasis.  Discussed the importance of seeing the dentist at least every 6 months and her ophthalmologist on a yearly basis.  She has been experiencing less frequent sores in her mouth and nose. She has illuminated gluten from her diet and tries to remain active.  She has been started on naltrexone and has noticed an improvement in her energy level. Lab work from 05/26/2022 was reviewed today in the office: Complements within normal limits, ESR within normal limits, double-stranded DNA negative, and no proteinuria noted.  Labs were not consistent  with a lupus flare at that time.  The following lab work will be obtained today for further evaluation. She will remain on Plaquenil as prescribed.  Refill sent to the pharmacy today.  She was advised to notify us if she develops signs or symptoms of a flare.  She will follow-up in the office in 5 months or sooner if needed. - Plan: CBC with Differential/Platelet, COMPLETE METABOLIC PANEL WITH GFR, Protein / creatinine ratio, urine, Anti-DNA antibody, double-stranded, C3 and C4, Sedimentation rate, ANA, hydroxychloroquine (PLAQUENIL) 200 MG tablet  High risk medication use - Plaquenil 200 mg 1 tablet by mouth twice daily.  CBC and CMP were drawn on 05/26/2022.  Orders for CBC and CMP released today. PLQ Eye Exam: 10/05/2022 @ [facility] (in [place]) Follow up in 1 year.  - Plan: CBC with Differential/Platelet, COMPLETE METABOLIC PANEL WITH  GFR  Raynaud's phenomenon without gangrene: She continues to experience intermittent symptoms of Raynaud's phenomenon.  No digital ulcerations or signs of gangrene were noted today.  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   Chronic right shoulder pain:Under the care of Dr. Erlinda Hong.  MRI of the right shoulder obtained on 08/09/2022: Moderate tendinosis of the supraspinatus tendon with partial-thickness bursal surface tear and partial-thickness articular surface tear.  Mild tenderness is of the infraspinatus tendon.  Mild subacromial/subdeltoid bursitis.  She had a right glenohumeral joint cortisone injection on 08/23/22 and 10/17/2021 which provided temporary relief for about 1 month.  Currently going to physical therapy.    Primary osteoarthritis of both hands - XR of both hands 03/23/21: early osteoarthritic changes.  No synovitis noted on examination today.   Chronic pain of left knee: She has been under the care of Dr. Rolena Infante.  History of a recurrent Baker's cyst and hamstring tear per patient.  Currently going to PT. On examination she had good range of motion of the left knee with no warmth or effusion today.  Primary osteoarthritis of both feet - Osteoarthritis involving bilateral first MTP.  She continues to experience intermittent pain in both feet.  She is wearing proper fitting shoes.   Hypermobility of joint - Followed by Dr. Raeford Razor.   Other fatigue: Her energy level has improved since titration of the dose of naltrexone.   S/P carpal tunnel release - Dr. Rockwell Germany currently.   Chronic left SI joint pain: Not currently symptomatic.   Vitamin D deficiency - Vitamin D level will be checked today. 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 ([place])  Attention deficit hyperactivity disorder (ADHD), predominantly inattentive type  History of hyperlipidemia  History of anxiety  History  of retinal tear  Orders: Orders Placed This Encounter  Procedures   CBC with Differential/Platelet   COMPLETE METABOLIC PANEL WITH GFR   Protein / creatinine ratio, urine   Anti-DNA antibody, double-stranded   C3 and C4   Sedimentation rate   ANA   VITAMIN D 25 Hydroxy (Vit-D Deficiency, Fractures)   Meds ordered this encounter  Medications   hydroxychloroquine (PLAQUENIL) 200 MG tablet    Sig: Take 1 tablet (200 mg total) by mouth 2 (two) times daily.    Dispense:  180 tablet    Refill:  0     Follow-Up Instructions: Return in about 5 months (around 05/02/2023) for Systemic lupus erythematosus, Osteoarthritis.   Ofilia Neas, PA-C  Note - This record has been created using Dragon software.  Chart creation  errors have been sought, but may not always  have been located. Such creation errors do not reflect on  the standard of medical care.  

## 2022-11-23 ENCOUNTER — Ambulatory Visit: Payer: BC Managed Care – PPO | Admitting: Sports Medicine

## 2022-11-23 ENCOUNTER — Encounter: Payer: Self-pay | Admitting: Sports Medicine

## 2022-11-23 DIAGNOSIS — M25462 Effusion, left knee: Secondary | ICD-10-CM | POA: Diagnosis not present

## 2022-11-23 DIAGNOSIS — M25562 Pain in left knee: Secondary | ICD-10-CM | POA: Diagnosis not present

## 2022-11-23 DIAGNOSIS — S86112D Strain of other muscle(s) and tendon(s) of posterior muscle group at lower leg level, left leg, subsequent encounter: Secondary | ICD-10-CM

## 2022-11-23 NOTE — Progress Notes (Signed)
Doing good; here for shockwave #2 today

## 2022-11-23 NOTE — Progress Notes (Signed)
Robin [name] - 52 y.o. female MRN [id number]  Date of birth: May 21, 1971  Office Visit Note: Visit Date: 11/23/2022 PCP: Sandrea Hughs, NP Referred by: Sandrea Hughs, NP  Subjective: Chief Complaint  Patient presents with   Right Knee - Follow-up   HPI: Robin [name] is a pleasant 52 y.o. female who presents today for follow-up of left knee pain, Baker's cyst, hypermobility syndrome.   Previous MRI did show some tendinopathic changes of the gastrocnemius tendon. At last visit we did do a trial of extracorporeal shockwave therapy.  She did note some improvement from the session.  Notes that she has less pain as the day goes on.  Continues to work at home on home exercise plan and strengthening of the core hips and legs.  She does have an upcoming physical therapy appointment on Friday.  Pertinent ROS were reviewed with the patient and found to be negative unless otherwise specified above in HPI.   Assessment & Plan: Visit Diagnoses:  1. Strain of gastrocnemius muscle of left lower extremity, subsequent encounter   2. Pain and swelling of left knee    Plan: Discussed with [name] treatment options for her posterior knee pain.  Her Baker's cyst is controlled at this time.  She did find some benefit from the initial shockwave therapy, we did repeat this treatment today after shared decision making.  We we will consider repeating this on weekly intervals pending this with her formalized physical therapy and home rehab on manage healing and strengthening.  She may continue home medications as needed.  We will follow-up next week for reevaluation and consideration of additional ECSWT.  Did discuss that if her Baker's cyst does have a reoccurrence, we can always consider ultrasound-guided aspiration with fenestration.  Follow-up: Return in about 1 week (around 11/30/2022) for for posterior left knee.   Meds & Orders: No orders of the defined types were placed in this encounter.  No  orders of the defined types were placed in this encounter.    Procedures:   Procedure: ECSWT Indications: Gastrocnemius tendinopathic changes   Procedure Details Consent: Risks of procedure as well as the alternatives and risks of each were explained to the patient.  Verbal consent for procedure obtained. Time Out: Verified patient identification, verified procedure, site was marked, verified correct patient position. The area was cleaned with alcohol swab.     The left proximal gastroc, distal hamstring, and posterior knee was targeted for Extracorporeal shockwave therapy.    Preset: Status post muscular injury Power Level: 120 mJ Frequency: 12 Hz Impulse/cycles: 3000 Head size: Regular   Patient tolerated procedure well without immediate complications     Clinical History: No specialty comments available.  She reports that she quit smoking about 21 years ago. Her smoking use included cigarettes. She has a 7.50 pack-year smoking history. She has never been exposed to tobacco smoke. She has never used smokeless tobacco. No results for input(s): "HGBA1C", "LABURIC" in the last 8760 hours.  Objective:    Physical Exam  Gen: Well-appearing, in no acute distress; non-toxic CV:  Well-perfused. Warm.  Resp: Breathing unlabored on room air; no wheezing. Psych: Fluid speech in conversation; appropriate affect; normal thought process Neuro: Sensation intact throughout. No gross coordination deficits.   Ortho Exam - Left knee/leg: There is some very mild soft tissue swelling about the anterior and posterior aspect of the knee. Small Baker's cyst cyst likely palpated. There is no blocks to flexion or extension of the  knee. Mild TTP palpating along the proximal aspect of the medial head of the gastrocnemius tendon.   Imaging: No results found.  Past Medical/Family/Surgical/Social History: Medications & Allergies reviewed per EMR, new medications updated. Patient Active Problem List    Diagnosis Date Noted   Left hamstring injury 07/15/2022   Labral tear of shoulder, degenerative, right 06/21/2022   Left carpal tunnel syndrome 05/26/2022   Foot sprain, left, initial encounter 05/19/2022   Baker's cyst of knee, left 04/21/2022   Right carpal tunnel syndrome 03/16/2022   Flushing 04/14/2021   Hypermobility syndrome 11/27/2020   Seasonal and perennial allergic rhinitis 11/06/2018   Mild intermittent asthma without complication [id number]   Flexural atopic dermatitis 11/06/2018   History of hyperlipidemia 11/08/2016   History of anxiety 11/08/2016   History of retinal tear 11/08/2016   History of Bilateral plantar fasciitis 08/10/2016   High risk medication use 07/04/2016   Lupus ([place]) 05/23/2016   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[place]) 03/18/2016   ADHD (attention deficit hyperactivity disorder) 12/22/2014   Raynaud's phenomenon 10/17/2013   Hyperlipidemia 09/28/2011   Past Medical History:  Diagnosis Date   ADD (attention deficit disorder)    Allergy    Anemia    Anorexia nervosa with bulimia    Per [place] New Patient Packet   Anxiety    Asthma    Carpal tunnel syndrome    Per [place] New Patient Packet   Depression    Dry eyes    Per [place] New Patient Packet   Dry mouth    Per [place] New Patient Packet   Fibroids    Per [place] New Patient Packet   History of hip x-ray 2021   Per [place] New Patient Packet   History of Papanicolaou smear of cervix 2021   Per [place] New Patient Packet, Dr.Pickens   Hyperlipidemia    Ileitis    Lupus ([place])    PONV (postoperative nausea and vomiting)    mild nausea    Raynaud disease    Retinal tear    Per [place] New Patient Packet   Systemic lupus erythematosus ([place])    Family History  Problem Relation Age of Onset   Heart disease Mother 32       CAD/stenting   Hyperlipidemia Mother    Arthritis Mother        ostearthritis   Hypertension Mother    Kidney disease Mother    Irritable bowel syndrome Mother    Stroke Mother 85       CVA    Fibromyalgia Mother    Eczema Mother    Sjogren's syndrome Mother    Hyperlipidemia Father    Neuropathy Father    Allergies Father        shellfish   Prostatitis Father    Hyperlipidemia Sister    ADD / ADHD Sister    Bipolar disorder Sister    Depression Sister    Hypertension Brother    OCD Brother    Anxiety disorder Daughter    ADD / ADHD Daughter    Asthma Daughter    OCD Son    Colon cancer Neg Hx    Inflammatory bowel disease Neg Hx    Allergic rhinitis Neg Hx    Angioedema Neg Hx    Atopy Neg Hx    Immunodeficiency Neg Hx    Urticaria Neg Hx    Past Surgical History:  Procedure Laterality Date   CARPAL TUNNEL RELEASE Right    08/2019   CESAREAN SECTION  2003   CESAREAN SECTION  2005   Per [facility] New Patient Packet   COLONOSCOPY  2017   Dr.Jacobs   cyst drained Left 2023   knee   DENTAL SURGERY  2009   Fromed tooth and chronic infection   DILITATION & CURRETTAGE/HYSTROSCOPY WITH NOVASURE ABLATION N/A 06/03/2020   Procedure: DILATATION & CURETTAGE/HYSTEROSCOPY WITH NOVASURE ABLATION;  Surgeon: Aletha Halim, MD;  Location: [facility];  Service: Gynecology;  Laterality: N/A;   ENDOMETRIAL BIOPSY  04/20/2020       EYE SURGERY Bilateral    cryoretinopexy   Social History   Occupational History   Not on file  Tobacco Use   Smoking status: Former    Packs/day: 0.75    Years: 10.00    Total pack years: 7.50    Types: Cigarettes    Quit date: 07/05/2001    Years since quitting: 21.4    Passive exposure: Never   Smokeless tobacco: Never  Vaping Use   Vaping Use: Never used  Substance and Sexual Activity   Alcohol use: Yes    Comment: occ   Drug use: Never   Sexual activity: Yes    Birth control/protection: Condom

## 2022-11-25 ENCOUNTER — Ambulatory Visit: Payer: BC Managed Care – PPO | Admitting: Rehabilitative and Restorative Service Providers"

## 2022-11-25 ENCOUNTER — Encounter: Payer: Self-pay | Admitting: Rehabilitative and Restorative Service Providers"

## 2022-11-25 DIAGNOSIS — M79605 Pain in left leg: Secondary | ICD-10-CM | POA: Diagnosis not present

## 2022-11-25 DIAGNOSIS — R262 Difficulty in walking, not elsewhere classified: Secondary | ICD-10-CM

## 2022-11-25 DIAGNOSIS — M25511 Pain in right shoulder: Secondary | ICD-10-CM | POA: Diagnosis not present

## 2022-11-25 DIAGNOSIS — G8929 Other chronic pain: Secondary | ICD-10-CM

## 2022-11-25 DIAGNOSIS — M6281 Muscle weakness (generalized): Secondary | ICD-10-CM | POA: Diagnosis not present

## 2022-11-25 NOTE — Therapy (Signed)
OUTPATIENT PHYSICAL THERAPY TREATMENT NOTE   Patient Name: SETH BORRUSO MRN: [id number] DOB:Nov 11, 1970, 52 y.o., female Today's Date: 11/25/2022    END OF SESSION:   PT End of Session - 11/25/22 0806     Visit Number 15    Number of Visits 23    Date for PT Re-Evaluation 12/07/22    Authorization Type [employer] [id number] copay    Authorization - Number of Visits 19    PT Start Time 0800    PT Stop Time 0838    PT Time Calculation (min) 38 min    Activity Tolerance Patient tolerated treatment well    Behavior During Therapy WFL for tasks assessed/performed               Past Medical History:  Diagnosis Date   ADD (attention deficit disorder)    Allergy    Anemia    Anorexia nervosa with bulimia    Per [facility] Packet   Anxiety    Asthma    Carpal tunnel syndrome    Per [place] [facility] Patient Packet   Depression    Dry eyes    Per [place] [facility] Patient Packet   Dry mouth    Per [place] [facility] Patient Packet   Fibroids    Per [place] [facility] Patient Packet   History of hip x-ray 2021   Per [place] [facility] Patient Packet   History of Papanicolaou smear of cervix 2021   Per [place] [facility] Patient Packet, Dr.Pickens   Hyperlipidemia    Ileitis    Lupus ([place])    PONV (postoperative nausea and vomiting)    mild nausea    Raynaud disease    Retinal tear    Per [facility] New Patient Packet   Systemic lupus erythematosus ([place])    Past Surgical History:  Procedure Laterality Date   CARPAL TUNNEL RELEASE Right    08/2019   CESAREAN SECTION  2003   CESAREAN SECTION  2005   Per [facility] New Patient Packet   COLONOSCOPY  2017   Dr.Jacobs   cyst drained Left 2023   knee   DENTAL SURGERY  2009   Fromed tooth and chronic infection   DILITATION & CURRETTAGE/HYSTROSCOPY WITH NOVASURE ABLATION N/A 06/03/2020   Procedure: DILATATION & CURETTAGE/HYSTEROSCOPY WITH NOVASURE ABLATION;  Surgeon: Aletha Halim, MD;  Location: [place];  Service: Gynecology;  Laterality: N/A;   ENDOMETRIAL BIOPSY   04/20/2020       EYE SURGERY Bilateral    cryoretinopexy   Patient Active Problem List   Diagnosis Date Noted   Left hamstring injury 07/15/2022   Labral tear of shoulder, degenerative, right 06/21/2022   Left carpal tunnel syndrome 05/26/2022   Foot sprain, left, initial encounter 05/19/2022   Baker's cyst of knee, left 04/21/2022   Right carpal tunnel syndrome 03/16/2022   Flushing 04/14/2021   Hypermobility syndrome 11/27/2020   Seasonal and perennial allergic rhinitis 11/06/2018   Mild intermittent asthma without complication [id number]   Flexural atopic dermatitis 11/06/2018   History of hyperlipidemia 11/08/2016   History of anxiety 11/08/2016   History of retinal tear 11/08/2016   History of Bilateral plantar fasciitis 08/10/2016   High risk medication use 07/04/2016   Lupus ([place]) 05/23/2016   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[place]) 03/18/2016   ADHD (attention deficit hyperactivity disorder) 12/22/2014   Raynaud's phenomenon 10/17/2013   Hyperlipidemia 09/28/2011     THERAPY DIAG:  Chronic right shoulder pain  Pain in left leg  Muscle weakness (generalized)  Difficulty in walking, not elsewhere classified  PCP: Ngetich, Nelda Bucks NP   REFERRING PROVIDER: Leandrew Koyanagi, MD   REFERRING DIAG: [phone] (ICD-10-CM) - Left hamstring injury, initial encounter    Rationale for Evaluation and Treatment: Rehabilitation   ONSET DATE: Around 07/13/2022 for hamstring, several years for other complaints of Rt shoulder pain and back pain   SUBJECTIVE:    SUBJECTIVE STATEMENT: She indicated feeling sore all over and mentioned maybe allergy.  She mentioned Rt shoulder more specifically today into Rt upper arm and with some complaints into Rt cervical.  Mentioned she thought the steroid might have worn off.  She indicated wanting some taping for shoulder.   Indicated she felt the hamstring complaints have been doing better.    PERTINENT HISTORY: Lt hamstring tear grade 1. Bakers Cyst per  MD note.  Med history : lupus  NEW MRI Rt shoulder IMPRESSION: 1. Moderate tendinosis of the supraspinatus tendon with a partial-thickness bursal surface tear and a partial-thickness articular surface tear. 2. Mild tendinosis of the infraspinatus tendon. 3. Mild subacromial/subdeltoid bursitis.   PAIN:  Pain location:  Specifically mentioned Rt shoulder/upper arm Description:  tenderness, tightness, spasms Aggravating factors: Rt shoulder: reaching upward /outward Relieving factors: taping, stretching, previous treatment, injection helped   PRECAUTIONS: None   WEIGHT BEARING RESTRICTIONS: No   FALLS:  Has patient fallen in last 6 months? Yes 2x - Outside of house Pt indicated a fear of falling.     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  Heal hamstring completely     OBJECTIVE:     PATIENT SURVEYS:  11/08/2022:  FOTO update 51  08/16/2022: FOTO update 49  07/26/2022 FOTO intake:31 predicted:  49   COGNITION: 07/26/2022 Overall cognitive status: WFL                        SENSATION: 07/26/2022 WFL   MUSCLE LENGTH: 07/26/2022 Hamstrings passive SLR supine: Right 90 deg; Left 70 deg c pain   POSTURE:  07/26/2022 unremarkable in standing   PALPATION: 07/26/2022 Tenderness noted to light touch in Lt lumbar paraspinals   ROM:    ROM 07/26/2022 Right 07/26/2022 Left 07/26/2022 08/09/2022 Right 08/23/22  Shoulder flexion        WNL  Shoulder abduction        WNL  Shoulder internal rotation        WNL  Shoulder external rotation        WNL         Knee flexion          Knee extension                     Lumbar extension 75% WFL c end range back complaints   Repeated x 5  in standing c ERP worsened     75% WFL c tightness lower back (more on Lt)   Lumbar flexion To floor no complaints     Hands flat to floor               (Blank rows = not tested)   UPPER EXTREMITY ROM:  ROM Right eval Left eval   Shoulder flexion  WFL with painful  arc in sitting and supine   Shoulder extension     Shoulder abduction     Shoulder adduction     Shoulder extension     Shoulder internal rotation  60 AROM in 45 deg abduction   Shoulder external rotation  80 AROM in 45 deg abduction    Elbow flexion     Elbow extension     Wrist flexion          Lower trap     Middle trap                (Blank rows = not tested)    LOWER EXTREMITY MMT:   MMT Right 07/26/2022 Left 07/26/2022 Left 07/28/22 Left 08/09/2022 Left 1/31/204  Hip flexion 5/5 5/5     Hip extension Check next visit Check next visit 4/5    Hip abduction Check next visit Check next visit 4/5    Hip adduction         Hip internal rotation         Hip external rotation         Knee flexion 5/5   39, 36.4 lbs   3+/5 14 lbs With  pain   4/5 20.6, 18.4 lbs 4/5 26, 23 lbs  Knee extension 5/5 5/5     Ankle dorsiflexion 5/5 5/5     Ankle plantarflexion         Ankle inversion         Ankle eversion          (Blank rows = not tested)  UPPER EXTREMITY MMT:  MMT Right 08/23/22 Right 10/12/2021 Right 11/02/2022 Left 11/02/2022 Right 11/25/2022 Left 11/25/2022  Shoulder flexion 5 5/5 c pain   4+/5 c mild pain 5/5  Shoulder extension        Shoulder abduction 4 3+/5 c pain   4/5 c pain 5/5  Shoulder adduction        Shoulder extension        Shoulder internal rotation 4 5/5      Shoulder external rotation 4- 4/5 c pain      Middle trapezius   4/5 4/5 5/5 5/5  Lower trapezius   4/5 4/5 4+/5 c mild pain 5/5  Elbow flexion        Elbow extension        Wrist flexion        Wrist extension        Wrist ulnar deviation        Wrist radial deviation        Wrist pronation        Wrist  supination        Grip strength         (Blank rows = not tested)    SPECIAL TESTS:  07/26/2022 Lt slump test positive for complaints in thigh and calf, Negative on Rt Negative crossed slr bilateral for any radicular pain   FUNCTIONAL TESTS:  08/16/2022:  Lt SLS:  18 seconds  07/26/2022 18 inch chair transfer:  1st try without hands Lt SLS:  10 seconds, stopped due to back related complaints Rt SLS:  30 seconds c mild aberrant movement   GAIT: 07/26/2022 Independent ambulation      TODAY'S TREATMENT  11/25/2022: Therex UBE UE/ LE 8 mins fwd lvl 4.0 Cross arm stretch Rt 15 sec x 3 Rt upper trap stretch 15 sec x 3 Standing green band shoulder ER c towel under elbow 2 x 15 Standing  blue band bilateral GH ext x 20  Manual: Compression to Rt infraspinatus, Rt upper trap.  Ktaping to Rt shoulder (Vertical anterior and posterior semicircle strips from lateral upper arm to upper trap as well as strip horizontal from anterior to posterior shoulder across lateral upper arm. )   Trigger Point Dry-Needling  Treatment instructions: Expect mild to moderate muscle soreness. S/S of pneumothorax if dry needled over a lung field, and to seek immediate medical attention should they occur. Patient verbalized understanding of these instructions and education.  Patient Consent Given: Yes Education handout provided: Previously provided Muscles treated: Rt infraspinatus, Rt upper trap  Treatment response/outcome: local twitch response noted , no adverse reaction    11/11/2022:  Therex UBE UE/ LE 5 mins fwd lvl 5.0, 3 mins backward lvl 3.0 Knee flexion double leg up, Lt leg eccentric return 20 lbs 3 x 10   Lateral stepping green band c weighted ball 6.6 lbs squats to  reactive light touching 45 seconds x 4 c 15 second breaks Green tband ER c flexion punch 2 x 10 , performed bilaterally  Neuro Re-ed  SLS c reactive light touching 4 square blazepods 30 sec x 3 bilateral Lateral stepping  reactive to 3 blazepods 2 second time out 30 sec x 2 bilateral  11/08/2022: Manual: Compression applied and skilled palpation with DN  Trigger Point Dry-Needling  Treatment instructions: Expect mild to moderate muscle soreness. S/S of pneumothorax if dry needled over a lung field, and to seek immediate medical attention should they occur. Patient verbalized understanding of these instructions and education.  Patient Consent Given: Yes Education handout provided: Previously provided Muscles treated: Lt hamstrings, and gastroc Treatment response/outcome: twitch response c concordant symptoms.   Therex UBE LE Prone swimmers with lift over cones to go from y to t to ext x 10 bilateral Push up hold on knees with SA hold with contralateral arm touching to cones with reactive color call outs x 20 bilateral RDL x 15 bilateral with occasional hand assist Hip hike c hip abduction into doorframe review 5 sec hold x 10 bilateral Knee flexion double leg up, Lt leg eccentric return 15 lbs  x 10 , 20 lbs x 10   11/02/2022: Therex:  Prone lying chest on pball with knees on table y's with green band between hands 2 x 10 3 sec hold, t's with 1 lb 2 x 10 3 sec hold, gh ext 1 lb 2 x 10 c 3 sec hold  Standing rows and extensions green 2X10 Standing bilateral shoulder ER c scap retraction green x 15 Standing hip hike in doorframe with contralateral hip abduction into doorframe 5 sec hold x 10 bilateral Standing RDL x 10 bilateral  10/28/2022: Manual: Compression applied and skilled palpation with DN  Trigger Point Dry-Needling  Treatment instructions: Expect mild to moderate muscle soreness. S/S of pneumothorax if dry needled over a lung field, and to seek immediate medical attention should they occur. Patient verbalized understanding of these instructions and education.  Patient Consent Given: Yes Education handout provided: Previously provided Muscles treated: Rt infraspinatus,  supraspinatus,deltoids; Lt hamstrings, and gastroc Treatment response/outcome: twitch response c concordant symptoms.   Therex:  Prone Y,T,I X 10 each on Rt, holding 3 sec Standing rows and extensions green 2X10 Standing shoulder ER green 2X10 bilat Standing shoulder IR green 2X10 bilat Standing hip hikes  X10 bilat with UE support     PATIENT EDUCATION:   10/12/2022 Education details: HEP update Person educated: Patient Education  method: Explanation, Demonstration, Verbal cues, and Handouts Education comprehension: verbalized understanding, returned demonstration, and verbal cues required   HOME EXERCISE PROGRAM: Access Code: [email or web address] URL: [email or web address] Date: 10/26/2022 Prepared by: Elsie Ra  Exercises - Supine Bridge  - 1-2 x daily - 7 x weekly - 1-2 sets - 10 reps - 2 hold - Supine 90/90 Sciatic Nerve Glide with Knee Flexion/Extension  - 1-2 x daily - 7 x weekly - 1-2 sets - 10 reps - Sidelying Hip Abduction  - 1-2 x daily - 7 x weekly - 2-3 sets - 10-15 reps - Standing Hip Hiking  - 1-2 x daily - 7 x weekly - 1 sets - 10 reps - 5 hold - Seated Hamstring Set (Mirrored)  - 2-3 x daily - 7 x weekly - 1 sets - 10-15 reps - 5-10 hold - Prone Alternating Arm and Leg Lifts  - 1-2 x daily - 7 x weekly - 1-2 sets - 10 reps - 3 hold - Sidelying Lumbar Rotation Stretch (Mirrored)  - 2-3 x daily - 7 x weekly - 1 sets - 5 reps - 15 hold - Standing Isometric Shoulder Internal Rotation at Doorway  - 2 x daily - 6 x weekly - 1-2 sets - 10 reps - 5 hold - Standing Isometric Shoulder External Rotation with Doorway (Mirrored)  - 2 x daily - 6 x weekly - 1-2 sets - 10 reps - 5 hold - Standing Isometric Shoulder Flexion with Doorway - Arm Bent  - 2 x daily - 6 x weekly - 1-2 sets - 10 reps - 5 hold - Standing Isometric Shoulder Abduction with Doorway - Arm Bent (Mirrored)  - 2 x daily - 6 x weekly - 1-2 sets - 10 reps - 5 hold - Sidelying Shoulder External Rotation  (Mirrored)  - 1-2 x daily - 7 x weekly - 2-3 sets - 10-15 reps - Standing Shoulder Posterior Capsule Stretch (Mirrored)  - 2-3 x daily - 7 x weekly - 1 sets - 5 reps - 15-30 hold - Prone Single Arm Shoulder Y  - 2 x daily - 6 x weekly - 2-3 sets - 10 reps - 3 sec hold - Prone Shoulder Horizontal Abduction  - 2 x daily - 3-4 x weekly - 2-3 sets - 10 reps - 3 sec hold - Prone Shoulder Extension - Single Arm  - 2 x daily - 3-4 x weekly - 2-3 sets - 10 reps - 3 sec hold   ASSESSMENT:   CLINICAL IMPRESSION: Symptoms more focused from Rt upper arm region.  Retesting today showed Rt shoulder weakness with symptoms still noted compared to Lt.  Positive improvement within clinic session pre and post manual/needling per Pt report.  Continued focus on improving strength and movement coordination warranted at this time.   OBJECTIVE IMPAIRMENTS: decreased activity tolerance, decreased balance, decreased coordination, decreased endurance, decreased mobility, difficulty walking, decreased ROM, decreased strength, increased fascial restrictions, impaired perceived functional ability, increased muscle spasms, impaired flexibility, improper body mechanics, and pain.    ACTIVITY LIMITATIONS: carrying, lifting, bending, standing, squatting, stairs, transfers, and locomotion level   PARTICIPATION LIMITATIONS: cleaning, interpersonal relationship, community activity, occupation, yard work, and exercise routine, walking dog   PERSONAL FACTORS: Time since onset of injury/illness/exacerbation and multiple treatment areas, lupus  are also affecting patient's functional outcome.    REHAB POTENTIAL: Good   CLINICAL DECISION MAKING: Stable/uncomplicated   EVALUATION COMPLEXITY: Low     GOALS: Goals reviewed with patient? Yes  SHORT TERM GOALS: (target date for Short term goals are 3 weeks 08/16/2022)    1.  Patient will demonstrate independent use of home exercise program to maintain progress from in clinic  treatments.   Goal status: Met 08/16/2022   LONG TERM GOALS: (target dates for all long term goals are 10 weeks  12/07/2022 )   1. Patient will demonstrate/report pain at worst less than or equal to 2/10 to facilitate minimal limitation in daily activity secondary to pain symptoms.   Goal status: on going 11/02/2022   2. Patient will demonstrate independent use of home exercise program to facilitate ability to maintain/progress functional gains from skilled physical therapy services.   Goal status: on going 11/02/2022   3. Patient will demonstrate FOTO outcome > or = 58 % to indicate reduced disability due to condition.   Goal status:on going 11/02/2022   4.  Patient will demonstrate Lt LE MMT 5/5 , dynamometry within 15 % of Rt, Rt shoulder MMT 5/5 throughout to faciltiate usual transfers, stairs, squatting at [facility] for daily life.    Goal status: on going 11/02/2022   5.  Patient will demonstrate Lt passive hamstring SLR to 90 deg equal to Rt s symptoms for normal mobility.  Goal status: on going 11/02/2022   6.  Patient will demonstrate bilateral SLS > 30 seconds to facilitate stability in ambulation on even and uneven ground.  Goal status:on going 11/02/2022   7.  Patient will demonstrate lumbar AROM WFL s symptoms to facilitate usual mobility at PLOF in standing/walking.  Goal Status: on going 11/02/2022    PLAN:   PT FREQUENCY: 1-2x/week   PT DURATION: 8 weeks   PLANNED INTERVENTIONS: Therapeutic exercises, Therapeutic activity, Neuro Muscular re-education, Balance training, Gait training, Patient/Family education, Joint mobilization, Stair training, DME instructions, Dry Needling, Electrical stimulation, Traction, Cryotherapy, vasopneumatic deviceMoist heat, Taping, Ultrasound, Ionotophoresis 4mg /ml Dexamethasone, and Manual therapy.  All included unless contraindicated   PLAN FOR NEXT SESSION: Continued stability and strengthening as tolerated.  Dry needling as desired.     Scot Jun, PT, DPT, OCS, ATC 11/25/22  8:38 AM

## 2022-11-30 ENCOUNTER — Encounter: Payer: Self-pay | Admitting: Physician Assistant

## 2022-11-30 ENCOUNTER — Encounter: Payer: Self-pay | Admitting: Sports Medicine

## 2022-11-30 ENCOUNTER — Ambulatory Visit: Payer: BC Managed Care – PPO | Admitting: Sports Medicine

## 2022-11-30 ENCOUNTER — Ambulatory Visit: Payer: BC Managed Care – PPO | Attending: Rheumatology | Admitting: Physician Assistant

## 2022-11-30 VITALS — BP 124/84 | HR 73 | Resp 14 | Ht 66.0 in | Wt 160.0 lb

## 2022-11-30 DIAGNOSIS — Z9889 Other specified postprocedural states: Secondary | ICD-10-CM

## 2022-11-30 DIAGNOSIS — K5000 Crohn's disease of small intestine without complications: Secondary | ICD-10-CM

## 2022-11-30 DIAGNOSIS — S86112D Strain of other muscle(s) and tendon(s) of posterior muscle group at lower leg level, left leg, subsequent encounter: Secondary | ICD-10-CM | POA: Diagnosis not present

## 2022-11-30 DIAGNOSIS — Z79899 Other long term (current) drug therapy: Secondary | ICD-10-CM

## 2022-11-30 DIAGNOSIS — M7122 Synovial cyst of popliteal space [Baker], left knee: Secondary | ICD-10-CM | POA: Diagnosis not present

## 2022-11-30 DIAGNOSIS — M3219 Other organ or system involvement in systemic lupus erythematosus: Secondary | ICD-10-CM

## 2022-11-30 DIAGNOSIS — E559 Vitamin D deficiency, unspecified: Secondary | ICD-10-CM | POA: Diagnosis not present

## 2022-11-30 DIAGNOSIS — M25511 Pain in right shoulder: Secondary | ICD-10-CM

## 2022-11-30 DIAGNOSIS — M249 Joint derangement, unspecified: Secondary | ICD-10-CM

## 2022-11-30 DIAGNOSIS — L52 Erythema nodosum: Secondary | ICD-10-CM

## 2022-11-30 DIAGNOSIS — R5383 Other fatigue: Secondary | ICD-10-CM

## 2022-11-30 DIAGNOSIS — M19041 Primary osteoarthritis, right hand: Secondary | ICD-10-CM

## 2022-11-30 DIAGNOSIS — M357 Hypermobility syndrome: Secondary | ICD-10-CM

## 2022-11-30 DIAGNOSIS — M24111 Other articular cartilage disorders, right shoulder: Secondary | ICD-10-CM | POA: Diagnosis not present

## 2022-11-30 DIAGNOSIS — I7300 Raynaud's syndrome without gangrene: Secondary | ICD-10-CM | POA: Diagnosis not present

## 2022-11-30 DIAGNOSIS — Z8639 Personal history of other endocrine, nutritional and metabolic disease: Secondary | ICD-10-CM

## 2022-11-30 DIAGNOSIS — G8929 Other chronic pain: Secondary | ICD-10-CM

## 2022-11-30 DIAGNOSIS — M533 Sacrococcygeal disorders, not elsewhere classified: Secondary | ICD-10-CM

## 2022-11-30 DIAGNOSIS — M19072 Primary osteoarthritis, left ankle and foot: Secondary | ICD-10-CM

## 2022-11-30 DIAGNOSIS — M25562 Pain in left knee: Secondary | ICD-10-CM

## 2022-11-30 DIAGNOSIS — M19071 Primary osteoarthritis, right ankle and foot: Secondary | ICD-10-CM

## 2022-11-30 DIAGNOSIS — F900 Attention-deficit hyperactivity disorder, predominantly inattentive type: Secondary | ICD-10-CM

## 2022-11-30 DIAGNOSIS — M19042 Primary osteoarthritis, left hand: Secondary | ICD-10-CM

## 2022-11-30 DIAGNOSIS — Z8669 Personal history of other diseases of the nervous system and sense organs: Secondary | ICD-10-CM

## 2022-11-30 DIAGNOSIS — Z8659 Personal history of other mental and behavioral disorders: Secondary | ICD-10-CM

## 2022-11-30 MED ORDER — HYDROXYCHLOROQUINE SULFATE 200 MG PO TABS: 200.0000 mg | ORAL_TABLET | Freq: Two times a day (BID) | ORAL | 0 refills | Status: DC

## 2022-11-30 NOTE — Progress Notes (Signed)
Robin [name] - 52 y.o. female MRN [id number]  Date of birth: [birth year]  Office Visit Note: Visit Date: 11/30/2022 PCP: Sandrea Hughs, NP Referred by: Sandrea Hughs, NP  Subjective: Chief Complaint  Patient presents with   Left Leg - Pain, Follow-up   HPI: Robin [name] is a pleasant 52 y.o. female who presents today for follow-up of posterior knee pain, also with acute on chronic right shoulder pain.  Left posterior knee -she has found good improvement with the extracorporeal shockwave therapy.  She continue with formalized physical therapy as well.  Has not noticed reaccumulation of the Baker's cyst as much as in the past.  Has had increased mobility with the shockwave treatments.  Acute on chronic right shoulder pain -she has a chronic right shoulder pain which she has undergone therapy for.  We did proceed with an US-guided injection into right shoulder on 10/14/22 after evaluation by Dr. Erlinda Hong.  She states that for 1 month she got excellent relief of her pain, was essentially nothing.  Here over the last week or so the pain has been exacerbated and she gets pain with any movement about the shoulder.  She did have 1 prior intra-articular glenohumeral joint injection in the past which gave her about 1 month relief again before her pain came back.  To her knowledge, has not had a subacromial injection.  Pertinent ROS were reviewed with the patient and found to be negative unless otherwise specified above in HPI.   Assessment & Plan: Visit Diagnoses:  1. Strain of gastrocnemius muscle of left lower extremity, subsequent encounter   2. Hypermobility syndrome   3. Baker's cyst of knee, left   4. Labral tear of shoulder, degenerative, right   5. Chronic right shoulder pain    Plan: Discussed with Junie we are making good progress with her posterior knee pain and preventing reaccumulation of her Baker's cyst.  She will continue extracorporeal shockwave therapies for this.  Will  continue her formalized physical therapy.  She does have her body helix compression sleeve she may wear as needed.  In terms of her shoulder, she does have degenerative changes about the labrum, but her recent MRI also shows some supraspinatus tearing.  I think that shoulder pain is multifactorial and likely stemming from her hypermobility syndrome.  She has gotten excellent relief from glenohumeral joint injections, although her pain relief only lasts for about 1 month, I am hesitant to keep repeating intra-articular steroids for the labrum itself.  At this point I think it would be good for her to see what Dr. Erlinda Hong to or Dr. Marlou Sa thanks for the next best steps for the shoulder.  Did discuss that possibly a consideration of a subacromial joint injection could be ascertained.  Would like to know if Dr. Erlinda Hong or Dr. Marlou Sa thinks the supraspinatus there is something that would warrant any surgical evaluation, although I personally would be hesitant given her hypermobility. Appreciate their recs.  Continue Home rehab for the shoulder and PT.  Follow-up: Return in about 1 week (around 12/07/2022) for as desired for posterior knee pain left.   Meds & Orders: No orders of the defined types were placed in this encounter.  No orders of the defined types were placed in this encounter.    Procedures: Procedure: ECSWT Indications: Gastrocnemius tendinopathic changes   Procedure Details Consent: Risks of procedure as well as the alternatives and risks of each were explained to the patient.  Verbal consent for  procedure obtained. Time Out: Verified patient identification, verified procedure, site was marked, verified correct patient position. The area was cleaned with alcohol swab.     The left proximal gastroc, distal hamstring, and posterior knee was targeted for Extracorporeal shockwave therapy.    Preset: Status post muscular injury Power Level: 120 mJ  Frequency: 12 Hz Impulse/cycles: 3200 Head size:  Regular   Patient tolerated procedure well without immediate complications      Clinical History: No specialty comments available.  She reports that she quit smoking about 21 years ago. Her smoking use included cigarettes. She has a 7.50 pack-year smoking history. She has never been exposed to tobacco smoke. She has never used smokeless tobacco. No results for input(s): "HGBA1C", "LABURIC" in the last 8760 hours.  Objective:    Physical Exam  Gen: Well-appearing, in no acute distress; non-toxic CV: Well-perfused. Warm.  Resp: Breathing unlabored on room air; no wheezing. Psych: Fluid speech in conversation; appropriate affect; normal thought process Neuro: Sensation intact throughout. No gross coordination deficits.   Ortho Exam - Right shoulder: There is pain palpating deep within the anterior glenohumeral joint.  No AC joint TTP.  She has full range of motion in all directions, actually is more hyper lax about the shoulder with some instability to anterior/posterior translation.  Positive Hawkins impingement, positive O'Brien's testing.  There is also pain with resisted abduction and ER.  - Left knee: Very small palpation of the possible Baker's cyst in the popliteal fossa, certainly improved from prior visits.  Full range of motion about the knee in all directions.  No redness or ecchymosis.  Imaging:  MRI Right shoulder 08/09/22:   *Independent review and interpretation of the right shoulder MRI was performed by myself today.  There is some irregularity with partial thickness cartilage loss of the glenohumeral joint.  I do see both tendinopathy as well as partial-thickness tearing of the insertion of the supraspinatus tendon.  There is a small degree of subacromial/subdeltoid bursitis.  Narrative & Impression  CLINICAL DATA:  Right shoulder pain for 5 years.   EXAM: MRI OF THE RIGHT SHOULDER WITH CONTRAST   TECHNIQUE: Multiplanar, multisequence MR imaging of the right shoulder  shoulder was performed following the administration of intra-articular contrast.   CONTRAST:  See Injection Documentation.   COMPARISON:  None Available.   FINDINGS: Rotator cuff: Moderate tendinosis of the supraspinatus tendon with a partial-thickness bursal surface tear and a partial-thickness articular surface tear. Mild tendinosis of the infraspinatus tendon. Teres minor tendon is intact. Subscapularis tendon is intact.   Muscles: No muscle atrophy or edema. No intramuscular fluid collection or hematoma.   Biceps Long Head: Intraarticular and extraarticular portions of the biceps tendon are intact.   Acromioclavicular Joint: No significant arthropathy of the acromioclavicular joint. Small amount of subacromial/subdeltoid bursal fluid.   Glenohumeral Joint: Intraarticular contrast distending the joint capsule. Normal glenohumeral ligaments. Mild partial-thickness cartilage loss of the glenohumeral joint.   Labrum: Intact.   Bones: No fracture or dislocation. No aggressive osseous lesion.   Other: No fluid collection or hematoma.   IMPRESSION: 1. Moderate tendinosis of the supraspinatus tendon with a partial-thickness bursal surface tear and a partial-thickness articular surface tear. 2. Mild tendinosis of the infraspinatus tendon. 3. Mild subacromial/subdeltoid bursitis.     Electronically Signed   By: Kathreen Devoid M.D.   On: 08/10/2022 06:33    [place] Extrem Low Left Ltd Limited musculoskeletal ultrasound of the left knee was performed today.   Evaluation of  the popliteal fossa shows appropriate neurovasculature  without abnormality.  There is a large hypoechoic fluid collection with  multiple septations within the knee and medial head of the gastrocnemius  tendon.  Hypoechoic fluid collection is 5.04 x 2.52 cm in nature.  There  is notable hyperemia surrounding this collection.  Evaluation of the  anterior knee shows no cortical defect of the superior patella.   Quadricep  tendon intact in long and short axis without evidence of tearing or  tendinopathy.  There is no suprapatellar joint effusion.  Technically successful ultrasound-guided Baker's cyst aspiration.    Past Medical/Family/Surgical/Social History: Medications & Allergies reviewed per EMR, new medications updated. Patient Active Problem List   Diagnosis Date Noted   Left hamstring injury 07/15/2022   Labral tear of shoulder, degenerative, right 06/21/2022   Left carpal tunnel syndrome 05/26/2022   Foot sprain, left, initial encounter 05/19/2022   Baker's cyst of knee, left 04/21/2022   Right carpal tunnel syndrome 03/16/2022   Flushing 04/14/2021   Hypermobility syndrome 11/27/2020   Seasonal and perennial allergic rhinitis 11/06/2018   Mild intermittent asthma without complication [id number]   Flexural atopic dermatitis 11/06/2018   History of hyperlipidemia 11/08/2016   History of anxiety 11/08/2016   History of retinal tear 11/08/2016   History of Bilateral plantar fasciitis 08/10/2016   High risk medication use 07/04/2016   Lupus ([place]) 05/23/2016   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[place]) 03/18/2016   ADHD (attention deficit hyperactivity disorder) 12/22/2014   Raynaud's phenomenon 10/17/2013   Hyperlipidemia 09/28/2011   Past Medical History:  Diagnosis Date   ADD (attention deficit disorder)    Allergy    Anemia    Anorexia nervosa with bulimia    Per [place] [facility] Patient Packet   Anxiety    Asthma    Carpal tunnel syndrome    Per [place] [facility] Packet   Depression    Dry eyes    Per [place] [facility] Patient Packet   Dry mouth    Per [place] New Patient Packet   Fibroids    Per [place] [facility] Patient Packet   History of hip x-ray 2021   Per [place] [facility] Patient Packet   History of Papanicolaou smear of cervix 2021   Per [place] New Patient Packet, Dr.Pickens   Hyperlipidemia    Ileitis    Lupus ([place])    PONV (postoperative nausea and vomiting)    mild nausea    Raynaud disease    Retinal tear     Per [place] [facility] Patient Packet   Systemic lupus erythematosus ([place])    Family History  Problem Relation Age of Onset   Heart disease Mother 5       CAD/stenting   Hyperlipidemia Mother    Arthritis Mother        ostearthritis   Hypertension Mother    Kidney disease Mother    Irritable bowel syndrome Mother    Stroke Mother 1       CVA   Fibromyalgia Mother    Eczema Mother    Sjogren's syndrome Mother    Hyperlipidemia Father    Neuropathy Father    Allergies Father        shellfish   Prostatitis Father    Hyperlipidemia Sister    ADD / ADHD Sister    Bipolar disorder Sister    Depression Sister    Hypertension Brother    OCD Brother    Anxiety disorder Daughter    ADD / ADHD Daughter  Asthma Daughter    OCD Son    Colon cancer Neg Hx    Inflammatory bowel disease Neg Hx    Allergic rhinitis Neg Hx    Angioedema Neg Hx    Atopy Neg Hx    Immunodeficiency Neg Hx    Urticaria Neg Hx    Past Surgical History:  Procedure Laterality Date   CARPAL TUNNEL RELEASE Right    08/2019   CESAREAN SECTION  2003   CESAREAN SECTION  2005   Per [facility] New Patient Packet   COLONOSCOPY  2017   Dr.Jacobs   cyst drained Left 2023   knee   DENTAL SURGERY  2009   Fromed tooth and chronic infection   DILITATION & CURRETTAGE/HYSTROSCOPY WITH NOVASURE ABLATION N/A 06/03/2020   Procedure: DILATATION & CURETTAGE/HYSTEROSCOPY WITH NOVASURE ABLATION;  Surgeon: Aletha Halim, MD;  Location: [facility];  Service: Gynecology;  Laterality: N/A;   ENDOMETRIAL BIOPSY  04/20/2020       EYE SURGERY Bilateral    cryoretinopexy   Social History   Occupational History   Not on file  Tobacco Use   Smoking status: Former    Packs/day: 0.75    Years: 10.00    Additional pack years: 0.00    Total pack years: 7.50    Types: Cigarettes    Quit date: 07/05/2001    Years since quitting: 21.4    Passive exposure: Never   Smokeless tobacco: Never  Vaping Use   Vaping Use:  Never used  Substance and Sexual Activity   Alcohol use: Yes    Comment: occ   Drug use: Never   Sexual activity: Yes    Birth control/protection: Condom

## 2022-11-30 NOTE — Progress Notes (Signed)
Doing better; states the shockwave is helping Feels like the leg/knee is less puffy

## 2022-12-01 ENCOUNTER — Telehealth: Payer: Self-pay

## 2022-12-01 NOTE — Progress Notes (Signed)
No proteinuria.  CBC and CMP WNL. ESR WNL. Complements WNL. Vitamin D WNL. Continue on current maintenance dose of vitamin D.

## 2022-12-01 NOTE — Telephone Encounter (Signed)
Received fax from [place] - DOB verified - needed clarification on medication.  Paper work given to provider.

## 2022-12-02 ENCOUNTER — Encounter: Payer: BC Managed Care – PPO | Admitting: Physical Medicine & Rehabilitation

## 2022-12-02 ENCOUNTER — Encounter: Payer: BC Managed Care – PPO | Admitting: Rehabilitative and Restorative Service Providers"

## 2022-12-02 ENCOUNTER — Telehealth: Payer: Self-pay | Admitting: Rehabilitative and Restorative Service Providers"

## 2022-12-02 LAB — CBC WITH DIFFERENTIAL/PLATELET
Absolute Monocytes: 451 cells/uL (ref 200–950)
Basophils Absolute: 18 cells/uL (ref 0–200)
Basophils Relative: 0.4 %
Eosinophils Absolute: 189 cells/uL (ref 15–500)
Eosinophils Relative: 4.1 %
HCT: 41.5 % (ref 35.0–45.0)
Hemoglobin: 13.5 g/dL (ref 11.7–15.5)
Lymphs Abs: 1794 cells/uL (ref 850–3900)
MCH: 30.5 pg (ref 27.0–33.0)
MCHC: 32.5 g/dL (ref 32.0–36.0)
MCV: 93.9 fL (ref 80.0–100.0)
MPV: 10 fL (ref 7.5–12.5)
Monocytes Relative: 9.8 %
Neutro Abs: 2148 cells/uL (ref 1500–7800)
Neutrophils Relative %: 46.7 %
Platelets: 273 10*3/uL (ref 140–400)
RBC: 4.42 10*6/uL (ref 3.80–5.10)
RDW: 12.5 % (ref 11.0–15.0)
Total Lymphocyte: 39 %
WBC: 4.6 10*3/uL (ref 3.8–10.8)

## 2022-12-02 LAB — COMPLETE METABOLIC PANEL WITH GFR
AG Ratio: 2.2 (calc) (ref 1.0–2.5)
ALT: 22 U/L (ref 6–29)
AST: 26 U/L (ref 10–35)
Albumin: 5 g/dL (ref 3.6–5.1)
Alkaline phosphatase (APISO): 34 U/L (ref 31–125)
BUN: 15 mg/dL (ref 7–25)
CO2: 29 mmol/L (ref 20–32)
Calcium: 9.9 mg/dL (ref 8.6–10.2)
Chloride: 104 mmol/L (ref 98–110)
Creat: 0.86 mg/dL (ref 0.50–0.99)
Globulin: 2.3 g/dL (calc) (ref 1.9–3.7)
Glucose, Bld: 94 mg/dL (ref 65–99)
Potassium: 4.4 mmol/L (ref 3.5–5.3)
Sodium: 141 mmol/L (ref 135–146)
Total Bilirubin: 0.4 mg/dL (ref 0.2–1.2)
Total Protein: 7.3 g/dL (ref 6.1–8.1)
eGFR: 83 mL/min/{1.73_m2} (ref >=60)

## 2022-12-02 LAB — ANTI-NUCLEAR AB-TITER (ANA TITER): ANA Titer 1: 1:80 {titer} — ABNORMAL HIGH

## 2022-12-02 LAB — PROTEIN / CREATININE RATIO, URINE
Creatinine, Urine: 15 mg/dL — ABNORMAL LOW (ref 20–275)
Total Protein, Urine: <4 mg/dL — ABNORMAL LOW (ref 5–24)

## 2022-12-02 LAB — VITAMIN D 25 HYDROXY (VIT D DEFICIENCY, FRACTURES): Vit D, 25-Hydroxy: 66 ng/mL (ref 30–100)

## 2022-12-02 LAB — ANA: Anti Nuclear Antibody (ANA): POSITIVE — AB

## 2022-12-02 LAB — C3 AND C4
C3 Complement: 96 mg/dL (ref 83–193)
C4 Complement: 30 mg/dL (ref 15–57)

## 2022-12-02 LAB — SEDIMENTATION RATE: Sed Rate: 6 mm/h (ref 0–20)

## 2022-12-02 LAB — ANTI-DNA ANTIBODY, DOUBLE-STRANDED: ds DNA Ab: <1 IU/mL

## 2022-12-02 NOTE — Telephone Encounter (Signed)
Called patient after 15 mins no show for appointment today.  Left message and reminder of next appointment time, as well as call back number.    Scot Jun, PT, DPT, OCS, ATC 12/02/22  8:19 AM

## 2022-12-05 ENCOUNTER — Encounter: Payer: Self-pay | Admitting: Orthopaedic Surgery

## 2022-12-05 NOTE — Progress Notes (Signed)
ANA remains positive.  dsDNA is negative.  Labs are not consistent with a flare.  No medication changes recommended at this time.

## 2022-12-06 ENCOUNTER — Encounter: Payer: Self-pay | Admitting: Physical Therapy

## 2022-12-06 ENCOUNTER — Ambulatory Visit: Payer: BC Managed Care – PPO | Admitting: Physical Therapy

## 2022-12-06 DIAGNOSIS — R262 Difficulty in walking, not elsewhere classified: Secondary | ICD-10-CM | POA: Diagnosis not present

## 2022-12-06 DIAGNOSIS — F339 Major depressive disorder, recurrent, unspecified: Secondary | ICD-10-CM | POA: Diagnosis not present

## 2022-12-06 DIAGNOSIS — M25511 Pain in right shoulder: Secondary | ICD-10-CM | POA: Diagnosis not present

## 2022-12-06 DIAGNOSIS — M6281 Muscle weakness (generalized): Secondary | ICD-10-CM | POA: Diagnosis not present

## 2022-12-06 DIAGNOSIS — M79605 Pain in left leg: Secondary | ICD-10-CM | POA: Diagnosis not present

## 2022-12-06 DIAGNOSIS — F411 Generalized anxiety disorder: Secondary | ICD-10-CM | POA: Diagnosis not present

## 2022-12-06 DIAGNOSIS — G8929 Other chronic pain: Secondary | ICD-10-CM

## 2022-12-06 DIAGNOSIS — F902 Attention-deficit hyperactivity disorder, combined type: Secondary | ICD-10-CM | POA: Diagnosis not present

## 2022-12-06 NOTE — Therapy (Signed)
OUTPATIENT PHYSICAL THERAPY TREATMENT NOTE   Patient Name: Robin [name] MRN: [id number] DOB:17-Sep-1970, 52 y.o., female Today's Date: 12/06/2022    END OF SESSION:   PT End of Session - 12/06/22 0806     Visit Number 16    Number of Visits 23    Date for PT Re-Evaluation 12/07/22    Authorization Type [employer] [id number] copay    Authorization - Number of Visits 19    PT Start Time 0805    PT Stop Time 0845    PT Time Calculation (min) 40 min    Activity Tolerance Patient tolerated treatment well    Behavior During Therapy WFL for tasks assessed/performed               Past Medical History:  Diagnosis Date   ADD (attention deficit disorder)    Allergy    Anemia    Anorexia nervosa with bulimia    Per [place] [facility] Packet   Anxiety    Asthma    Carpal tunnel syndrome    Per [place] [facility]   Depression    Dry eyes    Per [employer] [facility]   Dry mouth    Per [place] [facility] Packet   Fibroids    Per [place] [facility]   History of hip x-ray 2021   Per [place] [facility]   History of Papanicolaou smear of cervix 2021   Per [place] [facility] Packet, Dr.Pickens   Hyperlipidemia    Ileitis    Lupus ([place])    PONV (postoperative nausea and vomiting)    mild nausea    Raynaud disease    Retinal tear    Per [facility] New Patient Packet   Systemic lupus erythematosus ([place])    Past Surgical History:  Procedure Laterality Date   CARPAL TUNNEL RELEASE Right    08/2019   CESAREAN SECTION  2003   CESAREAN SECTION  2005   Per [facility] New Patient Packet   COLONOSCOPY  2017   Dr.Jacobs   cyst drained Left 2023   knee   DENTAL SURGERY  2009   Fromed tooth and chronic infection   DILITATION & CURRETTAGE/HYSTROSCOPY WITH NOVASURE ABLATION N/A 06/03/2020   Procedure: DILATATION & CURETTAGE/HYSTEROSCOPY WITH NOVASURE ABLATION;  Surgeon: Aletha Halim, MD;  Location: [facility];  Service: Gynecology;  Laterality: N/A;   ENDOMETRIAL BIOPSY   04/20/2020       EYE SURGERY Bilateral    cryoretinopexy   Patient Active Problem List   Diagnosis Date Noted   Left hamstring injury 07/15/2022   Labral tear of shoulder, degenerative, right 06/21/2022   Left carpal tunnel syndrome 05/26/2022   Foot sprain, left, initial encounter 05/19/2022   Baker's cyst of knee, left 04/21/2022   Right carpal tunnel syndrome 03/16/2022   Flushing 04/14/2021   Hypermobility syndrome 11/27/2020   Seasonal and perennial allergic rhinitis 11/06/2018   Mild intermittent asthma without complication [id number]   Flexural atopic dermatitis 11/06/2018   History of hyperlipidemia 11/08/2016   History of anxiety 11/08/2016   History of retinal tear 11/08/2016   History of Bilateral plantar fasciitis 08/10/2016   High risk medication use 07/04/2016   Lupus ([place]) 05/23/2016   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[place]) 03/18/2016   ADHD (attention deficit hyperactivity disorder) 12/22/2014   Raynaud's phenomenon 10/17/2013   Hyperlipidemia 09/28/2011     THERAPY DIAG:  Chronic right shoulder pain  Pain in left leg  Muscle weakness (generalized)  Difficulty in walking, not elsewhere classified  PCP: Ngetich, Nelda Bucks NP   REFERRING PROVIDER: Leandrew Koyanagi, MD   REFERRING DIAG: [phone] (ICD-10-CM) - Left hamstring injury, initial encounter    Rationale for Evaluation and Treatment: Rehabilitation   ONSET DATE: Around 07/13/2022 for hamstring, several years for other complaints of Rt shoulder pain and back pain   SUBJECTIVE:    SUBJECTIVE STATEMENT: She relays her hamstring is doing great but her Rt shoulder is bad.    PERTINENT HISTORY: Lt hamstring tear grade 1. Bakers Cyst per MD note.  Med history : lupus  NEW MRI Rt shoulder IMPRESSION: 1. Moderate tendinosis of the supraspinatus tendon with a partial-thickness bursal surface tear and a partial-thickness articular surface tear. 2. Mild tendinosis of the infraspinatus tendon. 3. Mild  subacromial/subdeltoid bursitis.   PAIN:  Pain location:  7/10 Rt shoulder Description:  tenderness, tightness, spasms Aggravating factors: Rt shoulder: reaching upward /outward Relieving factors: taping, stretching, previous treatment, injection helped   PRECAUTIONS: None   WEIGHT BEARING RESTRICTIONS: No   FALLS:  Has patient fallen in last 6 months? Yes 2x - Outside of house Pt indicated a fear of falling.     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  Heal hamstring completely     OBJECTIVE:     PATIENT SURVEYS:  11/08/2022:  FOTO update 51  08/16/2022: FOTO update 49  07/26/2022 FOTO intake:31 predicted:  63   COGNITION: 07/26/2022 Overall cognitive status: WFL                        SENSATION: 07/26/2022 WFL   MUSCLE LENGTH: 07/26/2022 Hamstrings passive SLR supine: Right 90 deg; Left 70 deg c pain   POSTURE:  07/26/2022 unremarkable in standing   PALPATION: 07/26/2022 Tenderness noted to light touch in Lt lumbar paraspinals   ROM:    ROM 07/26/2022 Right 07/26/2022 Left 07/26/2022 08/09/2022 Right 08/23/22  Shoulder flexion        WNL  Shoulder abduction        WNL  Shoulder internal rotation        WNL  Shoulder external rotation        WNL         Knee flexion          Knee extension                     Lumbar extension 75% WFL c end range back complaints   Repeated x 5 in standing c ERP worsened     75% WFL c tightness lower back (more on Lt)   Lumbar flexion To floor no complaints     Hands flat to floor               (Blank rows = not tested)   UPPER EXTREMITY ROM:  ROM Right eval Left eval   Shoulder flexion  WFL with painful arc in sitting and  supine   Shoulder extension     Shoulder abduction     Shoulder adduction     Shoulder extension     Shoulder internal rotation  60 AROM in 45 deg abduction   Shoulder external rotation  80  AROM in 45 deg abduction    Elbow flexion     Elbow extension     Wrist flexion          Lower trap     Middle trap                (Blank rows = not tested)    LOWER EXTREMITY MMT:   MMT Right 07/26/2022 Left 07/26/2022 Left 07/28/22 Left 08/09/2022 Left 1/31/204  Hip flexion 5/5 5/5     Hip extension Check next visit Check next visit 4/5    Hip abduction Check next visit Check next visit 4/5    Hip adduction         Hip internal rotation         Hip external rotation         Knee flexion 5/5   39, 36.4 lbs   3+/5 14 lbs With  pain   4/5 20.6, 18.4 lbs 4/5 26, 23 lbs  Knee extension 5/5 5/5     Ankle dorsiflexion 5/5 5/5     Ankle plantarflexion         Ankle inversion         Ankle eversion          (Blank rows = not tested)  UPPER EXTREMITY MMT:  MMT Right 08/23/22 Right 10/12/2021 Right 11/02/2022 Left 11/02/2022 Right 11/25/2022 Left 11/25/2022  Shoulder flexion 5 5/5 c pain   4+/5 c mild pain 5/5  Shoulder extension        Shoulder abduction 4 3+/5 c pain   4/5 c pain 5/5  Shoulder adduction        Shoulder extension        Shoulder internal rotation 4 5/5      Shoulder external rotation 4- 4/5 c pain      Middle trapezius   4/5 4/5 5/5 5/5  Lower trapezius   4/5 4/5 4+/5 c mild pain 5/5  Elbow flexion        Elbow extension        Wrist flexion        Wrist extension        Wrist ulnar deviation        Wrist radial deviation        Wrist pronation        Wrist supination        Grip strength         (Blank rows = not tested)    SPECIAL TESTS:  07/26/2022 Lt slump test positive for complaints in thigh and calf, Negative on Rt Negative crossed slr bilateral for any radicular pain   FUNCTIONAL TESTS:  08/16/2022:  Lt SLS:  18  seconds  07/26/2022 18 inch chair transfer:  1st try without hands Lt SLS:  10 seconds, stopped due to back related complaints Rt SLS:  30 seconds c mild aberrant movement   GAIT: 07/26/2022 Independent ambulation      TODAY'S TREATMENT  12/06/2022: Therex UBE UE/ LE 8 mins fwd lvl 4.0 Rt upper limb neural tension glides X 10 each for median, radial, ulnar Standing green band shoulder ER and IR c towel under elbow X 20 each Standing blue band bilateral GH ext and rows x 20 each  Manual: Compression to Rt shoulder and skilled palpation with DN   Trigger Point Dry-Needling  Treatment instructions: Expect mild to moderate muscle soreness. S/S of pneumothorax if dry needled over a lung field, and to seek immediate  medical attention should they occur. Patient verbalized understanding of these instructions and education.  Patient Consent Given: Yes Education handout provided: Previously provided Muscles treated: Rt infraspinatus, Rt upper trap, Rt deltoid Treatment response/outcome: local twitch response noted , no adverse reaction  11/25/2022: Therex UBE UE/ LE 8 mins fwd lvl 4.0 Cross arm stretch Rt 15 sec x 3 Rt upper trap stretch 15 sec x 3 Standing green band shoulder ER c towel under elbow 2 x 15 Standing blue band bilateral GH ext x 20  Manual: Compression to Rt infraspinatus, Rt upper trap.  Ktaping to Rt shoulder (Vertical anterior and posterior semicircle strips from lateral upper arm to upper trap as well as strip horizontal from anterior to posterior shoulder across lateral upper arm. )   Trigger Point Dry-Needling  Treatment instructions: Expect mild to moderate muscle soreness. S/S of pneumothorax if dry needled over a lung field, and to seek immediate medical attention should they occur. Patient verbalized understanding of these instructions and education.  Patient Consent Given: Yes Education handout provided: Previously provided Muscles treated: Rt  infraspinatus, Rt upper trap  Treatment response/outcome: local twitch response noted , no adverse reaction     PATIENT EDUCATION:   10/12/2022 Education details: HEP update Person educated: Patient Education method: Consulting civil engineer, Demonstration, Verbal cues, and Handouts Education comprehension: verbalized understanding, returned demonstration, and verbal cues required   HOME EXERCISE PROGRAM: Access Code: [email or web address] URL: [email or web address] Date: 10/26/2022 Prepared by: Elsie Ra  Exercises - Supine Bridge  - 1-2 x daily - 7 x weekly - 1-2 sets - 10 reps - 2 hold - Supine 90/90 Sciatic Nerve Glide with Knee Flexion/Extension  - 1-2 x daily - 7 x weekly - 1-2 sets - 10 reps - Sidelying Hip Abduction  - 1-2 x daily - 7 x weekly - 2-3 sets - 10-15 reps - Standing Hip Hiking  - 1-2 x daily - 7 x weekly - 1 sets - 10 reps - 5 hold - Seated Hamstring Set (Mirrored)  - 2-3 x daily - 7 x weekly - 1 sets - 10-15 reps - 5-10 hold - Prone Alternating Arm and Leg Lifts  - 1-2 x daily - 7 x weekly - 1-2 sets - 10 reps - 3 hold - Sidelying Lumbar Rotation Stretch (Mirrored)  - 2-3 x daily - 7 x weekly - 1 sets - 5 reps - 15 hold - Standing Isometric Shoulder Internal Rotation at Doorway  - 2 x daily - 6 x weekly - 1-2 sets - 10 reps - 5 hold - Standing Isometric Shoulder External Rotation with Doorway (Mirrored)  - 2 x daily - 6 x weekly - 1-2 sets - 10 reps - 5 hold - Standing Isometric Shoulder Flexion with Doorway - Arm Bent  - 2 x daily - 6 x weekly - 1-2 sets - 10 reps - 5 hold - Standing Isometric Shoulder Abduction with Doorway - Arm Bent (Mirrored)  - 2 x daily - 6 x weekly - 1-2 sets - 10 reps - 5 hold - Sidelying Shoulder External Rotation (Mirrored)  - 1-2 x daily - 7 x weekly - 2-3 sets - 10-15 reps - Standing Shoulder Posterior Capsule Stretch (Mirrored)  - 2-3 x daily - 7 x weekly - 1 sets - 5 reps - 15-30 hold - Prone Single Arm Shoulder Y  - 2 x daily - 6 x weekly -  2-3 sets - 10 reps - 3 sec hold - Prone Shoulder Horizontal Abduction  -  2 x daily - 3-4 x weekly - 2-3 sets - 10 reps - 3 sec hold - Prone Shoulder Extension - Single Arm  - 2 x daily - 3-4 x weekly - 2-3 sets - 10 reps - 3 sec hold   ASSESSMENT:   CLINICAL IMPRESSION: She continues to have pain in Rt shoulder with symptoms sounding more neural with burning, numbness, tingling, weakness in her Rt shoulder. She continues to follow up with MD about this. I did give her upper limb neural tension glides to try to see if this helps any.  OBJECTIVE IMPAIRMENTS: decreased activity tolerance, decreased balance, decreased coordination, decreased endurance, decreased mobility, difficulty walking, decreased ROM, decreased strength, increased fascial restrictions, impaired perceived functional ability, increased muscle spasms, impaired flexibility, improper body mechanics, and pain.    ACTIVITY LIMITATIONS: carrying, lifting, bending, standing, squatting, stairs, transfers, and locomotion level   PARTICIPATION LIMITATIONS: cleaning, interpersonal relationship, community activity, occupation, yard work, and exercise routine, walking dog   PERSONAL FACTORS: Time since onset of injury/illness/exacerbation and multiple treatment areas, lupus  are also affecting patient's functional outcome.    REHAB POTENTIAL: Good   CLINICAL DECISION MAKING: Stable/uncomplicated   EVALUATION COMPLEXITY: Low     GOALS: Goals reviewed with patient? Yes   SHORT TERM GOALS: (target date for Short term goals are 3 weeks 08/16/2022)    1.  Patient will demonstrate independent use of home exercise program to maintain progress from in clinic treatments.   Goal status: Met 08/16/2022   LONG TERM GOALS: (target dates for all long term goals are 10 weeks  12/07/2022 )   1. Patient will demonstrate/report pain at worst less than or equal to 2/10 to facilitate minimal limitation in daily activity secondary to pain symptoms.    Goal status: on going 11/02/2022   2. Patient will demonstrate independent use of home exercise program to facilitate ability to maintain/progress functional gains from skilled physical therapy services.   Goal status: on going 11/02/2022   3. Patient will demonstrate FOTO outcome > or = 58 % to indicate reduced disability due to condition.   Goal status:on going 11/02/2022   4.  Patient will demonstrate Lt LE MMT 5/5 , dynamometry within 15 % of Rt, Rt shoulder MMT 5/5 throughout to faciltiate usual transfers, stairs, squatting at [facility] for daily life.    Goal status: on going 11/02/2022   5.  Patient will demonstrate Lt passive hamstring SLR to 90 deg equal to Rt s symptoms for normal mobility.  Goal status: on going 11/02/2022   6.  Patient will demonstrate bilateral SLS > 30 seconds to facilitate stability in ambulation on even and uneven ground.  Goal status:on going 11/02/2022   7.  Patient will demonstrate lumbar AROM WFL s symptoms to facilitate usual mobility at PLOF in standing/walking.  Goal Status: on going 11/02/2022    PLAN:   PT FREQUENCY: 1-2x/week   PT DURATION: 8 weeks   PLANNED INTERVENTIONS: Therapeutic exercises, Therapeutic activity, Neuro Muscular re-education, Balance training, Gait training, Patient/Family education, Joint mobilization, Stair training, DME instructions, Dry Needling, Electrical stimulation, Traction, Cryotherapy, vasopneumatic deviceMoist heat, Taping, Ultrasound, Ionotophoresis 4mg /ml Dexamethasone, and Manual therapy.  All included unless contraindicated   PLAN FOR NEXT SESSION: Continued stability and strengthening as tolerated.  Dry needling as desired. How where nerve glides?   Elsie Ra, PT, DPT 12/06/22 8:06 AM

## 2022-12-07 ENCOUNTER — Ambulatory Visit: Payer: BC Managed Care – PPO | Admitting: Sports Medicine

## 2022-12-07 ENCOUNTER — Encounter: Payer: Self-pay | Admitting: Sports Medicine

## 2022-12-07 DIAGNOSIS — S86112D Strain of other muscle(s) and tendon(s) of posterior muscle group at lower leg level, left leg, subsequent encounter: Secondary | ICD-10-CM | POA: Diagnosis not present

## 2022-12-07 DIAGNOSIS — G8929 Other chronic pain: Secondary | ICD-10-CM

## 2022-12-07 DIAGNOSIS — D4709 Other mast cell neoplasms of uncertain behavior: Secondary | ICD-10-CM

## 2022-12-07 DIAGNOSIS — M24111 Other articular cartilage disorders, right shoulder: Secondary | ICD-10-CM | POA: Diagnosis not present

## 2022-12-07 DIAGNOSIS — M25511 Pain in right shoulder: Secondary | ICD-10-CM

## 2022-12-07 DIAGNOSIS — M357 Hypermobility syndrome: Secondary | ICD-10-CM | POA: Diagnosis not present

## 2022-12-07 NOTE — Progress Notes (Signed)
Feeling good; States she doesn't feel any swelling, doesn't have any stiffness Overall improved

## 2022-12-07 NOTE — Progress Notes (Signed)
Robin [name] - 52 y.o. female MRN [id number]  Date of birth: 04-01-1971  Office Visit Note: Visit Date: 12/07/2022 PCP: Sandrea Hughs, NP Referred by: Sandrea Hughs, NP  Subjective: Chief Complaint  Patient presents with   Left Knee - Follow-up   HPI: Robin [name] is a pleasant 52 y.o. female who presents today for follow-up of posterior knee pain.  Gastrocnemius strain/posterior knee pain -she is very pleased with the improvement.  Feels like she is at least 60-70% improved which in the past felt like she was only up to 30%.  Is continuing some of the home rehab therapy exercises, does feel like shockwave has been very beneficial.  Chronic right shoulder pain -she has gotten good relief from ultrasound-guided glenohumeral joint injections.  Is open to the discussion on prolotherapy.  Does do dry needling and other physical therapy at [facility], which she has been finding helpful.  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  She is asking about her low protein or creatinine levels from her labs with Dr. Cristie Hem on 3/20.  Pertinent ROS were reviewed with the patient and found to be negative unless otherwise specified above in HPI.   Assessment & Plan: Visit Diagnoses:  1. Strain of gastrocnemius muscle of left lower extremity, subsequent encounter   2. Hypermobility syndrome   3. Labral tear of shoulder, degenerative, right   4. Mast cell hyperplasia   5. Chronic right shoulder pain    Plan: Both [name] and I are pleased with the progress she is making for her posterior knee gastrocnemius pain.  Did proceed with an additional treatment of extracorporeal shockwave therapy.  Would like to proceed with 1-2 more and then take a holiday and see how she does over the coming month.  We discussed other options for her shoulder, which I think are more so stemming from her hypermobility, although she does have some mild  supraspinatus tearing and cartilage loss of the glenohumeral joint.  She will follow-up with Dr. Sherrian Divers, she is getting his opinion on whether seeing Dr. Marlou Sa would be an option as well.  We did ascertain the idea of prolotherapy into the glenohumeral joint or even possibly the labrum to help scar this down and prevent hypermobility, we will keep this option open in the future.  I would like her to continue her happed, discussion on static based therapy and avoiding extreme range of motion.  She will continue PT and dry needling as well.  I did review her labs, she has a low urine protein, however her creatinine is 0.86.  We did discuss the option of maybe supplementing with a creatine supplement, although given the normal creatinine, I am not sure if this would be beneficial or not.  May suggest she follow-up on this idea with her specialist and/or Dr. Estanislado Pandy. F/u in 1 week.  Follow-up: Return in about 1 week (around 12/14/2022).   Meds & Orders: No orders of the defined types were placed in this encounter.  No orders of the defined types were placed in this encounter.    Procedures: Procedure: ECSWT Indications: Gastrocnemius tendinopathic changes   Procedure Details Consent: Risks of procedure as well as the alternatives and risks of each were explained to the patient.  Verbal consent for procedure obtained. Time Out: Verified patient identification, verified procedure, site was marked, verified correct patient position. The area was cleaned with alcohol swab.     The  left proximal gastroc, distal hamstring, and posterior knee was targeted for Extracorporeal shockwave therapy.    Preset: Status post muscular injury Power Level: 120 mJ  Frequency: 14 Hz Impulse/cycles: 3500 Head size: Regular   Patient tolerated procedure well without immediate complications      Clinical History: No specialty comments available.  She reports that she quit smoking about 21 years ago. Her smoking use  included cigarettes. She has a 7.50 pack-year smoking history. She has never been exposed to tobacco smoke. She has never used smokeless tobacco. No results for input(s): "HGBA1C", "LABURIC" in the last 8760 hours.  Lab Results  Component Value Date   CREATININE 0.86 11/30/2022   Ur Cr: 15 (29 last visit)  Objective:    Physical Exam  Gen: Well-appearing, in no acute distress; non-toxic CV: Well-perfused. Warm.  Resp: Breathing unlabored on room air; no wheezing. Psych: Fluid speech in conversation; appropriate affect; normal thought process Neuro: Sensation intact throughout. No gross coordination deficits.   Ortho Exam  - Left knee: Very small palpation of the possible Baker's cyst in the popliteal fossa, certainly improved from prior visits. Full range of motion about the knee in all directions.  No redness or ecchymosis.  Improved strength with hamstring and knee flexion.  Imaging:  Narrative & Impression  CLINICAL DATA:  Right shoulder pain for 5 years.   EXAM: MRI OF THE RIGHT SHOULDER WITH CONTRAST   TECHNIQUE: Multiplanar, multisequence MR imaging of the right shoulder shoulder was performed following the administration of intra-articular contrast.   CONTRAST:  See Injection Documentation.   COMPARISON:  None Available.   FINDINGS: Rotator cuff: Moderate tendinosis of the supraspinatus tendon with a partial-thickness bursal surface tear and a partial-thickness articular surface tear. Mild tendinosis of the infraspinatus tendon. Teres minor tendon is intact. Subscapularis tendon is intact.   Muscles: No muscle atrophy or edema. No intramuscular fluid collection or hematoma.   Biceps Long Head: Intraarticular and extraarticular portions of the biceps tendon are intact.   Acromioclavicular Joint: No significant arthropathy of the acromioclavicular joint. Small amount of subacromial/subdeltoid bursal fluid.   Glenohumeral Joint: Intraarticular contrast  distending the joint capsule. Normal glenohumeral ligaments. Mild partial-thickness cartilage loss of the glenohumeral joint.   Labrum: Intact.   Bones: No fracture or dislocation. No aggressive osseous lesion.   Other: No fluid collection or hematoma.   IMPRESSION: 1. Moderate tendinosis of the supraspinatus tendon with a partial-thickness bursal surface tear and a partial-thickness articular surface tear. 2. Mild tendinosis of the infraspinatus tendon. 3. Mild subacromial/subdeltoid bursitis.     Electronically Signed   By: Kathreen Devoid M.D.   On: 08/10/2022 06:33    Past Medical/Family/Surgical/Social History: Medications & Allergies reviewed per EMR, new medications updated. Patient Active Problem List   Diagnosis Date Noted   Left hamstring injury 07/15/2022   Labral tear of shoulder, degenerative, right 06/21/2022   Left carpal tunnel syndrome 05/26/2022   Foot sprain, left, initial encounter 05/19/2022   Baker's cyst of knee, left 04/21/2022   Right carpal tunnel syndrome 03/16/2022   Flushing 04/14/2021   Hypermobility syndrome 11/27/2020   Seasonal and perennial allergic rhinitis 11/06/2018   Mild intermittent asthma without complication [id number]   Flexural atopic dermatitis 11/06/2018   History of hyperlipidemia 11/08/2016   History of anxiety 11/08/2016   History of retinal tear 11/08/2016   History of Bilateral plantar fasciitis 08/10/2016   High risk medication use 07/04/2016   Lupus ([place]) 05/23/2016   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[place])  03/18/2016   ADHD (attention deficit hyperactivity disorder) 12/22/2014   Raynaud's phenomenon 10/17/2013   Hyperlipidemia 09/28/2011   Past Medical History:  Diagnosis Date   ADD (attention deficit disorder)    Allergy    Anemia    Anorexia nervosa with bulimia    Per [place] New Patient Packet   Anxiety    Asthma    Carpal tunnel syndrome    Per [place] New Patient Packet   Depression    Dry eyes    Per [place] New Patient Packet    Dry mouth    Per [place] New Patient Packet   Fibroids    Per [place] [facility] Patient Packet   History of hip x-ray 2021   Per [place] [facility] Patient Packet   History of Papanicolaou smear of cervix 2021   Per [place] New Patient Packet, Dr.Pickens   Hyperlipidemia    Ileitis    Lupus ([place])    PONV (postoperative nausea and vomiting)    mild nausea    Raynaud disease    Retinal tear    Per [place] New Patient Packet   Systemic lupus erythematosus ([place])    Family History  Problem Relation Age of Onset   Heart disease Mother 86       CAD/stenting   Hyperlipidemia Mother    Arthritis Mother        ostearthritis   Hypertension Mother    Kidney disease Mother    Irritable bowel syndrome Mother    Stroke Mother 35       CVA   Fibromyalgia Mother    Eczema Mother    Sjogren's syndrome Mother    Hyperlipidemia Father    Neuropathy Father    Allergies Father        shellfish   Prostatitis Father    Hyperlipidemia Sister    ADD / ADHD Sister    Bipolar disorder Sister    Depression Sister    Hypertension Brother    OCD Brother    Anxiety disorder Daughter    ADD / ADHD Daughter    Asthma Daughter    OCD Son    Colon cancer Neg Hx    Inflammatory bowel disease Neg Hx    Allergic rhinitis Neg Hx    Angioedema Neg Hx    Atopy Neg Hx    Immunodeficiency Neg Hx    Urticaria Neg Hx    Past Surgical History:  Procedure Laterality Date   CARPAL TUNNEL RELEASE Right    08/2019   CESAREAN SECTION  2003   CESAREAN SECTION  2005   Per [facility] New Patient Packet   COLONOSCOPY  2017   Dr.Jacobs   cyst drained Left 2023   knee   DENTAL SURGERY  2009   Fromed tooth and chronic infection   DILITATION & CURRETTAGE/HYSTROSCOPY WITH NOVASURE ABLATION N/A 06/03/2020   Procedure: DILATATION & CURETTAGE/HYSTEROSCOPY WITH NOVASURE ABLATION;  Surgeon: Aletha Halim, MD;  Location: [facility];  Service: Gynecology;  Laterality: N/A;   ENDOMETRIAL BIOPSY  04/20/2020       EYE SURGERY  Bilateral    cryoretinopexy   Social History   Occupational History   Not on file  Tobacco Use   Smoking status: Former    Packs/day: 0.75    Years: 10.00    Additional pack years: 0.00    Total pack years: 7.50    Types: Cigarettes    Quit date: 07/05/2001    Years since quitting: 21.4  Passive exposure: Never   Smokeless tobacco: Never  Vaping Use   Vaping Use: Never used  Substance and Sexual Activity   Alcohol use: Yes    Comment: occ   Drug use: Never   Sexual activity: Yes    Birth control/protection: Condom   I spent 35 minutes in the care of the patient today including face-to-face time, preparation to see the patient, as well as review of MRI and discussion on alternative treatment options such as prolotherapy as well as home rehab and strengthening based exercises; counseling and educating the patient on HEP, holistic treatment options, review of labwork from 11/30/22 (ANA, CMP, CBC, ESR), discussion of possible creatinine supplements and elimination diet for her EDS, mast cell hypersensitivity and the above diagnoses.   Elba Barman, DO Primary [facility] Sports Medicine Physician  [place]  This note was dictated using Dragon naturally speaking software and may contain errors in syntax, spelling, or content which have not been identified prior to signing this note.

## 2022-12-08 ENCOUNTER — Encounter: Payer: Self-pay | Admitting: Physical Therapy

## 2022-12-08 ENCOUNTER — Ambulatory Visit: Payer: BC Managed Care – PPO | Admitting: Physical Therapy

## 2022-12-08 DIAGNOSIS — M25511 Pain in right shoulder: Secondary | ICD-10-CM

## 2022-12-08 DIAGNOSIS — M79605 Pain in left leg: Secondary | ICD-10-CM

## 2022-12-08 DIAGNOSIS — M6281 Muscle weakness (generalized): Secondary | ICD-10-CM

## 2022-12-08 DIAGNOSIS — R262 Difficulty in walking, not elsewhere classified: Secondary | ICD-10-CM

## 2022-12-08 DIAGNOSIS — G8929 Other chronic pain: Secondary | ICD-10-CM

## 2022-12-08 NOTE — Therapy (Signed)
OUTPATIENT PHYSICAL THERAPY TREATMENT NOTE/Recert   Patient Name: Robin [name] MRN: [id number] DOB:01-Aug-1971, 52 y.o., female Today's Date: 12/08/2022    END OF SESSION:   PT End of Session - 12/08/22 0829     Visit Number 17    Number of Visits 28    Date for PT Re-Evaluation 01/19/23    Authorization Type [employer] $[id number] copay    Authorization - Number of Visits 19    PT Start Time 0812    PT Stop Time 0850    PT Time Calculation (min) 38 min    Activity Tolerance Patient tolerated treatment well    Behavior During Therapy WFL for tasks assessed/performed                Past Medical History:  Diagnosis Date   ADD (attention deficit disorder)    Allergy    Anemia    Anorexia nervosa with bulimia    Per [facility]   Anxiety    Asthma    Carpal tunnel syndrome    Per [place] [facility]   Depression    Dry eyes    Per [place] [facility]   Dry mouth    Per [place] [facility]   Fibroids    Per [place] [facility]   History of hip x-ray 2021   Per [place] [facility]   History of Papanicolaou smear of cervix 2021   Per [place] [facility], Dr.Pickens   Hyperlipidemia    Ileitis    Lupus ([place])    PONV (postoperative nausea and vomiting)    mild nausea    Raynaud disease    Retinal tear    Per [facility] New Patient Packet   Systemic lupus erythematosus ([place])    Past Surgical History:  Procedure Laterality Date   CARPAL TUNNEL RELEASE Right    08/2019   CESAREAN SECTION  2003   CESAREAN SECTION  2005   Per [facility]   COLONOSCOPY  2017   Dr.Jacobs   cyst drained Left 2023   knee   DENTAL SURGERY  2009   Fromed tooth and chronic infection   DILITATION & CURRETTAGE/HYSTROSCOPY WITH NOVASURE ABLATION N/A 06/03/2020   Procedure: DILATATION & CURETTAGE/HYSTEROSCOPY WITH NOVASURE ABLATION;  Surgeon: Aletha Halim, MD;  Location: [place];  Service: Gynecology;  Laterality: N/A;   ENDOMETRIAL  BIOPSY  04/20/2020       EYE SURGERY Bilateral    cryoretinopexy   Patient Active Problem List   Diagnosis Date Noted   Left hamstring injury 07/15/2022   Labral tear of shoulder, degenerative, right 06/21/2022   Left carpal tunnel syndrome 05/26/2022   Foot sprain, left, initial encounter 05/19/2022   Baker's cyst of knee, left 04/21/2022   Right carpal tunnel syndrome 03/16/2022   Flushing 04/14/2021   Hypermobility syndrome 11/27/2020   Seasonal and perennial allergic rhinitis 11/06/2018   Mild intermittent asthma without complication [id number]   Flexural atopic dermatitis 11/06/2018   History of hyperlipidemia 11/08/2016   History of anxiety 11/08/2016   History of retinal tear 11/08/2016   History of Bilateral plantar fasciitis 08/10/2016   High risk medication use 07/04/2016   Lupus ([place]) 05/23/2016   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[place]) 03/18/2016   ADHD (attention deficit hyperactivity disorder) 12/22/2014   Raynaud's phenomenon 10/17/2013   Hyperlipidemia 09/28/2011     THERAPY DIAG:  Chronic right shoulder pain  Pain in left leg  Muscle weakness (  generalized)  Difficulty in walking, not elsewhere classified  PCP: Ngetich, Nelda Bucks NP   REFERRING PROVIDER: Leandrew Koyanagi, MD   REFERRING DIAG: [id number] (ICD-10-CM) - Left hamstring injury, initial encounter    Rationale for Evaluation and Treatment: Rehabilitation   ONSET DATE: Around 07/13/2022 for hamstring, several years for other complaints of Rt shoulder pain and back pain   SUBJECTIVE:    SUBJECTIVE STATEMENT: She relays her hamstring is doing great but her Rt shoulder is bad.    PERTINENT HISTORY: Lt hamstring tear grade 1. Bakers Cyst per MD note.  Med history : lupus  NEW MRI Rt shoulder IMPRESSION: 1. Moderate tendinosis of the supraspinatus tendon with a partial-thickness bursal surface tear and a partial-thickness articular surface tear. 2. Mild tendinosis of the infraspinatus tendon. 3. Mild  subacromial/subdeltoid bursitis.   PAIN:  Pain location:  7/10 Rt shoulder Description:  tenderness, tightness, spasms Aggravating factors: Rt shoulder: reaching upward /outward Relieving factors: taping, stretching, previous treatment, injection helped   PRECAUTIONS: None   WEIGHT BEARING RESTRICTIONS: No   FALLS:  Has patient fallen in last 6 months? Yes 2x - Outside of house Pt indicated a fear of falling.     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  Heal hamstring completely     OBJECTIVE:     PATIENT SURVEYS:  11/08/2022:  FOTO update 51  08/16/2022: FOTO update 49  07/26/2022 FOTO intake:31 predicted:  66   COGNITION: 07/26/2022 Overall cognitive status: WFL                        SENSATION: 07/26/2022 WFL   MUSCLE LENGTH: 07/26/2022 Hamstrings passive SLR supine: Right 90 deg; Left 70 deg c pain   POSTURE:  07/26/2022 unremarkable in standing   PALPATION: 07/26/2022 Tenderness noted to light touch in Lt lumbar paraspinals   ROM:    ROM 07/26/2022 Right 07/26/2022 Left 07/26/2022 08/09/2022 Right 08/23/22  Shoulder flexion        WNL  Shoulder abduction        WNL  Shoulder internal rotation        WNL  Shoulder external rotation        WNL         Knee flexion          Knee extension                     Lumbar extension 75% WFL c end range back complaints   Repeated x 5 in standing c ERP worsened     75% WFL c tightness lower back (more on Lt)   Lumbar flexion To floor no complaints     Hands flat to floor               (Blank rows = not tested)   UPPER EXTREMITY ROM:  ROM Right eval Left eval   Shoulder flexion  WFL with painful arc in sitting and  supine   Shoulder extension     Shoulder abduction     Shoulder adduction     Shoulder extension     Shoulder internal rotation  60 AROM in 45 deg abduction   Shoulder external rotation  80 AROM in 45 deg abduction    Elbow flexion     Elbow extension     Wrist flexion          Lower trap     Middle trap                (Blank rows = not tested)    LOWER EXTREMITY MMT:   MMT Right 07/26/2022 Left 07/26/2022 Left 07/28/22 Left 08/09/2022 Left 1/31/204  Hip flexion 5/5 5/5     Hip extension Check next visit Check next visit 4/5    Hip abduction Check next visit Check next visit 4/5    Hip adduction         Hip internal rotation         Hip external rotation         Knee flexion 5/5   39, 36.4 lbs   3+/5 14 lbs With  pain   4/5 20.6, 18.4 lbs 4/5 26, 23 lbs  Knee extension 5/5 5/5     Ankle dorsiflexion 5/5 5/5     Ankle plantarflexion         Ankle inversion         Ankle eversion          (Blank rows = not tested)  UPPER EXTREMITY MMT:  MMT Right 08/23/22 Right 10/12/2021 Right 11/02/2022 Left 11/02/2022 Right 11/25/2022 Left 11/25/2022  Shoulder flexion 5 5/5 c pain   4+/5 c mild pain 5/5  Shoulder extension        Shoulder abduction 4 3+/5 c pain   4/5 c pain 5/5  Shoulder adduction        Shoulder extension        Shoulder internal rotation 4 5/5      Shoulder external rotation 4- 4/5 c pain      Middle trapezius   4/5 4/5 5/5 5/5  Lower trapezius   4/5 4/5 4+/5 c mild pain 5/5  Elbow flexion        Elbow extension        Wrist flexion        Wrist extension        Wrist ulnar deviation        Wrist radial deviation        Wrist pronation        Wrist supination        Grip strength         (Blank rows = not tested)    SPECIAL TESTS:  07/26/2022 Lt slump test positive for complaints in thigh and calf, Negative on Rt Negative crossed slr bilateral for any radicular pain   FUNCTIONAL TESTS:  08/16/2022:  Lt SLS:  18  seconds  07/26/2022 18 inch chair transfer:  1st try without hands Lt SLS:  10 seconds, stopped due to back related complaints Rt SLS:  30 seconds c mild aberrant movement   GAIT: 07/26/2022 Independent ambulation      TODAY'S TREATMENT  12/06/2022: Therex Rt upper limb neural tension glides X 10 each for median, radial, ulnar, performed bilat as she felt she needed to Standing bilat ER 2X 10 red band (did isometric hold on Rt UE to reduce pain with this) Standing blue band bilateral GH ext and rows x 20 each  Manual: Compression to Rt shoulder and skilled palpation with DN   Trigger Point Dry-Needling  Treatment instructions: Expect mild to moderate muscle soreness. S/S of pneumothorax if dry needled over a lung  field, and to seek immediate medical attention should they occur. Patient verbalized understanding of these instructions and education.  Patient Consent Given: Yes Education handout provided: Previously provided Muscles treated: Rt infraspinatus, Rt upper trap, Rt deltoid Treatment response/outcome: local twitch response noted , no adverse reaction  11/25/2022: Therex UBE UE/ LE 8 mins fwd lvl 4.0 Cross arm stretch Rt 15 sec x 3 Rt upper trap stretch 15 sec x 3 Standing green band shoulder ER c towel under elbow 2 x 15 Standing blue band bilateral GH ext x 20  Manual: Compression to Rt infraspinatus, Rt upper trap.  Ktaping to Rt shoulder (Vertical anterior and posterior semicircle strips from lateral upper arm to upper trap as well as strip horizontal from anterior to posterior shoulder across lateral upper arm. )   Trigger Point Dry-Needling  Treatment instructions: Expect mild to moderate muscle soreness. S/S of pneumothorax if dry needled over a lung field, and to seek immediate medical attention should they occur. Patient verbalized understanding of these instructions and education.  Patient Consent Given: Yes Education handout provided: Previously  provided Muscles treated: Rt infraspinatus, Rt upper trap  Treatment response/outcome: local twitch response noted , no adverse reaction     PATIENT EDUCATION:   10/12/2022 Education details: HEP update Person educated: Patient Education method: Consulting civil engineer, Demonstration, Verbal cues, and Handouts Education comprehension: verbalized understanding, returned demonstration, and verbal cues required   HOME EXERCISE PROGRAM: Access Code: [email or web address] URL: [email or web address] Date: 12/08/2022 Prepared by: Elsie Ra  Exercises - Supine 90/90 Sciatic Nerve Glide with Knee Flexion/Extension  - 1-2 x daily - 7 x weekly - 1-2 sets - 10 reps - Sidelying Hip Abduction  - 1-2 x daily - 7 x weekly - 2-3 sets - 10-15 reps - Standing Hip Hiking  - 1-2 x daily - 7 x weekly - 1 sets - 10 reps - 5 hold - Seated Hamstring Set (Mirrored)  - 2-3 x daily - 7 x weekly - 1 sets - 10-15 reps - 5-10 hold - Prone Alternating Arm and Leg Lifts  - 1-2 x daily - 7 x weekly - 1-2 sets - 10 reps - 3 hold - Sidelying Lumbar Rotation Stretch (Mirrored)  - 2-3 x daily - 7 x weekly - 1 sets - 5 reps - 15 hold - Standing Isometric Shoulder Internal Rotation at Doorway  - 2 x daily - 6 x weekly - 1-2 sets - 10 reps - 5 hold - Standing Isometric Shoulder External Rotation with Doorway (Mirrored)  - 2 x daily - 6 x weekly - 1-2 sets - 10 reps - 5 hold - Standing Isometric Shoulder Flexion with Doorway - Arm Bent  - 2 x daily - 6 x weekly - 1-2 sets - 10 reps - 5 hold - Standing Isometric Shoulder Abduction with Doorway - Arm Bent (Mirrored)  - 2 x daily - 6 x weekly - 1-2 sets - 10 reps - 5 hold - Sidelying Shoulder External Rotation (Mirrored)  - 1-2 x daily - 7 x weekly - 2-3 sets - 10-15 reps - Standing Shoulder Posterior Capsule Stretch (Mirrored)  - 2-3 x daily - 7 x weekly - 1 sets - 5 reps - 15-30 hold - Prone Single Arm Shoulder Y  - 2 x daily - 6 x weekly - 2-3 sets - 10 reps - 3 sec hold - Prone  Shoulder Horizontal Abduction  - 2 x daily - 3-4 x weekly - 2-3 sets - 10 reps - 3 sec  hold - Prone Shoulder Extension - Single Arm  - 2 x daily - 3-4 x weekly - 2-3 sets - 10 reps - 3 sec hold - Bridge Walk Out  - 2 x daily - 6 x weekly - 2 sets - 10 reps - Single-Leg [place] Deadlift With Dumbbell  - 2 x daily - 6 x weekly - 1-2 sets - 10 reps - Standing Hamstring Curl with Resistance  - 2 x daily - 6 x weekly - 1-2 sets - 10 reps   ASSESSMENT:   CLINICAL IMPRESSION: Recert today as her PT plan of care has expired. She has made good progress her her knee/hamstring pain but continues to be limited by Rt shoulder pain and has not yet met PT goals for this. PT recommending to extend her PT plan of care up to another 6 weeks to continue working on strength and pain management of Rt shoulder. I did progress her hamstring exercises today and added these to updated HEP to reflect her progress.   OBJECTIVE IMPAIRMENTS: decreased activity tolerance, decreased balance, decreased coordination, decreased endurance, decreased mobility, difficulty walking, decreased ROM, decreased strength, increased fascial restrictions, impaired perceived functional ability, increased muscle spasms, impaired flexibility, improper body mechanics, and pain.    ACTIVITY LIMITATIONS: carrying, lifting, bending, standing, squatting, stairs, transfers, and locomotion level   PARTICIPATION LIMITATIONS: cleaning, interpersonal relationship, community activity, occupation, yard work, and exercise routine, walking dog   PERSONAL FACTORS: Time since onset of injury/illness/exacerbation and multiple treatment areas, lupus  are also affecting patient's functional outcome.    REHAB POTENTIAL: Good   CLINICAL DECISION MAKING: Stable/uncomplicated   EVALUATION COMPLEXITY: Low     GOALS: Goals reviewed with patient? Yes   SHORT TERM GOALS: (target date for Short term goals are 3 weeks 08/16/2022)    1.  Patient will demonstrate  independent use of home exercise program to maintain progress from in clinic treatments.   Goal status: Met 08/16/2022   LONG TERM GOALS: (target dates for all long term goals are 6 weeks  01/19/23 )   1. Patient will demonstrate/report pain at worst less than or equal to 2/10 to facilitate minimal limitation in daily activity secondary to pain symptoms.   Goal status: on going 12/08/2022   2. Patient will demonstrate independent use of home exercise program to facilitate ability to maintain/progress functional gains from skilled physical therapy services.   Goal status: on going, progressed today 12/08/22   3. Patient will demonstrate FOTO outcome > or = 58 % to indicate reduced disability due to condition.   Goal status:on going 12/08/2022   4.  Patient will demonstrate Lt LE MMT 5/5 , dynamometry within 15 % of Rt, Rt shoulder MMT 5/5 throughout to faciltiate usual transfers, stairs, squatting at [facility] for daily life.    Goal status: on going 12/08/2022   5.  Patient will demonstrate Lt passive hamstring SLR to 90 deg equal to Rt s symptoms for normal mobility.  Goal status: on going 11/02/2022   6.  Patient will demonstrate bilateral SLS > 30 seconds to facilitate stability in ambulation on even and uneven ground.  Goal status:on going 12/08/2022   7.  Patient will demonstrate lumbar AROM WFL s symptoms to facilitate usual mobility at PLOF in standing/walking.  Goal Status: on going 12/08/2022   PLAN:   PT FREQUENCY: 1-2x/week   PT DURATION: 8 weeks   PLANNED INTERVENTIONS: Therapeutic exercises, Therapeutic activity, Neuro Muscular re-education, Balance training, Gait training, Patient/Family education, Joint  mobilization, Stair training, DME instructions, Dry Needling, Electrical stimulation, Traction, Cryotherapy, vasopneumatic deviceMoist heat, Taping, Ultrasound, Ionotophoresis 4mg /ml Dexamethasone, and Manual therapy.  All included unless contraindicated   PLAN FOR NEXT SESSION:  Continued stability and strengthening as tolerated.  Dry needling as desired. How where nerve glides?   Elsie Ra, PT, DPT 12/08/22 9:03 AM

## 2022-12-13 ENCOUNTER — Encounter: Payer: Self-pay | Admitting: Rehabilitative and Restorative Service Providers"

## 2022-12-13 ENCOUNTER — Ambulatory Visit: Payer: BC Managed Care – PPO | Admitting: Rehabilitative and Restorative Service Providers"

## 2022-12-13 DIAGNOSIS — M79605 Pain in left leg: Secondary | ICD-10-CM

## 2022-12-13 DIAGNOSIS — G8929 Other chronic pain: Secondary | ICD-10-CM

## 2022-12-13 DIAGNOSIS — R262 Difficulty in walking, not elsewhere classified: Secondary | ICD-10-CM

## 2022-12-13 DIAGNOSIS — M25511 Pain in right shoulder: Secondary | ICD-10-CM

## 2022-12-13 DIAGNOSIS — M6281 Muscle weakness (generalized): Secondary | ICD-10-CM | POA: Diagnosis not present

## 2022-12-13 NOTE — Therapy (Signed)
OUTPATIENT PHYSICAL THERAPY TREATMENT NOTE/Recert   Patient Name: Robin [name] MRN: [id number] DOB:1971/06/05, 52 y.o., female Today's Date: 12/13/2022    END OF SESSION:   PT End of Session - 12/13/22 0823     Visit Number 18    Number of Visits 28    Date for PT Re-Evaluation 01/19/23    Authorization Type [employer] $[id number] copay    Authorization - Number of Visits 19    PT Start Time 0804    PT Stop Time 0843    PT Time Calculation (min) 39 min    Activity Tolerance Patient tolerated treatment well    Behavior During Therapy WFL for tasks assessed/performed                 Past Medical History:  Diagnosis Date   ADD (attention deficit disorder)    Allergy    Anemia    Anorexia nervosa with bulimia    Per [place] New Patient Packet   Anxiety    Asthma    Carpal tunnel syndrome    Per [place] New Patient Packet   Depression    Dry eyes    Per [place] New Patient Packet   Dry mouth    Per [place] New Patient Packet   Fibroids    Per [place] New Patient Packet   History of hip x-ray 2021   Per [place] New Patient Packet   History of Papanicolaou smear of cervix 2021   Per [place] New Patient Packet, Dr.Pickens   Hyperlipidemia    Ileitis    Lupus    PONV (postoperative nausea and vomiting)    mild nausea    Raynaud disease    Retinal tear    Per [facility] New Patient Packet   Systemic lupus erythematosus    Past Surgical History:  Procedure Laterality Date   CARPAL TUNNEL RELEASE Right    08/2019   CESAREAN SECTION  2003   CESAREAN SECTION  2005   Per [facility] New Patient Packet   COLONOSCOPY  2017   Dr.Jacobs   cyst drained Left 2023   knee   DENTAL SURGERY  2009   Fromed tooth and chronic infection   DILITATION & CURRETTAGE/HYSTROSCOPY WITH NOVASURE ABLATION N/A 06/03/2020   Procedure: DILATATION & CURETTAGE/HYSTEROSCOPY WITH NOVASURE ABLATION;  Surgeon: Aletha Halim, MD;  Location: [place];  Service: Gynecology;  Laterality: N/A;   ENDOMETRIAL BIOPSY   04/20/2020       EYE SURGERY Bilateral    cryoretinopexy   Patient Active Problem List   Diagnosis Date Noted   Left hamstring injury 07/15/2022   Labral tear of shoulder, degenerative, right 06/21/2022   Left carpal tunnel syndrome 05/26/2022   Foot sprain, left, initial encounter 05/19/2022   Baker's cyst of knee, left 04/21/2022   Right carpal tunnel syndrome 03/16/2022   Flushing 04/14/2021   Hypermobility syndrome 11/27/2020   Seasonal and perennial allergic rhinitis 11/06/2018   Mild intermittent asthma without complication [id number]   Flexural atopic dermatitis 11/06/2018   History of hyperlipidemia 11/08/2016   History of anxiety 11/08/2016   History of retinal tear 11/08/2016   History of Bilateral plantar fasciitis 08/10/2016   High risk medication use 07/04/2016   Lupus ([place]) 05/23/2016   Terminal ileitis 03/18/2016   ADHD (attention deficit hyperactivity disorder) 12/22/2014   Raynaud's phenomenon 10/17/2013   Hyperlipidemia 09/28/2011     THERAPY DIAG:  Chronic right shoulder pain  Pain in left leg  Muscle weakness (generalized)  Difficulty in walking, not elsewhere classified  PCP: Ngetich, Nelda Bucks NP   REFERRING PROVIDER: Leandrew Koyanagi, MD   REFERRING DIAG: [phone] (ICD-10-CM) - Left hamstring injury, initial encounter    Rationale for Evaluation and Treatment: Rehabilitation   ONSET DATE: Around 07/13/2022 for hamstring, several years for other complaints of Rt shoulder pain and back pain   SUBJECTIVE:    SUBJECTIVE STATEMENT: She indicated shoulder was painful during the week but noted improvement in both sides from needling last visit.  She reported "tense" in upper back. She mentioned the nerve glides helping but at times not wanting to do exercise when irritated.    PERTINENT HISTORY: Lt hamstring tear grade 1. Bakers Cyst per MD note.  Med history : lupus  NEW MRI Rt shoulder IMPRESSION: 1. Moderate tendinosis of the supraspinatus  tendon with a partial-thickness bursal surface tear and a partial-thickness articular surface tear. 2. Mild tendinosis of the infraspinatus tendon. 3. Mild subacromial/subdeltoid bursitis.   PAIN:  Pain location:  7/10 Rt shoulder Description:  tenderness, tightness, spasms Aggravating factors: Rt shoulder: reaching upward /outward Relieving factors: taping, stretching, previous treatment, injection helped   PRECAUTIONS: None   WEIGHT BEARING RESTRICTIONS: No   FALLS:  Has patient fallen in last 6 months? Yes 2x - Outside of house Pt indicated a fear of falling.     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  Heal hamstring completely     OBJECTIVE:     PATIENT SURVEYS:  11/08/2022:  FOTO update 51  08/16/2022: FOTO update 49  07/26/2022 FOTO intake:31 predicted:  60   COGNITION: 07/26/2022 Overall cognitive status: WFL                        SENSATION: 07/26/2022 WFL   MUSCLE LENGTH: 07/26/2022 Hamstrings passive SLR supine: Right 90 deg; Left 70 deg c pain   POSTURE:  07/26/2022 unremarkable in standing   PALPATION: 07/26/2022 Tenderness noted to light touch in Lt lumbar paraspinals   ROM:    ROM 07/26/2022 Right 07/26/2022 Left 07/26/2022 08/09/2022 Right 08/23/22  Shoulder flexion        WNL  Shoulder abduction        WNL  Shoulder internal rotation        WNL  Shoulder external rotation        WNL         Knee flexion          Knee extension                     Lumbar extension 75% WFL c end range back complaints   Repeated x 5 in standing c ERP worsened     75% WFL c tightness lower back (more on Lt)   Lumbar flexion To floor no complaints     Hands flat to  floor               (Blank rows = not tested)   UPPER EXTREMITY ROM:  ROM Right eval Left eval   Shoulder flexion  WFL with painful arc in sitting and supine   Shoulder extension     Shoulder abduction     Shoulder  adduction     Shoulder extension     Shoulder internal rotation  60 AROM in 45 deg abduction   Shoulder external rotation  80 AROM in 45 deg abduction    Elbow flexion     Elbow extension     Wrist flexion          Lower trap     Middle trap                (Blank rows = not tested)    LOWER EXTREMITY MMT:   MMT Right 07/26/2022 Left 07/26/2022 Left 07/28/22 Left 08/09/2022 Left 1/31/204  Hip flexion 5/5 5/5     Hip extension Check next visit Check next visit 4/5    Hip abduction Check next visit Check next visit 4/5    Hip adduction         Hip internal rotation         Hip external rotation         Knee flexion 5/5   39, 36.4 lbs   3+/5 14 lbs With  pain   4/5 20.6, 18.4 lbs 4/5 26, 23 lbs  Knee extension 5/5 5/5     Ankle dorsiflexion 5/5 5/5     Ankle plantarflexion         Ankle inversion         Ankle eversion          (Blank rows = not tested)  UPPER EXTREMITY MMT:  MMT Right 08/23/22 Right 10/12/2021 Right 11/02/2022 Left 11/02/2022 Right 11/25/2022 Left 11/25/2022  Shoulder flexion 5 5/5 c pain   4+/5 c mild pain 5/5  Shoulder extension        Shoulder abduction 4 3+/5 c pain   4/5 c pain 5/5  Shoulder adduction        Shoulder extension        Shoulder internal rotation 4 5/5      Shoulder external rotation 4- 4/5 c pain      Middle trapezius   4/5 4/5 5/5 5/5  Lower trapezius   4/5 4/5 4+/5 c mild pain 5/5  Elbow flexion        Elbow extension        Wrist flexion        Wrist extension        Wrist ulnar deviation        Wrist radial deviation        Wrist pronation        Wrist supination        Grip strength         (Blank rows = not tested)    SPECIAL TESTS:  07/26/2022 Lt slump test positive for complaints  in thigh and calf, Negative on Rt Negative crossed slr bilateral for any radicular pain   FUNCTIONAL TESTS:  08/16/2022:  Lt SLS:  18 seconds  07/26/2022 18 inch chair transfer:  1st try without hands Lt SLS:  10 seconds, stopped due to back related complaints Rt SLS:  30 seconds c mild aberrant movement   GAIT: 07/26/2022 Independent ambulation      TODAY'S TREATMENT  12/13/2022: Therex Rt upper limb neural tension glides X 10 each for median, radial, ulnar.  Review for home use.  UBE UE/LE ROM 2 mins fwd, 2 mins backward lvl 3.0 Standing red band ER to neutral x 10, ER c flexion punch 2 x 10 , performed bilaterally  Standing blue band gh ext past legs 3  sec hold x 15  Leg curl machine double leg down, single leg eccentrics Lt leg 35 lbs 3 x 10   Manual: Compression to Rt shoulder and skilled palpation with DN   Trigger Point Dry-Needling  Treatment instructions: Expect mild to moderate muscle soreness. S/S of pneumothorax if dry needled over a lung field, and to seek immediate medical attention should they occur. Patient verbalized understanding of these instructions and education.  Patient Consent Given: Yes Education handout provided: Previously provided Muscles treated: Rt infraspinatus, bilateral upper trap Treatment response/outcome: local twitch response noted , no adverse reaction  12/06/2022: Therex Rt upper limb neural tension glides X 10 each for median, radial, ulnar, performed bilat as she felt she needed to Standing bilat ER 2X 10 red band (did isometric hold on Rt UE to reduce pain with this) Standing blue band bilateral GH ext and rows x 20 each  Manual: Compression to Rt shoulder and skilled palpation with DN   Trigger Point Dry-Needling  Treatment instructions: Expect mild to moderate muscle soreness. S/S of pneumothorax if dry needled over a lung field, and to seek immediate medical attention should they occur. Patient verbalized understanding of these  instructions and education.  Patient Consent Given: Yes Education handout provided: Previously provided Muscles treated: Rt infraspinatus, Rt upper trap, Rt deltoid Treatment response/outcome: local twitch response noted , no adverse reaction  11/25/2022: Therex UBE UE/ LE 8 mins fwd lvl 4.0 Cross arm stretch Rt 15 sec x 3 Rt upper trap stretch 15 sec x 3 Standing green band shoulder ER c towel under elbow 2 x 15 Standing blue band bilateral GH ext x 20  Manual: Compression to Rt infraspinatus, Rt upper trap.  Ktaping to Rt shoulder (Vertical anterior and posterior semicircle strips from lateral upper arm to upper trap as well as strip horizontal from anterior to posterior shoulder across lateral upper arm. )   Trigger Point Dry-Needling  Treatment instructions: Expect mild to moderate muscle soreness. S/S of pneumothorax if dry needled over a lung field, and to seek immediate medical attention should they occur. Patient verbalized understanding of these instructions and education.  Patient Consent Given: Yes Education handout provided: Previously provided Muscles treated: Rt infraspinatus, Rt upper trap  Treatment response/outcome: local twitch response noted , no adverse reaction     PATIENT EDUCATION:   10/12/2022 Education details: HEP update Person educated: Patient Education method: Consulting civil engineer, Demonstration, Verbal cues, and Handouts Education comprehension: verbalized understanding, returned demonstration, and verbal cues required   HOME EXERCISE PROGRAM: Access Code: [email or web address] URL: [email or web address] Date: 12/08/2022 Prepared by: Elsie Ra  Exercises - Supine 90/90 Sciatic Nerve Glide with Knee Flexion/Extension  - 1-2 x daily - 7 x weekly - 1-2 sets - 10 reps - Sidelying Hip Abduction  - 1-2 x daily - 7 x weekly - 2-3 sets - 10-15 reps - Standing Hip Hiking  - 1-2 x daily - 7 x weekly - 1 sets - 10 reps - 5 hold - Seated Hamstring Set  (Mirrored)  - 2-3 x daily - 7 x weekly - 1 sets - 10-15 reps - 5-10 hold - Prone Alternating Arm and Leg Lifts  - 1-2 x daily - 7 x weekly - 1-2 sets - 10 reps - 3 hold - Sidelying Lumbar Rotation Stretch (Mirrored)  - 2-3 x daily - 7 x weekly - 1 sets - 5 reps - 15 hold - Standing Isometric Shoulder Internal Rotation at Doorway  - 2 x daily - 6 x  weekly - 1-2 sets - 10 reps - 5 hold - Standing Isometric Shoulder External Rotation with Doorway (Mirrored)  - 2 x daily - 6 x weekly - 1-2 sets - 10 reps - 5 hold - Standing Isometric Shoulder Flexion with Doorway - Arm Bent  - 2 x daily - 6 x weekly - 1-2 sets - 10 reps - 5 hold - Standing Isometric Shoulder Abduction with Doorway - Arm Bent (Mirrored)  - 2 x daily - 6 x weekly - 1-2 sets - 10 reps - 5 hold - Sidelying Shoulder External Rotation (Mirrored)  - 1-2 x daily - 7 x weekly - 2-3 sets - 10-15 reps - Standing Shoulder Posterior Capsule Stretch (Mirrored)  - 2-3 x daily - 7 x weekly - 1 sets - 5 reps - 15-30 hold - Prone Single Arm Shoulder Y  - 2 x daily - 6 x weekly - 2-3 sets - 10 reps - 3 sec hold - Prone Shoulder Horizontal Abduction  - 2 x daily - 3-4 x weekly - 2-3 sets - 10 reps - 3 sec hold - Prone Shoulder Extension - Single Arm  - 2 x daily - 3-4 x weekly - 2-3 sets - 10 reps - 3 sec hold - Bridge Walk Out  - 2 x daily - 6 x weekly - 2 sets - 10 reps - Single-Leg [place] Deadlift With Dumbbell  - 2 x daily - 6 x weekly - 1-2 sets - 10 reps - Standing Hamstring Curl with Resistance  - 2 x daily - 6 x weekly - 1-2 sets - 10 reps   ASSESSMENT:   CLINICAL IMPRESSION: Continued with performance of manual and dry needling for Rt shoulder symptom relief.  Strong reproduction of concordant Rt shoulder symptoms noted with infraspinatus on Rt trigger points.  Very mild limitation c mild symptoms in mid thoracic PAIVM assessment.   Strengthening improvements required for Rt shoulder to improve in functional activity.   OBJECTIVE  IMPAIRMENTS: decreased activity tolerance, decreased balance, decreased coordination, decreased endurance, decreased mobility, difficulty walking, decreased ROM, decreased strength, increased fascial restrictions, impaired perceived functional ability, increased muscle spasms, impaired flexibility, improper body mechanics, and pain.    ACTIVITY LIMITATIONS: carrying, lifting, bending, standing, squatting, stairs, transfers, and locomotion level   PARTICIPATION LIMITATIONS: cleaning, interpersonal relationship, community activity, occupation, yard work, and exercise routine, walking dog   PERSONAL FACTORS: Time since onset of injury/illness/exacerbation and multiple treatment areas, lupus  are also affecting patient's functional outcome.    REHAB POTENTIAL: Good   CLINICAL DECISION MAKING: Stable/uncomplicated   EVALUATION COMPLEXITY: Low     GOALS: Goals reviewed with patient? Yes   SHORT TERM GOALS: (target date for Short term goals are 3 weeks 08/16/2022)    1.  Patient will demonstrate independent use of home exercise program to maintain progress from in clinic treatments.   Goal status: Met 08/16/2022   LONG TERM GOALS: (target dates for all long term goals are 6 weeks  01/19/23 )   1. Patient will demonstrate/report pain at worst less than or equal to 2/10 to facilitate minimal limitation in daily activity secondary to pain symptoms.   Goal status: on going 12/13/2022   2. Patient will demonstrate independent use of home exercise program to facilitate ability to maintain/progress functional gains from skilled physical therapy services.   Goal status: on going, progressed today 12/13/2022   3. Patient will demonstrate FOTO outcome > or = 58 % to indicate reduced disability due to  condition.   Goal status:on going 12/13/2022   4.  Patient will demonstrate Lt LE MMT 5/5 , dynamometry within 15 % of Rt, Rt shoulder MMT 5/5 throughout to faciltiate usual transfers, stairs, squatting at  [facility] for daily life.    Goal status: on going 12/13/2022   5.  Patient will demonstrate Lt passive hamstring SLR to 90 deg equal to Rt s symptoms for normal mobility.  Goal status: on going 12/13/2022   6.  Patient will demonstrate bilateral SLS > 30 seconds to facilitate stability in ambulation on even and uneven ground.  Goal status:on going 12/13/2022   7.  Patient will demonstrate lumbar AROM WFL s symptoms to facilitate usual mobility at PLOF in standing/walking.  Goal Status: on going 12/13/2022   PLAN:   PT FREQUENCY: 1-2x/week   PT DURATION: 8 weeks   PLANNED INTERVENTIONS: Therapeutic exercises, Therapeutic activity, Neuro Muscular re-education, Balance training, Gait training, Patient/Family education, Joint mobilization, Stair training, DME instructions, Dry Needling, Electrical stimulation, Traction, Cryotherapy, vasopneumatic deviceMoist heat, Taping, Ultrasound, Ionotophoresis 4mg /ml Dexamethasone, and Manual therapy.  All included unless contraindicated   PLAN FOR NEXT SESSION: Dry needling as desired.  Progressive strengthening overall pending symptom response.   Will need to check on 30 visit limit (was it calendar year (total would be 11 for 2024, if not calendar year, eval note showed 19 visits remained )   Scot Jun, PT, DPT, OCS, ATC 12/13/22  8:40 AM

## 2022-12-14 ENCOUNTER — Encounter: Payer: Self-pay | Admitting: Sports Medicine

## 2022-12-14 ENCOUNTER — Ambulatory Visit: Payer: BC Managed Care – PPO | Admitting: Sports Medicine

## 2022-12-14 DIAGNOSIS — M25562 Pain in left knee: Secondary | ICD-10-CM

## 2022-12-14 DIAGNOSIS — M25462 Effusion, left knee: Secondary | ICD-10-CM | POA: Diagnosis not present

## 2022-12-14 DIAGNOSIS — S86112D Strain of other muscle(s) and tendon(s) of posterior muscle group at lower leg level, left leg, subsequent encounter: Secondary | ICD-10-CM

## 2022-12-14 DIAGNOSIS — F331 Major depressive disorder, recurrent, moderate: Secondary | ICD-10-CM | POA: Diagnosis not present

## 2022-12-14 NOTE — Progress Notes (Signed)
Robin [name] - 52 y.o. female MRN [id number]  Date of birth: 11/07/1970  Office Visit Note: Visit Date: 12/14/2022 PCP: Robin Hughs, NP Referred by: Robin Hughs, NP  Subjective: Chief Complaint  Patient presents with   Left Knee - Pain, Follow-up   HPI: Robin [name] is a pleasant 52 y.o. female who presents today for follow-up of left posterior knee pain.  MRI confirmed gastrocnemius strain.  We have been doing extracorporeal shockwave treatment therapy.  At this point she feels like she is between 95-97% improved.  Continues with some of her home rehab exercises, also doing formalized physical therapy for this and her shoulder with dry needling.  Continues on low-dose naltrexone.  Pertinent ROS were reviewed with the patient and found to be negative unless otherwise specified above in HPI.   Assessment & Plan: Visit Diagnoses:  1. Strain of gastrocnemius muscle of left lower extremity, subsequent encounter   2. Pain and swelling of left knee    Plan: Both Robin [name] and I are very pleased with the improvement she has made with the extracorporeal shockwave therapy as well as her home and formal PT. she is essentially at 100% improved and has not had reaccumulation of her Baker's cyst.  We will take a holiday from [place] and see how she does over the next month.  Continue therapy on her own.  May use ice or any OTC-anti inflammatories for pain control. Continue low-dose naltrexone.   In terms of her shoulder, she will have this discussion with Dr. Erlinda [name] regarding next steps.  Did discuss the role for prolotherapy injection vs PRP to reduce CS burden.  Follow-up: Return if symptoms worsen or fail to improve.   Meds & Orders: No orders of the defined types were placed in this encounter.  No orders of the defined types were placed in this encounter.    Procedures: Procedure: ECSWT Indications: Gastrocnemius tendinopathic changes   Procedure Details Consent: Risks of  procedure as well as the alternatives and risks of each were explained to the patient.  Verbal consent for procedure obtained. Time Out: Verified patient identification, verified procedure, site was marked, verified correct patient position. The area was cleaned with alcohol swab.     The left proximal gastroc, distal hamstring, and posterior knee was targeted for Extracorporeal shockwave therapy.    Preset: Status post muscular injury Power Level: 120 mJ  Frequency: 14 Hz Impulse/cycles: 3500 Head size: Regular   Patient tolerated procedure well without immediate complications        Clinical History: No specialty comments available.  She reports that she quit smoking about 21 years ago. Her smoking use included cigarettes. She has a 7.50 pack-year smoking history. She has never been exposed to tobacco smoke. She has never used smokeless tobacco. No results for input(s): "HGBA1C", "LABURIC" in the last 8760 hours.  Objective:    Physical Exam  Gen: Well-appearing, in no acute distress; non-toxic CV: Regular Rate. Well-perfused. Warm.  Resp: Breathing unlabored on room air; no wheezing. Psych: Fluid speech in conversation; appropriate affect; normal thought process Neuro: Sensation intact throughout. No gross coordination deficits.   Ortho Exam - Left knee: No TTP or swelling about the knee. Full range of motion about the knee in all directions.  No redness or ecchymosis.  Improved strength with hamstring and knee flexion.   Imaging: No results found.  Past Medical/Family/Surgical/Social History: Medications & Allergies reviewed per EMR, new medications updated. Patient Active Problem List  Diagnosis Date Noted   Left hamstring injury 07/15/2022   Labral tear of shoulder, degenerative, right 06/21/2022   Left carpal tunnel syndrome 05/26/2022   Foot sprain, left, initial encounter 05/19/2022   Baker's cyst of knee, left 04/21/2022   Right carpal tunnel syndrome 03/16/2022    Flushing 04/14/2021   Hypermobility syndrome 11/27/2020   Seasonal and perennial allergic rhinitis 11/06/2018   Mild intermittent asthma without complication [id number]   Flexural atopic dermatitis 11/06/2018   History of hyperlipidemia 11/08/2016   History of anxiety 11/08/2016   History of retinal tear 11/08/2016   History of Bilateral plantar fasciitis 08/10/2016   High risk medication use 07/04/2016   Lupus ([place]) 05/23/2016   Terminal ileitis 03/18/2016   ADHD (attention deficit hyperactivity disorder) 12/22/2014   Raynaud's phenomenon 10/17/2013   Hyperlipidemia 09/28/2011   Past Medical History:  Diagnosis Date   ADD (attention deficit disorder)    Allergy    Anemia    Anorexia nervosa with bulimia    Per [place] New Patient Packet   Anxiety    Asthma    Carpal tunnel syndrome    Per [place] New Patient Packet   Depression    Dry eyes    Per [place] New Patient Packet   Dry mouth    Per [place] New Patient Packet   Fibroids    Per [place] New Patient Packet   History of hip x-ray 2021   Per [place] New Patient Packet   History of Papanicolaou smear of cervix 2021   Per [place] New Patient Packet, Dr.Pickens   Hyperlipidemia    Ileitis    Lupus    PONV (postoperative nausea and vomiting)    mild nausea    Raynaud disease    Retinal tear    Per [facility] New Patient Packet   Systemic lupus erythematosus    Family History  Problem Relation Age of Onset   Heart disease Mother 64       CAD/stenting   Hyperlipidemia Mother    Arthritis Mother        ostearthritis   Hypertension Mother    Kidney disease Mother    Irritable bowel syndrome Mother    Stroke Mother 60       CVA   Fibromyalgia Mother    Eczema Mother    Sjogren's syndrome Mother    Hyperlipidemia Father    Neuropathy Father    Allergies Father        shellfish   Prostatitis Father    Hyperlipidemia Sister    ADD / ADHD Sister    Bipolar disorder Sister    Depression Sister    Hypertension Brother    OCD Brother     Anxiety disorder Daughter    ADD / ADHD Daughter    Asthma Daughter    OCD Son    Colon cancer Neg Hx    Inflammatory bowel disease Neg Hx    Allergic rhinitis Neg Hx    Angioedema Neg Hx    Atopy Neg Hx    Immunodeficiency Neg Hx    Urticaria Neg Hx    Past Surgical History:  Procedure Laterality Date   CARPAL TUNNEL RELEASE Right    08/2019   CESAREAN SECTION  2003   CESAREAN SECTION  2005   Per [facility] New Patient Packet   COLONOSCOPY  2017   Dr.Jacobs   cyst drained Left 2023   knee   DENTAL SURGERY  2009   Fromed tooth and chronic  infection   DILITATION & CURRETTAGE/HYSTROSCOPY WITH NOVASURE ABLATION N/A 06/03/2020   Procedure: DILATATION & CURETTAGE/HYSTEROSCOPY WITH NOVASURE ABLATION;  Surgeon: Aletha Halim, MD;  Location: [place];  Service: Gynecology;  Laterality: N/A;   ENDOMETRIAL BIOPSY  04/20/2020       EYE SURGERY Bilateral    cryoretinopexy   Social History   Occupational History   Not on file  Tobacco Use   Smoking status: Former    Packs/day: 0.75    Years: 10.00    Additional pack years: 0.00    Total pack years: 7.50    Types: Cigarettes    Quit date: 07/05/2001    Years since quitting: 21.4    Passive exposure: Never   Smokeless tobacco: Never  Vaping Use   Vaping Use: Never used  Substance and Sexual Activity   Alcohol use: Yes    Comment: occ   Drug use: Never   Sexual activity: Yes    Birth control/protection: Condom

## 2022-12-15 ENCOUNTER — Ambulatory Visit: Payer: BC Managed Care – PPO | Admitting: Rehabilitative and Restorative Service Providers"

## 2022-12-15 ENCOUNTER — Encounter: Payer: Self-pay | Admitting: Rehabilitative and Restorative Service Providers"

## 2022-12-15 DIAGNOSIS — R262 Difficulty in walking, not elsewhere classified: Secondary | ICD-10-CM

## 2022-12-15 DIAGNOSIS — M79605 Pain in left leg: Secondary | ICD-10-CM

## 2022-12-15 DIAGNOSIS — G901 Familial dysautonomia [Riley-Day]: Secondary | ICD-10-CM | POA: Diagnosis not present

## 2022-12-15 DIAGNOSIS — G8929 Other chronic pain: Secondary | ICD-10-CM

## 2022-12-15 DIAGNOSIS — M25511 Pain in right shoulder: Secondary | ICD-10-CM

## 2022-12-15 DIAGNOSIS — M6281 Muscle weakness (generalized): Secondary | ICD-10-CM

## 2022-12-15 DIAGNOSIS — R55 Syncope and collapse: Secondary | ICD-10-CM | POA: Diagnosis not present

## 2022-12-15 DIAGNOSIS — R000 Tachycardia, unspecified: Secondary | ICD-10-CM | POA: Diagnosis not present

## 2022-12-15 NOTE — Therapy (Addendum)
OUTPATIENT PHYSICAL THERAPY TREATMENT NOTE /DISCHARGE   Patient Name: Robin [name] MRN: [id number] DOB:1971/06/28, 52 y.o., female Today's Date: 12/15/2022    END OF SESSION:   PT End of Session - 12/15/22 0804     Visit Number 19    Number of Visits 28    Date for PT Re-Evaluation 01/19/23    Authorization Type [employer] $[id number] copay    Authorization - Visit Number 11    Authorization - Number of Visits 30    PT Start Time 0804    PT Stop Time 0840    PT Time Calculation (min) 36 min    Activity Tolerance Patient limited by pain    Behavior During Therapy WFL for tasks assessed/performed                  Past Medical History:  Diagnosis Date   ADD (attention deficit disorder)    Allergy    Anemia    Anorexia nervosa with bulimia    Per [facility] New Patient Packet   Anxiety    Asthma    Carpal tunnel syndrome    Per [facility] New Patient Packet   Depression    Dry eyes    Per [facility] New Patient Packet   Dry mouth    Per [facility] New Patient Packet   Fibroids    Per [facility] New Patient Packet   History of hip x-ray 2021   Per [facility] New Patient Packet   History of Papanicolaou smear of cervix 2021   Per [facility] New Patient Packet, Dr.Pickens   Hyperlipidemia    Ileitis    Lupus    PONV (postoperative nausea and vomiting)    mild nausea    Raynaud disease    Retinal tear    Per [facility] New Patient Packet   Systemic lupus erythematosus    Past Surgical History:  Procedure Laterality Date   CARPAL TUNNEL RELEASE Right    08/2019   CESAREAN SECTION  2003   CESAREAN SECTION  2005   Per [facility] New Patient Packet   COLONOSCOPY  2017   Dr.Jacobs   cyst drained Left 2023   knee   DENTAL SURGERY  2009   Fromed tooth and chronic infection   DILITATION & CURRETTAGE/HYSTROSCOPY WITH NOVASURE ABLATION N/A 06/03/2020   Procedure: DILATATION & CURETTAGE/HYSTEROSCOPY WITH NOVASURE ABLATION;  Surgeon: Acworth Bing, MD;  Location: [name];  Service: Gynecology;  Laterality:  N/A;   ENDOMETRIAL BIOPSY  04/20/2020       EYE SURGERY Bilateral    cryoretinopexy   Patient Active Problem List   Diagnosis Date Noted   Left hamstring injury 07/15/2022   Labral tear of shoulder, degenerative, right 06/21/2022   Left carpal tunnel syndrome 05/26/2022   Foot sprain, left, initial encounter 05/19/2022   Baker's cyst of knee, left 04/21/2022   Right carpal tunnel syndrome 03/16/2022   Flushing 04/14/2021   Hypermobility syndrome 11/27/2020   Seasonal and perennial allergic rhinitis 11/06/2018   Mild intermittent asthma without complication 11/06/2018   Flexural atopic dermatitis 11/06/2018   History of hyperlipidemia 11/08/2016   History of anxiety 11/08/2016   History of retinal tear 11/08/2016   History of Bilateral plantar fasciitis 08/10/2016   High risk medication use 07/04/2016   Lupus (HCC) 05/23/2016   Terminal ileitis 03/18/2016   ADHD (attention deficit hyperactivity disorder) 12/22/2014   Raynaud's phenomenon 10/17/2013   Hyperlipidemia 09/28/2011     THERAPY DIAG:  Chronic right shoulder pain  Pain in left leg  Muscle weakness (generalized)  Difficulty in walking, not elsewhere classified  PCP: Ngetich, Donalee Citrin NP   REFERRING PROVIDER: Tarry Kos, MD   REFERRING DIAG: S76.302A (ICD-10-CM) - Left hamstring injury, initial encounter    Rationale for Evaluation and Treatment: Rehabilitation   ONSET DATE: Around 07/13/2022 for hamstring, several years for other complaints of Rt shoulder pain and back pain   SUBJECTIVE:    SUBJECTIVE STATEMENT: Shadiamond indicated that last visit and needling was helpful and "felt looser."  Deferred needing today due to improvement feeling.  Reported 0/10 upon arrival and 5/10 at worst in last day or so.    PERTINENT HISTORY: Lt hamstring tear grade 1. Bakers Cyst per MD note.  Med history : lupus  NEW MRI Rt shoulder IMPRESSION: 1. Moderate tendinosis of the supraspinatus tendon with  a partial-thickness bursal surface tear and a partial-thickness articular surface tear. 2. Mild tendinosis of the infraspinatus tendon. 3. Mild subacromial/subdeltoid bursitis.   PAIN:  Pain location:  5/10 Rt shoulder at worst, 0/10 upon arrival Description:  tenderness, tightness, spasms Aggravating factors: Rt shoulder: reaching upward /outward Relieving factors: taping, stretching, previous treatment, injection helped   PRECAUTIONS: None   WEIGHT BEARING RESTRICTIONS: No   FALLS:  Has patient fallen in last 6 months? Yes 2x - Outside of house Pt indicated a fear of falling.     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  Heal hamstring completely     OBJECTIVE:     PATIENT SURVEYS:  12/15/2022: FOTO update: 59  11/08/2022:  FOTO update 51  08/16/2022: FOTO update 49  07/26/2022 FOTO intake:31 predicted:  58   COGNITION: 07/26/2022 Overall cognitive status: WFL                        SENSATION: 07/26/2022 WFL   MUSCLE LENGTH: 07/26/2022 Hamstrings passive SLR supine: Right 90 deg; Left 70 deg c pain   POSTURE:  07/26/2022 unremarkable in standing   PALPATION: 07/26/2022 Tenderness noted to light touch in Lt lumbar paraspinals   ROM:    ROM 07/26/2022 Right 07/26/2022 Left 07/26/2022 08/09/2022 Right 08/23/22  Shoulder flexion        WNL  Shoulder abduction        WNL  Shoulder internal rotation        WNL  Shoulder external rotation        WNL         Knee flexion          Knee extension                     Lumbar extension 75% WFL c end range back complaints   Repeated x 5 in standing c ERP worsened     75% WFL c tightness lower back (more on Lt)   Lumbar flexion  To floor no complaints     Hands flat to floor               (Blank rows = not tested)   UPPER EXTREMITY ROM:  ROM Right eval Left eval   Shoulder flexion  WFL with painful arc in sitting and supine   Shoulder  extension     Shoulder abduction     Shoulder adduction     Shoulder extension     Shoulder internal rotation  60 AROM in 45 deg abduction   Shoulder external rotation  80 AROM in 45 deg abduction    Elbow flexion     Elbow extension     Wrist flexion          Lower trap     Middle trap                (Blank rows = not tested)    LOWER EXTREMITY MMT:   MMT Right 07/26/2022 Left 07/26/2022 Left 07/28/22 Left 08/09/2022 Left 1/31/204  Hip flexion 5/5 5/5     Hip extension Check next visit Check next visit 4/5    Hip abduction Check next visit Check next visit 4/5    Hip adduction         Hip internal rotation         Hip external rotation         Knee flexion 5/5   39, 36.4 lbs   3+/5 14 lbs With  pain   4/5 20.6, 18.4 lbs 4/5 26, 23 lbs  Knee extension 5/5 5/5     Ankle dorsiflexion 5/5 5/5     Ankle plantarflexion         Ankle inversion         Ankle eversion          (Blank rows = not tested)  UPPER EXTREMITY MMT:  MMT Right 08/23/22 Right 10/12/2021 Right 11/02/2022 Left 11/02/2022 Right 11/25/2022 Left 11/25/2022  Shoulder flexion 5 5/5 c pain   4+/5 c mild pain 5/5  Shoulder extension        Shoulder abduction 4 3+/5 c pain   4/5 c pain 5/5  Shoulder adduction        Shoulder extension        Shoulder internal rotation 4 5/5      Shoulder external rotation 4- 4/5 c pain      Middle trapezius   4/5 4/5 5/5 5/5  Lower trapezius   4/5 4/5 4+/5 c mild pain 5/5  Elbow flexion        Elbow extension        Wrist flexion        Wrist extension        Wrist ulnar deviation        Wrist radial deviation        Wrist pronation        Wrist supination        Grip strength         (Blank rows = not tested)    SPECIAL TESTS:   07/26/2022 Lt slump test positive for complaints in thigh and calf, Negative on Rt Negative crossed slr bilateral for any radicular pain   FUNCTIONAL TESTS:  08/16/2022:  Lt SLS:  18 seconds  07/26/2022 18 inch chair transfer:  1st try without hands Lt SLS:  10 seconds, stopped due to back related complaints Rt SLS:  30 seconds c mild aberrant movement   GAIT: 07/26/2022 Independent ambulation      TODAY'S TREATMENT  12/15/2022: Therex UBE UE/LE ROM 4 mins fwd lvl 4.0 , 4 mins backward UE only  lvl 3.0 Prone scapular retraction c GH ext hold 5 sec x 10 1 lbs weight  Prone scapular retraction c horizontal abduction 5 sec hold x 10 1 lbs  weight  Prone y lift 5 sec hold x 10 Prone on elbows serratus anterior hold 5 sec x 10 Standing green band ER c towel under arm eccentric focus x 10 Rt (pain noted in motion so stopped)      12/13/2022: Therex Rt upper limb neural tension glides X 10 each for median, radial, ulnar.  Review for home use.  UBE UE/LE ROM 2 mins fwd, 2 mins backward lvl 3.0 Standing red band ER to neutral x 10, ER c flexion punch 2 x 10 , performed bilaterally  Standing blue band gh ext past legs 3 sec hold x 15  Leg curl machine double leg down, single leg eccentrics Lt leg 35 lbs 3 x 10   Manual: Compression to Rt shoulder and skilled palpation with DN   Trigger Point Dry-Needling  Treatment instructions: Expect mild to moderate muscle soreness. S/S of pneumothorax if dry needled over a lung field, and to seek immediate medical attention should they occur. Patient verbalized understanding of these instructions and education.  Patient Consent Given: Yes Education handout provided: Previously provided Muscles treated: Rt infraspinatus, bilateral upper trap Treatment response/outcome: local twitch response noted , no adverse reaction  12/06/2022: Therex Rt upper limb neural tension glides X 10 each for median, radial, ulnar, performed bilat as she felt she  needed to Standing bilat ER 2X 10 red band (did isometric hold on Rt UE to reduce pain with this) Standing blue band bilateral GH ext and rows x 20 each  Manual: Compression to Rt shoulder and skilled palpation with DN   Trigger Point Dry-Needling  Treatment instructions: Expect mild to moderate muscle soreness. S/S of pneumothorax if dry needled over a lung field, and to seek immediate medical attention should they occur. Patient verbalized understanding of these instructions and education.  Patient Consent Given: Yes Education handout provided: Previously provided Muscles treated: Rt infraspinatus, Rt upper trap, Rt deltoid Treatment response/outcome: local twitch response noted , no adverse reaction  11/25/2022: Therex UBE UE/ LE 8 mins fwd lvl 4.0 Cross arm stretch Rt 15 sec x 3 Rt upper trap stretch 15 sec x 3 Standing green band shoulder ER c towel under elbow 2 x 15 Standing blue band bilateral GH ext x 20  Manual: Compression to Rt infraspinatus, Rt upper trap.  Ktaping to Rt shoulder (Vertical anterior and posterior semicircle strips from lateral upper arm to upper trap as well as strip horizontal from anterior to posterior shoulder across lateral upper arm. )   Trigger Point Dry-Needling  Treatment instructions: Expect mild to moderate muscle soreness. S/S of pneumothorax if dry needled over a lung field, and to seek immediate medical attention should they occur. Patient verbalized understanding of these instructions and education.  Patient Consent Given: Yes Education handout provided: Previously provided Muscles treated: Rt infraspinatus, Rt upper trap  Treatment response/outcome: local twitch response noted , no adverse reaction     PATIENT EDUCATION:   10/12/2022 Education details: HEP update Person educated: Patient Education method: Programmer, multimedia, Demonstration, Verbal cues, and Handouts Education comprehension: verbalized understanding, returned demonstration,  and verbal cues required   HOME EXERCISE PROGRAM: Access Code: [email or web address] URL: [email or web address] Date: 12/08/2022 Prepared by: Ivery Quale  Exercises - Supine 90/90 Sciatic Nerve Glide with Knee Flexion/Extension  - 1-2 x daily - 7 x weekly - 1-2 sets - 10 reps - Sidelying Hip Abduction  - 1-2 x daily - 7 x weekly - 2-3 sets - 10-15 reps - Standing Hip Hiking  - 1-2  x daily - 7 x weekly - 1 sets - 10 reps - 5 hold - Seated Hamstring Set (Mirrored)  - 2-3 x daily - 7 x weekly - 1 sets - 10-15 reps - 5-10 hold - Prone Alternating Arm and Leg Lifts  - 1-2 x daily - 7 x weekly - 1-2 sets - 10 reps - 3 hold - Sidelying Lumbar Rotation Stretch (Mirrored)  - 2-3 x daily - 7 x weekly - 1 sets - 5 reps - 15 hold - Standing Isometric Shoulder Internal Rotation at Doorway  - 2 x daily - 6 x weekly - 1-2 sets - 10 reps - 5 hold - Standing Isometric Shoulder External Rotation with Doorway (Mirrored)  - 2 x daily - 6 x weekly - 1-2 sets - 10 reps - 5 hold - Standing Isometric Shoulder Flexion with Doorway - Arm Bent  - 2 x daily - 6 x weekly - 1-2 sets - 10 reps - 5 hold - Standing Isometric Shoulder Abduction with Doorway - Arm Bent (Mirrored)  - 2 x daily - 6 x weekly - 1-2 sets - 10 reps - 5 hold - Sidelying Shoulder External Rotation (Mirrored)  - 1-2 x daily - 7 x weekly - 2-3 sets - 10-15 reps - Standing Shoulder Posterior Capsule Stretch (Mirrored)  - 2-3 x daily - 7 x weekly - 1 sets - 5 reps - 15-30 hold - Prone Single Arm Shoulder Y  - 2 x daily - 6 x weekly - 2-3 sets - 10 reps - 3 sec hold - Prone Shoulder Horizontal Abduction  - 2 x daily - 3-4 x weekly - 2-3 sets - 10 reps - 3 sec hold - Prone Shoulder Extension - Single Arm  - 2 x daily - 3-4 x weekly - 2-3 sets - 10 reps - 3 sec hold - Bridge Walk Out  - 2 x daily - 6 x weekly - 2 sets - 10 reps - Single-Leg [place] Deadlift With Dumbbell  - 2 x daily - 6 x weekly - 1-2 sets - 10 reps - Standing Hamstring Curl with  Resistance  - 2 x daily - 6 x weekly - 1-2 sets - 10 reps   ASSESSMENT:   CLINICAL IMPRESSION: Focused primarily on progressive strengthening and scapular control in visit today.  Symptoms, particularly stab/sharp pain at times in rotational movement were noted and caused adaptation and stopping in activity in clinic at times.  Recommended return to MD discussion about Rt shoulder symptoms due to overall lack of functional improvement of symptoms.  Pt was overall similar in Rt shoulder symptoms and function. Limits in activity and strengthening due to symptoms have been noted.   OBJECTIVE IMPAIRMENTS: decreased activity tolerance, decreased balance, decreased coordination, decreased endurance, decreased mobility, difficulty walking, decreased ROM, decreased strength, increased fascial restrictions, impaired perceived functional ability, increased muscle spasms, impaired flexibility, improper body mechanics, and pain.    ACTIVITY LIMITATIONS: carrying, lifting, bending, standing, squatting, stairs, transfers, and locomotion level   PARTICIPATION LIMITATIONS: cleaning, interpersonal relationship, community activity, occupation, yard work, and exercise routine, walking dog   PERSONAL FACTORS: Time since onset of injury/illness/exacerbation and multiple treatment areas, lupus  are also affecting patient's functional outcome.    REHAB POTENTIAL: Good   CLINICAL DECISION MAKING: Stable/uncomplicated   EVALUATION COMPLEXITY: Low     GOALS: Goals reviewed with patient? Yes   SHORT TERM GOALS: (target date for Short term goals are 3 weeks 08/16/2022)    1.  Patient will demonstrate independent use of home exercise program to maintain progress from in clinic treatments.   Goal status: Met 08/16/2022   LONG TERM GOALS: (target dates for all long term goals are 6 weeks  01/19/23 )   1. Patient will demonstrate/report pain at worst less than or equal to 2/10 to facilitate minimal limitation in daily  activity secondary to pain symptoms.   Goal status: on going 12/13/2022   2. Patient will demonstrate independent use of home exercise program to facilitate ability to maintain/progress functional gains from skilled physical therapy services.   Goal status: on going, progressed today 12/13/2022   3. Patient will demonstrate FOTO outcome > or = 58 % to indicate reduced disability due to condition.   Goal status:on going 12/13/2022   4.  Patient will demonstrate Lt LE MMT 5/5 , dynamometry within 15 % of Rt, Rt shoulder MMT 5/5 throughout to faciltiate usual transfers, stairs, squatting at [facility] for daily life.    Goal status: on going 12/13/2022   5.  Patient will demonstrate Lt passive hamstring SLR to 90 deg equal to Rt s symptoms for normal mobility.  Goal status: on going 12/13/2022   6.  Patient will demonstrate bilateral SLS > 30 seconds to facilitate stability in ambulation on even and uneven ground.  Goal status:on going 12/13/2022   7.  Patient will demonstrate lumbar AROM WFL s symptoms to facilitate usual mobility at PLOF in standing/walking.  Goal Status: on going 12/13/2022   PLAN:   PT FREQUENCY: 1-2x/week   PT DURATION: 8 weeks   PLANNED INTERVENTIONS: Therapeutic exercises, Therapeutic activity, Neuro Muscular re-education, Balance training, Gait training, Patient/Family education, Joint mobilization, Stair training, DME instructions, Dry Needling, Electrical stimulation, Traction, Cryotherapy, vasopneumatic deviceMoist heat, Taping, Ultrasound, Ionotophoresis 4mg /ml Dexamethasone, and Manual therapy.  All included unless contraindicated   PLAN FOR NEXT SESSION: Hold until MD office follow up  Total is visit 11 of 30 remaining per [facility]   Chyrel Masson, PT, DPT, OCS, ATC 12/15/22  8:42 AM   PHYSICAL THERAPY DISCHARGE SUMMARY  Visits from Start of Care: 19  Current functional level related to goals / functional outcomes: See note   Remaining deficits: See note    Education / Equipment: HEP  Patient goals were partially met. Patient is being discharged due to not returning since the last visit.  Chyrel Masson, PT, DPT, OCS, ATC 01/16/23  1:27 PM

## 2022-12-21 ENCOUNTER — Encounter: Payer: BC Managed Care – PPO | Admitting: Physical Therapy

## 2022-12-21 ENCOUNTER — Ambulatory Visit: Payer: BC Managed Care – PPO | Admitting: Sports Medicine

## 2022-12-21 DIAGNOSIS — R000 Tachycardia, unspecified: Secondary | ICD-10-CM | POA: Diagnosis not present

## 2022-12-21 DIAGNOSIS — R55 Syncope and collapse: Secondary | ICD-10-CM | POA: Diagnosis not present

## 2022-12-22 ENCOUNTER — Ambulatory Visit: Payer: BC Managed Care – PPO | Admitting: Orthopaedic Surgery

## 2022-12-22 DIAGNOSIS — G5602 Carpal tunnel syndrome, left upper limb: Secondary | ICD-10-CM | POA: Diagnosis not present

## 2022-12-22 DIAGNOSIS — M75111 Incomplete rotator cuff tear or rupture of right shoulder, not specified as traumatic: Secondary | ICD-10-CM | POA: Diagnosis not present

## 2022-12-22 MED ORDER — LIDOCAINE HCL 1 % IJ SOLN
1.0000 mL | INTRAMUSCULAR | Status: AC | PRN
Start: 2022-12-22 — End: 2022-12-22
  Administered 2022-12-22: 1 mL

## 2022-12-22 MED ORDER — BUPIVACAINE HCL 0.5 % IJ SOLN
1.0000 mL | INTRAMUSCULAR | Status: AC | PRN
Start: 2022-12-22 — End: 2022-12-22
  Administered 2022-12-22: 1 mL

## 2022-12-22 MED ORDER — METHYLPREDNISOLONE ACETATE 40 MG/ML IJ SUSP
40.0000 mg | INTRAMUSCULAR | Status: AC | PRN
Start: 2022-12-22 — End: 2022-12-22
  Administered 2022-12-22: 40 mg

## 2022-12-22 NOTE — Progress Notes (Signed)
Office Visit Note   Patient: Robin [name]           Date of Birth: 1971/01/15           MRN: [id number] Visit Date: 12/22/2022              Requested by: Robin Bookman, NP [address] [place],  [place] [address] PCP: Robin Bookman, NP   Assessment & Plan: Visit Diagnoses:  1. Partial nontraumatic tear of right rotator cuff   2. Left carpal tunnel syndrome     Plan: Impression is 52 year old female with continued right shoulder pain due to partial rotator cuff tear and left moderate to severe carpal tunnel syndrome.  Treatment options were again discussed with the patient in detail.  She will think about her options and for now she is going to try prolotherapy for the shoulder.  We reinjected the left carpal tunnel today.  Will see her back as needed  Follow-Up Instructions: No follow-ups on file.   Orders:  No orders of the defined types were placed in this encounter.  No orders of the defined types were placed in this encounter.     Procedures: Hand/UE Inj: L carpal tunnel for carpal tunnel syndrome on 12/22/2022 8:46 AM Details: 25 G needle Medications: 1 mL lidocaine 1 %; 1 mL bupivacaine 0.5 %; 40 mg methylPREDNISolone acetate 40 MG/ML Outcome: tolerated well, no immediate complications Patient was prepped and draped in the usual sterile fashion.       Clinical Data: No additional findings.   Subjective: Chief Complaint  Patient presents with   Right Shoulder - Pain    HPI  Robin [name] returns to me for follow-up on right shoulder pain and left carpal tunnel syndrome.  Review of Systems  Constitutional: Negative.   HENT: Negative.    Eyes: Negative.   Respiratory: Negative.    Cardiovascular: Negative.   Endocrine: Negative.   Musculoskeletal: Negative.   Neurological: Negative.   Hematological: Negative.   Psychiatric/Behavioral: Negative.    All other systems reviewed and are negative.    Objective: Vital Signs: There were no vitals  taken for this visit.  Physical Exam Vitals and nursing note reviewed.  Constitutional:      Appearance: She is well-developed.  HENT:     Head: Normocephalic and atraumatic.  Pulmonary:     Effort: Pulmonary effort is normal.  Abdominal:     Palpations: Abdomen is soft.  Musculoskeletal:     Cervical back: Neck supple.  Skin:    General: Skin is warm.     Capillary Refill: Capillary refill takes less than 2 seconds.  Neurological:     Mental Status: She is alert and oriented to person, place, and time.  Psychiatric:        Behavior: Behavior normal.        Thought Content: Thought content normal.        Judgment: Judgment normal.     Ortho Exam  Examination of both the right shoulder and left hand are unchanged.  Specialty Comments:  No specialty comments available.  Imaging: No results found.   PMFS History: Patient Active Problem List   Diagnosis Date Noted   Partial nontraumatic tear of right rotator cuff 12/22/2022   Left hamstring injury 07/15/2022   Labral tear of shoulder, degenerative, right 06/21/2022   Left carpal tunnel syndrome 05/26/2022   Foot sprain, left, initial encounter 05/19/2022   Baker's cyst of knee, left 04/21/2022  Right carpal tunnel syndrome 03/16/2022   Flushing 04/14/2021   Hypermobility syndrome 11/27/2020   Seasonal and perennial allergic rhinitis 11/06/2018   Mild intermittent asthma without complication 11/06/2018   Flexural atopic dermatitis 11/06/2018   History of hyperlipidemia 11/08/2016   History of anxiety 11/08/2016   History of retinal tear 11/08/2016   History of Bilateral plantar fasciitis 08/10/2016   High risk medication use 07/04/2016   Lupus (HCC) 05/23/2016   Terminal ileitis 03/18/2016   ADHD (attention deficit hyperactivity disorder) 12/22/2014   Raynaud's phenomenon 10/17/2013   Hyperlipidemia 09/28/2011   Past Medical History:  Diagnosis Date   ADD (attention deficit disorder)    Allergy    Anemia     Anorexia nervosa with bulimia    Per [facility] New Patient Packet   Anxiety    Asthma    Carpal tunnel syndrome    Per [facility] New Patient Packet   Depression    Dry eyes    Per [facility] New Patient Packet   Dry mouth    Per [facility] New Patient Packet   Fibroids    Per [facility] New Patient Packet   History of hip x-ray 2021   Per [facility] New Patient Packet   History of Papanicolaou smear of cervix 2021   Per [facility] New Patient Packet, Dr.Pickens   Hyperlipidemia    Ileitis    Lupus    PONV (postoperative nausea and vomiting)    mild nausea    Raynaud disease    Retinal tear    Per [facility] New Patient Packet   Systemic lupus erythematosus     Family History  Problem Relation Age of Onset   Heart disease Mother 38       CAD/stenting   Hyperlipidemia Mother    Arthritis Mother        ostearthritis   Hypertension Mother    Kidney disease Mother    Irritable bowel syndrome Mother    Stroke Mother 17       CVA   Fibromyalgia Mother    Eczema Mother    Sjogren's syndrome Mother    Hyperlipidemia Father    Neuropathy Father    Allergies Father        shellfish   Prostatitis Father    Hyperlipidemia Sister    ADD / ADHD Sister    Bipolar disorder Sister    Depression Sister    Hypertension Brother    OCD Brother    Anxiety disorder Daughter    ADD / ADHD Daughter    Asthma Daughter    OCD Son    Colon cancer Neg Hx    Inflammatory bowel disease Neg Hx    Allergic rhinitis Neg Hx    Angioedema Neg Hx    Atopy Neg Hx    Immunodeficiency Neg Hx    Urticaria Neg Hx     Past Surgical History:  Procedure Laterality Date   CARPAL TUNNEL RELEASE Right    08/2019   CESAREAN SECTION  2003   CESAREAN SECTION  2005   Per [facility] New Patient Packet   COLONOSCOPY  2017   Dr.Jacobs   cyst drained Left 2023   knee   DENTAL SURGERY  2009   Fromed tooth and chronic infection   DILITATION & CURRETTAGE/HYSTROSCOPY WITH NOVASURE ABLATION N/A 06/03/2020   Procedure: DILATATION & CURETTAGE/HYSTEROSCOPY  WITH NOVASURE ABLATION;  Surgeon: [place] Bing, MD;  Location: [facility];  Service: Gynecology;  Laterality: N/A;   ENDOMETRIAL BIOPSY  04/20/2020       EYE SURGERY Bilateral    cryoretinopexy   Social History   Occupational History   Not on file  Tobacco Use   Smoking status: Former    Packs/day: 0.75    Years: 10.00    Additional pack years: 0.00    Total pack years: 7.50    Types: Cigarettes    Quit date: 07/05/2001    Years since quitting: 21.4    Passive exposure: Never   Smokeless tobacco: Never  Vaping Use   Vaping Use: Never used  Substance and Sexual Activity   Alcohol use: Yes    Comment: occ   Drug use: Never   Sexual activity: Yes    Birth control/protection: Condom

## 2022-12-23 ENCOUNTER — Encounter: Payer: BC Managed Care – PPO | Admitting: Rehabilitative and Restorative Service Providers"

## 2022-12-26 ENCOUNTER — Encounter: Payer: Self-pay | Admitting: *Deleted

## 2022-12-28 ENCOUNTER — Other Ambulatory Visit: Payer: Self-pay | Admitting: Nurse Practitioner

## 2022-12-28 ENCOUNTER — Encounter: Payer: BC Managed Care – PPO | Admitting: Physical Therapy

## 2022-12-28 DIAGNOSIS — F331 Major depressive disorder, recurrent, moderate: Secondary | ICD-10-CM | POA: Diagnosis not present

## 2022-12-30 ENCOUNTER — Encounter: Payer: BC Managed Care – PPO | Admitting: Rehabilitative and Restorative Service Providers"

## 2023-01-03 ENCOUNTER — Telehealth: Payer: Self-pay | Admitting: Allergy & Immunology

## 2023-01-03 NOTE — Telephone Encounter (Signed)
Pharmacy was calling talk with a nurse about Jaynell Colberg meds with dr gallagher . [phone].

## 2023-01-04 ENCOUNTER — Encounter: Payer: BC Managed Care – PPO | Admitting: Physical Therapy

## 2023-01-04 ENCOUNTER — Telehealth: Payer: Self-pay | Admitting: Orthopaedic Surgery

## 2023-01-04 NOTE — Telephone Encounter (Signed)
Patient called and left message regarding scheduling surgery.  I do have an old surgery order from September of 2023 for left carpal tunnel release, however she mentioned the shoulder.  I returned patient's call, but it went to voicemail.  Please provide surgery sheet if surgery is in order. Thanks.

## 2023-01-04 NOTE — Telephone Encounter (Signed)
I tried [facility] [facility] but was unable to get through as they close at 6. I left a message to give the office a callback.

## 2023-01-06 ENCOUNTER — Encounter: Payer: BC Managed Care – PPO | Admitting: Rehabilitative and Restorative Service Providers"

## 2023-01-09 ENCOUNTER — Telehealth: Payer: Self-pay | Admitting: Orthopaedic Surgery

## 2023-01-09 NOTE — Telephone Encounter (Signed)
[facility] called about medication cromolyn cromolyn (GASTROCROM) 100 MG/5ML solution and requested a call back at [phone]

## 2023-01-09 NOTE — Telephone Encounter (Signed)
Called patient to scheduled right shoulder scope with Dr. Roda Shutters.  No answer.  Left message on voicemail providing name and direct number for scheduling.

## 2023-01-09 NOTE — Telephone Encounter (Signed)
Returned pharmacy call, they are wondering if patient is needing the liquid or can they dispense pills. A PA would need to be done for the liquid but not for the pills. They are also wanting clarification with dosage as previous prescription states to take 10 mg 4 times daily before meals and the liquid comes in 100mg /86ml. Please advise.

## 2023-01-10 ENCOUNTER — Telehealth: Payer: Self-pay | Admitting: Allergy & Immunology

## 2023-01-10 NOTE — Telephone Encounter (Addendum)
Please refer to 01/03/23 telephone encounter!

## 2023-01-10 NOTE — Telephone Encounter (Signed)
[facility] states they need verification of directions for cromolyn and a  medical reason to compound solution  call back number [phone]- Ester( can speak to any pharmacist)

## 2023-01-10 NOTE — Telephone Encounter (Signed)
I sent an email to the patient to clarify her dosing.   Malachi Bonds, MD [facility]

## 2023-01-11 ENCOUNTER — Encounter: Payer: BC Managed Care – PPO | Admitting: Physical Therapy

## 2023-01-13 ENCOUNTER — Encounter: Payer: BC Managed Care – PPO | Admitting: Rehabilitative and Restorative Service Providers"

## 2023-01-13 NOTE — Telephone Encounter (Signed)
Left a message for patient to call the office in regards to this matter. 

## 2023-01-17 DIAGNOSIS — F331 Major depressive disorder, recurrent, moderate: Secondary | ICD-10-CM | POA: Diagnosis not present

## 2023-01-23 ENCOUNTER — Telehealth: Payer: Self-pay | Admitting: Allergy & Immunology

## 2023-01-23 NOTE — Telephone Encounter (Signed)
Patient is returning a call that she got about 2 weeks ago.

## 2023-01-24 NOTE — Telephone Encounter (Signed)
Patient called regarding my call from 2 weeks ago, she stated that she is just going to email Dr. Dellis Anes back and go from there.

## 2023-01-24 NOTE — Telephone Encounter (Addendum)
Patient called regarding my call from 2 weeks ago, she stated that she is just going to email Dr. Dellis Anes back and go from there. See telephone encounter from 01/03/2023 for more information.

## 2023-01-25 NOTE — Telephone Encounter (Signed)
Yes I forwarded the email to you. Let's start with 25 mg four times daily for one month. We can see how she is doing at that time before sending in more.  Malachi Bonds, MD [facility]

## 2023-01-26 MED ORDER — CROMOLYN SODIUM 100 MG/5ML PO CONC: 25.0000 mg | Freq: Four times a day (QID) | ORAL | 0 refills | Status: DC

## 2023-01-26 NOTE — Telephone Encounter (Signed)
New prescription has been sent in and patient has been made aware via email and voicemail. I called the pharmacy and informed them of the change. I was informed that a PA is not needed however they just needed to know why she could take the pill form. I informed him that patient stated it has fewer additives and she tolerates it better than the pills. He stated that is all he needed and would get this filled.

## 2023-01-26 NOTE — Addendum Note (Signed)
Addended by: Dub Mikes on: 01/26/2023 09:28 AM   Modules accepted: Orders

## 2023-01-27 ENCOUNTER — Ambulatory Visit: Payer: BC Managed Care – PPO | Admitting: Podiatry

## 2023-01-27 ENCOUNTER — Encounter: Payer: Self-pay | Admitting: Podiatry

## 2023-01-27 DIAGNOSIS — R52 Pain, unspecified: Secondary | ICD-10-CM | POA: Diagnosis not present

## 2023-01-27 DIAGNOSIS — M205X9 Other deformities of toe(s) (acquired), unspecified foot: Secondary | ICD-10-CM | POA: Diagnosis not present

## 2023-01-27 DIAGNOSIS — L600 Ingrowing nail: Secondary | ICD-10-CM | POA: Diagnosis not present

## 2023-01-27 NOTE — Progress Notes (Signed)
Subjective:   Patient ID: Robin [name], female   DOB: 52 y.o.   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  States that they do get sore.  Patient does not smoke likes to be active   Review of Systems  All other systems reviewed and are negative.       Objective:  Physical Exam Vitals and nursing note reviewed.  Constitutional:      Appearance: She is well-developed.  Pulmonary:     Effort: Pulmonary effort is normal.  Musculoskeletal:        General: Normal range of motion.  Skin:    General: Skin is warm.  Neurological:     Mental Status: She is alert.     Neurovascular status intact muscle strength adequate range of motion adequate with functional and possible structural hallux limitus condition bilateral first MPJ with pain of the joint surface and incurvated medial border left hallux and some discoloration of the right hallux distal two thirds of the nailbed.  Good digital perfusion well-oriented x 3     Assessment:  Hallux limitus deformity bilateral with probability that there is bony changes along with ingrown toenail deformity left big toe and discoloration right big nail     Plan:  H&P reviewed all conditions.  I did go over her x-rays I do think shortening plantar flexor he osteotomies with removal of dorsal bone spurs will be in her best interest she wants to do this but is going away first week of July so we will plan on doing it after that.  Today we will get it fixed the ingrown toenail and I went ahead explained procedure risk and patient wants surgery and read consent form understanding risk and today I infiltrated the left big toe 60 mg like Marcaine mixture sterile prep  done using sterile instrumentation removed the medial border exposed matrix applied phenol 3 applications 30 seconds followed by alcohol lavage sterile dressing gave instructions on soaks and to wear dressing for 24 hours take it off earlier if throbbing were to occur and encouraged to call questions and will be reevaluated 2 weeks  X-rays indicate spur formation dorsal first metatarsal head left over right heel elongated first metatarsal segment bilateral with elevated first metatarsal segment bilateral

## 2023-02-01 DIAGNOSIS — F339 Major depressive disorder, recurrent, unspecified: Secondary | ICD-10-CM | POA: Diagnosis not present

## 2023-02-01 DIAGNOSIS — F411 Generalized anxiety disorder: Secondary | ICD-10-CM | POA: Diagnosis not present

## 2023-02-01 DIAGNOSIS — F902 Attention-deficit hyperactivity disorder, combined type: Secondary | ICD-10-CM | POA: Diagnosis not present

## 2023-02-02 ENCOUNTER — Other Ambulatory Visit: Payer: Self-pay | Admitting: Physician Assistant

## 2023-02-02 DIAGNOSIS — M3219 Other organ or system involvement in systemic lupus erythematosus: Secondary | ICD-10-CM

## 2023-02-02 NOTE — Telephone Encounter (Signed)
Last Fill: 11/30/2022  Eye exam: 10/05/2022   Labs: 11/30/2022 No proteinuria. CBC and CMP WNL. ESR WNL. Complements WNL. Vitamin D WNL. Continue on current maintenance dose of vitamin D. ANA remains positive.  dsDNA is negative.  Labs are not consistent with a flare.  No medication changes recommended at this time.   Next Visit: 05/04/2023  Last Visit: 11/30/2022   DX:Other systemic lupus erythematosus with other organ involvement   Current Dose per office note 11/30/2022: Plaquenil 200 mg 1 tablet by mouth twice daily   Okay to refill Plaquenil?

## 2023-02-09 ENCOUNTER — Encounter: Payer: Self-pay | Admitting: Podiatry

## 2023-02-09 ENCOUNTER — Ambulatory Visit: Payer: BC Managed Care – PPO | Admitting: Podiatry

## 2023-02-09 DIAGNOSIS — M205X9 Other deformities of toe(s) (acquired), unspecified foot: Secondary | ICD-10-CM

## 2023-02-09 DIAGNOSIS — M216X2 Other acquired deformities of left foot: Secondary | ICD-10-CM | POA: Diagnosis not present

## 2023-02-09 NOTE — Progress Notes (Signed)
Subjective:   Patient ID: Pearla Dubonnet, female   DOB: 52 y.o.   MRN: [id number]   HPI Patient presents stating the ingrown is healing well and I know I need to get these painful joints fixed on both feet but states she also needs shoulder surgery right with arthroscopic tendon repair   ROS      Objective:  Physical Exam  Neurovascular status intact inflammation pain around the first MPJ left over right with moderate range of motion loss no crepitus of the joint and dorsal bone spur formation.  Nail site left healing well with minimal drainage     Assessment:  Doing well with ingrown toenail surgery does have chronic structural deformity with elevated elongated segments bone spur formation first metatarsal head and pain     Plan:  H&P reviewed conditions at great length.  Due to longstanding nature of condition failure to respond to shoe gear modifications and soaks anti-inflammatories I recommended osteotomy to try to control the pathology.  I did explain someday in future may require fusion or joint implantation but I am confident about where it stands at this time and she wants surgery.  I allowed her to read consent form going over surgery and risk and all other complications that can occur.  She is willing to accept this wants the procedures at this point I had her sign consent form and she understands total recovery will take 6 months we will do 1 at a time and the left one scheduled first.  I did dispense air fracture walker today fitted properly to her lower leg and I like her to get used to it before surgery and find a shoe on the other foot that fits well with this deformity

## 2023-02-10 DIAGNOSIS — R55 Syncope and collapse: Secondary | ICD-10-CM | POA: Diagnosis not present

## 2023-02-10 DIAGNOSIS — G901 Familial dysautonomia [Riley-Day]: Secondary | ICD-10-CM | POA: Diagnosis not present

## 2023-02-10 DIAGNOSIS — R000 Tachycardia, unspecified: Secondary | ICD-10-CM | POA: Diagnosis not present

## 2023-02-14 DIAGNOSIS — R109 Unspecified abdominal pain: Secondary | ICD-10-CM | POA: Diagnosis not present

## 2023-02-14 DIAGNOSIS — R1030 Lower abdominal pain, unspecified: Secondary | ICD-10-CM | POA: Diagnosis not present

## 2023-02-14 DIAGNOSIS — Z1231 Encounter for screening mammogram for malignant neoplasm of breast: Secondary | ICD-10-CM | POA: Diagnosis not present

## 2023-02-14 DIAGNOSIS — R3914 Feeling of incomplete bladder emptying: Secondary | ICD-10-CM | POA: Diagnosis not present

## 2023-02-14 DIAGNOSIS — Z01419 Encounter for gynecological examination (general) (routine) without abnormal findings: Secondary | ICD-10-CM | POA: Diagnosis not present

## 2023-02-14 DIAGNOSIS — D259 Leiomyoma of uterus, unspecified: Secondary | ICD-10-CM | POA: Diagnosis not present

## 2023-02-14 DIAGNOSIS — R140 Abdominal distension (gaseous): Secondary | ICD-10-CM | POA: Diagnosis not present

## 2023-02-15 DIAGNOSIS — F331 Major depressive disorder, recurrent, moderate: Secondary | ICD-10-CM | POA: Diagnosis not present

## 2023-02-23 DIAGNOSIS — G901 Familial dysautonomia [Riley-Day]: Secondary | ICD-10-CM | POA: Diagnosis not present

## 2023-04-04 ENCOUNTER — Telehealth: Payer: Self-pay | Admitting: Podiatry

## 2023-04-04 DIAGNOSIS — F331 Major depressive disorder, recurrent, moderate: Secondary | ICD-10-CM | POA: Diagnosis not present

## 2023-04-04 NOTE — Telephone Encounter (Signed)
DOS 04/18/2023  [facility]- [id number]  [employer] EFFECTIVE DATE - 09/12/2022  DED $1000 W/ $0 REMAINING OOP $3000 W/ $0 REMAINING COINS 20%  PER [facility] INCOMING FAX, CPT CODE 96045 IS APPROVED FOR SERVICE FROM 04/18/2023 - 06/16/2023  AUTHORIZATION NUMBER [id number]

## 2023-04-11 ENCOUNTER — Telehealth: Payer: Self-pay | Admitting: Podiatry

## 2023-04-11 NOTE — Telephone Encounter (Signed)
PT CANCELED SURGERY, NO REASON GIVEN. ADVISED DR REGAL AND [facility].

## 2023-04-24 ENCOUNTER — Encounter: Payer: BC Managed Care – PPO | Admitting: Podiatry

## 2023-04-24 NOTE — Progress Notes (Deleted)
Office Visit Note  Patient: Robin [name]             Date of Birth: [birth year]           MRN: [id number]             PCP: Caesar Bookman, NP Referring: Caesar Bookman, NP Visit Date: 05/04/2023 Occupation: @GUAROCC @  Subjective:  No chief complaint on file.   History of Present Illness: Robin [name] is a 52 y.o. female ***     Activities of Daily Living:  Patient reports morning stiffness for *** {minute/hour:19697}.   Patient {ACTIONS;DENIES/REPORTS:21021675::"Denies"} nocturnal pain.  Difficulty dressing/grooming: {ACTIONS;DENIES/REPORTS:21021675::"Denies"} Difficulty climbing stairs: {ACTIONS;DENIES/REPORTS:21021675::"Denies"} Difficulty getting out of chair: {ACTIONS;DENIES/REPORTS:21021675::"Denies"} Difficulty using hands for taps, buttons, cutlery, and/or writing: {ACTIONS;DENIES/REPORTS:21021675::"Denies"}  No Rheumatology ROS completed.   PMFS History:  Patient Active Problem List   Diagnosis Date Noted   Partial nontraumatic tear of right rotator cuff 12/22/2022   Left hamstring injury 07/15/2022   Labral tear of shoulder, degenerative, right 06/21/2022   Left carpal tunnel syndrome 05/26/2022   Foot sprain, left, initial encounter 05/19/2022   Baker's cyst of knee, left 04/21/2022   Right carpal tunnel syndrome 03/16/2022   Flushing 04/14/2021   Hypermobility syndrome 11/27/2020   Seasonal and perennial allergic rhinitis 11/06/2018   Mild intermittent asthma without complication 11/06/2018   Flexural atopic dermatitis 11/06/2018   History of hyperlipidemia 11/08/2016   History of anxiety 11/08/2016   History of retinal tear 11/08/2016   History of Bilateral plantar fasciitis 08/10/2016   High risk medication use 07/04/2016   Lupus (HCC) 05/23/2016   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HCC) 03/18/2016   ADHD (attention deficit hyperactivity disorder) 12/22/2014   Raynaud's phenomenon 10/17/2013   Hyperlipidemia 09/28/2011    Past Medical History:   Diagnosis Date   ADD (attention deficit disorder)    Allergy    Anemia    Anorexia nervosa with bulimia    Per [facility] New Patient Packet   Anxiety    Asthma    Carpal tunnel syndrome    Per [facility] New Patient Packet   Depression    Dry eyes    Per [facility] New Patient Packet   Dry mouth    Per [facility] New Patient Packet   Fibroids    Per [facility] New Patient Packet   History of hip x-ray 2021   Per [facility] New Patient Packet   History of Papanicolaou smear of cervix 2021   Per [facility] New Patient Packet, Dr.Pickens   Hyperlipidemia    Ileitis    Lupus (HCC)    PONV (postoperative nausea and vomiting)    mild nausea    Raynaud disease    Retinal tear    Per [facility] New Patient Packet   Systemic lupus erythematosus (HCC)     Family History  Problem Relation Age of Onset   Heart disease Mother 60       CAD/stenting   Hyperlipidemia Mother    Arthritis Mother        ostearthritis   Hypertension Mother    Kidney disease Mother    Irritable bowel syndrome Mother    Stroke Mother 36       CVA   Fibromyalgia Mother    Eczema Mother    Sjogren's syndrome Mother    Hyperlipidemia Father    Neuropathy Father    Allergies Father        shellfish   Prostatitis Father    Hyperlipidemia Sister  ADD / ADHD Sister    Bipolar disorder Sister    Depression Sister    Hypertension Brother    OCD Brother    Anxiety disorder Daughter    ADD / ADHD Daughter    Asthma Daughter    OCD Son    Colon cancer Neg Hx    Inflammatory bowel disease Neg Hx    Allergic rhinitis Neg Hx    Angioedema Neg Hx    Atopy Neg Hx    Immunodeficiency Neg Hx    Urticaria Neg Hx    Past Surgical History:  Procedure Laterality Date   CARPAL TUNNEL RELEASE Right    08/2019   CESAREAN SECTION  2003   CESAREAN SECTION  2005   Per [facility] New Patient Packet   COLONOSCOPY  2017   Dr.Jacobs   cyst drained Left 2023   knee   DENTAL SURGERY  2009   Fromed tooth and chronic infection   DILITATION & CURRETTAGE/HYSTROSCOPY  WITH NOVASURE ABLATION N/A 06/03/2020   Procedure: DILATATION & CURETTAGE/HYSTEROSCOPY WITH NOVASURE ABLATION;  Surgeon: Glynn Bing, MD;  Location: [facility];  Service: Gynecology;  Laterality: N/A;   ENDOMETRIAL BIOPSY  04/20/2020       EYE SURGERY Bilateral    cryoretinopexy   Social History   Social History Narrative   Marital status: married x 17 years; happily married      Children: 2 children (15, 52)      Lives: with husband, 2 children.      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 4 (including me)       Pets: 1 dog, 2 cats       Highest level or education completed: Undergraduate degree       Current/Past profession: Gaffer       Exercise:        Yes          Type and how often: 30-60 min walking 5 days a week. Pilate's 1 x a week, and horseback riding 2 times a week          Living Will: No   DNR: no, would like to discuss one    POA/HPOA: No      Functional Status:   Do you have difficulty bathing or dressing yourself? Left blank    Do you have difficulty preparing food or eating? Left blank   Do you have difficulty managing your medications? Left blank   Do you have difficulty managing your finances? Left blank   Do you have difficulty affording your medications? Left blank   Immunization History  Administered Date(s) Administered   Influenza,inj,Quad PF,6+ Mos 05/12/2014, 11/27/2015, 05/23/2016, 10/30/2017, 07/30/2018   Influenza-Unspecified  07/08/2022   Moderna Sars-Covid-2 Vaccination 10/27/2020, 10/31/2020  PFIZER(Purple Top)SARS-COV-2 Vaccination 12/06/2019, 12/16/2019, 05/27/2020, 07/07/2022   Pneumococcal Conjugate-13 05/23/2016   Tdap 09/28/2011, 08/12/2020     Objective: Vital Signs: There were no vitals taken for this visit.   Physical Exam   Musculoskeletal Exam: ***  CDAI Exam: CDAI Score: -- Patient Global: --; Provider Global: -- Swollen: --; Tender: -- Joint Exam 05/04/2023   No joint exam has been documented for this visit   There is currently no information documented on the homunculus. Go to the Rheumatology activity and complete the homunculus joint exam.  Investigation: No additional findings.  Imaging: No results found.  Recent Labs: Lab Results  Component Value Date   WBC 4.6 11/30/2022   HGB 13.5 11/30/2022   PLT 273 11/30/2022   NA 141 11/30/2022   K 4.4 11/30/2022   CL 104 11/30/2022   CO2 29 11/30/2022   GLUCOSE 94 11/30/2022   BUN 15 11/30/2022   CREATININE 0.86 11/30/2022   BILITOT 0.4 11/30/2022   ALKPHOS 26 (L) 06/01/2020   AST 26 11/30/2022   ALT 22 11/30/2022   PROT 7.3 11/30/2022   ALBUMIN 3.8 06/01/2020   CALCIUM 9.9 11/30/2022   GFRAA 93 01/26/2021    Speciality Comments: PLQ Eye Exam: 10/05/2022 @ [facility] (in [place]) Follow up in 1 year.   ANA 1:320 speckled, history of oral ulcers, lymphadenopathy, photosensitivity, Raynauds, erythema nodosum, inflammatory arthritis.  Procedures:  No procedures performed Allergies: Cromolyn sodium, Avelox [moxifloxacin hcl in nacl], Cefaclor, Dust mite extract, Latex, Mold extract [trichophyton], Moxifloxacin, Red wine complex [germanium], Meloxicam, and Naproxen   Assessment / Plan:     Visit Diagnoses: Other systemic lupus erythematosus with other organ involvement (HCC)  High risk medication use  Erythema nodosum  Chronic right shoulder pain  Raynaud's phenomenon without  gangrene  Primary osteoarthritis of both hands  Primary osteoarthritis of both feet  Hypermobility of joint  Other fatigue  S/P carpal tunnel release  Chronic left SI joint pain  Terminal ileitis without complication (HCC)  Vitamin D deficiency  Attention deficit hyperactivity disorder (ADHD), predominantly inattentive type  History of hyperlipidemia  History of anxiety  History of retinal tear  Orders: No orders of the defined types were placed in this encounter.  No orders of the defined types were placed in this encounter.   Face-to-face time spent with patient was *** minutes. Greater than 50% of time was spent in counseling and coordination of care.  Follow-Up Instructions: No follow-ups on file.   Gearldine Bienenstock, PA-C  Note - This record has been created using Dragon software.  Chart creation errors have been sought, but may not always  have been located. Such creation errors do not reflect on  the standard of medical care.

## 2023-05-02 DIAGNOSIS — F902 Attention-deficit hyperactivity disorder, combined type: Secondary | ICD-10-CM | POA: Diagnosis not present

## 2023-05-02 DIAGNOSIS — F339 Major depressive disorder, recurrent, unspecified: Secondary | ICD-10-CM | POA: Diagnosis not present

## 2023-05-02 DIAGNOSIS — F411 Generalized anxiety disorder: Secondary | ICD-10-CM | POA: Diagnosis not present

## 2023-05-04 ENCOUNTER — Ambulatory Visit: Payer: BC Managed Care – PPO | Admitting: Physician Assistant

## 2023-05-04 DIAGNOSIS — M19071 Primary osteoarthritis, right ankle and foot: Secondary | ICD-10-CM

## 2023-05-04 DIAGNOSIS — R5383 Other fatigue: Secondary | ICD-10-CM

## 2023-05-04 DIAGNOSIS — I7300 Raynaud's syndrome without gangrene: Secondary | ICD-10-CM

## 2023-05-04 DIAGNOSIS — K5000 Crohn's disease of small intestine without complications: Secondary | ICD-10-CM

## 2023-05-04 DIAGNOSIS — Z79899 Other long term (current) drug therapy: Secondary | ICD-10-CM

## 2023-05-04 DIAGNOSIS — Z8659 Personal history of other mental and behavioral disorders: Secondary | ICD-10-CM

## 2023-05-04 DIAGNOSIS — Z8639 Personal history of other endocrine, nutritional and metabolic disease: Secondary | ICD-10-CM

## 2023-05-04 DIAGNOSIS — L52 Erythema nodosum: Secondary | ICD-10-CM

## 2023-05-04 DIAGNOSIS — Z8669 Personal history of other diseases of the nervous system and sense organs: Secondary | ICD-10-CM

## 2023-05-04 DIAGNOSIS — M249 Joint derangement, unspecified: Secondary | ICD-10-CM

## 2023-05-04 DIAGNOSIS — E559 Vitamin D deficiency, unspecified: Secondary | ICD-10-CM

## 2023-05-04 DIAGNOSIS — Z9889 Other specified postprocedural states: Secondary | ICD-10-CM

## 2023-05-04 DIAGNOSIS — G8929 Other chronic pain: Secondary | ICD-10-CM

## 2023-05-04 DIAGNOSIS — M19042 Primary osteoarthritis, left hand: Secondary | ICD-10-CM

## 2023-05-04 DIAGNOSIS — F900 Attention-deficit hyperactivity disorder, predominantly inattentive type: Secondary | ICD-10-CM

## 2023-05-04 DIAGNOSIS — M3219 Other organ or system involvement in systemic lupus erythematosus: Secondary | ICD-10-CM

## 2023-05-08 ENCOUNTER — Encounter: Payer: BC Managed Care – PPO | Admitting: Podiatry

## 2023-05-09 ENCOUNTER — Other Ambulatory Visit: Payer: Self-pay | Admitting: Physician Assistant

## 2023-05-09 ENCOUNTER — Encounter: Payer: Self-pay | Admitting: Orthopaedic Surgery

## 2023-05-09 ENCOUNTER — Ambulatory Visit: Payer: BC Managed Care – PPO | Admitting: Orthopaedic Surgery

## 2023-05-09 DIAGNOSIS — M3219 Other organ or system involvement in systemic lupus erythematosus: Secondary | ICD-10-CM

## 2023-05-09 DIAGNOSIS — M75111 Incomplete rotator cuff tear or rupture of right shoulder, not specified as traumatic: Secondary | ICD-10-CM

## 2023-05-09 DIAGNOSIS — G5602 Carpal tunnel syndrome, left upper limb: Secondary | ICD-10-CM | POA: Diagnosis not present

## 2023-05-09 MED ORDER — LIDOCAINE HCL 1 % IJ SOLN
1.0000 mL | INTRAMUSCULAR | Status: AC | PRN
Start: 2023-05-09 — End: 2023-05-09
  Administered 2023-05-09: 1 mL

## 2023-05-09 MED ORDER — BUPIVACAINE HCL 0.5 % IJ SOLN
1.0000 mL | INTRAMUSCULAR | Status: AC | PRN
Start: 2023-05-09 — End: 2023-05-09
  Administered 2023-05-09: 1 mL

## 2023-05-09 MED ORDER — METHYLPREDNISOLONE ACETATE 40 MG/ML IJ SUSP
40.0000 mg | INTRAMUSCULAR | Status: AC | PRN
Start: 2023-05-09 — End: 2023-05-09
  Administered 2023-05-09: 40 mg

## 2023-05-09 NOTE — Progress Notes (Signed)
Office Visit Note   Patient: Robin [name]           Date of Birth: 06/02/1971           MRN: [id number] Visit Date: 05/09/2023              Requested by: Caesar Bookman, NP [address] [place],  [place] [address] PCP: Caesar Bookman, NP   Assessment & Plan: Visit Diagnoses:  1. Partial nontraumatic tear of right rotator cuff   2. Left carpal tunnel syndrome     Plan: Cyanne is a 52 year old female with chronic right shoulder pain due to rotator cuff tendinopathy and partial-thickness tears and left moderate to severe carpal tunnel syndrome.  She continues to have pain in her shoulder despite extensive conservative treatment with physical therapy, home exercise program, activity modification.  She would like to look at doing surgery for September or October.  Eunice Blase will call the patient to confirm surgery time.  In regards to the left carpal tunnel syndrome she would like to repeat the cortisone injection today.  Possibly look at surgery for the carpal tunnel in November or December.  Follow-Up Instructions: No follow-ups on file.   Orders:  Orders Placed This Encounter  Procedures   Hand/UE Inj   No orders of the defined types were placed in this encounter.     Procedures: Hand/UE Inj: L carpal tunnel for carpal tunnel syndrome on 05/09/2023 9:02 AM Details: 25 G needle Medications: 1 mL lidocaine 1 %; 1 mL bupivacaine 0.5 %; 40 mg methylPREDNISolone acetate 40 MG/ML Outcome: tolerated well, no immediate complications Patient was prepped and draped in the usual sterile fashion.       Clinical Data: No additional findings.   Subjective: Chief Complaint  Patient presents with   Left Hand - Pain    HPI Jessey comes back today for follow-up on right shoulder pain and    Objective: Vital Signs: There were no vitals taken for this visit.  Physical Exam Vitals and nursing note reviewed.  Constitutional:      Appearance: She is well-developed.  HENT:      Head: Normocephalic and atraumatic.  Pulmonary:     Effort: Pulmonary effort is normal.  Abdominal:     Palpations: Abdomen is soft.  Musculoskeletal:     Cervical back: Neck supple.  Skin:    General: Skin is warm.     Capillary Refill: Capillary refill takes less than 2 seconds.  Neurological:     Mental Status: She is alert and oriented to person, place, and time.  Psychiatric:        Behavior: Behavior normal.        Thought Content: Thought content normal.        Judgment: Judgment normal.     Ortho Exam Examination of the left hand and right shoulder are unchanged. Specialty Comments:  No specialty comments available.  Imaging: No results found.   PMFS History: Patient Active Problem List   Diagnosis Date Noted   Partial nontraumatic tear of right rotator cuff 12/22/2022   Left hamstring injury 07/15/2022   Labral tear of shoulder, degenerative, right 06/21/2022   Left carpal tunnel syndrome 05/26/2022   Foot sprain, left, initial encounter 05/19/2022   Baker's cyst of knee, left 04/21/2022   Right carpal tunnel syndrome 03/16/2022   Flushing 04/14/2021   Hypermobility syndrome 11/27/2020   Seasonal and perennial allergic rhinitis 11/06/2018   Mild intermittent asthma without complication 11/06/2018  Flexural atopic dermatitis 11/06/2018   History of hyperlipidemia 11/08/2016   History of anxiety 11/08/2016   History of retinal tear 11/08/2016   History of Bilateral plantar fasciitis 08/10/2016   High risk medication use 07/04/2016   Lupus (HCC) 05/23/2016   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HCC) 03/18/2016   ADHD (attention deficit hyperactivity disorder) 12/22/2014   Raynaud's phenomenon 10/17/2013   Hyperlipidemia 09/28/2011   Past Medical History:  Diagnosis Date   ADD (attention deficit disorder)    Allergy    Anemia    Anorexia nervosa with bulimia    Per [facility] New Patient Packet   Anxiety    Asthma    Carpal tunnel syndrome    Per [facility] New Patient  Packet   Depression    Dry eyes    Per [facility] New Patient Packet   Dry mouth    Per [facility] New Patient Packet   Fibroids    Per [facility] New Patient Packet   History of hip x-ray 2021   Per [facility] New Patient Packet   History of Papanicolaou smear of cervix 2021   Per [facility] New Patient Packet, Dr.Pickens   Hyperlipidemia    Ileitis    Lupus (HCC)    PONV (postoperative nausea and vomiting)    mild nausea    Raynaud disease    Retinal tear    Per [facility] New Patient Packet   Systemic lupus erythematosus (HCC)     Family History  Problem Relation Age of Onset   Heart disease Mother 39       CAD/stenting   Hyperlipidemia Mother    Arthritis Mother        ostearthritis   Hypertension Mother    Kidney disease Mother    Irritable bowel syndrome Mother    Stroke Mother 89       CVA   Fibromyalgia Mother    Eczema Mother    Sjogren's syndrome Mother    Hyperlipidemia Father    Neuropathy Father    Allergies Father        shellfish   Prostatitis Father    Hyperlipidemia Sister    ADD / ADHD Sister    Bipolar disorder Sister    Depression Sister    Hypertension Brother    OCD Brother    Anxiety disorder Daughter    ADD / ADHD Daughter    Asthma Daughter    OCD Son    Colon cancer Neg Hx    Inflammatory bowel disease Neg Hx    Allergic rhinitis Neg Hx    Angioedema Neg Hx    Atopy Neg Hx    Immunodeficiency Neg Hx    Urticaria Neg Hx     Past Surgical History:  Procedure Laterality Date   CARPAL TUNNEL RELEASE Right    08/2019   CESAREAN SECTION  2003   CESAREAN SECTION  2005   Per [facility] New Patient Packet   COLONOSCOPY  2017   Dr.Jacobs   cyst drained Left 2023   knee   DENTAL SURGERY  2009   Fromed tooth and chronic infection   DILITATION & CURRETTAGE/HYSTROSCOPY WITH NOVASURE ABLATION N/A 06/03/2020   Procedure: DILATATION & CURETTAGE/HYSTEROSCOPY WITH NOVASURE ABLATION;  Surgeon: [place] Bing, MD;  Location: [facility];  Service: Gynecology;   Laterality: N/A;   ENDOMETRIAL BIOPSY  04/20/2020       EYE SURGERY Bilateral    cryoretinopexy   Social History   Occupational History   Not on file  Tobacco Use   Smoking status: Former    Current packs/day: 0.00    Average packs/day: 0.8 packs/day for 10.0 years (7.5 ttl pk-yrs)    Types: Cigarettes    Start date: 07/06/1991    Quit date: 07/05/2001    Years since quitting: 21.8    Passive exposure: Never   Smokeless tobacco: Never  Vaping Use   Vaping status: Never Used  Substance and Sexual Activity   Alcohol use: Yes    Comment: occ   Drug use: Never   Sexual activity: Yes    Birth control/protection: Condom

## 2023-05-09 NOTE — Telephone Encounter (Signed)
LVM for pt to schedule follow up appt. 

## 2023-05-09 NOTE — Telephone Encounter (Signed)
Please call patient to schedule appt. Return in about 5 months (around 05/02/2023) for Systemic lupus erythematosus, Osteoarthritis.   Thank you.

## 2023-05-09 NOTE — Telephone Encounter (Signed)
Last Fill: 02/02/2023  Eye exam: 10/05/2022   Labs: 11/30/2022 No proteinuria. CBC and CMP WNL. ESR WNL. Complements WNL. Vitamin D WNL. Continue on current maintenance dose of vitamin D. ANA remains positive.  dsDNA is negative.  Labs are not consistent with a flare.  No medication changes recommended at this time.   Next Visit: Message sent to front desk to schedule appt. Return in about 5 months (around 05/02/2023) for Systemic lupus erythematosus, Osteoarthritis.   Last Visit: 11/30/2022  DX:Other systemic lupus erythematosus with other organ involvement   Current Dose per office note 11/30/2022: Plaquenil 200 mg 1 tablet by mouth twice daily.   Okay to refill Plaquenil?

## 2023-05-10 ENCOUNTER — Other Ambulatory Visit: Payer: Self-pay | Admitting: Allergy & Immunology

## 2023-05-17 ENCOUNTER — Encounter: Payer: Self-pay | Admitting: Orthopaedic Surgery

## 2023-05-17 ENCOUNTER — Encounter: Payer: Self-pay | Admitting: Podiatry

## 2023-05-17 DIAGNOSIS — F331 Major depressive disorder, recurrent, moderate: Secondary | ICD-10-CM | POA: Diagnosis not present

## 2023-05-25 ENCOUNTER — Telehealth: Payer: Self-pay

## 2023-05-25 NOTE — Telephone Encounter (Signed)
Pts 9/26 surgery was denied by [facility]. P2P is an option. Please advise if you would like me to set this up.  [phone] SURGICAL ARTHROSCOPY SHOULDER XTNSV DBRDMT 3+ Right  Non-Authorized Criteria Not Met  Clinical Rationale: Your doctor told us you have shoulder pain. Your doctor wants to do surgery to remove some tissue in the shoulder joint to treat your pain. This is called debridement. This surgery is needed when certain criteria are met. Recent pictures of your shoulder (CT or MRI) should show certain findings. We need to receive detailed results of these pictures. We reviewed the notes we received. The notes do not show that your pictures show these findings. As a result, this surgery is not medically necessary. We used [employer] Medical Benefits Management Clinical Guideline titled Joint Surgery, Shoulder Arthroscopy and Open Procedures to make this decision. You may view this guideline at [email or web address]/mbm-[email or web address].

## 2023-05-25 NOTE — Telephone Encounter (Signed)
Please set up p2p. Thanks.

## 2023-05-26 NOTE — Progress Notes (Unsigned)
Office Visit Note  Patient: Robin [name]             Date of Birth: July 10, 1971           MRN: [id number]             PCP: Caesar Bookman, NP Referring: Caesar Bookman, NP Visit Date: 05/30/2023 Occupation: @GUAROCC @  Subjective:  No chief complaint on file.   History of Present Illness: Robin [name] is a 52 y.o. female ***     Activities of Daily Living:  Patient reports morning stiffness for *** {minute/hour:19697}.   Patient {ACTIONS;DENIES/REPORTS:21021675::"Denies"} nocturnal pain.  Difficulty dressing/grooming: {ACTIONS;DENIES/REPORTS:21021675::"Denies"} Difficulty climbing stairs: {ACTIONS;DENIES/REPORTS:21021675::"Denies"} Difficulty getting out of chair: {ACTIONS;DENIES/REPORTS:21021675::"Denies"} Difficulty using hands for taps, buttons, cutlery, and/or writing: {ACTIONS;DENIES/REPORTS:21021675::"Denies"}  No Rheumatology ROS completed.   PMFS History:  Patient Active Problem List   Diagnosis Date Noted  . Partial nontraumatic tear of right rotator cuff 12/22/2022  . Left hamstring injury 07/15/2022  . Labral tear of shoulder, degenerative, right 06/21/2022  . Left carpal tunnel syndrome 05/26/2022  . Foot sprain, left, initial encounter 05/19/2022  . Baker's cyst of knee, left 04/21/2022  . Right carpal tunnel syndrome 03/16/2022  . Flushing 04/14/2021  . Hypermobility syndrome 11/27/2020  . Seasonal and perennial allergic rhinitis 11/06/2018  . Mild intermittent asthma without complication 11/06/2018  . Flexural atopic dermatitis 11/06/2018  . History of hyperlipidemia 11/08/2016  . History of anxiety 11/08/2016  . History of retinal tear 11/08/2016  . History of Bilateral plantar fasciitis 08/10/2016  . High risk medication use 07/04/2016  . Lupus (HCC) 05/23/2016  . 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HCC) 03/18/2016  . ADHD (attention deficit hyperactivity disorder) 12/22/2014  . Raynaud's phenomenon 10/17/2013  . Hyperlipidemia 09/28/2011    Past  Medical History:  Diagnosis Date  . ADD (attention deficit disorder)   . Allergy   . Anemia   . Anorexia nervosa with bulimia    Per [facility] New Patient Packet  . Anxiety   . Asthma   . Carpal tunnel syndrome    Per [facility] New Patient Packet  . Depression   . Dry eyes    Per [facility] New Patient Packet  . Dry mouth    Per [facility] New Patient Packet  . Fibroids    Per [facility] New Patient Packet  . History of hip x-ray 2021   Per [facility] New Patient Packet  . History of Papanicolaou smear of cervix 2021   Per [facility] New Patient Packet, Dr.Pickens  . Hyperlipidemia   . Ileitis   . Lupus (HCC)   . PONV (postoperative nausea and vomiting)    mild nausea   . Raynaud disease   . Retinal tear    Per [facility] New Patient Packet  . Systemic lupus erythematosus (HCC)     Family History  Problem Relation Age of Onset  . Heart disease Mother 64       CAD/stenting  . Hyperlipidemia Mother   . Arthritis Mother        ostearthritis  . Hypertension Mother   . Kidney disease Mother   . Irritable bowel syndrome Mother   . Stroke Mother 55       CVA  . Fibromyalgia Mother   . Eczema Mother   . Sjogren's syndrome Mother   . Hyperlipidemia Father   . Neuropathy Father   . Allergies Father        shellfish  . Prostatitis Father   . Hyperlipidemia Sister   .  ADD / ADHD Sister   . Bipolar disorder Sister   . Depression Sister   . Hypertension Brother   . OCD Brother   . Anxiety disorder Daughter   . ADD / ADHD Daughter   . Asthma Daughter   . OCD Son   . Colon cancer Neg Hx   . Inflammatory bowel disease Neg Hx   . Allergic rhinitis Neg Hx   . Angioedema Neg Hx   . Atopy Neg Hx   . Immunodeficiency Neg Hx   . Urticaria Neg Hx    Past Surgical History:  Procedure Laterality Date  . CARPAL TUNNEL RELEASE Right    08/2019  . CESAREAN SECTION  2003  . CESAREAN SECTION  2005   Per [facility] New Patient Packet  . COLONOSCOPY  2017   Dr.Jacobs  . cyst drained Left 2023   knee  . DENTAL SURGERY  2009    Fromed tooth and chronic infection  . DILITATION & CURRETTAGE/HYSTROSCOPY WITH NOVASURE ABLATION N/A 06/03/2020   Procedure: DILATATION & CURETTAGE/HYSTEROSCOPY WITH NOVASURE ABLATION;  Surgeon: Bowler Bing, MD;  Location: [facility];  Service: Gynecology;  Laterality: N/A;  . ENDOMETRIAL BIOPSY  04/20/2020      . EYE SURGERY Bilateral    cryoretinopexy   Social History   Social History Narrative   Marital status: married x 17 years; happily married      Children: 2 children (15, 66)      Lives: with husband, 2 children.      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 4 (including me)       Pets: 1 dog, 2 cats       Highest level or education completed: Undergraduate degree       Current/Past profession: Gaffer       Exercise:        Yes          Type and how often: 30-60 min walking 5 days a week. Pilate's 1 x a week, and horseback riding 2 times a week          Living Will: No   DNR: no, would like to discuss one    POA/HPOA: No      Functional Status:   Do you have difficulty bathing or dressing yourself? Left blank    Do you have difficulty preparing food or eating? Left blank   Do you have difficulty managing your medications? Left blank   Do you have difficulty managing your finances? Left blank   Do you have difficulty affording your medications? Left blank   Immunization History  Administered Date(s) Administered  . Influenza,inj,Quad PF,6+ Mos 05/12/2014,  11/27/2015, 05/23/2016, 10/30/2017, 07/30/2018  . Influenza-Unspecified 07/08/2022  . Moderna Sars-Covid-2 Vaccination 10/27/2020, 10/31/2020  .  PFIZER(Purple Top)SARS-COV-2 Vaccination 12/06/2019, 12/16/2019, 05/27/2020, 07/07/2022  . Pneumococcal Conjugate-13 05/23/2016  . Tdap 09/28/2011, 08/12/2020     Objective: Vital Signs: There were no vitals taken for this visit.   Physical Exam   Musculoskeletal Exam: ***  CDAI Exam: CDAI Score: -- Patient Global: --; Provider Global: -- Swollen: --; Tender: -- Joint Exam 05/30/2023   No joint exam has been documented for this visit   There is currently no information documented on the homunculus. Go to the Rheumatology activity and complete the homunculus joint exam.  Investigation: No additional findings.  Imaging: No results found.  Recent Labs: Lab Results  Component Value Date   WBC 4.6 11/30/2022   HGB 13.5 11/30/2022   PLT 273 11/30/2022   NA 141 11/30/2022   K 4.4 11/30/2022   CL 104 11/30/2022   CO2 29 11/30/2022   GLUCOSE 94 11/30/2022   BUN 15 11/30/2022   CREATININE 0.86 11/30/2022   BILITOT 0.4 11/30/2022   ALKPHOS 26 (L) 06/01/2020   AST 26 11/30/2022   ALT 22 11/30/2022   PROT 7.3 11/30/2022   ALBUMIN 3.8 06/01/2020   CALCIUM 9.9 11/30/2022   GFRAA 93 01/26/2021    Speciality Comments: PLQ Eye Exam: 10/05/2022 @ [facility] (in [place]) Follow up in 1 year.   ANA 1:320 speckled, history of oral ulcers, lymphadenopathy, photosensitivity, Raynauds, erythema nodosum, inflammatory arthritis.  Procedures:  No procedures performed Allergies: Cromolyn sodium, Avelox [moxifloxacin hcl in nacl], Cefaclor, Dust mite extract, Latex, Mold extract [trichophyton], Moxifloxacin, Red wine complex [germanium], Meloxicam, and Naproxen   Assessment / Plan:     Visit Diagnoses: Other systemic lupus erythematosus with other organ involvement (HCC)  High risk medication use  Raynaud's  phenomenon without gangrene  Erythema nodosum  Chronic right shoulder pain  Primary osteoarthritis of both hands  Primary osteoarthritis of both feet  Hypermobility of joint  Other fatigue  S/P carpal tunnel release  Chronic left SI joint pain  Terminal ileitis without complication (HCC)  Vitamin D deficiency  Attention deficit hyperactivity disorder (ADHD), predominantly inattentive type  History of hyperlipidemia  History of anxiety  History of retinal tear  Orders: No orders of the defined types were placed in this encounter.  No orders of the defined types were placed in this encounter.   Face-to-face time spent with patient was *** minutes. Greater than 50% of time was spent in counseling and coordination of care.  Follow-Up Instructions: No follow-ups on file.   Gearldine Bienenstock, PA-C  Note - This record has been created using Dragon software.  Chart creation errors have been sought, but may not always  have been located. Such creation errors do not reflect on  the standard of medical care.

## 2023-05-30 ENCOUNTER — Ambulatory Visit: Payer: BC Managed Care – PPO | Attending: Physician Assistant | Admitting: Physician Assistant

## 2023-05-30 ENCOUNTER — Encounter: Payer: Self-pay | Admitting: Physician Assistant

## 2023-05-30 VITALS — BP 123/85 | HR 79 | Resp 14 | Ht 66.0 in | Wt 165.6 lb

## 2023-05-30 DIAGNOSIS — L52 Erythema nodosum: Secondary | ICD-10-CM

## 2023-05-30 DIAGNOSIS — F900 Attention-deficit hyperactivity disorder, predominantly inattentive type: Secondary | ICD-10-CM

## 2023-05-30 DIAGNOSIS — M533 Sacrococcygeal disorders, not elsewhere classified: Secondary | ICD-10-CM

## 2023-05-30 DIAGNOSIS — I7300 Raynaud's syndrome without gangrene: Secondary | ICD-10-CM

## 2023-05-30 DIAGNOSIS — R5383 Other fatigue: Secondary | ICD-10-CM

## 2023-05-30 DIAGNOSIS — E559 Vitamin D deficiency, unspecified: Secondary | ICD-10-CM

## 2023-05-30 DIAGNOSIS — M19072 Primary osteoarthritis, left ankle and foot: Secondary | ICD-10-CM

## 2023-05-30 DIAGNOSIS — M3219 Other organ or system involvement in systemic lupus erythematosus: Secondary | ICD-10-CM | POA: Diagnosis not present

## 2023-05-30 DIAGNOSIS — Z8639 Personal history of other endocrine, nutritional and metabolic disease: Secondary | ICD-10-CM

## 2023-05-30 DIAGNOSIS — Z79899 Other long term (current) drug therapy: Secondary | ICD-10-CM | POA: Diagnosis not present

## 2023-05-30 DIAGNOSIS — M25511 Pain in right shoulder: Secondary | ICD-10-CM

## 2023-05-30 DIAGNOSIS — K5000 Crohn's disease of small intestine without complications: Secondary | ICD-10-CM

## 2023-05-30 DIAGNOSIS — M19042 Primary osteoarthritis, left hand: Secondary | ICD-10-CM

## 2023-05-30 DIAGNOSIS — Z8669 Personal history of other diseases of the nervous system and sense organs: Secondary | ICD-10-CM

## 2023-05-30 DIAGNOSIS — M249 Joint derangement, unspecified: Secondary | ICD-10-CM

## 2023-05-30 DIAGNOSIS — G8929 Other chronic pain: Secondary | ICD-10-CM

## 2023-05-30 DIAGNOSIS — M19041 Primary osteoarthritis, right hand: Secondary | ICD-10-CM

## 2023-05-30 DIAGNOSIS — M19071 Primary osteoarthritis, right ankle and foot: Secondary | ICD-10-CM

## 2023-05-30 DIAGNOSIS — Z8659 Personal history of other mental and behavioral disorders: Secondary | ICD-10-CM

## 2023-05-30 DIAGNOSIS — Z9889 Other specified postprocedural states: Secondary | ICD-10-CM

## 2023-05-30 MED ORDER — HYDROXYCHLOROQUINE SULFATE 200 MG PO TABS
200.0000 mg | ORAL_TABLET | Freq: Two times a day (BID) | ORAL | 0 refills | Status: DC
Start: 2023-05-30 — End: 2023-11-01

## 2023-05-30 NOTE — Telephone Encounter (Signed)
Authorized 9/26-11/24.  #[id number]

## 2023-05-31 DIAGNOSIS — D259 Leiomyoma of uterus, unspecified: Secondary | ICD-10-CM | POA: Diagnosis not present

## 2023-05-31 DIAGNOSIS — R140 Abdominal distension (gaseous): Secondary | ICD-10-CM | POA: Diagnosis not present

## 2023-05-31 LAB — CBC WITH DIFFERENTIAL/PLATELET
Absolute Monocytes: 352 {cells}/uL (ref 200–950)
Basophils Absolute: 19 {cells}/uL (ref 0–200)
Basophils Relative: 0.5 %
Eosinophils Absolute: 130 {cells}/uL (ref 15–500)
Eosinophils Relative: 3.5 %
HCT: 40.4 % (ref 35.0–45.0)
Hemoglobin: 13.5 g/dL (ref 11.7–15.5)
Lymphs Abs: 1373 {cells}/uL (ref 850–3900)
MCH: 31.6 pg (ref 27.0–33.0)
MCHC: 33.4 g/dL (ref 32.0–36.0)
MCV: 94.6 fL (ref 80.0–100.0)
MPV: 9.9 fL (ref 7.5–12.5)
Monocytes Relative: 9.5 %
Neutro Abs: 1828 {cells}/uL (ref 1500–7800)
Neutrophils Relative %: 49.4 %
Platelets: 265 10*3/uL (ref 140–400)
RBC: 4.27 10*6/uL (ref 3.80–5.10)
RDW: 13 % (ref 11.0–15.0)
Total Lymphocyte: 37.1 %
WBC: 3.7 10*3/uL — ABNORMAL LOW (ref 3.8–10.8)

## 2023-05-31 LAB — SEDIMENTATION RATE: Sed Rate: 2 mm/h (ref 0–20)

## 2023-05-31 NOTE — Progress Notes (Signed)
WBC count is borderline low-3.7. rest of CBC WNL.  Alk phos is low-36. Rest of CMP WNL.  ESR WNL Protein creatinine ratio WNL.

## 2023-06-01 NOTE — Telephone Encounter (Signed)
See message.

## 2023-06-01 NOTE — Progress Notes (Signed)
dsDNA is negative.  Complements WNL.

## 2023-06-05 ENCOUNTER — Telehealth: Payer: Self-pay | Admitting: Orthopaedic Surgery

## 2023-06-05 NOTE — Telephone Encounter (Signed)
Left message on patient's voicemail the right shoulder scope with Dr Roda Shutters has  been authorized.  Patient has cancelled post op appointment for 06-15-23. Patient stated in the message she would like to push the surgery out.  I left a message informing the patient the surgery had been approved after the P2P with Dr. Roda Shutters was done.  I provided available dates for the patient and asked she return the call to let me know if she wanted to remain on the schedule or push the surgery out.   [facility] to say the patient has met her deductible and would not have to pay anything upfront.    Another message was left with the patient today.

## 2023-06-06 ENCOUNTER — Ambulatory Visit: Payer: BC Managed Care – PPO | Admitting: Orthopaedic Surgery

## 2023-06-06 DIAGNOSIS — M25552 Pain in left hip: Secondary | ICD-10-CM

## 2023-06-06 NOTE — Progress Notes (Signed)
Office Visit Note   Patient: Robin [name]           Date of Birth: Aug 11, 1971           MRN: [id number] Visit Date: 06/06/2023              Requested by: Caesar Bookman, NP [address] [place],  [place] [address] PCP: Caesar Bookman, NP   Assessment & Plan: Visit Diagnoses:  1. Pain in left hip     Plan: Arianny is a 52 year old female with probable exacerbation of left hip labral tear.  Recommend physical therapy and cortisone injection under ultrasound with Dr. Shon Baton.  Follow-Up Instructions: No follow-ups on file.   Orders:  Orders Placed This Encounter  Procedures   Ambulatory referral to Physical Therapy   No orders of the defined types were placed in this encounter.     Procedures: No procedures performed   Clinical Data: No additional findings.   Subjective: Chief Complaint  Patient presents with   Left Leg - Pain    HPI Robin [name] comes in today for left leg pain since Sunday after she went on a hike.  She has soreness while sitting and doing crafts.  She has anterior thigh and posterior buttock pain.  Complaining of pain when lifting her leg to get into the car.  Denies any numbness and tingling.  Pain is relieved by standing.  Currently alternating Tylenol and ibuprofen. Review of Systems  Constitutional: Negative.   HENT: Negative.    Eyes: Negative.   Respiratory: Negative.    Cardiovascular: Negative.   Endocrine: Negative.   Musculoskeletal: Negative.   Neurological: Negative.   Hematological: Negative.   Psychiatric/Behavioral: Negative.    All other systems reviewed and are negative.    Objective: Vital Signs: There were no vitals taken for this visit.  Physical Exam Vitals and nursing note reviewed.  Constitutional:      Appearance: She is well-developed.  HENT:     Head: Atraumatic.     Nose: Nose normal.  Eyes:     Extraocular Movements: Extraocular movements intact.  Cardiovascular:     Pulses: Normal pulses.   Pulmonary:     Effort: Pulmonary effort is normal.  Abdominal:     Palpations: Abdomen is soft.  Musculoskeletal:     Cervical back: Neck supple.  Skin:    General: Skin is warm.     Capillary Refill: Capillary refill takes less than 2 seconds.  Neurological:     Mental Status: She is alert. Mental status is at baseline.  Psychiatric:        Behavior: Behavior normal.        Thought Content: Thought content normal.        Judgment: Judgment normal.     Ortho Exam Exam of the left hip shows good range of motion.  Has pain with hip flexion and internal rotation.  No trochanteric tenderness.  Tenderness along the SI joint. Specialty Comments:  No specialty comments available.  Imaging: No results found.   PMFS History: Patient Active Problem List   Diagnosis Date Noted   Partial nontraumatic tear of right rotator cuff 12/22/2022   Left hamstring injury 07/15/2022   Labral tear of shoulder, degenerative, right 06/21/2022   Left carpal tunnel syndrome 05/26/2022   Foot sprain, left, initial encounter 05/19/2022   Baker's cyst of knee, left 04/21/2022   Right carpal tunnel syndrome 03/16/2022   Flushing 04/14/2021   Hypermobility syndrome 11/27/2020  Seasonal and perennial allergic rhinitis 11/06/2018   Mild intermittent asthma without complication 11/06/2018   Flexural atopic dermatitis 11/06/2018   History of hyperlipidemia 11/08/2016   History of anxiety 11/08/2016   History of retinal tear 11/08/2016   History of Bilateral plantar fasciitis 08/10/2016   High risk medication use 07/04/2016   Lupus (HCC) 05/23/2016   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HCC) 03/18/2016   ADHD (attention deficit hyperactivity disorder) 12/22/2014   Raynaud's phenomenon 10/17/2013   Hyperlipidemia 09/28/2011   Past Medical History:  Diagnosis Date   ADD (attention deficit disorder)    Allergy    Anemia    Anorexia nervosa with bulimia    Per [facility] New Patient Packet   Anxiety    Asthma    Carpal  tunnel syndrome    Per [facility] New Patient Packet   Depression    Dry eyes    Per [facility] New Patient Packet   Dry mouth    Per [facility] New Patient Packet   Fibroids    Per [facility] New Patient Packet   History of hip x-ray 2021   Per [facility] New Patient Packet   History of Papanicolaou smear of cervix 2021   Per [facility] New Patient Packet, Dr.Pickens   Hyperlipidemia    Ileitis    Lupus (HCC)    PONV (postoperative nausea and vomiting)    mild nausea    Raynaud disease    Retinal tear    Per [facility] New Patient Packet   Systemic lupus erythematosus (HCC)     Family History  Problem Relation Age of Onset   Heart disease Mother 75       CAD/stenting   Hyperlipidemia Mother    Arthritis Mother        ostearthritis   Hypertension Mother    Kidney disease Mother    Irritable bowel syndrome Mother    Stroke Mother 51       CVA   Fibromyalgia Mother    Eczema Mother    Sjogren's syndrome Mother    Hyperlipidemia Father    Neuropathy Father    Allergies Father        shellfish   Prostatitis Father    Hyperlipidemia Sister    ADD / ADHD Sister    Bipolar disorder Sister    Depression Sister    Hypertension Brother    OCD Brother    Anxiety disorder Daughter    ADD / ADHD Daughter    Asthma Daughter    OCD Son    Colon cancer Neg Hx    Inflammatory bowel disease Neg Hx    Allergic rhinitis Neg Hx    Angioedema Neg Hx    Atopy Neg Hx    Immunodeficiency Neg Hx    Urticaria Neg Hx     Past Surgical History:  Procedure Laterality Date   ABLATION  2022   CARPAL TUNNEL RELEASE Right    08/2019   CESAREAN SECTION  2003   CESAREAN SECTION  2005   Per [facility] New Patient Packet   COLONOSCOPY  2017   Dr.Jacobs   cyst drained Left 2023   knee   DENTAL SURGERY  2009   Fromed tooth and chronic infection   DILITATION & CURRETTAGE/HYSTROSCOPY WITH NOVASURE ABLATION N/A 06/03/2020   Procedure: DILATATION & CURETTAGE/HYSTEROSCOPY WITH NOVASURE ABLATION;  Surgeon: [place] Bing, MD;  Location:  [facility];  Service: Gynecology;  Laterality: N/A;   ENDOMETRIAL BIOPSY  04/20/2020  EYE SURGERY Bilateral    cryoretinopexy   hang nail procedure Left 2024   great toe   Social History   Occupational History   Not on file  Tobacco Use   Smoking status: Former    Current packs/day: 0.00    Average packs/day: 0.8 packs/day for 10.0 years (7.5 ttl pk-yrs)    Types: Cigarettes    Start date: 07/06/1991    Quit date: 07/05/2001    Years since quitting: 21.9    Passive exposure: Never   Smokeless tobacco: Never  Vaping Use   Vaping status: Never Used  Substance and Sexual Activity   Alcohol use: Yes    Comment: occ   Drug use: Never   Sexual activity: Yes    Birth control/protection: Condom

## 2023-06-07 ENCOUNTER — Telehealth: Payer: Self-pay | Admitting: Sports Medicine

## 2023-06-07 NOTE — Telephone Encounter (Signed)
Patient called and said that her hip is pain and she just wanted him to check it before the injection. CB#[phone]

## 2023-06-08 DIAGNOSIS — M545 Low back pain, unspecified: Secondary | ICD-10-CM | POA: Diagnosis not present

## 2023-06-08 DIAGNOSIS — M5417 Radiculopathy, lumbosacral region: Secondary | ICD-10-CM | POA: Diagnosis not present

## 2023-06-08 DIAGNOSIS — M25552 Pain in left hip: Secondary | ICD-10-CM | POA: Diagnosis not present

## 2023-06-12 ENCOUNTER — Encounter: Payer: Self-pay | Admitting: Sports Medicine

## 2023-06-13 ENCOUNTER — Encounter: Payer: Self-pay | Admitting: Orthopedic Surgery

## 2023-06-13 ENCOUNTER — Ambulatory Visit: Payer: BC Managed Care – PPO | Admitting: Orthopedic Surgery

## 2023-06-13 DIAGNOSIS — M2022 Hallux rigidus, left foot: Secondary | ICD-10-CM

## 2023-06-13 DIAGNOSIS — M2021 Hallux rigidus, right foot: Secondary | ICD-10-CM

## 2023-06-13 NOTE — Progress Notes (Unsigned)
Office Visit Note   Patient: Robin [name]           Date of Birth: 15-Feb-1971           MRN: [id number] Visit Date: 06/13/2023              Requested by: Caesar Bookman, NP [address] [place],  [place] [address] PCP: Caesar Bookman, NP  Chief Complaint  Patient presents with   Right Foot - Pain   Left Foot - Pain      HPI: Patient is a 52 year old woman with hallux rigidus bilaterally.  Patient has pain with hiking and activities of daily living.  Assessment & Plan: Visit Diagnoses:  1. Hallux rigidus of both feet     Plan: Recommended that she obtain 1.3 mm carbon fiber inserts to wear in her sneakers.  She can wear this until she is ready to proceed with surgery.  Patient states she would like to proceed with fusion of the left great toe MTP joint as an outpatient in December.  Risk and benefits were discussed postoperative course was discussed.  Patient states she understands wished to proceed at this time.  Follow-Up Instructions: No follow-ups on file.   Ortho Exam  Patient is alert, oriented, no adenopathy, well-dressed, normal affect, normal respiratory effort. Examination of both feet she has a good dorsalis pedis pulse.  She has hallux rigidus bilaterally with osteophytic bone spurs around the MTP joint bilaterally.  Patient has good dorsiflexion of the ankle bilaterally with dorsiflexion about 20 degrees past neutral.  She has hallux rigidus worse on the left than the right with dorsiflexion 20 degrees plantarflexion of 20 degrees the MTP joint on the left.  On the right she has dorsiflexion 40 degrees plantarflexion of 20 degrees of the MTP joint.  We discussed treatment options including total joint replacement cheilectomy, Moberg osteotomy, fusion.  Imaging: No results found. No images are attached to the encounter.  Labs: Lab Results  Component Value Date   ESRSEDRATE 2 05/30/2023   ESRSEDRATE 6 11/30/2022   ESRSEDRATE 6 05/26/2022   CRP 3.7 (H)  04/14/2016     Lab Results  Component Value Date   ALBUMIN 3.8 06/01/2020   ALBUMIN 4.6 02/20/2020   ALBUMIN 4.8 04/19/2017    No results found for: "MG" Lab Results  Component Value Date   VD25OH 66 11/30/2022   VD25OH 36 09/23/2021   VD25OH 37 08/31/2018    No results found for: "PREALBUMIN"    Latest Ref Rng & Units 05/30/2023    8:32 AM 11/30/2022   11:20 AM 05/26/2022    9:33 AM  CBC EXTENDED  WBC 3.8 - 10.8 Thousand/uL 3.7  4.6  3.9   RBC 3.80 - 5.10 Million/uL 4.27  4.42  4.53   Hemoglobin 11.7 - 15.5 g/dL 03.5  00.9  38.1   HCT 35.0 - 45.0 % 40.4  41.5  44.3   Platelets 140 - 400 Thousand/uL 265  273  311   NEUT# 1,500 - 7,800 cells/uL 1,828  2,148  1,903   Lymph# 850 - 3,900 cells/uL 1,373  1,794  1,478      There is no height or weight on file to calculate BMI.  Orders:  No orders of the defined types were placed in this encounter.  No orders of the defined types were placed in this encounter.    Procedures: No procedures performed  Clinical Data: No additional findings.  ROS:  All  other systems negative, except as noted in the HPI. Review of Systems  Objective: Vital Signs: There were no vitals taken for this visit.  Specialty Comments:  No specialty comments available.  PMFS History: Patient Active Problem List   Diagnosis Date Noted   Partial nontraumatic tear of right rotator cuff 12/22/2022   Left hamstring injury 07/15/2022   Labral tear of shoulder, degenerative, right 06/21/2022   Left carpal tunnel syndrome 05/26/2022   Foot sprain, left, initial encounter 05/19/2022   Baker's cyst of knee, left 04/21/2022   Right carpal tunnel syndrome 03/16/2022   Flushing 04/14/2021   Hypermobility syndrome 11/27/2020   Seasonal and perennial allergic rhinitis 11/06/2018   Mild intermittent asthma without complication 11/06/2018   Flexural atopic dermatitis 11/06/2018   History of hyperlipidemia 11/08/2016   History of anxiety 11/08/2016    History of retinal tear 11/08/2016   History of Bilateral plantar fasciitis 08/10/2016   High risk medication use 07/04/2016   Lupus (HCC) 05/23/2016   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HCC) 03/18/2016   ADHD (attention deficit hyperactivity disorder) 12/22/2014   Raynaud's phenomenon 10/17/2013   Hyperlipidemia 09/28/2011   Past Medical History:  Diagnosis Date   ADD (attention deficit disorder)    Allergy    Anemia    Anorexia nervosa with bulimia    Per [facility] New Patient Packet   Anxiety    Asthma    Carpal tunnel syndrome    Per [facility] Packet   Depression    Dry eyes    Per [facility] Packet   Dry mouth    Per [facility] Packet   Fibroids    Per [facility]   History of hip x-ray 2021   Per [facility] Packet   History of Papanicolaou smear of cervix 2021   Per [facility] New Patient Packet, Dr.Pickens   Hyperlipidemia    Ileitis    Lupus    PONV (postoperative nausea and vomiting)    mild nausea    Raynaud disease    Retinal tear    Per [facility] New Patient Packet   Systemic lupus erythematosus (HCC)     Family History  Problem Relation Age of Onset   Heart disease Mother 21       CAD/stenting   Hyperlipidemia Mother    Arthritis Mother        ostearthritis   Hypertension Mother    Kidney disease Mother    Irritable bowel syndrome Mother    Stroke Mother 53       CVA   Fibromyalgia Mother    Eczema Mother    Sjogren's syndrome Mother    Hyperlipidemia Father    Neuropathy Father    Allergies Father        shellfish   Prostatitis Father    Hyperlipidemia Sister    ADD / ADHD Sister    Bipolar disorder Sister    Depression Sister    Hypertension Brother    OCD Brother    Anxiety disorder Daughter    ADD / ADHD Daughter    Asthma Daughter    OCD Son    Colon cancer Neg Hx    Inflammatory bowel disease Neg Hx    Allergic rhinitis Neg Hx    Angioedema Neg Hx    Atopy Neg Hx    Immunodeficiency Neg Hx    Urticaria Neg Hx     Past  Surgical History:  Procedure Laterality Date   ABLATION  2022   CARPAL TUNNEL RELEASE Right    08/2019   CESAREAN SECTION  2003   CESAREAN SECTION  2005   Per [facility] New Patient Packet   COLONOSCOPY  2017   Dr.Jacobs   cyst drained Left 2023   knee   DENTAL SURGERY  2009   Fromed tooth and chronic infection   DILITATION & CURRETTAGE/HYSTROSCOPY WITH NOVASURE ABLATION N/A 06/03/2020   Procedure: DILATATION & CURETTAGE/HYSTEROSCOPY WITH NOVASURE ABLATION;  Surgeon: [place] Bing, MD;  Location: [facility];  Service: Gynecology;  Laterality: N/A;   ENDOMETRIAL BIOPSY  04/20/2020       EYE SURGERY Bilateral    cryoretinopexy   hang nail procedure Left 2024   great toe   Social History   Occupational History   Not on file  Tobacco Use   Smoking status: Former    Current packs/day: 0.00    Average packs/day: 0.8 packs/day for 10.0 years (7.5 ttl pk-yrs)    Types: Cigarettes    Start date: 07/06/1991    Quit date: 07/05/2001    Years since quitting: 21.9    Passive exposure: Never   Smokeless tobacco: Never  Vaping Use   Vaping status: Never Used  Substance and Sexual Activity   Alcohol use: Yes    Comment: occ   Drug use: Never   Sexual activity: Yes    Birth control/protection: Condom

## 2023-06-15 ENCOUNTER — Encounter: Payer: BC Managed Care – PPO | Admitting: Physician Assistant

## 2023-06-16 ENCOUNTER — Encounter: Payer: Self-pay | Admitting: Sports Medicine

## 2023-06-16 ENCOUNTER — Ambulatory Visit: Payer: BC Managed Care – PPO | Admitting: Sports Medicine

## 2023-06-16 ENCOUNTER — Other Ambulatory Visit: Payer: Self-pay

## 2023-06-16 DIAGNOSIS — M25552 Pain in left hip: Secondary | ICD-10-CM

## 2023-06-16 DIAGNOSIS — M357 Hypermobility syndrome: Secondary | ICD-10-CM | POA: Diagnosis not present

## 2023-06-16 DIAGNOSIS — M25452 Effusion, left hip: Secondary | ICD-10-CM

## 2023-06-16 DIAGNOSIS — R29898 Other symptoms and signs involving the musculoskeletal system: Secondary | ICD-10-CM

## 2023-06-16 DIAGNOSIS — S73192D Other sprain of left hip, subsequent encounter: Secondary | ICD-10-CM | POA: Diagnosis not present

## 2023-06-16 MED ORDER — METHYLPREDNISOLONE ACETATE 40 MG/ML IJ SUSP
80.0000 mg | INTRAMUSCULAR | Status: AC | PRN
Start: 2023-06-16 — End: 2023-06-16
  Administered 2023-06-16: 80 mg via INTRA_ARTICULAR

## 2023-06-16 MED ORDER — LIDOCAINE HCL 1 % IJ SOLN
4.0000 mL | INTRAMUSCULAR | Status: AC | PRN
Start: 2023-06-16 — End: 2023-06-16
  Administered 2023-06-16: 4 mL

## 2023-06-16 NOTE — Progress Notes (Signed)
Robin [name] - 52 y.o. female MRN [id number]  Date of birth: [birth year]  Office Visit Note: Visit Date: 06/16/2023 PCP: Caesar Bookman, NP Referred by: Tarry Kos, MD  Subjective: Chief Complaint  Patient presents with   Left Hip - Pain   HPI: Robin [name] is a pleasant 52 y.o. female who presents today for acute on chronic left hip pain as well as back/buttock and left leg pain.  Was seen at [facility] this past week and They took xrays and prescribed steroids, muscle relaxer, and tylenol.  She does feel somewhat better after rest and taking some of these medications, just finished steroids but certainly still having pain.  At [facility] they did obtain x-rays of the hip and the pelvis and told her there is no acute findings but age-appropriate degenerative change.  Has had a labral tear in the past, feels somewhat like this but also having pain in the buttock and the insertion of the hamstrings left side.  Pain also radiating down the leg but not past the knee.  She has pain in the hip before and we have given intra-articular hip injection.  But I have never seen her have low back/buttock pain which is newer for her.  Pertinent ROS were reviewed with the patient and found to be negative unless otherwise specified above in HPI.   Assessment & Plan: Visit Diagnoses:  1. Pain in left hip   2. Hip joint effusion, left   3. Hypermobility syndrome   4. Tear of left acetabular labrum, subsequent encounter   5. Hip tightness    Plan: Discussed with Takeia the nature of her pain.  Discussed that I think the origin of her pain is emanating from the left hip in light of a likely labral tear.  Limited ultrasound did show a small to moderate hip effusion as well as what appeared to be some cartilage within the joint recess.  Through shared decision making, we did proceed with ultrasound-guided intra-articular hip injection, patient tolerated well.  I do think her labral issue is  emanating from her joint hypermobility syndrome.  She does have notable soft tissue and hip stabilization tightness which I think is more compensatory from her hip pathology.  She is getting started in formalized physical therapy, I do think she would respond well to dry needling.  I also think movement in the form of aquatic therapy would be better for her, this was edited for her PT notes today.  Did discuss stabilization PT for her joint hypermobility, as well as body braid.  She will continue her chronic naltrexone 6 mg daily, she has completed and will finish her prednisone but may continue with her muscle relaxer once to twice daily as needed.  I do think that the intra-articular hip injection will significantly help her pain, but if she is not having good improvement after the next 7-10 days, she will notify me and we may consider ultrasounding the hip to see if we do need to proceed with aspiration.  She is agreeable and understanding.  Follow-up: Return for Message me in 1-week of improvement.   Meds & Orders: No orders of the defined types were placed in this encounter.   Orders Placed This Encounter  Procedures   Large Joint Inj: L hip joint   US Guided Needle Placement - No Linked Charges     Procedures: Large Joint Inj: L hip joint on 06/16/2023 9:40 AM Indications: pain Details: 22 G 3.5 in  needle, ultrasound-guided anterior approach Medications: 4 mL lidocaine 1 %; 80 mg methylPREDNISolone acetate 40 MG/ML Outcome: tolerated well, no immediate complications  Procedure: US-guided intra-articular hip injection, left After discussion on risks/benefits/indications and informed verbal consent was obtained, a timeout was performed. Patient was lying supine on exam table. The hip was cleaned with betadine and alcohol swabs. Then utilizing ultrasound guidance, the patient's femoral head and neck junction was identified and subsequently injected with 4:2 lidocaine:depomedrol via an in-plane  approach with ultrasound visualization of the injectate administered into the hip joint. Patient tolerated procedure well without immediate complications.  Procedure, treatment alternatives, risks and benefits explained, specific risks discussed. Consent was given by the patient. Immediately prior to procedure a time out was called to verify the correct patient, procedure, equipment, support staff and site/side marked as required. Patient was prepped and draped in the usual sterile fashion.          Clinical History: No specialty comments available.  She reports that she quit smoking about 21 years ago. Her smoking use included cigarettes. She started smoking about 31 years ago. She has a 7.5 pack-year smoking history. She has never been exposed to tobacco smoke. She has never used smokeless tobacco. No results for input(s): "HGBA1C", "LABURIC" in the last 8760 hours.  Objective:    Physical Exam  Gen: Well-appearing, in no acute distress; non-toxic CV:  Well-perfused. Warm.  Resp: Breathing unlabored on room air; no wheezing. Psych: Fluid speech in conversation; appropriate affect; normal thought process Neuro: Sensation intact throughout. No gross coordination deficits.   Ortho Exam - Left hip: No bony TTP or greater trochanter TTP.  There is pain with endrange flexion as well as internal logroll without bony restriction.  Markedly positive FADIR test, equivocal FABER test.  - Back/SI joint: No midline spinous process TTP.  There is some tenderness to palpation over the left SI joint, positive Fortin's point test on the left.  No restriction with flexion or extension of the spine.  Imaging:  [place]:    *Patient did get report of her hip and pelvic x-rays from [facility] from 2 weeks ago, age-appropriate degenerative change, no acute fracture noted.  *Independent review and interpretation of left hip x-rays from 09/01/2020 were worn by myself today.  There is some mild left greater  than right SI joint sclerosis, no significant hip arthritic change or acute bony abnormality.  Past Medical/Family/Surgical/Social History: Medications & Allergies reviewed per EMR, new medications updated. Patient Active Problem List   Diagnosis Date Noted   Partial nontraumatic tear of right rotator cuff 12/22/2022   Left hamstring injury 07/15/2022   Labral tear of shoulder, degenerative, right 06/21/2022   Left carpal tunnel syndrome 05/26/2022   Foot sprain, left, initial encounter 05/19/2022   Baker's cyst of knee, left 04/21/2022   Right carpal tunnel syndrome 03/16/2022   Flushing 04/14/2021   Hypermobility syndrome 11/27/2020   Seasonal and perennial allergic rhinitis 11/06/2018   Mild intermittent asthma without complication 11/06/2018   Flexural atopic dermatitis 11/06/2018   History of hyperlipidemia 11/08/2016   History of anxiety 11/08/2016   History of retinal tear 11/08/2016   History of Bilateral plantar fasciitis 08/10/2016   High risk medication use 07/04/2016   Lupus (HCC) 05/23/2016   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HCC) 03/18/2016   ADHD (attention deficit hyperactivity disorder) 12/22/2014   Raynaud's phenomenon 10/17/2013   Hyperlipidemia 09/28/2011   Past Medical History:  Diagnosis Date   ADD (attention deficit disorder)    Allergy  Anemia    Anorexia nervosa with bulimia    Per [facility] New Patient Packet   Anxiety    Asthma    Carpal tunnel syndrome    Per [facility] New Patient Packet   Depression    Dry eyes    Per [facility] New Patient Packet   Dry mouth    Per [facility] New Patient Packet   Fibroids    Per [facility] New Patient Packet   History of hip x-ray 2021   Per [facility] New Patient Packet   History of Papanicolaou smear of cervix 2021   Per [facility] New Patient Packet, Dr.Pickens   Hyperlipidemia    Ileitis    Lupus    PONV (postoperative nausea and vomiting)    mild nausea    Raynaud disease    Retinal tear    Per [facility] New Patient Packet   Systemic lupus erythematosus  (HCC)    Family History  Problem Relation Age of Onset   Heart disease Mother 16       CAD/stenting   Hyperlipidemia Mother    Arthritis Mother        ostearthritis   Hypertension Mother    Kidney disease Mother    Irritable bowel syndrome Mother    Stroke Mother 20       CVA   Fibromyalgia Mother    Eczema Mother    Sjogren's syndrome Mother    Hyperlipidemia Father    Neuropathy Father    Allergies Father        shellfish   Prostatitis Father    Hyperlipidemia Sister    ADD / ADHD Sister    Bipolar disorder Sister    Depression Sister    Hypertension Brother    OCD Brother    Anxiety disorder Daughter    ADD / ADHD Daughter    Asthma Daughter    OCD Son    Colon cancer Neg Hx    Inflammatory bowel disease Neg Hx    Allergic rhinitis Neg Hx    Angioedema Neg Hx    Atopy Neg Hx    Immunodeficiency Neg Hx    Urticaria Neg Hx    Past Surgical History:  Procedure Laterality Date   ABLATION  2022   CARPAL TUNNEL RELEASE Right    08/2019   CESAREAN SECTION  2003   CESAREAN SECTION  2005   Per [facility] New Patient Packet   COLONOSCOPY  2017   Dr.Jacobs   cyst drained Left 2023   knee   DENTAL SURGERY  2009   Fromed tooth and chronic infection   DILITATION & CURRETTAGE/HYSTROSCOPY WITH NOVASURE ABLATION N/A 06/03/2020   Procedure: DILATATION & CURETTAGE/HYSTEROSCOPY WITH NOVASURE ABLATION;  Surgeon: Terra [place] Bing, MD;  Location: [facility];  Service: Gynecology;  Laterality: N/A;   ENDOMETRIAL BIOPSY  04/20/2020       EYE SURGERY Bilateral    cryoretinopexy   hang nail procedure Left 2024   great toe   Social History   Occupational History   Not on file  Tobacco Use   Smoking status: Former    Current packs/day: 0.00    Average packs/day: 0.8 packs/day for 10.0 years (7.5 ttl pk-yrs)    Types: Cigarettes    Start date: 07/06/1991    Quit date: 07/05/2001    Years since quitting: 21.9    Passive exposure: Never   Smokeless tobacco:  Never  Vaping Use   Vaping status: Never Used  Substance and Sexual  Activity   Alcohol use: Yes    Comment: occ   Drug use: Never   Sexual activity: Yes    Birth control/protection: Condom

## 2023-06-19 ENCOUNTER — Ambulatory Visit (INDEPENDENT_AMBULATORY_CARE_PROVIDER_SITE_OTHER): Payer: BC Managed Care – PPO | Admitting: Physical Therapy

## 2023-06-19 ENCOUNTER — Other Ambulatory Visit: Payer: Self-pay

## 2023-06-19 ENCOUNTER — Encounter: Payer: Self-pay | Admitting: Physical Therapy

## 2023-06-19 DIAGNOSIS — M25552 Pain in left hip: Secondary | ICD-10-CM

## 2023-06-19 DIAGNOSIS — M79605 Pain in left leg: Secondary | ICD-10-CM | POA: Diagnosis not present

## 2023-06-19 DIAGNOSIS — M6281 Muscle weakness (generalized): Secondary | ICD-10-CM

## 2023-06-19 DIAGNOSIS — R262 Difficulty in walking, not elsewhere classified: Secondary | ICD-10-CM | POA: Diagnosis not present

## 2023-06-19 NOTE — Therapy (Signed)
OUTPATIENT PHYSICAL THERAPY LOWER EXTREMITY EVALUATION   Patient Name: Robin [name] MRN: [id number] DOB:04/23/1971, 52 y.o., female Today's Date: 06/19/2023  END OF SESSION:  PT End of Session - 06/19/23 1309     Visit Number 1    Number of Visits 20    Date for PT Re-Evaluation 09/01/23    PT Start Time 1100    PT Stop Time 1145    PT Time Calculation (min) 45 min    Activity Tolerance Patient tolerated treatment well    Behavior During Therapy WFL for tasks assessed/performed             Past Medical History:  Diagnosis Date   ADD (attention deficit disorder)    Allergy    Anemia    Anorexia nervosa with bulimia    Per [facility] Patient [facility]   Anxiety    Asthma    Carpal tunnel syndrome    Per [facility]   Depression    Dry eyes    Per [facility]   Dry mouth    Per [facility]   Fibroids    Per [facility] Patient Packet   History of hip x-ray 2021   Per [facility]   History of Papanicolaou smear of cervix 2021   Per [facility] New Patient Packet, Dr.Pickens   Hyperlipidemia    Ileitis    Lupus    PONV (postoperative nausea and vomiting)    mild nausea    Raynaud disease    Retinal tear    Per [facility] New Patient Packet   Systemic lupus erythematosus (HCC)    Past Surgical History:  Procedure Laterality Date   ABLATION  2022   CARPAL TUNNEL RELEASE Right    08/2019   CESAREAN SECTION  2003   CESAREAN SECTION  2005   Per [facility] New Patient Packet   COLONOSCOPY  2017   Dr.Jacobs   cyst drained Left 2023   knee   DENTAL SURGERY  2009   Fromed tooth and chronic infection   DILITATION & CURRETTAGE/HYSTROSCOPY WITH NOVASURE ABLATION N/A 06/03/2020   Procedure: DILATATION & CURETTAGE/HYSTEROSCOPY WITH NOVASURE ABLATION;  Surgeon: [place] Bing, MD;  Location: [facility];  Service: Gynecology;  Laterality: N/A;   ENDOMETRIAL BIOPSY  04/20/2020       EYE SURGERY Bilateral    cryoretinopexy   hang  nail procedure Left 2024   great toe   Patient Active Problem List   Diagnosis Date Noted   Partial nontraumatic tear of right rotator cuff 12/22/2022   Left hamstring injury 07/15/2022   Labral tear of shoulder, degenerative, right 06/21/2022   Left carpal tunnel syndrome 05/26/2022   Foot sprain, left, initial encounter 05/19/2022   Baker's cyst of knee, left 04/21/2022   Right carpal tunnel syndrome 03/16/2022   Flushing 04/14/2021   Hypermobility syndrome 11/27/2020   Seasonal and perennial allergic rhinitis 11/06/2018   Mild intermittent asthma without complication 11/06/2018   Flexural atopic dermatitis 11/06/2018   History of hyperlipidemia 11/08/2016   History of anxiety 11/08/2016   History of retinal tear 11/08/2016   History of Bilateral plantar fasciitis 08/10/2016   High risk medication use 07/04/2016   Lupus (HCC) 05/23/2016   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HCC) 03/18/2016   ADHD (attention deficit hyperactivity disorder) 12/22/2014   Raynaud's phenomenon 10/17/2013   Hyperlipidemia 09/28/2011    PCP: Caesar Bookman, NP   REFERRING PROVIDER: Tarry Kos, MD  REFERRING DIAG:  Diagnosis  M25.552 (ICD-10-CM) - Pain in left hip    THERAPY DIAG:  Pain in left hip  Pain in left leg  Difficulty in walking, not elsewhere classified  Muscle weakness (generalized)  Rationale for Evaluation and Treatment: Rehabilitation  ONSET DATE:   SUBJECTIVE:   SUBJECTIVE STATEMENT: Pt arriving today s/p intra-articular hip injection due to increased hip pain. Pt also with hypermobility syndrome.  Pt stating since her hip injection pain has dropped to 4/10, with maximum pain at 6/10. Prior to her injection pt reporting pain of 9/10.   PERTINENT HISTORY: Pt s/p intra-articular hip injection, ADD, allergies, asthma, depression, carpal tunnel syndrome, anxiety, lupus, raynaud disease, retinal tear  PAIN:  NPRS scale: 4 /10 Pain location: left hip  Pain description:  achy Aggravating factors: sitting prolonged, stairs, lying on left side, lateral movements Relieving factors: kneeling, heat, over the counter meds prior to injection, flexril  PRECAUTIONS: None  WEIGHT BEARING RESTRICTIONS: No  FALLS:  Has patient fallen in last 6 months? Yes. 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   PLOF: Independent  PATIENT GOALS: "Build strength and stability in hip and left side and be able to increase amt of time walking and on my feet."   Next MD visit:   OBJECTIVE:   DIAGNOSTIC FINDINGS: X-rays pending form [facility]  PATIENT SURVEYS:  06/19/23: FOTO intake:   22%  COGNITION: Overall cognitive status: WFL    SENSATION: WFL  MUSCLE LENGTH: Hamstrings: Right 90 deg; Left 80 deg (opposite leg straight)  POSTURE:  rounded shoulders and forward head  PALPATION: TTP: lateral hip, left IT band, left SI joint, left piriformis  LOWER EXTREMITY ROM:   ROM Right 06/19/23 Left 06/19/23  Hip flexion 125 122  Hip extension    Hip abduction    Hip adduction    Hip internal rotation 42 30  Hip external rotation 65 40  Knee flexion    Knee extension    Ankle dorsiflexion    Ankle plantarflexion    Ankle inversion    Ankle eversion     (Blank rows = not tested)  LOWER EXTREMITY MMT:  MMT Right 06/19/23 Left 06/19/23  Hip flexion 5 3  Hip extension    Hip abduction 5 3-  Hip adduction 5 4-  Hip internal rotation    Hip external rotation    Knee flexion    Knee extension 5 4  Ankle dorsiflexion 5 4  Ankle plantarflexion    Ankle inversion    Ankle eversion     (Blank rows = not tested)  LOWER EXTREMITY SPECIAL TESTS:  Hip special tests: Luisa Hart (FABER) test: positive  left   FUNCTIONAL TESTS:  06/19/23: 5  time sit to stand: 19.3 second no UE support  GAIT: Distance walked: clinic distances Assistive device utilized: None Level of assistance: Complete Independence Comments: appears normal  TODAY'S TREATMENT                                                                          DATE: 06/19/23 Therex:    HEP instruction/performance c cues for techniques, handout provided.  Trial set performed of each for comprehension and symptom assessment.  See below for exercise list  PATIENT EDUCATION:  Education details: HEP, POC Person educated: Patient Education method: Explanation, Demonstration, Verbal cues, and Handouts Education comprehension: verbalized understanding, returned demonstration, and verbal cues required  HOME EXERCISE PROGRAM: Access Code: [email or web address] URL: [email or web address] Date: 06/19/2023 Prepared by: Narda Amber  Exercises - Supine Bridge  - 1-2 x daily - 7 x weekly - 3 sets - 10 reps - 5 seconds hold - Supine ITB Stretch with Strap  - 1-2 x daily - 7 x weekly - 3-5 reps - 20-30 seconds hold - Sidelying Reverse Clamshell  - 1-2 x daily - 7 x weekly - 2 sets - 10 reps - Clam with Resistance  - 1-2 x daily - 7 x weekly - 2 sets - 10 reps - Supine Piriformis Stretch with Foot on Ground  - 1-2 x daily - 7 x weekly - 3-5 reps - 20-30 seconds hold - Supine Figure 4 Piriformis Stretch  - 1-2 x daily - 7 x weekly - 10 reps - 20-30 seconds hold  ASSESSMENT:  CLINICAL IMPRESSION: Patient is a 52 y.o. who comes to clinic with complaints of left hip pain with possible labral tear. Pt with weakness in her left LE which has limited her ADLs and functional mobility. Pt presenting with mobility, strength and movement coordination deficits that impair their ability to perform usual daily and recreational  functional activities without increase difficulty/symptoms at this time.  Patient to benefit from skilled PT services to address impairments and limitations to improve to previous level of function without restriction secondary to condition.   OBJECTIVE IMPAIRMENTS: decreased balance, decreased mobility, difficulty walking, decreased ROM, decreased strength, and pain.   ACTIVITY LIMITATIONS: lifting, bending, sitting, standing, squatting, sleeping, stairs, and transfers  PARTICIPATION LIMITATIONS: cleaning, laundry, driving, shopping, community activity, occupation, and yard work  PERSONAL FACTORS:  see pertinent history above  are also affecting patient's functional outcome.   REHAB POTENTIAL: Good  CLINICAL DECISION MAKING: Stable/uncomplicated  EVALUATION COMPLEXITY: Low   GOALS: Goals reviewed with patient? Yes  SHORT TERM GOALS: (target date for Short term goals are 3 weeks 07/14/23)   1.  Patient will demonstrate independent use of home exercise program to maintain progress from in clinic treatments.  Goal status: New  LONG TERM GOALS: (target dates for all long term goals are 10 weeks  09/01/23 )   1. Patient will demonstrate/report pain at worst less than or equal to 2/10 to facilitate minimal limitation in daily activity secondary to pain symptoms.  Goal status: New   2. Patient will demonstrate independent use of home exercise program to facilitate ability to maintain/progress functional gains from skilled physical therapy services.  Goal status: New   3. Patient will demonstrate FOTO outcome > or = 51 % to indicate reduced disability due to condition.  Goal status: New   4.  Patient will demonstrate left  LE MMT 5/5  throughout to faciltiate usual transfers, stairs, squatting at [facility] for daily life.   Goal status: New   5.  Patient will demonstrate ability to navigate up and down 5 steps with single hand rail with reciprocal gait pattern.  Goal status: New   6.   Pt will improve her 5 time sit to stand to </= 10 sec with no UE support in order to improve functional mobility.  Goal status: New      PLAN:  PT FREQUENCY: 1-2x/week  PT DURATION: 10 weeks  PLANNED INTERVENTIONS: Therapeutic exercises, Therapeutic activity, Neuro Muscular re-education, Balance training, Gait training, Patient/Family education, Joint mobilization, Stair training, DME instructions, Dry Needling, Electrical stimulation, Traction, Cryotherapy, vasopneumatic deviceMoist heat, Taping, Ultrasound, Ionotophoresis 4mg /ml Dexamethasone, and aquatic therapy, Manual therapy.  All included unless contraindicated  PLAN FOR NEXT SESSION: Review HEP knowledge/results, LE gentle strengthening to pt's tolerance, stretching, modalities as needed      Sharmon Leyden, PT, MPT 06/19/2023, 1:11 PM

## 2023-06-26 ENCOUNTER — Encounter: Payer: BC Managed Care – PPO | Admitting: Physical Therapy

## 2023-07-03 ENCOUNTER — Encounter: Payer: Self-pay | Admitting: Sports Medicine

## 2023-07-10 ENCOUNTER — Ambulatory Visit: Payer: BC Managed Care – PPO | Admitting: Physical Therapy

## 2023-07-10 ENCOUNTER — Encounter: Payer: Self-pay | Admitting: Physical Therapy

## 2023-07-10 DIAGNOSIS — M79605 Pain in left leg: Secondary | ICD-10-CM

## 2023-07-10 DIAGNOSIS — R262 Difficulty in walking, not elsewhere classified: Secondary | ICD-10-CM

## 2023-07-10 DIAGNOSIS — M6281 Muscle weakness (generalized): Secondary | ICD-10-CM | POA: Diagnosis not present

## 2023-07-10 DIAGNOSIS — M25552 Pain in left hip: Secondary | ICD-10-CM | POA: Diagnosis not present

## 2023-07-10 NOTE — Therapy (Signed)
OUTPATIENT PHYSICAL THERAPY LOWER EXTREMITY EVALUATION   Patient Name: Robin [name] MRN: [id number] DOB:11/20/1970, 52 y.o., female Today's Date: 07/10/2023  END OF SESSION:  PT End of Session - 07/10/23 1307     Visit Number 2    Number of Visits 20    Date for PT Re-Evaluation 09/01/23    PT Start Time 1303    PT Stop Time 1343    PT Time Calculation (min) 40 min    Activity Tolerance Patient tolerated treatment well    Behavior During Therapy WFL for tasks assessed/performed              Past Medical History:  Diagnosis Date   ADD (attention deficit disorder)    Allergy    Anemia    Anorexia nervosa with bulimia    Per [facility] New Patient Packet   Anxiety    Asthma    Carpal tunnel syndrome    Per [facility] New Patient Packet   Depression    Dry eyes    Per [facility] New Patient Packet   Dry mouth    Per [facility] New Patient Packet   Fibroids    Per [facility] New Patient Packet   History of hip x-ray 2021   Per [facility] New Patient Packet   History of Papanicolaou smear of cervix 2021   Per [facility] New Patient Packet, Dr.Pickens   Hyperlipidemia    Ileitis    Lupus    PONV (postoperative nausea and vomiting)    mild nausea    Raynaud disease    Retinal tear    Per [facility] New Patient Packet   Systemic lupus erythematosus (HCC)    Past Surgical History:  Procedure Laterality Date   ABLATION  2022   CARPAL TUNNEL RELEASE Right    08/2019   CESAREAN SECTION  2003   CESAREAN SECTION  2005   Per [facility] New Patient Packet   COLONOSCOPY  2017   Dr.Jacobs   cyst drained Left 2023   knee   DENTAL SURGERY  2009   Fromed tooth and chronic infection   DILITATION & CURRETTAGE/HYSTROSCOPY WITH NOVASURE ABLATION N/A 06/03/2020   Procedure: DILATATION & CURETTAGE/HYSTEROSCOPY WITH NOVASURE ABLATION;  Surgeon: [place] Bing, MD;  Location: [facility];  Service: Gynecology;  Laterality: N/A;   ENDOMETRIAL BIOPSY  04/20/2020       EYE SURGERY Bilateral    cryoretinopexy    hang nail procedure Left 2024   great toe   Patient Active Problem List   Diagnosis Date Noted   Partial nontraumatic tear of right rotator cuff 12/22/2022   Left hamstring injury 07/15/2022   Labral tear of shoulder, degenerative, right 06/21/2022   Left carpal tunnel syndrome 05/26/2022   Foot sprain, left, initial encounter 05/19/2022   Baker's cyst of knee, left 04/21/2022   Right carpal tunnel syndrome 03/16/2022   Flushing 04/14/2021   Hypermobility syndrome 11/27/2020   Seasonal and perennial allergic rhinitis 11/06/2018   Mild intermittent asthma without complication 11/06/2018   Flexural atopic dermatitis 11/06/2018   History of hyperlipidemia 11/08/2016   History of anxiety 11/08/2016   History of retinal tear 11/08/2016   History of Bilateral plantar fasciitis 08/10/2016   High risk medication use 07/04/2016   Lupus (HCC) 05/23/2016   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HCC) 03/18/2016   ADHD (attention deficit hyperactivity disorder) 12/22/2014   Raynaud's phenomenon 10/17/2013   Hyperlipidemia 09/28/2011    PCP: Caesar Bookman, NP   REFERRING PROVIDER: Tarry Kos,  MD   REFERRING DIAG:  Diagnosis  M25.552 (ICD-10-CM) - Pain in left hip    THERAPY DIAG:  Pain in left hip  Pain in left leg  Difficulty in walking, not elsewhere classified  Muscle weakness (generalized)  Rationale for Evaluation and Treatment: Rehabilitation  ONSET DATE:   SUBJECTIVE:   SUBJECTIVE STATEMENT: Pt stating pain of 4/10 upon arrival down her left hip and into thigh. Pt reporting taking longer strides when walking aggravates her pain. Pt also reporting some soreness after performing her HEP.   PERTINENT HISTORY: Pt s/p intra-articular hip injection, ADD, allergies, asthma, depression, carpal tunnel syndrome, anxiety, lupus, raynaud disease, retinal tear  PAIN:  NPRS scale: 4 /10 upon arrival Pain location: left hip  Pain description: achy Aggravating factors: sitting prolonged,  stairs, lying on left side, lateral movements Relieving factors: kneeling, heat, over the counter meds prior to injection, flexril  PRECAUTIONS: None  WEIGHT BEARING RESTRICTIONS: No  FALLS:  Has patient fallen in last 6 months? Yes. 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   PLOF: Independent  PATIENT GOALS: "Build strength and stability in hip and left side and be able to increase amt of time walking and on my feet."   Next MD visit:   OBJECTIVE:   DIAGNOSTIC FINDINGS: X-rays pending form [facility]  PATIENT SURVEYS:  06/19/23: FOTO intake:   22%  COGNITION: Overall cognitive status: WFL    SENSATION: WFL  MUSCLE LENGTH: Hamstrings: Right 90 deg; Left 80 deg (opposite leg straight)  POSTURE:  rounded shoulders and forward head  PALPATION: TTP: lateral hip, left IT band, left SI joint, left piriformis  LOWER EXTREMITY ROM:   ROM Right 06/19/23 Left 06/19/23  Hip flexion 125 122  Hip extension    Hip abduction    Hip adduction    Hip internal rotation 42 30  Hip external rotation 65 40  Knee flexion    Knee extension    Ankle dorsiflexion    Ankle plantarflexion    Ankle inversion    Ankle eversion     (Blank rows = not tested)  LOWER EXTREMITY MMT:  MMT Right 06/19/23 Left 06/19/23  Hip flexion 5 3  Hip extension    Hip abduction 5 3-  Hip adduction 5 4-  Hip internal rotation    Hip external rotation    Knee flexion    Knee extension 5 4  Ankle dorsiflexion 5 4  Ankle plantarflexion    Ankle inversion    Ankle eversion     (Blank rows = not tested)  LOWER EXTREMITY SPECIAL TESTS:  Hip special tests: Luisa Hart (FABER) test: positive  left   FUNCTIONAL TESTS:  06/19/23: 5 time sit to stand: 19.3 second no UE  support  GAIT: Distance walked: clinic distances Assistive device utilized: None Level of assistance: Complete Independence Comments: appears normal  TODAY'S TREATMENT                                                                           DATE: 06/19/23 Therex: Supine bridge: x 10 holding 5 sec Supine bridge: walking feet in/out x 10 Piriformis stretch: x 2 bil LE holding 30 sec Glute stretch: x 2 bil LE holding 30 sec  Standing hip flexor stretch: x 4 holding 20-30 sec Recumbent bike level 3 following DN x 5 minutes Manual:  Trigger Point Dry-Needling  Treatment instructions: Expect mild to moderate muscle soreness. S/S of pneumothorax if dry needled over a lung field, and to seek immediate medical attention should they occur. Patient verbalized understanding of these instructions and education.  Patient Consent Given: Yes Education handout provided: Previously provided Muscles treated: glute medius. Left piriformis, Left superior rectus intermedius Electrical stimulation performed: No Parameters: N/A Treatment response/outcome: twitch responses noted, pt with good response      DATE: 06/19/23 Therex:    HEP instruction/performance c cues for techniques, handout provided.  Trial set performed of each for comprehension and symptom assessment.  See below for exercise list  PATIENT EDUCATION:  Education details: HEP, POC Person educated: Patient Education method: Explanation, Demonstration, Verbal cues, and Handouts Education comprehension: verbalized understanding, returned demonstration, and verbal cues required  HOME EXERCISE PROGRAM: Access Code: [email or web address] URL: [email or web address] Date: 06/19/2023 Prepared by: Narda Amber  Exercises - Supine Bridge  - 1-2 x daily - 7 x weekly - 3  sets - 10 reps - 5 seconds hold - Supine ITB Stretch with Strap  - 1-2 x daily - 7 x weekly - 3-5 reps - 20-30 seconds hold - Sidelying Reverse Clamshell  - 1-2 x daily - 7 x weekly - 2 sets - 10 reps - Clam with Resistance  - 1-2 x daily - 7 x weekly - 2 sets - 10 reps - Supine Piriformis Stretch with Foot on Ground  - 1-2 x daily - 7 x weekly - 3-5 reps - 20-30 seconds hold - Supine Figure 4 Piriformis Stretch  - 1-2 x daily - 7 x weekly - 10 reps - 20-30 seconds hold  ASSESSMENT:  CLINICAL IMPRESSION: Pt arriving today reporting pain with longer step lengths when walking. Pt's HEP was reviewed and instructions were given for technique. Pt wishing to Dry Needling today with good tolerance and twitch response.  Recommend continued skilled PT interventions to maximize pt's function.      OBJECTIVE IMPAIRMENTS: decreased balance, decreased mobility, difficulty walking, decreased ROM, decreased strength, and pain.   ACTIVITY LIMITATIONS: lifting, bending, sitting, standing, squatting, sleeping, stairs, and transfers  PARTICIPATION LIMITATIONS: cleaning, laundry, driving, shopping, community activity, occupation, and yard work  PERSONAL FACTORS:  see pertinent history above  are also affecting patient's functional outcome.   REHAB POTENTIAL: Good  CLINICAL DECISION MAKING: Stable/uncomplicated  EVALUATION COMPLEXITY: Low   GOALS: Goals reviewed with patient? Yes  SHORT TERM GOALS: (target date for Short term goals are 3 weeks 07/14/23)   1.  Patient will demonstrate independent use of home exercise program to maintain progress from in clinic treatments.  Goal status: On-going 07/10/23  LONG TERM GOALS: (target dates for all long term goals are 10 weeks  09/01/23 )   1. Patient will demonstrate/report pain at worst less than or equal to 2/10 to facilitate minimal limitation in daily activity secondary to pain symptoms.  Goal status: New   2. Patient will demonstrate  independent use of home exercise program to facilitate ability to maintain/progress functional gains from skilled physical therapy services.  Goal status: New   3. Patient will demonstrate FOTO outcome > or = 51 % to indicate reduced disability due to condition.  Goal status: New   4.  Patient will demonstrate left  LE MMT 5/5 throughout to faciltiate usual transfers, stairs, squatting at [facility] for daily life.   Goal status: New   5.  Patient will demonstrate ability to navigate up and down 5 steps with single hand rail with reciprocal gait pattern.  Goal status: New   6.  Pt will improve her 5 time sit to stand to </= 10 sec with no UE support in order to improve functional mobility.  Goal status: New      PLAN:  PT FREQUENCY: 1-2x/week  PT DURATION: 10 weeks  PLANNED INTERVENTIONS: Therapeutic exercises, Therapeutic activity, Neuro Muscular re-education, Balance training, Gait training, Patient/Family education, Joint mobilization, Stair training, DME instructions, Dry Needling, Electrical stimulation, Traction, Cryotherapy, vasopneumatic deviceMoist heat, Taping, Ultrasound, Ionotophoresis 4mg /ml Dexamethasone, and aquatic therapy, Manual therapy.  All included unless contraindicated  PLAN FOR NEXT SESSION:  LE gentle strengthening to pt's tolerance, stretching, modalities as needed      Sharmon Leyden, PT, MPT 07/10/2023, 1:43 PM

## 2023-07-14 ENCOUNTER — Encounter: Payer: Self-pay | Admitting: Physical Therapy

## 2023-07-14 ENCOUNTER — Ambulatory Visit (INDEPENDENT_AMBULATORY_CARE_PROVIDER_SITE_OTHER): Payer: BC Managed Care – PPO | Admitting: Physical Therapy

## 2023-07-14 DIAGNOSIS — M25552 Pain in left hip: Secondary | ICD-10-CM | POA: Diagnosis not present

## 2023-07-14 DIAGNOSIS — M79605 Pain in left leg: Secondary | ICD-10-CM | POA: Diagnosis not present

## 2023-07-14 DIAGNOSIS — R262 Difficulty in walking, not elsewhere classified: Secondary | ICD-10-CM

## 2023-07-14 DIAGNOSIS — M6281 Muscle weakness (generalized): Secondary | ICD-10-CM | POA: Diagnosis not present

## 2023-07-14 NOTE — Therapy (Signed)
OUTPATIENT PHYSICAL THERAPY TREATMENT   Patient Name: Robin [name] MRN: [id number] DOB:1971-07-01, 52 y.o., female Today's Date: 07/14/2023  END OF SESSION:  PT End of Session - 07/14/23 0819     Visit Number 3    Number of Visits 20    Date for PT Re-Evaluation 09/01/23    PT Start Time 0807    PT Stop Time 0845    PT Time Calculation (min) 38 min    Activity Tolerance Patient tolerated treatment well    Behavior During Therapy [facility] for tasks assessed/performed              Past Medical History:  Diagnosis Date   ADD (attention deficit disorder)    Allergy    Anemia    Anorexia nervosa with bulimia    Per [facility] New Patient Packet   Anxiety    Asthma    Carpal tunnel syndrome    Per [facility] New Patient Packet   Depression    Dry eyes    Per [facility] New Patient Packet   Dry mouth    Per [facility] New Patient Packet   Fibroids    Per [facility] New Patient Packet   History of hip x-ray 2021   Per [facility] New Patient Packet   History of Papanicolaou smear of cervix 2021   Per [facility] New Patient Packet, Dr.Pickens   Hyperlipidemia    Ileitis    Lupus    PONV (postoperative nausea and vomiting)    mild nausea    Raynaud disease    Retinal tear    Per [facility] New Patient Packet   Systemic lupus erythematosus (HCC)    Past Surgical History:  Procedure Laterality Date   ABLATION  2022   CARPAL TUNNEL RELEASE Right    08/2019   CESAREAN SECTION  2003   CESAREAN SECTION  2005   Per [facility] New Patient Packet   COLONOSCOPY  2017   Dr.Jacobs   cyst drained Left 2023   knee   DENTAL SURGERY  2009   Fromed tooth and chronic infection   DILITATION & CURRETTAGE/HYSTROSCOPY WITH NOVASURE ABLATION N/A 06/03/2020   Procedure: DILATATION & CURETTAGE/HYSTEROSCOPY WITH NOVASURE ABLATION;  Surgeon: [place] Bing, MD;  Location: [facility];  Service: Gynecology;  Laterality: N/A;   ENDOMETRIAL BIOPSY  04/20/2020       EYE SURGERY Bilateral    cryoretinopexy   hang nail procedure  Left 2024   great toe   Patient Active Problem List   Diagnosis Date Noted   Partial nontraumatic tear of right rotator cuff 12/22/2022   Left hamstring injury 07/15/2022   Labral tear of shoulder, degenerative, right 06/21/2022   Left carpal tunnel syndrome 05/26/2022   Foot sprain, left, initial encounter 05/19/2022   Baker's cyst of knee, left 04/21/2022   Right carpal tunnel syndrome 03/16/2022   Flushing 04/14/2021   Hypermobility syndrome 11/27/2020   Seasonal and perennial allergic rhinitis 11/06/2018   Mild intermittent asthma without complication 11/06/2018   Flexural atopic dermatitis 11/06/2018   History of hyperlipidemia 11/08/2016   History of anxiety 11/08/2016   History of retinal tear 11/08/2016   History of Bilateral plantar fasciitis 08/10/2016   High risk medication use 07/04/2016   Lupus (HCC) 05/23/2016   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HCC) 03/18/2016   ADHD (attention deficit hyperactivity disorder) 12/22/2014   Raynaud's phenomenon 10/17/2013   Hyperlipidemia 09/28/2011    PCP: Caesar Bookman, NP   REFERRING PROVIDER: Tarry Kos, MD  REFERRING DIAG:  Diagnosis  M25.552 (ICD-10-CM) - Pain in left hip    THERAPY DIAG:  Pain in left hip  Pain in left leg  Difficulty in walking, not elsewhere classified  Muscle weakness (generalized)  Rationale for Evaluation and Treatment: Rehabilitation  ONSET DATE:   SUBJECTIVE:   SUBJECTIVE STATEMENT: Pt stating 3/10 pain in her hip upon arrival  PERTINENT HISTORY: Pt s/p intra-articular hip injection, ADD, allergies, asthma, depression, carpal tunnel syndrome, anxiety, lupus, raynaud disease, retinal tear  PAIN:  NPRS scale: 3 /10 upon arrival Pain location: left hip  Pain description: achy Aggravating factors: sitting prolonged, stairs, lying on left side, lateral movements Relieving factors: kneeling, heat, over the counter meds prior to injection, flexril  PRECAUTIONS: None  WEIGHT BEARING  RESTRICTIONS: No  FALLS:  Has patient fallen in last 6 months? Yes. 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   PLOF: Independent  PATIENT GOALS: "Build strength and stability in hip and left side and be able to increase amt of time walking and on my feet."   Next MD visit:   OBJECTIVE:   DIAGNOSTIC FINDINGS: X-rays pending form [facility]  PATIENT SURVEYS:  06/19/23: FOTO intake:   22%  COGNITION: Overall cognitive status: WFL    SENSATION: WFL  MUSCLE LENGTH: Hamstrings: Right 90 deg; Left 80 deg (opposite leg straight)  POSTURE:  rounded shoulders and forward head  PALPATION: TTP: lateral hip, left IT band, left SI joint, left piriformis  LOWER EXTREMITY ROM:   ROM Right 06/19/23 Left 06/19/23  Hip flexion 125 122  Hip extension    Hip abduction    Hip adduction    Hip internal rotation 42 30  Hip external rotation 65 40  Knee flexion    Knee extension    Ankle dorsiflexion    Ankle plantarflexion    Ankle inversion    Ankle eversion     (Blank rows = not tested)  LOWER EXTREMITY MMT:  MMT Right 06/19/23 Left 06/19/23  Hip flexion 5 3  Hip extension    Hip abduction 5 3-  Hip adduction 5 4-  Hip internal rotation    Hip external rotation    Knee flexion    Knee extension 5 4  Ankle dorsiflexion 5 4  Ankle plantarflexion    Ankle inversion    Ankle eversion     (Blank rows = not tested)  LOWER EXTREMITY SPECIAL TESTS:  Hip special tests: Luisa Hart (FABER) test: positive  left   FUNCTIONAL TESTS:  06/19/23: 5 time sit to stand: 19.3 second no UE support  GAIT: Distance walked: clinic distances Assistive device utilized: None Level of assistance: Complete Independence Comments: appears normal  TODAY'S TREATMENT                                                                           DATE: 07/14/23 Pt seen for aquatic therapy today.  Treatment took place in water 3.5-4.75 ft in depth at the [facility] pool. Temp of water was 91.  Pt entered/exited the pool via stairs with hand rail.   Pt requires the buoyancy and hydrostatic pressure of water for support, and to offload joints by unweighting joint load by at least 50 % in navel deep water and by at least 75-80% in chest to neck deep water.  Viscosity of the water is needed for resistance of strengthening. Water current perturbations provides challenge to standing balance requiring increased core activation. Aquatic PT exercises Sidestepping length of pool 3 round trips with mini squat Forward walking  3 round trips backward walking 3 round trips Sidestepping 3 round trips Tandem walk 3 round trips Leg swings hip abd/add X 15 bilat with UE support with UE support Leg swings hip flexion/extension X 15 bilat March hops in deeper water with UE support X 10 bilat scissor hops in deeper water with UE support X 10 bilat Skater hops in deeper water with UE support X 10 bilat Hip abd to add hops in deeper water with  Step ups onto first step of pool X15 bilat with UE support Hip circles CW and CCW X 15 each bilat Seated on pool chair and cycling and hip abd/add 3 min  06/19/23 Therex: Supine bridge: x 10 holding 5 sec Supine bridge: walking feet in/out x 10 Piriformis stretch: x 2 bil LE holding 30 sec Glute stretch: x 2 bil LE holding 30 sec  Standing hip flexor stretch: x 4 holding 20-30 sec Recumbent bike level 3 following DN x 5 minutes Manual:  Trigger Point Dry-Needling  Treatment instructions: Expect mild to moderate muscle soreness. S/S of pneumothorax if dry needled over  a lung field, and to seek immediate medical attention should they occur. Patient verbalized understanding of these instructions and education.  Patient Consent Given: Yes Education handout provided: Previously provided Muscles treated: glute medius. Left piriformis, Left superior rectus intermedius Electrical stimulation performed: No Parameters: N/A Treatment response/outcome: twitch responses noted, pt with good response      DATE: 06/19/23 Therex:    HEP instruction/performance c cues for techniques, handout provided.  Trial set performed of each for comprehension and symptom assessment.  See below for exercise list  PATIENT EDUCATION:  Education details: HEP, POC Person educated: Patient Education method: Explanation, Demonstration, Verbal cues, and Handouts Education comprehension: verbalized understanding, returned demonstration, and verbal cues required  HOME EXERCISE PROGRAM: Access Code: [email or web address] URL: [email or web address] Date: 06/19/2023 Prepared by: Narda Amber  Exercises - Supine Bridge  - 1-2 x daily - 7 x weekly - 3 sets - 10 reps - 5 seconds hold - Supine ITB Stretch with Strap  - 1-2 x daily - 7 x weekly - 3-5 reps - 20-30 seconds hold - Sidelying Reverse Clamshell  - 1-2 x daily - 7 x weekly - 2 sets - 10 reps - Clam with Resistance  - 1-2 x daily - 7 x weekly - 2 sets - 10  reps - Supine Piriformis Stretch with Foot on Ground  - 1-2 x daily - 7 x weekly - 3-5 reps - 20-30 seconds hold - Supine Figure 4 Piriformis Stretch  - 1-2 x daily - 7 x weekly - 10 reps - 20-30 seconds hold   Access Code: [facility] URL: [email or web address] Date: 07/14/2023 Prepared by: Ivery Quale  Exercises - Standing Hip Flexion Extension at [facility]  - 1 x daily - 2 x weekly - 15 reps - Standing Hip Abduction Adduction at Pool Wall  - 1 x daily - 2 x weekly - 20 reps - Forward Walking  - 1 x daily - 2 x weekly - 4 sets - Backward Walking  - 1 x  daily - 2 x weekly - 4 sets - Side Stepping  - 2 x daily - 2 x weekly - 4 sets - 10 reps - Forward March  - 1 x daily - 2 x weekly - 4 sets - Backward March  - 1 x daily - 2 x weekly - 4 sets - Lunge to [employer] at [employer]  - 1 x daily - 2 x weekly - 1 sets - 20 reps - Alternating Arms Jumping in Place  - 1 x daily - 2 x weekly - 2 sets - 10 reps - lateral hops in water depth you prefer  - 1 x daily - 2 x weekly - 1 sets - 10 reps - Forward Monster Walk  - 1 x daily - 2 x weekly - 4 sets - Standing Single Leg Hip Circles  - 1 x daily - 2 x weekly - 1 sets - 20 reps  ASSESSMENT:  CLINICAL IMPRESSION: She had first aquatic PT session with food overall tolerance. We will monitor for any soreness from this. I I did print her out aquatic HEP program as she does have access to pool.  Recommend continued skilled PT interventions to maximize pt's function.      OBJECTIVE IMPAIRMENTS: decreased balance, decreased mobility, difficulty walking, decreased ROM, decreased strength, and pain.   ACTIVITY LIMITATIONS: lifting, bending, sitting, standing, squatting, sleeping, stairs, and transfers  PARTICIPATION LIMITATIONS: cleaning, laundry, driving, shopping, community activity, occupation, and yard work  PERSONAL FACTORS:  see pertinent history above  are also affecting patient's functional outcome.   REHAB POTENTIAL: Good  CLINICAL DECISION MAKING: Stable/uncomplicated  EVALUATION COMPLEXITY: Low   GOALS: Goals reviewed with patient? Yes  SHORT TERM GOALS: (target date for Short term goals are 3 weeks 07/14/23)   1.  Patient will demonstrate independent use of home exercise program to maintain progress from in clinic treatments.  Goal status: On-going 07/10/23  LONG TERM GOALS: (target dates for all long term goals are 10 weeks  09/01/23 )   1. Patient will demonstrate/report pain at worst less than or equal to 2/10 to facilitate minimal limitation in daily activity secondary to pain  symptoms.  Goal status: New   2. Patient will demonstrate independent use of home exercise program to facilitate ability to maintain/progress functional gains from skilled physical therapy services.  Goal status: New   3. Patient will demonstrate FOTO outcome > or = 51 % to indicate reduced disability due to condition.  Goal status: New   4.  Patient will demonstrate left  LE MMT 5/5 throughout to faciltiate usual transfers, stairs, squatting at [facility] for daily life.   Goal status: New   5.  Patient will demonstrate ability to navigate up and down 5 steps with single  hand rail with reciprocal gait pattern.  Goal status: New   6.  Pt will improve her 5 time sit to stand to </= 10 sec with no UE support in order to improve functional mobility.  Goal status: New      PLAN:  PT FREQUENCY: 1-2x/week  PT DURATION: 10 weeks  PLANNED INTERVENTIONS: Therapeutic exercises, Therapeutic activity, Neuro Muscular re-education, Balance training, Gait training, Patient/Family education, Joint mobilization, Stair training, DME instructions, Dry Needling, Electrical stimulation, Traction, Cryotherapy, vasopneumatic deviceMoist heat, Taping, Ultrasound, Ionotophoresis 4mg /ml Dexamethasone, and aquatic therapy, Manual therapy.  All included unless contraindicated  PLAN FOR NEXT SESSION:  LE gentle strengthening to pt's tolerance, stretching, modalities as needed      April Manson, PT, DPT 07/14/2023, 8:20 AM

## 2023-07-18 ENCOUNTER — Other Ambulatory Visit: Payer: Self-pay | Admitting: Allergy & Immunology

## 2023-07-21 ENCOUNTER — Encounter: Payer: Self-pay | Admitting: Physical Therapy

## 2023-07-21 ENCOUNTER — Ambulatory Visit (INDEPENDENT_AMBULATORY_CARE_PROVIDER_SITE_OTHER): Payer: BC Managed Care – PPO | Admitting: Physical Therapy

## 2023-07-21 DIAGNOSIS — M25552 Pain in left hip: Secondary | ICD-10-CM | POA: Diagnosis not present

## 2023-07-21 DIAGNOSIS — R262 Difficulty in walking, not elsewhere classified: Secondary | ICD-10-CM

## 2023-07-21 DIAGNOSIS — M79605 Pain in left leg: Secondary | ICD-10-CM | POA: Diagnosis not present

## 2023-07-21 DIAGNOSIS — M6281 Muscle weakness (generalized): Secondary | ICD-10-CM

## 2023-07-21 NOTE — Therapy (Addendum)
OUTPATIENT PHYSICAL THERAPY TREATMENT   Patient Name: Robin [name] MRN: [id number] DOB:03-30-1971, 52 y.o., female Today's Date: 07/21/2023  END OF SESSION:  PT End of Session - 07/21/23 1014     Visit Number 4    Number of Visits 20    Date for PT Re-Evaluation 09/01/23    PT Start Time 1010    PT Stop Time 1050    PT Time Calculation (min) 40 min    Activity Tolerance Patient tolerated treatment well    Behavior During Therapy WFL for tasks assessed/performed              Past Medical History:  Diagnosis Date   ADD (attention deficit disorder)    Allergy    Anemia    Anorexia nervosa with bulimia    Per [facility] New Patient Packet   Anxiety    Asthma    Carpal tunnel syndrome    Per [facility] New Patient Packet   Depression    Dry eyes    Per [facility] New Patient Packet   Dry mouth    Per [facility] New Patient Packet   Fibroids    Per [facility] New Patient Packet   History of hip x-ray 2021   Per [facility] New Patient Packet   History of Papanicolaou smear of cervix 2021   Per [facility] New Patient Packet, Dr.Pickens   Hyperlipidemia    Ileitis    Lupus    PONV (postoperative nausea and vomiting)    mild nausea    Raynaud disease    Retinal tear    Per [facility] New Patient Packet   Systemic lupus erythematosus (HCC)    Past Surgical History:  Procedure Laterality Date   ABLATION  2022   CARPAL TUNNEL RELEASE Right    08/2019   CESAREAN SECTION  2003   CESAREAN SECTION  2005   Per [facility] New Patient Packet   COLONOSCOPY  2017   Dr.Jacobs   cyst drained Left 2023   knee   DENTAL SURGERY  2009   Fromed tooth and chronic infection   DILITATION & CURRETTAGE/HYSTROSCOPY WITH NOVASURE ABLATION N/A 06/03/2020   Procedure: DILATATION & CURETTAGE/HYSTEROSCOPY WITH NOVASURE ABLATION;  Surgeon:  Bing, MD;  Location: [facility];  Service: Gynecology;  Laterality: N/A;   ENDOMETRIAL BIOPSY  04/20/2020       EYE SURGERY Bilateral    cryoretinopexy   hang nail procedure  Left 2024   great toe   Patient Active Problem List   Diagnosis Date Noted   Partial nontraumatic tear of right rotator cuff 12/22/2022   Left hamstring injury 07/15/2022   Labral tear of shoulder, degenerative, right 06/21/2022   Left carpal tunnel syndrome 05/26/2022   Foot sprain, left, initial encounter 05/19/2022   Baker's cyst of knee, left 04/21/2022   Right carpal tunnel syndrome 03/16/2022   Flushing 04/14/2021   Hypermobility syndrome 11/27/2020   Seasonal and perennial allergic rhinitis 11/06/2018   Mild intermittent asthma without complication 11/06/2018   Flexural atopic dermatitis 11/06/2018   History of hyperlipidemia 11/08/2016   History of anxiety 11/08/2016   History of retinal tear 11/08/2016   History of Bilateral plantar fasciitis 08/10/2016   High risk medication use 07/04/2016   Lupus (HCC) 05/23/2016   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HCC) 03/18/2016   ADHD (attention deficit hyperactivity disorder) 12/22/2014   Raynaud's phenomenon 10/17/2013   Hyperlipidemia 09/28/2011    PCP: Caesar Bookman, NP   REFERRING PROVIDER: Tarry Kos, MD  REFERRING DIAG:  Diagnosis  M25.552 (ICD-10-CM) - Pain in left hip    THERAPY DIAG:  Pain in left hip  Pain in left leg  Difficulty in walking, not elsewhere classified  Muscle weakness (generalized)  Rationale for Evaluation and Treatment: Rehabilitation  ONSET DATE:   SUBJECTIVE:   SUBJECTIVE STATEMENT: Pt stating good response from last visit, a little soreness but not bad  PERTINENT HISTORY: Pt s/p intra-articular hip injection, ADD, allergies, asthma, depression, carpal tunnel syndrome, anxiety, lupus, raynaud disease, retinal tear  PAIN:  NPRS scale: 3 /10 upon arrival Pain location: left hip  Pain description: achy Aggravating factors: sitting prolonged, stairs, lying on left side, lateral movements Relieving factors: kneeling, heat, over the counter meds prior to injection, flexril  PRECAUTIONS:  None  WEIGHT BEARING RESTRICTIONS: No  FALLS:  Has patient fallen in last 6 months? Yes. 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   PLOF: Independent  PATIENT GOALS: "Build strength and stability in hip and left side and be able to increase amt of time walking and on my feet."   Next MD visit:   OBJECTIVE:   DIAGNOSTIC FINDINGS: X-rays pending form [facility]  PATIENT SURVEYS:  06/19/23: FOTO intake:   22%  COGNITION: Overall cognitive status: WFL    SENSATION: WFL  MUSCLE LENGTH: Hamstrings: Right 90 deg; Left 80 deg (opposite leg straight)  POSTURE:  rounded shoulders and forward head  PALPATION: TTP: lateral hip, left IT band, left SI joint, left piriformis  LOWER EXTREMITY ROM:   ROM Right 06/19/23 Left 06/19/23  Hip flexion 125 122  Hip extension    Hip abduction    Hip adduction    Hip internal rotation 42 30  Hip external rotation 65 40  Knee flexion    Knee extension    Ankle dorsiflexion    Ankle plantarflexion    Ankle inversion    Ankle eversion     (Blank rows = not tested)  LOWER EXTREMITY MMT:  MMT Right 06/19/23 Left 06/19/23  Hip flexion 5 3  Hip extension    Hip abduction 5 3-  Hip adduction 5 4-  Hip internal rotation    Hip external rotation    Knee flexion    Knee extension 5 4  Ankle dorsiflexion 5 4  Ankle plantarflexion    Ankle inversion    Ankle eversion     (Blank rows = not tested)  LOWER EXTREMITY SPECIAL TESTS:  Hip special tests: Luisa Hart (FABER) test: positive  left   FUNCTIONAL TESTS:  06/19/23: 5 time sit to stand: 19.3 second no UE support  GAIT: Distance walked: clinic distances Assistive device utilized: None Level of assistance: Complete Independence Comments: appears  normal  TODAY'S TREATMENT                                                                           DATE: 07/21/23 Pt seen for aquatic therapy today.  Treatment took place in water 3.5-4.75 ft in depth at the [facility] pool. Temp of water was 91.  Pt entered/exited the pool via stairs with hand rail.   Pt requires the buoyancy and hydrostatic pressure of water for support, and to offload joints by unweighting joint load by at least 50 % in navel deep water and by at least 75-80% in chest to neck deep water.  Viscosity of the water is needed for resistance of strengthening. Water current perturbations provides challenge to standing balance requiring increased core activation. Aquatic PT exercises Forward walking  3 round trips backward walking 3 round trips Sidestepping 3 round trips Lateral hops across width of pool 3 round trips Tandem walk 3 round trips Leg swings hip abd/add X 20 bilat with UE support with UE support Leg swings hip flexion/extension X 20 bilat March hops in deeper water with UE support X 10 bilat scissor hops in deeper water with UE support X 10 bilat Monster walks 3 round trips in pool Skater hops in deeper water with UE support X 15 bilat Hip abd to add hops in deeper water with  Hip circles CW and CCW X 15 each bilat Seated on pool chair and cycling, frog kicks hip abd/add 5 min total  07/14/23 Pt seen for aquatic therapy today.  Treatment took place in water 3.5-4.75 ft in depth at the [facility] pool. Temp of water was 91.  Pt entered/exited the pool via stairs with hand rail.   Pt requires the buoyancy and hydrostatic pressure of water for support, and to offload joints by unweighting joint load by at least 50 % in navel deep water and by at least 75-80% in chest to neck deep  water.  Viscosity of the water is needed for resistance of strengthening. Water current perturbations provides challenge to standing balance requiring increased core activation. Aquatic PT exercises Sidestepping length of pool 3 round trips with mini squat Forward walking  3 round trips backward walking 3 round trips Sidestepping 3 round trips Tandem walk 3 round trips Leg swings hip abd/add X 15 bilat with UE support with UE support Leg swings hip flexion/extension X 15 bilat March hops in deeper water with UE support X 10 bilat scissor hops in deeper water with UE support X 10 bilat Skater hops in deeper water with UE support X 10 bilat Hip abd to add hops in deeper water with  Step ups onto first step of pool X15 bilat with UE support Hip circles CW and CCW X 15 each bilat Seated on pool chair and cycling and hip abd/add 3 min  06/19/23 Therex: Supine bridge: x 10 holding 5 sec Supine bridge: walking feet in/out x 10 Piriformis stretch: x 2 bil LE holding 30 sec Glute stretch: x 2 bil LE holding 30 sec  Standing hip flexor stretch: x 4 holding 20-30 sec Recumbent bike level 3 following DN x 5 minutes Manual:  Trigger Point Dry-Needling  Treatment instructions: Expect mild to moderate muscle  soreness. S/S of pneumothorax if dry needled over a lung field, and to seek immediate medical attention should they occur. Patient verbalized understanding of these instructions and education.  Patient Consent Given: Yes Education handout provided: Previously provided Muscles treated: glute medius. Left piriformis, Left superior rectus intermedius Electrical stimulation performed: No Parameters: N/A Treatment response/outcome: twitch responses noted, pt with good response      DATE: 06/19/23 Therex:    HEP instruction/performance c cues for techniques, handout provided.  Trial set performed of each for comprehension and symptom assessment.  See below for exercise list  PATIENT  EDUCATION:  Education details: HEP, POC Person educated: Patient Education method: Explanation, Demonstration, Verbal cues, and Handouts Education comprehension: verbalized understanding, returned demonstration, and verbal cues required  HOME EXERCISE PROGRAM: Access Code: [email or web address] URL: [email or web address] Date: 06/19/2023 Prepared by: Narda Amber  Exercises - Supine Bridge  - 1-2 x daily - 7 x weekly - 3 sets - 10 reps - 5 seconds hold - Supine ITB Stretch with Strap  - 1-2 x daily - 7 x weekly - 3-5 reps - 20-30 seconds hold - Sidelying Reverse Clamshell  - 1-2 x daily - 7 x weekly - 2 sets - 10 reps - Clam with Resistance  - 1-2 x daily - 7 x weekly - 2 sets - 10 reps - Supine Piriformis Stretch with Foot on Ground  - 1-2 x daily - 7 x weekly - 3-5 reps - 20-30 seconds hold - Supine Figure 4 Piriformis Stretch  - 1-2 x daily - 7 x weekly - 10 reps - 20-30 seconds hold   Access Code: [facility] URL: [email or web address] Date: 07/14/2023 Prepared by: Ivery Quale  Exercises - Standing Hip Flexion Extension at [facility]  - 1 x daily - 2 x weekly - 15 reps - Standing Hip Abduction Adduction at Pool Wall  - 1 x daily - 2 x weekly - 20 reps - Forward Walking  - 1 x daily - 2 x weekly - 4 sets - Backward Walking  - 1 x daily - 2 x weekly - 4 sets - Side Stepping  - 2 x daily - 2 x weekly - 4 sets - 10 reps - Forward March  - 1 x daily - 2 x weekly - 4 sets - Backward March  - 1 x daily - 2 x weekly - 4 sets - Lunge to [employer] at [employer]  - 1 x daily - 2 x weekly - 1 sets - 20 reps - Alternating Arms Jumping in Place  - 1 x daily - 2 x weekly - 2 sets - 10 reps - lateral hops in water depth you prefer  - 1 x daily - 2 x weekly - 1 sets - 10 reps - Forward Monster Walk  - 1 x daily - 2 x weekly - 4 sets - Standing Single Leg Hip Circles  - 1 x daily - 2 x weekly - 1 sets - 20 reps  ASSESSMENT:  CLINICAL IMPRESSION: She requested to keep about the  same intensity in the pool as was a little sore but not bad. She had good in tolerance to program today. Recommend continued skilled PT interventions to maximize pt's function.   OBJECTIVE IMPAIRMENTS: decreased balance, decreased mobility, difficulty walking, decreased ROM, decreased strength, and pain.   ACTIVITY LIMITATIONS: lifting, bending, sitting, standing, squatting, sleeping, stairs, and transfers  PARTICIPATION LIMITATIONS: cleaning, laundry, driving, shopping, community activity, occupation, and yard work  PERSONAL FACTORS:  see pertinent  history above  are also affecting patient's functional outcome.   REHAB POTENTIAL: Good  CLINICAL DECISION MAKING: Stable/uncomplicated  EVALUATION COMPLEXITY: Low   GOALS: Goals reviewed with patient? Yes  SHORT TERM GOALS: (target date for Short term goals are 3 weeks 07/14/23)   1.  Patient will demonstrate independent use of home exercise program to maintain progress from in clinic treatments.  Goal status: On-going 07/10/23  LONG TERM GOALS: (target dates for all long term goals are 10 weeks  09/01/23 )   1. Patient will demonstrate/report pain at worst less than or equal to 2/10 to facilitate minimal limitation in daily activity secondary to pain symptoms.  Goal status: New   2. Patient will demonstrate independent use of home exercise program to facilitate ability to maintain/progress functional gains from skilled physical therapy services.  Goal status: New   3. Patient will demonstrate FOTO outcome > or = 51 % to indicate reduced disability due to condition.  Goal status: New   4.  Patient will demonstrate left  LE MMT 5/5 throughout to faciltiate usual transfers, stairs, squatting at [facility] for daily life.   Goal status: New   5.  Patient will demonstrate ability to navigate up and down 5 steps with single hand rail with reciprocal gait pattern.  Goal status: New   6.  Pt will improve her 5 time sit to stand to </= 10  sec with no UE support in order to improve functional mobility.  Goal status: New      PLAN:  PT FREQUENCY: 1-2x/week  PT DURATION: 10 weeks  PLANNED INTERVENTIONS: Therapeutic exercises, Therapeutic activity, Neuro Muscular re-education, Balance training, Gait training, Patient/Family education, Joint mobilization, Stair training, DME instructions, Dry Needling, Electrical stimulation, Traction, Cryotherapy, vasopneumatic deviceMoist heat, Taping, Ultrasound, Ionotophoresis 4mg /ml Dexamethasone, and aquatic therapy, Manual therapy.  All included unless contraindicated  PLAN FOR NEXT SESSION:  LE gentle strengthening to pt's tolerance, stretching, modalities as needed      April Manson, PT, DPT 07/21/2023, 10:15 AM

## 2023-07-24 ENCOUNTER — Encounter: Payer: Self-pay | Admitting: Physical Therapy

## 2023-07-24 ENCOUNTER — Ambulatory Visit (INDEPENDENT_AMBULATORY_CARE_PROVIDER_SITE_OTHER): Payer: BC Managed Care – PPO | Admitting: Physical Therapy

## 2023-07-24 DIAGNOSIS — R262 Difficulty in walking, not elsewhere classified: Secondary | ICD-10-CM

## 2023-07-24 DIAGNOSIS — M6281 Muscle weakness (generalized): Secondary | ICD-10-CM | POA: Diagnosis not present

## 2023-07-24 DIAGNOSIS — M79605 Pain in left leg: Secondary | ICD-10-CM

## 2023-07-24 DIAGNOSIS — M25552 Pain in left hip: Secondary | ICD-10-CM | POA: Diagnosis not present

## 2023-07-24 NOTE — Therapy (Signed)
OUTPATIENT PHYSICAL THERAPY TREATMENT   Patient Name: Robin [name] MRN: [id number] DOB:04-01-1971, 52 y.o., female Today's Date: 07/24/2023  END OF SESSION:  PT End of Session - 07/24/23 0939     Visit Number 5    Number of Visits 20    Date for PT Re-Evaluation 09/01/23    PT Start Time 0930    PT Stop Time 1010    PT Time Calculation (min) 40 min    Activity Tolerance Patient tolerated treatment well    Behavior During Therapy WFL for tasks assessed/performed               Past Medical History:  Diagnosis Date   ADD (attention deficit disorder)    Allergy    Anemia    Anorexia nervosa with bulimia    Per [facility] New Patient Packet   Anxiety    Asthma    Carpal tunnel syndrome    Per [facility] New Patient Packet   Depression    Dry eyes    Per [facility] New Patient Packet   Dry mouth    Per [facility] New Patient Packet   Fibroids    Per [facility] New Patient Packet   History of hip x-ray 2021   Per [facility] New Patient Packet   History of Papanicolaou smear of cervix 2021   Per [facility] New Patient Packet, Dr.Pickens   Hyperlipidemia    Ileitis    Lupus    PONV (postoperative nausea and vomiting)    mild nausea    Raynaud disease    Retinal tear    Per [facility] New Patient Packet   Systemic lupus erythematosus (HCC)    Past Surgical History:  Procedure Laterality Date   ABLATION  2022   CARPAL TUNNEL RELEASE Right    08/2019   CESAREAN SECTION  2003   CESAREAN SECTION  2005   Per [facility] New Patient Packet   COLONOSCOPY  2017   Dr.Jacobs   cyst drained Left 2023   knee   DENTAL SURGERY  2009   Fromed tooth and chronic infection   DILITATION & CURRETTAGE/HYSTROSCOPY WITH NOVASURE ABLATION N/A 06/03/2020   Procedure: DILATATION & CURETTAGE/HYSTEROSCOPY WITH NOVASURE ABLATION;  Surgeon: Mesilla Bing, MD;  Location: [facility];  Service: Gynecology;  Laterality: N/A;   ENDOMETRIAL BIOPSY  04/20/2020       EYE SURGERY Bilateral    cryoretinopexy   hang nail  procedure Left 2024   great toe   Patient Active Problem List   Diagnosis Date Noted   Partial nontraumatic tear of right rotator cuff 12/22/2022   Left hamstring injury 07/15/2022   Labral tear of shoulder, degenerative, right 06/21/2022   Left carpal tunnel syndrome 05/26/2022   Foot sprain, left, initial encounter 05/19/2022   Baker's cyst of knee, left 04/21/2022   Right carpal tunnel syndrome 03/16/2022   Flushing 04/14/2021   Hypermobility syndrome 11/27/2020   Seasonal and perennial allergic rhinitis 11/06/2018   Mild intermittent asthma without complication 11/06/2018   Flexural atopic dermatitis 11/06/2018   History of hyperlipidemia 11/08/2016   History of anxiety 11/08/2016   History of retinal tear 11/08/2016   History of Bilateral plantar fasciitis 08/10/2016   High risk medication use 07/04/2016   Lupus (HCC) 05/23/2016   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HCC) 03/18/2016   ADHD (attention deficit hyperactivity disorder) 12/22/2014   Raynaud's phenomenon 10/17/2013   Hyperlipidemia 09/28/2011    PCP: Caesar Bookman, NP   REFERRING PROVIDER: Tarry Kos, MD  REFERRING DIAG:  Diagnosis  M25.552 (ICD-10-CM) - Pain in left hip    THERAPY DIAG:  Pain in left hip  Pain in left leg  Difficulty in walking, not elsewhere classified  Muscle weakness (generalized)  Rationale for Evaluation and Treatment: Rehabilitation  ONSET DATE:   SUBJECTIVE:   SUBJECTIVE STATEMENT: Pt stating the DN helped her feel more loose on the left and feels now her right side is more tight. Pt wishing to try DN on the right side this visit. Pt reporting aquatic therapy is going well.   PERTINENT HISTORY: Pt s/p intra-articular hip injection, ADD, allergies, asthma, depression, carpal tunnel syndrome, anxiety, lupus, raynaud disease, retinal tear  PAIN:  NPRS scale: 5-6/10 upon arrival Pain location: left hip  Pain description: achy Aggravating factors: sitting prolonged, stairs,  lying on left side, lateral movements Relieving factors: kneeling, heat, over the counter meds prior to injection, flexril  PRECAUTIONS: None  WEIGHT BEARING RESTRICTIONS: No  FALLS:  Has patient fallen in last 6 months? Yes. 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   PLOF: Independent  PATIENT GOALS: "Build strength and stability in hip and left side and be able to increase amt of time walking and on my feet."   Next MD visit:   OBJECTIVE:   DIAGNOSTIC FINDINGS: X-rays pending form [facility]  PATIENT SURVEYS:  06/19/23: FOTO intake:   22%  COGNITION: Overall cognitive status: WFL    SENSATION: WFL  MUSCLE LENGTH: Hamstrings: Right 90 deg; Left 80 deg (opposite leg straight)  POSTURE:  rounded shoulders and forward head  PALPATION: TTP: lateral hip, left IT band, left SI joint, left piriformis  LOWER EXTREMITY ROM:   ROM Right 06/19/23 Left 06/19/23  Hip flexion 125 122  Hip extension    Hip abduction    Hip adduction    Hip internal rotation 42 30  Hip external rotation 65 40  Knee flexion    Knee extension    Ankle dorsiflexion    Ankle plantarflexion    Ankle inversion    Ankle eversion     (Blank rows = not tested)  LOWER EXTREMITY MMT:  MMT Right 06/19/23 Left 06/19/23  Hip flexion 5 3  Hip extension    Hip abduction 5 3-  Hip adduction 5 4-  Hip internal rotation    Hip external rotation    Knee flexion    Knee extension 5 4  Ankle dorsiflexion 5 4  Ankle plantarflexion    Ankle inversion    Ankle eversion     (Blank rows = not tested)  LOWER EXTREMITY SPECIAL TESTS:  Hip special tests: Luisa Hart (FABER) test: positive  left   FUNCTIONAL TESTS:  06/19/23: 5 time sit to stand: 19.3 second no UE  support  GAIT: Distance walked: clinic distances Assistive device utilized: None Level of assistance: Complete Independence Comments: appears normal  TODAY'S TREATMENT                                                                           DATE: 07/24/23 Therex: Recumbent bike: x 7 minutes Level 4 Supine bridge: x 10 holding 5 sec Supine: hips/knees at 90 degrees, reverse clams 2 x 10  Side lying: circles bil directions x 10  Standing hip flexor stretch: x 4 holding 20-30 sec Manual:  Skilled palpation during TPDN of active trigger points Trigger Point Dry-Needling  Treatment instructions: Expect mild to moderate muscle soreness. S/S of pneumothorax if dry needled over a lung field, and to seek immediate medical attention should they occur. Patient verbalized understanding of these instructions and education. Patient Consent Given: Yes Education handout provided: Previously provided Muscles treated: Rt glute medius, Rt piriformis, Rt and left lumbar multifidi Electrical stimulation performed: No Parameters: N/A Treatment response/outcome: twitch responses noted, pt with good response     07/21/23 Pt seen for aquatic therapy today.  Treatment took place in water 3.5-4.75 ft in depth at the [facility] pool. Temp of water was 91.  Pt entered/exited the pool via stairs with hand rail.   Pt requires the buoyancy and hydrostatic pressure of water for support, and to offload joints by unweighting joint load by at least 50 % in navel deep water and by at least 75-80% in chest to neck deep water.  Viscosity of the water is needed for resistance of strengthening. Water current perturbations provides challenge to standing balance requiring increased core activation. Aquatic PT exercises Forward walking  3 round  trips backward walking 3 round trips Sidestepping 3 round trips Lateral hops across width of pool 3 round trips Tandem walk 3 round trips Leg swings hip abd/add X 20 bilat with UE support with UE support Leg swings hip flexion/extension X 20 bilat March hops in deeper water with UE support X 10 bilat scissor hops in deeper water with UE support X 10 bilat Monster walks 3 round trips in pool Skater hops in deeper water with UE support X 15 bilat Hip abd to add hops in deeper water with  Hip circles CW and CCW X 15 each bilat Seated on pool chair and cycling, frog kicks hip abd/add 5 min total  07/14/23 Pt seen for aquatic therapy today.  Treatment took place in water 3.5-4.75 ft in depth at the [facility] pool. Temp of water was 91.  Pt entered/exited the pool via stairs with hand rail.   Pt requires the buoyancy and hydrostatic pressure of water for support, and to offload joints by unweighting joint load by at least 50 % in navel deep water and by at least 75-80% in chest to neck deep water.  Viscosity of the water is needed for resistance of strengthening. Water current perturbations provides challenge to standing balance requiring increased core activation. Aquatic PT exercises Sidestepping length of pool 3 round trips with mini squat Forward walking  3 round trips backward walking 3 round trips Sidestepping 3 round trips Tandem walk 3 round trips Leg swings hip abd/add X 15 bilat with UE support with UE support Leg swings hip flexion/extension X 15 bilat March hops in deeper water with UE support X  10 bilat scissor hops in deeper water with UE support X 10 bilat Skater hops in deeper water with UE support X 10 bilat Hip abd to add hops in deeper water with  Step ups onto first step of pool X15 bilat with UE support Hip circles CW and CCW X 15 each bilat Seated on pool chair and cycling and hip abd/add 3 min     PATIENT EDUCATION:  Education details: HEP,  POC Person educated: Patient Education method: Explanation, Demonstration, Verbal cues, and Handouts Education comprehension: verbalized understanding, returned demonstration, and verbal cues required  HOME EXERCISE PROGRAM: Access Code: [email or web address] URL: [email or web address] Date: 06/19/2023 Prepared by: Narda Amber  Exercises - Supine Bridge  - 1-2 x daily - 7 x weekly - 3 sets - 10 reps - 5 seconds hold - Supine ITB Stretch with Strap  - 1-2 x daily - 7 x weekly - 3-5 reps - 20-30 seconds hold - Sidelying Reverse Clamshell  - 1-2 x daily - 7 x weekly - 2 sets - 10 reps - Clam with Resistance  - 1-2 x daily - 7 x weekly - 2 sets - 10 reps - Supine Piriformis Stretch with Foot on Ground  - 1-2 x daily - 7 x weekly - 3-5 reps - 20-30 seconds hold - Supine Figure 4 Piriformis Stretch  - 1-2 x daily - 7 x weekly - 10 reps - 20-30 seconds hold   Access Code: [facility] URL: [email or web address] Date: 07/14/2023 Prepared by: Ivery Quale  Exercises - Standing Hip Flexion Extension at [facility]  - 1 x daily - 2 x weekly - 15 reps - Standing Hip Abduction Adduction at Pool Wall  - 1 x daily - 2 x weekly - 20 reps - Forward Walking  - 1 x daily - 2 x weekly - 4 sets - Backward Walking  - 1 x daily - 2 x weekly - 4 sets - Side Stepping  - 2 x daily - 2 x weekly - 4 sets - 10 reps - Forward March  - 1 x daily - 2 x weekly - 4 sets - Backward March  - 1 x daily - 2 x weekly - 4 sets - Lunge to [employer] at [employer]  - 1 x daily - 2 x weekly - 1 sets - 20 reps - Alternating Arms Jumping in Place  - 1 x daily - 2 x weekly - 2 sets - 10 reps - lateral hops in water depth you prefer  - 1 x daily - 2 x weekly - 1 sets - 10 reps - Forward Monster Walk  - 1 x daily - 2 x weekly - 4 sets - Standing Single Leg Hip Circles  - 1 x daily - 2 x weekly - 1 sets - 20 reps  ASSESSMENT:  CLINICAL IMPRESSION: Pt tolerating exercises and TPDN again to pt's lumbar spine and Rt  glutes/piriformis. Pt reporting less tightness at end of session with decrease in pain. Continue skilled PT interventions to maximize pt's function.   OBJECTIVE IMPAIRMENTS: decreased balance, decreased mobility, difficulty walking, decreased ROM, decreased strength, and pain.   ACTIVITY LIMITATIONS: lifting, bending, sitting, standing, squatting, sleeping, stairs, and transfers  PARTICIPATION LIMITATIONS: cleaning, laundry, driving, shopping, community activity, occupation, and yard work  PERSONAL FACTORS:  see pertinent history above  are also affecting patient's functional outcome.   REHAB POTENTIAL: Good  CLINICAL DECISION MAKING: Stable/uncomplicated  EVALUATION COMPLEXITY: Low   GOALS: Goals reviewed with patient? Yes  SHORT  TERM GOALS: (target date for Short term goals are 3 weeks 07/14/23)   1.  Patient will demonstrate independent use of home exercise program to maintain progress from in clinic treatments.  Goal status: On-going 07/10/23  LONG TERM GOALS: (target dates for all long term goals are 10 weeks  09/01/23 )   1. Patient will demonstrate/report pain at worst less than or equal to 2/10 to facilitate minimal limitation in daily activity secondary to pain symptoms.  Goal status: New   2. Patient will demonstrate independent use of home exercise program to facilitate ability to maintain/progress functional gains from skilled physical therapy services.  Goal status: New   3. Patient will demonstrate FOTO outcome > or = 51 % to indicate reduced disability due to condition.  Goal status: New   4.  Patient will demonstrate left  LE MMT 5/5 throughout to faciltiate usual transfers, stairs, squatting at [facility] for daily life.   Goal status: New   5.  Patient will demonstrate ability to navigate up and down 5 steps with single hand rail with reciprocal gait pattern.  Goal status: New   6.  Pt will improve her 5 time sit to stand to </= 10 sec with no UE support in  order to improve functional mobility.  Goal status: New      PLAN:  PT FREQUENCY: 1-2x/week  PT DURATION: 10 weeks  PLANNED INTERVENTIONS: Therapeutic exercises, Therapeutic activity, Neuro Muscular re-education, Balance training, Gait training, Patient/Family education, Joint mobilization, Stair training, DME instructions, Dry Needling, Electrical stimulation, Traction, Cryotherapy, vasopneumatic deviceMoist heat, Taping, Ultrasound, Ionotophoresis 4mg /ml Dexamethasone, and aquatic therapy, Manual therapy.  All included unless contraindicated  PLAN FOR NEXT SESSION:  LE gentle strengthening to pt's tolerance, stretching, modalities as needed      Sharmon Leyden, PT, MPT 07/24/2023, 9:46 AM

## 2023-07-28 ENCOUNTER — Encounter: Payer: Self-pay | Admitting: Physical Therapy

## 2023-07-28 ENCOUNTER — Ambulatory Visit (INDEPENDENT_AMBULATORY_CARE_PROVIDER_SITE_OTHER): Payer: BC Managed Care – PPO | Admitting: Physical Therapy

## 2023-07-28 ENCOUNTER — Ambulatory Visit: Payer: BC Managed Care – PPO | Admitting: Physical Therapy

## 2023-07-28 DIAGNOSIS — R262 Difficulty in walking, not elsewhere classified: Secondary | ICD-10-CM

## 2023-07-28 DIAGNOSIS — M25552 Pain in left hip: Secondary | ICD-10-CM | POA: Diagnosis not present

## 2023-07-28 DIAGNOSIS — M79605 Pain in left leg: Secondary | ICD-10-CM

## 2023-07-28 DIAGNOSIS — M6281 Muscle weakness (generalized): Secondary | ICD-10-CM

## 2023-07-28 NOTE — Therapy (Signed)
OUTPATIENT PHYSICAL THERAPY TREATMENT   Patient Name: Robin [name] MRN: [id number] DOB:1971-01-31, 52 y.o., female Today's Date: 07/28/2023  END OF SESSION:  PT End of Session - 07/28/23 0845     Visit Number 6    Number of Visits 20    Date for PT Re-Evaluation 09/01/23    PT Start Time 0843    PT Stop Time 0921    PT Time Calculation (min) 38 min    Activity Tolerance Patient tolerated treatment well    Behavior During Therapy [facility] for tasks assessed/performed               Past Medical History:  Diagnosis Date   ADD (attention deficit disorder)    Allergy    Anemia    Anorexia nervosa with bulimia    Per [facility] New Patient Packet   Anxiety    Asthma    Carpal tunnel syndrome    Per [facility] New Patient Packet   Depression    Dry eyes    Per [facility] New Patient Packet   Dry mouth    Per [facility] New Patient Packet   Fibroids    Per [facility] New Patient Packet   History of hip x-ray 2021   Per [facility] New Patient Packet   History of Papanicolaou smear of cervix 2021   Per [facility] New Patient Packet, Dr.Pickens   Hyperlipidemia    Ileitis    Lupus    PONV (postoperative nausea and vomiting)    mild nausea    Raynaud disease    Retinal tear    Per [facility] New Patient Packet   Systemic lupus erythematosus (HCC)    Past Surgical History:  Procedure Laterality Date   ABLATION  2022   CARPAL TUNNEL RELEASE Right    08/2019   CESAREAN SECTION  2003   CESAREAN SECTION  2005   Per [facility] New Patient Packet   COLONOSCOPY  2017   Dr.Jacobs   cyst drained Left 2023   knee   DENTAL SURGERY  2009   Fromed tooth and chronic infection   DILITATION & CURRETTAGE/HYSTROSCOPY WITH NOVASURE ABLATION N/A 06/03/2020   Procedure: DILATATION & CURETTAGE/HYSTEROSCOPY WITH NOVASURE ABLATION;  Surgeon: Atkinson Bing, MD;  Location: [facility];  Service: Gynecology;  Laterality: N/A;   ENDOMETRIAL BIOPSY  04/20/2020       EYE SURGERY Bilateral    cryoretinopexy   hang nail  procedure Left 2024   great toe   Patient Active Problem List   Diagnosis Date Noted   Partial nontraumatic tear of right rotator cuff 12/22/2022   Left hamstring injury 07/15/2022   Labral tear of shoulder, degenerative, right 06/21/2022   Left carpal tunnel syndrome 05/26/2022   Foot sprain, left, initial encounter 05/19/2022   Baker's cyst of knee, left 04/21/2022   Right carpal tunnel syndrome 03/16/2022   Flushing 04/14/2021   Hypermobility syndrome 11/27/2020   Seasonal and perennial allergic rhinitis 11/06/2018   Mild intermittent asthma without complication 11/06/2018   Flexural atopic dermatitis 11/06/2018   History of hyperlipidemia 11/08/2016   History of anxiety 11/08/2016   History of retinal tear 11/08/2016   History of Bilateral plantar fasciitis 08/10/2016   High risk medication use 07/04/2016   Lupus (HCC) 05/23/2016   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HCC) 03/18/2016   ADHD (attention deficit hyperactivity disorder) 12/22/2014   Raynaud's phenomenon 10/17/2013   Hyperlipidemia 09/28/2011    PCP: Caesar Bookman, NP   REFERRING PROVIDER: Tarry Kos, MD  REFERRING DIAG:  Diagnosis  M25.552 (ICD-10-CM) - Pain in left hip    THERAPY DIAG:  Pain in left hip  Pain in left leg  Difficulty in walking, not elsewhere classified  Muscle weakness (generalized)  Rationale for Evaluation and Treatment: Rehabilitation  ONSET DATE:   SUBJECTIVE:   SUBJECTIVE STATEMENT: Pt states overall about 5-6/10 pain upon arrival, feels intermittent instability in her hips   PERTINENT HISTORY: Pt s/p intra-articular hip injection, ADD, allergies, asthma, depression, carpal tunnel syndrome, anxiety, lupus, raynaud disease, retinal tear  PAIN:  NPRS scale: 5-6/10 upon arrival Pain location: left hip  Pain description: achy Aggravating factors: sitting prolonged, stairs, lying on left side, lateral movements Relieving factors: kneeling, heat, over the counter meds prior to  injection, flexril  PRECAUTIONS: None  WEIGHT BEARING RESTRICTIONS: No  FALLS:  Has patient fallen in last 6 months? Yes. 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   PLOF: Independent  PATIENT GOALS: "Build strength and stability in hip and left side and be able to increase amt of time walking and on my feet."   Next MD visit:   OBJECTIVE:   DIAGNOSTIC FINDINGS: X-rays pending form [facility]  PATIENT SURVEYS:  06/19/23: FOTO intake:   22%  COGNITION: Overall cognitive status: WFL    SENSATION: WFL  MUSCLE LENGTH: Hamstrings: Right 90 deg; Left 80 deg (opposite leg straight)  POSTURE:  rounded shoulders and forward head  PALPATION: TTP: lateral hip, left IT band, left SI joint, left piriformis  LOWER EXTREMITY ROM:   ROM Right 06/19/23 Left 06/19/23  Hip flexion 125 122  Hip extension    Hip abduction    Hip adduction    Hip internal rotation 42 30  Hip external rotation 65 40  Knee flexion    Knee extension    Ankle dorsiflexion    Ankle plantarflexion    Ankle inversion    Ankle eversion     (Blank rows = not tested)  LOWER EXTREMITY MMT:  MMT Right 06/19/23 Left 06/19/23  Hip flexion 5 3  Hip extension    Hip abduction 5 3-  Hip adduction 5 4-  Hip internal rotation    Hip external rotation    Knee flexion    Knee extension 5 4  Ankle dorsiflexion 5 4  Ankle plantarflexion    Ankle inversion    Ankle eversion     (Blank rows = not tested)  LOWER EXTREMITY SPECIAL TESTS:  Hip special tests: Luisa Hart (FABER) test: positive  left   FUNCTIONAL TESTS:  06/19/23: 5 time sit to stand: 19.3 second no UE support  GAIT: Distance walked: clinic distances Assistive device utilized: None Level of assistance: Complete  Independence Comments: appears normal  TODAY'S TREATMENT                                                                           DATE:  07/28/23 Pt seen for aquatic therapy today.  Treatment took place in water 3.5-4.75 ft in depth at the [facility] pool. Temp of water was 91.  Pt entered/exited the pool via stairs with hand rail.   Pt requires the buoyancy and hydrostatic pressure of water for support, and to offload joints by unweighting joint load by at least 50 % in navel deep water and by at least 75-80% in chest to neck deep water.  Viscosity of the water is needed for resistance of strengthening. Water current perturbations provides challenge to standing balance requiring increased core activation. Aquatic PT exercises Forward walking  4 round trips backward walking 4 round trips Sidestepping 4 round trips Lateral hops across width of pool 3 round trips Tandem walk 3 round trips Leg swings hip abd/add X 20 bilat with UE support with UE support Leg swings hip flexion/extension X 20 bilat March hops in deeper water with UE support X 10 bilat scissor hops in deeper water with UE support X 10 bilat Monster walk hops 3 round trips in pool Skater hops in deeper water with UE support X 15 bilat Hip abd to add hops in deeper water with  Hip circles CW and CCW X 15 each bilat Seated on pool chair and cycling, frog kicks hip abd/add 5 min total  07/24/23 Therex: Recumbent bike: x 7 minutes Level 4 Supine bridge: x 10 holding 5 sec Supine: hips/knees at 90 degrees, reverse clams 2 x 10  Side lying: circles bil directions x 10  Standing hip flexor stretch: x 4 holding 20-30 sec Manual:  Skilled palpation during TPDN of active trigger points Trigger Point Dry-Needling  Treatment instructions: Expect mild to  moderate muscle soreness. S/S of pneumothorax if dry needled over a lung field, and to seek immediate medical attention should they occur. Patient verbalized understanding of these instructions and education. Patient Consent Given: Yes Education handout provided: Previously provided Muscles treated: Rt glute medius, Rt piriformis, Rt and left lumbar multifidi Electrical stimulation performed: No Parameters: N/A Treatment response/outcome: twitch responses noted, pt with good response    PATIENT EDUCATION:  Education details: HEP, POC Person educated: Patient Education method: Programmer, multimedia, Facilities manager, Verbal cues, and Handouts Education comprehension: verbalized understanding, returned demonstration, and verbal cues required  HOME EXERCISE PROGRAM: Access Code: [email or web address] URL: [email or web address] Date: 06/19/2023 Prepared by: Narda Amber  Exercises - Supine Bridge  - 1-2 x daily - 7 x weekly - 3 sets - 10 reps - 5 seconds hold - Supine ITB Stretch with Strap  - 1-2 x daily - 7 x weekly - 3-5 reps - 20-30 seconds hold - Sidelying Reverse Clamshell  - 1-2 x daily - 7 x weekly - 2 sets - 10 reps - Clam with Resistance  - 1-2 x daily - 7 x weekly - 2 sets - 10 reps - Supine Piriformis Stretch with Foot on Ground  - 1-2 x daily - 7 x weekly - 3-5 reps - 20-30 seconds hold - Supine Figure 4 Piriformis Stretch  - 1-2 x daily -  7 x weekly - 10 reps - 20-30 seconds hold   Access Code: [facility] URL: [email or web address] Date: 07/14/2023 Prepared by: Ivery Quale  Exercises - Standing Hip Flexion Extension at [employer]  - 1 x daily - 2 x weekly - 15 reps - Standing Hip Abduction Adduction at Pool Wall  - 1 x daily - 2 x weekly - 20 reps - Forward Walking  - 1 x daily - 2 x weekly - 4 sets - Backward Walking  - 1 x daily - 2 x weekly - 4 sets - Side Stepping  - 2 x daily - 2 x weekly - 4 sets - 10 reps - Forward March  - 1 x daily - 2 x weekly - 4  sets - Backward March  - 1 x daily - 2 x weekly - 4 sets - Lunge to [employer] at [employer]  - 1 x daily - 2 x weekly - 1 sets - 20 reps - Alternating Arms Jumping in Place  - 1 x daily - 2 x weekly - 2 sets - 10 reps - lateral hops in water depth you prefer  - 1 x daily - 2 x weekly - 1 sets - 10 reps - Forward Monster Walk  - 1 x daily - 2 x weekly - 4 sets - Standing Single Leg Hip Circles  - 1 x daily - 2 x weekly - 1 sets - 20 reps  ASSESSMENT:  CLINICAL IMPRESSION: She had good tolerance to aquatic PT program and has been doing her HEP for this as well at [facility] pool. She will miss about a month of PT due to traveling and we will continue with PT program when she gets back.    OBJECTIVE IMPAIRMENTS: decreased balance, decreased mobility, difficulty walking, decreased ROM, decreased strength, and pain.   ACTIVITY LIMITATIONS: lifting, bending, sitting, standing, squatting, sleeping, stairs, and transfers  PARTICIPATION LIMITATIONS: cleaning, laundry, driving, shopping, community activity, occupation, and yard work  PERSONAL FACTORS:  see pertinent history above  are also affecting patient's functional outcome.   REHAB POTENTIAL: Good  CLINICAL DECISION MAKING: Stable/uncomplicated  EVALUATION COMPLEXITY: Low   GOALS: Goals reviewed with patient? Yes  SHORT TERM GOALS: (target date for Short term goals are 3 weeks 07/14/23)   1.  Patient will demonstrate independent use of home exercise program to maintain progress from in clinic treatments.  Goal status: On-going 07/10/23  LONG TERM GOALS: (target dates for all long term goals are 10 weeks  09/01/23 )   1. Patient will demonstrate/report pain at worst less than or equal to 2/10 to facilitate minimal limitation in daily activity secondary to pain symptoms.  Goal status: New   2. Patient will demonstrate independent use of home exercise program to facilitate ability to maintain/progress functional gains from skilled physical  therapy services.  Goal status: New   3. Patient will demonstrate FOTO outcome > or = 51 % to indicate reduced disability due to condition.  Goal status: New   4.  Patient will demonstrate left  LE MMT 5/5 throughout to faciltiate usual transfers, stairs, squatting at [facility] for daily life.   Goal status: New   5.  Patient will demonstrate ability to navigate up and down 5 steps with single hand rail with reciprocal gait pattern.  Goal status: New   6.  Pt will improve her 5 time sit to stand to </= 10 sec with no UE support in order to improve functional mobility.  Goal status:  New      PLAN:  PT FREQUENCY: 1-2x/week  PT DURATION: 10 weeks  PLANNED INTERVENTIONS: Therapeutic exercises, Therapeutic activity, Neuro Muscular re-education, Balance training, Gait training, Patient/Family education, Joint mobilization, Stair training, DME instructions, Dry Needling, Electrical stimulation, Traction, Cryotherapy, vasopneumatic deviceMoist heat, Taping, Ultrasound, Ionotophoresis 4mg /ml Dexamethasone, and aquatic therapy, Manual therapy.  All included unless contraindicated  PLAN FOR NEXT SESSION:  LE gentle strengthening to pt's tolerance, stretching, modalities as needed   April Manson, PT, DPT 07/28/2023, 8:51 AM

## 2023-07-31 ENCOUNTER — Encounter: Payer: BC Managed Care – PPO | Admitting: Physical Therapy

## 2023-08-25 ENCOUNTER — Ambulatory Visit: Payer: BC Managed Care – PPO | Admitting: Physical Therapy

## 2023-09-01 ENCOUNTER — Other Ambulatory Visit: Payer: Self-pay | Admitting: Allergy & Immunology

## 2023-09-08 ENCOUNTER — Ambulatory Visit: Payer: BC Managed Care – PPO | Admitting: Physical Therapy

## 2023-09-12 DIAGNOSIS — F411 Generalized anxiety disorder: Secondary | ICD-10-CM | POA: Diagnosis not present

## 2023-09-12 DIAGNOSIS — F902 Attention-deficit hyperactivity disorder, combined type: Secondary | ICD-10-CM | POA: Diagnosis not present

## 2023-09-12 DIAGNOSIS — F339 Major depressive disorder, recurrent, unspecified: Secondary | ICD-10-CM | POA: Diagnosis not present

## 2023-09-20 DIAGNOSIS — F331 Major depressive disorder, recurrent, moderate: Secondary | ICD-10-CM | POA: Diagnosis not present

## 2023-10-06 DIAGNOSIS — M79672 Pain in left foot: Secondary | ICD-10-CM | POA: Diagnosis not present

## 2023-10-06 DIAGNOSIS — M2022 Hallux rigidus, left foot: Secondary | ICD-10-CM | POA: Diagnosis not present

## 2023-10-06 DIAGNOSIS — M79671 Pain in right foot: Secondary | ICD-10-CM | POA: Diagnosis not present

## 2023-10-09 ENCOUNTER — Telehealth: Payer: Self-pay | Admitting: *Deleted

## 2023-10-09 ENCOUNTER — Encounter: Payer: Self-pay | Admitting: *Deleted

## 2023-10-09 NOTE — Telephone Encounter (Signed)
Received a Surgery Clearance Form from [facility].  Surgical Procedure: Left 1st MTP Dorsal Cheilectomy  Tried to call patient to schedule an appointment for Surgery Clearance. LMOM to return call.   Once Form is Filled out and sign it is to be Faxed back to Jerilynn [facility]ages Fax: [phone]  (Surgery Clearance Form is on Clinical Intake desk, awaiting callback from patient to schedule an appointment)

## 2023-10-10 ENCOUNTER — Other Ambulatory Visit: Payer: Self-pay | Admitting: Allergy & Immunology

## 2023-10-11 DIAGNOSIS — F331 Major depressive disorder, recurrent, moderate: Secondary | ICD-10-CM | POA: Diagnosis not present

## 2023-10-18 NOTE — Progress Notes (Unsigned)
Office Visit Note  Patient: Robin [name]             Date of Birth: 10-18-1970           MRN: [id number]             PCP: Caesar Bookman, NP Referring: Caesar Bookman, NP Visit Date: 11/01/2023 Occupation: @GUAROCC @  Subjective:  Chronic pain in both feet  History of Present Illness: Robin [name] is a 53 y.o. female with history of systemic lupus erythematosus.  Patient is currently taking Plaquenil 200 mg 1 tablet by mouth twice daily.  She continues to tolerate Plaquenil without any side effects and has not had any recent gaps in therapy.  Patient continues to experience intermittent discomfort in her SI joints as well as pain in both feet.  Patient has established care with Dr. Odis Hollingshead and plans to proceed with surgical intervention in the future.  She remains on naltrexone daily, which she finds to be helpful.   She is using refresh eye drops and a saline nasal spray for sicca symptoms relief.  She is occasional oral ulcers but no nasal ulcerations.  She has not any recent rashes.  She has had less frequent symptoms of Raynaud's phenomenon.  Patient mains active increasing her activity level and exercise regimen.  Activities of Daily Living:  Patient reports morning stiffness for 1 hour.   Patient Reports nocturnal pain.  Difficulty dressing/grooming: Denies Difficulty climbing stairs: Denies Difficulty getting out of chair: Denies Difficulty using hands for taps, buttons, cutlery, and/or writing: Reports  Review of Systems  Constitutional:  Positive for fatigue.  HENT:  Positive for mouth sores, mouth dryness and nose dryness.   Eyes:  Positive for dryness. Negative for pain.  Respiratory:  Negative for shortness of breath.   Cardiovascular:  Negative for chest pain and palpitations.  Gastrointestinal:  Positive for constipation. Negative for blood in stool and diarrhea.  Endocrine: Negative for increased urination.  Genitourinary:  Negative for involuntary  urination.  Musculoskeletal:  Positive for joint pain, gait problem, joint pain, joint swelling, myalgias, morning stiffness, muscle tenderness and myalgias. Negative for muscle weakness.  Skin:  Positive for color change, rash and sensitivity to sunlight. Negative for hair loss.  Allergic/Immunologic: Negative for susceptible to infections.  Neurological:  Positive for headaches. Negative for dizziness.  Hematological:  Negative for swollen glands.  Psychiatric/Behavioral:  Negative for depressed mood and sleep disturbance. The patient is not nervous/anxious.     PMFS History:  Patient Active Problem List   Diagnosis Date Noted   Partial nontraumatic tear of right rotator cuff 12/22/2022   Left hamstring injury 07/15/2022   Labral tear of shoulder, degenerative, right 06/21/2022   Left carpal tunnel syndrome 05/26/2022   Foot sprain, left, initial encounter 05/19/2022   Baker's cyst of knee, left 04/21/2022   Right carpal tunnel syndrome 03/16/2022   Flushing 04/14/2021   Hypermobility syndrome 11/27/2020   Seasonal and perennial allergic rhinitis 11/06/2018   Mild intermittent asthma without complication 11/06/2018   Flexural atopic dermatitis 11/06/2018   History of hyperlipidemia 11/08/2016   History of anxiety 11/08/2016   History of retinal tear 11/08/2016   History of Bilateral plantar fasciitis 08/10/2016   High risk medication use 07/04/2016   Lupus (HCC) 05/23/2016   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HCC) 03/18/2016   ADHD (attention deficit hyperactivity disorder) 12/22/2014   Raynaud's phenomenon 10/17/2013   Hyperlipidemia 09/28/2011    Past Medical History:  Diagnosis Date  ADD (attention deficit disorder)    Allergy    Anemia    Anorexia nervosa with bulimia    Per [facility] New Patient Packet   Anxiety    Asthma    Carpal tunnel syndrome    Per [facility] New Patient Packet   Depression    Dry eyes    Per [facility] New Patient Packet   Dry mouth    Per [facility] New Patient Packet    Fibroids    Per [facility] New Patient Packet   History of hip x-ray 2021   Per [facility] New Patient Packet   History of Papanicolaou smear of cervix 2021   Per [facility] New Patient Packet, Dr.Pickens   Hyperlipidemia    Ileitis    Lupus    PONV (postoperative nausea and vomiting)    mild nausea    Raynaud disease    Retinal tear    Per [facility] New Patient Packet   Systemic lupus erythematosus (HCC)     Family History  Problem Relation Age of Onset   Heart disease Mother 60       CAD/stenting   Hyperlipidemia Mother    Arthritis Mother        ostearthritis   Hypertension Mother    Kidney disease Mother    Irritable bowel syndrome Mother    Stroke Mother 36       CVA   Fibromyalgia Mother    Eczema Mother    Sjogren's syndrome Mother    Hyperlipidemia Father    Neuropathy Father    Allergies Father        shellfish   Prostatitis Father    Hyperlipidemia Sister    ADD / ADHD Sister    Bipolar disorder Sister    Depression Sister    Hypertension Brother    OCD Brother    Anxiety disorder Daughter    ADD / ADHD Daughter    Asthma Daughter    OCD Son    Colon cancer Neg Hx    Inflammatory bowel disease Neg Hx    Allergic rhinitis Neg Hx    Angioedema Neg Hx    Atopy Neg Hx    Immunodeficiency Neg Hx    Urticaria Neg Hx    Past Surgical History:  Procedure Laterality Date   ABLATION  2022   CARPAL TUNNEL RELEASE Right    08/2019   CESAREAN SECTION  2003   CESAREAN SECTION  2005   Per [facility] New Patient Packet   COLONOSCOPY  2017   Dr.Jacobs   cyst drained Left 2023   knee   DENTAL SURGERY  2009   Fromed tooth and chronic infection   DILITATION & CURRETTAGE/HYSTROSCOPY WITH NOVASURE ABLATION N/A 06/03/2020   Procedure: DILATATION & CURETTAGE/HYSTEROSCOPY WITH NOVASURE ABLATION;  Surgeon: Os[place] Bing, MD;  Location: [facility];  Service: Gynecology;  Laterality: N/A;   ENDOMETRIAL BIOPSY  04/20/2020       EYE SURGERY Bilateral    cryoretinopexy   hang  nail procedure Left 2024   great toe   Social History   Social History Narrative   Marital status: married x 17 years; happily married      Children: 2 children (15, 90)      Lives: with husband, 2 children.      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 4 (including me)       Pets: 1 dog, 2 cats       Highest level or education completed: Undergraduate degree       Current/Past profession: Gaffer       Exercise:        Yes          Type and how often: 30-60 min walking 5 days a week. Pilate's 1 x a week, and horseback riding 2 times a week          Living Will: No   DNR: no, would like to discuss one    POA/HPOA: No      Functional Status:   Do you have difficulty bathing or dressing yourself? Left blank    Do you have difficulty preparing food or eating? Left blank   Do you have difficulty managing your medications? Left blank   Do you have difficulty managing your finances? Left blank   Do you have difficulty affording your medications? Left blank   Immunization History  Administered Date(s) Administered   Influenza,inj,Quad PF,6+ Mos 05/12/2014, 11/27/2015, 05/23/2016, 10/30/2017, 07/30/2018   Influenza-Unspecified 07/08/2022, 07/10/2023   Moderna Sars-Covid-2 Vaccination 10/27/2020, 10/31/2020   PFIZER(Purple Top)SARS-COV-2 Vaccination 12/06/2019, 12/16/2019, 05/27/2020, 07/07/2022   Pfizer(Comirnaty)Fall Seasonal Vaccine 12 years and older 07/10/2023   Pneumococcal  Conjugate-13 05/23/2016   Tdap 09/28/2011, 08/12/2020     Objective: Vital Signs: BP 122/80 (BP Location: Left Arm, Patient Position: Sitting, Cuff Size: Normal)   Pulse 77   Resp 15   Ht 5\' 6"  (1.676 m)   Wt 172 lb 6.4 oz (78.2 kg)   BMI 27.83 kg/m    Physical Exam Vitals and nursing note reviewed.  Constitutional:      Appearance: She is well-developed.  HENT:     Head: Normocephalic and atraumatic.  Eyes:     Conjunctiva/sclera: Conjunctivae normal.  Cardiovascular:     Rate and Rhythm: Normal rate and regular rhythm.     Heart sounds: Normal heart sounds.  Pulmonary:     Effort: Pulmonary effort is normal.     Breath sounds: Normal breath sounds.  Abdominal:     General: Bowel sounds are normal.     Palpations: Abdomen is soft.  Musculoskeletal:     Cervical back: Normal range of motion.  Skin:    General: Skin is warm and dry.     Capillary Refill: Capillary refill takes 2 to 3 seconds.  Neurological:     Mental Status: She is alert and oriented to person, place, and time.  Psychiatric:        Behavior: Behavior normal.      Musculoskeletal Exam: C-spine, thoracic spine, lumbar spine and good range of motion.  Shoulder joints have good range of motion with no discomfort currently.  Elbow joints, wrist joints, MCPs, PIPs, DIPs have good range of motion with no synovitis.  Mild thickening of MCP joints but no active  synovitis was noted.  Complete fist formation noted bilaterally.  Hip joints have good range of motion with no groin pain.  Knee joints have good range of motion no warmth or effusion.  Ankle joints have good range of motion with no tenderness or synovitis.  CDAI Exam: CDAI Score: -- Patient Global: --; Provider Global: -- Swollen: --; Tender: -- Joint Exam 11/01/2023   No joint exam has been documented for this visit   There is currently no information documented on the homunculus. Go to the Rheumatology activity and complete the homunculus joint  exam.  Investigation: No additional findings.  Imaging: No results found.  Recent Labs: Lab Results  Component Value Date   WBC 3.7 (L) 05/30/2023   HGB 13.5 05/30/2023   PLT 265 05/30/2023   NA 139 05/30/2023   K 4.3 05/30/2023   CL 102 05/30/2023   CO2 29 05/30/2023   GLUCOSE 73 05/30/2023   BUN 14 05/30/2023   CREATININE 0.88 05/30/2023   BILITOT 0.5 05/30/2023   ALKPHOS 26 (L) 06/01/2020   AST 20 05/30/2023   ALT 16 05/30/2023   PROT 6.9 05/30/2023   ALBUMIN 3.8 06/01/2020   CALCIUM 9.4 05/30/2023   GFRAA 93 01/26/2021    Speciality Comments: PLQ Eye Exam: 10/05/2022 @ [facility] (in [place]) Follow up in 1 year.   ANA 1:320 speckled, history of oral ulcers, lymphadenopathy, photosensitivity, Raynauds, erythema nodosum, inflammatory arthritis.  Procedures:  No procedures performed Allergies: Cromolyn sodium, Avelox [moxifloxacin hcl in nacl], Cefaclor, Dust mite extract, Latex, Mold extract [trichophyton], Moxifloxacin, Red wine complex [germanium], Meloxicam, and Naproxen     Assessment / Plan:     Visit Diagnoses: Other systemic lupus erythematosus with other organ involvement (HCC) - ANA 1:320 speckled, history of oral ulcers, lymphadenopathy, photosensitivity, Raynauds, erythema nodosum, inflammatory arthritis:  She has not had any signs or symptoms of a systemic lupus flare.  She is clinically been doing well taking Plaquenil 200 mg 1 tablet by mouth twice daily.  She continues to tolerate Plaquenil without any side effects.  No synovitis was noted on examination today.  Overall her symptoms have remained stable on the current treatment regimen.  She remains on naltrexone 6 mg daily which she continues to find to be helpful.  Her sicca symptoms have been manageable with the use of refresh and saline nasal spray.  She has occasional oral ulcers but no nasal ulcerations recently.  She has had less frequent symptoms of Raynaud's phenomenon.  No  recent rashes. Lab work from 05/30/23 was reviewed today in the office: ESR WNL, dsDNA negative, and complements WNL.  The following lab work will be obtained today for further evaluation.  Patient will remain on Plaquenil as prescribed.  She was advised to notify us if she develops signs or symptoms of a flare.  She will follow-up in the office in 5 months or sooner if needed. - Plan: Protein / creatinine ratio, urine, CBC with Differential/Platelet, COMPLETE METABOLIC PANEL WITH GFR, Anti-DNA antibody, double-stranded, C3 and C4, Sedimentation rate, ANA, hydroxychloroquine (PLAQUENIL) 200 MG tablet  High risk medication use - Plaquenil 200 mg 1 tablet by mouth twice daily. PLQ Eye Exam: 10/05/2022 @ [facility] (in [place]) Follow up in 1 year.  She is scheduled for her next Plaquenil eye examination in April 2025. CBC and CMP updated 05/30/23.  Orders for CBC and CMP were released today.  - Plan: CBC with Differential/Platelet, COMPLETE METABOLIC PANEL WITH GFR Patient will  be having an upcoming dental implant procedure as well as will be undergoing surgical intervention for chronic pain in her feet.  From a rheumatologic standpoint she has been cleared to proceed with surgical intervention--note was provided-signed off by Dr. Corliss Skains on 10/27/23.   Raynaud's phenomenon without gangrene: She has been experiencing less frequency of symptoms of Raynaud's phenomenon.  Slightly delayed capillary refill 2 to 3 seconds.  No digital ulcerations or signs of gangrene noted.  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   Chronic right shoulder pain - Dr. Roda Shutters. MRI 08/09/2022: Moderate tendinosis supraspinatus tendon with partial-thickness bursal surface tear and partial-thickness articular surface tear. She had a right glenohumeral joint cortisone injection on 10/17/2022.  She has good range of motion on examination today.  Primary osteoarthritis of both hands: No synovitis noted on examination today.     Primary osteoarthritis of both feet: Under the care of Dr. Gray Bernhardt to proceed with surgical intervention but does not currently have a surgical date scheduled.  Hypermobility of joint - Followed by Dr. Jordan Likes.  Other fatigue: Chronic, stable.  Patient has been trying to exercise on a regular basis particularly maintaining her walking regimen.  S/P carpal tunnel release - Right--not currently symptomatic.  She is not planning to proceed with left carpal tunnel release at this time.  Patient had a left carpal tunnel cortisone injection on 05/09/23, which helped alleviate her symptoms and she has been using a carpal tunnel splint as advised.  Chronic left SI joint pain: Patient continues to experience intermittent discomfort in her SI joints.  Patient has tried to remain active exercising on a regular basis.  Overall her symptoms have been manageable.  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    Dispense:  180 tablet    Refill:  0     Follow-Up Instructions: Return in about 5 months (around 03/30/2024) for Systemic lupus erythematosus.   Gearldine Bienenstock, PA-C  Note - This record has been created using Dragon software.  Chart creation errors have been sought, but may not always  have been located. Such creation errors do not reflect on  the standard of  medical care.

## 2023-10-25 DIAGNOSIS — F331 Major depressive disorder, recurrent, moderate: Secondary | ICD-10-CM | POA: Diagnosis not present

## 2023-10-26 NOTE — Telephone Encounter (Signed)
I called patient, patient does not have surgeries scheduled yet, patient will call [facility] to have surgery clearance form faxed to our office, patient states Dr. Percell Boston does not have a form and needs letter faxed from our office.

## 2023-10-27 ENCOUNTER — Encounter: Payer: Self-pay | Admitting: *Deleted

## 2023-10-31 ENCOUNTER — Ambulatory Visit: Payer: BC Managed Care – PPO | Admitting: Physician Assistant

## 2023-11-01 ENCOUNTER — Ambulatory Visit: Payer: BC Managed Care – PPO | Attending: Physician Assistant | Admitting: Physician Assistant

## 2023-11-01 ENCOUNTER — Encounter: Payer: Self-pay | Admitting: Physician Assistant

## 2023-11-01 VITALS — BP 122/80 | HR 77 | Resp 15 | Ht 66.0 in | Wt 172.4 lb

## 2023-11-01 DIAGNOSIS — M19071 Primary osteoarthritis, right ankle and foot: Secondary | ICD-10-CM

## 2023-11-01 DIAGNOSIS — L52 Erythema nodosum: Secondary | ICD-10-CM | POA: Diagnosis not present

## 2023-11-01 DIAGNOSIS — I7300 Raynaud's syndrome without gangrene: Secondary | ICD-10-CM | POA: Diagnosis not present

## 2023-11-01 DIAGNOSIS — M249 Joint derangement, unspecified: Secondary | ICD-10-CM

## 2023-11-01 DIAGNOSIS — R5383 Other fatigue: Secondary | ICD-10-CM

## 2023-11-01 DIAGNOSIS — Z79899 Other long term (current) drug therapy: Secondary | ICD-10-CM

## 2023-11-01 DIAGNOSIS — F900 Attention-deficit hyperactivity disorder, predominantly inattentive type: Secondary | ICD-10-CM

## 2023-11-01 DIAGNOSIS — M3219 Other organ or system involvement in systemic lupus erythematosus: Secondary | ICD-10-CM

## 2023-11-01 DIAGNOSIS — M19041 Primary osteoarthritis, right hand: Secondary | ICD-10-CM

## 2023-11-01 DIAGNOSIS — M533 Sacrococcygeal disorders, not elsewhere classified: Secondary | ICD-10-CM

## 2023-11-01 DIAGNOSIS — Z9889 Other specified postprocedural states: Secondary | ICD-10-CM

## 2023-11-01 DIAGNOSIS — M19072 Primary osteoarthritis, left ankle and foot: Secondary | ICD-10-CM

## 2023-11-01 DIAGNOSIS — Z8639 Personal history of other endocrine, nutritional and metabolic disease: Secondary | ICD-10-CM

## 2023-11-01 DIAGNOSIS — Z8669 Personal history of other diseases of the nervous system and sense organs: Secondary | ICD-10-CM

## 2023-11-01 DIAGNOSIS — Z8659 Personal history of other mental and behavioral disorders: Secondary | ICD-10-CM

## 2023-11-01 DIAGNOSIS — E559 Vitamin D deficiency, unspecified: Secondary | ICD-10-CM

## 2023-11-01 DIAGNOSIS — M25511 Pain in right shoulder: Secondary | ICD-10-CM

## 2023-11-01 DIAGNOSIS — M19042 Primary osteoarthritis, left hand: Secondary | ICD-10-CM

## 2023-11-01 DIAGNOSIS — K5000 Crohn's disease of small intestine without complications: Secondary | ICD-10-CM

## 2023-11-01 DIAGNOSIS — G8929 Other chronic pain: Secondary | ICD-10-CM

## 2023-11-01 MED ORDER — HYDROXYCHLOROQUINE SULFATE 200 MG PO TABS: 200.0000 mg | ORAL_TABLET | Freq: Two times a day (BID) | ORAL | 0 refills | Status: DC

## 2023-11-02 ENCOUNTER — Ambulatory Visit: Payer: BC Managed Care – PPO | Admitting: Physician Assistant

## 2023-11-02 NOTE — Progress Notes (Signed)
CBC and CMP normal.  Urine protein creatinine ratio negative.  Sed rate normal, complements normal.  ANA and double-stranded DNA pending.

## 2023-11-03 LAB — COMPLETE METABOLIC PANEL WITH GFR
AG Ratio: 1.9 (calc) (ref 1.0–2.5)
ALT: 16 U/L (ref 6–29)
AST: 20 U/L (ref 10–35)
Albumin: 4.8 g/dL (ref 3.6–5.1)
Alkaline phosphatase (APISO): 39 U/L (ref 37–153)
BUN: 16 mg/dL (ref 7–25)
CO2: 34 mmol/L — ABNORMAL HIGH (ref 20–32)
Calcium: 10.1 mg/dL (ref 8.6–10.4)
Chloride: 101 mmol/L (ref 98–110)
Creat: 0.81 mg/dL (ref 0.50–1.03)
Globulin: 2.5 g/dL (ref 1.9–3.7)
Glucose, Bld: 78 mg/dL (ref 65–99)
Potassium: 4.5 mmol/L (ref 3.5–5.3)
Sodium: 139 mmol/L (ref 135–146)
Total Bilirubin: 0.5 mg/dL (ref 0.2–1.2)
Total Protein: 7.3 g/dL (ref 6.1–8.1)
eGFR: 88 mL/min/{1.73_m2} (ref >=60)

## 2023-11-03 LAB — CBC WITH DIFFERENTIAL/PLATELET
Absolute Lymphocytes: 1260 {cells}/uL (ref 850–3900)
Absolute Monocytes: 469 {cells}/uL (ref 200–950)
Basophils Absolute: 22 {cells}/uL (ref 0–200)
Basophils Relative: 0.5 %
Eosinophils Absolute: 237 {cells}/uL (ref 15–500)
Eosinophils Relative: 5.5 %
HCT: 42.2 % (ref 35.0–45.0)
Hemoglobin: 13.7 g/dL (ref 11.7–15.5)
MCH: 30.9 pg (ref 27.0–33.0)
MCHC: 32.5 g/dL (ref 32.0–36.0)
MCV: 95 fL (ref 80.0–100.0)
MPV: 10 fL (ref 7.5–12.5)
Monocytes Relative: 10.9 %
Neutro Abs: 2313 {cells}/uL (ref 1500–7800)
Neutrophils Relative %: 53.8 %
Platelets: 289 10*3/uL (ref 140–400)
RBC: 4.44 10*6/uL (ref 3.80–5.10)
RDW: 12.5 % (ref 11.0–15.0)
Total Lymphocyte: 29.3 %
WBC: 4.3 10*3/uL (ref 3.8–10.8)

## 2023-11-03 LAB — PROTEIN / CREATININE RATIO, URINE
Creatinine, Urine: 18 mg/dL — ABNORMAL LOW (ref 20–275)
Total Protein, Urine: <4 mg/dL — ABNORMAL LOW (ref 5–24)

## 2023-11-03 LAB — C3 AND C4
C3 Complement: 106 mg/dL (ref 83–193)
C4 Complement: 27 mg/dL (ref 15–57)

## 2023-11-03 LAB — ANA: Anti Nuclear Antibody (ANA): NEGATIVE

## 2023-11-03 LAB — ANTI-DNA ANTIBODY, DOUBLE-STRANDED: ds DNA Ab: <1 [IU]/mL

## 2023-11-03 LAB — SEDIMENTATION RATE: Sed Rate: 2 mm/h (ref 0–20)

## 2023-11-03 NOTE — Progress Notes (Signed)
Double-stranded DNA negative, ANA pending

## 2023-11-05 NOTE — Progress Notes (Signed)
 ANA negative

## 2023-11-08 DIAGNOSIS — F331 Major depressive disorder, recurrent, moderate: Secondary | ICD-10-CM | POA: Diagnosis not present

## 2023-11-22 DIAGNOSIS — F331 Major depressive disorder, recurrent, moderate: Secondary | ICD-10-CM | POA: Diagnosis not present

## 2023-12-05 ENCOUNTER — Encounter: Payer: Self-pay | Admitting: Orthopaedic Surgery

## 2023-12-12 ENCOUNTER — Other Ambulatory Visit: Payer: Self-pay | Admitting: Physician Assistant

## 2023-12-12 MED ORDER — HYDROCODONE-ACETAMINOPHEN 5-325 MG PO TABS: 1.0000 | ORAL_TABLET | Freq: Three times a day (TID) | ORAL | 0 refills | Status: AC | PRN

## 2023-12-13 DIAGNOSIS — F331 Major depressive disorder, recurrent, moderate: Secondary | ICD-10-CM | POA: Diagnosis not present

## 2023-12-14 DIAGNOSIS — G5602 Carpal tunnel syndrome, left upper limb: Secondary | ICD-10-CM | POA: Diagnosis not present

## 2023-12-14 HISTORY — PX: CARPAL TUNNEL RELEASE: SHX101

## 2023-12-15 ENCOUNTER — Other Ambulatory Visit: Payer: Self-pay | Admitting: Physician Assistant

## 2023-12-15 MED ORDER — ONDANSETRON HCL 4 MG PO TABS: 4.0000 mg | ORAL_TABLET | Freq: Three times a day (TID) | ORAL | 0 refills | Status: AC | PRN

## 2023-12-18 ENCOUNTER — Other Ambulatory Visit: Payer: Self-pay | Admitting: Obstetrics and Gynecology

## 2023-12-18 ENCOUNTER — Ambulatory Visit

## 2023-12-18 DIAGNOSIS — Z1231 Encounter for screening mammogram for malignant neoplasm of breast: Secondary | ICD-10-CM

## 2023-12-18 NOTE — Progress Notes (Signed)
 Patient came in. She had carpal tunnel release recently and her bandage is falling off. I removed bandage, cleaned and applied betadine. Reapplied a dressing and water proof bandage.

## 2023-12-20 DIAGNOSIS — H524 Presbyopia: Secondary | ICD-10-CM | POA: Diagnosis not present

## 2023-12-20 DIAGNOSIS — Z79899 Other long term (current) drug therapy: Secondary | ICD-10-CM | POA: Diagnosis not present

## 2023-12-20 DIAGNOSIS — H04123 Dry eye syndrome of bilateral lacrimal glands: Secondary | ICD-10-CM | POA: Diagnosis not present

## 2023-12-20 DIAGNOSIS — M329 Systemic lupus erythematosus, unspecified: Secondary | ICD-10-CM | POA: Diagnosis not present

## 2023-12-20 DIAGNOSIS — H5203 Hypermetropia, bilateral: Secondary | ICD-10-CM | POA: Diagnosis not present

## 2023-12-21 ENCOUNTER — Ambulatory Visit (INDEPENDENT_AMBULATORY_CARE_PROVIDER_SITE_OTHER): Admitting: Physician Assistant

## 2023-12-21 DIAGNOSIS — G5602 Carpal tunnel syndrome, left upper limb: Secondary | ICD-10-CM

## 2023-12-21 DIAGNOSIS — Z9889 Other specified postprocedural states: Secondary | ICD-10-CM

## 2023-12-21 NOTE — Progress Notes (Signed)
 Post-Op Visit Note   Patient: Robin [name]           Date of Birth: [birth year]           MRN: [id number] Visit Date: 12/21/2023 PCP: Caesar Bookman, NP   Assessment & Plan:  Chief Complaint:  Chief Complaint  Patient presents with   Left Hand - Routine Post Op   Visit Diagnoses:  1. Left carpal tunnel syndrome   2. S/P carpal tunnel release     Plan: Patient is a 53 year old who comes in today 1 week status post left carpal tunnel release 12/14/23.  This was for moderate to severe carpal tunnel syndrome.  She has been doing well.  She is taking over-the-counter NSAIDs as needed for pain.  She denies any paresthesias at rest but does note some tingling when she extends her wrist.  Overall, doing much better.  Examination of the left hand reveals a well-healing surgical incision.  Nylon sutures in place.  Fingers warm well-perfused.  She is neurovascularly intact distally.  Today, her wound was cleaned and recovered.  Velcro splint was applied.  She will wear this at all times for the next week.  She may begin nerve gliding exercises.  Follow-up next week for suture removal.  No heavy lifting or submerging her hand underwater until she is 4 weeks postop.  Call with concerns or questions.  Follow-Up Instructions: Return in about 1 week (around 12/28/2023).   Orders:  No orders of the defined types were placed in this encounter.  No orders of the defined types were placed in this encounter.   Imaging: No new imaging  PMFS History: Patient Active Problem List   Diagnosis Date Noted   Partial nontraumatic tear of right rotator cuff 12/22/2022   Left hamstring injury 07/15/2022   Labral tear of shoulder, degenerative, right 06/21/2022   Left carpal tunnel syndrome 05/26/2022   Foot sprain, left, initial encounter 05/19/2022   Baker's cyst of knee, left 04/21/2022   Right carpal tunnel syndrome 03/16/2022   Flushing 04/14/2021   Hypermobility syndrome 11/27/2020   Seasonal  and perennial allergic rhinitis 11/06/2018   Mild intermittent asthma without complication 11/06/2018   Flexural atopic dermatitis 11/06/2018   History of hyperlipidemia 11/08/2016   History of anxiety 11/08/2016   History of retinal tear 11/08/2016   History of Bilateral plantar fasciitis 08/10/2016   High risk medication use 07/04/2016   Lupus (HCC) 05/23/2016   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HCC) 03/18/2016   ADHD (attention deficit hyperactivity disorder) 12/22/2014   Raynaud's phenomenon 10/17/2013   Hyperlipidemia 09/28/2011   Past Medical History:  Diagnosis Date   ADD (attention deficit disorder)    Allergy    Anemia    Anorexia nervosa with bulimia    Per [facility] New Patient Packet   Anxiety    Asthma    Carpal tunnel syndrome    Per [facility] New Patient Packet   Depression    Dry eyes    Per [facility] New Patient Packet   Dry mouth    Per [facility] New Patient Packet   Fibroids    Per [facility] New Patient Packet   History of hip x-ray 2021   Per [facility] New Patient Packet   History of Papanicolaou smear of cervix 2021   Per [facility] New Patient Packet, Dr.Pickens   Hyperlipidemia    Ileitis    Lupus    PONV (postoperative nausea and vomiting)    mild nausea    Raynaud  disease    Retinal tear    Per [facility] New Patient Packet   Systemic lupus erythematosus (HCC)     Family History  Problem Relation Age of Onset   Heart disease Mother 81       CAD/stenting   Hyperlipidemia Mother    Arthritis Mother        ostearthritis   Hypertension Mother    Kidney disease Mother    Irritable bowel syndrome Mother    Stroke Mother 43       CVA   Fibromyalgia Mother    Eczema Mother    Sjogren's syndrome Mother    Hyperlipidemia Father    Neuropathy Father    Allergies Father        shellfish   Prostatitis Father    Hyperlipidemia Sister    ADD / ADHD Sister    Bipolar disorder Sister    Depression Sister    Hypertension Brother    OCD Brother    Anxiety disorder Daughter    ADD / ADHD Daughter     Asthma Daughter    OCD Son    Colon cancer Neg Hx    Inflammatory bowel disease Neg Hx    Allergic rhinitis Neg Hx    Angioedema Neg Hx    Atopy Neg Hx    Immunodeficiency Neg Hx    Urticaria Neg Hx     Past Surgical History:  Procedure Laterality Date   ABLATION  2022   CARPAL TUNNEL RELEASE Right    08/2019   CESAREAN SECTION  2003   CESAREAN SECTION  2005   Per [facility] New Patient Packet   COLONOSCOPY  2017   Dr.Jacobs   cyst drained Left 2023   knee   DENTAL SURGERY  2009   Fromed tooth and chronic infection   DILITATION & CURRETTAGE/HYSTROSCOPY WITH NOVASURE ABLATION N/A 06/03/2020   Procedure: DILATATION & CURETTAGE/HYSTEROSCOPY WITH NOVASURE ABLATION;  Surgeon: [place] Bing, MD;  Location: [facility];  Service: Gynecology;  Laterality: N/A;   ENDOMETRIAL BIOPSY  04/20/2020       EYE SURGERY Bilateral    cryoretinopexy   hang nail procedure Left 2024   great toe   Social History   Occupational History   Not on file  Tobacco Use   Smoking status: Former    Current packs/day: 0.00    Average packs/day: 0.8 packs/day for 10.0 years (7.5 ttl pk-yrs)    Types: Cigarettes    Start date: 07/06/1991    Quit date: 07/05/2001    Years since quitting: 22.4    Passive exposure: Never   Smokeless tobacco: Never  Vaping Use   Vaping status: Never Used  Substance and Sexual Activity   Alcohol use: Yes    Comment: occ   Drug use: Never   Sexual activity: Yes    Birth control/protection: Condom

## 2023-12-28 ENCOUNTER — Encounter: Payer: Self-pay | Admitting: Physician Assistant

## 2023-12-28 ENCOUNTER — Encounter: Admitting: Physician Assistant

## 2023-12-28 ENCOUNTER — Ambulatory Visit: Admitting: Physician Assistant

## 2023-12-28 DIAGNOSIS — G5602 Carpal tunnel syndrome, left upper limb: Secondary | ICD-10-CM

## 2023-12-28 NOTE — Progress Notes (Signed)
 Office Visit Note   Patient: Robin [name]           Date of Birth: [birth year]           MRN: [id number] Visit Date: 12/28/2023              Requested by: Caesar Bookman, NP [address] [place],  [place] [address] PCP: Caesar Bookman, NP  No chief complaint on file.     HPI: Patient is a pleasant 53 year old woman who is 2 weeks status post left carpal tunnel release by Dr. Roda Shutters..  She had a similar procedure on the right side and is doing quite well.  Assessment & Plan: Visit Diagnoses: Status post left carpal tunnel release, doing well  Plan: May discontinue the splint.  Should avoid heavy lifting.  Can begin to cleanse the area but should not soak the hand for another 2 weeks we will follow-up with Dr. Roda Shutters in 4 weeks  Follow-Up Instructions: No follow-ups on file.   Ortho Exam  Patient is alert, oriented, no adenopathy, well-dressed, normal affect, normal respiratory effort. Examination of her wrist she has no swelling no erythema she has sensation is intact a little soreness with opposition of the small finger to the thumb.  Well-healed surgical incision without drainage  Imaging: No results found. No images are attached to the encounter.  Labs: Lab Results  Component Value Date   ESRSEDRATE 2 11/01/2023   ESRSEDRATE 2 05/30/2023   ESRSEDRATE 6 11/30/2022   CRP 3.7 (H) 04/14/2016     Lab Results  Component Value Date   ALBUMIN 3.8 06/01/2020   ALBUMIN 4.6 02/20/2020   ALBUMIN 4.8 04/19/2017    No results found for: "MG" Lab Results  Component Value Date   VD25OH 66 11/30/2022   VD25OH 36 09/23/2021   VD25OH 37 08/31/2018    No results found for: "PREALBUMIN"    Latest Ref Rng & Units 11/01/2023    9:32 AM 05/30/2023    8:32 AM 11/30/2022   11:20 AM  CBC EXTENDED  WBC 3.8 - 10.8 Thousand/uL 4.3  3.7  4.6   RBC 3.80 - 5.10 Million/uL 4.44  4.27  4.42   Hemoglobin 11.7 - 15.5 g/dL 19.1  47.8  29.5   HCT 35.0 - 45.0 % 42.2  40.4  41.5   Platelets  140 - 400 Thousand/uL 289  265  273   NEUT# 1,500 - 7,800 cells/uL 2,313  1,828  2,148   Lymph# 850 - 3,900 cells/uL  1,373  1,794      There is no height or weight on file to calculate BMI.  Orders:  No orders of the defined types were placed in this encounter.  No orders of the defined types were placed in this encounter.    Procedures: No procedures performed  Clinical Data: No additional findings.  ROS:  All other systems negative, except as noted in the HPI. Review of Systems  Objective: Vital Signs: There were no vitals taken for this visit.  Specialty Comments:  No specialty comments available.  PMFS History: Patient Active Problem List   Diagnosis Date Noted   Partial nontraumatic tear of right rotator cuff 12/22/2022   Left hamstring injury 07/15/2022   Labral tear of shoulder, degenerative, right 06/21/2022   Left carpal tunnel syndrome 05/26/2022   Foot sprain, left, initial encounter 05/19/2022   Baker's cyst of knee, left 04/21/2022   Right carpal tunnel syndrome 03/16/2022   Flushing 04/14/2021  Hypermobility syndrome 11/27/2020   Seasonal and perennial allergic rhinitis 11/06/2018   Mild intermittent asthma without complication 11/06/2018   Flexural atopic dermatitis 11/06/2018   History of hyperlipidemia 11/08/2016   History of anxiety 11/08/2016   History of retinal tear 11/08/2016   History of Bilateral plantar fasciitis 08/10/2016   High risk medication use 07/04/2016   Lupus (HCC) 05/23/2016   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HCC) 03/18/2016   ADHD (attention deficit hyperactivity disorder) 12/22/2014   Raynaud's phenomenon 10/17/2013   Hyperlipidemia 09/28/2011   Past Medical History:  Diagnosis Date   ADD (attention deficit disorder)    Allergy    Anemia    Anorexia nervosa with bulimia    Per [facility] New Patient Packet   Anxiety    Asthma    Carpal tunnel syndrome    Per [facility] New Patient Packet   Depression    Dry eyes    Per [facility] New Patient  Packet   Dry mouth    Per [facility] New Patient Packet   Fibroids    Per [facility] New Patient Packet   History of hip x-ray 2021   Per [facility] New Patient Packet   History of Papanicolaou smear of cervix 2021   Per [facility] New Patient Packet, Dr.Pickens   Hyperlipidemia    Ileitis    Lupus    PONV (postoperative nausea and vomiting)    mild nausea    Raynaud disease    Retinal tear    Per [facility] New Patient Packet   Systemic lupus erythematosus (HCC)     Family History  Problem Relation Age of Onset   Heart disease Mother 54       CAD/stenting   Hyperlipidemia Mother    Arthritis Mother        ostearthritis   Hypertension Mother    Kidney disease Mother    Irritable bowel syndrome Mother    Stroke Mother 40       CVA   Fibromyalgia Mother    Eczema Mother    Sjogren's syndrome Mother    Hyperlipidemia Father    Neuropathy Father    Allergies Father        shellfish   Prostatitis Father    Hyperlipidemia Sister    ADD / ADHD Sister    Bipolar disorder Sister    Depression Sister    Hypertension Brother    OCD Brother    Anxiety disorder Daughter    ADD / ADHD Daughter    Asthma Daughter    OCD Son    Colon cancer Neg Hx    Inflammatory bowel disease Neg Hx    Allergic rhinitis Neg Hx    Angioedema Neg Hx    Atopy Neg Hx    Immunodeficiency Neg Hx    Urticaria Neg Hx     Past Surgical History:  Procedure Laterality Date   ABLATION  2022   CARPAL TUNNEL RELEASE Right    08/2019   CESAREAN SECTION  2003   CESAREAN SECTION  2005   Per [facility] New Patient Packet   COLONOSCOPY  2017   Dr.Jacobs   cyst drained Left 2023   knee   DENTAL SURGERY  2009   Fromed tooth and chronic infection   DILITATION & CURRETTAGE/HYSTROSCOPY WITH NOVASURE ABLATION N/A 06/03/2020   Procedure: DILATATION & CURETTAGE/HYSTEROSCOPY WITH NOVASURE ABLATION;  Surgeon: [place] Bing, MD;  Location: [facility];  Service: Gynecology;  Laterality: N/A;   ENDOMETRIAL BIOPSY  04/20/2020  EYE SURGERY Bilateral    cryoretinopexy   hang nail procedure Left 2024   great toe   Social History   Occupational History   Not on file  Tobacco Use   Smoking status: Former    Current packs/day: 0.00    Average packs/day: 0.8 packs/day for 10.0 years (7.5 ttl pk-yrs)    Types: Cigarettes    Start date: 07/06/1991    Quit date: 07/05/2001    Years since quitting: 22.4    Passive exposure: Never   Smokeless tobacco: Never  Vaping Use   Vaping status: Never Used  Substance and Sexual Activity   Alcohol use: Yes    Comment: occ   Drug use: Never   Sexual activity: Yes    Birth control/protection: Condom

## 2024-01-12 ENCOUNTER — Encounter: Payer: Self-pay | Admitting: Physician Assistant

## 2024-01-12 ENCOUNTER — Ambulatory Visit (INDEPENDENT_AMBULATORY_CARE_PROVIDER_SITE_OTHER): Admitting: Physician Assistant

## 2024-01-12 DIAGNOSIS — G5602 Carpal tunnel syndrome, left upper limb: Secondary | ICD-10-CM

## 2024-01-12 DIAGNOSIS — Z9889 Other specified postprocedural states: Secondary | ICD-10-CM

## 2024-01-12 NOTE — Progress Notes (Signed)
 Post-Op Visit Note   Patient: Robin [name]           Date of Birth: [birth year]           MRN: [id number] Visit Date: 01/12/2024 PCP: Estil Heman, NP   Assessment & Plan:  Chief Complaint:  Chief Complaint  Patient presents with   Left Wrist - Follow-up    Left carpal tunnel release 12/14/2023   Visit Diagnoses:  1. Left carpal tunnel syndrome   2. S/P carpal tunnel release     Plan: patient comes in 4 weeks out from left carpal tunnel release.  She notes a little soreness, but no other complaints such as paresthesias or pain.  Left hand exam: fully healed surgical scar without complication.  Neurovascularly intact distally.  At this point, she will continue to rest activity as tolerated.  She may use lotion to desensitize the area.  Follow-up as needed.  Follow-Up Instructions: Return if symptoms worsen or fail to improve.   Orders:  No orders of the defined types were placed in this encounter.  No orders of the defined types were placed in this encounter.   Imaging: No new imaging  PMFS History: Patient Active Problem List   Diagnosis Date Noted   Partial nontraumatic tear of right rotator cuff 12/22/2022   Left hamstring injury 07/15/2022   Labral tear of shoulder, degenerative, right 06/21/2022   Left carpal tunnel syndrome 05/26/2022   Foot sprain, left, initial encounter 05/19/2022   Baker's cyst of knee, left 04/21/2022   Right carpal tunnel syndrome 03/16/2022   Flushing 04/14/2021   Hypermobility syndrome 11/27/2020   Seasonal and perennial allergic rhinitis 11/06/2018   Mild intermittent asthma without complication 11/06/2018   Flexural atopic dermatitis 11/06/2018   History of hyperlipidemia 11/08/2016   History of anxiety 11/08/2016   History of retinal tear 11/08/2016   History of Bilateral plantar fasciitis 08/10/2016   High risk medication use 07/04/2016   Lupus (HCC) 05/23/2016   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HCC) 03/18/2016   ADHD (attention  deficit hyperactivity disorder) 12/22/2014   Raynaud's phenomenon 10/17/2013   Hyperlipidemia 09/28/2011   Past Medical History:  Diagnosis Date   ADD (attention deficit disorder)    Allergy     Anemia    Anorexia nervosa with bulimia    Per [facility] New Patient Packet   Anxiety    Asthma    Carpal tunnel syndrome    Per [facility] New Patient Packet   Depression    Dry eyes    Per [facility] New Patient Packet   Dry mouth    Per [facility] New Patient Packet   Fibroids    Per [facility] New Patient Packet   History of hip x-ray 2021   Per [facility] New Patient Packet   History of Papanicolaou smear of cervix 2021   Per [facility] New Patient Packet, Dr.Pickens   Hyperlipidemia    Ileitis    Lupus    PONV (postoperative nausea and vomiting)    mild nausea    Raynaud disease    Retinal tear    Per [facility] New Patient Packet   Systemic lupus erythematosus (HCC)     Family History  Problem Relation Age of Onset   Heart disease Mother 25       CAD/stenting   Hyperlipidemia Mother    Arthritis Mother        ostearthritis   Hypertension Mother    Kidney disease Mother    Irritable bowel syndrome Mother  Stroke Mother 36       CVA   Fibromyalgia Mother    Eczema Mother    Sjogren's syndrome Mother    Hyperlipidemia Father    Neuropathy Father    Allergies Father        shellfish   Prostatitis Father    Hyperlipidemia Sister    ADD / ADHD Sister    Bipolar disorder Sister    Depression Sister    Hypertension Brother    OCD Brother    Anxiety disorder Daughter    ADD / ADHD Daughter    Asthma Daughter    OCD Son    Colon cancer Neg Hx    Inflammatory bowel disease Neg Hx    Allergic rhinitis Neg Hx    Angioedema Neg Hx    Atopy Neg Hx    Immunodeficiency Neg Hx    Urticaria Neg Hx     Past Surgical History:  Procedure Laterality Date   ABLATION  2022   CARPAL TUNNEL RELEASE Right    08/2019   CESAREAN SECTION  2003   CESAREAN SECTION  2005   Per [facility] New Patient Packet   COLONOSCOPY  2017    Dr.Jacobs   cyst drained Left 2023   knee   DENTAL SURGERY  2009   Fromed tooth and chronic infection   DILITATION & CURRETTAGE/HYSTROSCOPY WITH NOVASURE ABLATION N/A 06/03/2020   Procedure: DILATATION & CURETTAGE/HYSTEROSCOPY WITH NOVASURE ABLATION;  Surgeon: Raynell Caller, MD;  Location: [facility];  Service: Gynecology;  Laterality: N/A;   ENDOMETRIAL BIOPSY  04/20/2020       EYE SURGERY Bilateral    cryoretinopexy   hang nail procedure Left 2024   great toe   Social History   Occupational History   Not on file  Tobacco Use   Smoking status: Former    Current packs/day: 0.00    Average packs/day: 0.8 packs/day for 10.0 years (7.5 ttl pk-yrs)    Types: Cigarettes    Start date: 07/06/1991    Quit date: 07/05/2001    Years since quitting: 22.5    Passive exposure: Never   Smokeless tobacco: Never  Vaping Use   Vaping status: Never Used  Substance and Sexual Activity   Alcohol use: Yes    Comment: occ   Drug use: Never   Sexual activity: Yes    Birth control/protection: Condom

## 2024-01-19 DIAGNOSIS — J069 Acute upper respiratory infection, unspecified: Secondary | ICD-10-CM | POA: Diagnosis not present

## 2024-01-24 DIAGNOSIS — F411 Generalized anxiety disorder: Secondary | ICD-10-CM | POA: Diagnosis not present

## 2024-01-24 DIAGNOSIS — F902 Attention-deficit hyperactivity disorder, combined type: Secondary | ICD-10-CM | POA: Diagnosis not present

## 2024-01-24 DIAGNOSIS — F339 Major depressive disorder, recurrent, unspecified: Secondary | ICD-10-CM | POA: Diagnosis not present

## 2024-01-25 HISTORY — PX: DENTAL SURGERY: SHX609

## 2024-02-06 ENCOUNTER — Other Ambulatory Visit: Payer: Self-pay | Admitting: Allergy & Immunology

## 2024-02-06 ENCOUNTER — Encounter: Payer: Self-pay | Admitting: Allergy & Immunology

## 2024-02-06 ENCOUNTER — Other Ambulatory Visit: Payer: Self-pay

## 2024-02-06 ENCOUNTER — Ambulatory Visit: Admitting: Allergy & Immunology

## 2024-02-06 VITALS — BP 120/70 | HR 78 | Temp 97.6°F | Resp 19 | Ht 65.35 in | Wt 168.6 lb

## 2024-02-06 DIAGNOSIS — G901 Familial dysautonomia [Riley-Day]: Secondary | ICD-10-CM

## 2024-02-06 DIAGNOSIS — D8940 Mast cell activation, unspecified: Secondary | ICD-10-CM

## 2024-02-06 DIAGNOSIS — J302 Other seasonal allergic rhinitis: Secondary | ICD-10-CM | POA: Diagnosis not present

## 2024-02-06 DIAGNOSIS — L2089 Other atopic dermatitis: Secondary | ICD-10-CM

## 2024-02-06 DIAGNOSIS — J3089 Other allergic rhinitis: Secondary | ICD-10-CM | POA: Diagnosis not present

## 2024-02-06 DIAGNOSIS — J4520 Mild intermittent asthma, uncomplicated: Secondary | ICD-10-CM

## 2024-02-06 MED ORDER — MONTELUKAST SODIUM 10 MG PO TABS: 10.0000 mg | ORAL_TABLET | Freq: Two times a day (BID) | ORAL | 3 refills | Status: AC

## 2024-02-06 MED ORDER — CROMOLYN SODIUM 100 MG/5ML PO CONC: 100.0000 mg | Freq: Three times a day (TID) | ORAL | 5 refills | Status: AC

## 2024-02-06 MED ORDER — FAMOTIDINE 40 MG PO TABS: 40.0000 mg | ORAL_TABLET | Freq: Every day | ORAL | 3 refills | Status: AC

## 2024-02-06 NOTE — Progress Notes (Unsigned)
 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 Alvira Josephs  Plan/Recommendations:   1. Mild intermittent asthma, uncomplicated - Lung testing not done today since you have been so stable.  - We will not make any medication changes today. - Continue with albuterol  every 4 hours as needed.    2. Seasonal and perennial allergic rhinitis (grasses, indoor molds, outdoor molds, dust mites, cat and cockroach) - Continue with an over the counter antihistamine daily.  - Continue with Singulair  twice daily as you are doing.  - Continue with Astelin  as needed.    3. Flexural atopic dermatitis - Continue with moisturizing twice daily as needed. - Continue with Eucrisa as needed.   4. 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  - Continue with loratadine 20mg  TWICE DAILY.  - Continue with diphenhydramine  50mg  daily. - Continue with famotidine  40mg  nightly.  - Start cromolyn  5 mL up to four times days.   5. Concern for POTS - My patient this morning liked Dr. Karyl Paget Croitoru.  - Check him out and see what you think. - Many of my local POTS patients also like Dr. Richardo Chandler.  6. Return in about 1 year (around 02/05/2025).    Subjective:   Robin [name] is a 53 y.o. female presenting today for follow up of  Chief Complaint  Patient presents with   Allergies    Thrush on tongue   Establish Care   Nasal Congestion    Eyes and throat itching     Robin [name] has a history of the following: Patient Active Problem List    Diagnosis Date Noted   Partial nontraumatic tear of right rotator cuff 12/22/2022   Left hamstring injury 07/15/2022   Labral tear of shoulder, degenerative, right 06/21/2022   Left carpal tunnel syndrome 05/26/2022   Foot sprain, left, initial encounter 05/19/2022   Baker's cyst of knee, left 04/21/2022   Right carpal tunnel syndrome 03/16/2022   Flushing 04/14/2021   Hypermobility syndrome 11/27/2020   Seasonal and perennial allergic rhinitis 11/06/2018   Mild intermittent asthma without complication 11/06/2018   Flexural atopic dermatitis 11/06/2018   History of hyperlipidemia 11/08/2016   History of anxiety 11/08/2016   History of retinal tear 11/08/2016   History of Bilateral plantar fasciitis 08/10/2016   High risk medication use 07/04/2016   Lupus (HCC) 05/23/2016   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HCC) 03/18/2016   ADHD (attention deficit hyperactivity disorder) 12/22/2014   Raynaud's phenomenon 10/17/2013   Hyperlipidemia 09/28/2011    History obtained from: chart review and patient.  Discussed the use of AI scribe software for clinical note transcription with the patient and/or guardian, who gave verbal consent to proceed.  Robin [name] is a 53 y.o. female presenting for a follow up visit.  She was last seen in January 2024.  At that time, lung testing looked awesome.  We continue with albuterol  as needed.  For her allergic rhinitis, we continue with Singulair  twice daily as well as Astelin  as needed.  Atopic dermatitis was controlled with  Eucrisa and moisturizing twice daily as needed.  For her MCAS, we continue with naltrexone 6 mg daily as well as loratadine 20 mg daily, diphenhydramine  50 mg daily, famotidine  40 mg nightly, and cromolyn .  Since last visit, she has done fairly well until she ran out of her Singulair .  Over the past few weeks, there has been a significant worsening of their allergies. Previously, they managed symptoms effectively with Singulair  taken twice daily, but they  have not been using it recently. Currently, they experience severe sinus issues described as a 'histamine crash,' with achiness and soreness in the evenings.  These symptoms have also affected their bladder, causing pain and hematuria, although these symptoms improved by the next morning. They are currently taking two Benadryl  at night and alternating between Claritin and Allegra, with better results from Claritin, which they take 10 mg twice daily.  They are also on naltrexone 6 mg, which they have been taking for a long time, reporting significant improvement in symptoms after about nine months. They have a history of dysautonomia, syncope, and fainting, with previous inconclusive tilt table testing.  Some of her current healthcare team are retiring, including her POTS doctor in [place] .  They attempted cromolyn  in the past but experienced rashes. They are interested in trying cromolyn  again now that they are less reactive to alcohol after being on naltrexone. Additionally, they take famotidine  at 40 mg once a day. No current symptoms of infection.  Otherwise, there have been no changes to her past medical history, surgical history, family history, or social history.    Review of systems otherwise negative other than that mentioned in the HPI.    Objective:   Blood pressure 120/70, pulse 78, temperature 97.6 F (36.4 C), temperature source Temporal, resp. rate 19, height 5' 5.35" (1.66 m), weight 168 lb 9.6 oz (76.5 kg), SpO2 98%. Body mass index is 27.75 kg/m.    Physical Exam Vitals reviewed.  Constitutional:      Appearance: She is well-developed.     Comments: Very friendly. Talkative.   HENT:     Head: Normocephalic and atraumatic.     Right Ear: Tympanic membrane, ear canal and external ear normal.     Left Ear: Tympanic membrane, ear canal and external ear normal.     Nose: No nasal deformity, septal deviation, mucosal edema or rhinorrhea.     Right  Turbinates: Enlarged, swollen and pale.     Left Turbinates: Enlarged, swollen and pale.     Right Sinus: No maxillary sinus tenderness or frontal sinus tenderness.     Left Sinus: No maxillary sinus tenderness or frontal sinus tenderness.     Comments: No polyps.    Mouth/Throat:     Lips: Pink.     Mouth: Mucous membranes are moist. Mucous membranes are not pale and not dry.     Pharynx: Uvula midline.  Eyes:     General: Allergic shiner present.        Right eye: No discharge.        Left eye: No discharge.     Conjunctiva/sclera: Conjunctivae normal.     Right eye: Right conjunctiva is not injected. No chemosis.    Left eye: Left conjunctiva is not injected. No chemosis.    Pupils: Pupils are equal, round, and reactive to light.  Cardiovascular:     Rate and Rhythm: Normal rate and regular rhythm.     Heart sounds: Normal heart sounds.  Pulmonary:  Effort: Pulmonary effort is normal. No tachypnea, accessory muscle usage or respiratory distress.     Breath sounds: Normal breath sounds. No wheezing, rhonchi or rales.     Comments: Moving air well in all lung fields. Chest:     Chest wall: No tenderness.  Lymphadenopathy:     Cervical: No cervical adenopathy.  Skin:    General: Skin is warm.     Capillary Refill: Capillary refill takes less than 2 seconds.     Coloration: Skin is not pale.     Findings: No abrasion, erythema, petechiae or rash. Rash is not macular, papular, urticarial or vesicular.     Comments: Skin looks good today.   Neurological:     Mental Status: She is alert.  Psychiatric:        Behavior: Behavior is cooperative.      Diagnostic studies: none       Drexel Gentles, MD  [facility] 

## 2024-02-06 NOTE — Patient Instructions (Addendum)
 1. Mild intermittent asthma, uncomplicated - Lung testing not done today since you have been so stable.  - We will not make any medication changes today. - Continue with albuterol  every 4 hours as needed.    2. Seasonal and perennial allergic rhinitis (grasses, indoor molds, outdoor molds, dust mites, cat and cockroach) - Continue with an over the counter antihistamine daily.  - Continue with Singulair  twice daily as you are doing.  - Continue with Astelin  as needed.    3. Flexural atopic dermatitis - Continue with moisturizing twice daily as needed. - Continue with Eucrisa as needed.   4. 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  - Continue with loratadine 20mg  TWICE DAILY.  - Continue with diphenhydramine  50mg  daily. - Continue with famotidine  40mg  nightly.  - Start cromolyn  5 mL up to four times days.   5. Concern for POTS - My patient this morning liked Dr. Karyl Paget Croitoru.  - Check him out and see what you think. - Many of my local POTS patients also like Dr. Read Camel.  6. Return in about 1 year (around 02/05/2025).    Please inform us  of any Emergency Department visits, hospitalizations, or changes in symptoms. Call us  before going to the ED for breathing or allergy  symptoms since we might be able to fit you in for a sick visit. Feel free to contact us  anytime with any questions, problems, or concerns.  It was a pleasure to see you and your family again today!  Websites that have reliable patient information: 1. American Academy of Asthma, Allergy , and Immunology: www.aaaai.org 2. Food Allergy  Research and Education (FARE): foodallergy.org 3. Mothers of Asthmatics: http://www.asthmacommunitynetwork.org 4. American College of Allergy , Asthma, and Immunology: www.acaai.org   COVID-19 Vaccine Information can be found at:  PodExchange.nl For questions related to vaccine distribution or appointments, please email vaccine@[email or web address] or call [phone].     "Like" us  on Facebook and Instagram for our latest updates!       Make sure you are registered to vote! If you have moved or changed any of your contact information, you will need to get this updated before voting!  In some cases, you MAY be able to register to vote online: [email or web address]

## 2024-02-07 ENCOUNTER — Telehealth: Payer: Self-pay

## 2024-02-07 NOTE — Telephone Encounter (Signed)
*  Asthma/Allergy   Pharmacy Patient Advocate Encounter   Received notification from CoverMyMeds that prior authorization for Famotidine  40MG  tablets  is required/requested.   Insurance verification completed.   The patient is insured through [facility] .   Per test claim: PA required; PA submitted to above mentioned insurance via CoverMyMeds Key/confirmation #/EOC [facility] Status is pending

## 2024-02-08 ENCOUNTER — Telehealth: Payer: Self-pay | Admitting: Pharmacist

## 2024-02-08 ENCOUNTER — Other Ambulatory Visit (HOSPITAL_COMMUNITY): Payer: Self-pay

## 2024-02-08 NOTE — Telephone Encounter (Signed)
 Sending to Appeals Rph to take a look since patient is not using this for obesity.

## 2024-02-08 NOTE — Telephone Encounter (Signed)
 Denied on May 28 by [facility] 2017 Denied. This health benefit plan does not cover the following services, supplies, drugs or charges: Any treatment or regimen, medical or surgical, for the purpose of reducing or controlling the weight of the member, or for the treatment of obesity, except for surgical treatment of morbid obesity, or as specifically covered by this health benefit plan.

## 2024-02-08 NOTE — Telephone Encounter (Signed)
 An E-Appeal has been submitted for famotidine . Will advise when response is received, please be advised that most companies may take 30 days to make a decision. Appeal letter and supporting documentation have been uploaded and submitted via CMM on 02/08/2024 @4  pm.  Thank you, Dene Fines, PharmD Clinical Pharmacist  [place]  Direct Dial: [phone]

## 2024-02-08 NOTE — Telephone Encounter (Signed)
 Spoke with pharmacy states pt has already picked up medication.

## 2024-02-09 ENCOUNTER — Other Ambulatory Visit: Payer: Self-pay | Admitting: Allergy & Immunology

## 2024-02-09 NOTE — Telephone Encounter (Signed)
 Refill for azelastine  x 1 with 5 refills sent to [facility] pharmacy.

## 2024-02-14 ENCOUNTER — Other Ambulatory Visit: Payer: Self-pay | Admitting: Physician Assistant

## 2024-02-14 DIAGNOSIS — M3219 Other organ or system involvement in systemic lupus erythematosus: Secondary | ICD-10-CM

## 2024-02-14 NOTE — Telephone Encounter (Signed)
 Last Fill: 11/01/2023  Eye exam: 12/20/2023 WNL   Labs: 11/01/2023 CBC and CMP normal.    Next Visit: 04/09/2024  Last Visit: 11/01/2023  [id number] systemic lupus erythematosus with other organ involvement   Current Dose per office note 11/01/2023: Plaquenil  200 mg 1 tablet by mouth twice daily   Okay to refill Plaquenil ?

## 2024-02-19 ENCOUNTER — Encounter: Payer: Self-pay | Admitting: Allergy & Immunology

## 2024-02-19 NOTE — Telephone Encounter (Signed)
Appeal denied by insurance.

## 2024-02-22 DIAGNOSIS — J329 Chronic sinusitis, unspecified: Secondary | ICD-10-CM | POA: Diagnosis not present

## 2024-02-22 DIAGNOSIS — J342 Deviated nasal septum: Secondary | ICD-10-CM | POA: Diagnosis not present

## 2024-02-22 DIAGNOSIS — J301 Allergic rhinitis due to pollen: Secondary | ICD-10-CM | POA: Diagnosis not present

## 2024-02-28 ENCOUNTER — Other Ambulatory Visit: Payer: Self-pay

## 2024-02-28 MED ORDER — UNABLE TO FIND: 6.0000 mg | Freq: Every day | 2 refills | Status: AC

## 2024-02-28 NOTE — Addendum Note (Signed)
 Addended by: Denton Flakes on: 02/28/2024 05:38 PM   Modules accepted: Orders

## 2024-02-29 MED ORDER — NALTREXONE POWD: 6.0000 mg | Freq: Every day | 2 refills | Status: AC

## 2024-02-29 NOTE — Telephone Encounter (Signed)
 Patient prescription for Naltrexone 6 mg has been fax to the following:  [facility] [address] [place], [place] [address]  Ofc: [phone] Fax: [phone]  I did call the pharmacy - spoke to [place], Pharmacist - DOB verified - verified/confirmed correct information was put on script.

## 2024-03-22 DIAGNOSIS — S83282A Other tear of lateral meniscus, current injury, left knee, initial encounter: Secondary | ICD-10-CM | POA: Diagnosis not present

## 2024-03-26 NOTE — Progress Notes (Deleted)
 Office Visit Note  Patient: Robin [name]             Date of Birth: July 30, 1971           MRN: [id number]             PCP: Leonarda Roxan BROCKS, NP Referring: Leonarda Roxan BROCKS, NP Visit Date: 04/09/2024 Occupation: @GUAROCC @  Subjective:  No chief complaint on file.   History of Present Illness: Robin [name] is a 53 y.o. female ***     Activities of Daily Living:  Patient reports morning stiffness for *** {minute/hour:19697}.   Patient {ACTIONS;DENIES/REPORTS:21021675::Denies} nocturnal pain.  Difficulty dressing/grooming: {ACTIONS;DENIES/REPORTS:21021675::Denies} Difficulty climbing stairs: {ACTIONS;DENIES/REPORTS:21021675::Denies} Difficulty getting out of chair: {ACTIONS;DENIES/REPORTS:21021675::Denies} Difficulty using hands for taps, buttons, cutlery, and/or writing: {ACTIONS;DENIES/REPORTS:21021675::Denies}  No Rheumatology ROS completed.   PMFS History:  Patient Active Problem List   Diagnosis Date Noted   Partial nontraumatic tear of right rotator cuff 12/22/2022   Left hamstring injury 07/15/2022   Labral tear of shoulder, degenerative, right 06/21/2022   Left carpal tunnel syndrome 05/26/2022   Foot sprain, left, initial encounter 05/19/2022   Baker's cyst of knee, left 04/21/2022   Right carpal tunnel syndrome 03/16/2022   Flushing 04/14/2021   Hypermobility syndrome 11/27/2020   Seasonal and perennial allergic rhinitis 11/06/2018   Mild intermittent asthma without complication 11/06/2018   Flexural atopic dermatitis 11/06/2018   History of hyperlipidemia 11/08/2016   History of anxiety 11/08/2016   History of retinal tear 11/08/2016   History of Bilateral plantar fasciitis 08/10/2016   High risk medication use 07/04/2016   Lupus (HCC) 05/23/2016   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HCC) 03/18/2016   ADHD (attention deficit hyperactivity disorder) 12/22/2014   Raynaud's phenomenon 10/17/2013   Hyperlipidemia 09/28/2011    Past Medical History:   Diagnosis Date   ADD (attention deficit disorder)    Allergy     Anemia    Anorexia nervosa with bulimia    Per [facility] New Patient Packet   Anxiety    Asthma    Carpal tunnel syndrome    Per [facility] New Patient Packet   Depression    Dry eyes    Per [facility] New Patient Packet   Dry mouth    Per [facility] New Patient Packet   Fibroids    Per [facility] New Patient Packet   History of hip x-ray 2021   Per [facility] New Patient Packet   History of Papanicolaou smear of cervix 2021   Per [facility] New Patient Packet, Dr.Pickens   Hyperlipidemia    Ileitis    Lupus    PONV (postoperative nausea and vomiting)    mild nausea    Raynaud disease    Retinal tear    Per [facility] New Patient Packet   Systemic lupus erythematosus (HCC)     Family History  Problem Relation Age of Onset   Heart disease Mother 35       CAD/stenting   Hyperlipidemia Mother    Arthritis Mother        ostearthritis   Hypertension Mother    Kidney disease Mother    Irritable bowel syndrome Mother    Stroke Mother 50       CVA   Fibromyalgia Mother    Eczema Mother    Sjogren's syndrome Mother    Hyperlipidemia Father    Neuropathy Father    Allergies Father        shellfish   Prostatitis Father    Hyperlipidemia Sister  ADD / ADHD Sister    Bipolar disorder Sister    Depression Sister    Hypertension Brother    OCD Brother    Anxiety disorder Daughter    ADD / ADHD Daughter    Asthma Daughter    OCD Son    Colon cancer Neg Hx    Inflammatory bowel disease Neg Hx    Allergic rhinitis Neg Hx    Angioedema Neg Hx    Atopy Neg Hx    Immunodeficiency Neg Hx    Urticaria Neg Hx    Past Surgical History:  Procedure Laterality Date   ABLATION  2022   CARPAL TUNNEL RELEASE Right    08/2019   CESAREAN SECTION  2003   CESAREAN SECTION  2005   Per [facility] New Patient Packet   COLONOSCOPY  2017   Dr.Jacobs   cyst drained Left 2023   knee   DENTAL SURGERY  2009   Fromed tooth and chronic infection   DILITATION &  CURRETTAGE/HYSTROSCOPY WITH NOVASURE ABLATION N/A 06/03/2020   Procedure: DILATATION & CURETTAGE/HYSTEROSCOPY WITH NOVASURE ABLATION;  Surgeon: Izell Harari, MD;  Location:  [facility];  Service: Gynecology;  Laterality: N/A;   ENDOMETRIAL BIOPSY  04/20/2020       EYE SURGERY Bilateral    cryoretinopexy   hang nail procedure Left 2024   great toe   Social History   Social History Narrative   Marital status: married x 17 years; happily married      Children: 2 children (15, 35)      Lives: with husband, 2 children.      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 4 (including me)       Pets: 1 dog, 2 cats       Highest level or education completed: Undergraduate degree       Current/Past profession: Gaffer       Exercise:        Yes          Type and how often: 30-60 min walking 5 days a week. Pilate's 1 x a week, and horseback riding 2 times a week          Living Will: No   DNR: no, would like to discuss one    POA/HPOA: No      Functional Status:   Do you have difficulty bathing or dressing yourself? Left blank    Do you have difficulty preparing food or eating? Left blank   Do you have difficulty managing your medications? Left blank   Do you have difficulty managing your finances? Left blank   Do you have difficulty affording your medications? Left blank   Immunization History  Administered Date(s) Administered   Influenza,inj,Quad PF,6+ Mos 05/12/2014,  11/27/2015, 05/23/2016, 10/30/2017, 07/30/2018   Influenza-Unspecified 07/08/2022, 07/10/2023   Moderna Sars-Covid-2 Vaccination 10/27/2020, 10/31/2020   PFIZER(Purple Top)SARS-COV-2 Vaccination 12/06/2019, 12/16/2019, 05/27/2020, 07/07/2022   Pfizer(Comirnaty)Fall Seasonal Vaccine 12 years and older 07/10/2023   Pneumococcal Conjugate-13 05/23/2016   Tdap 09/28/2011, 08/12/2020     Objective: Vital Signs: There were no vitals taken for this visit.   Physical Exam   Musculoskeletal Exam: ***  CDAI Exam: CDAI Score: -- Patient Global: --; Provider Global: -- Swollen: --; Tender: -- Joint Exam 04/09/2024   No joint exam has been documented for this visit   There is currently no information documented on the homunculus. Go to the Rheumatology activity and complete the homunculus joint exam.  Investigation: No additional findings.  Imaging: No results found.  Recent Labs: Lab Results  Component Value Date   WBC 4.3 11/01/2023   HGB 13.7 11/01/2023   PLT 289 11/01/2023   NA 139 11/01/2023   K 4.5 11/01/2023   CL 101 11/01/2023   CO2 34 (H) 11/01/2023   GLUCOSE 78 11/01/2023   BUN 16 11/01/2023   CREATININE 0.81 11/01/2023   BILITOT 0.5 11/01/2023   ALKPHOS 26 (L) 06/01/2020   AST 20 11/01/2023   ALT 16 11/01/2023   PROT 7.3 11/01/2023   ALBUMIN 3.8 06/01/2020   CALCIUM 10.1 11/01/2023   GFRAA 93 01/26/2021    Speciality Comments: PLQ Eye Exam: 12/20/2023 WNL @ [facility] (in [place]) Follow up in 1 year.   ANA 1:320 speckled, history of oral ulcers, lymphadenopathy, photosensitivity, Raynauds, erythema nodosum, inflammatory arthritis.  Procedures:  No procedures performed Allergies: Cromolyn  sodium, Avelox [moxifloxacin hcl in nacl], Cefaclor, Dust mite extract, Latex, Mold extract [trichophyton], Moxifloxacin, Red wine complex [germanium], Meloxicam , and Naproxen    Assessment / Plan:     Visit Diagnoses: Other systemic lupus  erythematosus with other organ involvement (HCC)  High risk medication use  Raynaud's phenomenon without gangrene  Erythema nodosum  Chronic right shoulder pain  Primary osteoarthritis of both hands  Primary osteoarthritis of both feet  Hypermobility of joint  Other fatigue  S/P carpal tunnel release  Chronic left SI joint pain  Vitamin D  deficiency  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  No orders of the defined types were placed in this encounter.   Face-to-face time spent with patient was *** minutes. Greater than 50% of time was spent in counseling and coordination of care.  Follow-Up Instructions: No follow-ups on file.   Waddell CHRISTELLA Craze, PA-C  Note - This record has been created using Dragon software.  Chart creation errors have been sought, but may not always  have been located. Such creation errors do not reflect on  the standard of medical care.

## 2024-04-01 ENCOUNTER — Other Ambulatory Visit (INDEPENDENT_AMBULATORY_CARE_PROVIDER_SITE_OTHER): Payer: Self-pay

## 2024-04-01 ENCOUNTER — Ambulatory Visit: Admitting: Sports Medicine

## 2024-04-01 ENCOUNTER — Other Ambulatory Visit: Payer: Self-pay

## 2024-04-01 ENCOUNTER — Encounter: Payer: Self-pay | Admitting: Sports Medicine

## 2024-04-01 DIAGNOSIS — M25462 Effusion, left knee: Secondary | ICD-10-CM | POA: Diagnosis not present

## 2024-04-01 DIAGNOSIS — M67441 Ganglion, right hand: Secondary | ICD-10-CM | POA: Diagnosis not present

## 2024-04-01 DIAGNOSIS — S83207D Unspecified tear of unspecified meniscus, current injury, left knee, subsequent encounter: Secondary | ICD-10-CM

## 2024-04-01 DIAGNOSIS — M25562 Pain in left knee: Secondary | ICD-10-CM | POA: Diagnosis not present

## 2024-04-01 DIAGNOSIS — M79641 Pain in right hand: Secondary | ICD-10-CM | POA: Diagnosis not present

## 2024-04-01 DIAGNOSIS — G8929 Other chronic pain: Secondary | ICD-10-CM

## 2024-04-01 MED ORDER — METHYLPREDNISOLONE ACETATE 40 MG/ML IJ SUSP
40.0000 mg | INTRAMUSCULAR | Status: AC | PRN
  Administered 2024-04-01: 40 mg via INTRA_ARTICULAR

## 2024-04-01 MED ORDER — LIDOCAINE HCL 1 % IJ SOLN
4.0000 mL | INTRAMUSCULAR | Status: AC | PRN
  Administered 2024-04-01: 4 mL

## 2024-04-01 MED ORDER — BUPIVACAINE HCL 0.25 % IJ SOLN
2.0000 mL | INTRAMUSCULAR | Status: AC | PRN
  Administered 2024-04-01: 2 mL via INTRA_ARTICULAR

## 2024-04-01 NOTE — Progress Notes (Signed)
 Robin [name] - 53 y.o. female MRN [id number]  Date of birth: [birth year]  Office Visit Note: Visit Date: 04/01/2024 PCP: Leonarda Roxan BROCKS, NP Referred by: Leonarda Roxan BROCKS, NP  Subjective: Chief Complaint  Patient presents with   Left Knee - Pain   Right Hand - Pain   HPI: Robin [name] is a pleasant 53 y.o. female who presents today for acute on chronic left knee pain with swelling after twisting injury weeks ago.  Also with chronic dorsal right hand pain and swelling times months.  Right hand -since about March she has had some pain and swelling at the dorsum of the hand near the middle metacarpal region.  The swelling will fluctuate and come and go with time.  She has pain with range of motion specifically holding items as well as extension of the wrist and hand.  She has pain with pushing herself up from the hand/wrist.  No specific injury that she is aware of.  Left knee -a few weeks ago she accidentally twisted her ankle and tweaked the left knee.  Days following this she has had pain that is localized to the lateral aspect of the knee.  She does report clicking in the knee that she did not have previously to her injury.  She does notice swelling in the knee that is limiting her range of motion.  She has had some relief with ibuprofen  and heat but her pain and swelling still remains.  Was seen at outside orthopedic office, [facility] on 03/22/2024.  Note was reviewed today.  They were concern for possible lateral meniscus injury of the left knee.  Pertinent ROS were reviewed with the patient and found to be negative unless otherwise specified above in HPI.   Assessment & Plan: Visit Diagnoses:  1. Chronic pain of left knee   2. Effusion, left knee   3. Positive McMurray test of left knee, subsequent encounter   4. Ganglion cyst of tendon sheath of right hand   5. Pain in right hand    Plan: Impression is acute on chronic left knee pain with associated effusion  after twisting injury a few weeks ago.  Discussed with [name] that her exam, symptomatology and the fact that she had a clear-bloody fluid aspiration points to this likely being a lateral meniscus tear.  Through shared decision making, we did proceed with ultrasound-guided aspiration and subsequent injection, patient tolerated well.  Advised on icing 2-3 times daily, wearing a knee compression sleeve for the next 3-5 days with activity modification.  Starting this weekend, she may then return to walking and linear based exercise but would avoid hard impact activity and twisting.  Okay for over-the-counter anti-inflammatories as needed.  She is on naltrexone  powder 6 mg daily, she may continue this for pain control.  In terms of her pain and swelling on the dorsum of the right hand, x-rays do not show any significant arthritic or bony change.  Did perform bedside ultrasound that was not diagnostic which showed evidence of likely underlying ganglion cyst just deep to the extensor tendons of the wrist and hand, this does fit more of her diagnosis.  Recommended simple observation, may use hand compression glove at nighttime, icing and using over-the-counter anti-inflammatories as needed.  Given the location, do not feel needle aspiration would be warranted.  I would like to see her back in 1 month for both of these conditions, could consider further evaluation of the hand with diagnostic ultrasound or even  MRI versus evaluation with our hand surgeon, Dr. Erwin. She is agreeble to this plan.  Follow-up: Return in about 1 month (around 05/02/2024) for Left knee, R-dorsal hand  Meds & Orders: No orders of the defined types were placed in this encounter.   Orders Placed This Encounter  Procedures   XR Hand Complete Right   US  Guided Needle Placement - No Linked Charges     Procedures: Large Joint Inj: L knee on 04/01/2024 10:35 PM Indications: pain, joint swelling and diagnostic evaluation Details: 18 G 1.5 in  needle, ultrasound-guided superolateral approach Medications: 4 mL lidocaine  1 %; 2 mL bupivacaine  0.25 %; 40 mg methylPREDNISolone  acetate 40 MG/ML Aspirate: 27 mL clear and bloody Outcome: tolerated well, no immediate complications  US -guided Knee Aspiration, Left: After discussion on risks/benefits/indications was provided, informed verbal consent was obtained and a timeout was performed, patient was lying supine on exam table with knee bolster pillow in place. The knee was prepped with Chloraprep and multiple alcohol swabs . Utilizing superolateral approach, approximately 4 mL of lidocaine  1% was used for local anesthesia. Then using ultrasound guidance via a transverse-axis and in-plane approach, an 18g, 1.5 needle was inserted and 27 mL of clear bloody (slightly amber colored) fluid was aspirated from the knee joint. Utilizing the same portal and a sterile syringe swap, the knee joint was then  injected under ultrasound guidance with  2:1 bupivicaine:depomedrol with flow of the injectate visualized going into the knee joint.  Patient tolerated procedure well without immediate complications.  Procedure, treatment alternatives, risks and benefits explained, specific risks discussed. Consent was given by the patient. Immediately prior to procedure a time out was called to verify the correct patient, procedure, equipment, support staff and site/side marked as required. Patient was prepped and draped in the usual sterile fashion.          Clinical History: No specialty comments available.  She reports that she quit smoking about 22 years ago. Her smoking use included cigarettes. She started smoking about 32 years ago. She has a 7.5 pack-year smoking history. She has never been exposed to tobacco smoke. She has never used smokeless tobacco. No results for input(s): HGBA1C, LABURIC in the last 8760 hours.  Objective:    Physical Exam  Gen: Well-appearing, in no acute distress; non-toxic CV:  Well-perfused. Warm.  Resp: Breathing unlabored on room air; no wheezing. Psych: Fluid speech in conversation; appropriate affect; normal thought process  Ortho Exam - Left knee: There is a moderate effusion about the knee joint.  Positive TTP over the lateral joint line with reproducible clicking with McMurray's testing over the lateral side. Positive Thessaly's test over the lateral aspect of the knee.  - Right hand: Evaluation of the dorsum of the hand does show a mild prominence just inferior to the region of the third MCP.  This is somewhat firm and mobile.  There is no fluctuance noted.  No surrounding redness.  There is mild pain with palpation and resisted extension about the wrist extensor tendons.  Full close grip motion.  Imaging:  US  Guided Needle Placement - No Linked Charges Ultrasound-guided LEFT knee intra-articular aspiration and subsequent  injection.  This was incorrectly labeled *Right* on one of the images, but  the procedure was performed only on the left knee. XR Hand Complete Right 3 views of the right hand including AP, oblique and lateral film were  ordered and reviewed by myself today.  X-rays demonstrate no significant  arthritic change about the hand  or wrist.  There is a mild negative ulnar  variance.  The lateral film does show a small degree of soft tissue  swelling overlying the metacarpal region.  No calcification within the  soft tissue present.   *Reviewed from x-rays from [facility] on 03/22/2024 as below: 03/22/2024 03/22/2024 IMAGING: Radiographic imaging, including bilateral AP, lateral, and sunrise views of the left knee, was obtained and reviewed by me today. Findings reveal no acute fractures, normal joint spaces, and no significant knee effusion.    Past Medical/Family/Surgical/Social History: Medications & Allergies reviewed per EMR, new medications updated. Patient Active Problem List   Diagnosis Date Noted   Partial nontraumatic  tear of right rotator cuff 12/22/2022   Left hamstring injury 07/15/2022   Labral tear of shoulder, degenerative, right 06/21/2022   Left carpal tunnel syndrome 05/26/2022   Foot sprain, left, initial encounter 05/19/2022   Baker's cyst of knee, left 04/21/2022   Right carpal tunnel syndrome 03/16/2022   Flushing 04/14/2021   Hypermobility syndrome 11/27/2020   Seasonal and perennial allergic rhinitis 11/06/2018   Mild intermittent asthma without complication 11/06/2018   Flexural atopic dermatitis 11/06/2018   History of hyperlipidemia 11/08/2016   History of anxiety 11/08/2016   History of retinal tear 11/08/2016   History of Bilateral plantar fasciitis 08/10/2016   High risk medication use 07/04/2016   Lupus (HCC) 05/23/2016   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HCC) 03/18/2016   ADHD (attention deficit hyperactivity disorder) 12/22/2014   Raynaud's phenomenon 10/17/2013   Hyperlipidemia 09/28/2011   Past Medical History:  Diagnosis Date   ADD (attention deficit disorder)    Allergy     Anemia    Anorexia nervosa with bulimia    Per [facility] New Patient Packet   Anxiety    Asthma    Carpal tunnel syndrome    Per [facility] New Patient Packet   Depression    Dry eyes    Per [facility] New Patient Packet   Dry mouth    Per [facility] New Patient Packet   Fibroids    Per [facility] New Patient Packet   History of hip x-ray 2021   Per [facility] New Patient Packet   History of Papanicolaou smear of cervix 2021   Per [facility] New Patient Packet, Dr.Pickens   Hyperlipidemia    Ileitis    Lupus    PONV (postoperative nausea and vomiting)    mild nausea    Raynaud disease    Retinal tear    Per [facility] New Patient Packet   Systemic lupus erythematosus (HCC)    Family History  Problem Relation Age of Onset   Heart disease Mother 68       CAD/stenting   Hyperlipidemia Mother    Arthritis Mother        ostearthritis   Hypertension Mother    Kidney disease Mother    Irritable bowel syndrome Mother    Stroke Mother 60       CVA    Fibromyalgia Mother    Eczema Mother    Sjogren's syndrome Mother    Hyperlipidemia Father    Neuropathy Father    Allergies Father        shellfish   Prostatitis Father    Hyperlipidemia Sister    ADD / ADHD Sister    Bipolar disorder Sister    Depression Sister    Hypertension Brother    OCD Brother    Anxiety disorder Daughter    ADD / ADHD Daughter    Asthma Daughter  OCD Son    Colon cancer Neg Hx    Inflammatory bowel disease Neg Hx    Allergic rhinitis Neg Hx    Angioedema Neg Hx    Atopy Neg Hx    Immunodeficiency Neg Hx    Urticaria Neg Hx    Past Surgical History:  Procedure Laterality Date   ABLATION  2022   CARPAL TUNNEL RELEASE Right    08/2019   CESAREAN SECTION  2003   CESAREAN SECTION  2005   Per [facility] New Patient Packet   COLONOSCOPY  2017   Dr.Jacobs   cyst drained Left 2023   knee   DENTAL SURGERY  2009   Fromed tooth and chronic infection   DILITATION & CURRETTAGE/HYSTROSCOPY WITH NOVASURE ABLATION N/A 06/03/2020   Procedure: DILATATION & CURETTAGE/HYSTEROSCOPY WITH NOVASURE ABLATION;  Surgeon: Izell Harari, MD;  Location: [facility];  Service: Gynecology;  Laterality: N/A;   ENDOMETRIAL BIOPSY  04/20/2020       EYE SURGERY Bilateral    cryoretinopexy   hang nail procedure Left 2024   great toe   Social History   Occupational History   Not on file  Tobacco Use   Smoking status: Former    Current packs/day: 0.00    Average packs/day: 0.8 packs/day for 10.0 years (7.5 ttl pk-yrs)    Types: Cigarettes    Start date: 07/06/1991    Quit date: 07/05/2001    Years since quitting: 22.7    Passive exposure: Never   Smokeless tobacco: Never  Vaping Use   Vaping status: Never Used  Substance and Sexual Activity   Alcohol use: Yes    Comment: occ   Drug use: Never   Sexual activity: Yes    Birth control/protection: Condom

## 2024-04-01 NOTE — Progress Notes (Signed)
 Patient says that she has had pain and swelling on the top of the hand since March. She says that both the swelling and the pain have gotten worse overtime. She has pain with wrist ROM and when holding things.  Patient says that a few weeks ago, she twisted her ankle and tweaked her knee. She has gotten some relief with Ibuprofen  and heat. She denies any pop at the time of injury, but has had clicking since then that she did not previously have. She says that heat does give her temporary relief. She has been wearing a brace, as well. Patient points over the lateral knee and lateral hamstring when describing her pain. She was told recently that she has a meniscus tear.

## 2024-04-08 DIAGNOSIS — F3340 Major depressive disorder, recurrent, in remission, unspecified: Secondary | ICD-10-CM | POA: Diagnosis not present

## 2024-04-08 DIAGNOSIS — F411 Generalized anxiety disorder: Secondary | ICD-10-CM | POA: Diagnosis not present

## 2024-04-08 DIAGNOSIS — F902 Attention-deficit hyperactivity disorder, combined type: Secondary | ICD-10-CM | POA: Diagnosis not present

## 2024-04-09 ENCOUNTER — Ambulatory Visit: Payer: BC Managed Care – PPO | Admitting: Rheumatology

## 2024-04-09 DIAGNOSIS — Z79899 Other long term (current) drug therapy: Secondary | ICD-10-CM

## 2024-04-09 DIAGNOSIS — Z8669 Personal history of other diseases of the nervous system and sense organs: Secondary | ICD-10-CM

## 2024-04-09 DIAGNOSIS — G8929 Other chronic pain: Secondary | ICD-10-CM

## 2024-04-09 DIAGNOSIS — F900 Attention-deficit hyperactivity disorder, predominantly inattentive type: Secondary | ICD-10-CM

## 2024-04-09 DIAGNOSIS — M19041 Primary osteoarthritis, right hand: Secondary | ICD-10-CM

## 2024-04-09 DIAGNOSIS — Z8639 Personal history of other endocrine, nutritional and metabolic disease: Secondary | ICD-10-CM

## 2024-04-09 DIAGNOSIS — Z9889 Other specified postprocedural states: Secondary | ICD-10-CM

## 2024-04-09 DIAGNOSIS — Z8659 Personal history of other mental and behavioral disorders: Secondary | ICD-10-CM

## 2024-04-09 DIAGNOSIS — E559 Vitamin D deficiency, unspecified: Secondary | ICD-10-CM

## 2024-04-09 DIAGNOSIS — R5383 Other fatigue: Secondary | ICD-10-CM

## 2024-04-09 DIAGNOSIS — M3219 Other organ or system involvement in systemic lupus erythematosus: Secondary | ICD-10-CM

## 2024-04-09 DIAGNOSIS — M19071 Primary osteoarthritis, right ankle and foot: Secondary | ICD-10-CM

## 2024-04-09 DIAGNOSIS — K5000 Crohn's disease of small intestine without complications: Secondary | ICD-10-CM

## 2024-04-09 DIAGNOSIS — I7300 Raynaud's syndrome without gangrene: Secondary | ICD-10-CM

## 2024-04-09 DIAGNOSIS — L52 Erythema nodosum: Secondary | ICD-10-CM

## 2024-04-09 DIAGNOSIS — M249 Joint derangement, unspecified: Secondary | ICD-10-CM

## 2024-04-10 NOTE — Progress Notes (Unsigned)
 Office Visit Note  Patient: Robin [name]             Date of Birth: [birth year]           MRN: [id number]             PCP: Leonarda Roxan BROCKS, NP Referring: Leonarda Roxan BROCKS, NP Visit Date: 04/11/2024 Occupation: @GUAROCC @  Subjective:  Medication monitoring    History of Present Illness: Robin [name] is a 53 y.o. female with history of systemic lupus.  Patient is taking Plaquenil  200 mg 1 tablet by mouth twice daily.  She is tolerating Plaquenil  without any side effects and has not had any recent gaps in therapy.  Patient reports that since her last office visit she had left carpal tunnel release performed by Dr. Jerri on 12/14/2023, which was successful.  Patient states she has been under the care of Dr. Burnetta for management of left knee pain.  According to the patient she has been rolling her ankles and ended up tearing her meniscus in the left knee.  Patient states that she had a knee joint aspiration and cortisone injection performed on 04/01/2024 which has been helpful at alleviating her symptoms.  She has been wearing a compression brace on a daily basis since then.  Patient is planning to continue to proceed with physical therapy and not surgical intervention at this time.  Patient states that she has a cyst on her right wrist and recently had an ultrasound performed.  Patient has been using compression and ice locally to help with the pain and stiffness.  If her symptoms do not improve she plans on having an MRI of the right wrist for further evaluation. Overall she feels that the systemic lupus is well-controlled on the current treatment regimen.  She has had less general inflammation.  She has had less frequent symptoms of Raynaud's phenomenon.  She continues occasional oral ulcers typically exacerbated by her stress levels.  She has ongoing photosensitivity so she tries to avoid direct sun exposure.  She denies any hair loss or current rashes.   Activities of Daily Living:  Patient  reports morning stiffness for 45 minutes.   Patient Denies nocturnal pain.  Difficulty dressing/grooming: Reports Difficulty climbing stairs: Denies Difficulty getting out of chair: Reports Difficulty using hands for taps, buttons, cutlery, and/or writing: Reports  Review of Systems  Constitutional:  Negative for fatigue.  HENT:  Positive for mouth sores and mouth dryness.   Eyes:  Positive for dryness.  Respiratory:  Negative for shortness of breath.   Cardiovascular:  Negative for chest pain and palpitations.  Gastrointestinal:  Positive for constipation. Negative for blood in stool and diarrhea.  Endocrine: Positive for increased urination.  Genitourinary:  Negative for involuntary urination.  Musculoskeletal:  Positive for joint pain, gait problem, joint pain, joint swelling, muscle weakness, morning stiffness and muscle tenderness. Negative for myalgias and myalgias.  Skin:  Positive for rash and sensitivity to sunlight. Negative for color change and hair loss.  Allergic/Immunologic: Negative for susceptible to infections.  Neurological:  Positive for dizziness and headaches.  Hematological:  Negative for swollen glands.  Psychiatric/Behavioral:  Positive for sleep disturbance. Negative for depressed mood. The patient is nervous/anxious.     PMFS History:  Patient Active Problem List   Diagnosis Date Noted   Partial nontraumatic tear of right rotator cuff 12/22/2022   Left hamstring injury 07/15/2022   Labral tear of shoulder, degenerative, right 06/21/2022   Left carpal tunnel syndrome  05/26/2022   Foot sprain, left, initial encounter 05/19/2022   Baker's cyst of knee, left 04/21/2022   Right carpal tunnel syndrome 03/16/2022   Flushing 04/14/2021   Hypermobility syndrome 11/27/2020   Seasonal and perennial allergic rhinitis 11/06/2018   Mild intermittent asthma without complication 11/06/2018   Flexural atopic dermatitis 11/06/2018   History of hyperlipidemia 11/08/2016    History of anxiety 11/08/2016   History of retinal tear 11/08/2016   History of Bilateral plantar fasciitis 08/10/2016   High risk medication use 07/04/2016   Lupus (HCC) 05/23/2016   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HCC) 03/18/2016   ADHD (attention deficit hyperactivity disorder) 12/22/2014   Raynaud's phenomenon 10/17/2013   Hyperlipidemia 09/28/2011    Past Medical History:  Diagnosis Date   ADD (attention deficit disorder)    Allergy     Anemia    Anorexia nervosa with bulimia    Per [facility] New Patient Packet   Anxiety    Asthma    Carpal tunnel syndrome    Per [facility] New Patient Packet   Depression    Dry eyes    Per [facility] New Patient Packet   Dry mouth    Per [facility] New Patient Packet   Fibroids    Per [facility] New Patient Packet   Ganglion cyst    right hand   History of hip x-ray 2021   Per [facility] New Patient Packet   History of Papanicolaou smear of cervix 2021   Per [facility] New Patient Packet, Dr.Pickens   Hyperlipidemia    Ileitis    Lupus    PONV (postoperative nausea and vomiting)    mild nausea    Raynaud disease    Retinal tear    Per [facility] New Patient Packet   Systemic lupus erythematosus (HCC)    Torn meniscus    left    Family History  Problem Relation Age of Onset   Heart disease Mother 44       CAD/stenting   Hyperlipidemia Mother    Arthritis Mother        ostearthritis   Hypertension Mother    Kidney disease Mother    Irritable bowel syndrome Mother    Stroke Mother 75       CVA   Fibromyalgia Mother    Eczema Mother    Sjogren's syndrome Mother    Hyperlipidemia Father    Neuropathy Father    Allergies Father        shellfish   Prostatitis Father    Hyperlipidemia Sister    ADD / ADHD Sister    Bipolar disorder Sister    Depression Sister    Hypertension Brother    OCD Brother    Anxiety disorder Daughter    ADD / ADHD Daughter    Asthma Daughter    OCD Son    Colon cancer Neg Hx    Inflammatory bowel disease Neg Hx    Allergic rhinitis Neg Hx     Angioedema Neg Hx    Atopy Neg Hx    Immunodeficiency Neg Hx    Urticaria Neg Hx    Past Surgical History:  Procedure Laterality Date   ABLATION  2022   CARPAL TUNNEL RELEASE Right    08/2019   CARPAL TUNNEL RELEASE Left 12/14/2023   CESAREAN SECTION  2003   CESAREAN SECTION  2005   Per [facility] New Patient Packet   COLONOSCOPY  2017   Dr.Jacobs   cyst drained Left 2023   knee   DENTAL  SURGERY  2009   Fromed tooth and chronic infection   DENTAL SURGERY  01/25/2024   DILITATION & CURRETTAGE/HYSTROSCOPY WITH NOVASURE ABLATION N/A 06/03/2020   Procedure: DILATATION & CURETTAGE/HYSTEROSCOPY WITH NOVASURE ABLATION;  Surgeon: Izell Harari, MD;  Location: [facility];  Service: Gynecology;  Laterality: N/A;   ENDOMETRIAL BIOPSY  04/20/2020       EYE SURGERY Bilateral    cryoretinopexy   hang nail procedure Left 2024   great toe   Social History   Social History Narrative   Marital status: married x 17 years; happily married      Children: 2 children (15, 85)      Lives: with husband, 2 children.      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 4 (including me)       Pets: 1 dog, 2 cats       Highest level or education completed: Undergraduate degree       Current/Past profession: Gaffer       Exercise:        Yes          Type and how often: 30-60 min walking 5 days a week. Pilate's 1 x a week, and horseback riding 2 times a week          Living Will: No   DNR: no,  would like to discuss one    POA/HPOA: No      Functional Status:   Do you have difficulty bathing or dressing yourself? Left blank    Do you have difficulty preparing food or eating? Left blank   Do you have difficulty managing your medications? Left blank   Do you have difficulty managing your finances? Left blank   Do you have difficulty affording your medications? Left blank   Immunization History  Administered Date(s) Administered   Influenza,inj,Quad PF,6+ Mos 05/12/2014, 11/27/2015, 05/23/2016, 10/30/2017, 07/30/2018   Influenza-Unspecified 07/08/2022, 07/10/2023   Moderna Sars-Covid-2 Vaccination 10/27/2020, 10/31/2020   PFIZER(Purple Top)SARS-COV-2 Vaccination 12/06/2019, 12/16/2019, 05/27/2020, 07/07/2022   Pfizer(Comirnaty)Fall Seasonal Vaccine 12 years and older 07/10/2023   Pneumococcal Conjugate-13 05/23/2016   Tdap 09/28/2011, 08/12/2020     Objective: Vital Signs: BP 126/83 (BP Location: Left Arm, Patient Position: Sitting, Cuff Size: Normal)   Pulse 92   Resp 14   Ht 5' 6 (1.676 m)   Wt 167 lb (75.8 kg)   BMI 26.95 kg/m    Physical Exam Vitals and nursing note reviewed.  Constitutional:      Appearance: She is well-developed.  HENT:     Head: Normocephalic and atraumatic.  Eyes:     Conjunctiva/sclera: Conjunctivae normal.  Cardiovascular:     Rate and Rhythm: Normal rate and regular rhythm.     Heart sounds: Normal heart  sounds.  Pulmonary:     Effort: Pulmonary effort is normal.     Breath sounds: Normal breath sounds.  Abdominal:     General: Bowel sounds are normal.     Palpations: Abdomen is soft.  Musculoskeletal:     Cervical back: Normal range of motion.  Lymphadenopathy:     Cervical: No cervical adenopathy.  Skin:    General: Skin is warm and dry.     Capillary Refill: Capillary refill takes less than 2 seconds.  Neurological:     Mental Status: She is alert and oriented to person, place, and time.  Psychiatric:        Behavior:  Behavior normal.      Musculoskeletal Exam: C-spine, thoracic spine, lumbar spine good range of motion.  Shoulder joints have good range of motion with no discomfort.  No tenderness along the elbow joint line.  Slightly limited extension of the right wrist with possible extensor tenosynovitis.  Left wrist has good range of motion.  PIP and DIP thickening consistent with osteoarthritis of both hands.  Complete fist formation bilaterally.  Hip joints are good range of motion.  Left knee is in a compression brace.  Right knee has good range of motion no warmth or effusion.  Ankle joints have good range of motion with no joint tenderness.  Thickening of bilateral first MTP joints.  CDAI Exam: CDAI Score: -- Patient Global: --; Provider Global: -- Swollen: --; Tender: -- Joint Exam 04/11/2024   No joint exam has been documented for this visit   There is currently no information documented on the homunculus. Go to the Rheumatology activity and complete the homunculus joint exam.  Investigation: No additional findings.  Imaging: US  Guided Needle Placement - No Linked Charges Result Date: 04/01/2024 Ultrasound-guided LEFT knee intra-articular aspiration and subsequent injection.  This was incorrectly labeled *Right* on one of the images, but the procedure was performed only on the left knee.  XR Hand Complete Right Result Date: 04/01/2024 3 views of the right hand including AP, oblique and lateral film were ordered and reviewed by myself today.  X-rays demonstrate no significant arthritic change about the hand or wrist.  There is a mild negative ulnar variance.  The lateral film does show a small degree of soft tissue swelling overlying the metacarpal region.  No calcification within the soft tissue present.   Recent Labs: Lab Results  Component Value Date   WBC 4.3 11/01/2023   HGB 13.7 11/01/2023   PLT 289 11/01/2023   NA 139 11/01/2023   K 4.5 11/01/2023   CL 101 11/01/2023   CO2 34  (H) 11/01/2023   GLUCOSE 78 11/01/2023   BUN 16 11/01/2023   CREATININE 0.81 11/01/2023   BILITOT 0.5 11/01/2023   ALKPHOS 26 (L) 06/01/2020   AST 20 11/01/2023   ALT 16 11/01/2023   PROT 7.3 11/01/2023   ALBUMIN 3.8 06/01/2020   CALCIUM 10.1 11/01/2023   GFRAA 93 01/26/2021    Speciality Comments: PLQ Eye Exam: 12/20/2023 WNL @ [facility] (in [place]) Follow up in 1 year.   ANA 1:320 speckled, history of oral ulcers, lymphadenopathy, photosensitivity, Raynauds, erythema nodosum, inflammatory arthritis.  Procedures:  No procedures performed Allergies: Cromolyn  sodium, Avelox [moxifloxacin hcl in nacl], Cefaclor, Dust mite extract, Latex, Mold extract [trichophyton], Moxifloxacin, Red wine complex [germanium], Meloxicam , and Naproxen      Assessment / Plan:     Visit Diagnoses: Other systemic lupus erythematosus with other organ involvement (HCC) -  ANA 1:320 speckled, history of oral ulcers, lymphadenopathy, photosensitivity, Raynauds, erythema nodosum, inflammatory arthritis: She has not had any signs or symptoms of a systemic lupus flare.  She has clinically been doing well taking Plaquenil  200 mg 1 tablet by mouth twice daily.  She is tolerating Plaquenil  without any side effects and has not had any gaps in therapy.  She continues to have ongoing photosensitivity so she tries to avoid direct sun exposure.  She has not had any recent rashes or signs of alopecia.  She has occasional oral ulcers typically exacerbated by her stress levels.  She has had less frequent symptoms of Raynaud's phenomenon.  No recurrence of EN lesions.   Lab work on 11/01/23: no proteinuria, ANA negative, dsDNA negative, complments WNL, and ESR WNL.  Orders for the following lab work released today.   She will remain on Plaquenil  as prescribed.  She was advised to notify us  if she develops signs or symptoms of a flare.  She will follow-up in the office in 5 months or sooner if needed. - Plan:  Protein / creatinine ratio, urine, CBC with Differential/Platelet, Anti-DNA antibody, double-stranded, C3 and C4, Sedimentation rate, Comprehensive metabolic panel with GFR  High risk medication use - Plaquenil  200 mg 1 tablet by mouth twice daily.   PLQ Eye Exam: 12/20/2023 WNL @ [facility] (in [place]) Follow up in 1 year.  CBC and CMP drawn on 11/01/2023.  Orders for CBC and CMP released today.  - Plan: CBC with Differential/Platelet, Comprehensive metabolic panel with GFR  Raynaud's phenomenon without gangrene:  Not currently symptomatic.  No digital ulcerations noted.  Capillary fill 2 to 3 seconds.  Erythema nodosum: No EN lesions currently.   Chronic right shoulder pain - Dr. Jerri. MRI 08/09/2022: Moderate tendinosis supraspinatus tendon with partial-thickness bursal surface tear and partial-thickness articular surface tear.  She has good range of motion with no discomfort currently.  Primary osteoarthritis of both hands: PIP and DIP thickening noted.  No synovitis noted.  Complete fist formation bilaterally. Possible mild extensor tenosynovitis of the right wrist was noted.  She recently had an ultrasound of the right wrist and was told that she has a cyst.  She has been using compression and ice to help with the pain.  She is planning to proceed with an MRI of the right wrist if her symptoms persist or worsen.  History of torn meniscus of left knee: Under the care of Dr. Burnetta.  Patient had a left knee joint aspiration and cortisone injection performed on 04/01/2024.  She is planning to proceed with physical therapy.  Not planning surgical intervention at this time.  She is using a compression brace for support.  Primary osteoarthritis of both feet - Under the care of Dr. Afton to proceed with surgical intervention but does not currently have a surgical date scheduled. Thickening of bilateral first MTP joints.  No synovitis noted.  S/P carpal tunnel release  -Bilateral--doing well.  Left performed by Dr. Jerri on 12/14/23.  Not currently symptomatic.    Chronic left SI joint pain: Not currently symptomatic.  Other medical conditions are listed as follows:  Attention deficit hyperactivity disorder (ADHD), predominantly inattentive type  Vitamin D  deficiency  History of hyperlipidemia  History of anxiety  History of retinal tear  Hypermobility of joint  Other fatigue  Terminal ileitis without complication (HCC)  Orders: Orders Placed This Encounter  Procedures   Protein / creatinine ratio, urine   CBC with Differential/Platelet  Anti-DNA antibody, double-stranded   C3 and C4   Sedimentation rate   Comprehensive metabolic panel with GFR   No orders of the defined types were placed in this encounter.   Follow-Up Instructions: Return in about 5 months (around 09/11/2024) for Systemic lupus erythematosus.   Waddell CHRISTELLA Craze, PA-C  Note - This record has been created using Dragon software.  Chart creation errors have been sought, but may not always  have been located. Such creation errors do not reflect on  the standard of medical care.

## 2024-04-11 ENCOUNTER — Ambulatory Visit: Attending: Physician Assistant | Admitting: Physician Assistant

## 2024-04-11 ENCOUNTER — Encounter: Payer: Self-pay | Admitting: Physician Assistant

## 2024-04-11 VITALS — BP 126/83 | HR 92 | Resp 14 | Ht 66.0 in | Wt 167.0 lb

## 2024-04-11 DIAGNOSIS — E559 Vitamin D deficiency, unspecified: Secondary | ICD-10-CM

## 2024-04-11 DIAGNOSIS — M19042 Primary osteoarthritis, left hand: Secondary | ICD-10-CM

## 2024-04-11 DIAGNOSIS — M533 Sacrococcygeal disorders, not elsewhere classified: Secondary | ICD-10-CM

## 2024-04-11 DIAGNOSIS — M3219 Other organ or system involvement in systemic lupus erythematosus: Secondary | ICD-10-CM

## 2024-04-11 DIAGNOSIS — R5383 Other fatigue: Secondary | ICD-10-CM

## 2024-04-11 DIAGNOSIS — F900 Attention-deficit hyperactivity disorder, predominantly inattentive type: Secondary | ICD-10-CM

## 2024-04-11 DIAGNOSIS — Z9889 Other specified postprocedural states: Secondary | ICD-10-CM

## 2024-04-11 DIAGNOSIS — Z8639 Personal history of other endocrine, nutritional and metabolic disease: Secondary | ICD-10-CM

## 2024-04-11 DIAGNOSIS — M249 Joint derangement, unspecified: Secondary | ICD-10-CM

## 2024-04-11 DIAGNOSIS — M19041 Primary osteoarthritis, right hand: Secondary | ICD-10-CM

## 2024-04-11 DIAGNOSIS — Z8669 Personal history of other diseases of the nervous system and sense organs: Secondary | ICD-10-CM

## 2024-04-11 DIAGNOSIS — L52 Erythema nodosum: Secondary | ICD-10-CM | POA: Diagnosis not present

## 2024-04-11 DIAGNOSIS — I7300 Raynaud's syndrome without gangrene: Secondary | ICD-10-CM | POA: Diagnosis not present

## 2024-04-11 DIAGNOSIS — Z79899 Other long term (current) drug therapy: Secondary | ICD-10-CM

## 2024-04-11 DIAGNOSIS — M19072 Primary osteoarthritis, left ankle and foot: Secondary | ICD-10-CM

## 2024-04-11 DIAGNOSIS — Z87828 Personal history of other (healed) physical injury and trauma: Secondary | ICD-10-CM

## 2024-04-11 DIAGNOSIS — K5000 Crohn's disease of small intestine without complications: Secondary | ICD-10-CM

## 2024-04-11 DIAGNOSIS — M19071 Primary osteoarthritis, right ankle and foot: Secondary | ICD-10-CM

## 2024-04-11 DIAGNOSIS — G8929 Other chronic pain: Secondary | ICD-10-CM

## 2024-04-11 DIAGNOSIS — Z8659 Personal history of other mental and behavioral disorders: Secondary | ICD-10-CM

## 2024-04-11 DIAGNOSIS — M25511 Pain in right shoulder: Secondary | ICD-10-CM

## 2024-04-12 LAB — CBC WITH DIFFERENTIAL/PLATELET
Absolute Lymphocytes: 1336 {cells}/uL (ref 850–3900)
Absolute Monocytes: 448 {cells}/uL (ref 200–950)
Basophils Absolute: 28 {cells}/uL (ref 0–200)
Basophils Relative: 0.7 %
Eosinophils Absolute: 200 {cells}/uL (ref 15–500)
Eosinophils Relative: 5 %
HCT: 41 % (ref 35.0–45.0)
Hemoglobin: 13.1 g/dL (ref 11.7–15.5)
MCH: 30.9 pg (ref 27.0–33.0)
MCHC: 32 g/dL (ref 32.0–36.0)
MCV: 96.7 fL (ref 80.0–100.0)
MPV: 9.7 fL (ref 7.5–12.5)
Monocytes Relative: 11.2 %
Neutro Abs: 1988 {cells}/uL (ref 1500–7800)
Neutrophils Relative %: 49.7 %
Platelets: 306 Thousand/uL (ref 140–400)
RBC: 4.24 Million/uL (ref 3.80–5.10)
RDW: 13.2 % (ref 11.0–15.0)
Total Lymphocyte: 33.4 %
WBC: 4 Thousand/uL (ref 3.8–10.8)

## 2024-04-12 LAB — C3 AND C4
C3 Complement: 96 mg/dL (ref 83–193)
C4 Complement: 23 mg/dL (ref 15–57)

## 2024-04-12 LAB — COMPREHENSIVE METABOLIC PANEL WITH GFR
AG Ratio: 2 (calc) (ref 1.0–2.5)
ALT: 17 U/L (ref 6–29)
AST: 19 U/L (ref 10–35)
Albumin: 4.9 g/dL (ref 3.6–5.1)
Alkaline phosphatase (APISO): 44 U/L (ref 37–153)
BUN: 12 mg/dL (ref 7–25)
CO2: 29 mmol/L (ref 20–32)
Calcium: 9.5 mg/dL (ref 8.6–10.4)
Chloride: 100 mmol/L (ref 98–110)
Creat: 0.88 mg/dL (ref 0.50–1.03)
Globulin: 2.4 g/dL (ref 1.9–3.7)
Glucose, Bld: 65 mg/dL (ref 65–99)
Potassium: 4.2 mmol/L (ref 3.5–5.3)
Sodium: 137 mmol/L (ref 135–146)
Total Bilirubin: 0.6 mg/dL (ref 0.2–1.2)
Total Protein: 7.3 g/dL (ref 6.1–8.1)
eGFR: 80 mL/min/1.73m2 (ref >=60)

## 2024-04-12 LAB — ANTI-DNA ANTIBODY, DOUBLE-STRANDED: ds DNA Ab: 1 [IU]/mL

## 2024-04-12 LAB — SEDIMENTATION RATE: Sed Rate: 6 mm/h (ref 0–30)

## 2024-04-12 LAB — PROTEIN / CREATININE RATIO, URINE
Creatinine, Urine: 47 mg/dL (ref 20–275)
Protein/Creat Ratio: 85 mg/g{creat} (ref 24–184)
Protein/Creatinine Ratio: 0.085 mg/mg{creat} (ref 0.024–0.184)
Total Protein, Urine: 4 mg/dL — ABNORMAL LOW (ref 5–24)

## 2024-04-15 ENCOUNTER — Ambulatory Visit: Payer: Self-pay | Admitting: Physician Assistant

## 2024-05-02 ENCOUNTER — Ambulatory Visit: Admitting: Sports Medicine

## 2024-05-02 ENCOUNTER — Encounter: Payer: Self-pay | Admitting: Sports Medicine

## 2024-05-02 DIAGNOSIS — M65931 Unspecified synovitis and tenosynovitis, right forearm: Secondary | ICD-10-CM | POA: Diagnosis not present

## 2024-05-02 DIAGNOSIS — M67441 Ganglion, right hand: Secondary | ICD-10-CM | POA: Diagnosis not present

## 2024-05-02 DIAGNOSIS — M25531 Pain in right wrist: Secondary | ICD-10-CM

## 2024-05-02 DIAGNOSIS — G8929 Other chronic pain: Secondary | ICD-10-CM

## 2024-05-02 DIAGNOSIS — M25562 Pain in left knee: Secondary | ICD-10-CM | POA: Diagnosis not present

## 2024-05-02 DIAGNOSIS — M25462 Effusion, left knee: Secondary | ICD-10-CM

## 2024-05-02 NOTE — Progress Notes (Signed)
 Patient says her knee is doing much better. It does occasionally bother her if she is more active on a given day, but she is overall happy with it since the injection and aspiration.   Patient says that ice and compression was somewhat helpful for her hand, although it was difficult to keep up with that and became uncomfortable to wrap at night. She says that she saw her rheumatologist in follow up, and they did mention checking for tenosynovitis.

## 2024-05-02 NOTE — Progress Notes (Signed)
 Robin [name] - 53 y.o. female MRN [id number]  Date of birth: [birth year]  Office Visit Note: Visit Date: 05/02/2024 PCP: Leonarda Roxan BROCKS, NP Referred by: Leonarda Roxan BROCKS, NP  Subjective: Chief Complaint  Patient presents with   Left Knee - Follow-up   Right Hand - Follow-up   HPI: Robin [name] is a pleasant 53 y.o. female who presents today for follow-up of left knee previous effusion/pain, right wrist/dorsal hand swelling.  Left knee -left knee is doing much better.  Has suspected lateral meniscus tear that did very well with ultrasound-guided aspiration and injection.  She was very diligent about compression sleeve and activity modification.  No giving out, mild discomfort only with more vigorous activity but overall doing well.  Right hand/wrist -she still is noticing swelling over the dorsal aspect of the distal wrist joint and the proximal hand/extensor region.  She was using ice and a compression which was helpful but when she stopped this continue to come back.  She has pain with pushing down into the wrist and lifting up as well as with extension and flexion.  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    She continues her low-dose naltrexone  6 mg daily which does help with her joint and overall pain.  Pertinent ROS were reviewed with the patient and found to be negative unless otherwise specified above in HPI.   Assessment & Plan: Visit Diagnoses:  1. Pain in right wrist   2. Ganglion cyst of tendon sheath of right hand   3. Extensor tenosynovitis of right wrist   4. Chronic pain of left knee   5. Effusion, left knee    Plan: Impression is vastly improved chronic left knee pain with previous effusion secondary to a likely lateral meniscus tear that did great with ultrasound-guided aspiration and injection.  Recommended knee compression sleeve with vigorous activity and activity  modification.  She is having ongoing left dorsal sided wrist/hand pain with months of swelling versus cyst/ganglion cyst over the distal radiocarpal joint in the dorsal extensors of the proximal hand.  Given the chronicity and the pain associated with this, we discussed moving forward with MRI to evaluate whether this is a degree of tenosynovitis likely from her SLE versus ganglion cystic change.  I also would like to evaluate the cartilage about the radiocarpal joint given her pain that emanates from within.  I will send her a MyChart message once I review to discuss next steps that may include follow-up with me versus her hand surgeon Dr. Erwin.  She will continue her low-dose naltrexone  6 mg daily for pain control.  Follow-up: Return for I will message her once MRI results .   Meds & Orders: No orders of the defined types were placed in this encounter.   Orders Placed This Encounter  Procedures   MR Wrist Right w/o contrast     Procedures: No procedures performed      Clinical History: No specialty comments available.  She reports that she quit smoking about 22 years ago. Her smoking use included cigarettes. She started smoking about 32 years ago. She has a 7.5 pack-year smoking history. She has never been exposed to tobacco smoke. She has never used smokeless tobacco. No results for input(s): HGBA1C, LABURIC in the last 8760 hours.  Objective:    Physical Exam  Gen: Well-appearing, in no acute distress; non-toxic CV: Well-perfused. Warm.  Resp:  Breathing unlabored on room air; no wheezing. Psych: Fluid speech in conversation; appropriate affect; normal thought process  Ortho Exam - Left knee: No joint line tenderness.  There is a very trace effusion but significant improved from previous visit.  Full range of motion in flexion and extension.  - Right hand/wrist: There is no significant tenderness palpating within the scapholunate juncture but there is some mild tenderness at the  distal radiocarpal joint.  Positive fovea sign but negative ECU Synergy test.  There is continued dorsal swelling of the dorsal aspect of the radiocarpal joint that extends to the proximal dorsal hand near the wrist extensors.  Question extensor tendinitis versus possible underlying cyst/ganglion cyst.  There is pain with resisted wrist extension as well as loading into the wrist joint.  Imaging: No results found.  Past Medical/Family/Surgical/Social History: Medications & Allergies reviewed per EMR, new medications updated. Patient Active Problem List   Diagnosis Date Noted   Partial nontraumatic tear of right rotator cuff 12/22/2022   Left hamstring injury 07/15/2022   Labral tear of shoulder, degenerative, right 06/21/2022   Left carpal tunnel syndrome 05/26/2022   Foot sprain, left, initial encounter 05/19/2022   Baker's cyst of knee, left 04/21/2022   Right carpal tunnel syndrome 03/16/2022   Flushing 04/14/2021   Hypermobility syndrome 11/27/2020   Seasonal and perennial allergic rhinitis 11/06/2018   Mild intermittent asthma without complication 11/06/2018   Flexural atopic dermatitis 11/06/2018   History of hyperlipidemia 11/08/2016   History of anxiety 11/08/2016   History of retinal tear 11/08/2016   History of Bilateral plantar fasciitis 08/10/2016   High risk medication use 07/04/2016   Lupus (HCC) 05/23/2016   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HCC) 03/18/2016   ADHD (attention deficit hyperactivity disorder) 12/22/2014   Raynaud's phenomenon 10/17/2013   Hyperlipidemia 09/28/2011   Past Medical History:  Diagnosis Date   ADD (attention deficit disorder)    Allergy     Anemia    Anorexia nervosa with bulimia    Per [facility] New Patient Packet   Anxiety    Asthma    Carpal tunnel syndrome    Per [facility] New Patient Packet   Depression    Dry eyes    Per [facility] New [facility] Packet   Dry mouth    Per [facility] New Patient Packet   Fibroids    Per [facility] New Patient Packet   Ganglion cyst     right hand   History of hip x-ray 2021   Per [facility] New Patient Packet   History of Papanicolaou smear of cervix 2021   Per [facility] New Patient Packet, Dr.Pickens   Hyperlipidemia    Ileitis    Lupus    PONV (postoperative nausea and vomiting)    mild nausea    Raynaud disease    Retinal tear    Per [facility] New Patient Packet   Systemic lupus erythematosus (HCC)    Torn meniscus    left   Family History  Problem Relation Age of Onset   Heart disease Mother 64       CAD/stenting   Hyperlipidemia Mother    Arthritis Mother        ostearthritis   Hypertension Mother    Kidney disease Mother    Irritable bowel syndrome Mother    Stroke Mother 59       CVA   Fibromyalgia Mother    Eczema Mother    Sjogren's syndrome Mother    Hyperlipidemia Father    Neuropathy Father  Allergies Father        shellfish   Prostatitis Father    Hyperlipidemia Sister    ADD / ADHD Sister    Bipolar disorder Sister    Depression Sister    Hypertension Brother    OCD Brother    Anxiety disorder Daughter    ADD / ADHD Daughter    Asthma Daughter    OCD Son    Colon cancer Neg Hx    Inflammatory bowel disease Neg Hx    Allergic rhinitis Neg Hx    Angioedema Neg Hx    Atopy Neg Hx    Immunodeficiency Neg Hx    Urticaria Neg Hx    Past Surgical History:  Procedure Laterality Date   ABLATION  2022   CARPAL TUNNEL RELEASE Right    08/2019   CARPAL TUNNEL RELEASE Left 12/14/2023   CESAREAN SECTION  2003   CESAREAN SECTION  2005   Per [facility] New Patient Packet   COLONOSCOPY  2017   Dr.Jacobs   cyst drained Left 2023   knee   DENTAL SURGERY  2009   Fromed tooth and chronic infection   DENTAL SURGERY  01/25/2024   DILITATION & CURRETTAGE/HYSTROSCOPY WITH NOVASURE ABLATION N/A 06/03/2020   Procedure: DILATATION & CURETTAGE/HYSTEROSCOPY WITH NOVASURE ABLATION;  Surgeon: Izell Harari, MD;  Location: [facility];  Service: Gynecology;  Laterality: N/A;   ENDOMETRIAL BIOPSY   04/20/2020       EYE SURGERY Bilateral    cryoretinopexy   hang nail procedure Left 2024   great toe   Social History   Occupational History   Not on file  Tobacco Use   Smoking status: Former    Current packs/day: 0.00    Average packs/day: 0.8 packs/day for 10.0 years (7.5 ttl pk-yrs)    Types: Cigarettes    Start date: 07/06/1991    Quit date: 07/05/2001    Years since quitting: 22.8    Passive exposure: Never   Smokeless tobacco: Never  Vaping Use   Vaping status: Never Used  Substance and Sexual Activity   Alcohol use: Yes    Comment: occ   Drug use: Never   Sexual activity: Yes    Birth control/protection: Condom

## 2024-05-07 ENCOUNTER — Encounter: Payer: Self-pay | Admitting: Sports Medicine

## 2024-05-08 ENCOUNTER — Encounter: Payer: Self-pay | Admitting: Sports Medicine

## 2024-05-10 ENCOUNTER — Other Ambulatory Visit: Payer: Self-pay | Admitting: Physician Assistant

## 2024-05-10 DIAGNOSIS — M3219 Other organ or system involvement in systemic lupus erythematosus: Secondary | ICD-10-CM

## 2024-05-10 NOTE — Telephone Encounter (Signed)
 Last Fill: 02/14/2024  Eye exam: 12/20/2023 WNL   Labs: 04/11/2024 CBC and CMP WNL complements WNL dsDNA negative  ESR WNL  No proteinuria.  Labs are not consistent with a flare no change in treatment recommended at this time.   Next Visit: 08/27/2024  Last Visit: 04/11/2024  IK:Nuyzm systemic lupus erythematosus with other organ involvement (HCC)   Current Dose per office note 04/11/2024: Plaquenil  200 mg 1 tablet by mouth twice daily.   Okay to refill Plaquenil ?

## 2024-05-17 ENCOUNTER — Other Ambulatory Visit

## 2024-05-27 ENCOUNTER — Ambulatory Visit
Admission: RE | Admit: 2024-05-27 | Discharge: 2024-05-27 | Disposition: A | Discharge status: Home / Self Care | Source: Ambulatory Visit | Attending: Sports Medicine | Admitting: Sports Medicine

## 2024-05-27 DIAGNOSIS — M67441 Ganglion, right hand: Secondary | ICD-10-CM

## 2024-05-27 DIAGNOSIS — M19031 Primary osteoarthritis, right wrist: Secondary | ICD-10-CM | POA: Diagnosis not present

## 2024-05-27 DIAGNOSIS — M65931 Unspecified synovitis and tenosynovitis, right forearm: Secondary | ICD-10-CM

## 2024-05-27 DIAGNOSIS — M25531 Pain in right wrist: Secondary | ICD-10-CM

## 2024-05-29 ENCOUNTER — Ambulatory Visit: Payer: Self-pay | Admitting: Sports Medicine

## 2024-06-04 ENCOUNTER — Other Ambulatory Visit: Payer: Self-pay | Admitting: Orthopedic Surgery

## 2024-06-04 DIAGNOSIS — M65931 Unspecified synovitis and tenosynovitis, right forearm: Secondary | ICD-10-CM

## 2024-06-11 NOTE — Telephone Encounter (Signed)
 Ok to try sending in Rayos  5 mg at bedtime x10 days.  I do not recommend the long term use of rayos . Rayos  can be costly, so we usually prescribe prednisone  but if she would like to try rayos  we can send in low dose for 10 days.

## 2024-06-12 MED ORDER — RAYOS 5 MG PO TBEC: 5.0000 mg | DELAYED_RELEASE_TABLET | Freq: Every day | ORAL | 0 refills | Status: DC

## 2024-06-14 ENCOUNTER — Other Ambulatory Visit: Payer: Self-pay | Admitting: Rheumatology

## 2024-06-14 NOTE — Telephone Encounter (Signed)
 Contacted the pharmacy back and spoke with [place]. Riva states they do not have and cannot make a 12 hour release Rayos . Riva states the only thing they can do is make Prednisone  capsules with 40% amount of of Methocel to make it delayed release and it would be a delayed release time of 5-8 hours, but with no guaranteed amount of time. Riva states they would only be able to do that with Prednisone  and not Rayos . Attempted to contact the patient to see if she had another pharmacy in mind and could not leave a message because the mailbox was full.

## 2024-06-14 NOTE — Telephone Encounter (Signed)
 Dorien from [facility] left a voicemail stating they received the prescription for Predisone (Rayos ) 5 mg delayed release tablets.  We wanted to double check the reasoning for the compounding because it wasn't listed on the prescription and the patient is looking for the compounded version.  Please call back at #[phone]

## 2024-06-17 NOTE — Telephone Encounter (Signed)
Attempted to contact the patient and could not leave a message because the mail box was full.  

## 2024-06-18 MED ORDER — RAYOS 5 MG PO TBEC: 5.0000 mg | DELAYED_RELEASE_TABLET | Freq: Every day | ORAL | 0 refills | Status: AC

## 2024-06-18 NOTE — Telephone Encounter (Signed)
 Contacted the patient and patient states now she would like the prescription sent to [facility] in [place], she states she had gotten the Rayos  from there in the past. Will call [facility] pharmacy to cancel other prescription if new prescription sent. Please review and sign.

## 2024-06-18 NOTE — Telephone Encounter (Signed)
 Therapist, occupational and spoke to [place], advised Pickens the prescription has been sent to another pharmacy and he can cancel It at there pharmacy. Ben verbalized understanding.

## 2024-06-18 NOTE — Telephone Encounter (Signed)
 [facility] faxed a prior authorization request for the patient's Rayos  prescription. Attempted to pull up the prior authorization on cover my meds using the key and the prior authorization shows it has been archived. Contacted the patient to inquire if she still needed the prior authorization for the medication and the patient is going to contact the pharmacy to see what is going on.

## 2024-06-19 NOTE — Telephone Encounter (Signed)
 Contacted [facility] and spoke with [place]. Kerri believes they most likely will not be able to order the Rayos  either. Kerri states she could contact a specialty pharmacy but the medication could still be around $1,000 dollars. Attempted to contact the patient to inquire if she would like to switch the prescription to a regular prednisone  taper and to which pharmacy she would like the medication to go to. Attempted to contact the patient and could not leave a message because the mailbox was full.

## 2024-06-20 ENCOUNTER — Ambulatory Visit: Admitting: Orthopedic Surgery

## 2024-06-20 DIAGNOSIS — M65931 Unspecified synovitis and tenosynovitis, right forearm: Secondary | ICD-10-CM | POA: Diagnosis not present

## 2024-06-20 NOTE — Progress Notes (Unsigned)
 Robin [name] - 53 y.o. female MRN [id number]  Date of birth: Mar 17, 1971  Office Visit Note: Visit Date: 06/20/2024 PCP: Leonarda Roxan BROCKS, NP Referred by: Burnetta Brunet, DO  Subjective: No chief complaint on file.  HPI: Robin [name] is a pleasant 53 y.o. female who presents today for right hand/wrist pain. Pain present for about 5 months with no known injury.   Pertinent ROS were reviewed with the patient and found to be negative unless otherwise specified above in HPI.   Visit Reason: right wrist Duration of symptoms:  5 months Hand dominance: right Occupation: Computer Work  Diabetic: No Smoking: No Heart/Lung History: none Blood Thinners:  none  Prior Testing/EMG: MRI 05/28/24, xrays 04/01/24 Injections (Date): none Treatments: compression, icing Prior Surgery: none  *dorsal nodule*-    Assessment & Plan: Visit Diagnoses: No diagnosis found.  Plan: ***  Follow-up: No follow-ups on file.   Meds & Orders: No orders of the defined types were placed in this encounter.  No orders of the defined types were placed in this encounter.    Procedures: No procedures performed      Clinical History: No specialty comments available.  She reports that she quit smoking about 22 years ago. Her smoking use included cigarettes. She started smoking about 32 years ago. She has a 7.5 pack-year smoking history. She has never been exposed to tobacco smoke. She has never used smokeless tobacco. No results for input(s): HGBA1C, LABURIC in the last 8760 hours.  Objective:   Vital Signs: There were no vitals taken for this visit.  Physical Exam  Gen: Well-appearing, in no acute distress; non-toxic CV: Regular Rate. Well-perfused. Warm.  Resp: Breathing unlabored on room air; no wheezing. Psych: Fluid speech in conversation; appropriate affect; normal thought process  Ortho Exam - ***   Imaging: No results found.  Past Medical/Family/Surgical/Social  History: Medications & Allergies reviewed per EMR, new medications updated. Patient Active Problem List   Diagnosis Date Noted   Partial nontraumatic tear of right rotator cuff 12/22/2022   Left hamstring injury 07/15/2022   Labral tear of shoulder, degenerative, right 06/21/2022   Left carpal tunnel syndrome 05/26/2022   Foot sprain, left, initial encounter 05/19/2022   Baker's cyst of knee, left 04/21/2022   Right carpal tunnel syndrome 03/16/2022   Flushing 04/14/2021   Hypermobility syndrome 11/27/2020   Seasonal and perennial allergic rhinitis 11/06/2018   Mild intermittent asthma without complication 11/06/2018   Flexural atopic dermatitis 11/06/2018   History of hyperlipidemia 11/08/2016   History of anxiety 11/08/2016   History of retinal tear 11/08/2016   History of Bilateral plantar fasciitis 08/10/2016   High risk medication use 07/04/2016   Lupus (HCC) 05/23/2016   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HCC) 03/18/2016   ADHD (attention deficit hyperactivity disorder) 12/22/2014   Raynaud's phenomenon 10/17/2013   Hyperlipidemia 09/28/2011   Past Medical History:  Diagnosis Date   ADD (attention deficit disorder)    Allergy     Anemia    Anorexia nervosa with bulimia    Per [facility] New Patient Packet   Anxiety    Asthma    Carpal tunnel syndrome    Per [facility] New Patient Packet   Depression    Dry eyes    Per [facility] New Patient Packet   Dry mouth    Per [facility] New Patient Packet   Fibroids    Per [facility] New Patient Packet   Ganglion cyst    right hand   History of hip x-ray  2021   Per [facility] New Patient Packet   History of Papanicolaou smear of cervix 2021   Per [facility] New Patient Packet, Dr.Pickens   Hyperlipidemia    Ileitis    Lupus    PONV (postoperative nausea and vomiting)    mild nausea    Raynaud disease    Retinal tear    Per [facility] New Patient Packet   Systemic lupus erythematosus (HCC)    Torn meniscus    left   Family History  Problem Relation Age of Onset   Heart disease  Mother 55       CAD/stenting   Hyperlipidemia Mother    Arthritis Mother        ostearthritis   Hypertension Mother    Kidney disease Mother    Irritable bowel syndrome Mother    Stroke Mother 58       CVA   Fibromyalgia Mother    Eczema Mother    Sjogren's syndrome Mother    Hyperlipidemia Father    Neuropathy Father    Allergies Father        shellfish   Prostatitis Father    Hyperlipidemia Sister    ADD / ADHD Sister    Bipolar disorder Sister    Depression Sister    Hypertension Brother    OCD Brother    Anxiety disorder Daughter    ADD / ADHD Daughter    Asthma Daughter    OCD Son    Colon cancer Neg Hx    Inflammatory bowel disease Neg Hx    Allergic rhinitis Neg Hx    Angioedema Neg Hx    Atopy Neg Hx    Immunodeficiency Neg Hx    Urticaria Neg Hx    Past Surgical History:  Procedure Laterality Date   ABLATION  2022   CARPAL TUNNEL RELEASE Right    08/2019   CARPAL TUNNEL RELEASE Left 12/14/2023   CESAREAN SECTION  2003   CESAREAN SECTION  2005   Per [facility] New Patient Packet   COLONOSCOPY  2017   Dr.Jacobs   cyst drained Left 2023   knee   DENTAL SURGERY  2009   Fromed tooth and chronic infection   DENTAL SURGERY  01/25/2024   DILITATION & CURRETTAGE/HYSTROSCOPY WITH NOVASURE ABLATION N/A 06/03/2020   Procedure: DILATATION & CURETTAGE/HYSTEROSCOPY WITH NOVASURE ABLATION;  Surgeon: Izell Harari, MD;  Location: [facility];  Service: Gynecology;  Laterality: N/A;   ENDOMETRIAL BIOPSY  04/20/2020       EYE SURGERY Bilateral    cryoretinopexy   hang nail procedure Left 2024   great toe   Social History   Occupational History   Not on file  Tobacco Use   Smoking status: Former    Current packs/day: 0.00    Average packs/day: 0.8 packs/day for 10.0 years (7.5 ttl pk-yrs)    Types: Cigarettes    Start date: 07/06/1991    Quit date: 07/05/2001    Years since quitting: 22.9    Passive exposure: Never   Smokeless tobacco: Never   Vaping Use   Vaping status: Never Used  Substance and Sexual Activity   Alcohol use: Yes    Comment: occ   Drug use: Never   Sexual activity: Yes    Birth control/protection: Condom    Cece Milhouse Estela) Arlinda, M.D. [facility], Hand Surgery

## 2024-06-20 NOTE — Telephone Encounter (Signed)
Attempted to contact the patient and could not leave a message because the mail box was full.  

## 2024-06-21 NOTE — Telephone Encounter (Signed)
 Attempted to contact the patient and left a message to call the office back.

## 2024-06-25 ENCOUNTER — Other Ambulatory Visit: Payer: Self-pay

## 2024-06-25 MED ORDER — PREDNISONE 5 MG PO TABS: ORAL_TABLET | ORAL | 0 refills | Status: AC

## 2024-06-25 NOTE — Telephone Encounter (Signed)
 Patient contacted the office back and advised her I contacted [facility] and spoke with [facility]. Kerri believes they most likely will not be able to order the Rayos  either. Kerri states she could contact a specialty pharmacy but the medication could still be around $1,000 dollars. Patient verbalized understanding. Patient states she would just like a regular prednisone  taper sent into [facility]. Did contact [facility] and cancelled the Rayos  prescription. Please advise.

## 2024-06-25 NOTE — Telephone Encounter (Signed)
 Ok to send in prednisone  20 mg tapering by 5 mg every 4 days.  Take prednisone  in the morning with food and avoid the use of NSAIDs

## 2024-06-25 NOTE — Telephone Encounter (Signed)
 Attempted to contact the patient and left a message advising take prednisone  in the morning with food and avoid the use of NSAIDs. Please review and sign.

## 2024-06-25 NOTE — Addendum Note (Signed)
 Addended by: Amir Glaus P on: 06/25/2024 09:02 AM   Modules accepted: Orders

## 2024-06-25 NOTE — Telephone Encounter (Signed)
 Contacted the patient and advised the patient of what the pharmacy told me. Patient verbalized understanding. Patient states she would just like a regular prednisone  taper sent to [facility]. Will send request in a different message. Did contact the pharmacy and cancel the Rayos  prescription.

## 2024-07-03 NOTE — Progress Notes (Unsigned)
 Office Visit Note  Patient: Robin [name]             Date of Birth: 1971-02-01           MRN: [id number]             PCP: Leonarda Roxan BROCKS, NP Referring: Leonarda Roxan BROCKS, NP Visit Date: 07/17/2024 Occupation: Data Unavailable  Subjective:  Recent flare   History of Present Illness: Robin [name] is a 53 y.o. female with history of systemic lupus and osteoarthritis.  Patient remains on  Plaquenil  200 mg 1 tablet by mouth twice daily.  She is tolerating Plaquenil  without any side effects and has not had any gaps in therapy.  Patient had an MRI of the right wrist on 05/27/2024 which revealed tenosynovitis.  Patient has had ongoing pain and swelling affecting the extensor tendons of the right wrist.  Patient completed a prednisone  taper this past weekend.  Patient reports that prior to taking the prednisone  taper she actually started to have a flare including increased fatigue, 1 nasal ulcer, and generalized itching.  She has continued to have intermittent symptoms of Raynaud's phenomenon.  Activities of Daily Living:  Patient reports morning stiffness for 1 hour.   Patient Denies nocturnal pain.  Difficulty dressing/grooming: Denies Difficulty climbing stairs: Denies Difficulty getting out of chair: Reports Difficulty using hands for taps, buttons, cutlery, and/or writing: Reports  Review of Systems  Constitutional:  Positive for fatigue.  HENT:  Positive for mouth dryness. Negative for mouth sores.   Eyes:  Positive for dryness.  Respiratory:  Negative for shortness of breath.   Cardiovascular:  Positive for palpitations. Negative for chest pain.  Gastrointestinal:  Positive for constipation. Negative for blood in stool and diarrhea.  Endocrine: Positive for increased urination.  Genitourinary:  Negative for involuntary urination.  Musculoskeletal:  Positive for joint pain, joint pain, joint swelling, myalgias, muscle weakness, morning stiffness and myalgias. Negative for gait  problem and muscle tenderness.  Skin:  Positive for rash and sensitivity to sunlight. Negative for color change and hair loss.  Allergic/Immunologic: Positive for susceptible to infections.  Neurological:  Positive for dizziness and headaches.  Hematological:  Negative for swollen glands.  Psychiatric/Behavioral:  Positive for sleep disturbance. Negative for depressed mood. The patient is not nervous/anxious.     PMFS History:  Patient Active Problem List   Diagnosis Date Noted   Partial nontraumatic tear of right rotator cuff 12/22/2022   Left hamstring injury 07/15/2022   Labral tear of shoulder, degenerative, right 06/21/2022   Left carpal tunnel syndrome 05/26/2022   Foot sprain, left, initial encounter 05/19/2022   Baker's cyst of knee, left 04/21/2022   Right carpal tunnel syndrome 03/16/2022   Flushing 04/14/2021   Hypermobility syndrome 11/27/2020   Seasonal and perennial allergic rhinitis 11/06/2018   Mild intermittent asthma without complication 11/06/2018   Flexural atopic dermatitis 11/06/2018   History of hyperlipidemia 11/08/2016   History of anxiety 11/08/2016   History of retinal tear 11/08/2016   History of Bilateral plantar fasciitis 08/10/2016   High risk medication use 07/04/2016   Lupus (HCC) 05/23/2016   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HCC) 03/18/2016   ADHD (attention deficit hyperactivity disorder) 12/22/2014   Raynaud's phenomenon 10/17/2013   Hyperlipidemia 09/28/2011    Past Medical History:  Diagnosis Date   ADD (attention deficit disorder)    Allergy     Anemia    Anorexia nervosa with bulimia (HCC)    Per [facility] New Patient Packet  Anxiety    Asthma    Carpal tunnel syndrome    Per [facility] New Patient Packet   Depression    Dry eyes    Per [facility] New Patient Packet   Dry mouth    Per [facility] New Patient Packet   Fibroids    Per [facility] New Patient Packet   Ganglion cyst    right hand   History of hip x-ray 2021   Per [facility] New Patient Packet   History of  Papanicolaou smear of cervix 2021   Per [facility] New Patient Packet, Dr.Pickens   Hyperlipidemia    Ileitis    Lupus    PONV (postoperative nausea and vomiting)    mild nausea    Raynaud disease    Retinal tear    Per [facility] New Patient Packet   Systemic lupus erythematosus (HCC)    Torn meniscus    left    Family History  Problem Relation Age of Onset   Heart disease Mother 64       CAD/stenting   Hyperlipidemia Mother    Arthritis Mother        ostearthritis   Hypertension Mother    Kidney disease Mother    Irritable bowel syndrome Mother    Stroke Mother 83       CVA   Fibromyalgia Mother    Eczema Mother    Sjogren's syndrome Mother    Hyperlipidemia Father    Neuropathy Father    Allergies Father        shellfish   Prostatitis Father    Hyperlipidemia Sister    ADD / ADHD Sister    Bipolar disorder Sister    Depression Sister    Hypertension Brother    OCD Brother    Anxiety disorder Daughter    ADD / ADHD Daughter    Asthma Daughter    OCD Son    Colon cancer Neg Hx    Inflammatory bowel disease Neg Hx    Allergic rhinitis Neg Hx    Angioedema Neg Hx    Atopy Neg Hx    Immunodeficiency Neg Hx    Urticaria Neg Hx    Past Surgical History:  Procedure Laterality Date   ABLATION  2022   CARPAL TUNNEL RELEASE Right    08/2019   CARPAL TUNNEL RELEASE Left 12/14/2023   CESAREAN SECTION  2003   CESAREAN SECTION  2005   Per [facility] New Patient Packet   COLONOSCOPY  2017   Dr.Jacobs   cyst drained Left 2023   knee   DENTAL SURGERY  2009   Fromed tooth and chronic infection   DENTAL SURGERY  01/25/2024   DILITATION & CURRETTAGE/HYSTROSCOPY WITH NOVASURE ABLATION N/A 06/03/2020   Procedure: DILATATION & CURETTAGE/HYSTEROSCOPY WITH NOVASURE ABLATION;  Surgeon: Izell Harari, MD;  Location: [facility];  Service: Gynecology;  Laterality: N/A;   ENDOMETRIAL BIOPSY  04/20/2020       EYE SURGERY Bilateral    cryoretinopexy   hang nail procedure Left  2024   great toe   Social History   Tobacco Use   Smoking status: Former    Current packs/day: 0.00    Average packs/day: 0.8 packs/day for 10.0 years (7.5 ttl pk-yrs)    Types: Cigarettes    Start date: 07/06/1991    Quit date: 07/05/2001    Years since quitting: 23.0    Passive exposure: Never   Smokeless tobacco: Never  Vaping Use   Vaping status: Never  Used  Substance Use Topics   Alcohol use: Yes    Comment: occ   Drug use: Never   Social History   Social History Narrative   Marital status: married x 17 years; happily married      Children: 2 children (15, 98)      Lives: with husband, 2 children.      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 4 (including me)       Pets: 1 dog, 2 cats       Highest level or education completed: Undergraduate degree       Current/Past profession: Gaffer       Exercise:        Yes          Type and how often: 30-60 min walking 5 days a week. Pilate's 1 x a week, and horseback riding 2 times a week          Living Will: No   DNR: no, would like to discuss one    POA/HPOA: No      Functional Status:   Do you have difficulty bathing or dressing yourself? Left blank    Do you have difficulty preparing food or eating? Left blank   Do you have difficulty managing your medications? Left blank   Do you have difficulty managing your finances? Left blank   Do you have difficulty affording your medications? Left blank     Immunization History   Administered Date(s) Administered   Influenza,inj,Quad PF,6+ Mos 05/12/2014, 11/27/2015, 05/23/2016, 10/30/2017, 07/30/2018   Influenza-Unspecified 07/08/2022, 07/10/2023   Moderna Sars-Covid-2 Vaccination 10/27/2020, 10/31/2020   PFIZER(Purple Top)SARS-COV-2 Vaccination 12/06/2019, 12/16/2019, 05/27/2020, 07/07/2022   Pfizer(Comirnaty)Fall Seasonal Vaccine 12 years and older 07/10/2023   Pneumococcal Conjugate-13 05/23/2016   Tdap 09/28/2011, 08/12/2020     Objective: Vital Signs: BP 113/77   Pulse 72   Temp 97.7 F (36.5 C)   Resp 14   Ht 5' 6 (1.676 m)   Wt 170 lb 9.6 oz (77.4 kg)   BMI 27.54 kg/m    Physical Exam Vitals and nursing note reviewed.  Constitutional:      Appearance: She is well-developed.  HENT:     Head: Normocephalic and atraumatic.  Eyes:     Conjunctiva/sclera: Conjunctivae normal.  Cardiovascular:     Rate and Rhythm: Normal rate and regular rhythm.     Heart sounds: Normal heart sounds.  Pulmonary:     Effort: Pulmonary effort is normal.     Breath sounds: Normal breath sounds.  Abdominal:     General: Bowel sounds are normal.     Palpations: Abdomen is soft.  Musculoskeletal:     Cervical back: Normal range of motion.  Lymphadenopathy:     Cervical: No cervical  adenopathy.  Skin:    General: Skin is warm and dry.     Capillary Refill: Capillary refill takes 2 to 3 seconds.  Neurological:     Mental Status: She is alert and oriented to person, place, and time.  Psychiatric:        Behavior: Behavior normal.      Musculoskeletal Exam: C-spine, thoracic spine, lumbar spine have good range of motion.  No midline spinal tenderness.  No SI joint tenderness.  Shoulder joints and elbow joints have good ROM.  Extensor tenosynovitis of the right wrist. Tenderness and inflammation noted in the right 1st and 2nd MCP joints. Complete fist formation bilaterally.  Hip joints have good range of motion with no groin pain.  Mild swelling of the left knee.   No warmth or effusion of the rightkn knee. Ankle joints have good range of motion no tenderness or joint swelling.  Plantar fasciitis of the left foot.    CDAI Exam: CDAI Score: -- Patient Global: --; Provider Global: -- Swollen: --; Tender: -- Joint Exam 07/17/2024   No joint exam has been documented for this visit   There is currently no information documented on the homunculus. Go to the Rheumatology activity and complete the homunculus joint exam.  Investigation: No additional findings.  Imaging: No results found.  Recent Labs: Lab Results  Component Value Date   WBC 4.0 04/11/2024   HGB 13.1 04/11/2024   PLT 306 04/11/2024   NA 137 04/11/2024   K 4.2 04/11/2024   CL 100 04/11/2024   CO2 29 04/11/2024   GLUCOSE 65 04/11/2024   BUN 12 04/11/2024   CREATININE 0.88 04/11/2024   BILITOT 0.6 04/11/2024   ALKPHOS 26 (L) 06/01/2020   AST 19 04/11/2024   ALT 17 04/11/2024   PROT 7.3 04/11/2024   ALBUMIN 3.8 06/01/2020   CALCIUM 9.5 04/11/2024   GFRAA 93 01/26/2021    Speciality Comments: PLQ Eye Exam: 12/20/2023 WNL @ [facility] (in [place]) Follow up in 1 year.   ANA 1:320 speckled, history of oral ulcers, lymphadenopathy, photosensitivity, Raynauds, erythema nodosum, inflammatory arthritis.  Procedures:  No procedures performed Allergies: Cromolyn  sodium, Avelox [moxifloxacin hcl in nacl], Cefaclor, Dust mite extract, Latex, Mold extract [trichophyton], Moxifloxacin, Red wine complex [germanium], Meloxicam , and Naproxen     Assessment / Plan:     Visit Diagnoses: Other systemic lupus erythematosus with other organ involvement (HCC) - ANA 1:320 speckled, history of oral ulcers, lymphadenopathy, photosensitivity, Raynauds, erythema nodosum, inflammatory arthritis: The patient has been taking Plaquenil  200 mg 1 tablet by mouth twice daily.  She has tolerated Plaquenil  without any side effects and has not had any gaps in therapy.  Lab work from  04/11/24 was reviewed today in the office: ESR WNL, dsDNA negative, complements WNL, no proteinura.  She presents today with extensor tenosynovitis affecting the right wrist.  She completed a prednisone  taper this past weekend but her symptoms have already started to recur.  Prior to taking the prednisone  taper she was experiencing other symptoms of a flare including a nasal ulcer, increased fatigue, and generalized itching.  She was also having inflammation and soreness in the right shoulder, left knee, and recurrence of plantar fasciitis affecting the left foot. Reviewed MRI right wrist 05/27/24: Mild tenosynovitis of the extensor digitorum tendons.   Different treatment options were discussed today in detail.  Reviewed indications, contraindications, potential side effects of methotrexate versus Imuran.  Patient was more comfortable with proceeding with Imuran--consent was obtained  and a handout of information was provided. Plan to obtain the following lab work today prior to initiating Imuran.  She will remain on Plaquenil  as combination therapy. She was advised to notify us  if she cannot tolerate taking Imuran.  She will follow-up in the office in 2 months or sooner if needed.   Baseline Immunosuppressant Therapy Labs TB GOLD: Pending    Hepatitis Panel-Hep B pending     Latest Ref Rng & Units 04/07/2021    8:59 AM  Hepatitis  Hep C Ab NON-REACTIVE NON-REACTIVE    HIV Lab Results  Component Value Date   HIV NON REACTIVE 10/17/2013   Immunoglobulins: Pending    SPEP: pending     Latest Ref Rng & Units 04/11/2024    8:54 AM  Serum Protein Electrophoresis  Total Protein 6.1 - 8.1 g/dL 7.3     TPMT Pending   Chest Xray:No active cardiopulmonary disease 09/21/23   Patient was counseled on the purpose, proper use, and adverse effects of azathioprine including risk of infection, nausea, rash, and hair loss.  Reviewed risk of cancer after long term use.  Discussed risk of myelosupression  and reviewed importance of frequent lab work to monitor blood counts.  Standing orders placed. Reviewed drug-drug interactions including contraindication with allopurinol.  Provided patient with educational materials on azathioprine and answered all questions.  Patient consented to azathioprine.  Will upload consent into the media tab.   Patient dose will be 50 mg daily. May consider increasing to 100 mg daily if tolerated and labs remain stable.  Prescription will be sent to pharmacy pending lab results and insurance approval.  High risk medication use - Plaquenil  200 mg 1 tablet by mouth twice daily. Plan to add on Imuran 50 mg 1 tablet by mouth daily.  PLQ Eye Exam: 12/20/2023 WNL @ [facility] (in [place]) Follow up in 1 year.  CBC and CMP updated on 04/11/24.   - Plan: Thiopurine methyltransferase(tpmt)rbc, QuantiFERON-TB Gold Plus, Serum protein electrophoresis with reflex, IgG, IgA, IgM, HIV Antibody (routine testing w rflx)  Screening-pulmonary TB -Plan to update TB gold.  Plan: QuantiFERON-TB Gold Plus  H/O immunosuppressive therapy - Plan to obtain the following lab work today prior to initiating imuran.   Plan: Thiopurine methyltransferase(tpmt)rbc, QuantiFERON-TB Gold Plus, Serum protein electrophoresis with reflex, IgG, IgA, IgM, HIV Antibody (routine testing w rflx)  Raynaud's phenomenon without gangrene: Intermittent symptoms of Raynaud's phenomenon.  Capillary refill 2 to 3 seconds today.  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   Chronic right shoulder pain - Dr. Jerri. MRI 08/09/2022: Moderate tendinosis supraspinatus tendon with partial-thickness bursal surface tear and partial-thickness articular surface tear.  Intermittent discomfort affecting the right shoulder.   Primary osteoarthritis of both hands: Extensor tenosynovitis of the right wrist noted today. Completed a prednisone  taper this past week.  Plan to add on imuran as discussed above.   Primary  osteoarthritis of both feet - Under the care of Dr. Afton to proceed with surgical intervention but does not currently have a surgical date scheduled.  Hypermobility of joint  Other fatigue: Chronic-improved while taking prednisone  taper.   Other medical conditions are listed as follows:   Terminal ileitis without complication (HCC)  S/P carpal tunnel release - -Bilateral--doing well.  Left performed by Dr. Jerri on 12/14/23.  Not currently symptomatic.  Chronic left SI joint pain  Attention deficit hyperactivity disorder (ADHD), predominantly inattentive type  Vitamin D  deficiency  History of hyperlipidemia  History of anxiety  History of  retinal tear  History of torn meniscus of left knee - Under the care of Dr. Burnetta.  Patient had a left knee joint aspiration and cortisone injection performed on 04/01/2024. She has some fullness.    Orders: Orders Placed This Encounter  Procedures   Thiopurine methyltransferase(tpmt)rbc   QuantiFERON-TB Gold Plus   Serum protein electrophoresis with reflex   IgG, IgA, IgM   HIV Antibody (routine testing w rflx)   No orders of the defined types were placed in this encounter.    Follow-Up Instructions: Return in about 2 months (around 09/16/2024) for Systemic lupus erythematosus.   Waddell CHRISTELLA Craze, PA-C  Note - This record has been created using Dragon software.  Chart creation errors have been sought, but may not always  have been located. Such creation errors do not reflect on  the standard of medical care.

## 2024-07-08 DIAGNOSIS — F902 Attention-deficit hyperactivity disorder, combined type: Secondary | ICD-10-CM | POA: Diagnosis not present

## 2024-07-08 DIAGNOSIS — F339 Major depressive disorder, recurrent, unspecified: Secondary | ICD-10-CM | POA: Diagnosis not present

## 2024-07-08 DIAGNOSIS — F411 Generalized anxiety disorder: Secondary | ICD-10-CM | POA: Diagnosis not present

## 2024-07-15 ENCOUNTER — Encounter: Payer: Self-pay | Admitting: Radiology

## 2024-07-17 ENCOUNTER — Telehealth: Payer: Self-pay | Admitting: Pharmacist

## 2024-07-17 ENCOUNTER — Encounter: Payer: Self-pay | Admitting: Physician Assistant

## 2024-07-17 ENCOUNTER — Other Ambulatory Visit: Payer: Self-pay | Admitting: Physician Assistant

## 2024-07-17 ENCOUNTER — Ambulatory Visit: Attending: Physician Assistant | Admitting: Physician Assistant

## 2024-07-17 VITALS — BP 113/77 | HR 72 | Temp 97.7°F | Resp 14 | Ht 66.0 in | Wt 170.6 lb

## 2024-07-17 DIAGNOSIS — Z79899 Other long term (current) drug therapy: Secondary | ICD-10-CM | POA: Diagnosis not present

## 2024-07-17 DIAGNOSIS — M19042 Primary osteoarthritis, left hand: Secondary | ICD-10-CM

## 2024-07-17 DIAGNOSIS — Z9225 Personal history of immunosupression therapy: Secondary | ICD-10-CM

## 2024-07-17 DIAGNOSIS — I7300 Raynaud's syndrome without gangrene: Secondary | ICD-10-CM

## 2024-07-17 DIAGNOSIS — M3219 Other organ or system involvement in systemic lupus erythematosus: Secondary | ICD-10-CM

## 2024-07-17 DIAGNOSIS — Z8639 Personal history of other endocrine, nutritional and metabolic disease: Secondary | ICD-10-CM

## 2024-07-17 DIAGNOSIS — E559 Vitamin D deficiency, unspecified: Secondary | ICD-10-CM

## 2024-07-17 DIAGNOSIS — M19041 Primary osteoarthritis, right hand: Secondary | ICD-10-CM

## 2024-07-17 DIAGNOSIS — F900 Attention-deficit hyperactivity disorder, predominantly inattentive type: Secondary | ICD-10-CM

## 2024-07-17 DIAGNOSIS — R5383 Other fatigue: Secondary | ICD-10-CM

## 2024-07-17 DIAGNOSIS — M25511 Pain in right shoulder: Secondary | ICD-10-CM

## 2024-07-17 DIAGNOSIS — Z87828 Personal history of other (healed) physical injury and trauma: Secondary | ICD-10-CM

## 2024-07-17 DIAGNOSIS — M533 Sacrococcygeal disorders, not elsewhere classified: Secondary | ICD-10-CM

## 2024-07-17 DIAGNOSIS — M249 Joint derangement, unspecified: Secondary | ICD-10-CM

## 2024-07-17 DIAGNOSIS — Z1159 Encounter for screening for other viral diseases: Secondary | ICD-10-CM | POA: Diagnosis not present

## 2024-07-17 DIAGNOSIS — Z111 Encounter for screening for respiratory tuberculosis: Secondary | ICD-10-CM

## 2024-07-17 DIAGNOSIS — L52 Erythema nodosum: Secondary | ICD-10-CM

## 2024-07-17 DIAGNOSIS — M19072 Primary osteoarthritis, left ankle and foot: Secondary | ICD-10-CM

## 2024-07-17 DIAGNOSIS — Z8659 Personal history of other mental and behavioral disorders: Secondary | ICD-10-CM

## 2024-07-17 DIAGNOSIS — Z8669 Personal history of other diseases of the nervous system and sense organs: Secondary | ICD-10-CM

## 2024-07-17 DIAGNOSIS — K5000 Crohn's disease of small intestine without complications: Secondary | ICD-10-CM

## 2024-07-17 DIAGNOSIS — Z9889 Other specified postprocedural states: Secondary | ICD-10-CM

## 2024-07-17 DIAGNOSIS — G8929 Other chronic pain: Secondary | ICD-10-CM

## 2024-07-17 DIAGNOSIS — M19071 Primary osteoarthritis, right ankle and foot: Secondary | ICD-10-CM

## 2024-07-17 NOTE — Telephone Encounter (Signed)
 Pending labs from today, patient will be azathioprine new start  Patient's dose will be azathioprine 50mg  daily.  Prescription pending labs results. Repeat CBC/CMP in 1 month (if she continues on azathioprine 50mg  daily). If she plans to increase to 100mg  daily, will need to repeat CBC/CMP before increasing dose   Cyprian Gongaware, PharmD, MPH, BCPS, CPP Clinical Pharmacist [facility])

## 2024-07-17 NOTE — Patient Instructions (Signed)
Azathioprine Tablets What is this medication? AZATHIOPRINE (ay za THYE oh preen) prevents the body from rejecting an organ transplant. It works by lowering the body's immune system response. This helps the body accept the donor organ. It may also be used to treat rheumatoid arthritis. This medicine may be used for other purposes; ask your health care provider or pharmacist if you have questions. COMMON BRAND NAME(S): Azasan, Imuran What should I tell my care team before I take this medication? They need to know if you have any of these conditions: Infection Kidney disease Liver disease An unusual or allergic reaction to azathioprine, lactose, other medications, foods, dyes, or preservatives Pregnant or trying to get pregnant Breastfeeding How should I use this medication? Take this medication by mouth with a full glass of water. Take it as directed on the prescription label at the same time every day. Keep taking it unless your care team tells you to stop. Keep taking it even if you think you are better. Talk to your care team about the use of this medication in children. Special care may be needed. Overdosage: If you think you have taken too much of this medicine contact a poison control center or emergency room at once. NOTE: This medicine is only for you. Do not share this medicine with others. What if I miss a dose? If you miss a dose, take it as soon as you can. If it is almost time for your next dose, take only that dose. Do not take double or extra doses. What may interact with this medication? Do not take this medication with any of the following: Febuxostat Mercaptopurine This medication may also interact with the following: Allopurinol Aminosalicylates, such as sulfasalazine, mesalamine, balsalazide, and olsalazine Leflunomide Medications called ACE inhibitors, such as benazepril, captopril, enalapril, fosinopril, quinapril, lisinopril, ramipril, and  trandolapril Mycophenolate Sulfamethoxazole; trimethoprim Vaccines Warfarin This list may not describe all possible interactions. Give your health care provider a list of all the medicines, herbs, non-prescription drugs, or dietary supplements you use. Also tell them if you smoke, drink alcohol, or use illegal drugs. Some items may interact with your medicine. What should I watch for while using this medication? Visit your care team for regular checks on your progress. You may need blood work done while you are taking this medication. This medication may increase your risk of getting an infection. Call your care team for advice if you get a fever, chills, sore throat, or other symptoms of a cold or flu. Do not treat yourself. Try to avoid being around people who are sick. Talk to your care team about your risk of cancer. You may be more at risk for certain types of cancer if you take this medication. Talk to your care team if you may be pregnant. This medication can cause serious birth defects if taken during pregnancy. This medication may cause infertility. Talk to your care team if you are concerned about your fertility. What side effects may I notice from receiving this medication? Side effects that you should report to your care team as soon as possible: Allergic reactions--skin rash, itching, hives, swelling of the face, lips, tongue, or throat Change in your skin, such as a new growth, a sore that doesn't heal, or a change in a mole Dizziness, loss of balance or coordination, confusion or trouble speaking Infection--fever, chills, cough, sore throat, wounds that don't heal, pain or trouble when passing urine, general feeling of discomfort or being unwell Low red blood cell level--unusual  weakness or fatigue, dizziness, headache, trouble breathing Unusual bruising or bleeding Side effects that usually do not require medical attention (report to your care team if they continue or are  bothersome): Diarrhea Fatigue Nausea Vomiting This list may not describe all possible side effects. Call your doctor for medical advice about side effects. You may report side effects to FDA at 1-800-FDA-1088. Where should I keep my medication? Keep out of the reach of children and pets. Store at room temperature between 15 and 25 degrees C (59 and 77 degrees F). Protect from light. Get rid of any unused medication after the expiration date. To get rid of medications that are no longer needed or have expired: Take the medication to a medication take-back program. Check with your pharmacy or law enforcement to find a location. If you cannot return the medication, check the label or package insert to see if the medication should be thrown out in the garbage or flushed down the toilet. If you are not sure, ask your care team. If it is safe to put it in the trash, empty the medication out of the container. Mix the medication with cat litter, dirt, coffee grounds, or other unwanted substance. Seal the mixture in a bag or container. Put it in the trash. NOTE: This sheet is a summary. It may not cover all possible information. If you have questions about this medicine, talk to your doctor, pharmacist, or health care provider.  2024 Elsevier/Gold Standard (2022-02-01 00:00:00)

## 2024-07-17 NOTE — Progress Notes (Signed)
 Pharmacy Note  Subjective: Patient presents today to [facility] for follow up office visit. Patient seen by the pharmacist for counseling on azathioprine (Imuran) for systemic lupus erythematosus.  Previous therapy includes:hydroxychloroquine  200mg  twice daily.  Objective: CMP     Component Value Date/Time   NA 137 04/11/2024 0854   NA 140 02/20/2020 1303   K 4.2 04/11/2024 0854   CL 100 04/11/2024 0854   CO2 29 04/11/2024 0854   GLUCOSE 65 04/11/2024 0854   BUN 12 04/11/2024 0854   BUN 11 02/20/2020 1303   CREATININE 0.88 04/11/2024 0854   CALCIUM 9.5 04/11/2024 0854   PROT 7.3 04/11/2024 0854   PROT 7.1 02/20/2020 1303   ALBUMIN 3.8 06/01/2020 1000   ALBUMIN 4.6 02/20/2020 1303   AST 19 04/11/2024 0854   ALT 17 04/11/2024 0854   ALKPHOS 26 (L) 06/01/2020 1000   BILITOT 0.6 04/11/2024 0854   BILITOT 0.4 02/20/2020 1303   GFRNONAA 80 01/26/2021 0000   GFRAA 93 01/26/2021 0000    CBC    Component Value Date/Time   WBC 4.0 04/11/2024 0854   RBC 4.24 04/11/2024 0854   HGB 13.1 04/11/2024 0854   HGB 13.8 04/19/2017 1214   HCT 41.0 04/11/2024 0854   HCT 42.0 04/19/2017 1214   PLT 306 04/11/2024 0854   PLT 254 04/19/2017 1214   MCV 96.7 04/11/2024 0854   MCV 93.5 02/20/2020 1239   MCV 91 04/19/2017 1214   MCH 30.9 04/11/2024 0854   MCHC 32.0 04/11/2024 0854   RDW 13.2 04/11/2024 0854   RDW 14.1 04/19/2017 1214   LYMPHSABS 1,373 05/30/2023 0832   LYMPHSABS 1.3 04/19/2017 1214   MONOABS 473 03/28/2017 1139   EOSABS 200 04/11/2024 0854   EOSABS 0.1 04/19/2017 1214   BASOSABS 28 04/11/2024 0854   BASOSABS 0.0 04/19/2017 1214    Baseline Immunosuppressant Therapy Labs TB GOLD   Hepatitis Panel    Latest Ref Rng & Units 04/07/2021    8:59 AM  Hepatitis  Hep C Ab NON-REACTIVE NON-REACTIVE    HIV Lab Results  Component Value Date   HIV NON REACTIVE 10/17/2013   Immunoglobulins   SPEP    Latest Ref Rng & Units 04/11/2024    8:54 AM  Serum  Protein Electrophoresis  Total Protein 6.1 - 8.1 g/dL 7.3    H3EI No results found for: G6PDH TPMT No results found for: TPMT   Chest x-ray: 09/20/2022 - no active cardiopulmonary disease  Assessment/Plan: Patient was counseled on the purpose, proper use, and adverse effects of azathioprine including risk of infection, nausea, rash, and hair loss. Reviewed risk of cancer after long term use.  Discussed risk of myelosupression and reviewed importance of frequent lab work to monitor blood counts.  Standing orders placed.  Reviewed drug-drug interactions including contraindication with allopurinol.  Provided patient with educational materials on azathioprine and answered all questions.  Patient consented to azathioprine.  Will upload consent into the media tab.   Patient dose will be azathioprine 50mg  daily.  Prescription pending labs results. Repeat CBC/CMP in 1 month (if she continues on azathioprine 50mg  daily). If she plans to increase to 100mg  daily, will need to repeat CBC/CMP before increasing dose   Continue hydroxychloroquine  200mg  twice daily.   Sherry Pennant, PharmD, MPH, BCPS, CPP Clinical Pharmacist [facility])

## 2024-07-18 ENCOUNTER — Ambulatory Visit: Payer: Self-pay | Admitting: Physician Assistant

## 2024-07-18 ENCOUNTER — Telehealth: Payer: Self-pay

## 2024-07-18 LAB — HEPATITIS B CORE ANTIBODY, IGM: Hep B C IgM: NONREACTIVE

## 2024-07-18 LAB — HEPATITIS B SURFACE ANTIGEN: Hepatitis B Surface Ag: NONREACTIVE

## 2024-07-18 NOTE — Progress Notes (Signed)
 Hep B surface antigen and core antibody negative

## 2024-07-18 NOTE — Telephone Encounter (Signed)
 Patient contacted the office and left a message to stating she needed to come to the office for labs and was confused because she had blood drawn yesterday while the system was down. Spoke to the Phlebotomist and she states the patient had more lab orders after the system came back up. Contacted the patient and patient states she will come back to the office tomorrow morning to have the rest of her labs drawn that are needed.

## 2024-07-23 ENCOUNTER — Encounter: Payer: Self-pay | Admitting: Allergy & Immunology

## 2024-07-23 NOTE — Telephone Encounter (Signed)
 Attempted to contact the patient to inquire when she will come to the office to have the additional lab work she needs to have drawn.

## 2024-07-24 NOTE — Telephone Encounter (Signed)
 Spoke with patient and she states she will be coming on 07/25/2024 to have labs drawn.

## 2024-07-25 DIAGNOSIS — M3219 Other organ or system involvement in systemic lupus erythematosus: Secondary | ICD-10-CM | POA: Diagnosis not present

## 2024-07-25 DIAGNOSIS — Z79899 Other long term (current) drug therapy: Secondary | ICD-10-CM | POA: Diagnosis not present

## 2024-07-25 DIAGNOSIS — Z9225 Personal history of immunosupression therapy: Secondary | ICD-10-CM | POA: Diagnosis not present

## 2024-07-25 DIAGNOSIS — Z111 Encounter for screening for respiratory tuberculosis: Secondary | ICD-10-CM | POA: Diagnosis not present

## 2024-07-28 ENCOUNTER — Ambulatory Visit: Payer: Self-pay | Admitting: Physician Assistant

## 2024-07-28 NOTE — Progress Notes (Signed)
 Immunoglobulins WNL SPEP normal  HIV negative

## 2024-07-29 NOTE — Progress Notes (Signed)
 TB gold negative

## 2024-07-30 MED ORDER — NALTREXONE HCL 50 MG PO TABS: ORAL_TABLET | ORAL | 2 refills | Status: AC

## 2024-07-30 NOTE — Telephone Encounter (Signed)
 When ordering low dose Naltrexone  6 mg apply this to note to pharmacy: Please compound Naltrexone  6 mg 1 tablet by mouth once daily. QTY: 120 tablets with 2 refills. Also put in text free sig: Take 1 tablet by mouth once daily.

## 2024-08-07 ENCOUNTER — Other Ambulatory Visit: Payer: Self-pay | Admitting: *Deleted

## 2024-08-07 MED ORDER — PREDNISONE 5 MG PO TABS: ORAL_TABLET | ORAL | 0 refills | Status: AC

## 2024-08-07 NOTE — Telephone Encounter (Signed)
 Please review and sign pended prednisone taper. Thanks!

## 2024-08-07 NOTE — Telephone Encounter (Signed)
 Attempted to contact patient and left message on machine to advise patient that prednisone  taper is being sent to the pharmacy. Advised patient to Take prednisone  in the morning with food. Avoid the use of NSAIDs.

## 2024-08-07 NOTE — Telephone Encounter (Signed)
 Patient contacted the office requesting a prescription for Prednisone  while she is waiting to start the Imuran. (TPMT still pending). Patient states she is having swelling in her right hand. Please advise.

## 2024-08-07 NOTE — Addendum Note (Signed)
 Addended by: YVONE DAVED BROCKS on: 08/07/2024 03:02 PM   Modules accepted: Orders

## 2024-08-07 NOTE — Telephone Encounter (Signed)
Ok to send in prednisone 20 mg tapering by 5 mg every 4 days. Take prednisone in the morning with food. Avoid the use of NSAIDs

## 2024-08-08 LAB — IGG, IGA, IGM
IgG (Immunoglobin G), Serum: 1056 mg/dL (ref 600–1640)
IgM, Serum: 90 mg/dL (ref 50–300)
Immunoglobulin A: 86 mg/dL (ref 47–310)

## 2024-08-08 LAB — PROTEIN ELECTROPHORESIS, SERUM, WITH REFLEX
Albumin ELP: 4.4 g/dL (ref 3.8–4.8)
Alpha 1: 0.3 g/dL (ref 0.2–0.3)
Alpha 2: 0.5 g/dL (ref 0.5–0.9)
Beta 2: 0.3 g/dL (ref 0.2–0.5)
Beta Globulin: 0.4 g/dL (ref 0.4–0.6)
Gamma Globulin: 0.9 g/dL (ref 0.8–1.7)
Total Protein: 6.7 g/dL (ref 6.1–8.1)

## 2024-08-08 LAB — THIOPURINE METHYLTRANSFERASE (TPMT), RBC: Thiopurine Methyltransferase, RBC: 7 nmol/h/mL — ABNORMAL LOW

## 2024-08-08 LAB — QUANTIFERON-TB GOLD PLUS
Mitogen-NIL: 7.77 [IU]/mL
NIL: 0.01 [IU]/mL
QuantiFERON-TB Gold Plus: NEGATIVE
TB1-NIL: 0 [IU]/mL
TB2-NIL: 0 [IU]/mL

## 2024-08-08 LAB — HIV ANTIBODY (ROUTINE TESTING W REFLEX)
HIV 1&2 Ab, 4th Generation: NONREACTIVE
HIV FINAL INTERPRETATION: NEGATIVE

## 2024-08-13 NOTE — Progress Notes (Signed)
 TPMT is low-she may not been a good candidate for imuran. We will need to discuss other options-possibly methotrexate or benlysta.

## 2024-08-13 NOTE — Telephone Encounter (Signed)
 TPMT is low-she may not been a good candidate for imuran. We will need to discuss other options-possibly methotrexate or benlysta.

## 2024-08-14 ENCOUNTER — Other Ambulatory Visit: Payer: Self-pay | Admitting: Physician Assistant

## 2024-08-14 DIAGNOSIS — M3219 Other organ or system involvement in systemic lupus erythematosus: Secondary | ICD-10-CM

## 2024-08-14 NOTE — Telephone Encounter (Signed)
 Last Fill: 05/10/2024  Eye exam: 12/20/2023 WNL    Labs: 04/11/2024 CBC and CMP WNL   Next Visit: 09/03/2024  Last Visit: 07/17/2024  IK:Nuyzm systemic lupus erythematosus with other organ involvement   Current Dose per office note 07/17/2024: Plaquenil  200 mg 1 tablet by mouth twice daily.   Okay to refill Plaquenil ?

## 2024-08-20 NOTE — Progress Notes (Unsigned)
 Office Visit Note  Patient: Robin [name]             Date of Birth: 06-Apr-1971           MRN: [id number]             PCP: Leonarda Roxan BROCKS, NP Referring: Leonarda Roxan BROCKS, NP Visit Date: 09/03/2024 Occupation: Data Unavailable  Subjective:  No chief complaint on file.   History of Present Illness: Robin [name] is a 53 y.o. female ***     Activities of Daily Living:  Patient reports morning stiffness for *** {minute/hour:19697}.   Patient {ACTIONS;DENIES/REPORTS:21021675::Denies} nocturnal pain.  Difficulty dressing/grooming: {ACTIONS;DENIES/REPORTS:21021675::Denies} Difficulty climbing stairs: {ACTIONS;DENIES/REPORTS:21021675::Denies} Difficulty getting out of chair: {ACTIONS;DENIES/REPORTS:21021675::Denies} Difficulty using hands for taps, buttons, cutlery, and/or writing: {ACTIONS;DENIES/REPORTS:21021675::Denies}  No Rheumatology ROS completed.   PMFS History:  Patient Active Problem List   Diagnosis Date Noted  . Partial nontraumatic tear of right rotator cuff 12/22/2022  . Left hamstring injury 07/15/2022  . Labral tear of shoulder, degenerative, right 06/21/2022  . Left carpal tunnel syndrome 05/26/2022  . Foot sprain, left, initial encounter 05/19/2022  . Baker's cyst of knee, left 04/21/2022  . Right carpal tunnel syndrome 03/16/2022  . Flushing 04/14/2021  . Hypermobility syndrome 11/27/2020  . Seasonal and perennial allergic rhinitis 11/06/2018  . Mild intermittent asthma without complication 11/06/2018  . Flexural atopic dermatitis 11/06/2018  . History of hyperlipidemia 11/08/2016  . History of anxiety 11/08/2016  . History of retinal tear 11/08/2016  . History of Bilateral plantar fasciitis 08/10/2016  . High risk medication use 07/04/2016  . Lupus (HCC) 05/23/2016  . 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HCC) 03/18/2016  . ADHD (attention deficit hyperactivity disorder) 12/22/2014  . Raynaud's phenomenon 10/17/2013  . Hyperlipidemia 09/28/2011     Past Medical History:  Diagnosis Date  . ADD (attention deficit disorder)   . Allergy    . Anemia   . Anorexia nervosa with bulimia (HCC)    Per [facility] New Patient Packet  . Anxiety   . Asthma   . Carpal tunnel syndrome    Per [facility] New Patient Packet  . Depression   . Dry eyes    Per [facility] New Patient Packet  . Dry mouth    Per [facility] New Patient Packet  . Fibroids    Per [facility] New Patient Packet  . Ganglion cyst    right hand  . History of hip x-ray 2021   Per [facility] New Patient Packet  . History of Papanicolaou smear of cervix 2021   Per [facility] New Patient Packet, Dr.Pickens  . Hyperlipidemia   . Ileitis   . Lupus   . PONV (postoperative nausea and vomiting)    mild nausea   . Raynaud disease   . Retinal tear    Per [facility] New Patient Packet  . Systemic lupus erythematosus (HCC)   . Torn meniscus    left    Family History  Problem Relation Age of Onset  . Heart disease Mother 37       CAD/stenting  . Hyperlipidemia Mother   . Arthritis Mother        ostearthritis  . Hypertension Mother   . Kidney disease Mother   . Irritable bowel syndrome Mother   . Stroke Mother 56       CVA  . Fibromyalgia Mother   . Eczema Mother   . Sjogren's syndrome Mother   . Hyperlipidemia Father   . Neuropathy Father   . Allergies Father  shellfish  . Prostatitis Father   . Hyperlipidemia Sister   . ADD / ADHD Sister   . Bipolar disorder Sister   . Depression Sister   . Hypertension Brother   . OCD Brother   . Anxiety disorder Daughter   . ADD / ADHD Daughter   . Asthma Daughter   . OCD Son   . Colon cancer Neg Hx   . Inflammatory bowel disease Neg Hx   . Allergic rhinitis Neg Hx   . Angioedema Neg Hx   . Atopy Neg Hx   . Immunodeficiency Neg Hx   . Urticaria Neg Hx    Past Surgical History:  Procedure Laterality Date  . ABLATION  2022  . CARPAL TUNNEL RELEASE Right    08/2019  . CARPAL TUNNEL RELEASE Left 12/14/2023  . CESAREAN SECTION  2003  . CESAREAN SECTION  2005    Per [facility] New Patient Packet  . COLONOSCOPY  2017   Dr.Jacobs  . cyst drained Left 2023   knee  . DENTAL SURGERY  2009   Fromed tooth and chronic infection  . DENTAL SURGERY  01/25/2024  . DILITATION & CURRETTAGE/HYSTROSCOPY WITH NOVASURE ABLATION N/A 06/03/2020   Procedure: DILATATION & CURETTAGE/HYSTEROSCOPY WITH NOVASURE ABLATION;  Surgeon: Izell Harari, MD;  Location: [facility];  Service: Gynecology;  Laterality: N/A;  . ENDOMETRIAL BIOPSY  04/20/2020      . EYE SURGERY Bilateral    cryoretinopexy  . hang nail procedure Left 2024   great toe   Social History   Tobacco Use  . Smoking status: Former    Current packs/day: 0.00    Average packs/day: 0.8 packs/day for 10.0 years (7.5 ttl pk-yrs)    Types: Cigarettes    Start date: 07/06/1991    Quit date: 07/05/2001    Years since quitting: 23.1    Passive exposure: Never  . Smokeless tobacco: Never  Vaping Use  . Vaping status: Never Used  Substance Use Topics  . Alcohol use: Yes    Comment: occ  . Drug use: Never   Social History   Social History Narrative   Marital status: married x 17 years; happily married      Children: 2 children (15, 64)      Lives: with husband, 2 children.      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 4 (including me)       Pets: 1 dog, 2 cats       Highest level or education completed: Undergraduate degree       Current/Past profession: Gaffer       Exercise:        Yes           Type and how often: 30-60 min walking  5 days a week. Pilate's 1 x a week, and horseback riding 2 times a week          Living Will: No   DNR: no, would like to discuss one    POA/HPOA: No      Functional Status:   Do you have difficulty bathing or dressing yourself? Left blank    Do you have difficulty preparing food or eating? Left blank   Do you have difficulty managing your medications? Left blank   Do you have difficulty managing your finances? Left blank   Do you have difficulty affording your medications? Left blank     Immunization History  Administered Date(s) Administered  . Influenza,inj,Quad PF,6+ Mos 05/12/2014, 11/27/2015, 05/23/2016, 10/30/2017, 07/30/2018  . Influenza-Unspecified 07/08/2022, 07/10/2023, 07/17/2024  . Moderna Covid-19 Fall Seasonal Vaccine 80yrs & older 07/17/2024  . Moderna Sars-Covid-2 Vaccination 10/27/2020, 10/31/2020  . PFIZER(Purple Top)SARS-COV-2 Vaccination 12/06/2019, 12/16/2019, 05/27/2020, 07/07/2022  . Pfizer(Comirnaty)Fall Seasonal Vaccine 12 years and older 07/10/2023  . Pneumococcal Conjugate-13 05/23/2016  . Tdap 09/28/2011, 08/12/2020  . Zoster Recombinant(Shingrix) 07/17/2024     Objective: Vital Signs: There were no vitals taken for this visit.   Physical Exam   Musculoskeletal Exam: ***  CDAI Exam: CDAI Score: -- Patient Global: --; Provider Global: -- Swollen: --; Tender: -- Joint Exam 09/03/2024   No joint exam has been documented for this visit   There is currently no information documented on the homunculus. Go to the Rheumatology activity and complete the homunculus joint exam.  Investigation: No additional findings.  Imaging: No results found.  Recent Labs: Lab Results  Component Value Date   WBC 4.0 04/11/2024   HGB 13.1 04/11/2024   PLT 306 04/11/2024   NA 137 04/11/2024   K 4.2 04/11/2024   CL 100 04/11/2024   CO2 29 04/11/2024   GLUCOSE 65 04/11/2024   BUN 12 04/11/2024   CREATININE 0.88  04/11/2024   BILITOT 0.6 04/11/2024   ALKPHOS 26 (L) 06/01/2020   AST 19 04/11/2024   ALT 17 04/11/2024   PROT 6.7 07/25/2024   ALBUMIN 3.8 06/01/2020   CALCIUM 9.5 04/11/2024   GFRAA 93 01/26/2021   QFTBGOLDPLUS NEGATIVE 07/25/2024    Speciality Comments: PLQ Eye Exam: 12/20/2023 WNL @ [facility] (in [place]) Follow up in 1 year.   ANA 1:320 speckled, history of oral ulcers, lymphadenopathy, photosensitivity, Raynauds, erythema nodosum, inflammatory arthritis.  Procedures:  No procedures performed Allergies: Cromolyn  sodium, Avelox [moxifloxacin hcl in nacl], Cefaclor, Dust mite extract, Latex, Mold extract [trichophyton], Moxifloxacin, Red wine complex [germanium], Meloxicam , and Naproxen    Assessment / Plan:     Visit Diagnoses: Other systemic lupus erythematosus with other organ involvement (HCC)  High risk medication use  Raynaud's phenomenon without gangrene  Erythema nodosum  Chronic right shoulder pain  Primary osteoarthritis of both hands  Primary osteoarthritis of both feet  Hypermobility of joint  Terminal ileitis without complication (HCC)  S/P carpal tunnel release  Chronic left SI joint pain  Attention deficit hyperactivity disorder (ADHD), predominantly inattentive type  Vitamin D  deficiency  History of hyperlipidemia  History of retinal tear  History of anxiety  History of torn meniscus of left knee  Other fatigue  Orders: No orders of the defined types were placed in this encounter.  No orders of the defined types were placed in this encounter.   Face-to-face time spent with patient was *** minutes. Greater than 50% of time was spent in counseling and coordination of  care.  Follow-Up Instructions: No follow-ups on file.   Waddell CHRISTELLA Craze, PA-C  Note - This record has been created using Dragon software.  Chart creation errors have been sought, but may not always  have been located. Such creation errors do not  reflect on  the standard of medical care.

## 2024-08-21 ENCOUNTER — Other Ambulatory Visit (HOSPITAL_COMMUNITY): Payer: Self-pay

## 2024-08-21 MED ORDER — AMPHETAMINE-DEXTROAMPHET ER 20 MG PO CP24
20.0000 mg | ORAL_CAPSULE | Freq: Every morning | ORAL | 0 refills | Status: AC
  Filled 2024-08-21: qty 30, 30d supply, fill #0

## 2024-08-27 ENCOUNTER — Ambulatory Visit: Admitting: Physician Assistant

## 2024-09-03 ENCOUNTER — Encounter: Payer: Self-pay | Admitting: Physician Assistant

## 2024-09-03 ENCOUNTER — Ambulatory Visit: Attending: Physician Assistant | Admitting: Physician Assistant

## 2024-09-03 VITALS — BP 102/66 | HR 72 | Temp 97.4°F | Resp 14 | Ht 66.0 in | Wt 167.6 lb

## 2024-09-03 DIAGNOSIS — M3219 Other organ or system involvement in systemic lupus erythematosus: Secondary | ICD-10-CM

## 2024-09-03 DIAGNOSIS — Z79899 Other long term (current) drug therapy: Secondary | ICD-10-CM

## 2024-09-03 DIAGNOSIS — M19071 Primary osteoarthritis, right ankle and foot: Secondary | ICD-10-CM | POA: Diagnosis not present

## 2024-09-03 DIAGNOSIS — L52 Erythema nodosum: Secondary | ICD-10-CM

## 2024-09-03 DIAGNOSIS — G8929 Other chronic pain: Secondary | ICD-10-CM

## 2024-09-03 DIAGNOSIS — Z87828 Personal history of other (healed) physical injury and trauma: Secondary | ICD-10-CM

## 2024-09-03 DIAGNOSIS — K5000 Crohn's disease of small intestine without complications: Secondary | ICD-10-CM | POA: Diagnosis not present

## 2024-09-03 DIAGNOSIS — M25511 Pain in right shoulder: Secondary | ICD-10-CM | POA: Diagnosis not present

## 2024-09-03 DIAGNOSIS — M249 Joint derangement, unspecified: Secondary | ICD-10-CM

## 2024-09-03 DIAGNOSIS — F900 Attention-deficit hyperactivity disorder, predominantly inattentive type: Secondary | ICD-10-CM | POA: Diagnosis not present

## 2024-09-03 DIAGNOSIS — M19041 Primary osteoarthritis, right hand: Secondary | ICD-10-CM

## 2024-09-03 DIAGNOSIS — M19072 Primary osteoarthritis, left ankle and foot: Secondary | ICD-10-CM

## 2024-09-03 DIAGNOSIS — E559 Vitamin D deficiency, unspecified: Secondary | ICD-10-CM

## 2024-09-03 DIAGNOSIS — Z8659 Personal history of other mental and behavioral disorders: Secondary | ICD-10-CM

## 2024-09-03 DIAGNOSIS — Z9889 Other specified postprocedural states: Secondary | ICD-10-CM

## 2024-09-03 DIAGNOSIS — M19042 Primary osteoarthritis, left hand: Secondary | ICD-10-CM

## 2024-09-03 DIAGNOSIS — M533 Sacrococcygeal disorders, not elsewhere classified: Secondary | ICD-10-CM | POA: Diagnosis not present

## 2024-09-03 DIAGNOSIS — Z8669 Personal history of other diseases of the nervous system and sense organs: Secondary | ICD-10-CM

## 2024-09-03 DIAGNOSIS — R5383 Other fatigue: Secondary | ICD-10-CM

## 2024-09-03 DIAGNOSIS — I7300 Raynaud's syndrome without gangrene: Secondary | ICD-10-CM

## 2024-09-03 DIAGNOSIS — Z8639 Personal history of other endocrine, nutritional and metabolic disease: Secondary | ICD-10-CM

## 2024-09-03 NOTE — Patient Instructions (Addendum)
 Methotrexate Injection What is this medication? METHOTREXATE (METH oh TREX ate) treats inflammatory conditions such as arthritis and psoriasis. It works by decreasing inflammation, which can reduce pain and prevent long-term injury to the joints and skin. It may also be used to treat some types of cancer. It works by slowing down the growth of cancer cells. This medicine may be used for other purposes; ask your health care provider or pharmacist if you have questions. What should I tell my care team before I take this medication? They need to know if you have any of these conditions: Fluid in the stomach area or lungs Frequently drink alcohol Infection or immune system problems Kidney disease Liver disease Low blood counts (white cells, platelets, or red blood cells) Lung disease Recent or ongoing radiation Recent or upcoming vaccine Stomach ulcers Ulcerative colitis An unusual or allergic reaction to methotrexate, other medications, foods, dyes, or preservatives Pregnant or trying to get pregnant Breastfeeding How should I use this medication? This medication is for infusion into a vein or for injection into muscle or into the spinal fluid (whichever applies). It is usually given in a hospital or clinic setting. In rare cases, you might get this medication at home. You will be taught how to give this medication. Use exactly as directed. Take your medication at regular intervals. Do not take your medication more often than directed. If this medication is used for arthritis or psoriasis, it should be taken weekly, NOT daily. It is important that you put your used needles and syringes in a special sharps container. Do not put them in a trash can. If you do not have a sharps container, call your pharmacist or care team to get one. Talk to your care team about the use of this medication in children. While this medication may be prescribed for children as young as 2 years for selected conditions,  precautions do apply. Overdosage: If you think you have taken too much of this medicine contact a poison control center or emergency room at once. NOTE: This medicine is only for you. Do not share this medicine with others. What if I miss a dose? It is important not to miss your dose. Call your care team if you are unable to keep an appointment. If you give yourself the medication, and you miss a dose, talk with your care team. Do not take double or extra doses. What may interact with this medication? Do not take this medication with any of the following: Acitretin Probenecid This medication may also interact with the following: Aspirin or aspirin-like medications Azathioprine Certain antibiotics, such as gentamicin, penicillin, tetracycline, vancomycin Certain medications that treat or prevent blood clots, such as warfarin, apixaban, dabigatran, rivaroxaban Certain medications for stomach problems, such as esomeprazole, omeprazole , pantoprazole Dapsone Hydroxychloroquine  Live virus vaccines Medications for viral infections, such as acyclovir, cidofovir, foscarnet, ganciclovir Mercaptopurine NSAIDs, medications for pain and inflammation, such as ibuprofen  or naproxen  Phenytoin Pyrimethamine Retinoids, such as isotretinoin or tretinoin Sulfonamides, such as sulfasalazine or trimethoprim; sulfamethoxazole Theophylline This list may not describe all possible interactions. Give your health care provider a list of all the medicines, herbs, non-prescription drugs, or dietary supplements you use. Also tell them if you smoke, drink alcohol, or use illegal drugs. Some items may interact with your medicine. What should I watch for while using this medication? This medication may make you feel generally unwell. This is not uncommon as chemotherapy can affect healthy cells as well as cancer cells. Report any side effects.  Continue your course of treatment even though you feel ill unless your care  team tells you to stop. Your condition will be monitored carefully while you are receiving this medication. Avoid alcoholic drinks. This medication can cause serious side effects. To reduce the risk, your care team may give you other medications to take before receiving this one. Be sure to follow the directions from your care team. This medication can make you more sensitive to the sun. Keep out of the sun. If you cannot avoid being in the sun, wear protective clothing and use sunscreen. Do not use sun lamps or tanning beds/booths. You may get drowsy or dizzy. Do not drive, use machinery, or do anything that needs mental alertness until you know how this medication affects you. Do not stand or sit up quickly, especially if you are an older patient. This reduces the risk of dizzy or fainting spells. You may need blood work while you are taking this medication. Call your care team for advice if you get a fever, chills or sore throat, or other symptoms of a cold or flu. Do not treat yourself. This medication decreases your body's ability to fight infections. Try to avoid being around people who are sick. This medication may increase your risk to bruise or bleed. Call your care team if you notice any unusual bleeding. Be careful brushing or flossing your teeth or using a toothpick because you may get an infection or bleed more easily. If you have any dental work done, tell your dentist you are receiving this medication Check with your care team if you get an attack of severe diarrhea, nausea and vomiting, or if you sweat a lot. The loss of too much body fluid can make it dangerous for you to take this medication. Talk to your care team about your risk of cancer. You may be more at risk for certain types of cancers if you take this medication. Do not become pregnant while taking this medication or for 6 months after stopping it. Women should inform their care team if they wish to become pregnant or think  they might be pregnant. Men should not father a child while taking this medication and for 3 months after stopping it. There is potential for serious harm to an unborn child. Talk to your care team for more information. Do not breast-feed an infant while taking this medication or for 1 week after stopping it. This medication may make it more difficult to get pregnant or father a child. Talk to your care team if you are concerned about your fertility. What side effects may I notice from receiving this medication? Side effects that you should report to your care team as soon as possible: Allergic reactions--skin rash, itching, hives, swelling of the face, lips, tongue, or throat Blood clot--pain, swelling, or warmth in the leg, shortness of breath, chest pain Dry cough, shortness of breath or trouble breathing Infection--fever, chills, cough, sore throat, wounds that don't heal, pain or trouble when passing urine, general feeling of discomfort or being unwell Kidney injury--decrease in the amount of urine, swelling of the ankles, hands, or feet Liver injury--right upper belly pain, loss of appetite, nausea, light-colored stool, dark yellow or brown urine, yellowing of the skin or eyes, unusual weakness or fatigue Low red blood cell count--unusual weakness or fatigue, dizziness, headache, trouble breathing Redness, blistering, peeling, or loosening of the skin, including inside the mouth Seizures Unusual bruising or bleeding Side effects that usually do not require medical  attention (report to your care team if they continue or are bothersome): Diarrhea Dizziness Hair loss Nausea Pain, redness, or swelling with sores inside the mouth or throat Vomiting This list may not describe all possible side effects. Call your doctor for medical advice about side effects. You may report side effects to FDA at 1-800-FDA-1088. Where should I keep my medication? This medication is given in a hospital or clinic.  It will not be stored at home. NOTE: This sheet is a summary. It may not cover all possible information. If you have questions about this medicine, talk to your doctor, pharmacist, or health care provider.  2024 [employer] (2023-02-01 00:00:00)Standing Labs We placed an order today for your standing lab work.   Please have your standing labs drawn in 2 weeks x2, 2 months, then every 3 months   Please have your labs drawn 2 weeks prior to your appointment so that the provider can discuss your lab results at your appointment, if possible.  Please note that you may see your imaging and lab results in MyChart before we have reviewed them. We will contact you once all results are reviewed. Please allow our office up to 72 hours to thoroughly review all of the results before contacting the office for clarification of your results.  WALK-IN LAB HOURS  Monday through Thursday from 8:00 am - 4:30 pm and Friday from 8:00 am-12:00 pm.  Patients with office visits requiring labs will be seen before walk-in labs.  You may encounter longer than normal wait times. Please allow additional time. Wait times may be shorter on  Monday and Thursday afternoons.  We do not book appointments for walk-in labs. We appreciate your patience and understanding with our staff.   Labs are drawn by [facility]. Please bring your co-pay at the time of your lab draw.  You may receive a bill from [facility] for your lab work.  Please note if you are on Hydroxychloroquine  and and an order has been placed for a Hydroxychloroquine  level,  you will need to have it drawn 4 hours or more after your last dose.  If you wish to have your labs drawn at another location, please call the office 24 hours in advance so we can fax the orders.  The office is located at [address], [place], [place] [address]   If you have any questions regarding directions or hours of operation,  please call [phone].   As a  reminder, please drink plenty of water prior to coming for your lab work. Thanks!

## 2024-09-04 LAB — CBC WITH DIFFERENTIAL/PLATELET
Absolute Lymphocytes: 1455 {cells}/uL (ref 850–3900)
Absolute Monocytes: 350 {cells}/uL (ref 200–950)
Basophils Absolute: 11 {cells}/uL (ref 0–200)
Basophils Relative: 0.3 %
Eosinophils Absolute: 61 {cells}/uL (ref 15–500)
Eosinophils Relative: 1.6 %
HCT: 40.7 % (ref 35.9–46.0)
Hemoglobin: 13.3 g/dL (ref 11.7–15.5)
MCH: 31.1 pg (ref 27.0–33.0)
MCHC: 32.7 g/dL (ref 31.6–35.4)
MCV: 95.1 fL (ref 81.4–101.7)
MPV: 9.5 fL (ref 7.5–12.5)
Monocytes Relative: 9.2 %
Neutro Abs: 1923 {cells}/uL (ref 1500–7800)
Neutrophils Relative %: 50.6 %
Platelets: 297 Thousand/uL (ref 140–400)
RBC: 4.28 Million/uL (ref 3.80–5.10)
RDW: 13.1 % (ref 11.0–15.0)
Total Lymphocyte: 38.3 %
WBC: 3.8 Thousand/uL (ref 3.8–10.8)

## 2024-09-04 LAB — COMPREHENSIVE METABOLIC PANEL WITH GFR
AG Ratio: 1.8 (calc) (ref 1.0–2.5)
ALT: 17 U/L (ref 6–29)
AST: 21 U/L (ref 10–35)
Albumin: 4.7 g/dL (ref 3.6–5.1)
Alkaline phosphatase (APISO): 36 U/L — ABNORMAL LOW (ref 37–153)
BUN: 18 mg/dL (ref 7–25)
CO2: 31 mmol/L (ref 20–32)
Calcium: 9.4 mg/dL (ref 8.6–10.4)
Chloride: 101 mmol/L (ref 98–110)
Creat: 0.99 mg/dL (ref 0.50–1.03)
Globulin: 2.6 g/dL (ref 1.9–3.7)
Glucose, Bld: 81 mg/dL (ref 65–99)
Potassium: 4.2 mmol/L (ref 3.5–5.3)
Sodium: 138 mmol/L (ref 135–146)
Total Bilirubin: 0.5 mg/dL (ref 0.2–1.2)
Total Protein: 7.3 g/dL (ref 6.1–8.1)
eGFR: 69 mL/min/1.73m2

## 2024-09-04 LAB — C3 AND C4
C3 Complement: 89 mg/dL (ref 83–193)
C4 Complement: 24 mg/dL (ref 15–57)

## 2024-09-04 LAB — PROTEIN / CREATININE RATIO, URINE
Creatinine, Urine: 27 mg/dL (ref 20–275)
Total Protein, Urine: <4 mg/dL — ABNORMAL LOW (ref 5–24)

## 2024-09-04 LAB — SEDIMENTATION RATE: Sed Rate: 2 mm/h (ref 0–30)

## 2024-09-04 LAB — ANTI-DNA ANTIBODY, DOUBLE-STRANDED: ds DNA Ab: 1 [IU]/mL

## 2024-09-05 ENCOUNTER — Ambulatory Visit: Payer: Self-pay | Admitting: Physician Assistant

## 2024-09-05 NOTE — Progress Notes (Signed)
 No proteinura  CBC WNL ESR WNL Complements WNL  Alk phos is borderline elevated, rest of CMP WNL-we will continue to monitor.   dsDNA is negative.  Labs are not consistent with a flare.     Ok to start methotrexate and folic acid as discussed at the last visit. Please pend prescriptions.

## 2024-09-11 ENCOUNTER — Other Ambulatory Visit: Payer: Self-pay | Admitting: Family

## 2024-09-11 ENCOUNTER — Other Ambulatory Visit: Payer: Self-pay | Admitting: *Deleted

## 2024-09-11 DIAGNOSIS — R062 Wheezing: Secondary | ICD-10-CM

## 2024-09-11 MED ORDER — ALBUTEROL SULFATE HFA 108 (90 BASE) MCG/ACT IN AERS: 2.0000 | INHALATION_SPRAY | Freq: Four times a day (QID) | RESPIRATORY_TRACT | 5 refills | Status: AC | PRN

## 2024-09-13 MED ORDER — "BD TB SYRINGE 27G X 1/2"" 1 ML MISC": 12.0000 | 3 refills | Status: AC

## 2024-09-13 MED ORDER — METHOTREXATE SODIUM CHEMO INJECTION 50 MG/2ML: INTRAMUSCULAR | 0 refills | Status: AC

## 2024-09-13 MED ORDER — FOLIC ACID 1 MG PO TABS: 2.0000 mg | ORAL_TABLET | Freq: Every day | ORAL | 3 refills | Status: AC

## 2024-09-19 ENCOUNTER — Ambulatory Visit: Admitting: Physician Assistant

## 2024-11-05 ENCOUNTER — Ambulatory Visit: Admitting: Physician Assistant

## 2025-02-06 ENCOUNTER — Ambulatory Visit: Admitting: Rheumatology
